# Patient Record
Sex: Female | Born: 1975
Health system: Southern US, Community
[De-identification: ages and names within clinical notes are randomized; demographics above are authoritative.]

## PROBLEM LIST (undated history)

## (undated) DIAGNOSIS — K219 Gastro-esophageal reflux disease without esophagitis: Secondary | ICD-10-CM

## (undated) DIAGNOSIS — Z9289 Personal history of other medical treatment: Secondary | ICD-10-CM

## (undated) DIAGNOSIS — M1990 Unspecified osteoarthritis, unspecified site: Secondary | ICD-10-CM

## (undated) DIAGNOSIS — I10 Essential (primary) hypertension: Secondary | ICD-10-CM

## (undated) DIAGNOSIS — F32A Depression, unspecified: Secondary | ICD-10-CM

## (undated) DIAGNOSIS — G4730 Sleep apnea, unspecified: Secondary | ICD-10-CM

## (undated) DIAGNOSIS — F329 Major depressive disorder, single episode, unspecified: Secondary | ICD-10-CM

## (undated) DIAGNOSIS — L508 Other urticaria: Secondary | ICD-10-CM

## (undated) DIAGNOSIS — N611 Abscess of the breast and nipple: Secondary | ICD-10-CM

## (undated) DIAGNOSIS — D219 Benign neoplasm of connective and other soft tissue, unspecified: Secondary | ICD-10-CM

## (undated) DIAGNOSIS — F419 Anxiety disorder, unspecified: Secondary | ICD-10-CM

## (undated) DIAGNOSIS — G43909 Migraine, unspecified, not intractable, without status migrainosus: Secondary | ICD-10-CM

## (undated) DIAGNOSIS — M069 Rheumatoid arthritis, unspecified: Secondary | ICD-10-CM

## (undated) DIAGNOSIS — K208 Other esophagitis: Secondary | ICD-10-CM

## (undated) DIAGNOSIS — H6690 Otitis media, unspecified, unspecified ear: Secondary | ICD-10-CM

## (undated) DIAGNOSIS — Z8489 Family history of other specified conditions: Secondary | ICD-10-CM

## (undated) HISTORY — PX: UTERINE FIBROID SURGERY: SHX826

## (undated) HISTORY — DX: Other esophagitis: K20.8

## (undated) HISTORY — DX: Other urticaria: L50.8

## (undated) HISTORY — PX: BREAST SURGERY: SHX581

## (undated) HISTORY — DX: Rheumatoid arthritis, unspecified: M06.9

## (undated) HISTORY — DX: Migraine, unspecified, not intractable, without status migrainosus: G43.909

## (undated) HISTORY — DX: Benign neoplasm of connective and other soft tissue, unspecified: D21.9

## (undated) HISTORY — PX: HERNIA REPAIR: SHX51

## (undated) HISTORY — DX: Unspecified osteoarthritis, unspecified site: M19.90

## (undated) HISTORY — DX: Abscess of the breast and nipple: N61.1

## (undated) HISTORY — DX: Otitis media, unspecified, unspecified ear: H66.90

---

## 1998-10-08 ENCOUNTER — Other Ambulatory Visit: Admission: RE | Admit: 1998-10-08 | Discharge: 1998-10-08 | Payer: Self-pay | Admitting: *Deleted

## 1999-11-01 ENCOUNTER — Other Ambulatory Visit: Admission: RE | Admit: 1999-11-01 | Discharge: 1999-11-01 | Payer: Self-pay | Admitting: *Deleted

## 2000-02-03 ENCOUNTER — Emergency Department (HOSPITAL_COMMUNITY): Admission: EM | Admit: 2000-02-03 | Discharge: 2000-02-04 | Payer: Self-pay | Admitting: Emergency Medicine

## 2000-11-15 ENCOUNTER — Other Ambulatory Visit: Admission: RE | Admit: 2000-11-15 | Discharge: 2000-11-15 | Payer: Self-pay | Admitting: *Deleted

## 2000-11-18 ENCOUNTER — Emergency Department (HOSPITAL_COMMUNITY): Admission: EM | Admit: 2000-11-18 | Discharge: 2000-11-18 | Payer: Self-pay | Admitting: Emergency Medicine

## 2000-11-29 ENCOUNTER — Encounter: Admission: RE | Admit: 2000-11-29 | Discharge: 2000-11-29 | Payer: Self-pay

## 2001-08-08 ENCOUNTER — Ambulatory Visit (HOSPITAL_BASED_OUTPATIENT_CLINIC_OR_DEPARTMENT_OTHER): Admission: RE | Admit: 2001-08-08 | Discharge: 2001-08-08 | Payer: Self-pay | Admitting: General Surgery

## 2001-09-18 HISTORY — PX: INCISION AND DRAINAGE BREAST ABSCESS: SUR672

## 2001-09-19 ENCOUNTER — Encounter: Payer: Self-pay | Admitting: General Surgery

## 2001-09-19 ENCOUNTER — Encounter: Admission: RE | Admit: 2001-09-19 | Discharge: 2001-09-19 | Payer: Self-pay | Admitting: General Surgery

## 2002-02-03 ENCOUNTER — Other Ambulatory Visit: Admission: RE | Admit: 2002-02-03 | Discharge: 2002-02-03 | Payer: Self-pay | Admitting: *Deleted

## 2003-02-09 ENCOUNTER — Other Ambulatory Visit: Admission: RE | Admit: 2003-02-09 | Discharge: 2003-02-09 | Payer: Self-pay | Admitting: *Deleted

## 2003-09-04 ENCOUNTER — Emergency Department (HOSPITAL_COMMUNITY): Admission: EM | Admit: 2003-09-04 | Discharge: 2003-09-04 | Payer: Self-pay | Admitting: Emergency Medicine

## 2004-04-29 ENCOUNTER — Ambulatory Visit (HOSPITAL_COMMUNITY): Admission: RE | Admit: 2004-04-29 | Discharge: 2004-04-29 | Payer: Self-pay | Admitting: *Deleted

## 2004-04-29 ENCOUNTER — Encounter (INDEPENDENT_AMBULATORY_CARE_PROVIDER_SITE_OTHER): Payer: Self-pay | Admitting: *Deleted

## 2004-11-01 ENCOUNTER — Ambulatory Visit: Payer: Self-pay | Admitting: Internal Medicine

## 2005-03-01 ENCOUNTER — Ambulatory Visit: Payer: Self-pay | Admitting: Internal Medicine

## 2005-04-14 ENCOUNTER — Ambulatory Visit: Payer: Self-pay | Admitting: Internal Medicine

## 2005-05-18 ENCOUNTER — Ambulatory Visit: Payer: Self-pay | Admitting: Internal Medicine

## 2005-05-24 ENCOUNTER — Encounter: Admission: RE | Admit: 2005-05-24 | Discharge: 2005-05-24 | Payer: Self-pay | Admitting: Orthopedic Surgery

## 2005-07-24 ENCOUNTER — Ambulatory Visit: Payer: Self-pay | Admitting: Internal Medicine

## 2005-09-05 ENCOUNTER — Ambulatory Visit: Payer: Self-pay | Admitting: Internal Medicine

## 2005-09-06 ENCOUNTER — Encounter: Admission: RE | Admit: 2005-09-06 | Discharge: 2005-09-06 | Payer: Self-pay | Admitting: Internal Medicine

## 2005-10-04 ENCOUNTER — Ambulatory Visit: Payer: Self-pay | Admitting: Internal Medicine

## 2005-10-30 ENCOUNTER — Ambulatory Visit: Payer: Self-pay | Admitting: Internal Medicine

## 2006-05-11 ENCOUNTER — Ambulatory Visit (HOSPITAL_COMMUNITY): Admission: RE | Admit: 2006-05-11 | Discharge: 2006-05-11 | Payer: Self-pay | Admitting: Obstetrics and Gynecology

## 2006-06-10 ENCOUNTER — Encounter: Admission: RE | Admit: 2006-06-10 | Discharge: 2006-06-10 | Payer: Self-pay | Admitting: Orthopedic Surgery

## 2006-08-21 ENCOUNTER — Ambulatory Visit: Payer: Self-pay | Admitting: Internal Medicine

## 2007-04-02 ENCOUNTER — Ambulatory Visit: Payer: Self-pay | Admitting: Internal Medicine

## 2007-07-24 ENCOUNTER — Ambulatory Visit: Payer: Self-pay | Admitting: Internal Medicine

## 2007-07-24 DIAGNOSIS — I10 Essential (primary) hypertension: Secondary | ICD-10-CM | POA: Insufficient documentation

## 2007-07-24 DIAGNOSIS — R51 Headache: Secondary | ICD-10-CM | POA: Insufficient documentation

## 2007-07-24 DIAGNOSIS — E669 Obesity, unspecified: Secondary | ICD-10-CM | POA: Insufficient documentation

## 2007-07-24 DIAGNOSIS — D259 Leiomyoma of uterus, unspecified: Secondary | ICD-10-CM | POA: Insufficient documentation

## 2007-07-24 DIAGNOSIS — R519 Headache, unspecified: Secondary | ICD-10-CM | POA: Insufficient documentation

## 2007-07-24 DIAGNOSIS — G4700 Insomnia, unspecified: Secondary | ICD-10-CM | POA: Insufficient documentation

## 2007-07-25 ENCOUNTER — Encounter: Payer: Self-pay | Admitting: Internal Medicine

## 2007-10-15 ENCOUNTER — Ambulatory Visit: Payer: Self-pay | Admitting: Internal Medicine

## 2007-10-15 DIAGNOSIS — N946 Dysmenorrhea, unspecified: Secondary | ICD-10-CM | POA: Insufficient documentation

## 2007-11-05 ENCOUNTER — Ambulatory Visit: Payer: Self-pay | Admitting: Internal Medicine

## 2007-11-10 LAB — CONVERTED CEMR LAB
BUN: 7 mg/dL (ref 6–23)
Calcium: 9.5 mg/dL (ref 8.4–10.5)

## 2007-11-13 ENCOUNTER — Ambulatory Visit: Payer: Self-pay | Admitting: Internal Medicine

## 2007-11-13 DIAGNOSIS — M5380 Other specified dorsopathies, site unspecified: Secondary | ICD-10-CM | POA: Insufficient documentation

## 2008-04-27 ENCOUNTER — Telehealth: Payer: Self-pay | Admitting: *Deleted

## 2008-04-27 ENCOUNTER — Ambulatory Visit: Payer: Self-pay | Admitting: Family Medicine

## 2008-05-11 ENCOUNTER — Ambulatory Visit: Payer: Self-pay | Admitting: Internal Medicine

## 2009-10-28 ENCOUNTER — Telehealth: Payer: Self-pay | Admitting: Internal Medicine

## 2009-10-29 ENCOUNTER — Ambulatory Visit: Payer: Self-pay | Admitting: Internal Medicine

## 2009-10-29 DIAGNOSIS — R042 Hemoptysis: Secondary | ICD-10-CM | POA: Insufficient documentation

## 2009-10-29 LAB — CONVERTED CEMR LAB
ALT: 14 units/L (ref 0–35)
AST: 20 units/L (ref 0–37)
Albumin: 3.3 g/dL — ABNORMAL LOW (ref 3.5–5.2)
Basophils Relative: 0 % (ref 0.0–3.0)
Bilirubin, Direct: 0.2 mg/dL (ref 0.0–0.3)
CO2: 28 meq/L (ref 19–32)
Calcium: 8.5 mg/dL (ref 8.4–10.5)
GFR calc non Af Amer: 91.99 mL/min (ref >60)
Glucose, Bld: 96 mg/dL (ref 70–99)
Lymphocytes Relative: 39 % (ref 12.0–46.0)
MCHC: 32.7 g/dL (ref 30.0–36.0)
MCV: 92 fL (ref 78.0–100.0)
Monocytes Relative: 16 % — ABNORMAL HIGH (ref 3.0–12.0)
Platelets: 322 10*3/uL (ref 150.0–400.0)
RDW: 12.7 % (ref 11.5–14.6)
WBC: 4.9 10*3/uL (ref 4.5–10.5)

## 2009-11-01 ENCOUNTER — Telehealth: Payer: Self-pay | Admitting: *Deleted

## 2009-11-08 ENCOUNTER — Telehealth: Payer: Self-pay | Admitting: Internal Medicine

## 2009-11-10 ENCOUNTER — Ambulatory Visit: Payer: Self-pay | Admitting: Internal Medicine

## 2009-11-10 DIAGNOSIS — J019 Acute sinusitis, unspecified: Secondary | ICD-10-CM | POA: Insufficient documentation

## 2010-02-12 ENCOUNTER — Emergency Department (HOSPITAL_COMMUNITY): Admission: EM | Admit: 2010-02-12 | Discharge: 2010-02-13 | Payer: Self-pay | Admitting: Emergency Medicine

## 2010-07-28 ENCOUNTER — Ambulatory Visit: Payer: Self-pay | Admitting: Internal Medicine

## 2010-07-28 DIAGNOSIS — R209 Unspecified disturbances of skin sensation: Secondary | ICD-10-CM | POA: Insufficient documentation

## 2010-07-28 DIAGNOSIS — R229 Localized swelling, mass and lump, unspecified: Secondary | ICD-10-CM | POA: Insufficient documentation

## 2010-08-09 ENCOUNTER — Encounter: Payer: Self-pay | Admitting: Internal Medicine

## 2010-10-20 NOTE — Assessment & Plan Note (Signed)
Summary: ? cyst on elbow?/dm   Vital Signs:  Patient profile:   35 year old female Menstrual status:  irregular LMP:     07/02/2010 Height:      64 inches Weight:      290 pounds BMI:     49.96 Pulse rate:   66 / minute BP sitting:   140 / 90  (right arm) Cuff size:   large  Vitals Entered By: Romualdo Bolk, CMA (AAMA) (July 28, 2010 8:09 AM) CC: Cyst on left elbow, starting to get painful. It has been growing and getting worse the past year. Pt states that if she rest on her elbow her arm gets numb from elbow down to fingers. LMP (date): 07/02/2010     Menstrual Status irregular Enter LMP: 07/02/2010   History of Present Illness: Gisele Pack comes in today  for  above problem , Onset months ago and slowly growing nodule that is painful in   left elbow region. Hurts to touch and also some radiating symptoms down arm and nubness at time in pinky and ring finger .  does tende to lean on that elbow and sleep in flexed positon.  NO trauma and is right  handed.    No other weakness or tingling  HT controlled  up  today from" stress" HAs   would need refill of imitrex    takes 2 of whatever dose ( given by HA specialist in past )    NO cp sob.    Preventive Screening-Counseling & Management  Alcohol-Tobacco     Alcohol drinks/day: <1     Smoking Status: never  Caffeine-Diet-Exercise     Caffeine use/day: 2     Does Patient Exercise: no  Current Medications (verified): 1)  Maxalt-Mlt 5 Mg  Tbdp (Rizatriptan Benzoate) .... As Needed Migraine 2)  Imitrex 25 Mg  Tabs (Sumatriptan Succinate) .... As Needed Migraines 3)  Doxepin Hcl 10 Mg  Caps (Doxepin Hcl) .... As Needed For Hives 4)  Rozerem 8 Mg  Tabs (Ramelteon) .[name].. 1 Hs  For Sleep 5)  Flexeril 10 Mg  Tabs (Cyclobenzaprine Hcl) .[name].. 1 By Mouth Three Times A Day As Needed 6)  Diovan Hct 160-25 Mg Tabs (Valsartan-Hydrochlorothiazide) .[name].. 1 By Mouth Once Daily  Allergies (verified): No Known Drug  Allergies  Past History:  Past medical, surgical, family and social histories (including risk factors) reviewed, and no changes noted (except as noted below).  Past Medical History: Reviewed history from 04/27/2008 and no changes required. recurrent breast abscess Headache dr Jarvis Morgan fibroids and bleeding hives hx of when gets hot Hypertension  Past History:  Care Management: Gynecology:  hx of HA clinic eval in past   Family History: Reviewed history from 07/24/2007 and no changes required. Family History of Arthritis Family History Hypertension Family History of Stroke M 1st degree relative <50  Social History: Reviewed history from 11/10/2009 and no changes required. Never Smoked Single no tobacco employed  travels some in car  Review of Systems  The patient denies anorexia, fever, weight loss, difficulty walking, abnormal bleeding, enlarged lymph nodes, and angioedema.         headaches stable     no new signs   Physical Exam  General:  Well-developed,well-nourished,in no acute distress; alert,appropriate and cooperative throughout examination Msk:  left elbow with 2 cm mobile tender  cytic feeling just above elbow  nealr lateral olecrenon  no redness   grip strength is normal but painful to grip no  atrophy and finger opposition strength is normal  Pulses:  pulses intact without delay   Extremities:  see above  Neurologic:  alert & oriented X3, strength normal in all extremities, and gait normal.   Skin:  turgor normal and color normal.   Cervical Nodes:  No lymphadenopathy noted Psych:  Oriented X3, good eye contact, not anxious appearing, and not depressed appearing.     Impression & Recommendations:  Problem # 1:  LOCALIZED SUPERFICIAL SWELLING MASS OR LUMP (ICD-782.2)  seems like skin cyst but tender without redness or infection and  not a sebaceous type   with radiating pain numbness to ulnar area  of hand .  consider nerve comporession.    Orders: Orthopedic Surgeon Referral (Ortho Surgeon)  Problem # 2:  PARESTHESIA (ICD-782.0)  seems ? ulnar nerve from elbow .  no weakness  on exam   Orders: Orthopedic Surgeon Referral (Ortho Surgeon)  Problem # 3:  HEADACHE (ICD-784.0) on going  ..control mostly by "2 imitrex but thinks it is 50 mg )  will rx for 100 mg and stick to one triptan at a time.   Her updated medication list for this problem includes:    Maxalt-mlt 5 Mg Tbdp (Rizatriptan benzoate) .[name]... As needed migraine    Imitrex 25 Mg Tabs (Sumatriptan succinate) .[name]... As needed migraines    Sumatriptan Succinate 100 Mg Tabs (Sumatriptan succinate) .[name]... 1 by mouth as needed ha and can repeat in 2-4 hours  Problem # 4:  HYPERTENSION (ICD-401.9) has been controlled  says up today because of stress . to monitor  Her updated medication list for this problem includes:    Diovan Hct 160-25 Mg Tabs (Valsartan-hydrochlorothiazide) .[name]... 1 by mouth once daily  Complete Medication List: 1)  Maxalt-mlt 5 Mg Tbdp (Rizatriptan benzoate) .... As needed migraine 2)  Imitrex 25 Mg Tabs (Sumatriptan succinate) .... As needed migraines 3)  Doxepin Hcl 10 Mg Caps (Doxepin hcl) .... As needed for hives 4)  Rozerem 8 Mg Tabs (Ramelteon) .[name].. 1 hs  for sleep 5)  Flexeril 10 Mg Tabs (Cyclobenzaprine hcl) .[name].. 1 by mouth three times a day as needed 6)  Diovan Hct 160-25 Mg Tabs (Valsartan-hydrochlorothiazide) .[name].. 1 by mouth once daily 7)  Sumatriptan Succinate 100 Mg Tabs (Sumatriptan succinate) .[name].. 1 by mouth as needed ha and can repeat in 2-4 hours  Patient Instructions: 1)  Will contact your about referral to hand specialist.  2)  In the meantime avoid trauma   and extreme flexion. 3)  Continue  monitoring your  Blood pressure  and call if increase  Prescriptions: SUMATRIPTAN SUCCINATE 100 MG TABS (SUMATRIPTAN SUCCINATE) 1 by mouth as needed HA and can repeat in 2-4 hours  #9 x 1   Entered and Authorized by:   Madelin Headings  MD   Signed by:   Madelin Headings MD on 07/28/2010   Method used:   [facility] [address] [id number]       [address]       [place], [place]  [address]       Ph: [phone]       Fax: [phone]   RxID:   [id number]    Orders Added: 1)  Est. Patient Level IV [27253] 2)  Orthopedic Surgeon Referral Gaylord Shih Surgeon]

## 2010-10-20 NOTE — Assessment & Plan Note (Signed)
Summary: PT ADV FLU-LIKE SXS // RS   Vital Signs:  Patient profile:   35 year old female Menstrual status:  regular LMP:     10/21/2009 Height:      64 inches Weight:      273 pounds BMI:     47.03 O2 Sat:      98 % on Room air Temp:     98.7 degrees F oral Pulse rate:   97 / minute BP sitting:   120 / 80  (right arm) Cuff size:   large  Vitals Entered By: Romualdo Bolk, CMA (AAMA) (October 29, 2009 8:10 AM)  O2 Flow:  Room air CC: Sob, body aches, coughing , chest pains due to coughing and fever that started on 2/8. LMP (date): 10/21/2009     Menstrual Status regular Enter LMP: 10/21/2009   History of Present Illness: Jennifer [name] comesin today for   acute onset of above symptom  Was sick  end of dec and then jan  and went to urgent care.  Was told she had the flu with   Fever and laryngitis   .   Better From  Jan4 .   This illness began 2 days ago.      with ur st    then fever  Feb 8 th. days ago and then 102 and 103.  sudden onset.    Some body aches and  chest hurts.  No tobacco  no asthma.  DOE.   Trying   ther flu and alka setlzer cold plus.  and Mucinex.   .   Coughed up some blood red  this am. no nosebleed.    Preventive Screening-Counseling & Management  Alcohol-Tobacco     Alcohol drinks/day: <1     Smoking Status: never  Caffeine-Diet-Exercise     Caffeine use/day: 2     Does Patient Exercise: no  Current Medications (verified): 1)  Maxalt-Mlt 5 Mg  Tbdp (Rizatriptan Benzoate) .... As Needed Migraine 2)  Imitrex 25 Mg  Tabs (Sumatriptan Succinate) .... As Needed Migraines 3)  Doxepin Hcl 10 Mg  Caps (Doxepin Hcl) .... As Needed For Hives 4)  Rozerem 8 Mg  Tabs (Ramelteon) .[name].. 1 Hs  For Sleep 5)  Flexeril 10 Mg  Tabs (Cyclobenzaprine Hcl) .[name].. 1 By Mouth Three Times A Day As Needed 6)  Diovan Hct 160-25 Mg Tabs (Valsartan-Hydrochlorothiazide) .[name].. 1 By Mouth Once Daily  Allergies (verified): No Known Drug Allergies  Past History:  Past  Medical History: Reviewed history from 04/27/2008 and no changes required. recurrent breast abscess Headache dr Jarvis Morgan fibroids and bleeding hives hx of when gets hot Hypertension  Past History:  Care Management: Gynecology: [facility]  Social History: Caffeine use/day:  2 Does Patient Exercise:  no  Review of Systems       The patient complains of anorexia, fever, hoarseness, chest pain, dyspnea on exertion, and hemoptysis.  The patient denies weight gain, vision loss, syncope, peripheral edema, prolonged cough, abdominal pain, melena, hematochezia, severe indigestion/heartburn, hematuria, muscle weakness, transient blindness, unusual weight change, abnormal bleeding, and enlarged lymph nodes.         gets  recurrent breast and axillary  cysts and infections  Physical Exam  General:  Well-developed,well-nourished,in no acute distress; alert,appropriate and cooperative throughout examination  hoarse and congested .  Head:  normocephalic and atraumatic.   Eyes:  vision grossly intact and pupils equal.   Ears:  R ear normal, L ear normal, and no external  deformities.   Nose:  no external deformity and no external erythema.  very congested  Mouth:  pharynx pink and moist.  minimal  erythema  Neck:  No deformities, masses, or tenderness noted. Lungs:  Normal respiratory effort, chest expands symmetrically. Lungs are clear to auscultation, no crackles or wheezes.no dullness.   Heart:  Normal rate and regular rhythm. S1 and S2 normal without gallop, murmur, click, rub or other extra sounds.no lifts.   Abdomen:  Bowel sounds positive,abdomen soft and non-tender without masses, organomegaly or   noted. Pulses:  pulses intact without delay   Neurologic:  non focal  Skin:  turgor normal, color normal, no ecchymoses, no petechiae, no purpura, and no ulcerations.   Cervical Nodes:  shoddy nodes  Psych:  Oriented X3, good eye contact, not anxious appearing, and not depressed appearing.      Impression & Recommendations:  Problem # 1:  HEMOPTYSIS UNSPECIFIED (ICD-786.30) poss upper airway but patient concerned about severity of her illness and "immune  system"   no unusual exposures otherwise .  get cxray today also.  Orders: TLB-CBC Platelet - w/Differential (85025-CBCD) TLB-BMP (Basic Metabolic Panel-BMET) (80048-METABOL) TLB-Hepatic/Liver Function Pnl (80076-HEPATIC) Venipuncture (14782) T-2 View CXR (71020TC)  Problem # 2:  FEVER (ICD-780.60) with RTI coughing illness   we are in the middle of a flu epidemic but she already"had the flu" in december and had the immunization.  Orders: TLB-CBC Platelet - w/Differential (85025-CBCD) TLB-BMP (Basic Metabolic Panel-BMET) (80048-METABOL) TLB-Hepatic/Liver Function Pnl (80076-HEPATIC) Venipuncture (95621) T-2 View CXR (71020TC)  Problem # 3:  HYPERTENSION (ICD-401.9)  Her updated medication list for this problem includes:    Diovan Hct 160-25 Mg Tabs (Valsartan-hydrochlorothiazide) .[name]... 1 by mouth once daily  Complete Medication List: 1)  Maxalt-mlt 5 Mg Tbdp (Rizatriptan benzoate) .... As needed migraine 2)  Imitrex 25 Mg Tabs (Sumatriptan succinate) .... As needed migraines 3)  Doxepin Hcl 10 Mg Caps (Doxepin hcl) .... As needed for hives 4)  Rozerem 8 Mg Tabs (Ramelteon) .[name].. 1 hs  for sleep 5)  Flexeril 10 Mg Tabs (Cyclobenzaprine hcl) .[name].. 1 by mouth three times a day as needed 6)  Diovan Hct 160-25 Mg Tabs (Valsartan-hydrochlorothiazide) .[name].. 1 by mouth once daily 7)  Hydromet 5-1.5 Mg/75ml Syrp (Hydrocodone-homatropine) .[name].. 1-2 tsp by mouth q4-6 hours as needed cough  Patient Instructions: 1)  You will be informed of lab results when available.  2)  if x ray nega tive continue symptom rx  3)  (If fever goes high tonight or  cough up blood then take the antibiotic ).)  can call the oncall service if progressively worse. Prescriptions: HYDROMET 5-1.5 MG/5ML SYRP (HYDROCODONE-HOMATROPINE) 1-2 tsp by mouth  q4-6 hours as needed cough  #6 oz x 0   Entered and Authorized by:   Madelin Headings MD   Signed by:   Madelin Headings MD on 10/29/2009   Method used:   Print then Give to Patient   RxID:   [id number]

## 2010-10-20 NOTE — Progress Notes (Signed)
Summary: ov  Phone Note Call from Patient   Caller: Patient Call For: Jennifer Headings MD Summary of Call: sick with croupy cough, body aches. Out-of town today, asking for appt tomorrow. Initial call taken by: Raechel Ache, RN,  October 28, 2009 11:08 AM  Follow-up for Phone Call        [facility] to call back for appt Follow-up by: Raechel Ache, RN,  October 28, 2009 11:08 AM  Additional Follow-up for Phone Call Additional follow up Details #1::        Scheduled for 2-11. Additional Follow-up by: Rudy Jew, RN,  October 29, 2009 8:35 AM

## 2010-10-20 NOTE — Assessment & Plan Note (Signed)
Summary: fever 100.6, and cough is unbearable/dm   Vital Signs:  Patient profile:   35 year old female Menstrual status:  regular LMP:     10/21/2009 Weight:      275 pounds O2 Sat:      98 % on Room air Temp:     98.4 degrees F oral Pulse rate:   86 / minute BP sitting:   110 / 70  (left arm) Cuff size:   large  Vitals Entered By: Romualdo Bolk, CMA (AAMA) (November 10, 2009 3:31 PM)  O2 Flow:  Room air CC: Fever going up and down, coughing bad. Slight diarrhea, decreased appitite. Left eye swollen pink and red on 2/21 and 22 but better today.  LMP (date): 10/21/2009     Enter LMP: 10/21/2009   History of Present Illness: Jennifer [name] comesin today for   because  getting worse again. cough never that much better  and then got fever again.   finished z pack as directed   clogged left nostril for 5 days and left eye  red and irritated at times recently .fever x 1 day or so . Coughin now until vomits a bit  no sob .  no cp otherwise.  No new rashes  uti nv  some loose stiool no blood.   Has never been well since New year ( see notes)    No new rashes   NOt taking doxepin now.  Preventive Screening-Counseling & Management  Alcohol-Tobacco     Alcohol drinks/day: <1     Smoking Status: never  Caffeine-Diet-Exercise     Caffeine use/day: 2     Does Patient Exercise: no  Current Medications (verified): 1)  Maxalt-Mlt 5 Mg  Tbdp (Rizatriptan Benzoate) .... As Needed Migraine 2)  Imitrex 25 Mg  Tabs (Sumatriptan Succinate) .... As Needed Migraines 3)  Doxepin Hcl 10 Mg  Caps (Doxepin Hcl) .... As Needed For Hives 4)  Rozerem 8 Mg  Tabs (Ramelteon) .[name].. 1 Hs  For Sleep 5)  Flexeril 10 Mg  Tabs (Cyclobenzaprine Hcl) .[name].. 1 By Mouth Three Times A Day As Needed 6)  Diovan Hct 160-25 Mg Tabs (Valsartan-Hydrochlorothiazide) .[name].. 1 By Mouth Once Daily 7)  Hydromet 5-1.5 Mg/98ml Syrp (Hydrocodone-Homatropine) .[name].. 1-2 Tsp By Mouth Q4-6 Hours As Needed Cough  Allergies  (verified): No Known Drug Allergies  Past History:  Past medical, surgical, family and social histories (including risk factors) reviewed, and no changes noted (except as noted below).  Past Medical History: Reviewed history from 04/27/2008 and no changes required. recurrent breast abscess Headache dr Jarvis Morgan fibroids and bleeding hives hx of when gets hot Hypertension  Past History:  Care Management: Gynecology: [facility]  Family History: Reviewed history from 07/24/2007 and no changes required. Family History of Arthritis Family History Hypertension Family History of Stroke M 1st degree relative <50  Social History: Reviewed history from 05/11/2008 and no changes required. Never Smoked Single no tobacco employed   Review of Systems       The patient complains of anorexia, fever, decreased hearing, hoarseness, prolonged cough, and headaches.  The patient denies chest pain, syncope, dyspnea on exertion, abdominal pain, hematochezia, severe indigestion/heartburn, hematuria, muscle weakness, transient blindness, difficulty walking, unusual weight change, abnormal bleeding, enlarged lymph nodes, and angioedema.    Physical Exam  General:  very congested hoarse in nad looks exhauseted  Head:  normocephalic and atraumatic.   Eyes:  no swelling minimal redness no dc  eoms nl  Ears:  R ear normal, L ear normal, and no external deformities.   Nose:  no external deformity and no external erythema.  very congested left more than right  slight tenderness left maxilla  Mouth:  midl erythema no lesion no edema Neck:  No deformities, masses, or tenderness noted. Lungs:  Normal respiratory effort, chest expands symmetrically. Lungs are clear to auscultation, no crackles or wheezes.no dullness.   Heart:  Normal rate and regular rhythm. S1 and S2 normal without gallop, murmur, click, rub or other extra sounds.no lifts.   Abdomen:  Bowel sounds positive,abdomen soft and non-tender without  masses, organomegaly or  noted. Pulses:  nl cap refill  Skin:  turgor normal, color normal, no ecchymoses, and no petechiae.   Cervical Nodes:  shoddy  Psych:  Oriented X3, not anxious appearing, and not depressed appearing.  tired    Impression & Recommendations:  Problem # 1:  SINUSITIS - ACUTE-NOS (ICD-461.9)  left maxillary sinusitis?    very prolonged   illness   last cxray and lab ok except some neurtopenia . Mono less likely as no adenopathy and predominant cough.      If not getting better consider sinus ct  and or further evaluation.   The following medications were removed from the medication list:    Zithromax Z-pak 250 Mg Tabs (Azithromycin) .[name]... Take as directed Her updated medication list for this problem includes:    Hydromet 5-1.5 Mg/67ml Syrp (Hydrocodone-homatropine) .[name]... 1-2 tsp by mouth q4-6 hours as needed cough    Levaquin 750 Mg Tabs (Levofloxacin) .[name]... 1 by mouth once daily for 7 days for sinusitis caution levaquin with doxepin qt interval.  Orders: Prescription Created Electronically [phone])  Problem # 2:  cough uri  prolonged waxing and waning over almost 2 months by her history .  .   chest x ray NAD done in the last few weeks.       Complete Medication List: 1)  Maxalt-mlt 5 Mg Tbdp (Rizatriptan benzoate) .... As needed migraine 2)  Imitrex 25 Mg Tabs (Sumatriptan succinate) .... As needed migraines 3)  Doxepin Hcl 10 Mg Caps (Doxepin hcl) .... As needed for hives 4)  Rozerem 8 Mg Tabs (Ramelteon) .[name].. 1 hs  for sleep 5)  Flexeril 10 Mg Tabs (Cyclobenzaprine hcl) .[name].. 1 by mouth three times a day as needed 6)  Diovan Hct 160-25 Mg Tabs (Valsartan-hydrochlorothiazide) .[name].. 1 by mouth once daily 7)  Hydromet 5-1.5 Mg/35ml Syrp (Hydrocodone-homatropine) .[name].. 1-2 tsp by mouth q4-6 hours as needed cough 8)  Levaquin 750 Mg Tabs (Levofloxacin) .[name].. 1 by mouth once daily for 7 days for sinusitis  Patient Instructions: 1)  afrin nose spray for 3 days left  side of nose and nasal saline . 2)  antibioic for sinusitis. 3)  Call  in 5 days about your progress or as needed.  we will then decide any more follow up if needed.  4)  fever should be gone in 48 hours .  Prescriptions: LEVAQUIN 750 MG TABS (LEVOFLOXACIN) 1 by mouth once daily for 7 days for sinusitis  #7 x 0   Entered and Authorized by:   Madelin Headings MD   Signed by:   Madelin Headings MD on 11/10/2009   Method used:   [facility]        [facility]  [address] [id number]       [address]       [place]  [place], [place]  [address]       Ph: [phone]       Fax: [phone]   RxID:   [id number]

## 2010-10-20 NOTE — Progress Notes (Signed)
Summary: still sick  Phone Note Call from Patient   Caller: Patient Call For: Madelin Headings MD Reason for Call: Insurance Question Summary of Call: Still sick with congestion, dry cough and sore chest- no fever. [facility] work [phone] Initial call taken by: Raechel Ache, RN,  November 08, 2009 8:49 AM  Follow-up for Phone Call        this could be  post viral    airway irritability. If no fever pain or wheezing  wait another week and if not better then rov. Ok to  refill hydrocodone cough med if wishes   Follow-up by: Madelin Headings MD,  November 08, 2009 12:23 PM  Additional Follow-up for Phone Call Additional follow up Details #1::        Rx Called In Additional Follow-up by: Raechel Ache, RN,  November 08, 2009 12:55 PM    Prescriptions: HYDROMET 5-1.5 MG/5ML SYRP (HYDROCODONE-HOMATROPINE) 1-2 tsp by mouth q4-6 hours as needed cough  #6 oz x 0   Entered by:   Raechel Ache, RN   Authorized by:   Madelin Headings MD   Signed by:   Raechel Ache, RN on 11/08/2009   Method used:   Historical   RxID:   [id number]

## 2010-10-20 NOTE — Progress Notes (Signed)
Summary: test results  Phone Note Call from Patient   Caller: Patient Call For: Madelin Headings MD Summary of Call: calling for test results Initial call taken by: Raechel Ache, RN,  November 01, 2009 8:29 AM  Follow-up for Phone Call        [facility] Follow-up by: Romualdo Bolk, CMA Duncan Dull),  November 01, 2009 2:09 PM  Additional Follow-up for Phone Call Additional follow up Details #1::        Pt aware of results. Additional Follow-up by: Romualdo Bolk, CMA (AAMA),  November 02, 2009 8:20 AM

## 2010-10-20 NOTE — Letter (Signed)
Summary: Out of Work  Adult nurse at [employer]  [address]   [place], [place] [address]   Phone: [phone]  Fax: [phone]    November 10, 2009   Employee:  Jennifer [name]    To Whom It May Concern:   For Medical reasons, please excuse the above named employee from work for the following dates:  Start:   Wendnesday  Nov 10 2008  End:   Monday Nov 15 2008  If you need additional information, please feel free to contact our office.         Sincerely,    Madelin Headings MD

## 2010-10-20 NOTE — Consult Note (Signed)
Summary: [facility]  [facility]   Imported By: Maryln Gottron 08/25/2010 12:13:44  _____________________________________________________________________  External Attachment:    Type:   Image     Comment:   External Document

## 2010-11-03 ENCOUNTER — Encounter: Payer: Self-pay | Admitting: Internal Medicine

## 2010-11-03 ENCOUNTER — Ambulatory Visit (INDEPENDENT_AMBULATORY_CARE_PROVIDER_SITE_OTHER): Payer: Commercial Managed Care - PPO | Admitting: Internal Medicine

## 2010-11-03 DIAGNOSIS — J370 Chronic laryngitis: Secondary | ICD-10-CM | POA: Insufficient documentation

## 2010-11-03 DIAGNOSIS — J309 Allergic rhinitis, unspecified: Secondary | ICD-10-CM | POA: Insufficient documentation

## 2010-11-03 DIAGNOSIS — L509 Urticaria, unspecified: Secondary | ICD-10-CM

## 2010-11-08 ENCOUNTER — Other Ambulatory Visit: Payer: Self-pay | Admitting: Otolaryngology

## 2010-11-08 DIAGNOSIS — R490 Dysphonia: Secondary | ICD-10-CM

## 2010-11-15 NOTE — Assessment & Plan Note (Signed)
Summary: laryngitis/cb   Primary Provider/Referring Provider:  Panosh  CC:  Acute visit-Laryngitis off and on x 6 months; chills, nose sores, and and bloody nose at times..  History of Present Illness: November 03, 2010- 34 yoF previously seen here in 2006 for urticaria and allergic rhinoconjunctivitis, then lost to f/u. She was skin tested, broadly positive. Those resultsare reviewed and entered in EMR.  She comes now concerned about recurrent laryngitis with loss of voice intermittently x 6 months. She doesn't recognize pattern or trigger and denies postnasal drip or acid reflux. Denies stridor, throat pain or dysphagia. Occasional raspy cough, not constant. Notices nasal congestion in the morning and aware of snoring. Lives alone, noting insomnia but not aware of sleep apnea. No hx of ENT surgery. Denies voice abuse.  Hives still occur occasionally, although she has been off BCPs which originally seemed to be the problem. Heat triggers.  Preventive Screening-Counseling & Management  Alcohol-Tobacco     Alcohol drinks/day: <1     Smoking Status: never  Current Medications (verified): 1)  Maxalt-Mlt 5 Mg  Tbdp (Rizatriptan Benzoate) .... As Needed Migraine 2)  Imitrex 25 Mg  Tabs (Sumatriptan Succinate) .... As Needed Migraines 3)  Doxepin Hcl 10 Mg  Caps (Doxepin Hcl) .... As Needed For Hives 4)  Rozerem 8 Mg  Tabs (Ramelteon) .[name].. 1 Hs  For Sleep 5)  Flexeril 10 Mg  Tabs (Cyclobenzaprine Hcl) .[name].. 1 By Mouth Three Times A Day As Needed 6)  Diovan Hct 160-25 Mg Tabs (Valsartan-Hydrochlorothiazide) .[name].. 1 By Mouth Once Daily 7)  Sumatriptan Succinate 100 Mg Tabs (Sumatriptan Succinate) .[name].. 1 By Mouth As Needed Ha and Can Repeat in 2-4 Hours  Allergies (verified): No Known Drug Allergies  Past History:  Family History: Last updated: 07/24/2007 Family History of Arthritis Family History Hypertension Family History of Stroke M 1st degree relative <50  Social History: Last  updated: 07/28/2010 Never Smoked Single no tobacco employed  travels some in car  Risk Factors: Alcohol Use: <1 (11/03/2010) Caffeine Use: 2 (07/28/2010) Exercise: no (07/28/2010)  Risk Factors: Smoking Status: never (11/03/2010)  Past Medical History: recurrent breast abscess Headache dr Jarvis Morgan fibroids and bleeding hives hx of when gets hot Hypertension Recurrent laryngitis  Past Surgical History: Abscess left breast 2003  Review of Systems      See HPI       The patient complains of nasal congestion/difficulty breathing through nose and rash.  The patient denies shortness of breath with activity, shortness of breath at rest, productive cough, non-productive cough, coughing up blood, chest pain, irregular heartbeats, acid heartburn, indigestion, loss of appetite, weight change, abdominal pain, difficulty swallowing, sore throat, tooth/dental problems, headaches, sneezing, ear ache, anxiety, depression, hand/feet swelling, joint stiffness or pain, change in color of mucus, and fever.    Vital Signs:  Patient profile:   35 year old female Menstrual status:  irregular Height:      64 inches Weight:      291.38 pounds BMI:     50.20 O2 Sat:      99 % on Room air Temp:     97.5 degrees F oral Pulse rate:   72 / minute BP sitting:   164 / 98  (right arm) Cuff size:   large  Vitals Entered By: Reynaldo Minium CMA (November 03, 2010 2:16 PM)  O2 Flow:  Room air CC: Acute visit-Laryngitis off and on x 6 months; chills, nose sores, and bloody nose at times.   Physical Exam  Additional Exam:  General: A/Ox3; pleasant and cooperative, NAD, obese SKIN: no rash, lesions NODES: no lymphadenopathy HEENT: [place]/AT, EOM- WNL, Conjuctivae- clear, PERRLA, TM-WNL, Nose- crusting, Throat- clear and wnl, hoarse, no stridor or erythema NECK: Supple w/ fair ROM, JVD- none, normal carotid impulses w/o bruits Thyroid- normal to palpation CHEST: Clear to P&A HEART: RRR, no m/g/r  heard ABDOMEN: Soft and nl; nml bowel sounds; no organomegaly or masses noted EAV:WUJW, nl pulses, no edema  NEURO: Grossly intact to observation      Impression & Recommendations:  Problem # 1:  CHRONIC LARYNGITIS (ICD-476.0)  She doesn't feel postnasal drip or reflux, but those are not exxcluded. We discussed indoor heat and nasal congestion with snoring. She lives alone and doesn't know about apnea. We will have her try nasal saline lavage and nasonex. It turns out she has ENT appointment with Dr Jenne Pane tomorrow, so he can look at her cords and perhaps solve  this. Intermittent hoarseness is less likely related to polyps, which she asked albout.   Problem # 2:  URTICARIA, RECURRENT (ICD-708.9) Still has intermittent chronic urticaria. Heat seems to be a trigger, or the most common one. She usually is controlled with doxepin, but finds that oversedating. We suggested nonsedating antihistamines now.  Other Orders: Consultation Level IV (11914)  Patient Instructions: 1)  Please schedule a follow-up appointment as needed. 2)  Keep appointment with Dr Jenne Pane. If he doesn't see anything wrong, then I will certainly see you back and work with you  3)  Try saline nasal rinse once or twice daily for a week or so- like with a Neti pot 4)  Try sample Nasonex nasal steroid spray 5)   1-2 sprays each nostril every night last bedtime 6)  Try otc antihistamine Allegra 180/ fexofenadine for the hives 7)

## 2010-11-15 NOTE — Miscellaneous (Signed)
Summary: Skin Test/[facility]  Skin Test/ HealthCare   Imported By: Sherian Rein 11/09/2010 11:58:19  _____________________________________________________________________  External Attachment:    Type:   Image     Comment:   External Document

## 2010-11-21 ENCOUNTER — Other Ambulatory Visit: Payer: Commercial Managed Care - PPO

## 2010-12-01 ENCOUNTER — Ambulatory Visit
Admission: RE | Admit: 2010-12-01 | Discharge: 2010-12-01 | Disposition: A | Discharge status: Home / Self Care | Payer: Commercial Managed Care - PPO | Source: Ambulatory Visit | Attending: Otolaryngology | Admitting: Otolaryngology

## 2010-12-01 DIAGNOSIS — R490 Dysphonia: Secondary | ICD-10-CM

## 2010-12-01 MED ORDER — IOHEXOL 300 MG/ML  SOLN
75.0000 mL | Freq: Once | INTRAMUSCULAR | Status: AC | PRN
  Administered 2010-12-01: 75 mL via INTRAVENOUS

## 2011-02-03 NOTE — Op Note (Signed)
[place]  Patient:    Jennifer [name], Jennifer [name] Visit Number: [id number] MRN: [id number]          Service Type: DSU Location: [facility] Attending Physician:  Caleen Essex Dictated by:   Chevis Pretty, M.D. Proc. Date: 08/08/01 Admit Date:  08/08/2001                             Operative Report  PREOPERATIVE DIAGNOSIS:  Left breast abscess.  POSTOPERATIVE DIAGNOSIS:  Left breast abscess.  OPERATION PERFORMED:  SURGEON:  Chevis Pretty, M.D.  ANESTHESIA:  General via LMA.  DESCRIPTION OF PROCEDURE:  After informed consent was obtained, the patient was brought to the operating room and placed in supine position on the operating table.  After adequate induction of general anesthesia, the patients left breast was prepped with Betadine and draped in the usual sterile manner.  An 18 gauge needle and syringe were used to localize the abscess and once this was done, an incision was made over top of the abscess with a 15 blade knife.  This incision was carried down through the skin and subcutaneous tissue using the Bovie electrocautery until the abscess cavity was entered.  Cultures were then taken.  A fair amount of purulent material was able to be expressed.  The wound was probed with a hemostat to break up any loculations.  Once this was complete, the wound was packed to clean it up. Once this was done, the packing was removed and any bleeding points in the cavity were coagulated with the Bovie electrocautery.  The wound was then packed with a sterile piece of gauze and sterile dressings were applied.  The patient tolerated the procedure well.  Sponge, needle and instrument counts were correct at the end of the case.  The patient was awakened and taken to the recovery room in stable condition. Dictated by:   Chevis Pretty, M.D. Attending Physician:  Caleen Essex DD:  08/08/01 TD:  08/08/01 Job: [id number]

## 2011-02-03 NOTE — Op Note (Signed)
NAME:  Jennifer, [name]                          ACCOUNT NO.:  [id number]   MEDICAL RECORD NO.:  [id number]                   PATIENT TYPE:  AMB   LOCATION:  DAY                                  FACILITY:  [facility]   PHYSICIAN:  Pershing Cox, M.D.            DATE OF BIRTH:  [birth year]   DATE OF PROCEDURE:  04/29/2004  DATE OF DISCHARGE:                                 OPERATIVE REPORT   PREOPERATIVE DIAGNOSES:  Persistent menorrhagia and endometrial polyp on  hydrosonogram.   POSTOPERATIVE DIAGNOSIS:  Large endometrial cavity and sessile endometrial  polyp.   ANESTHESIA:  General endotracheal.   SURGEON:  Pershing Cox, M.D.   INDICATIONS FOR PROCEDURE:  Jennifer [name] is a 35 year old female, who has  been followed in my office for some time with complaints of menorrhagia.  In  evaluation of this problem, we performed a sonogram in April 2005.  This  showed multiple small uterine myomas and a thickened endometrium.  For this  reason, she was brought back for hydrosonogram and on this procedure, she  was found to have an endometrial polyp.  It was felt that there were  probably two endometrial polyps, both approximately 1.5 cm in size laying on  the anterior and posterior uterine wall.  For this reason, the patient is  brought to the operating room today for planned resection of endometrial  polyps.   OPERATIVE FINDINGS:  Exam under anesthesia shows no masses.  The uterus is  about 8 weeks in size.  Irregularities could not be discerned on this exam.  The endometrial cavity sounded to 9.5 cm.  The cavity was easily distended  but hard to maintain distension.  Both ostia were seen.  There was a single  sessile polyp which was easily removed from the endometrial cavity.  There  were moderate curettings.   DESCRIPTION OF PROCEDURE:  Jennifer [name] was brought to the operating room  with an IV in place.  She had received 1 g of Ancef in the holding area.  Supine on the OR  table, general endotracheal anesthesia was administered by  intubation.  She was then placed into Allen stirrups, and the perineum,  vagina, lower abdomen, and thighs were prepped with a solution of Hibiclens.  Red rubber catheter was used to sterilely empty the bladder.  The patient  was draped for a sterile vaginal procedure with a collecting drape beneath  her hips in order to measure the effluent from her procedure.  Bivalve  speculum was inserted into the vagina, cervix was visualized, and 0.25%  Marcaine was injected into the cervix for a paracervical block.  The cervix  was grasped with a single-tooth tenaculum.  By injection sites for the  paracervical were 3, 4, 7, and 8 using total 18 mL of 0.25% Marcaine.  Kevorkian curette was used to obtain endocervical curettings.  The uterine  sound passed to 9.5 cm.  Serial Pratt dilators were used to dilate the  cervix to size 31 Pratt, and the resectoscope was introduced.   Using through-and-through sorbitol irrigation, the cavity was distended and  visualized.  The sessile polyp was noted and retrieved with the  resectoscope.  The cavity was then carefully photographed so that both ostia  and all the walls could be seen.  There were no other polyps and evidence of  submucosal myoma.  The resectoscope was removed, and a large, sharp curette  was used to curette the walls, collecting the fragment onto a Telfa.  These  were submitted  with the polyp as endometrial curettings and endometrial polyp.  There was  minimal bleeding at the end of the procedure.  It should be noted that in  using the sharp curette, I did not feel any irregularities to suggest a  submucosal fibroid.                                               Pershing Cox, M.D.    MAJ/MEDQ  D:  04/29/2004  T:  04/29/2004  Job:  [id number]

## 2012-01-12 ENCOUNTER — Emergency Department (HOSPITAL_COMMUNITY): Payer: Commercial Managed Care - PPO

## 2012-01-12 ENCOUNTER — Emergency Department (HOSPITAL_COMMUNITY)
Admission: EM | Admit: 2012-01-12 | Discharge: 2012-01-12 | Disposition: A | Discharge status: Home / Self Care | Payer: Commercial Managed Care - PPO | Attending: Emergency Medicine | Admitting: Emergency Medicine

## 2012-01-12 ENCOUNTER — Telehealth: Payer: Self-pay | Admitting: *Deleted

## 2012-01-12 ENCOUNTER — Encounter (HOSPITAL_COMMUNITY): Payer: Self-pay | Admitting: *Deleted

## 2012-01-12 DIAGNOSIS — R0789 Other chest pain: Secondary | ICD-10-CM

## 2012-01-12 DIAGNOSIS — I10 Essential (primary) hypertension: Secondary | ICD-10-CM | POA: Insufficient documentation

## 2012-01-12 DIAGNOSIS — R071 Chest pain on breathing: Secondary | ICD-10-CM | POA: Insufficient documentation

## 2012-01-12 HISTORY — DX: Essential (primary) hypertension: I10

## 2012-01-12 LAB — D-DIMER, QUANTITATIVE: D-Dimer, Quant: 0.46 ug/mL-FEU (ref 0.00–0.48)

## 2012-01-12 LAB — POCT I-STAT, CHEM 8
Calcium, Ion: 1.2 mmol/L (ref 1.12–1.32)
Glucose, Bld: 92 mg/dL (ref 70–99)
Hemoglobin: 14.3 g/dL (ref 12.0–15.0)
Potassium: 3.8 mEq/L (ref 3.5–5.1)
TCO2: 23 mmol/L (ref 0–100)

## 2012-01-12 LAB — POCT I-STAT TROPONIN I

## 2012-01-12 MED ORDER — IBUPROFEN 800 MG PO TABS
800.0000 mg | ORAL_TABLET | Freq: Once | ORAL | Status: AC
  Administered 2012-01-12: 800 mg via ORAL
  Filled 2012-01-12: qty 1

## 2012-01-12 MED ORDER — ONDANSETRON 4 MG PO TBDP
8.0000 mg | ORAL_TABLET | Freq: Once | ORAL | Status: AC
  Administered 2012-01-12: 8 mg via ORAL
  Filled 2012-01-12: qty 2

## 2012-01-12 MED ORDER — HYDROCODONE-ACETAMINOPHEN 5-325 MG PO TABS: 1.0000 | ORAL_TABLET | ORAL | Status: AC | PRN

## 2012-01-12 NOTE — Telephone Encounter (Signed)
Pt called stating that she is having unrelenting chest pain, and is in the car driving.  Will go straight to [facility] ER for evaluation and treatment.  If she has to stop, will call 911.

## 2012-01-12 NOTE — ED Provider Notes (Signed)
Complains of right-sided parasternal pleuritic in quality, nonradiating chest pain onset 3 days ago initially intermittent, constant since 4 AM today worse with raising her arms above her head patient had shortness of breath and nausea yesterday which have resolved no other associated symptoms cardiac risk factors none. On exam alert nontoxic lungs clear to auscultation heart regular rate rhythm with right parasternal area is exquisitely tender pain is reproduced by forcible flexion of right shoulder Assessment doubt acute coronary syndrome i.e. highly atypical symptoms. Low pretest clinical probability for pulmonary of wisdom Physical exam is most consistent with musculoskeletal chest pain  Doug Sou, MD 01/12/12 1030

## 2012-01-12 NOTE — ED Provider Notes (Signed)
History     CSN: [id number]  Arrival date & time 01/12/12  4098   First MD Initiated Contact with Patient 01/12/12 0915      Chief Complaint  Patient presents with  . Chest Pain    (Consider location/radiation/quality/duration/timing/severity/associated sxs/prior treatment) HPI  Past Medical History  Diagnosis Date  . Hypertension     History reviewed. No pertinent past surgical history.  History reviewed. No pertinent family history.  History  Substance Use Topics  . Smoking status: Never Smoker   . Smokeless tobacco: Not on file  . Alcohol Use: Yes     occ    OB History    Grav Para Term Preterm Abortions TAB SAB Ect Mult Living                  Review of Systems  Allergies  Review of patient's allergies indicates no known allergies.  Home Medications  No current outpatient prescriptions on file.  BP 172/127  Pulse 74  Temp(Src) 98.4 F (36.9 C) (Oral)  Resp 16  SpO2 100%  Physical Exam  ED Course  Procedures (including critical care time)  Labs Reviewed - No data to display No results found.   No diagnosis found.    MDM  Duplicate note        Doug Sou, MD 01/12/12 [phone]

## 2012-01-12 NOTE — ED Provider Notes (Signed)
Medical screening examination/treatment/procedure(s) were conducted as a shared visit with non-physician practitioner(s) and myself.  I personally evaluated the patient during the encounter  Doug Sou, MD 01/12/12 [phone]

## 2012-01-12 NOTE — ED Provider Notes (Signed)
History     CSN: [id number]  Arrival date & time 01/12/12  4696   First MD Initiated Contact with Patient 01/12/12 0915      Chief Complaint  Patient presents with  . Chest Pain    (Consider location/radiation/quality/duration/timing/severity/associated sxs/prior treatment) HPI Comments: Jennifer [name] presents with a two-day history of right-sided chest pain which she describes as sharp and nonradiating.  It has been constant, but worse at night and also worse with palpation.  She did notice shortness of breath yesterday when she was walking to her car from her job site, but has had no further shortness of breath.  She denies weakness or numbness in her upper extremities, denies fevers or chills, no cough or congestion.  Patient is a 36 y.o. female presenting with chest pain. The history is provided by the patient.  Chest Pain Pertinent negatives for primary symptoms include no fever, no shortness of breath, no abdominal pain, no nausea and no dizziness.  Pertinent negatives for associated symptoms include no numbness and no weakness.     Past Medical History  Diagnosis Date  . Hypertension     History reviewed. No pertinent past surgical history.  History reviewed. No pertinent family history.  History  Substance Use Topics  . Smoking status: Never Smoker   . Smokeless tobacco: Not on file  . Alcohol Use: Yes     occ    OB History    Grav Para Term Preterm Abortions TAB SAB Ect Mult Living                  Review of Systems  Constitutional: Negative for fever.  HENT: Negative for congestion, sore throat and neck pain.   Eyes: Negative.   Respiratory: Negative for chest tightness and shortness of breath.   Cardiovascular: Positive for chest pain.  Gastrointestinal: Negative for nausea and abdominal pain.  Genitourinary: Negative.   Musculoskeletal: Negative for joint swelling and arthralgias.  Skin: Negative.  Negative for rash and wound.  Neurological:  Negative for dizziness, weakness, light-headedness, numbness and headaches.  Hematological: Negative.   Psychiatric/Behavioral: Negative.     Allergies  Review of patient's allergies indicates no known allergies.  Home Medications   Current Outpatient Rx  Name Route Sig Dispense Refill  . HYDROCODONE-ACETAMINOPHEN 5-325 MG PO TABS Oral Take 1 tablet by mouth every 4 (four) hours as needed for pain. 15 tablet 0    BP 148/99  Pulse 70  Temp(Src) 98.4 F (36.9 C) (Oral)  Resp 18  SpO2 99%  LMP 12/24/2011  Physical Exam  Nursing note and vitals reviewed. Constitutional: She appears well-developed and well-nourished.  HENT:  Head: Normocephalic and atraumatic.  Eyes: Conjunctivae are normal.  Neck: Normal range of motion.  Cardiovascular: Normal rate, regular rhythm, normal heart sounds and intact distal pulses.   Pulmonary/Chest: Effort normal and breath sounds normal. No respiratory distress. She has no wheezes. She has no rales. She exhibits tenderness. Right breast exhibits tenderness. Right breast exhibits no mass and no skin change.         Tender to palpation along the right anterior chest wall and upper breast.  No lesions, nodules, rash.    Abdominal: Soft. Bowel sounds are normal. There is no tenderness.  Musculoskeletal: Normal range of motion.  Neurological: She is alert.  Skin: Skin is warm and dry.  Psychiatric: She has a normal mood and affect.    ED Course  Procedures (including critical care time)   Labs  Reviewed  D-DIMER, QUANTITATIVE  POCT I-STAT, CHEM 8  POCT I-STAT TROPONIN I   Dg Chest 2 View  01/12/2012  *RADIOLOGY REPORT*  Clinical Data: Chest pain.  CHEST - 2 VIEW  Comparison: Multiple priors, most recently 02/12/2010.  Findings: Lung volumes are normal.  No consolidative airspace disease.  No pleural effusions.  No pneumothorax.  No pulmonary nodule or mass noted.  Pulmonary vasculature and the cardiomediastinal silhouette are within normal  limits.  IMPRESSION: 1. No radiographic evidence of acute cardiopulmonary disease.  Original Report Authenticated By: Florencia Reasons, M.D.     1. Chest wall pain       MDM  Palpable right upper chest wall pain.  Labs reviewed prior to discharge, x-ray and EKG also reviewed.  Prescribed hydrocodone, encouraged heat therapy for pain.  Follow up with PCP if not improving over the next several days.   Date: 01/12/2012  Rate: 73  Rhythm: normal sinus rhythm  QRS Axis: normal  Intervals: normal  ST/T Wave abnormalities: normal  Conduction Disutrbances:none  Narrative Interpretation:   Old EKG Reviewed: Right ventricular conduction delay seen on EKG dated 02/12/2010 not seen today.          Burgess Amor, PA 01/12/12 1136  Burgess Amor, PA 01/12/12 1137

## 2012-01-12 NOTE — Discharge Instructions (Signed)
Chest Wall Pain Chest wall pain is pain in or around the bones and muscles of your chest. It may take up to 6 weeks to get better. It may take longer if you must stay physically active in your work and activities.  CAUSES  Chest wall pain may happen on its own. However, it may be caused by:  A viral illness like the flu.   Injury.   Coughing.   Exercise.   Arthritis.   Fibromyalgia.   Shingles.  HOME CARE INSTRUCTIONS   Avoid overtiring physical activity. Try not to strain or perform activities that cause pain. This includes any activities using your chest or your abdominal and side muscles, especially if heavy weights are used.   Put ice on the sore area.   Put ice in a plastic bag.   Place a towel between your skin and the bag.   Leave the ice on for 15 to 20 minutes per hour while awake for the first 2 days.   Only take over-the-counter or prescription medicines for pain, discomfort, or fever as directed by your caregiver.  SEEK IMMEDIATE MEDICAL CARE IF:   Your pain increases, or you are very uncomfortable.   You have a fever.   Your chest pain becomes worse.   You have new, unexplained symptoms.   You have nausea or vomiting.   You feel sweaty or lightheaded.   You have a cough with phlegm (sputum), or you cough up blood.  MAKE SURE YOU:   Understand these instructions.   Will watch your condition.   Will get help right away if you are not doing well or get worse.  Document Released: 09/04/2005 Document Revised: 08/24/2011 Document Reviewed: 05/01/2011 [facility] Patient Information 2012 [place], [place].   Use the medication prescribed for pain, drowsy, do not drive within 4 hours of taking.  You may also benefit from a heating pad applied to the chest 15 minutes several times daily.  Follow up with your primary doctor if not improving over the next several days.

## 2012-01-12 NOTE — ED Notes (Signed)
Pt reports several day hx of right sided intermittent chest pain non radiating, states worse at night, reports being woke up at 4am with sharp right sided pain associated with nausea.

## 2012-01-12 NOTE — ED Notes (Signed)
Pt states she was taken off her BP medication

## 2012-06-17 ENCOUNTER — Other Ambulatory Visit: Payer: Self-pay | Admitting: Neurology

## 2012-06-17 DIAGNOSIS — Q8500 Neurofibromatosis, unspecified: Secondary | ICD-10-CM

## 2012-06-24 ENCOUNTER — Other Ambulatory Visit: Payer: Commercial Managed Care - PPO

## 2012-07-01 ENCOUNTER — Ambulatory Visit
Admission: RE | Admit: 2012-07-01 | Discharge: 2012-07-01 | Disposition: A | Discharge status: Home / Self Care | Payer: Commercial Managed Care - PPO | Source: Ambulatory Visit | Attending: Neurology | Admitting: Neurology

## 2012-07-01 DIAGNOSIS — Q8500 Neurofibromatosis, unspecified: Secondary | ICD-10-CM

## 2012-07-01 MED ORDER — GADOBENATE DIMEGLUMINE 529 MG/ML IV SOLN
20.0000 mL | Freq: Once | INTRAVENOUS | Status: AC | PRN
  Administered 2012-07-01: 20 mL via INTRAVENOUS

## 2012-07-24 ENCOUNTER — Encounter: Payer: Self-pay | Admitting: Internal Medicine

## 2012-07-24 ENCOUNTER — Encounter: Payer: Commercial Managed Care - PPO | Admitting: Internal Medicine

## 2012-07-24 NOTE — Progress Notes (Deleted)
No chief complaint on file.   HPI: Last visit was  NOVember 2011 ROS: See pertinent positives and negatives per HPI.  Past Medical History  Diagnosis Date  . Hypertension     No family history on file.  History   Social History  . Marital Status: Single    Spouse Name: N/A    Number of Children: N/A  . Years of Education: N/A   Social History Main Topics  . Smoking status: Never Smoker   . Smokeless tobacco: Not on file  . Alcohol Use: Yes     Comment: occ  . Drug Use:   . Sexually Active:    Other Topics Concern  . Not on file   Social History Narrative  . No narrative on file    No current outpatient prescriptions on file.  EXAM:  There were no vitals filed for this visit.  There is no height or weight on file to calculate BMI.  GENERAL: vitals reviewed and listed above, alert, oriented, appears well hydrated and in no acute distress  HEENT: atraumatic, conjunttiva clear, no obvious abnormalities on inspection of external nose and ears OP : no lesin edema or exudate   NECK: no obvious masses on inspection palpation   LUNGS: clear to auscultation bilaterally, no wheezes, rales or rhonchi, good air movement  CV: HRRR, no clubbing cyanosis or  peripheral edema nl cap refill   MS: moves all extremities without noticeable focal  abnormality  PSYCH: pleasant and cooperative, no obvious depression or anxiety  ASSESSMENT AND PLAN:  Discussed the following assessment and plan:  No diagnosis found.  -Patient advised to return or notify a doctor immediately if symptoms worsen or persist or new concerns arise.  There are no Patient Instructions on file for this visit.   Lorretta Harp

## 2012-08-21 ENCOUNTER — Ambulatory Visit (INDEPENDENT_AMBULATORY_CARE_PROVIDER_SITE_OTHER): Payer: Commercial Managed Care - PPO | Admitting: Family Medicine

## 2012-08-21 ENCOUNTER — Telehealth: Payer: Self-pay

## 2012-08-21 ENCOUNTER — Encounter: Payer: Self-pay | Admitting: Family Medicine

## 2012-08-21 VITALS — BP 138/90 | HR 96 | Temp 97.9°F | Wt 294.0 lb

## 2012-08-21 DIAGNOSIS — Z23 Encounter for immunization: Secondary | ICD-10-CM

## 2012-08-21 DIAGNOSIS — J069 Acute upper respiratory infection, unspecified: Secondary | ICD-10-CM

## 2012-08-21 MED ORDER — GUAIFENESIN-CODEINE 100-10 MG/5ML PO SYRP: 5.0000 mL | ORAL_SOLUTION | Freq: Three times a day (TID) | ORAL | Status: DC | PRN

## 2012-08-21 MED ORDER — FLUTICASONE PROPIONATE 50 MCG/ACT NA SUSP: 2.0000 | Freq: Every day | NASAL | Status: DC

## 2012-08-21 NOTE — Patient Instructions (Addendum)

## 2012-08-21 NOTE — Telephone Encounter (Signed)
Pt will see Dr. Selena Batten on 08/28/12

## 2012-08-21 NOTE — Telephone Encounter (Signed)
Pt was seen in the office this morning and states she needs a refill on her Diovan.  Pls advise.

## 2012-08-21 NOTE — Telephone Encounter (Signed)
Misty called and spoke with pt and advised that pt needed a new patient appt to be seen to have bp meds filled. Pt is aware.

## 2012-08-21 NOTE — Progress Notes (Signed)
Chief Complaint  Patient presents with  . Cough    chills, sore throat, body aches, hoarseness, fever of 101.1 yesterday     HPI: -started: 2 days ago -symptoms:nasal congestion, sore throat, cough, temp 100.1, body aches, lots of mucus in throat - felt like pill got stuck in throat last night -denies:fever, SOB, NVD, tooth pain, strep, flu or mono exposure -has tried: colleague at work sick -sick contacts: tried tylenol flu and cold -Hx of: denies hx of asthma or allergy -she is worried about having the flu, did not have flu vaccine   ROS: See pertinent positives and negatives per HPI.  Past Medical History  Diagnosis Date  . Hypertension   . Breast abscess of female     Recurrent  . Headache   . Fibroids     w/bleeding    No family history on file.  History   Social History  . Marital Status: Single    Spouse Name: N/A    Number of Children: N/A  . Years of Education: N/A   Social History Main Topics  . Smoking status: Never Smoker   . Smokeless tobacco: None  . Alcohol Use: Yes     Comment: occ  . Drug Use:   . Sexually Active:    Other Topics Concern  . None   Social History Narrative  . None    Current outpatient prescriptions:doxepin (SINEQUAN) 10 MG capsule, Take 10 mg by mouth., Disp: , Rfl: ;  naproxen sodium (ANAPROX) 550 MG tablet, Take 550 mg by mouth 2 (two) times daily with a meal., Disp: , Rfl: ;  triamterene-hydrochlorothiazide (MAXZIDE-25) 37.5-25 MG per tablet, Take 1 tablet by mouth daily., Disp: , Rfl: ;  valsartan (DIOVAN) 160 MG tablet, Take 160 mg by mouth daily., Disp: , Rfl:  fluticasone (FLONASE) 50 MCG/ACT nasal spray, Place 2 sprays into the nose daily., Disp: 16 g, Rfl: 6;  guaiFENesin-codeine (CHERATUSSIN AC) 100-10 MG/5ML syrup, Take 5 mLs by mouth 3 (three) times daily as needed for cough., Disp: 120 mL, Rfl: 0  EXAM:  Filed Vitals:   08/21/12 0802  BP: 138/90  Pulse: 96  Temp: 97.9 F (36.6 C)    There is no height on  file to calculate BMI.  GENERAL: vitals reviewed and listed above, alert, oriented, appears well hydrated and in no acute distress  HEENT: atraumatic, conjunttiva clear, no obvious abnormalities on inspection of external nose and ears, normal appearance of ear canals and TMs, clear nasal congestion, mild post oropharyngeal erythema with PND, no 2+ tonsillar edema, no exudate, no sinus TTP  NECK: no obvious masses on inspection  LUNGS: clear to auscultation bilaterally, no wheezes, rales or rhonchi, good air movement  CV: HRRR, no peripheral edema  MS: moves all extremities without noticeable abnormality  PSYCH: pleasant and cooperative, no obvious depression or anxiety  ASSESSMENT AND PLAN:  Discussed the following assessment and plan:  1. Viral upper respiratory illness  guaiFENesin-codeine (CHERATUSSIN AC) 100-10 MG/5ML syrup, fluticasone (FLONASE) 50 MCG/ACT nasal spray   -afebrile, well appearing on exam, rapid flu neg -flu vaccine given -Patient advised to return or notify a doctor immediately if symptoms worsen or persist or new concerns arise.  Patient Instructions  INSTRUCTIONS FOR UPPER RESPIRATORY INFECTION:  -plenty of rest and fluids  -nasal saline wash 2-3 times daily (use prepackaged nasal saline or bottled/distilled water if making your own)   -clean nose with nasal saline before using the nasal steroid or sinex  -can use  sinex nasal spray for drainage and nasal congestion - but do NOT use longer then 3-4 days  -can use tylenol or ibuprofen as directed for aches and sorethroat  -in the winter time, using a humidifier at night is helpful (please follow cleaning instructions)  -if you are taking a cough medication - use only as directed, may also try a teaspoon of honey to coat the throat and throat lozenges  -for sore throat, salt water gargles can help  -follow up if you have fevers, facial pain, tooth pain, difficulty breathing or are worsening or not  getting better in 5-7 days      KIM, HANNAH R.

## 2012-08-21 NOTE — Telephone Encounter (Signed)
error 

## 2012-08-22 ENCOUNTER — Ambulatory Visit (INDEPENDENT_AMBULATORY_CARE_PROVIDER_SITE_OTHER): Payer: Commercial Managed Care - PPO | Admitting: Internal Medicine

## 2012-08-22 ENCOUNTER — Encounter: Payer: Self-pay | Admitting: Internal Medicine

## 2012-08-22 ENCOUNTER — Telehealth: Payer: Self-pay | Admitting: Internal Medicine

## 2012-08-22 VITALS — BP 160/90 | HR 95 | Temp 97.4°F | Wt 295.0 lb

## 2012-08-22 DIAGNOSIS — H6691 Otitis media, unspecified, right ear: Secondary | ICD-10-CM

## 2012-08-22 DIAGNOSIS — I10 Essential (primary) hypertension: Secondary | ICD-10-CM

## 2012-08-22 DIAGNOSIS — H6690 Otitis media, unspecified, unspecified ear: Secondary | ICD-10-CM

## 2012-08-22 DIAGNOSIS — M069 Rheumatoid arthritis, unspecified: Secondary | ICD-10-CM

## 2012-08-22 MED ORDER — AMOXICILLIN 500 MG PO CAPS: 500.0000 mg | ORAL_CAPSULE | Freq: Three times a day (TID) | ORAL | Status: DC

## 2012-08-22 MED ORDER — LISINOPRIL-HYDROCHLOROTHIAZIDE 20-12.5 MG PO TABS: 1.0000 | ORAL_TABLET | Freq: Every day | ORAL | Status: DC

## 2012-08-22 NOTE — Telephone Encounter (Signed)
[facility] Triage Call Report Triage Record Num: [id number] Operator: Candida Peeling Patient Name: Jennifer [name] Call Date & Time: 08/21/2012 10:33:49PM Patient Phone: [phone] PCP: Kriste Basque Patient Gender: Female PCP Fax : Patient DOB: Nov 07, 1975 [facility] Reason for Call: Caller: [name]/Patient; PCP: Kriste Basque (Family Practice); CB#: [phone]; Call regarding Pt was seen this morning but feels worse now. Chills, ear ache...; Onset 08/21/12 seen in office today, told she had mucous buildup, given cough med and Flonase, tested for flu and given flu vaccine. Onset 08/21/12 at approx 1800 began having chills, right ear began feeling full and now is throbbing. Temp has not measured but oral temp now at 2240 is 98.3, having chills. Has not taken any meds today. LMP just started 08/21/12. Emergent sxs r/o per [facility]:[id number] protocol w/ the exception of "Constant or intermittent dull earache, throbbing in ear(s) or feeling of fullness; may interfere with sleep or ability to carry out normal activities". Care advice given. Appt scheduled on 08/22/12 at 1000 with Dr Fabian Sharp. Protocol(s) Used: Ear: Symptoms Recommended Outcome per Protocol: See Provider within 24 hours Reason for Outcome: Constant or intermittent dull earache, throbbing in ear(s) or feeling of fullness; may interfere with sleep or ability to carry out normal activities Care Advice: A warm washcloth or heating pad set on low to the affected ear may help relieve the discomfort. May apply for 15 to 20 minutes, 3 to 4 times a day. ~ ~ Do not use eardrops unless directed by a healthcare provider, especially if having ear drainage. Resting or sleeping with head elevated, such as semi-reclining in a recliner, may help reduce inner ear pressure and discomfort. ~ Keep ear canal as dry as possible. Take a bath instead of showering. Try to keep water out of ear when washing hair by placing cotton balls in ear  opening. Avoid swimming or water sports until okayed by provider. ~ Analgesic/Antipyretic Advice - NSAIDs: Consider aspirin, ibuprofen, naproxen or ketoprofen for pain or fever as directed on label or by pharmacist/provider. PRECAUTIONS: - If over 61 years of age, should not take longer than 1 week without consulting provider. EXCEPTIONS: - Should not be used if taking blood thinners or have bleeding problems. - Do not use if have history of sensitivity/allergy to any of these medications; or history of cardiovascular, ulcer, kidney, liver disease or diabetes unless approved by provider. - Do not exceed recommended dose or frequency. ~ Speak with provider as soon as possible if having: - discharge from ear that does not look like earwax or that has bad odor - increased redness or swelling of external ear - worsening pain - new or worsening dizziness - or unsteadiness. ~ 08/21/2012 10:57:16PM Page 1 of 1 [id number]

## 2012-08-22 NOTE — Progress Notes (Signed)
Chief Complaint  Patient presents with  . Generalized Body Aches  . Headache  . Sore Throat    Pt was seen my Dr. Selena Batten on 08/21/12.  Sx have worsened over night.  She can no longer hear out of her rt ear.  . Cough  . Nasal Congestion    HPI: Patient comes in today for SDA for  new problem evaluation. Just seem by dr Selena Batten yesterday with flu lik illness viral . At about 6 pm last night had Onset of right ear pain pain and dec hearing ; now worse with coughing or sneezing.    New .  Without hx of same   No fever today achiness . can't hear well and has sore throat.  Pill cuaght in throat  naproxyn still a bit sore   No hemoptysis . No ger sx now   bp elevated over the last visits Dr Arelia Sneddon and others. She has been given dx of RA and to see dr D in a few weeks.  Has been on divan and tmp hctz in the past  Denies memory of cough with acei . Uncertain why on that regimen and went off   Last visit with me was a couple of years ago. Followed by her GYNE ROS: See pertinent positives and negatives per HPI. No recent antibiotics  Past Medical History  Diagnosis Date  . Hypertension   . Breast abscess of female     Recurrent  . Headache   . Fibroids     w/bleeding    No family history on file.  History   Social History  . Marital Status: Single    Spouse Name: N/A    Number of Children: N/A  . Years of Education: N/A   Social History Main Topics  . Smoking status: Never Smoker   . Smokeless tobacco: None  . Alcohol Use: Yes     Comment: occ  . Drug Use:   . Sexually Active:    Other Topics Concern  . None   Social History Narrative  . None    Outpatient Encounter Prescriptions as of 08/22/2012  Medication Sig Dispense Refill  . doxepin (SINEQUAN) 10 MG capsule Take 10 mg by mouth.      . fluticasone (FLONASE) 50 MCG/ACT nasal spray Place 2 sprays into the nose daily.  16 g  6  . guaiFENesin-codeine (CHERATUSSIN AC) 100-10 MG/5ML syrup Take 5 mLs by mouth 3 (three) times  daily as needed for cough.  120 mL  0  . naproxen sodium (ANAPROX) 550 MG tablet Take 550 mg by mouth 2 (two) times daily with a meal.      . valsartan (DIOVAN) 160 MG tablet Take 160 mg by mouth daily.      . [DISCONTINUED] triamterene-hydrochlorothiazide (MAXZIDE-25) 37.5-25 MG per tablet Take 1 tablet by mouth daily.      [name] amoxicillin (AMOXIL) 500 MG capsule Take 1 capsule (500 mg total) by mouth 3 (three) times daily.  21 capsule  0  . lisinopril-hydrochlorothiazide (PRINZIDE) 20-12.5 MG per tablet Take 1 tablet by mouth daily.  90 tablet  1    EXAM:  BP 160/90  Pulse 95  Temp 97.4 F (36.3 C) (Oral)  Wt 295 lb (133.811 kg)  SpO2 95%  There is no height on file to calculate BMI.  GENERAL: vitals reviewed and listed above, alert, oriented, appears well hydrated  Non toxic congested with mild cough feels badly   HEENT: atraumatic, conjunctiva  clear,  no obvious abnormalities on inspection of external nose   Very congested no face pain     Right eac nl tm red bulging abnormal LM  Op tonsil 1 + red no exudate  No lesion no edema  NECK: no obvious masses on inspection palpation no sig adenopathy   LUNGS: clear to auscultation bilaterally, no wheezes, rales or rhonchi, good air movement  CV: HRRR, no clubbing cyanosis or  peripheral edema nl cap refill   MS: moves all extremities   PSYCH: pleasant and cooperative, no obvious depression or anxiety feels uncomfortable   ASSESSMENT AND PLAN:  Discussed the following assessment and plan:  1. Acute right otitis media    optins discussed  expectant managment no antibiotic recently   2. HYPERTENSION    restart ace hctz ehn possiel no hx of cough with ace that pt can remember and no risk preg  3. Rheumatoid arthritis    per dr Durenda Age  on naproxyn    -Patient advised to return or notify health care team  immediately if symptoms worsen or persist or new concerns arise.  Patient Instructions  Take antibiotic for ear  infection.  Can begin blood pressure med .   And keep appt next week.    Avoid decongestant for now cause of BP elevation.   Otitis Media, Adult A middle ear infection is an infection in the space behind the eardrum. The medical name for this is "otitis media." It may happen after a common cold. It is caused by a germ that starts growing in that space. You may feel swollen glands in your neck on the side of the ear infection. HOME CARE INSTRUCTIONS   Take your medicine as directed until it is gone, even if you feel better after the first few days.  Only take over-the-counter or prescription medicines for pain, discomfort, or fever as directed by your caregiver.  Occasional use of a nasal decongestant a couple times per day may help with discomfort and help the eustachian tube to drain better. Follow up with your caregiver in 10 to 14 days or as directed, to be certain that the infection has cleared. Not keeping the appointment could result in a chronic or permanent injury, pain, hearing loss and disability. If there is any problem keeping the appointment, you must call back to this facility for assistance. SEEK IMMEDIATE MEDICAL CARE IF:   You are not getting better in 2 to 3 days.  You have pain that is not controlled with medication.  You feel worse instead of better.  You cannot use the medication as directed.  You develop swelling, redness or pain around the ear or stiffness in your neck. MAKE SURE YOU:   Understand these instructions.  Will watch your condition.  Will get help right away if you are not doing well or get worse. Document Released: 06/09/2004 Document Revised: 11/27/2011 Document Reviewed: 04/10/2008 [facility] Patient Information 2013 [place], [place].      Neta Mends. Panosh M.D.   ROS: See pertinent positives and negatives per HPI.  Past Medical History  Diagnosis Date  . Hypertension   . Breast abscess of female     Recurrent  . Headache   .  Fibroids     w/bleeding    No family history on file.  History   Social History  . Marital Status: Single    Spouse Name: N/A    Number of Children: N/A  . Years of Education: N/A   Social History  Main Topics  . Smoking status: Never Smoker   . Smokeless tobacco: None  . Alcohol Use: Yes     Comment: occ  . Drug Use:   . Sexually Active:    Other Topics Concern  . None   Social History Narrative  . None    Outpatient Encounter Prescriptions as of 08/22/2012  Medication Sig Dispense Refill  . doxepin (SINEQUAN) 10 MG capsule Take 10 mg by mouth.      . fluticasone (FLONASE) 50 MCG/ACT nasal spray Place 2 sprays into the nose daily.  16 g  6  . guaiFENesin-codeine (CHERATUSSIN AC) 100-10 MG/5ML syrup Take 5 mLs by mouth 3 (three) times daily as needed for cough.  120 mL  0  . naproxen sodium (ANAPROX) 550 MG tablet Take 550 mg by mouth 2 (two) times daily with a meal.      . valsartan (DIOVAN) 160 MG tablet Take 160 mg by mouth daily.      . [DISCONTINUED] triamterene-hydrochlorothiazide (MAXZIDE-25) 37.5-25 MG per tablet Take 1 tablet by mouth daily.      [name] amoxicillin (AMOXIL) 500 MG capsule Take 1 capsule (500 mg total) by mouth 3 (three) times daily.  21 capsule  0  . lisinopril-hydrochlorothiazide (PRINZIDE) 20-12.5 MG per tablet Take 1 tablet by mouth daily.  90 tablet  1    EXAM:  BP 160/90  Pulse 95  Temp 97.4 F (36.3 C) (Oral)  Wt 295 lb (133.811 kg)  SpO2 95%  There is no height on file to calculate BMI.  GENERAL: vitals reviewed and listed above, alert, oriented, appears well hydrated and in no acute distress  HEENT: atraumatic, conjunctiva  clear, no obvious abnormalities on inspection of external nose and ears OP : no lesion edema or exudate   NECK: no obvious masses on inspection palpation   LUNGS: clear to auscultation bilaterally, no wheezes, rales or rhonchi, good air movement  CV: HRRR, no clubbing cyanosis or  peripheral edema nl cap refill    MS: moves all extremities without noticeable focal  abnormality  PSYCH: pleasant and cooperative, no obvious depression or anxiety  ASSESSMENT AND PLAN:  Discussed the following assessment and plan:  1. Acute right otitis media    optins discussed  expectant managment no antibiotic recently   2. HYPERTENSION    restart ace hctz ehn possiel no hx of cough with ace that pt can remember and no risk preg  3. Rheumatoid arthritis    per dr Durenda Age  on naproxyn    -Patient advised to return or notify health care team  immediately if symptoms worsen or persist or new concerns arise.  Patient Instructions  Take antibiotic for ear infection.  Can begin blood pressure med .   And keep appt next week.    Avoid decongestant for now cause of BP elevation.   Otitis Media, Adult A middle ear infection is an infection in the space behind the eardrum. The medical name for this is "otitis media." It may happen after a common cold. It is caused by a germ that starts growing in that space. You may feel swollen glands in your neck on the side of the ear infection. HOME CARE INSTRUCTIONS   Take your medicine as directed until it is gone, even if you feel better after the first few days.  Only take over-the-counter or prescription medicines for pain, discomfort, or fever as directed by your caregiver.  Occasional use of a nasal decongestant a couple times  per day may help with discomfort and help the eustachian tube to drain better. Follow up with your caregiver in 10 to 14 days or as directed, to be certain that the infection has cleared. Not keeping the appointment could result in a chronic or permanent injury, pain, hearing loss and disability. If there is any problem keeping the appointment, you must call back to this facility for assistance. SEEK IMMEDIATE MEDICAL CARE IF:   You are not getting better in 2 to 3 days.  You have pain that is not controlled with medication.  You feel  worse instead of better.  You cannot use the medication as directed.  You develop swelling, redness or pain around the ear or stiffness in your neck. MAKE SURE YOU:   Understand these instructions.  Will watch your condition.  Will get help right away if you are not doing well or get worse. Document Released: 06/09/2004 Document Revised: 11/27/2011 Document Reviewed: 04/10/2008 [facility] Patient Information 2013 [place], [place].      Neta Mends. Panosh M.D.

## 2012-08-22 NOTE — Patient Instructions (Addendum)
Take antibiotic for ear infection.  Can begin blood pressure med .   And keep appt next week.    Avoid decongestant for now cause of BP elevation.   Otitis Media, Adult A middle ear infection is an infection in the space behind the eardrum. The medical name for this is "otitis media." It may happen after a common cold. It is caused by a germ that starts growing in that space. You may feel swollen glands in your neck on the side of the ear infection. HOME CARE INSTRUCTIONS   Take your medicine as directed until it is gone, even if you feel better after the first few days.  Only take over-the-counter or prescription medicines for pain, discomfort, or fever as directed by your caregiver.  Occasional use of a nasal decongestant a couple times per day may help with discomfort and help the eustachian tube to drain better. Follow up with your caregiver in 10 to 14 days or as directed, to be certain that the infection has cleared. Not keeping the appointment could result in a chronic or permanent injury, pain, hearing loss and disability. If there is any problem keeping the appointment, you must call back to this facility for assistance. SEEK IMMEDIATE MEDICAL CARE IF:   You are not getting better in 2 to 3 days.  You have pain that is not controlled with medication.  You feel worse instead of better.  You cannot use the medication as directed.  You develop swelling, redness or pain around the ear or stiffness in your neck. MAKE SURE YOU:   Understand these instructions.  Will watch your condition.  Will get help right away if you are not doing well or get worse. Document Released: 06/09/2004 Document Revised: 11/27/2011 Document Reviewed: 04/10/2008 [facility] Patient Information 2013 [place], [place].

## 2012-08-25 NOTE — Progress Notes (Signed)
Note opened and patient canceled appt  Reviewed no visit

## 2012-08-26 ENCOUNTER — Ambulatory Visit: Payer: Commercial Managed Care - PPO | Admitting: Family Medicine

## 2012-08-28 ENCOUNTER — Ambulatory Visit (INDEPENDENT_AMBULATORY_CARE_PROVIDER_SITE_OTHER): Payer: Commercial Managed Care - PPO | Admitting: Internal Medicine

## 2012-08-28 ENCOUNTER — Encounter: Payer: Self-pay | Admitting: Internal Medicine

## 2012-08-28 ENCOUNTER — Ambulatory Visit: Payer: Commercial Managed Care - PPO | Admitting: Family Medicine

## 2012-08-28 VITALS — BP 160/108 | HR 100 | Temp 98.4°F | Wt 289.0 lb

## 2012-08-28 DIAGNOSIS — K208 Other esophagitis without bleeding: Secondary | ICD-10-CM

## 2012-08-28 DIAGNOSIS — I10 Essential (primary) hypertension: Secondary | ICD-10-CM

## 2012-08-28 DIAGNOSIS — H6690 Otitis media, unspecified, unspecified ear: Secondary | ICD-10-CM

## 2012-08-28 DIAGNOSIS — T50905A Adverse effect of unspecified drugs, medicaments and biological substances, initial encounter: Secondary | ICD-10-CM

## 2012-08-28 DIAGNOSIS — Z23 Encounter for immunization: Secondary | ICD-10-CM

## 2012-08-28 DIAGNOSIS — M069 Rheumatoid arthritis, unspecified: Secondary | ICD-10-CM | POA: Insufficient documentation

## 2012-08-28 HISTORY — DX: Adverse effect of unspecified drugs, medicaments and biological substances, initial encounter: T50.905A

## 2012-08-28 HISTORY — DX: Otitis media, unspecified, unspecified ear: H66.90

## 2012-08-28 MED ORDER — AMOXICILLIN 400 MG/5ML PO SUSR: 400.0000 mg | Freq: Three times a day (TID) | ORAL | Status: DC

## 2012-08-28 MED ORDER — SUCRALFATE 1 GM/10ML PO SUSP: 1.0000 g | Freq: Four times a day (QID) | ORAL | Status: DC

## 2012-08-28 MED ORDER — LANSOPRAZOLE 30 MG PO TBDP: 30.0000 mg | ORAL_TABLET | Freq: Every day | ORAL | Status: DC

## 2012-08-28 NOTE — Progress Notes (Signed)
Chief Complaint  Patient presents with  . Follow-up    BP Meds.  Stated that she feels like her throat is closing.  Cannot eat because food is getting caught.  Her Orthopedic doctor would like for her to get a pneumococcal injection.    HPI: Patient comes in today for follow up of  multiple medical problems.  Has a new problem :  Ear infection : started getting better and then began aching some increase hearing.  Not taking for 2 day.     Pill  of amox got stuck esophagus for  2 days  And getting worse. Gargling.  Hard to swallow solids more than liquids No fever.  Only liquids  For the past few days.  ? What to do . Has bene taken crushed up aleve also  Felt like choking last night   HT: hasn't taken bp med yet  Has directed from Dr D to get pneumo vaccine and eye exam before going on plaquinil has concerns about se.   No NVD new rash  ROS: See pertinent positives and negatives per HPI.  Past Medical History  Diagnosis Date  . Hypertension   . Breast abscess of female     Recurrent  . Headache   . Fibroids     w/bleeding    No family history on file.  History   Social History  . Marital Status: Single    Spouse Name: N/A    Number of Children: N/A  . Years of Education: N/A   Social History Main Topics  . Smoking status: Never Smoker   . Smokeless tobacco: None  . Alcohol Use: Yes     Comment: occ  . Drug Use:   . Sexually Active:    Other Topics Concern  . None   Social History Narrative  . None    Outpatient Encounter Prescriptions as of 08/28/2012  Medication Sig Dispense Refill  . doxepin (SINEQUAN) 10 MG capsule Take 10 mg by mouth.      . fluticasone (FLONASE) 50 MCG/ACT nasal spray Place 2 sprays into the nose daily.  16 g  6  . guaiFENesin-codeine (CHERATUSSIN AC) 100-10 MG/5ML syrup Take 5 mLs by mouth 3 (three) times daily as needed for cough.  120 mL  0  . lisinopril-hydrochlorothiazide (PRINZIDE) 20-12.5 MG per tablet Take 1 tablet by mouth  daily.  90 tablet  1  . naproxen sodium (ANAPROX) 550 MG tablet Take 550 mg by mouth 2 (two) times daily with a meal.      . valsartan (DIOVAN) 160 MG tablet Take 160 mg by mouth daily.      . [DISCONTINUED] amoxicillin (AMOXIL) 500 MG capsule Take 1 capsule (500 mg total) by mouth 3 (three) times daily.  21 capsule  0  . amoxicillin (AMOXIL) 400 MG/5ML suspension Take 5 mLs (400 mg total) by mouth 3 (three) times daily.  100 mL  0  . lansoprazole (PREVACID SOLUTAB) 30 MG disintegrating tablet Take 1 tablet (30 mg total) by mouth daily.  30 tablet  0  . sucralfate (CARAFATE) 1 GM/10ML suspension Take 10 mLs (1 g total) by mouth 4 (four) times daily.  420 mL  0    EXAM:  BP 160/108  Pulse 100  Temp 98.4 F (36.9 C) (Oral)  Wt 289 lb (131.09 kg)  SpO2 95%  LMP 08/21/2012  There is no height on file to calculate BMI.  GENERAL: vitals reviewed and listed above, alert, oriented, appears well hydrated  and in no acute distress hoarse  Tired appearing  And hoarse   HEENT: atraumatic, conjunctiva  clear, no obvious abnormalities on inspection of external nose and ears   Right tm still yellow fluid  Slight erythema dec bony lm  OP t:onsil 1 pulse airway ok no edema  NECK: no obvious masses on inspection palpation  Points to suprasternal notch area as area of dysphagia   LUNGS: clear to auscultation bilaterally, no wheezes, rales or rhonchi, good air movement Abdomen:  Sof,t normal bowel sounds without hepatosplenomegaly, no guarding rebound or masses no CVA tenderness CV: HRRR, no clubbing cyanosis or  peripheral edema nl cap refill  ABD soft  MS: moves all extremities without noticeable focal  abnormality  PSYCH: pleasant and cooperative, no obvious depression mildly anxious  See note from dr D   ASSESSMENT AND PLAN:  Discussed the following assessment and plan:  1. Pill esophagitis     amoxicillin  by hx  disc plan nl voice except hoarse not drooling  close fu  if not getting better  with plan stop aleve  liquid ibu onlyf necessary  2. HYPERTENSION     off meds cause of not swallowing well   add new med   can put in apple sause   3. Acute otitis media     improved  change to liquid medication  4. Rheumatoid arthritis     prevnar 13 at this time and should plan pnv 23 in future   note from dr D. pre plaquenil  5. Need for pneumococcal vaccination  Pneumococcal conjugate vaccine 13-valent less than 5yo IM    -Patient advised to return or notify health care team  immediately if symptoms worsen or persist or new concerns arise.  Patient Instructions  Give prevnar 13 today and plan pneumovax  in the future .  Change amoxicillin to liquid for ow the ear is improving but not all better. You have pill induced throat symptoms   Continue liquids and add  meds discussed   Crush up blood pressure medication to take   affter  Beginning  above ,to make sure we get your bp in control .  If the throat  is not getting better in the next week need OV ( I will be out of office )    Otherwise rov in 2-3 weeks .     Neta Mends. Kimetha Trulson M.D.

## 2012-08-28 NOTE — Patient Instructions (Addendum)
Give prevnar 13 today and plan pneumovax  in the future .  Change amoxicillin to liquid for ow the ear is improving but not all better. You have pill induced throat symptoms   Continue liquids and add  meds discussed   Crush up blood pressure medication to take   affter  Beginning  above ,to make sure we get your bp in control .  If the throat  is not getting better in the next week need OV ( I will be out of office )    Otherwise rov in 2-3 weeks .

## 2012-09-13 ENCOUNTER — Ambulatory Visit (INDEPENDENT_AMBULATORY_CARE_PROVIDER_SITE_OTHER): Payer: Commercial Managed Care - PPO | Admitting: Internal Medicine

## 2012-09-13 ENCOUNTER — Encounter: Payer: Self-pay | Admitting: Internal Medicine

## 2012-09-13 VITALS — BP 150/100 | HR 87 | Temp 97.7°F | Wt 292.0 lb

## 2012-09-13 DIAGNOSIS — R1310 Dysphagia, unspecified: Secondary | ICD-10-CM

## 2012-09-13 DIAGNOSIS — T50905A Adverse effect of unspecified drugs, medicaments and biological substances, initial encounter: Secondary | ICD-10-CM

## 2012-09-13 DIAGNOSIS — J370 Chronic laryngitis: Secondary | ICD-10-CM

## 2012-09-13 DIAGNOSIS — K208 Other esophagitis without bleeding: Secondary | ICD-10-CM

## 2012-09-13 DIAGNOSIS — M069 Rheumatoid arthritis, unspecified: Secondary | ICD-10-CM

## 2012-09-13 DIAGNOSIS — I10 Essential (primary) hypertension: Secondary | ICD-10-CM

## 2012-09-13 NOTE — Patient Instructions (Addendum)
Continue  Prevacid and can take twice a day for a few days  Try to take the bp medication as we discussed.  Cant tell if it will work  Hopefully with a bit more time this should get better .   If  The solid swallowing is not improved in another week we will Get GI to see you.  ROV in a month .  taek monistat if needed if gets a yeast infection

## 2012-09-13 NOTE — Progress Notes (Signed)
Chief Complaint  Patient presents with  . Follow-up    Continues to have cough and a sore throat.    HPI: Patient comes in today for follow up of  multiple medical problems.  Since last visit she has visited Dr. Jenne Pane her ENT doctor he was unable to view her vocal cords because of throat sensitivity but switched her antibiotic to Levaquin liquid which she is just beginning to start. He also gave her omeprazole to take twice a day but she states the capsules are too large to swallow at this point and hasn't started it. She has been crushing up medicines and fearful of swallowing pills but taking Carafate and dissolvable Prevacid. Although improved she is still having  solid food dysphagia. Upper chest esophagus area. No vomiting no shortness of breath she still congested and having some coughing but no wheezing. Her ear bothers her some  No fever shortness of breath. ROS: See pertinent positives and negatives per HPI.  Past Medical History  Diagnosis Date  . Hypertension   . Breast abscess of female     Recurrent  . Headache   . Fibroids     w/bleeding    No family history on file.  History   Social History  . Marital Status: Single    Spouse Name: N/A    Number of Children: N/A  . Years of Education: N/A   Social History Main Topics  . Smoking status: Never Smoker   . Smokeless tobacco: None  . Alcohol Use: Yes     Comment: occ  . Drug Use:   . Sexually Active:    Other Topics Concern  . None   Social History Narrative  . None    Outpatient Encounter Prescriptions as of 09/13/2012  Medication Sig Dispense Refill  . doxepin (SINEQUAN) 10 MG capsule Take 10 mg by mouth.      . fluticasone (FLONASE) 50 MCG/ACT nasal spray Place 2 sprays into the nose daily.  16 g  6  . lansoprazole (PREVACID SOLUTAB) 30 MG disintegrating tablet Take 1 tablet (30 mg total) by mouth daily.  30 tablet  0  . lisinopril-hydrochlorothiazide (PRINZIDE) 20-12.5 MG per tablet Take 1 tablet by  mouth daily.  90 tablet  1  . sucralfate (CARAFATE) 1 GM/10ML suspension Take 10 mLs (1 g total) by mouth 4 (four) times daily.  420 mL  0  . levofloxacin (LEVAQUIN) 25 MG/ML solution Take 500 mg by mouth daily.      [name] omeprazole (PRILOSEC) 40 MG capsule Take 40 mg by mouth 2 (two) times daily.      . [DISCONTINUED] amoxicillin (AMOXIL) 400 MG/5ML suspension Take 5 mLs (400 mg total) by mouth 3 (three) times daily.  100 mL  0  . [DISCONTINUED] guaiFENesin-codeine (CHERATUSSIN AC) 100-10 MG/5ML syrup Take 5 mLs by mouth 3 (three) times daily as needed for cough.  120 mL  0  . [DISCONTINUED] naproxen sodium (ANAPROX) 550 MG tablet Take 550 mg by mouth 2 (two) times daily with a meal.      . [DISCONTINUED] valsartan (DIOVAN) 160 MG tablet Take 160 mg by mouth daily.        EXAM:  BP 150/100  Pulse 87  Temp 97.7 F (36.5 C) (Oral)  Wt 292 lb (132.45 kg)  SpO2 97%  LMP 08/21/2012  There is no height on file to calculate BMI.  GENERAL: vitals reviewed and listed above, alert, oriented, appears well hydrated and in no acute distress She  is hoarse and somewhat congested but in no acute distress HEENT: atraumatic, conjunctiva  clear, no obvious abnormalities on inspection of external nose and ears congestion no discharge face nontender OP : no lesion edema or exudate tonsils +1 airway adequate.  NECK: no obvious masses on inspection palpation   LUNGS: clear to auscultation bilaterally, no wheezes, rales or rhonchi, good air movement  CV: HRRR, no clubbing cyanosis or  peripheral edema nl cap refill   MS: moves all extremities without noticeable acute focal  abnormality  PSYCH: pleasant and cooperative, no obvious depression or anxiety  ASSESSMENT AND PLAN:  Discussed the following assessment and plan:  1. Pill esophagitis   2. Rheumatoid arthritis   3. Chronic laryngitis    Possible reflux related  4. HYPERTENSION    Not yet controlled having a part-time taking medication because  of dysphasia  5. Dysphagia     improved but continues   Hasn't begun the omeprazole yet because the pills are too large encouraged her to do that once her dysphagia is better. She is to followup with Dr. Jenne Pane in a few months. Would get GI involved or some imaging if her throat symptoms are not proving in the next week or so however they're much better than when I saw her last. -Patient advised to return or notify health care team  immediately if symptoms worsen or persist or new concerns arise.  Patient Instructions  Continue  Prevacid and can take twice a day for a few days  Try to take the bp medication as we discussed.  Cant tell if it will work  Hopefully with a bit more time this should get better .   If  The solid swallowing is not improved in another week we will Get GI to see you.  ROV in a month .  taek monistat if needed if gets a yeast infection      Burna Mortimer K. Shyna Duignan M.D. Total visit > 50% spent counseling and coordinating care

## 2012-09-18 HISTORY — PX: CYST EXCISION: SHX5701

## 2012-11-21 DIAGNOSIS — L810 Postinflammatory hyperpigmentation: Secondary | ICD-10-CM | POA: Insufficient documentation

## 2012-11-21 DIAGNOSIS — L700 Acne vulgaris: Secondary | ICD-10-CM | POA: Insufficient documentation

## 2013-01-23 ENCOUNTER — Telehealth: Payer: Self-pay | Admitting: Internal Medicine

## 2013-01-23 NOTE — Telephone Encounter (Signed)
Pt was prescribed CIMIZA 400 MG by Dr Drusilla Kanner and pt would like to make sure there's no interaction with her BP med lisinopril-HCTZ. Pt is aware MD out of office this afternoon

## 2013-01-24 NOTE — Telephone Encounter (Signed)
I am not aware of interaction will have t look this up.

## 2013-01-28 ENCOUNTER — Other Ambulatory Visit (HOSPITAL_COMMUNITY): Payer: Self-pay | Admitting: Rheumatology

## 2013-01-28 ENCOUNTER — Ambulatory Visit (HOSPITAL_COMMUNITY)
Admission: RE | Admit: 2013-01-28 | Discharge: 2013-01-28 | Disposition: A | Discharge status: Home / Self Care | Payer: Commercial Managed Care - PPO | Source: Ambulatory Visit | Attending: Rheumatology | Admitting: Rheumatology

## 2013-01-28 DIAGNOSIS — D849 Immunodeficiency, unspecified: Secondary | ICD-10-CM

## 2013-01-28 DIAGNOSIS — D899 Disorder involving the immune mechanism, unspecified: Secondary | ICD-10-CM | POA: Insufficient documentation

## 2013-02-11 ENCOUNTER — Other Ambulatory Visit (HOSPITAL_BASED_OUTPATIENT_CLINIC_OR_DEPARTMENT_OTHER): Payer: Self-pay | Admitting: Orthopaedic Surgery

## 2013-02-11 DIAGNOSIS — M25562 Pain in left knee: Secondary | ICD-10-CM

## 2013-02-15 ENCOUNTER — Ambulatory Visit (HOSPITAL_BASED_OUTPATIENT_CLINIC_OR_DEPARTMENT_OTHER)
Admission: RE | Admit: 2013-02-15 | Discharge: 2013-02-15 | Disposition: A | Discharge status: Home / Self Care | Payer: Commercial Managed Care - PPO | Source: Ambulatory Visit | Attending: Orthopaedic Surgery | Admitting: Orthopaedic Surgery

## 2013-02-15 DIAGNOSIS — M25469 Effusion, unspecified knee: Secondary | ICD-10-CM | POA: Insufficient documentation

## 2013-02-15 DIAGNOSIS — M7120 Synovial cyst of popliteal space [Baker], unspecified knee: Secondary | ICD-10-CM | POA: Insufficient documentation

## 2013-02-15 DIAGNOSIS — M9420 Chondromalacia, unspecified site: Secondary | ICD-10-CM | POA: Insufficient documentation

## 2013-02-15 DIAGNOSIS — M25562 Pain in left knee: Secondary | ICD-10-CM

## 2013-02-15 DIAGNOSIS — M25569 Pain in unspecified knee: Secondary | ICD-10-CM | POA: Insufficient documentation

## 2013-02-18 ENCOUNTER — Telehealth: Payer: Self-pay | Admitting: Family Medicine

## 2013-02-18 NOTE — Telephone Encounter (Signed)
Dr. Fabian Sharp would like to see this patient in the office for a bp check.  Please make appt with the pt.  Thanks!!

## 2013-02-19 NOTE — Telephone Encounter (Signed)
appt made/kh 

## 2013-02-25 ENCOUNTER — Ambulatory Visit (INDEPENDENT_AMBULATORY_CARE_PROVIDER_SITE_OTHER): Payer: Commercial Managed Care - PPO | Admitting: Internal Medicine

## 2013-02-25 ENCOUNTER — Encounter: Payer: Self-pay | Admitting: Internal Medicine

## 2013-02-25 VITALS — BP 170/100 | HR 81 | Temp 98.3°F | Wt 285.2 lb

## 2013-02-25 DIAGNOSIS — R51 Headache: Secondary | ICD-10-CM

## 2013-02-25 DIAGNOSIS — M069 Rheumatoid arthritis, unspecified: Secondary | ICD-10-CM

## 2013-02-25 DIAGNOSIS — L508 Other urticaria: Secondary | ICD-10-CM

## 2013-02-25 DIAGNOSIS — I10 Essential (primary) hypertension: Secondary | ICD-10-CM

## 2013-02-25 MED ORDER — LISINOPRIL-HYDROCHLOROTHIAZIDE 20-12.5 MG PO TABS: 2.0000 | ORAL_TABLET | Freq: Every day | ORAL | Status: DC

## 2013-02-25 NOTE — Progress Notes (Signed)
Chief Complaint  Patient presents with  . Follow-up    BP check.  Needs a medication for her migraines.  Would like Imitrex.    HPI: Comes in as requested to fu on HT. Is currently under active rx for RA .   Bp up  Concern      Taking med denies se of med   Had sot of cortisone lft knee.   Taking ibuprofen   Bid   For pain.     Ha some migraines    Often     Gets hives     Also uncertain if from meds or now but does predates the meds. For bp    Doxepin  Works   Pops up at time  Extremities.  Only with dermatographia.  Sleep  Is difficult   ROS: See pertinent positives and negatives per HPI. No cp sob syncope bleeding recently   Past Medical History  Diagnosis Date  . Hypertension   . Breast abscess of female     Recurrent  . Headache(784.0)   . Fibroids     w/bleeding  . Recurrent periodic urticaria     No family history on file.  History   Social History  . Marital Status: Single    Spouse Name: N/A    Number of Children: N/A  . Years of Education: N/A   Social History Main Topics  . Smoking status: Never Smoker   . Smokeless tobacco: None  . Alcohol Use: Yes     Comment: occ  . Drug Use:   . Sexually Active:    Other Topics Concern  . None   Social History Narrative  . None    Outpatient Encounter Prescriptions as of 02/25/2013  Medication Sig Dispense Refill  . Certolizumab Pegol (CIMZIA PREFILLED [place]) Inject 400 mg into the skin every 30 (thirty) days.      . Cholecalciferol (VITAMIN D3) 2000 UNITS TABS Take by mouth.      . cyanocobalamin 2000 MCG tablet Take 2,000 mcg by mouth daily.      [name] doxepin (SINEQUAN) 10 MG capsule Take 10 mg by mouth.      . hydroxychloroquine (PLAQUENIL) 200 MG tablet Take 200 mg by mouth 2 (two) times daily.      [name] ibuprofen (ADVIL,MOTRIN) 800 MG tablet Take 800 mg by mouth 2 (two) times daily.      [name] lisinopril-hydrochlorothiazide (PRINZIDE) 20-12.5 MG per tablet Take 2 tablets by mouth daily.  180 tablet  1  .  Multiple Vitamin (MULTI-VITAMIN DAILY PO) Take by mouth.      . [DISCONTINUED] lisinopril-hydrochlorothiazide (PRINZIDE) 20-12.5 MG per tablet Take 1 tablet by mouth daily.  90 tablet  1  . fluticasone (FLONASE) 50 MCG/ACT nasal spray Place 2 sprays into the nose daily.  16 g  6  . [DISCONTINUED] lansoprazole (PREVACID SOLUTAB) 30 MG disintegrating tablet Take 1 tablet (30 mg total) by mouth daily.  30 tablet  0  . [DISCONTINUED] levofloxacin (LEVAQUIN) 25 MG/ML solution Take 500 mg by mouth daily.      . [DISCONTINUED] omeprazole (PRILOSEC) 40 MG capsule Take 40 mg by mouth 2 (two) times daily.      . [DISCONTINUED] sucralfate (CARAFATE) 1 GM/10ML suspension Take 10 mLs (1 g total) by mouth 4 (four) times daily.  420 mL  0   No facility-administered encounter medications on file as of 02/25/2013.    EXAM:  BP 170/100  Pulse 81  Temp(Src) 98.3 F (36.8 C) (  Oral)  Wt 285 lb 3.2 oz (129.366 kg)  BMI 48.93 kg/m2  SpO2 98%  LMP 01/24/2013  Body mass index is 48.93 kg/(m^2).  GENERAL: vitals reviewed and listed above, alert, oriented, appears well hydrated and in no acute distress HEENT: atraumatic, conjunctiva  clear, no obvious abnormalities on inspection of external nose and ears OP : no lesion edema or exudate  NECK: no obvious masses on inspection palpation  No adneopathy  LUNGS: clear to auscultation bilaterally, no wheezes, rales or rhonchi, good air movement CV: HRRR, no clubbing cyanosis or  peripheral edema nl cap refill  Repeat 160/100 - 90 large cuff sitting Skin no active hives or rash MS: moves all extremities  noacute redness PSYCH: pleasant and cooperative, no obvious depression or anxiety  ASSESSMENT AND PLAN:  Discussed the following assessment and plan:  HYPERTENSION - not controlled many factors   Rheumatoid arthritis  HEADACHE - migraines  oicass triggers caff   periods   Recurrent periodic urticaria  Complex disease    No hx of osa  Has insomnia  Hx of  focal hives ? Cause  RA autoimmune disease  -Patient advised to return or notify health care team  if symptoms worsen or persist or new concerns arise. FU in 1 month or so  Told to not get pregnant on current meds  For bp  Using condoms   Patient Instructions  Increase  Lisinopril hctz  Take 2 per day  For now  . May need  To add other meds  Such as  Verapamil that can decrease migraines in some people.  Ibuprofen.  Can  Interfere with Bp meds  Sometimes   Need  Chemistry panel to check kidney function  When you have blood test.   callendar headaches  caffiene and hormonal swings are common  Triggers . At this time take the ibuprofen   . Would not use the triptans   Until bp is better.   Get a bp monitor     That you can use and bring in to next visit.    Consider  iud for prg prevention and ? If will help menstrual migraines ?     Neta Mends. Panosh M.D.

## 2013-02-25 NOTE — Patient Instructions (Addendum)
Increase  Lisinopril hctz  Take 2 per day  For now  . May need  To add other meds  Such as  Verapamil that can decrease migraines in some people.  Ibuprofen.  Can  Interfere with Bp meds  Sometimes   Need  Chemistry panel to check kidney function  When you have blood test.   callendar headaches  caffiene and hormonal swings are common  Triggers . At this time take the ibuprofen   . Would not use the triptans   Until bp is better.   Get a bp monitor     That you can use and bring in to next visit.    Consider  iud for prg prevention and ? If will help menstrual migraines ?

## 2013-02-27 ENCOUNTER — Encounter: Payer: Self-pay | Admitting: Internal Medicine

## 2013-02-27 DIAGNOSIS — L508 Other urticaria: Secondary | ICD-10-CM | POA: Insufficient documentation

## 2013-02-28 ENCOUNTER — Other Ambulatory Visit: Payer: Self-pay | Admitting: Internal Medicine

## 2013-02-28 LAB — BASIC METABOLIC PANEL
BUN: 10 mg/dL (ref 6–23)
CO2: 25 mEq/L (ref 19–32)
Calcium: 9.2 mg/dL (ref 8.4–10.5)
Glucose, Bld: 92 mg/dL (ref 70–99)

## 2013-03-05 ENCOUNTER — Encounter: Payer: Self-pay | Admitting: Family Medicine

## 2013-03-27 ENCOUNTER — Ambulatory Visit: Payer: Commercial Managed Care - PPO | Admitting: Internal Medicine

## 2013-04-01 ENCOUNTER — Ambulatory Visit (INDEPENDENT_AMBULATORY_CARE_PROVIDER_SITE_OTHER): Payer: Commercial Managed Care - PPO | Admitting: Internal Medicine

## 2013-04-01 ENCOUNTER — Encounter: Payer: Self-pay | Admitting: Internal Medicine

## 2013-04-01 VITALS — BP 138/86 | HR 79 | Temp 98.2°F | Wt 289.0 lb

## 2013-04-01 DIAGNOSIS — Z3169 Encounter for other general counseling and advice on procreation: Secondary | ICD-10-CM

## 2013-04-01 DIAGNOSIS — M069 Rheumatoid arthritis, unspecified: Secondary | ICD-10-CM

## 2013-04-01 DIAGNOSIS — R51 Headache: Secondary | ICD-10-CM

## 2013-04-01 DIAGNOSIS — I10 Essential (primary) hypertension: Secondary | ICD-10-CM

## 2013-04-01 MED ORDER — LISINOPRIL-HYDROCHLOROTHIAZIDE 20-12.5 MG PO TABS: 2.0000 | ORAL_TABLET | Freq: Every day | ORAL | Status: DC

## 2013-04-01 NOTE — Progress Notes (Signed)
Chief Complaint  Patient presents with  . Follow-up    HPI: Patient comes in today for follow up of  multiple medical problems.  Headaches better  BP 128/90 at work recently  Due for injection.  Want sto get pregnant still thinking of this aks about med etc .  Joints are problematic  .  Last assessment and plan : HYPERTENSION - not controlled many factors  Rheumatoid arthritis  HEADACHE - migraines oicass triggers caff periods  Recurrent periodic urticaria  Complex disease  No hx of osa Has insomnia  Hx of focal hives ? Cause  RA autoimmune disease  -Patient advised to return or notify health care team if symptoms worsen or persist or new concerns arise.  FU in 1 month or so  Told to not get pregnant on current meds For bp Using condoms  Patient Instructions   Increase Lisinopril hctz Take 2 per day For now .  May need To add other meds Such as Verapamil that can decrease migraines in some people.  Ibuprofen. Can Interfere with Bp meds Sometimes  Need Chemistry panel to check kidney function When you have blood test.  callendar headaches caffiene and hormonal swings are common Triggers . At this time take the ibuprofen . Would not use the triptans Until bp is better.  Get a bp monitor That you can use and bring in to next visit.  Consider iud for prg prevention and ? If will help menstrual migraines ?     ROS: See pertinent positives and negatives per HPI. No current cp sob syncope fever   Past Medical History  Diagnosis Date  . Hypertension   . Breast abscess of female     Recurrent  . Headache(784.0)   . Fibroids     w/bleeding  . Recurrent periodic urticaria   . Pill esophagitis 08/28/2012    amoxicillin  by hx  disc plan nl voice except hoarse not drooling  close fu  if not getting better with plan stop aleve  liquid ibu onlyf necessary   . Acute otitis media 08/28/2012    improved  change to liquid medication     No family history on file.  History    Social History  . Marital Status: Single    Spouse Name: N/A    Number of Children: N/A  . Years of Education: N/A   Social History Main Topics  . Smoking status: Never Smoker   . Smokeless tobacco: None  . Alcohol Use: Yes     Comment: occ  . Drug Use:   . Sexually Active:    Other Topics Concern  . None   Social History Narrative  . None    Outpatient Encounter Prescriptions as of 04/01/2013  Medication Sig Dispense Refill  . Certolizumab Pegol (CIMZIA PREFILLED [place]) Inject 400 mg into the skin every 30 (thirty) days.      . Cholecalciferol (VITAMIN D3) 2000 UNITS TABS Take by mouth.      . cyanocobalamin 2000 MCG tablet Take 2,000 mcg by mouth daily.      [name] doxepin (SINEQUAN) 10 MG capsule Take 10 mg by mouth.      . fluticasone (FLONASE) 50 MCG/ACT nasal spray Place 2 sprays into the nose daily.  16 g  6  . hydroxychloroquine (PLAQUENIL) 200 MG tablet Take 200 mg by mouth 2 (two) times daily.      [name] ibuprofen (ADVIL,MOTRIN) 800 MG tablet Take 800 mg by mouth 2 (two) times daily.      [name]  lisinopril-hydrochlorothiazide (PRINZIDE) 20-12.5 MG per tablet Take 2 tablets by mouth daily.  180 tablet  1  . Multiple Vitamin (MULTI-VITAMIN DAILY PO) Take by mouth.      . [DISCONTINUED] lisinopril-hydrochlorothiazide (PRINZIDE) 20-12.5 MG per tablet Take 2 tablets by mouth daily.  180 tablet  1   No facility-administered encounter medications on file as of 04/01/2013.    EXAM:  BP 138/86  Pulse 79  Temp(Src) 98.2 F (36.8 C) (Oral)  Wt 289 lb (131.09 kg)  BMI 49.58 kg/m2  SpO2 98%  LMP 03/18/2013  Body mass index is 49.58 kg/(m^2).  GENERAL: vitals reviewed and listed above, alert, oriented, appears well hydrated and in no acute distress  HEENT: atraumatic, conjunctiva  clear, no obvious abnormalities on inspection of external nose and ears  NECK: no obvious masses on inspection palpation  LUNGS: clear to auscultation bilaterally, no wheezes, rales or rhonchi, good air  movement CV: HRRR, no clubbing cyanosis or  peripheral edema nl cap refill  MS: moves all extremities without noticeable focal  Abnormality mild swelling no redness PSYCH: pleasant and cooperative, no obvious depression or anxiety Lab Results  Component Value Date   WBC 4.9 10/29/2009   HGB 14.3 01/12/2012   HCT 42.0 01/12/2012   PLT 322.0 10/29/2009   GLUCOSE 92 02/28/2013   ALT 14 10/29/2009   AST 20 10/29/2009   NA 135 02/28/2013   K 4.0 02/28/2013   CL 102 02/28/2013   CREATININE 0.88 02/28/2013   BUN 10 02/28/2013   CO2 25 02/28/2013    ; ASSESSMENT AND PLAN:  Discussed the following assessment and plan:  HYPERTENSION - meds not to be taken i preg   Rheumatoid arthritis - problematic under care cimzia   HEADACHE - some improved   Pre-conception counseling Disc about meds in pregnancy  And problems more with the ra meds also  . Needs to discuss with ob and  Rheum. We can always change bp medications pre conceptually if needed.    comzia is category b but  High risk for infections. Folate etc  Reviewed labs  -Patient advised to return or notify health care team  if symptoms worsen or persist or new concerns arise.  Patient Instructions  bp is better today    138/86 range   Not a medication to use in pregnancy.   Aldomet  And labetolol are commonly used medication in pregnancy for blood pressure.  Get back with me   About this   Otherwise . ROV in 3-4 months     Siyana Erney K. Audreana Hancox M.D.

## 2013-04-01 NOTE — Patient Instructions (Signed)
bp is better today    138/86 range   Not a medication to use in pregnancy.   Aldomet  And labetolol are commonly used medication in pregnancy for blood pressure.  Get back with me   About this   Otherwise . ROV in 3-4 months

## 2013-04-05 ENCOUNTER — Encounter: Payer: Self-pay | Admitting: Internal Medicine

## 2013-11-24 ENCOUNTER — Ambulatory Visit (INDEPENDENT_AMBULATORY_CARE_PROVIDER_SITE_OTHER): Payer: Commercial Managed Care - PPO | Admitting: Family Medicine

## 2013-11-24 ENCOUNTER — Encounter: Payer: Self-pay | Admitting: Family Medicine

## 2013-11-24 VITALS — BP 138/80 | Temp 98.0°F | Wt 286.0 lb

## 2013-11-24 DIAGNOSIS — J029 Acute pharyngitis, unspecified: Secondary | ICD-10-CM

## 2013-11-24 NOTE — Progress Notes (Signed)
Pre visit review using our clinic review tool, if applicable. No additional management support is needed unless otherwise documented below in the visit note. 

## 2013-11-24 NOTE — Progress Notes (Signed)
Chief Complaint  Patient presents with  . Sore Throat    fever, body aches, chills,     HPI:   Started 3 days ago: -niece sick too -symptoms: sore throat, low grade temp (now resolved), nasal congestion, cough -denies: NVD, SOB, tooth pain, flu exposure, ebola risks  ROS: See pertinent positives and negatives per HPI.  Past Medical History  Diagnosis Date  . Hypertension   . Breast abscess of female     Recurrent  . Headache(784.0)   . Fibroids     w/bleeding  . Recurrent periodic urticaria   . Pill esophagitis 08/28/2012    amoxicillin  by hx  disc plan nl voice except hoarse not drooling  close fu  if not getting better with plan stop aleve  liquid ibu onlyf necessary   . Acute otitis media 08/28/2012    improved  change to liquid medication     Past Surgical History  Procedure Laterality Date  . Breast abscess  2003    removal    No family history on file.  History   Social History  . Marital Status: Single    Spouse Name: N/A    Number of Children: N/A  . Years of Education: N/A   Social History Main Topics  . Smoking status: Never Smoker   . Smokeless tobacco: None  . Alcohol Use: Yes     Comment: occ  . Drug Use:   . Sexual Activity:    Other Topics Concern  . None   Social History Narrative  . None    Current outpatient prescriptions:celecoxib (CELEBREX) 200 MG capsule, Take 200 mg by mouth 2 (two) times daily., Disp: , Rfl: ;  Certolizumab Pegol (CIMZIA PREFILLED [place]), Inject 400 mg into the skin every 30 (thirty) days., Disp: , Rfl: ;  Cholecalciferol (VITAMIN D3) 2000 UNITS TABS, Take by mouth., Disp: , Rfl: ;  cyanocobalamin 2000 MCG tablet, Take 2,000 mcg by mouth daily., Disp: , Rfl:  doxepin (SINEQUAN) 10 MG capsule, Take 10 mg by mouth., Disp: , Rfl: ;  hydroxychloroquine (PLAQUENIL) 200 MG tablet, Take 200 mg by mouth 2 (two) times daily., Disp: , Rfl: ;  labetalol (NORMODYNE) 100 MG tablet, Take 100 mg by mouth once., Disp: , Rfl: ;   Multiple Vitamin (MULTI-VITAMIN DAILY PO), Take by mouth., Disp: , Rfl: ;  fluticasone (FLONASE) 50 MCG/ACT nasal spray, Place 2 sprays into the nose daily., Disp: 16 g, Rfl: 6 ibuprofen (ADVIL,MOTRIN) 800 MG tablet, Take 800 mg by mouth 2 (two) times daily., Disp: , Rfl:   EXAM:  Filed Vitals:   11/24/13 0917  BP: 138/80  Temp: 98 F (36.7 C)    Body mass index is 49.07 kg/(m^2).  GENERAL: vitals reviewed and listed above, alert, oriented, appears well hydrated and in no acute distress  HEENT: atraumatic, conjunttiva clear, no obvious abnormalities on inspection of external nose and ears, normal appearance of ear canals and TMs, clear nasal congestion, mild post oropharyngeal erythema with PND, 1 + tonsillar edema with exudate, no sinus TTP  NECK: no obvious masses on inspection  LUNGS: clear to auscultation bilaterally, no wheezes, rales or rhonchi, good air movement  CV: HRRR, no peripheral edema  MS: moves all extremities without noticeable abnormality  PSYCH: pleasant and cooperative, no obvious depression or anxiety  ASSESSMENT AND PLAN:  Discussed the following assessment and plan:  Sore throat - Plan: POC Rapid Strep A, Throat culture ([facility])  -symptoms more c/w VURI but given exam  will check for strep and if + tx with amoxicillin (rapid neg) -supportive care and return precautions otherwise -Patient advised to return or notify a doctor immediately if symptoms worsen or persist or new concerns arise.  There are no Patient Instructions on file for this visit.   Colin Benton R.

## 2013-11-24 NOTE — Patient Instructions (Signed)

## 2013-11-27 ENCOUNTER — Telehealth: Payer: Self-pay | Admitting: Internal Medicine

## 2013-11-27 DIAGNOSIS — R059 Cough, unspecified: Secondary | ICD-10-CM

## 2013-11-27 DIAGNOSIS — R05 Cough: Secondary | ICD-10-CM

## 2013-11-27 LAB — CULTURE, GROUP A STREP

## 2013-11-27 MED ORDER — HYDROCOD POLST-CHLORPHEN POLST 10-8 MG/5ML PO LQCR: 5.0000 mL | Freq: Two times a day (BID) | ORAL | Status: DC | PRN

## 2013-11-27 NOTE — Telephone Encounter (Signed)
Ok. Rx printed to send.can make you drowsy so use caution.

## 2013-11-27 NOTE — Telephone Encounter (Signed)
Pt saw dr Maudie Mercury on Monday was dx with uri and told to buy some delysm otc. Pt stated delysm is not working would like cough med with codeine.

## 2013-11-27 NOTE — Telephone Encounter (Signed)
Pt aware rx ready for pick up. 

## 2013-11-28 MED ORDER — AMOXICILLIN 875 MG PO TABS: 875.0000 mg | ORAL_TABLET | Freq: Two times a day (BID) | ORAL | Status: DC

## 2013-11-28 NOTE — Addendum Note (Signed)
Addended by: Colleen Can on: 11/28/2013 11:28 AM   Modules accepted: Orders

## 2013-12-12 ENCOUNTER — Ambulatory Visit (INDEPENDENT_AMBULATORY_CARE_PROVIDER_SITE_OTHER): Payer: Commercial Managed Care - PPO | Admitting: Internal Medicine

## 2013-12-12 ENCOUNTER — Encounter: Payer: Self-pay | Admitting: Internal Medicine

## 2013-12-12 ENCOUNTER — Ambulatory Visit (HOSPITAL_COMMUNITY)
Admission: RE | Admit: 2013-12-12 | Discharge: 2013-12-12 | Disposition: A | Discharge status: Home / Self Care | Payer: Commercial Managed Care - PPO | Source: Ambulatory Visit | Attending: Internal Medicine | Admitting: Internal Medicine

## 2013-12-12 VITALS — BP 164/110 | HR 82 | Temp 98.5°F | Wt 285.0 lb

## 2013-12-12 DIAGNOSIS — B9789 Other viral agents as the cause of diseases classified elsewhere: Secondary | ICD-10-CM

## 2013-12-12 DIAGNOSIS — R05 Cough: Secondary | ICD-10-CM | POA: Insufficient documentation

## 2013-12-12 DIAGNOSIS — R053 Chronic cough: Secondary | ICD-10-CM

## 2013-12-12 DIAGNOSIS — I10 Essential (primary) hypertension: Secondary | ICD-10-CM

## 2013-12-12 DIAGNOSIS — Z79899 Other long term (current) drug therapy: Secondary | ICD-10-CM

## 2013-12-12 DIAGNOSIS — J069 Acute upper respiratory infection, unspecified: Secondary | ICD-10-CM | POA: Insufficient documentation

## 2013-12-12 DIAGNOSIS — Z8709 Personal history of other diseases of the respiratory system: Secondary | ICD-10-CM | POA: Insufficient documentation

## 2013-12-12 DIAGNOSIS — M069 Rheumatoid arthritis, unspecified: Secondary | ICD-10-CM

## 2013-12-12 DIAGNOSIS — R059 Cough, unspecified: Secondary | ICD-10-CM | POA: Insufficient documentation

## 2013-12-12 DIAGNOSIS — M7989 Other specified soft tissue disorders: Secondary | ICD-10-CM

## 2013-12-12 MED ORDER — HYDROCOD POLST-CHLORPHEN POLST 10-8 MG/5ML PO LQCR: 5.0000 mL | Freq: Two times a day (BID) | ORAL | Status: DC | PRN

## 2013-12-12 MED ORDER — AZITHROMYCIN 250 MG PO TABS: 250.0000 mg | ORAL_TABLET | ORAL | Status: DC

## 2013-12-12 NOTE — Patient Instructions (Addendum)
   Check bp readings   Don't stop the blood pressure medication.   Stop the decongestant tpo help with the BP.  If conrtinues elevaetd we need to adjust the medication.  Uncertain why you have left swollen leg  ? Bakers  cyst  R/O  Clotting .   The cough seems like a viral infection that should reolve on its own but because of you risk we need to get a chest x ray to make sure you dont have pneumonia .   Antibiotic wont help if virus but we may add antibiotic incase  This could be early bacterial infection.  Keep appt with Dr D let  You check your left leg .  rov in 1 -2 weeks if not better .

## 2013-12-12 NOTE — Progress Notes (Signed)
Chief Complaint  Patient presents with  . Cough  . Sore Throat  . Hoarse    HPI: Patient comes in today for SDA for a number of problem evaluation.  Complaining of significant cough hoarseness that is worse above her baseline hard to sleep cough medicine not as helpful sore throat. Seen dr Maudie Mercury 3 9 for sore throat viral  amox if needed strep neg but non group a strep eventually took amoxicillin Is taking cough medicine and over-the-counter TheraFlu Didn't take medicine  Yesterday and then theraflu  No help.  Intake her blood pressure medicine today generally her blood pressure has been controlled according to her. She is under treatment for rheumatoid arthritis to get an injection next week last one was delayed because she was sick on an antibiotic she is on [place]. She's had injections in both knees recently. She's had about a week or more of left ankle leg swelling without pain falling or injury he did she's never had before. Was going to come in for that when she got sicker again decreases slightly when she elevates otherwise is continuing. Right leg is fine. ROS: See pertinent positives and negatives per HPI. Negative for active shortness of breath active wheezing unusual bleeding today. Her fever is now gone but was very her last visit history she's continued to work through this illness.  Past Medical History  Diagnosis Date  . Hypertension   . Breast abscess of female     Recurrent  . Headache(784.0)   . Fibroids     w/bleeding  . Recurrent periodic urticaria   . Pill esophagitis 08/28/2012    amoxicillin  by hx  disc plan nl voice except hoarse not drooling  close fu  if not getting better with plan stop aleve  liquid ibu onlyf necessary   . Acute otitis media 08/28/2012    improved  change to liquid medication     No family history on file.  History   Social History  . Marital Status: Single    Spouse Name: N/A    Number of Children: N/A  . Years of Education: N/A    Social History Main Topics  . Smoking status: Never Smoker   . Smokeless tobacco: None  . Alcohol Use: Yes     Comment: occ  . Drug Use:   . Sexual Activity:    Other Topics Concern  . None   Social History Narrative  . None    Outpatient Encounter Prescriptions as of 12/12/2013  Medication Sig  . celecoxib (CELEBREX) 200 MG capsule Take 200 mg by mouth 2 (two) times daily.  . Certolizumab Pegol (CIMZIA PREFILLED [place]) Inject 400 mg into the skin every 30 (thirty) days.  . Cholecalciferol (VITAMIN D3) 2000 UNITS TABS Take by mouth.  . cyanocobalamin 2000 MCG tablet Take 2,000 mcg by mouth daily.  [name] doxepin (SINEQUAN) 10 MG capsule Take 10 mg by mouth.  . fluticasone (FLONASE) 50 MCG/ACT nasal spray Place 2 sprays into the nose daily.  . hydroxychloroquine (PLAQUENIL) 200 MG tablet Take 200 mg by mouth 2 (two) times daily.  [name] ibuprofen (ADVIL,MOTRIN) 800 MG tablet Take 800 mg by mouth 2 (two) times daily.  [name] labetalol (NORMODYNE) 100 MG tablet Take 100 mg by mouth once.  . Multiple Vitamin (MULTI-VITAMIN DAILY PO) Take by mouth.  [name] azithromycin (ZITHROMAX Z-PAK) 250 MG tablet Take 1 tablet (250 mg total) by mouth as directed. Take 2 po first day, then 1 po qd  .  chlorpheniramine-HYDROcodone (TUSSIONEX PENNKINETIC ER) 10-8 MG/5ML LQCR Take 5 mLs by mouth every 12 (twelve) hours as needed.  . [DISCONTINUED] amoxicillin (AMOXIL) 875 MG tablet Take 1 tablet (875 mg total) by mouth 2 (two) times daily.  . [DISCONTINUED] chlorpheniramine-HYDROcodone (TUSSIONEX PENNKINETIC ER) 10-8 MG/5ML LQCR Take 5 mLs by mouth every 12 (twelve) hours as needed.    EXAM:  BP 164/110  Pulse 82  Temp(Src) 98.5 F (36.9 C) (Oral)  Wt 285 lb (129.275 kg)  SpO2 98%  LMP 12/10/2013  Body mass index is 48.9 kg/(m^2). WDWN in NAD  quiet respirations; mildly congested  VERY hoarse. Non toxic . Her to talk at times because of hoarseness and coughing between nostridor  HEENT: Normocephalic ;atraumatic ,  Eyes;  PERRL, EOMs  Full, lids and conjunctiva clear,,Ears: no deformities, canals nl, TM landmarks normal, Nose: no deformity or discharge but congested;face minimally tender Mouth : OP clear without lesion or edema . +1 red tonsils +1 no exudate Neck: Supple without adenopathy or masses or bruits Chest:  Clear to A&P without wheezes rales or rhonchi? CV:  S1-S2 no gallops or murmurs peripheral perfusion is normal Skin :nl perfusion and no acute rashes  Left lower extremity +2 to +3 edema around the ankle and up to the mid tibia right ankle is normal size and slight pretibial edema there are no cords or redness Homans sign tenderness in the pulses intact.  ASSESSMENT AND PLAN:  Discussed the following assessment and plan:  Cough, persistent - virla allergic  consdier atypical germs mycoplasma etc exposure to pnailness for 3 weeks  - Plan: DG Chest 2 View  Rheumatoid arthritis(714.0) - under rx delaye injection from illness - Plan: DG Chest 2 View, US Venous Img Lower Unilateral Left  High risk medication use - Plan: DG Chest 2 View, US Venous Img Lower Unilateral Left  Left leg swelling - ? cause  get doppler and have dr D check thios  fu depending on results and how she is doing - Plan: US Venous Img Lower Unilateral Left  Cough - Plan: chlorpheniramine-HYDROcodone (TUSSIONEX PENNKINETIC ER) 10-8 MG/5ML LQCR  HYPERTENSION - up today stop deong checkreadings after taking BPmeds   -Patient advised to return or notify health care team  if symptoms worsen ,persist or new concerns arise.  Patient Instructions   Check bp readings   Don't stop the blood pressure medication.   Stop the decongestant tpo help with the BP.  If conrtinues elevaetd we need to adjust the medication.  Uncertain why you have left swollen leg  ? Bakers  cyst  R/O  Clotting .   The cough seems like a viral infection that should reolve on its own but because of you risk we need to get a chest x ray to make sure you  dont have pneumonia .   Antibiotic wont help if virus but we may add antibiotic incase  This could be early bacterial infection.  Keep appt with Dr D let  You check your left leg .  rov in 1 -2 weeks if not better .       Standley Brooking. Panosh M.D.  Pre visit review using our clinic review tool, if applicable. No additional management support is needed unless otherwise documented below in the visit note.

## 2013-12-15 ENCOUNTER — Encounter (HOSPITAL_COMMUNITY): Payer: Commercial Managed Care - PPO

## 2013-12-15 ENCOUNTER — Other Ambulatory Visit (HOSPITAL_COMMUNITY): Payer: Self-pay | Admitting: Rheumatology

## 2013-12-15 DIAGNOSIS — M7989 Other specified soft tissue disorders: Secondary | ICD-10-CM

## 2013-12-18 ENCOUNTER — Ambulatory Visit (HOSPITAL_COMMUNITY)
Admission: RE | Admit: 2013-12-18 | Discharge: 2013-12-18 | Disposition: A | Discharge status: Home / Self Care | Payer: Commercial Managed Care - PPO | Source: Ambulatory Visit | Attending: Rheumatology | Admitting: Rheumatology

## 2013-12-18 DIAGNOSIS — M79609 Pain in unspecified limb: Secondary | ICD-10-CM

## 2013-12-18 DIAGNOSIS — M7989 Other specified soft tissue disorders: Secondary | ICD-10-CM | POA: Insufficient documentation

## 2013-12-18 NOTE — Progress Notes (Signed)
*  PRELIMINARY RESULTS* Vascular Ultrasound Left lower extremity venous duplex has been completed.  Preliminary findings:  No obvious evidence of DVT in left leg. Left CFV demostrates continuous flow compared to Right CFV. Left baker's cyst noted that appears to be ruptured into calf.  Called results to [facility] at Dr. Army Melia office. Instructed patient to call the office or stop by office to speak with someone about the results.   Landry Mellow, RDMS, RVT  12/18/2013, 9:20 AM

## 2013-12-25 ENCOUNTER — Ambulatory Visit (INDEPENDENT_AMBULATORY_CARE_PROVIDER_SITE_OTHER): Payer: Commercial Managed Care - PPO | Admitting: Internal Medicine

## 2013-12-25 ENCOUNTER — Encounter: Payer: Self-pay | Admitting: Internal Medicine

## 2013-12-25 VITALS — BP 136/96 | Temp 97.7°F | Wt 287.0 lb

## 2013-12-25 DIAGNOSIS — M069 Rheumatoid arthritis, unspecified: Secondary | ICD-10-CM

## 2013-12-25 DIAGNOSIS — M7989 Other specified soft tissue disorders: Secondary | ICD-10-CM

## 2013-12-25 DIAGNOSIS — I10 Essential (primary) hypertension: Secondary | ICD-10-CM

## 2013-12-25 DIAGNOSIS — M660 Rupture of popliteal cyst: Secondary | ICD-10-CM | POA: Insufficient documentation

## 2013-12-25 DIAGNOSIS — M7120 Synovial cyst of popliteal space [Baker], unspecified knee: Secondary | ICD-10-CM

## 2013-12-25 NOTE — Patient Instructions (Addendum)
Will refer  To ortho  Dr Marlou Sa .about the swelling  Bakers cyst. Continuous.  And not better  This is different  Than the rheumatoid issue . Glad  No blood clot .   Baker Cyst A Baker cyst is a sac-like structure that forms in the back of the knee. It is filled with the same fluid that is located in your knee. This fluid lubricates the bones and cartilage of the knee and allows them to move over each other more easily. CAUSES  When the knee becomes injured or inflamed, increased fluid forms in the knee. When this happens, the joint lining is pushed out behind the knee and forms the Baker cyst. This cyst may also be caused by inflammation from arthritic conditions and infections. SIGNS AND SYMPTOMS  A Baker cyst usually has no symptoms. When the cyst is substantially enlarged:  You may feel pressure behind the knee, stiffness in the knee, or a mass in the area behind the knee.  You may develop pain, redness, and swelling in the calf. This can suggest a blood clot and requires evaluation by your health care provider. DIAGNOSIS  A Baker cyst is most often found during an ultrasound exam. This exam may have been performed for other reasons, and the cyst was found incidentally. Sometimes an MRI is used. This picks up other problems within a joint that an ultrasound exam may not. If the Baker cyst developed immediately after an injury, X-ray exams may be used to diagnose the cyst. TREATMENT  The treatment depends on the cause of the cyst. Anti-inflammatory medicines and rest often will be prescribed. If the cyst is caused by a bacterial infection, antibiotic medicines may be prescribed.  HOME CARE INSTRUCTIONS   If the cyst was caused by an injury, for the first 24 hours, keep the injured leg elevated on 2 pillows while lying down.  For the first 24 hours while you are awake, apply ice to the injured area:  Put ice in a plastic bag.  Place a towel between your skin and the bag.  Leave the ice  on for 20 minutes, 2 3 times a day.  Only take over-the-counter or prescription medicines for pain, discomfort, or fever as directed by your health care provider.  Only take antibiotic medicine as directed. Make sure to finish it even if you start to feel better. MAKE SURE YOU:   Understand these instructions.  Will watch your condition.  Will get help right away if you are not doing well or get worse. Document Released: 09/04/2005 Document Revised: 06/25/2013 Document Reviewed: 04/16/2013 [facility] Patient Information 2014 [place].

## 2013-12-25 NOTE — Progress Notes (Signed)
Pre visit review using our clinic review tool, if applicable. No additional management support is needed unless otherwise documented below in the visit note.   Chief Complaint  Patient presents with  . Follow-up    HPI: Fu  Leg swelling  Eventually had doppler  prob ruptured bakers cyst left no dvt. Still quite swollen  ? What to do  Has knee arthritis ra and? OA  Remote hx of knee injections in the past.  No fever redness . BP ok usually  A bit frustrateda bout the leg  To go on vacation in the near future.  ROS: See pertinent positives and negatives per HPI.  Past Medical History  Diagnosis Date  . Hypertension   . Breast abscess of female     Recurrent  . Headache(784.0)   . Fibroids     w/bleeding  . Recurrent periodic urticaria   . Pill esophagitis 08/28/2012    amoxicillin  by hx  disc plan nl voice except hoarse not drooling  close fu  if not getting better with plan stop aleve  liquid ibu onlyf necessary   . Acute otitis media 08/28/2012    improved  change to liquid medication     No family history on file.  History   Social History  . Marital Status: Single    Spouse Name: N/A    Number of Children: N/A  . Years of Education: N/A   Social History Main Topics  . Smoking status: Never Smoker   . Smokeless tobacco: None  . Alcohol Use: Yes     Comment: occ  . Drug Use:   . Sexual Activity:    Other Topics Concern  . None   Social History Narrative  . None    Outpatient Encounter Prescriptions as of 12/25/2013  Medication Sig  . celecoxib (CELEBREX) 200 MG capsule Take 200 mg by mouth 2 (two) times daily.  . Certolizumab Pegol (CIMZIA PREFILLED [place]) Inject 400 mg into the skin every 30 (thirty) days.  . Cholecalciferol (VITAMIN D3) 2000 UNITS TABS Take by mouth.  . cyanocobalamin 2000 MCG tablet Take 2,000 mcg by mouth daily.  . hydroxychloroquine (PLAQUENIL) 200 MG tablet Take 200 mg by mouth 2 (two) times daily.  . IRON PO Take by mouth.  .  labetalol (NORMODYNE) 100 MG tablet Take 100 mg by mouth once.  . Multiple Vitamin (MULTI-VITAMIN DAILY PO) Take by mouth.  . doxepin (SINEQUAN) 10 MG capsule Take 10 mg by mouth.  . fluticasone (FLONASE) 50 MCG/ACT nasal spray Place 2 sprays into the nose daily.  [name] ibuprofen (ADVIL,MOTRIN) 800 MG tablet Take 800 mg by mouth 2 (two) times daily.  . [DISCONTINUED] azithromycin (ZITHROMAX Z-PAK) 250 MG tablet Take 1 tablet (250 mg total) by mouth as directed. Take 2 po first day, then 1 po qd  . [DISCONTINUED] chlorpheniramine-HYDROcodone (TUSSIONEX PENNKINETIC ER) 10-8 MG/5ML LQCR Take 5 mLs by mouth every 12 (twelve) hours as needed.    EXAM:  BP 136/96  Temp(Src) 97.7 F (36.5 C) (Oral)  Wt 287 lb (130.182 kg)  LMP 12/10/2013  Body mass index is 49.24 kg/(m^2).  GENERAL: vitals reviewed and listed above, alert, oriented, appears well hydrated and in no acute distress HEENT: atraumatic, conjunctiva  clear, no obvious abnormalities on inspection of external nose and ears  MS: left leg 2+ edema non red or warmth  Fullness post fossa.  Noted some pain  Antalgic gait  PSYCH: pleasant and cooperative, no obvious depression or anxiety  ASSESSMENT AND PLAN:  Discussed the following assessment and plan:  Baker's cyst, ruptured - seems full again ti think needs to see ortho  - Plan: Ambulatory referral to Orthopedic Surgery  Left leg swelling - Plan: Ambulatory referral to Orthopedic Surgery  Rheumatoid arthritis(714.0)  Unspecified essential hypertension - has been better   -Patient advised to return or notify health care team  if symptoms worsen ,persist or new concerns arise.  Patient Instructions  Will refer  To ortho  Dr Marlou Sa .about the swelling  Bakers cyst. Continuous.  And not better  This is different  Than the rheumatoid issue . Glad  No blood clot .   Baker Cyst A Baker cyst is a sac-like structure that forms in the back of the knee. It is filled with the same fluid that  is located in your knee. This fluid lubricates the bones and cartilage of the knee and allows them to move over each other more easily. CAUSES  When the knee becomes injured or inflamed, increased fluid forms in the knee. When this happens, the joint lining is pushed out behind the knee and forms the Baker cyst. This cyst may also be caused by inflammation from arthritic conditions and infections. SIGNS AND SYMPTOMS  A Baker cyst usually has no symptoms. When the cyst is substantially enlarged:  You may feel pressure behind the knee, stiffness in the knee, or a mass in the area behind the knee.  You may develop pain, redness, and swelling in the calf. This can suggest a blood clot and requires evaluation by your health care provider. DIAGNOSIS  A Baker cyst is most often found during an ultrasound exam. This exam may have been performed for other reasons, and the cyst was found incidentally. Sometimes an MRI is used. This picks up other problems within a joint that an ultrasound exam may not. If the Baker cyst developed immediately after an injury, X-ray exams may be used to diagnose the cyst. TREATMENT  The treatment depends on the cause of the cyst. Anti-inflammatory medicines and rest often will be prescribed. If the cyst is caused by a bacterial infection, antibiotic medicines may be prescribed.  HOME CARE INSTRUCTIONS   If the cyst was caused by an injury, for the first 24 hours, keep the injured leg elevated on 2 pillows while lying down.  For the first 24 hours while you are awake, apply ice to the injured area:  Put ice in a plastic bag.  Place a towel between your skin and the bag.  Leave the ice on for 20 minutes, 2 3 times a day.  Only take over-the-counter or prescription medicines for pain, discomfort, or fever as directed by your health care provider.  Only take antibiotic medicine as directed. Make sure to finish it even if you start to feel better. MAKE SURE YOU:    Understand these instructions.  Will watch your condition.  Will get help right away if you are not doing well or get worse. Document Released: 09/04/2005 Document Revised: 06/25/2013 Document Reviewed: 04/16/2013 [facility] Patient Information 2014 [place].    Standley Brooking. Panosh M.D.

## 2013-12-26 ENCOUNTER — Telehealth: Payer: Self-pay | Admitting: Internal Medicine

## 2013-12-26 DIAGNOSIS — R609 Edema, unspecified: Secondary | ICD-10-CM

## 2013-12-26 NOTE — Telephone Encounter (Signed)
Pt called to say that Dr Garnette Czech suggested that Dr Regis Bill order toe less compression stocking ///   baker's cyst

## 2013-12-26 NOTE — Telephone Encounter (Signed)
Please get patient rx for 20 30 mm compression thigh highs l Dx bakers cyst edema  We can fax this to medical supply or she can come pick up he order and take it herself

## 2013-12-29 DIAGNOSIS — R609 Edema, unspecified: Secondary | ICD-10-CM | POA: Insufficient documentation

## 2013-12-29 MED ORDER — MEDICAL COMPRESSION THIGH HIGH MISC: Status: DC

## 2013-12-29 NOTE — Telephone Encounter (Signed)
Patient notified to pick up at the front desk. 

## 2014-01-01 ENCOUNTER — Other Ambulatory Visit: Payer: Self-pay | Admitting: Orthopaedic Surgery

## 2014-01-01 DIAGNOSIS — M25572 Pain in left ankle and joints of left foot: Secondary | ICD-10-CM

## 2014-01-01 DIAGNOSIS — R609 Edema, unspecified: Secondary | ICD-10-CM

## 2014-01-07 ENCOUNTER — Ambulatory Visit
Admission: RE | Admit: 2014-01-07 | Discharge: 2014-01-07 | Disposition: A | Discharge status: Home / Self Care | Payer: Commercial Managed Care - PPO | Source: Ambulatory Visit | Attending: Orthopaedic Surgery | Admitting: Orthopaedic Surgery

## 2014-01-07 DIAGNOSIS — R609 Edema, unspecified: Secondary | ICD-10-CM

## 2014-01-07 DIAGNOSIS — M25572 Pain in left ankle and joints of left foot: Secondary | ICD-10-CM

## 2014-01-08 ENCOUNTER — Other Ambulatory Visit: Payer: Commercial Managed Care - PPO

## 2014-01-09 ENCOUNTER — Other Ambulatory Visit: Payer: Commercial Managed Care - PPO

## 2014-03-30 ENCOUNTER — Telehealth: Payer: Self-pay | Admitting: Internal Medicine

## 2014-03-30 MED ORDER — RIZATRIPTAN BENZOATE 10 MG PO TBDP: 10.0000 mg | ORAL_TABLET | ORAL | Status: DC | PRN

## 2014-03-30 NOTE — Telephone Encounter (Signed)
i f BP ok ok to rx maxalt 10 mg 1 po prn migraine HA may repeat in 2 hours  Disp 9 . No refills at this time.

## 2014-03-30 NOTE — Telephone Encounter (Signed)
I do not see either in the patient's history.  Please advise.  Thanks!

## 2014-03-30 NOTE — Telephone Encounter (Addendum)
Pt states she has had migraine for 2 days. Dr Regis Bill used to rx either maxalt or imitrex.  Pt not sure wfich one.  But would like to know if dr would call her in one of these [facility]

## 2014-03-30 NOTE — Telephone Encounter (Signed)
Patient notified to pick up rx at the pharmacy.  Stated her BP has been 130's/80's.

## 2014-05-04 ENCOUNTER — Telehealth: Payer: Self-pay | Admitting: Internal Medicine

## 2014-05-04 NOTE — Telephone Encounter (Signed)
Patient Information:  Caller Name: Brigitt  Phone: [phone]  Patient: Jennifer [name], Jennifer [name]  Gender: Female  DOB: May 26, 1976  Age: 38 Years  PCP: [facility])  Pregnant: No  Office Follow Up:  Does the office need to follow up with this patient?: Yes  Instructions For The Office: Needing Refill of Migraine Medication  RN Note:  She is wondering if she needs to see Dr. Domingo Cocking again. Only Maxalt and Immitrex taking headaches away completely. Needs Refill of Maxalt.  Symptoms  Reason For Call & Symptoms: Frequent Headaches every couple of days and is out of Maxalt that was called in last month.  She has a 1/2 inch knot at the back R side of her head that is jelly like- onset 04/03/14. Dry cough for past 2 days, which she gets after taking Cimenza injeuction for arthritis. Requesting refill on the Maxalt.  Reviewed Health History In EMR: Yes  Reviewed Medications In EMR: Yes  Reviewed Allergies In EMR: Yes  Reviewed Surgeries / Procedures: Yes  Date of Onset of Symptoms: 03/05/2014 OB / GYN:  LMP: 04/19/2014  Guideline(s) Used:  Headache  Skin Lesion - Moles or Growths  Disposition Per Guideline:   See Within 2 Weeks in Office  Reason For Disposition Reached:   Caller is uncertain what it is  Advice Given:  Reassurance - Migraine Headache:  You have told me that this headache is similar to previous migraine headaches that you have had. If the pattern or severity of your headache changes, you will need to see your physician.  Migraine headaches are also called vascular headaches. A migraine can be anywhere from mild to severely painful. Sufferers often describe it as throbbing or pulsing. It is often just on one side. Associated symptoms include nausea and vomiting. Some individuals will have visual or other neurological warning symptoms (aura) that a migraine is coming.  Here is some care advice that should help.  Pain Medicines:  For pain relief, you can take either  acetaminophen, ibuprofen, or naproxen.  They are over-the-counter (OTC) pain drugs. You can buy them at the drugstore.  Call Back If:  Headache lasts longer than 24 hours  You become worse.  Rest:   Lie down in a dark, quiet place and try to relax. Close your eyes and imagine your entire body relaxing.  Patient Refused Recommendation:  Patient Requests Prescription  Requesting refill of Maxalt. She will call back to schedule appnt to be seen for non tender lump on scalp.

## 2014-05-04 NOTE — Telephone Encounter (Signed)
Need her to see her headache specials ist she is having increasing  Frequency of headaches .  Do referral if needbe  Ok to refill maxalt x 1 if her Blood pressure is good.

## 2014-05-05 MED ORDER — RIZATRIPTAN BENZOATE 10 MG PO TBDP: 10.0000 mg | ORAL_TABLET | ORAL | Status: DC | PRN

## 2014-05-05 NOTE — Telephone Encounter (Signed)
Pt notified that rx was sent to the pharmacy.  Reminded not to forget to send in bp readings.

## 2014-05-05 NOTE — Telephone Encounter (Signed)
Yes  i agree with plan.

## 2014-05-05 NOTE — Telephone Encounter (Signed)
Spoke to the pt.  She said other than last week her bp has been good.  Received her Certolizumab Pegol (CIMZIA PREFILLED [place]) injection for her arthritis.  Thinks that is why her bp was high last week.  Will fax a copy of her bp readings for the past month.  Headache is good today.  Advised that she make an appt with specialist for headaches.  Okay to fill Maxalt once?

## 2014-06-15 ENCOUNTER — Other Ambulatory Visit: Payer: Self-pay | Admitting: Family Medicine

## 2014-07-31 ENCOUNTER — Ambulatory Visit (INDEPENDENT_AMBULATORY_CARE_PROVIDER_SITE_OTHER): Payer: Commercial Managed Care - PPO | Admitting: Internal Medicine

## 2014-07-31 ENCOUNTER — Encounter: Payer: Self-pay | Admitting: Internal Medicine

## 2014-07-31 VITALS — BP 142/84 | Temp 97.8°F | Ht 64.0 in | Wt 279.0 lb

## 2014-07-31 DIAGNOSIS — M069 Rheumatoid arthritis, unspecified: Secondary | ICD-10-CM

## 2014-07-31 DIAGNOSIS — N63 Unspecified lump in breast: Secondary | ICD-10-CM

## 2014-07-31 DIAGNOSIS — R519 Headache, unspecified: Secondary | ICD-10-CM

## 2014-07-31 DIAGNOSIS — R51 Headache: Secondary | ICD-10-CM

## 2014-07-31 DIAGNOSIS — Z23 Encounter for immunization: Secondary | ICD-10-CM

## 2014-07-31 DIAGNOSIS — R220 Localized swelling, mass and lump, head: Secondary | ICD-10-CM

## 2014-07-31 DIAGNOSIS — N6320 Unspecified lump in the left breast, unspecified quadrant: Secondary | ICD-10-CM

## 2014-07-31 NOTE — Patient Instructions (Addendum)
Consider seeing  Headache and neurology  If     persistent or progressive headache  Let us know if we need to refer.   The area on head poss a cyst  May not be important but can get opinion for surgery as to need for removal.  The area on left breast seems like a simple skin cyst that may have been inflamed and not breast tissue. If recurring and increasing in size then recheck .  Mammogram at 39 unless there are other findings .   Use your flonase EVERY DAY  . Limit the afrin type nose sprays  To avoid  Dependence ( afrin nose)  Make appt for wellness visit    In 2016  Spring or after

## 2014-07-31 NOTE — Progress Notes (Signed)
Pre visit review using our clinic review tool, if applicable. No additional management support is needed unless otherwise documented below in the visit note.  Chief Complaint  Patient presents with  . Breast Problem    Pt found a lump in her breast two weeks ago.  Can no longer find the lump.    HPI: [name]  comes in today for SDA for  new problem evaluation. A couple of things.  He is under treatment for rheumatoid arthritis on immunosuppressants. She noted a tender lump under her left breast a few weeks ago now. Find it but it mated appointment to ,. No associated fever or discharge. She's never had a mammogram was told wasn't due until she was 89. She has had area of abscess mid left chest. This was skin related  She is also noted nontender soft lump on the right occiput area also noted by her hairdresser to get checked out wonders if it has anything to do with her headaches  Has had headaches off and on for years but it been quite well remote history of seeing a headache specialist. Some increased more recently some like migraines some from neck. No neurologic findings with this. ROS: See pertinent positives and negatives per HPI. No nfever dc neuro sx   Past Medical History  Diagnosis Date  . Hypertension   . Breast abscess of female     Recurrent  . Headache(784.0)   . Fibroids     w/bleeding  . Recurrent periodic urticaria   . Pill esophagitis 08/28/2012    amoxicillin  by hx  disc plan nl voice except hoarse not drooling  close fu  if not getting better with plan stop aleve  liquid ibu onlyf necessary   . Acute otitis media 08/28/2012    improved  change to liquid medication     No family history on file.  History   Social History  . Marital Status: Single    Spouse Name: N/A    Number of Children: N/A  . Years of Education: N/A   Social History Main Topics  . Smoking status: Never Smoker   . Smokeless tobacco: None  . Alcohol Use: Yes   Comment: occ  . Drug Use: None  . Sexual Activity: None   Other Topics Concern  . None   Social History Narrative    Outpatient Encounter Prescriptions as of 07/31/2014  Medication Sig  . celecoxib (CELEBREX) 200 MG capsule Take 200 mg by mouth 2 (two) times daily.  . Certolizumab Pegol (CIMZIA PREFILLED ) Inject 400 mg into the skin every 30 (thirty) days.  . Cholecalciferol (VITAMIN D3) 2000 UNITS TABS Take by mouth.  . cyanocobalamin 2000 MCG tablet Take 2,000 mcg by mouth daily.  [name] doxepin (SINEQUAN) 10 MG capsule Take 10 mg by mouth.  . fluticasone (FLONASE) 50 MCG/ACT nasal spray Place 2 sprays into the nose daily.  . hydroxychloroquine (PLAQUENIL) 200 MG tablet Take 200 mg by mouth 2 (two) times daily.  . IRON PO Take by mouth.  . labetalol (NORMODYNE) 100 MG tablet Take 100 mg by mouth once.  . Multiple Vitamin (MULTI-VITAMIN DAILY PO) Take by mouth.  . rizatriptan (MAXALT-MLT) 10 MG disintegrating tablet Take 1 tablet (10 mg total) by mouth as needed for migraine. May repeat in 2 hours if needed  . [DISCONTINUED] Elastic Bandages & Supports (MEDICAL COMPRESSION THIGH HIGH) MISC Use as needed for edema  . [DISCONTINUED] ibuprofen (ADVIL,MOTRIN) 800 MG tablet Take  800 mg by mouth 2 (two) times daily.    EXAM:  BP 142/84 mmHg  Temp(Src) 97.8 F (36.6 C) (Oral)  Ht 5\' 4"  (1.626 m)  Wt 279 lb (126.554 kg)  BMI 47.87 kg/m2  Body mass index is 47.87 kg/(m^2).  GENERAL: vitals reviewed and listed above, alert, oriented, appears well hydrated and in no acute distress HEENT: atraumatic, normocephalic there is a 0-9.4 cm soft round raised cystic feeling lesion on the right occiput without any skin changes.conjunctiva  clear, no obvious abnormalities on inspection of external nose and ears  NECK: no obvious masses on inspection palpation  Breast exam pendulous breasts no obvious discharge or dimpling old skin scarring from previous abscess and drainage left medial chest.  Lumpiness and breast but no specific nodule at the bra line on the left lateral there is a small thickening at the skin was some pigmentation in the central pore-like an old cyst but no breast mass CV: HRRR, no clubbing cyanosis or  nl cap refill  MS: moves all extremities without noticeable focal  abnormality PSYCH: pleasant and cooperative, no obvious depression or anxiety  ASSESSMENT AND PLAN:  Discussed the following assessment and plan:  Head lump - poss cyst ? if needs removal or observation not the cause of has  get surgery consult observe or remove? - Plan: Ambulatory referral to General Surgery  Recurrent headache - hx of same  Left breast lump? - not felt butthickening around what appears to be a skin cystreveiwed breast imaging guidelines  Rheumatoid arthritis  Need for prophylactic vaccination and inoculation against influenza - Plan: Flu Vaccine QUAD 36+ mos PF IM (Fluarix Quad PF)  -Patient advised to return or notify health care team  if symptoms worsen ,persist or new concerns arise.  Patient Instructions  Consider seeing  Headache and neurology  If     persistent or progressive headache  Let us know if we need to refer.   The area on head poss a cyst  May not be important but can get opinion for surgery as to need for removal.  The area on left breast seems like a simple skin cyst that may have been inflamed and not breast tissue. If recurring and increasing in size then recheck .  Mammogram at 39 unless there are other findings .   Use your flonase EVERY DAY  . Limit the afrin type nose sprays  To avoid  Dependence ( afrin nose)  Make appt for wellness visit    In 2016  Spring or after      Darly Massi K. Tuere Nwosu M.D.

## 2014-09-24 ENCOUNTER — Other Ambulatory Visit: Payer: Self-pay | Admitting: General Surgery

## 2014-12-16 ENCOUNTER — Telehealth: Payer: Self-pay | Admitting: Family Medicine

## 2014-12-16 NOTE — Telephone Encounter (Signed)
Received an office visit note for [facility].  Pt was seen on 12/10/14 and bp was elevated at 160/112.  Pt stated she had not taken her bp medication in two days because she was concerned about side effects of the medication.  She was told to start her medication as soon as possible and monitor her blood pressure closely by Dr. Estanislado Pandy.  I will contact the pt about her concerns of side effects.

## 2014-12-17 NOTE — Telephone Encounter (Signed)
Spoke to the pt.  She stated she had stopped the medication due to having bruising on both sides of her legs where she gets her injections.  She was not sure if the bruising was related to the bp medication.  She has re started her medication and states her last reading at [facility] was not high.

## 2015-02-08 ENCOUNTER — Other Ambulatory Visit (HOSPITAL_COMMUNITY): Payer: Self-pay | Admitting: Rheumatology

## 2015-02-08 ENCOUNTER — Ambulatory Visit (HOSPITAL_COMMUNITY)
Admission: RE | Admit: 2015-02-08 | Discharge: 2015-02-08 | Disposition: A | Discharge status: Home / Self Care | Payer: Commercial Managed Care - PPO | Source: Ambulatory Visit | Attending: Internal Medicine | Admitting: Internal Medicine

## 2015-02-08 DIAGNOSIS — R609 Edema, unspecified: Secondary | ICD-10-CM

## 2015-02-08 DIAGNOSIS — M79604 Pain in right leg: Secondary | ICD-10-CM | POA: Insufficient documentation

## 2015-02-08 NOTE — Progress Notes (Signed)
*  PRELIMINARY RESULTS* Vascular Ultrasound Right lower extremity venous duplex has been completed.  Preliminary findings: no evidence of DVT.  Called report to Dr. Estanislado Pandy.  Landry Mellow, RDMS, RVT  02/08/2015, 2:45 PM

## 2015-05-25 ENCOUNTER — Telehealth: Payer: Self-pay | Admitting: Internal Medicine

## 2015-05-25 ENCOUNTER — Encounter: Payer: Self-pay | Admitting: Internal Medicine

## 2015-05-25 ENCOUNTER — Ambulatory Visit (INDEPENDENT_AMBULATORY_CARE_PROVIDER_SITE_OTHER): Payer: Commercial Managed Care - PPO | Admitting: Internal Medicine

## 2015-05-25 VITALS — BP 140/86 | Temp 98.4°F | Wt 268.1 lb

## 2015-05-25 DIAGNOSIS — R51 Headache: Secondary | ICD-10-CM | POA: Diagnosis not present

## 2015-05-25 DIAGNOSIS — I10 Essential (primary) hypertension: Secondary | ICD-10-CM

## 2015-05-25 DIAGNOSIS — N979 Female infertility, unspecified: Secondary | ICD-10-CM

## 2015-05-25 DIAGNOSIS — M069 Rheumatoid arthritis, unspecified: Secondary | ICD-10-CM | POA: Diagnosis not present

## 2015-05-25 DIAGNOSIS — R519 Headache, unspecified: Secondary | ICD-10-CM

## 2015-05-25 MED ORDER — LABETALOL HCL 100 MG PO TABS: 100.0000 mg | ORAL_TABLET | Freq: Two times a day (BID) | ORAL | Status: DC

## 2015-05-25 MED ORDER — RIZATRIPTAN BENZOATE 10 MG PO TBDP: 10.0000 mg | ORAL_TABLET | ORAL | Status: DC | PRN

## 2015-05-25 NOTE — Telephone Encounter (Signed)
Patient Name: Jennifer [name]  DOB: 04/21/1976    Initial Comment caller is Santiago Glad with [facility] - states they have a pt of Dr Regis Bill - her bp 180/120   Nurse Assessment  Nurse: Julien Girt, RN, Almyra Free Date/Time Eilene Ghazi Time): 05/25/2015 1:02:43 PM  Confirm and document reason for call. If symptomatic, describe symptoms. ---Caller states this patient is in the office for an ultrasound, then appt with Dr. Arvella Nigh. Her b/p is 180/120 upon arrival and Dr. Radene Knee wanted her to call and get her in to see her PCP. The patient is present with caller. She is very stressed but denies headache, visual changes weakness or gait disturbance.  Has the patient traveled out of the country within the last 30 days? ---Not Applicable  Does the patient require triage? ---Yes  Related visit to physician within the last 2 weeks? ---No  Does the PT have any chronic conditions? (i.e. diabetes, asthma, etc.) ---Yes  List chronic conditions. ---Htn  Did the patient indicate they were pregnant? ---No     Guidelines    Guideline Title Affirmed Question Affirmed Notes  High Blood Pressure BP ? 180/110    Final Disposition User   See Physician within 24 Hours Julien Girt, RN, Almyra Free    Referrals  REFERRED TO PCP OFFICE   Disagree/Comply: Leta Baptist

## 2015-05-25 NOTE — Progress Notes (Signed)
Pre visit review using our clinic review tool, if applicable. No additional management support is needed unless otherwise documented below in the visit note.  Chief Complaint  Patient presents with  . Hypertension    HPI: [name]  comes in today for SDA for  problem evaluation. Here with her partner. Acutely  Sent in by team health  becauyse of high bp at the gynecology office visit.  Last visit was 2015  She has dx of RA   oa kness  Seen but dr D at [place] [facility]  She has a hx of ht   But hastn been seen  In quite a while was given labetalol when attempting pregnancy. 100 mg once a day. However really hasn't been taking it took it for a few weeks a few weeks ago may be couple times a week recently.  Was stressed in the office when she found out she had fibroids which would complicate the ability to get pregnant. At that point her blood pressure and went up high .   Took last medication few days. labetolol  A few weeks ago. Arthritis is stable .      celebrex once a day  And plaquenil.    ocass   red wine.     Sleep  Some  Not a loig lately.  Checked readings  Weeks ago  About 146/92    Has 3 large   Fibroids.     Asks for a refill of Maxalt works for her headaches. No refill for a while. ROS: See pertinent positives and negatives per HPI.  Past Medical History  Diagnosis Date  . Hypertension   . Breast abscess of female     Recurrent  . Headache(784.0)   . Fibroids     w/bleeding  . Recurrent periodic urticaria   . Pill esophagitis 08/28/2012    amoxicillin  by hx  disc plan nl voice except hoarse not drooling  close fu  if not getting better with plan stop aleve  liquid ibu onlyf necessary   . Acute otitis media 08/28/2012    improved  change to liquid medication     No family history on file.  Social History   Social History  . Marital Status: Single    Spouse Name: N/A  . Number of Children: N/A  . Years of Education: N/A   Social History Main  Topics  . Smoking status: Never Smoker   . Smokeless tobacco: Not on file  . Alcohol Use: Yes     Comment: occ  . Drug Use: Not on file  . Sexual Activity: Not on file   Other Topics Concern  . Not on file   Social History Narrative    Outpatient Prescriptions Prior to Visit  Medication Sig Dispense Refill  . celecoxib (CELEBREX) 200 MG capsule Take 200 mg by mouth 2 (two) times daily.    . Certolizumab Pegol (CIMZIA PREFILLED [place]) Inject 400 mg into the skin every 30 (thirty) days.    . Cholecalciferol (VITAMIN D3) 2000 UNITS TABS Take by mouth.    . cyanocobalamin 2000 MCG tablet Take 2,000 mcg by mouth daily.    [name] doxepin (SINEQUAN) 10 MG capsule Take 10 mg by mouth.    . fluticasone (FLONASE) 50 MCG/ACT nasal spray Place 2 sprays into the nose daily. 16 g 6  . hydroxychloroquine (PLAQUENIL) 200 MG tablet Take 200 mg by mouth 2 (two) times daily.    . IRON PO Take  by mouth.    . Multiple Vitamin (MULTI-VITAMIN DAILY PO) Take by mouth.    . labetalol (NORMODYNE) 100 MG tablet Take 100 mg by mouth once.    . rizatriptan (MAXALT-MLT) 10 MG disintegrating tablet Take 1 tablet (10 mg total) by mouth as needed for migraine. May repeat in 2 hours if needed 9 tablet 0   No facility-administered medications prior to visit.     EXAM:  BP 140/86 mmHg  Temp(Src) 98.4 F (36.9 C) (Oral)  Wt 268 lb 1.6 oz (121.609 kg)  Body mass index is 46 kg/(m^2).  GENERAL: vitals reviewed and listed above, alert, oriented, appears well hydrated and in no acute distress HEENT: atraumatic, conjunctiva  clear, no obvious abnormalities on inspection of external nose and ears NECK: no obvious masses on inspection palpation  LUNGS: clear to auscultation bilaterally, no wheezes, rales or rhonchi,  CV: HRRR, no clubbing cyanosis or  peripheral edema nl cap refill  MS: moves all extremities   ambulatory. PSYCH: pleasant and cooperative, no obvious depression or anxiety BP Readings from Last 3  Encounters:  05/25/15 140/86  07/31/14 142/84  12/25/13 136/96   Wt Readings from Last 3 Encounters:  05/25/15 268 lb 1.6 oz (121.609 kg)  07/31/14 279 lb (126.554 kg)  12/25/13 287 lb (130.182 kg)   Lab Results  Component Value Date   WBC 4.9 10/29/2009   HGB 14.3 01/12/2012   HCT 42.0 01/12/2012   PLT 322.0 10/29/2009   GLUCOSE 92 02/28/2013   ALT 14 10/29/2009   AST 20 10/29/2009   NA 135 02/28/2013   K 4.0 02/28/2013   CL 102 02/28/2013   CREATININE 0.88 02/28/2013   BUN 10 02/28/2013   CO2 25 02/28/2013     ASSESSMENT AND PLAN:  Discussed the following assessment and plan:  Essential hypertension - not taking med  take daily bid  disc risk benefot of rx  prob  stress response on top  of baseline  advise chem bmp none in ehr for a while  rx given for next l  Recurrent headache - refill med with caution  Female fertility problem - fibroids   see note  under care  Rheumatoid arthritis  disc risk benefot of  Her meds   ls and take meds  Regularly Total visit 85mins > 50% spent counseling and coordinating care as indicated in above note and in instructions to patient .   -Patient advised to return or notify health care team  if symptoms worsen ,persist or new concerns arise.  Patient Instructions  Would  Like you to have  Bmp   Chemistry   Next  Lab tests .   [name]  Take  medication on a regularbasis .   100 mg twice a day appt fu in    6-8 weeks   Can contact us with readings   If really controlled and then plan fu . DASH Eating Plan DASH stands for "Dietary Approaches to Stop Hypertension." The DASH eating plan is a healthy eating plan that has been shown to reduce high blood pressure (hypertension). Additional health benefits may include reducing the risk of type 2 diabetes mellitus, heart disease, and stroke. The DASH eating plan may also help with weight loss. WHAT DO I NEED TO KNOW ABOUT THE DASH EATING PLAN? For the DASH eating plan, you will follow these  general guidelines:  Choose foods with a percent daily value for sodium of less than 5% (as listed on the food label).  Use salt-free seasonings or herbs instead of table salt or sea salt.  Check with your health care provider or pharmacist before using salt substitutes.  Eat lower-sodium products, often labeled as "lower sodium" or "no salt added."  Eat fresh foods.  Eat more vegetables, fruits, and low-fat dairy products.  Choose whole grains. Look for the word "whole" as the first word in the ingredient list.  Choose fish and skinless chicken or [place] more often than red meat. Limit fish, poultry, and meat to 6 oz (170 g) each day.  Limit sweets, desserts, sugars, and sugary drinks.  Choose heart-healthy fats.  Limit cheese to 1 oz (28 g) per day.  Eat more home-cooked food and less restaurant, buffet, and fast food.  Limit fried foods.  Cook foods using methods other than frying.  Limit canned vegetables. If you do use them, rinse them well to decrease the sodium.  When eating at a restaurant, ask that your food be prepared with less salt, or no salt if possible. WHAT FOODS CAN I EAT? Seek help from a dietitian for individual calorie needs. Grains Whole grain or whole wheat bread. Brown rice. Whole grain or whole wheat pasta. Quinoa, bulgur, and whole grain cereals. Low-sodium cereals. Corn or whole wheat flour tortillas. Whole grain cornbread. Whole grain crackers. Low-sodium crackers. Vegetables Fresh or frozen vegetables (raw, steamed, roasted, or grilled). Low-sodium or reduced-sodium tomato and vegetable juices. Low-sodium or reduced-sodium tomato sauce and paste. Low-sodium or reduced-sodium canned vegetables.  Fruits All fresh, canned (in natural juice), or frozen fruits. Meat and Other Protein Products Ground beef (85% or leaner), grass-fed beef, or beef trimmed of fat. Skinless chicken or [place]. Ground chicken or [place]. Pork trimmed of fat. All fish and  seafood. Eggs. Dried beans, peas, or lentils. Unsalted nuts and seeds. Unsalted canned beans. Dairy Low-fat dairy products, such as skim or 1% milk, 2% or reduced-fat cheeses, low-fat ricotta or cottage cheese, or plain low-fat yogurt. Low-sodium or reduced-sodium cheeses. Fats and Oils Tub margarines without trans fats. Light or reduced-fat mayonnaise and salad dressings (reduced sodium). Avocado. Safflower, olive, or canola oils. Natural peanut or almond butter. Other Unsalted popcorn and pretzels. The items listed above may not be a complete list of recommended foods or beverages. Contact your dietitian for more options. WHAT FOODS ARE NOT RECOMMENDED? Grains White bread. White pasta. White rice. Refined cornbread. Bagels and croissants. Crackers that contain trans fat. Vegetables Creamed or fried vegetables. Vegetables in a cheese sauce. Regular canned vegetables. Regular canned tomato sauce and paste. Regular tomato and vegetable juices. Fruits Dried fruits. Canned fruit in light or heavy syrup. Fruit juice. Meat and Other Protein Products Fatty cuts of meat. Ribs, chicken wings, bacon, sausage, bologna, salami, chitterlings, fatback, hot dogs, bratwurst, and packaged luncheon meats. Salted nuts and seeds. Canned beans with salt. Dairy Whole or 2% milk, cream, half-and-half, and cream cheese. Whole-fat or sweetened yogurt. Full-fat cheeses or blue cheese. Nondairy creamers and whipped toppings. Processed cheese, cheese spreads, or cheese curds. Condiments Onion and garlic salt, seasoned salt, table salt, and sea salt. Canned and packaged gravies. Worcestershire sauce. Tartar sauce. Barbecue sauce. Teriyaki sauce. Soy sauce, including reduced sodium. Steak sauce. Fish sauce. Oyster sauce. Cocktail sauce. Horseradish. Ketchup and mustard. Meat flavorings and tenderizers. Bouillon cubes. Hot sauce. Tabasco sauce. Marinades. Taco seasonings. Relishes. Fats and Oils Butter, stick margarine,  lard, shortening, ghee, and bacon fat. Coconut, palm kernel, or palm oils. Regular salad dressings. Other Pickles and olives. Salted popcorn and  pretzels. The items listed above may not be a complete list of foods and beverages to avoid. Contact your dietitian for more information. WHERE CAN I FIND MORE INFORMATION? National Heart, Lung, and Blood Institute: travelstabloid.com Document Released: 08/24/2011 Document Revised: 01/19/2014 Document Reviewed: 07/09/2013 [facility] Patient Information 2015 [place], [place]. This information is not intended to replace advice given to you by your health care provider. Make sure you discuss any questions you have with your health care provider.      Standley Brooking. Panosh M.D.

## 2015-05-25 NOTE — Patient Instructions (Addendum)
Would  Like you to have  Bmp   Chemistry   Next  Lab tests .   [name]  Take  medication on a regularbasis .   100 mg twice a day appt fu in    6-8 weeks   Can contact us with readings   If really controlled and then plan fu . DASH Eating Plan DASH stands for "Dietary Approaches to Stop Hypertension." The DASH eating plan is a healthy eating plan that has been shown to reduce high blood pressure (hypertension). Additional health benefits may include reducing the risk of type 2 diabetes mellitus, heart disease, and stroke. The DASH eating plan may also help with weight loss. WHAT DO I NEED TO KNOW ABOUT THE DASH EATING PLAN? For the DASH eating plan, you will follow these general guidelines:  Choose foods with a percent daily value for sodium of less than 5% (as listed on the food label).  Use salt-free seasonings or herbs instead of table salt or sea salt.  Check with your health care provider or pharmacist before using salt substitutes.  Eat lower-sodium products, often labeled as "lower sodium" or "no salt added."  Eat fresh foods.  Eat more vegetables, fruits, and low-fat dairy products.  Choose whole grains. Look for the word "whole" as the first word in the ingredient list.  Choose fish and skinless chicken or [place] more often than red meat. Limit fish, poultry, and meat to 6 oz (170 g) each day.  Limit sweets, desserts, sugars, and sugary drinks.  Choose heart-healthy fats.  Limit cheese to 1 oz (28 g) per day.  Eat more home-cooked food and less restaurant, buffet, and fast food.  Limit fried foods.  Cook foods using methods other than frying.  Limit canned vegetables. If you do use them, rinse them well to decrease the sodium.  When eating at a restaurant, ask that your food be prepared with less salt, or no salt if possible. WHAT FOODS CAN I EAT? Seek help from a dietitian for individual calorie needs. Grains Whole grain or whole wheat bread. Brown rice. Whole grain or  whole wheat pasta. Quinoa, bulgur, and whole grain cereals. Low-sodium cereals. Corn or whole wheat flour tortillas. Whole grain cornbread. Whole grain crackers. Low-sodium crackers. Vegetables Fresh or frozen vegetables (raw, steamed, roasted, or grilled). Low-sodium or reduced-sodium tomato and vegetable juices. Low-sodium or reduced-sodium tomato sauce and paste. Low-sodium or reduced-sodium canned vegetables.  Fruits All fresh, canned (in natural juice), or frozen fruits. Meat and Other Protein Products Ground beef (85% or leaner), grass-fed beef, or beef trimmed of fat. Skinless chicken or [place]. Ground chicken or [place]. Pork trimmed of fat. All fish and seafood. Eggs. Dried beans, peas, or lentils. Unsalted nuts and seeds. Unsalted canned beans. Dairy Low-fat dairy products, such as skim or 1% milk, 2% or reduced-fat cheeses, low-fat ricotta or cottage cheese, or plain low-fat yogurt. Low-sodium or reduced-sodium cheeses. Fats and Oils Tub margarines without trans fats. Light or reduced-fat mayonnaise and salad dressings (reduced sodium). Avocado. Safflower, olive, or canola oils. Natural peanut or almond butter. Other Unsalted popcorn and pretzels. The items listed above may not be a complete list of recommended foods or beverages. Contact your dietitian for more options. WHAT FOODS ARE NOT RECOMMENDED? Grains White bread. White pasta. White rice. Refined cornbread. Bagels and croissants. Crackers that contain trans fat. Vegetables Creamed or fried vegetables. Vegetables in a cheese sauce. Regular canned vegetables. Regular canned tomato sauce and paste. Regular tomato and vegetable  juices. Fruits Dried fruits. Canned fruit in light or heavy syrup. Fruit juice. Meat and Other Protein Products Fatty cuts of meat. Ribs, chicken wings, bacon, sausage, bologna, salami, chitterlings, fatback, hot dogs, bratwurst, and packaged luncheon meats. Salted nuts and seeds. Canned beans with  salt. Dairy Whole or 2% milk, cream, half-and-half, and cream cheese. Whole-fat or sweetened yogurt. Full-fat cheeses or blue cheese. Nondairy creamers and whipped toppings. Processed cheese, cheese spreads, or cheese curds. Condiments Onion and garlic salt, seasoned salt, table salt, and sea salt. Canned and packaged gravies. Worcestershire sauce. Tartar sauce. Barbecue sauce. Teriyaki sauce. Soy sauce, including reduced sodium. Steak sauce. Fish sauce. Oyster sauce. Cocktail sauce. Horseradish. Ketchup and mustard. Meat flavorings and tenderizers. Bouillon cubes. Hot sauce. Tabasco sauce. Marinades. Taco seasonings. Relishes. Fats and Oils Butter, stick margarine, lard, shortening, ghee, and bacon fat. Coconut, palm kernel, or palm oils. Regular salad dressings. Other Pickles and olives. Salted popcorn and pretzels. The items listed above may not be a complete list of foods and beverages to avoid. Contact your dietitian for more information. WHERE CAN I FIND MORE INFORMATION? National Heart, Lung, and Blood Institute: travelstabloid.com Document Released: 08/24/2011 Document Revised: 01/19/2014 Document Reviewed: 07/09/2013 [facility] Patient Information 2015 [place], [place]. This information is not intended to replace advice given to you by your health care provider. Make sure you discuss any questions you have with your health care provider.

## 2015-05-25 NOTE — Telephone Encounter (Signed)
Pt seen in the office today.  

## 2015-05-28 ENCOUNTER — Other Ambulatory Visit: Payer: Self-pay | Admitting: Obstetrics and Gynecology

## 2015-05-28 DIAGNOSIS — D259 Leiomyoma of uterus, unspecified: Secondary | ICD-10-CM

## 2015-06-15 ENCOUNTER — Ambulatory Visit
Admission: RE | Admit: 2015-06-15 | Discharge: 2015-06-15 | Disposition: A | Discharge status: Home / Self Care | Payer: Commercial Managed Care - PPO | Source: Ambulatory Visit | Attending: Obstetrics and Gynecology | Admitting: Obstetrics and Gynecology

## 2015-06-15 ENCOUNTER — Other Ambulatory Visit: Payer: Self-pay | Admitting: Obstetrics and Gynecology

## 2015-06-15 ENCOUNTER — Other Ambulatory Visit: Payer: Self-pay | Admitting: Radiology

## 2015-06-15 DIAGNOSIS — D259 Leiomyoma of uterus, unspecified: Secondary | ICD-10-CM

## 2015-06-15 NOTE — Consult Note (Signed)
Chief Complaint: Patient was seen in consultation today for  Chief Complaint  Patient presents with  . Advice Only    Consult for [place]     at the request of [place]  Referring Physician(s): McComb,John  History of Present Illness: Jennifer [name] is a 39 y.o. female G1 P0 A1, with a history of symptomatic uterine fibroids. She also has abnormal menstrual bleeding, secondary infertility and anemia. She presents to discuss uterine fibroid embolization. Review of her menstrual cycle was performed. Last muscle cycle 06/13/2015. Menstrual cycle is regular approximately every month. Cycle Last for 5-12 days with passage of heavy flow for 5 days as well as blood clots. No interperiod bleeding. She has to use both pads and tampons for the first week with a frequency of change every 3 hours. Fibroid diagnosis was approximately 10 years ago. In addition to the heavy menstrual flow she also has secondary infertility and associated anemia. Bulk related symptoms including urinary frequency, nocturia, pelvic pressure and pain, and urgency. She currently is not on any birth control pills or hormone replacement therapies. No fibroid surgeries in the past. Only rare yeast infection.  Last Pap smear 10/28/2014 was negative. Office ultrasound 05/25/2015 demonstrates several intramural fibroids measuring up to 3 cm. Uterine size is 16 x 10 x 12 cm by ultrasound.  Past Medical History  Diagnosis Date  . Hypertension   . Breast abscess of female     Recurrent  . Headache(784.0)   . Fibroids     w/bleeding  . Recurrent periodic urticaria   . Pill esophagitis 08/28/2012    amoxicillin  by hx  disc plan nl voice except hoarse not drooling  close fu  if not getting better with plan stop aleve  liquid ibu onlyf necessary   . Acute otitis media 08/28/2012    improved  change to liquid medication     Past Surgical History  Procedure Laterality Date  . Breast abscess  2003    removal     Allergies: Review of patient's allergies indicates no known allergies.  Medications: Prior to Admission medications   Medication Sig Start Date End Date Taking? Authorizing Provider  celecoxib (CELEBREX) 200 MG capsule Take 200 mg by mouth 2 (two) times daily.   Yes Bo Merino, MD  Certolizumab Pegol (CIMZIA PREFILLED ) Inject 400 mg into the skin every 30 (thirty) days.   Yes Bo Merino, MD  cyanocobalamin 2000 MCG tablet Take 2,000 mcg by mouth daily.   Yes Historical Provider, MD  doxepin (SINEQUAN) 10 MG capsule Take 10 mg by mouth.   Yes Historical Provider, MD  fluticasone (FLONASE) 50 MCG/ACT nasal spray Place 2 sprays into the nose daily. 08/21/12  Yes Lucretia Kern, DO  hydroxychloroquine (PLAQUENIL) 200 MG tablet Take 200 mg by mouth 2 (two) times daily.   Yes Bo Merino, MD  IRON PO Take by mouth.   Yes Historical Provider, MD  labetalol (NORMODYNE) 100 MG tablet Take 1 tablet (100 mg total) by mouth 2 (two) times daily. 05/25/15  Yes Burnis Medin, MD  Multiple Vitamin (MULTI-VITAMIN DAILY PO) Take by mouth.   Yes Historical Provider, MD  oxybutynin (DITROPAN-XL) 10 MG 24 hr tablet Take 10 mg by mouth daily. 02/24/15  Yes Historical Provider, MD  rizatriptan (MAXALT-MLT) 10 MG disintegrating tablet Take 1 tablet (10 mg total) by mouth as needed for migraine. May repeat in 2 hours if needed 05/25/15  Yes Burnis Medin,  MD  Cholecalciferol (VITAMIN D3) 2000 UNITS TABS Take by mouth.    Historical Provider, MD     No family history on file.  Social History   Social History  . Marital Status: Single    Spouse Name: N/A  . Number of Children: N/A  . Years of Education: N/A   Social History Main Topics  . Smoking status: Never Smoker   . Smokeless tobacco: Not on file  . Alcohol Use: Yes     Comment: occ  . Drug Use: Not on file  . Sexual Activity: Not on file   Other Topics Concern  . Not on file   Social History Narrative    Review of Systems: A  12 point ROS discussed and pertinent positives are indicated in the HPI above.  All other systems are negative.  Review of Systems  Constitutional: Positive for fatigue. Negative for chills, diaphoresis, activity change, appetite change and unexpected weight change.  Respiratory: Negative for cough, chest tightness and shortness of breath.   Cardiovascular: Negative for chest pain.  Gastrointestinal: Negative for nausea, vomiting, diarrhea, constipation and abdominal distention.  Genitourinary: Positive for urgency and vaginal bleeding. Negative for dysuria.    Vital Signs: Ht 5\' 4"  (1.626 m)  Wt 270 lb (122.471 kg)  BMI 46.32 kg/m2  Physical Exam  Constitutional: She appears well-developed and well-nourished. No distress.  Obese African-American female appearing her stated age.  Cardiovascular: Normal rate, regular rhythm and normal heart sounds.  Exam reveals no friction rub.   No murmur heard. Pulmonary/Chest: Effort normal and breath sounds normal. No respiratory distress. She has no wheezes. She has no rales.  Abdominal: Soft. Bowel sounds are normal. She exhibits no distension. There is no tenderness. There is no rebound and no guarding. No hernia.  Firm nontender fibroid uterus is palpable in the lower abdomen below the umbilicus. Estimated size 12-14 weeks.  Skin: She is not diaphoretic.    Imaging: None available.  Labs:  None available.  Assessment and Plan:  39 year old female with symptomatic uterine fibroids resulting in abnormal menstrual bleeding, pelvic pain and pressure, urinary tract symptoms, as well as secondary infertility and anemia. She has been trying to get pregnant for approximate 2-1/2 years. She still wishes to become pregnant. She is accompanied by her significant other today. We had a lengthy discussion regarding uterine fibroid embolization treatment. The procedure, risks, benefits, alternatives, expected goals and outcomes were reviewed. This  procedure is usually not recommended if the patient still wishes to become pregnant. However, there have been reports of full-term pregnancies following uterine fibroid embolization.  For further workup, I would recommend obtaining a pelvic MRI without and with contrast to determine if she is a candidate for fibroid embolization.  Additionally, she would like to pursue an infertility evaluation. We also discussed obtaining a hysterosalpingogram. After our discussion, she would like an initial infertility evaluation and consider HSG to evaluate for tubal patency. We plan to refer her to Dr. Carren Rang.  I will see her back following the pelvic MRI and the infertility evaluation.  Thank you for this interesting consult.  I greatly enjoyed meeting Jennifer [name] and look forward to participating in their care.  A copy of this report was sent to the requesting provider on this date.  SignedGreggory Keen 06/15/2015, 10:28 AM   I spent a total of  40 Minutes   in face to face in clinical consultation, greater than 50% of which was counseling/coordinating care for this  patient with symptomatic uterine fibroids, heavy menstrual bleeding, and secondary infertility as well as anemia.

## 2015-06-29 ENCOUNTER — Ambulatory Visit
Admission: RE | Admit: 2015-06-29 | Discharge: 2015-06-29 | Disposition: A | Discharge status: Home / Self Care | Payer: Commercial Managed Care - PPO | Source: Ambulatory Visit | Attending: Obstetrics and Gynecology | Admitting: Obstetrics and Gynecology

## 2015-06-29 DIAGNOSIS — D259 Leiomyoma of uterus, unspecified: Secondary | ICD-10-CM

## 2015-06-29 MED ORDER — GADOBENATE DIMEGLUMINE 529 MG/ML IV SOLN
20.0000 mL | Freq: Once | INTRAVENOUS | Status: AC | PRN
  Administered 2015-06-29: 20 mL via INTRAVENOUS

## 2015-08-13 ENCOUNTER — Other Ambulatory Visit: Payer: Self-pay | Admitting: Internal Medicine

## 2015-08-17 NOTE — Telephone Encounter (Signed)
Ok x 1

## 2015-08-17 NOTE — Telephone Encounter (Signed)
Sent to the pharmacy by e-scribe. 

## 2015-10-04 ENCOUNTER — Other Ambulatory Visit: Payer: Self-pay | Admitting: Internal Medicine

## 2015-10-04 NOTE — Telephone Encounter (Signed)
Did not return for bp follow up

## 2015-10-07 ENCOUNTER — Telehealth: Payer: Self-pay | Admitting: Family Medicine

## 2015-10-07 NOTE — Telephone Encounter (Signed)
Pt needs a follow up with [facility] for blood pressure.  Please help the pt to make an appointment.

## 2015-10-07 NOTE — Telephone Encounter (Signed)
Sent to the pharmacy by e-scribe.  Message sent to scheduling. 

## 2015-10-07 NOTE — Telephone Encounter (Signed)
Schedule routine ROV for bp follow up. Refill med x 1

## 2015-10-07 NOTE — Telephone Encounter (Signed)
Pt has been sch

## 2015-10-25 ENCOUNTER — Encounter: Payer: Commercial Managed Care - PPO | Admitting: Internal Medicine

## 2015-10-25 ENCOUNTER — Telehealth: Payer: Self-pay | Admitting: Family Medicine

## 2015-10-25 NOTE — Progress Notes (Signed)
Document opened and reviewed for OV visit . No showed .   

## 2015-10-25 NOTE — Telephone Encounter (Signed)
Pt missed appt with [facility] this morning at 8:30 AM.  Left a message on home/cell informing the pt to call back and re schedule appt.

## 2015-11-01 ENCOUNTER — Other Ambulatory Visit: Payer: Self-pay | Admitting: Internal Medicine

## 2015-11-02 ENCOUNTER — Telehealth: Payer: Self-pay | Admitting: Family Medicine

## 2015-11-02 NOTE — Telephone Encounter (Signed)
She missed her last appt   Last week  Can refill for 1 month worth and needs  Ov

## 2015-11-02 NOTE — Telephone Encounter (Signed)
Pt needs to be seen for follow up within the next 30 days.  Please help her to make this appointment.

## 2015-11-02 NOTE — Telephone Encounter (Signed)
Sent to the pharmacy by e-scribe.  Message sent to scheduling. 

## 2015-11-02 NOTE — Telephone Encounter (Signed)
Pt has been sch

## 2015-11-15 ENCOUNTER — Ambulatory Visit (INDEPENDENT_AMBULATORY_CARE_PROVIDER_SITE_OTHER): Payer: Commercial Managed Care - PPO | Admitting: Internal Medicine

## 2015-11-15 ENCOUNTER — Encounter: Payer: Self-pay | Admitting: Internal Medicine

## 2015-11-15 VITALS — BP 142/92 | Temp 97.1°F | Wt 261.5 lb

## 2015-11-15 DIAGNOSIS — M069 Rheumatoid arthritis, unspecified: Secondary | ICD-10-CM | POA: Diagnosis not present

## 2015-11-15 DIAGNOSIS — R51 Headache: Secondary | ICD-10-CM | POA: Diagnosis not present

## 2015-11-15 DIAGNOSIS — Z79899 Other long term (current) drug therapy: Secondary | ICD-10-CM

## 2015-11-15 DIAGNOSIS — R519 Headache, unspecified: Secondary | ICD-10-CM

## 2015-11-15 DIAGNOSIS — I10 Essential (primary) hypertension: Secondary | ICD-10-CM

## 2015-11-15 MED ORDER — LABETALOL HCL 100 MG PO TABS: 200.0000 mg | ORAL_TABLET | Freq: Two times a day (BID) | ORAL | Status: DC

## 2015-11-15 NOTE — Patient Instructions (Signed)
Increase labetolol  To 200 twice  A day   Increase slowly  BP is almost at goal.   Calendar you headache .  Phone app is ok.    Be aware of triggers .   ROV.  in 3 month OR  Send  In readings   Goal is below  140 /90.Marland KitchenMarland Kitchen

## 2015-11-15 NOTE — Progress Notes (Signed)
Pre visit review using our clinic review tool, if applicable. No additional management support is needed unless otherwise documented below in the visit note.  Chief Complaint  Patient presents with  . Follow-up    Would like to change rizatriptan benzoate (Maxalt-MLT) to name brand product.  Generic does not work as well.    HPI: [name]  comes in today for  Bp management evaluation. See  Sept ov preg attempting  No longfer but still on  100 bid of labetolol   Bp reported  goes up with  Stress and pain .  Not checking at home .  142/90  Last check .  Lots of stress today   .   MHAs  Not responding to maxalt sot took excedrin  .  2-3 coffe in am  Not at night   imitrex and maxalt used to work for has   Had seen dr Luiz Ochoa in the past. Arthritis  Stable  Had to have hand injections  Some help.   Gets l;abs every 3 month s mayb e due soon   Dec celebrex to as needed   And dec plaquenil  ROS: See pertinent positives and negatives per HPI. Had some dizzy spells  recently  Past Medical History  Diagnosis Date  . Hypertension   . Breast abscess of female     Recurrent  . Headache(784.0)   . Fibroids     w/bleeding  . Recurrent periodic urticaria   . Pill esophagitis 08/28/2012    amoxicillin  by hx  disc plan nl voice except hoarse not drooling  close fu  if not getting better with plan stop aleve  liquid ibu onlyf necessary   . Acute otitis media 08/28/2012    improved  change to liquid medication     No family history on file.  Social History   Social History  . Marital Status: Single    Spouse Name: N/A  . Number of Children: N/A  . Years of Education: N/A   Social History Main Topics  . Smoking status: Never Smoker   . Smokeless tobacco: None  . Alcohol Use: Yes     Comment: occ  . Drug Use: None  . Sexual Activity: Not Asked   Other Topics Concern  . None   Social History Narrative    Outpatient Prescriptions Prior to Visit  Medication Sig  Dispense Refill  . celecoxib (CELEBREX) 200 MG capsule Take 200 mg by mouth 2 (two) times daily.    . Certolizumab Pegol (CIMZIA PREFILLED [place]) Inject 400 mg into the skin every 30 (thirty) days.    . Cholecalciferol (VITAMIN D3) 2000 UNITS TABS Take by mouth.    . doxepin (SINEQUAN) 10 MG capsule Take 10 mg by mouth.    . fluticasone (FLONASE) 50 MCG/ACT nasal spray Place 2 sprays into the nose daily. 16 g 6  . hydroxychloroquine (PLAQUENIL) 200 MG tablet Take 200 mg by mouth 2 (two) times daily.    . IRON PO Take by mouth.    . Multiple Vitamin (MULTI-VITAMIN DAILY PO) Take by mouth.    . oxybutynin (DITROPAN-XL) 10 MG 24 hr tablet Take 10 mg by mouth daily.  11  . rizatriptan (MAXALT-MLT) 10 MG disintegrating tablet TAKE 1 TABLET (10 MG TOTAL) BY MOUTH AS NEEDED FOR MIGRAINE. MAY REPEAT IN 2 HOURS IF NEEDED 9 tablet 0  . labetalol (NORMODYNE) 100 MG tablet TAKE 1 TABLET (100 MG TOTAL) BY MOUTH 2 (TWO) TIMES  DAILY. 60 tablet 0  . cyanocobalamin 2000 MCG tablet Take 2,000 mcg by mouth daily.     No facility-administered medications prior to visit.     EXAM:  BP 142/92 mmHg  Temp(Src) 97.1 F (36.2 C) (Oral)  Wt 261 lb 8 oz (118.616 kg)  Body mass index is 44.86 kg/(m^2).  bp rechecked large and thigh cuff   GENERAL: vitals reviewed and listed above, alert, oriented, appears well hydrated and in no acute distress HEENT: atraumatic, conjunctiva  clear, no obvious abnormalities on inspection of external nose and ears NECK: no obvious masses on inspection palpation  LUNGS: clear to auscultation bilaterally, no wheezes, rales or rhonchi, good air movement CV: HRRR, no clubbing cyanosis or  peripheral edema nl cap refill  MS: moves all extremities without noticeable focal  abnormality PSYCH: pleasant and cooperative, no obvious depression or anxiety Lab Results  Component Value Date   WBC 4.9 10/29/2009   HGB 14.3 01/12/2012   HCT 42.0 01/12/2012   PLT 322.0 10/29/2009   GLUCOSE 92  02/28/2013   ALT 14 10/29/2009   AST 20 10/29/2009   NA 135 02/28/2013   K 4.0 02/28/2013   CL 102 02/28/2013   CREATININE 0.88 02/28/2013   BUN 10 02/28/2013   CO2 25 02/28/2013   BP Readings from Last 3 Encounters:  11/15/15 142/92  05/25/15 140/86  07/31/14 142/84   Wt Readings from Last 3 Encounters:  11/15/15 261 lb 8 oz (118.616 kg)  06/29/15 270 lb (122.471 kg)  06/15/15 270 lb (122.471 kg)    ASSESSMENT AND PLAN:  Discussed the following assessment and plan:  Essential hypertension - inc labetolol to 200 bid with caution   send in readings or ov in 3 months  no loinger planning conceptions at this time  Recurrent headache - maxalt not working to took excedrin  2-3 caffiene in am  track and get back consider controller med  if ongoing   Rheumatoid arthritis involving multiple sites, unspecified rheumatoid factor presence ([place]) - poss ccp aby May need to see ha specialist again calendar  And  Plan fu    Reviewed  common triggers Total visit 62mins > 50% spent counseling and coordinating care as indicated in above note and in instructions to patient .  -Patient advised to return or notify health care team  if symptoms worsen ,persist or new concerns arise. Didn't have time to do cbc abd cmpt today  Patient Instructions  Increase labetolol  To 200 twice  A day   Increase slowly  BP is almost at goal.   Calendar you headache .  Phone app is ok.    Be aware of triggers .   ROV.  in 3 month OR  Send  In readings   Goal is below  140 /90...      Standley Brooking. [place] M.D.

## 2016-01-15 ENCOUNTER — Other Ambulatory Visit: Payer: Self-pay | Admitting: Internal Medicine

## 2016-01-17 ENCOUNTER — Telehealth: Payer: Self-pay | Admitting: Family Medicine

## 2016-01-17 NOTE — Telephone Encounter (Signed)
Pt due for follow up the end of May.  Please contact her and help her schedule appt.  Thanks!

## 2016-01-17 NOTE — Telephone Encounter (Signed)
#  9 Sent to the pharmacy.  Pt due for follow up the end of May.  Message sent to scheduling.

## 2016-01-18 NOTE — Telephone Encounter (Signed)
I agree needs OV  Can do end of day  Of needed

## 2016-01-19 NOTE — Telephone Encounter (Signed)
Pt has been sch

## 2016-02-02 ENCOUNTER — Encounter: Payer: Self-pay | Admitting: Internal Medicine

## 2016-02-02 ENCOUNTER — Ambulatory Visit (INDEPENDENT_AMBULATORY_CARE_PROVIDER_SITE_OTHER): Payer: Commercial Managed Care - PPO | Admitting: Internal Medicine

## 2016-02-02 VITALS — BP 142/100 | HR 68 | Temp 98.1°F | Ht 64.0 in | Wt 256.0 lb

## 2016-02-02 DIAGNOSIS — Z79899 Other long term (current) drug therapy: Secondary | ICD-10-CM

## 2016-02-02 DIAGNOSIS — M069 Rheumatoid arthritis, unspecified: Secondary | ICD-10-CM | POA: Diagnosis not present

## 2016-02-02 DIAGNOSIS — J111 Influenza due to unidentified influenza virus with other respiratory manifestations: Secondary | ICD-10-CM | POA: Diagnosis not present

## 2016-02-02 DIAGNOSIS — R070 Pain in throat: Secondary | ICD-10-CM

## 2016-02-02 DIAGNOSIS — R6889 Other general symptoms and signs: Secondary | ICD-10-CM | POA: Diagnosis not present

## 2016-02-02 LAB — POCT INFLUENZA A/B
Influenza A, POC: POSITIVE — AB
Influenza B, POC: POSITIVE — AB

## 2016-02-02 LAB — POCT RAPID STREP A (OFFICE): Rapid Strep A Screen: NEGATIVE

## 2016-02-02 MED ORDER — OSELTAMIVIR PHOSPHATE 75 MG PO CAPS: 75.0000 mg | ORAL_CAPSULE | Freq: Two times a day (BID) | ORAL | Status: DC

## 2016-02-02 NOTE — Progress Notes (Signed)
Chief Complaint  Patient presents with  . Headache  . Sore Throat  . Chills  . Muscle Pain  . Fever    102 last night    HPI: Jennifer [name] 40 y.o.  [name]  comes in today for SDA for  new problem evaluation.  Onset  Yesterday sneezing then cough and  Then end of night aches and chills and sweats.   Temp 102  Took  24 hours  Allergy med  At onset no fever med.  Last inj may 5th  Cough is dry  Throat hurts around  Mid throat   No sob som chest soreness upper mid  No pleurisy pain  ROS: See pertinent positives and negatives per HPI.  Past Medical History  Diagnosis Date  . Hypertension   . Breast abscess of female     Recurrent  . Headache(784.0)   . Fibroids     w/bleeding  . Recurrent periodic urticaria   . Pill esophagitis 08/28/2012    amoxicillin  by hx  disc plan nl voice except hoarse not drooling  close fu  if not getting better with plan stop aleve  liquid ibu onlyf necessary   . Acute otitis media 08/28/2012    improved  change to liquid medication   . Migraines     Dr. Orie Rout    History reviewed. No pertinent family history.  Social History   Social History  . Marital Status: Single    Spouse Name: N/A  . Number of Children: N/A  . Years of Education: N/A   Social History Main Topics  . Smoking status: Never Smoker   . Smokeless tobacco: None  . Alcohol Use: Yes     Comment: occ  . Drug Use: No  . Sexual Activity: Not Asked   Other Topics Concern  . None   Social History Narrative    Outpatient Prescriptions Prior to Visit  Medication Sig Dispense Refill  . celecoxib (CELEBREX) 200 MG capsule Take 200 mg by mouth 2 (two) times daily.    . Certolizumab Pegol (CIMZIA PREFILLED [place]) Inject 400 mg into the skin every 30 (thirty) days.    . Cholecalciferol (VITAMIN D3) 2000 UNITS TABS Take by mouth.    . doxepin (SINEQUAN) 10 MG capsule Take 10 mg by mouth.    . fluticasone (FLONASE) 50 MCG/ACT nasal spray Place 2  sprays into the nose daily. 16 g 6  . IRON PO Take by mouth.    . labetalol (NORMODYNE) 100 MG tablet Take 2 tablets (200 mg total) by mouth 2 (two) times daily. 120 tablet 3  . Multiple Vitamin (MULTI-VITAMIN DAILY PO) Take by mouth.    . hydroxychloroquine (PLAQUENIL) 200 MG tablet Take 200 mg by mouth 2 (two) times daily.    [name] oxybutynin (DITROPAN-XL) 10 MG 24 hr tablet Take 10 mg by mouth daily.  11  . rizatriptan (MAXALT-MLT) 10 MG disintegrating tablet TAKE 1 TABLET (10 MG TOTAL) BY MOUTH AS NEEDED FOR MIGRAINE. MAY REPEAT IN 2 HOURS IF NEEDED 9 tablet 0   No facility-administered medications prior to visit.     EXAM:  BP 142/100 mmHg  Pulse 68  Temp(Src) 98.1 F (36.7 C) (Oral)  Ht 5\' 4"  (1.626 m)  Wt 256 lb (116.121 kg)  BMI 43.92 kg/m2  SpO2 96%  LMP 01/07/2016 (Exact Date)  Body mass index is 43.92 kg/(m^2). WDWN in NAD  quiet respirations; mildly congested  somewhat hoarse. Non  toxic .but ill HEENT: Normocephalic ;atraumatic , Eyes;  PERRL, EOMs  Full, lids and conjunctiva clear,,Ears: no deformities, canals nl, TM landmarks normal, Nose: no deformity or discharge but verycongested;face non tender Mouth : OP clear without lesion or edema . Neck: Supple without adenopathy or masses or bruits Chest:  Clear to A without wheezes rales or rhonchi CV:  S1-S2 no gallops or murmurs peripheral perfusion is normal Skin :nl perfusion and no acute rashes    BP Readings from Last 3 Encounters:  02/02/16 142/100  11/15/15 142/92  05/25/15 140/86   Wt Readings from Last 3 Encounters:  02/02/16 256 lb (116.121 kg)  11/15/15 261 lb 8 oz (118.616 kg)  06/29/15 270 lb (122.471 kg)     Flu pos rs neg  ASSESSMENT AND PLAN:  Discussed the following assessment and plan:  Influenza with respiratory manifestation  Flu-like symptoms - Plan: POCT Influenza A/B  Throat pain - Plan: POC Rapid Strep A  Rheumatoid arthritis involving multiple sites, unspecified rheumatoid factor  presence (HCC)  High risk medication use Flu  illness at risk  Last inj may 5th    Low threshold to check x ray other eval if not improved    Expectant management.  -Patient advised to return or notify health care team  if symptoms worsen ,persist or new concerns arise.  Patient Instructions  This acts like flu  Confirmed .  Will begin tamiflu   To reduce the lenth of the fever. You are at risk for pneumonia  But exam seems normal today .  fif fever not gone in the next 72 hours contact us for recheck or advice      Influenza, Adult Influenza ("the flu") is a viral infection of the respiratory tract. It occurs more often in winter months because people spend more time in close contact with one another. Influenza can make you feel very sick. Influenza easily spreads from person to person (contagious). CAUSES  Influenza is caused by a virus that infects the respiratory tract. You can catch the virus by breathing in droplets from an infected person's cough or sneeze. You can also catch the virus by touching something that was recently contaminated with the virus and then touching your mouth, nose, or eyes. RISKS AND COMPLICATIONS You may be at risk for a more severe case of influenza if you smoke cigarettes, have diabetes, have chronic heart disease (such as heart failure) or lung disease (such as asthma), or if you have a weakened immune system. Elderly people and pregnant women are also at risk for more serious infections. The most common problem of influenza is a lung infection (pneumonia). Sometimes, this problem can require emergency medical care and may be life threatening. SIGNS AND SYMPTOMS  Symptoms typically last 4 to 10 days and may include:  Fever.  Chills.  Headache, body aches, and muscle aches.  Sore throat.  Chest discomfort and cough.  Poor appetite.  Weakness or feeling tired.  Dizziness.  Nausea or vomiting. DIAGNOSIS  Diagnosis of influenza is often made  based on your history and a physical exam. A nose or throat swab test can be done to confirm the diagnosis. TREATMENT  In mild cases, influenza goes away on its own. Treatment is directed at relieving symptoms. For more severe cases, your health care provider may prescribe antiviral medicines to shorten the sickness. Antibiotic medicines are not effective because the infection is caused by a virus, not by bacteria. HOME CARE INSTRUCTIONS  Take medicines only  as directed by your health care provider.  Use a cool mist humidifier to make breathing easier.  Get plenty of rest until your temperature returns to normal. This usually takes 3 to 4 days.  Drink enough fluid to keep your urine clear or pale yellow.  Cover yourmouth and nosewhen coughing or sneezing,and wash your handswellto prevent thevirusfrom spreading.  Stay homefromwork orschool untilthe fever is gonefor at least 98full day. PREVENTION  An annual influenza vaccination (flu shot) is the best way to avoid getting influenza. An annual flu shot is now routinely recommended for all adults in the [place] IF:  You experiencechest pain, yourcough worsens,or you producemore mucus.  Youhave nausea,vomiting, ordiarrhea.  Your fever returns or gets worse. SEEK IMMEDIATE MEDICAL CARE IF:  You havetrouble breathing, you become short of breath,or your skin ornails becomebluish.  You have severe painor stiffnessin the neck.  You develop a sudden headache, or pain in the face or ear.  You have nausea or vomiting that you cannot control. MAKE SURE YOU:   Understand these instructions.  Will watch your condition.  Will get help right away if you are not doing well or get worse.   This information is not intended to replace advice given to you by your health care provider. Make sure you discuss any questions you have with your health care provider.   Document Released: 09/01/2000 Document  Revised: 09/25/2014 Document Reviewed: 12/04/2011 Elsevier Interactive Patient Education 2016 [place]. Amariyah Bazar M.D.

## 2016-02-02 NOTE — Patient Instructions (Addendum)
This acts like flu  Confirmed .  Will begin tamiflu   To reduce the lenth of the fever. You are at risk for pneumonia  But exam seems normal today .  fif fever not gone in the next 72 hours contact us for recheck or advice      Influenza, Adult Influenza ("the flu") is a viral infection of the respiratory tract. It occurs more often in winter months because people spend more time in close contact with one another. Influenza can make you feel very sick. Influenza easily spreads from person to person (contagious). CAUSES  Influenza is caused by a virus that infects the respiratory tract. You can catch the virus by breathing in droplets from an infected person's cough or sneeze. You can also catch the virus by touching something that was recently contaminated with the virus and then touching your mouth, nose, or eyes. RISKS AND COMPLICATIONS You may be at risk for a more severe case of influenza if you smoke cigarettes, have diabetes, have chronic heart disease (such as heart failure) or lung disease (such as asthma), or if you have a weakened immune system. Elderly people and pregnant women are also at risk for more serious infections. The most common problem of influenza is a lung infection (pneumonia). Sometimes, this problem can require emergency medical care and may be life threatening. SIGNS AND SYMPTOMS  Symptoms typically last 4 to 10 days and may include:  Fever.  Chills.  Headache, body aches, and muscle aches.  Sore throat.  Chest discomfort and cough.  Poor appetite.  Weakness or feeling tired.  Dizziness.  Nausea or vomiting. DIAGNOSIS  Diagnosis of influenza is often made based on your history and a physical exam. A nose or throat swab test can be done to confirm the diagnosis. TREATMENT  In mild cases, influenza goes away on its own. Treatment is directed at relieving symptoms. For more severe cases, your health care provider may prescribe antiviral medicines to  shorten the sickness. Antibiotic medicines are not effective because the infection is caused by a virus, not by bacteria. HOME CARE INSTRUCTIONS  Take medicines only as directed by your health care provider.  Use a cool mist humidifier to make breathing easier.  Get plenty of rest until your temperature returns to normal. This usually takes 3 to 4 days.  Drink enough fluid to keep your urine clear or pale yellow.  Cover yourmouth and nosewhen coughing or sneezing,and wash your handswellto prevent thevirusfrom spreading.  Stay homefromwork orschool untilthe fever is gonefor at least 78full day. PREVENTION  An annual influenza vaccination (flu shot) is the best way to avoid getting influenza. An annual flu shot is now routinely recommended for all adults in the [place] IF:  You experiencechest pain, yourcough worsens,or you producemore mucus.  Youhave nausea,vomiting, ordiarrhea.  Your fever returns or gets worse. SEEK IMMEDIATE MEDICAL CARE IF:  You havetrouble breathing, you become short of breath,or your skin ornails becomebluish.  You have severe painor stiffnessin the neck.  You develop a sudden headache, or pain in the face or ear.  You have nausea or vomiting that you cannot control. MAKE SURE YOU:   Understand these instructions.  Will watch your condition.  Will get help right away if you are not doing well or get worse.   This information is not intended to replace advice given to you by your health care provider. Make sure you discuss any questions you have with your health care  provider.   Document Released: 09/01/2000 Document Revised: 09/25/2014 Document Reviewed: 12/04/2011 Elsevier Interactive Patient Education [employer].

## 2016-02-03 ENCOUNTER — Telehealth: Payer: Self-pay | Admitting: Internal Medicine

## 2016-02-03 MED ORDER — HYDROCODONE-HOMATROPINE 5-1.5 MG/5ML PO SYRP: ORAL_SOLUTION | ORAL | Status: DC

## 2016-02-03 NOTE — Telephone Encounter (Signed)
Spoke to the pt.  Informed her that prescription was ready for pick up.

## 2016-02-03 NOTE — Telephone Encounter (Signed)
Ok to do

## 2016-02-03 NOTE — Telephone Encounter (Signed)
Pt was a severe cough along with the flu she was dx with yesterday when she saw Dr Regis Bill. Pt is hoping Dr Regis Bill will prescribe the hycodan cough syrup. Pt aware this will need to be picked up if ok'd.

## 2016-02-08 ENCOUNTER — Other Ambulatory Visit: Payer: Self-pay | Admitting: Internal Medicine

## 2016-02-08 ENCOUNTER — Telehealth: Payer: Self-pay | Admitting: Internal Medicine

## 2016-02-08 ENCOUNTER — Ambulatory Visit (INDEPENDENT_AMBULATORY_CARE_PROVIDER_SITE_OTHER): Payer: Commercial Managed Care - PPO | Admitting: Family Medicine

## 2016-02-08 VITALS — BP 140/104 | HR 87 | Temp 98.1°F | Ht 64.0 in | Wt 257.0 lb

## 2016-02-08 DIAGNOSIS — R059 Cough, unspecified: Secondary | ICD-10-CM

## 2016-02-08 DIAGNOSIS — J111 Influenza due to unidentified influenza virus with other respiratory manifestations: Secondary | ICD-10-CM

## 2016-02-08 DIAGNOSIS — J040 Acute laryngitis: Secondary | ICD-10-CM | POA: Diagnosis not present

## 2016-02-08 DIAGNOSIS — R05 Cough: Secondary | ICD-10-CM | POA: Diagnosis not present

## 2016-02-08 MED ORDER — LEVOFLOXACIN 500 MG PO TABS: 500.0000 mg | ORAL_TABLET | Freq: Every day | ORAL | Status: DC

## 2016-02-08 MED ORDER — ALBUTEROL SULFATE HFA 108 (90 BASE) MCG/ACT IN AERS: 2.0000 | INHALATION_SPRAY | Freq: Four times a day (QID) | RESPIRATORY_TRACT | Status: DC | PRN

## 2016-02-08 NOTE — Patient Instructions (Signed)
Do NOT take the Doxepin while taking the Levaquin Stay well hydrated Follow up for any persistent fever or increased shortness of breath.

## 2016-02-08 NOTE — Progress Notes (Signed)
Subjective:    Patient ID: Jennifer [name], female    DOB: 05-14-1976, 40 y.o.   MRN: [id number]  HPI Patient seen for acute visit. Onset of respiratory symptoms last week. She was seen on Wednesday with cough, body aches, fatigue, fever. Positive influenza screen. Patient was treated with Tamiflu. She felt slightly better over the weekend and states that her fever seemed to subside over the weekend but then yesterday had low-grade fever of 100 again. She has laryngitis symptoms. Mild dyspnea with exertion. Cough productive of green sputum. No hemoptysis.  Denies any nausea or vomiting. Keeping down fluids well. She has rheumatoid arthritis which is treated with CIMZIA injections once monthly. Cough has been fairly severe at times. She feels she may be wheezing at times. Has been using Hycodan cough syrup without much relief. No dizziness or headaches. Still has some mild body aches She has doxepin on her medication list but states that she only takes this for hives has not taken this for several weeks Nonsmoker  Past Medical History  Diagnosis Date  . Hypertension   . Breast abscess of female     Recurrent  . Headache(784.0)   . Fibroids     w/bleeding  . Recurrent periodic urticaria   . Pill esophagitis 08/28/2012    amoxicillin  by hx  disc plan nl voice except hoarse not drooling  close fu  if not getting better with plan stop aleve  liquid ibu onlyf necessary   . Acute otitis media 08/28/2012    improved  change to liquid medication   . Migraines     Dr. Orie Rout   Past Surgical History  Procedure Laterality Date  . Breast abscess  2003    removal    reports that she has never smoked. She does not have any smokeless tobacco history on file. She reports that she drinks alcohol. She reports that she does not use illicit drugs. family history is not on file. No Known Allergies    Review of Systems  Constitutional: Positive for fever and fatigue. Negative for  chills.  HENT: Positive for voice change. Negative for sinus pressure and sore throat.   Respiratory: Positive for cough, shortness of breath and wheezing.   Cardiovascular: Negative for chest pain.  Gastrointestinal: Negative for nausea, vomiting and abdominal pain.  Genitourinary: Negative for dysuria.  Skin: Negative for rash.  Neurological: Negative for dizziness.  Hematological: Negative for adenopathy.       Objective:   Physical Exam  Constitutional: She appears well-developed and well-nourished.  HENT:  Right Ear: External ear normal.  Left Ear: External ear normal.  Mouth/Throat: Oropharynx is clear and moist.  Neck: Neck supple. No thyromegaly present.  Cardiovascular: Normal rate and regular rhythm.   Pulmonary/Chest: Effort normal and breath sounds normal. No respiratory distress. She has no wheezes. She has no rales.  Lymphadenopathy:    She has no cervical adenopathy.  Skin: No rash noted.          Assessment & Plan:  #1 cough in a patient recently diagnosed with influenza last week. She has nonfocal exam but concerning is the fact that she was afebrile for couple days and then had reported fever yesterday and is also immunocompromised. She does not have evidence on exam today to suggest reactive airway issues but she thinks she may be wheezing some intermittently. Pulse oximetry 98%. No respiratory distress. We elected to cover with Levaquin 500 mg once daily for 7 days given  her immunosuppressed status with recent influenza and second wave of fever. We've recommended prompt follow-up for any persistent fever or increased shortness of breath.  CXR if not improving over the next few days and sooner prn.  #2 laryngitis. Probably related to recent viral illness. Voice rest and plenty of warm liquids.  #3 recent influenza. Patient finished out course of Tamiflu.  Eulas Post MD [facility] at [facility]

## 2016-02-08 NOTE — Progress Notes (Signed)
Pre visit review using our clinic review tool, if applicable. No additional management support is needed unless otherwise documented below in the visit note. 

## 2016-02-08 NOTE — Telephone Encounter (Signed)
Patient Name: YATZEL VENIER DOB: 04-Aug-1976 Initial Comment Caller states she is needing a prescription and has a fever of 100 with a bad cough. Nurse Assessment Nurse: Roosvelt Maser, RN, Barnetta Chapel Date/Time (Eastern Time): 02/08/2016 3:59:52 PM Confirm and document reason for call. If symptomatic, describe symptoms. You must click the next button to save text entered. ---caller states dx flu last week, fever went away and has come back, and bad cough, feels like she has a hard time catching her breath when she coughs and wants some medications Has the patient traveled out of the country within the last 30 days? ---Not Applicable Does the patient have any new or worsening symptoms? ---Yes Will a triage be completed? ---Yes Related visit to physician within the last 2 weeks? ---Yes Does the PT have any chronic conditions? (i.e. diabetes, asthma, etc.) ---Unknown Is the patient pregnant or possibly pregnant? (Ask all females between the ages of 62-55) ---No Is this a behavioral health or substance abuse call? ---No Guidelines Guideline Title Affirmed Question Affirmed Notes Influenza Follow-up Call [1] Difficulty breathing AND [2] not severe AND [3] not from stuffy nose (e.g., not relieved by cleaning out the nose) Final Disposition User Go to ED Now Roosvelt Maser, RN, Barnetta Chapel Comments office is open, appt scheduled vs sending patient to the ED Referrals REFERRED TO PCP OFFICE Disagree/Comply: Comply

## 2016-02-08 NOTE — Telephone Encounter (Signed)
Pt seen in office today w/ Dr. Sinclair Ship

## 2016-02-15 ENCOUNTER — Encounter: Payer: Self-pay | Admitting: Internal Medicine

## 2016-02-15 ENCOUNTER — Ambulatory Visit (INDEPENDENT_AMBULATORY_CARE_PROVIDER_SITE_OTHER): Payer: Commercial Managed Care - PPO | Admitting: Internal Medicine

## 2016-02-15 VITALS — BP 144/98 | HR 77 | Temp 98.4°F | Ht 64.0 in | Wt 258.0 lb

## 2016-02-15 DIAGNOSIS — I10 Essential (primary) hypertension: Secondary | ICD-10-CM

## 2016-02-15 DIAGNOSIS — R05 Cough: Secondary | ICD-10-CM

## 2016-02-15 DIAGNOSIS — R51 Headache: Secondary | ICD-10-CM

## 2016-02-15 DIAGNOSIS — M069 Rheumatoid arthritis, unspecified: Secondary | ICD-10-CM

## 2016-02-15 DIAGNOSIS — Z79899 Other long term (current) drug therapy: Secondary | ICD-10-CM

## 2016-02-15 DIAGNOSIS — J069 Acute upper respiratory infection, unspecified: Secondary | ICD-10-CM

## 2016-02-15 DIAGNOSIS — R519 Headache, unspecified: Secondary | ICD-10-CM

## 2016-02-15 DIAGNOSIS — R053 Chronic cough: Secondary | ICD-10-CM

## 2016-02-15 MED ORDER — HYDROCODONE-HOMATROPINE 5-1.5 MG/5ML PO SYRP: ORAL_SOLUTION | ORAL | Status: DC

## 2016-02-15 MED ORDER — LABETALOL HCL 200 MG PO TABS: 200.0000 mg | ORAL_TABLET | Freq: Two times a day (BID) | ORAL | Status: DC

## 2016-02-15 NOTE — Progress Notes (Signed)
Chief Complaint  Patient presents with  . Follow-up    HPI: Jennifer [name] 40 y.o.    Has RA  HT   Recurrent HAs .  Since her last visit he still has hoarseness coughing at night respiratory symptoms. Throat  Clearing and coughing .Coughing  Green  And yelllow   .  Gross   phlegm   No blood more at night  And still congested . Inhaler  Made chest feel funny . Was given Levaquin by our colleague May 23 because of high risk history. She has had no fever recently.  BP not in control says it tends to be in the 140-150/100 range. Hasn't given up on pregnancy. No side effects of medicine. Has 100 mg tabs.bp in 140s      Taking 200 bid abetalol.  Headache   is now seen [place] doctor   2  Per week .  To get up to 4 per day . Now to get another tests. Headaches seem to be doing some better. ROS: See pertinent positives and negatives per HPI. Remote history in the past of laryngitis and hoarseness your nose and throat doctor said nothing wrong with her throat.  Past Medical History  Diagnosis Date  . Hypertension   . Breast abscess of female     Recurrent  . Headache(784.0)   . Fibroids     w/bleeding  . Recurrent periodic urticaria   . Pill esophagitis 08/28/2012    amoxicillin  by hx  disc plan nl voice except hoarse not drooling  close fu  if not getting better with plan stop aleve  liquid ibu onlyf necessary   . Acute otitis media 08/28/2012    improved  change to liquid medication   . Migraines     Dr. Orie [name]    No family history on file.  Social History   Social History  . Marital Status: Single    Spouse Name: N/A  . Number of Children: N/A  . Years of Education: N/A   Social History Main Topics  . Smoking status: Never Smoker   . Smokeless tobacco: None  . Alcohol Use: Yes     Comment: occ  . Drug Use: No  . Sexual Activity: Not Asked   Other Topics Concern  . None   Social History Narrative    Outpatient Prescriptions Prior to Visit    Medication Sig Dispense Refill  . albuterol (PROVENTIL HFA;VENTOLIN HFA) 108 (90 Base) MCG/ACT inhaler Inhale 2 puffs into the lungs every 6 (six) hours as needed for wheezing or shortness of breath. 1 Inhaler 0  . baclofen (LIORESAL) 10 MG tablet Take 1 tablet by mouth 2 (two) times daily as needed.  0  . celecoxib (CELEBREX) 200 MG capsule Take 200 mg by mouth 2 (two) times daily.    . Certolizumab Pegol (CIMZIA PREFILLED [place]) Inject 400 mg into the skin every 30 (thirty) days.    . Cholecalciferol (VITAMIN D3) 2000 UNITS TABS Take by mouth.    . doxepin (SINEQUAN) 10 MG capsule Take 10 mg by mouth.    . DUREZOL 0.05 % EMUL Place 1 drop into the right eye daily.  1  . fluticasone (FLONASE) 50 MCG/ACT nasal spray Place 2 sprays into the nose daily. 16 g 6  . IRON PO Take by mouth.    . Multiple Vitamin (MULTI-VITAMIN DAILY PO) Take by mouth.    . zonisamide (ZONEGRAN) 25 MG capsule Take 4 capsules by mouth  at bedtime.  1  . HYDROcodone-homatropine (HYCODAN) 5-1.5 MG/5ML syrup 1 tsp at night or every 4-6 hours if needed for cough 180 mL 0  . labetalol (NORMODYNE) 100 MG tablet Take 2 tablets (200 mg total) by mouth 2 (two) times daily. 120 tablet 3  . levofloxacin (LEVAQUIN) 500 MG tablet Take 1 tablet (500 mg total) by mouth daily. 7 tablet 0   No facility-administered medications prior to visit.     EXAM:  BP 144/98 mmHg  Pulse 77  Temp(Src) 98.4 F (36.9 C) (Oral)  Ht 5\' 4"  (1.626 m)  Wt 258 lb (117.028 kg)  BMI 44.26 kg/m2  SpO2 98%  LMP 01/07/2016 (Exact Date)  Body mass index is 44.26 kg/(m^2).  GENERAL: vitals reviewed and listed above, alert, oriented, appears well hydrated and in no acute distressShe appears severely congested upper airway and has significant hoarseness without stridor HEENT: atraumatic, conjunctiva  clear, no obvious abnormalities on inspection of external nose and ears tms clear  OP : no lesion edema or exudate  Nares very congested  NECK: no obvious  masses on inspection palpation  LUNGS: clear to auscultation bilaterally, no wheezes, rales or rhonchi, adequate air movement no acute wheezing noted has intermittent dry cough. CV: HRRR, no clubbing cyanosis or  peripheral edema nl cap refill  MS: moves all extremities PSYCH: pleasant and cooperative, no obvious depression or anxiety Lab Results  Component Value Date   WBC 4.9 10/29/2009   HGB 14.3 01/12/2012   HCT 42.0 01/12/2012   PLT 322.0 10/29/2009   GLUCOSE 92 02/28/2013   ALT 14 10/29/2009   AST 20 10/29/2009   NA 135 02/28/2013   K 4.0 02/28/2013   CL 102 02/28/2013   CREATININE 0.88 02/28/2013   BUN 10 02/28/2013   CO2 25 02/28/2013   BP Readings from Last 3 Encounters:  02/15/16 144/98  02/08/16 140/104  02/02/16 142/100   Wt Readings from Last 3 Encounters:  02/15/16 258 lb (117.028 kg)  02/08/16 257 lb (116.574 kg)  02/02/16 256 lb (116.121 kg)  Patient reports the labs done through [facility] by her rheumatologist.  ASSESSMENT AND PLAN:  Discussed the following assessment and plan:  Essential hypertension  Recurrent headache - dr Jennifer [name] beginning on suppressant   continue  Cough, persistent - Plan: DG Chest 2 View  Medication management  Acute upper respiratory infection of multiple sites  Rheumatoid arthritis involving multiple sites, unspecified rheumatoid factor presence ([place])  intensify rx  As planned if cough  persistent or progressive plan chest xray   -Patient advised to return or notify health care team  if symptoms worsen ,persist or new concerns arise.  Patient Instructions  If not improving   Then   get chest x ray  Next week.   Avoid environmental smoke .   increase the labetolol  As planned   Max 400 mg twice a day .  ROV   In  2-3 months.    Jennifer [name]. Jennifer [name] M.D.

## 2016-02-15 NOTE — Patient Instructions (Signed)
If not improving   Then   get chest x ray  Next week.   Avoid environmental smoke .   increase the labetolol  As planned   Max 400 mg twice a day .  ROV   In  2-3 months.

## 2016-02-15 NOTE — Progress Notes (Signed)
Pre visit review using our clinic review tool, if applicable. No additional management support is needed unless otherwise documented below in the visit note. 

## 2016-02-15 NOTE — Assessment & Plan Note (Signed)
Still hasn't given up on fertility treatment. Increase intensify the labetalol up to 800 mg total per day. Consider adding other medication or switching regimen. Reviewed with patient she understands.

## 2016-02-24 ENCOUNTER — Telehealth: Payer: Self-pay | Admitting: Internal Medicine

## 2016-02-24 NOTE — Telephone Encounter (Signed)
April with [facility] states pt was seen today and her bp was elevated. April states it was elevated at pt's last visit as well. Wanted Dr Regis Bill to know

## 2016-03-06 NOTE — Telephone Encounter (Signed)
What was the blood pressure? Please document.  Thanks!!

## 2016-03-07 NOTE — Telephone Encounter (Signed)
She did not say, just that it was elevated.

## 2016-03-29 DIAGNOSIS — F32A Depression, unspecified: Secondary | ICD-10-CM | POA: Insufficient documentation

## 2016-03-29 DIAGNOSIS — F419 Anxiety disorder, unspecified: Secondary | ICD-10-CM | POA: Insufficient documentation

## 2016-03-29 HISTORY — DX: Anxiety disorder, unspecified: F41.9

## 2016-03-29 HISTORY — DX: Depression, unspecified: F32.A

## 2016-05-11 NOTE — Progress Notes (Signed)
Chief Complaint  Patient presents with  . Follow-up    HPI: Jennifer [name] 40 y.o. comes in for follow-up of her blood pressure management. She's struggling recently ever since the death of her brother in Dec 05, 2022 and also dealing with the fertility issues that is going to require surgery to take out "25 fibroids". She hasn't been taking her labetalol at her regular dose has been taking 200 mg twice a day but only occasionally has gone off on the dose her diastolic tends to be 0000000 and tends to go up with stress. She also doesn't take the doses if she goes out and has a beer with her family which isn't very often. She's afraid of mixing medicines with alcohol. Her prescribing provider started her on fluoxetine 10 mg a day she hasn't really taken it regularly mostly because she doesn't want to take too many pills and it does remind her of her difficulties. But she has been crying very frequently and very emotional at times. She's been also placed on D3 50,000 units for low level states that she probably should be on iron pharynx 150 prescribed by others in the past apparently recently had lab work in the last few weeks. She is seeing a fertility specialist and her rheumatologist. ROS: See pertinent positives and negatives per HPI.  Past Medical History:  Diagnosis Date  . Acute otitis media 08/28/2012   improved  change to liquid medication   . Breast abscess of female    Recurrent  . Fibroids    w/bleeding  . Headache(784.0)   . Hypertension   . Migraines    Dr. Orie Rout  . Pill esophagitis 08/28/2012   amoxicillin  by hx  disc plan nl voice except hoarse not drooling  close fu  if not getting better with plan stop aleve  liquid ibu onlyf necessary   . Recurrent periodic urticaria     No family history on file.  Social History   Social History  . Marital status: Single    Spouse name: N/A  . Number of children: N/A  . Years of education: N/A   Social History Main  Topics  . Smoking status: Never Smoker  . Smokeless tobacco: None  . Alcohol use Yes     Comment: occ  . Drug use: No  . Sexual activity: Not Asked   Other Topics Concern  . None   Social History Narrative  . None    Outpatient Medications Prior to Visit  Medication Sig Dispense Refill  . baclofen (LIORESAL) 10 MG tablet Take 1 tablet by mouth 2 (two) times daily as needed.  0  . celecoxib (CELEBREX) 200 MG capsule Take 200 mg by mouth 2 (two) times daily.    . Certolizumab Pegol (CIMZIA PREFILLED [place]) Inject 400 mg into the skin every 30 (thirty) days.    [name] doxepin (SINEQUAN) 10 MG capsule Take 10 mg by mouth.    . fluticasone (FLONASE) 50 MCG/ACT nasal spray Place 2 sprays into the nose daily. 16 g 6  . IRON PO Take by mouth.    . Multiple Vitamin (MULTI-VITAMIN DAILY PO) Take by mouth.    . zonisamide (ZONEGRAN) 25 MG capsule Take 4 capsules by mouth at bedtime.  1  . labetalol (NORMODYNE) 200 MG tablet Take 1 tablet (200 mg total) by mouth 2 (two) times daily. Increase  to 300 mg twice a day and then 400 mg twice a day as directed . 120 tablet 3  .  Cholecalciferol (VITAMIN D3) 2000 UNITS TABS Take by mouth.    [name] albuterol (PROVENTIL HFA;VENTOLIN HFA) 108 (90 Base) MCG/ACT inhaler Inhale 2 puffs into the lungs every 6 (six) hours as needed for wheezing or shortness of breath. 1 Inhaler 0  . DUREZOL 0.05 % EMUL Place 1 drop into the right eye daily.  1  . HYDROcodone-homatropine (HYCODAN) 5-1.5 MG/5ML syrup 1 tsp at night or every 4-6 hours if needed for cough 180 mL 0   No facility-administered medications prior to visit.      EXAM:  BP (!) 156/90 (BP Location: Right Arm, Patient Position: Sitting, Cuff Size: Large)   Temp 98.5 F (36.9 C) (Oral)   Wt 260 lb 1.6 oz (118 kg)   BMI 44.65 kg/m   Body mass index is 44.65 kg/m.  GENERAL: vitals reviewed and listed above, alert, oriented, appears well hydrated and in no acute distressEmotional tearful at times but normal  insight and speech HEENT: atraumatic, conjunctiva  clear, no obvious abnormalities on inspection of external nose and ears OP : no lesion edema or exudate  NECK: no obvious masses on inspection palpation  LUNGS: clear to auscultation bilaterally, no wheezes, rales or rhonchi, good air movement CV: HRRR, no clubbing cyanosis or  peripheral edema nl cap refill  MS: moves all extremities without noticeable focal  abnormality PSYCH: As above sad and emotional. Stressed cooperative   BP Readings from Last 3 Encounters:  05/12/16 (!) 156/90  02/15/16 (!) 144/98  02/08/16 (!) 140/104   Wt Readings from Last 3 Encounters:  05/12/16 260 lb 1.6 oz (118 kg)  02/15/16 258 lb (117 kg)  02/08/16 257 lb (116.6 kg)    ASSESSMENT AND PLAN:  Discussed the following assessment and plan:  Essential hypertension  High risk medication use  Rheumatoid arthritis involving multiple sites, unspecified rheumatoid factor presence ([place])  Bereavement  Female fertility problem  Adjustment disorder with depressed mood  Current non-adherence to medical treatment - see note  Health Maintenance Due  Topic Date Due  . PNEUMOCOCCAL POLYSACCHARIDE VACCINE (1) 06/25/1977  . HIV Screening  06/25/1990  . TETANUS/TDAP  06/25/1994  . PAP SMEAR  06/25/1996  . INFLUENZA VACCINE  04/18/2016   It appears we've been having some either miscommunication or not taking medicine on a regular basis at the consistent dosing. She is on labetalol because she is trying to get pregnant. There are other options that we can add. She will take 600 mg equivalent a day and then contact us so we can increase the dose if not controlled. I want her to come back in 2 months either way to reevaluate what is going on. Strongly encouraged her to take the fluoxetine and give it a trial for reasons discussed. Discussed that she may be making poor decisions for her health without realizing it. She's having multiple losses and stressors at this  time. -Patient advised to return or notify health care team  if symptoms worsen ,persist or new concerns arise. Total visit 40 mins > 50% spent counseling and coordinating care as indicated in above note and in instructions to patient .    Plans on getting flu shot later in the season perhaps October.  Patient Instructions  Your blood pressure is not controlled but when you don't take the same dose every date is hard to tell how to adjust the medication to help you.   Please take the blood pressure medicine labetalol at the same dose every day for at least  3-4 weeks to see if it helps her blood pressure control. Then if it is still elevated we will increase the daily dose.  I would take 300 mg twice a day every day. Please let us know after 3-4 weeks which her blood pressure is doing  goal blood pressure is below 140/90.  Suggest continue your counseling and consider looking into hospice and palliative care of [place] bereavement services regarding what you're going through with the loss of your brother.  I want you to come back where an office visit in 2 months we can working in. We may need to make adjustments to your medicines.  I would like you to try taking the fluoxetine every day for at least 4-6 weeks contact her prescriber about how it is working or not. This kind of medicine works better to take it every day area she don't think it is helping you should talk to the prescriber so you can discontinue it or change to something more effective.    Standley Brooking. Panosh M.D.

## 2016-05-12 ENCOUNTER — Encounter: Payer: Self-pay | Admitting: Internal Medicine

## 2016-05-12 ENCOUNTER — Ambulatory Visit (INDEPENDENT_AMBULATORY_CARE_PROVIDER_SITE_OTHER): Payer: Commercial Managed Care - PPO | Admitting: Internal Medicine

## 2016-05-12 VITALS — BP 156/90 | Temp 98.5°F | Wt 260.1 lb

## 2016-05-12 DIAGNOSIS — Z79899 Other long term (current) drug therapy: Secondary | ICD-10-CM

## 2016-05-12 DIAGNOSIS — M069 Rheumatoid arthritis, unspecified: Secondary | ICD-10-CM | POA: Diagnosis not present

## 2016-05-12 DIAGNOSIS — I10 Essential (primary) hypertension: Secondary | ICD-10-CM | POA: Diagnosis not present

## 2016-05-12 DIAGNOSIS — Z91199 Patient's noncompliance with other medical treatment and regimen due to unspecified reason: Secondary | ICD-10-CM

## 2016-05-12 DIAGNOSIS — Z9119 Patient's noncompliance with other medical treatment and regimen: Secondary | ICD-10-CM

## 2016-05-12 DIAGNOSIS — F4321 Adjustment disorder with depressed mood: Secondary | ICD-10-CM

## 2016-05-12 DIAGNOSIS — N979 Female infertility, unspecified: Secondary | ICD-10-CM

## 2016-05-12 DIAGNOSIS — Z634 Disappearance and death of family member: Secondary | ICD-10-CM

## 2016-05-12 MED ORDER — LABETALOL HCL 200 MG PO TABS: ORAL_TABLET | ORAL | 3 refills | Status: DC

## 2016-05-12 NOTE — Patient Instructions (Addendum)
Your blood pressure is not controlled but when you don't take the same dose every date is hard to tell how to adjust the medication to help you.   Please take the blood pressure medicine labetalol at the same dose every day for at least 3-4 weeks to see if it helps her blood pressure control. Then if it is still elevated we will increase the daily dose.  I would take 300 mg twice a day every day. Please let us know after 3-4 weeks which her blood pressure is doing  goal blood pressure is below 140/90.  Suggest continue your counseling and consider looking into hospice and palliative care of [facility] regarding what you're going through with the loss of your brother.  I want you to come back where an office visit in 2 months we can working in. We may need to make adjustments to your medicines.  I would like you to try taking the fluoxetine every day for at least 4-6 weeks contact her prescriber about how it is working or not. This kind of medicine works better to take it every day area she don't think it is helping you should talk to the prescriber so you can discontinue it or change to something more effective.

## 2016-05-17 ENCOUNTER — Ambulatory Visit: Payer: Commercial Managed Care - PPO | Admitting: Internal Medicine

## 2016-05-19 ENCOUNTER — Encounter: Payer: Self-pay | Admitting: Internal Medicine

## 2016-05-24 MED ORDER — POLYSACCHARIDE IRON COMPLEX 150 MG PO CAPS: 150.0000 mg | ORAL_CAPSULE | Freq: Every day | ORAL | 5 refills | Status: DC

## 2016-05-24 NOTE — Telephone Encounter (Signed)
I was out of office last week   I sent in  Medication  Please let her know

## 2016-05-29 ENCOUNTER — Encounter (HOSPITAL_COMMUNITY): Payer: Self-pay | Admitting: Emergency Medicine

## 2016-05-29 ENCOUNTER — Emergency Department (HOSPITAL_COMMUNITY)
Admission: EM | Admit: 2016-05-29 | Discharge: 2016-05-29 | Disposition: A | Discharge status: Home / Self Care | Payer: Commercial Managed Care - PPO | Attending: Dermatology | Admitting: Dermatology

## 2016-05-29 DIAGNOSIS — Z79899 Other long term (current) drug therapy: Secondary | ICD-10-CM | POA: Diagnosis not present

## 2016-05-29 DIAGNOSIS — G43909 Migraine, unspecified, not intractable, without status migrainosus: Secondary | ICD-10-CM | POA: Diagnosis not present

## 2016-05-29 DIAGNOSIS — I10 Essential (primary) hypertension: Secondary | ICD-10-CM | POA: Insufficient documentation

## 2016-05-29 DIAGNOSIS — Z5321 Procedure and treatment not carried out due to patient leaving prior to being seen by health care provider: Secondary | ICD-10-CM | POA: Insufficient documentation

## 2016-05-29 NOTE — ED Triage Notes (Signed)
Pt c/o migraine x 2 days. Pt has hx of the same and has two prescription medications she takes at home for them. Pt sts they have not relieved the headache at this time. Pt c/o light sensitivity. C/o nausea, no vomiting. A&Ox4 and ambulatory.

## 2016-05-29 NOTE — ED Notes (Signed)
Pt gave stickers to registration and sts she is leaving.

## 2016-05-30 ENCOUNTER — Emergency Department (HOSPITAL_COMMUNITY)
Admission: EM | Admit: 2016-05-30 | Discharge: 2016-05-30 | Disposition: A | Discharge status: Home / Self Care | Payer: Commercial Managed Care - PPO | Attending: Emergency Medicine | Admitting: Emergency Medicine

## 2016-05-30 DIAGNOSIS — Z79899 Other long term (current) drug therapy: Secondary | ICD-10-CM | POA: Diagnosis not present

## 2016-05-30 DIAGNOSIS — R51 Headache: Secondary | ICD-10-CM | POA: Insufficient documentation

## 2016-05-30 DIAGNOSIS — G43909 Migraine, unspecified, not intractable, without status migrainosus: Secondary | ICD-10-CM | POA: Diagnosis present

## 2016-05-30 DIAGNOSIS — I10 Essential (primary) hypertension: Secondary | ICD-10-CM | POA: Diagnosis not present

## 2016-05-30 DIAGNOSIS — R519 Headache, unspecified: Secondary | ICD-10-CM

## 2016-05-30 MED ORDER — DEXAMETHASONE SODIUM PHOSPHATE 10 MG/ML IJ SOLN
10.0000 mg | Freq: Once | INTRAMUSCULAR | Status: AC
  Administered 2016-05-30: 10 mg via INTRAVENOUS
  Filled 2016-05-30: qty 1

## 2016-05-30 MED ORDER — SODIUM CHLORIDE 0.9 % IV BOLUS (SEPSIS)
1000.0000 mL | Freq: Once | INTRAVENOUS | Status: AC
  Administered 2016-05-30: 1000 mL via INTRAVENOUS

## 2016-05-30 MED ORDER — METOCLOPRAMIDE HCL 5 MG/ML IJ SOLN
10.0000 mg | Freq: Once | INTRAMUSCULAR | Status: AC
  Administered 2016-05-30: 10 mg via INTRAVENOUS
  Filled 2016-05-30: qty 2

## 2016-05-30 MED ORDER — DIPHENHYDRAMINE HCL 50 MG/ML IJ SOLN
25.0000 mg | Freq: Once | INTRAMUSCULAR | Status: AC
  Administered 2016-05-30: 25 mg via INTRAVENOUS
  Filled 2016-05-30: qty 1

## 2016-05-30 NOTE — ED Notes (Signed)
EDPA made aware of BP.

## 2016-05-30 NOTE — ED Notes (Signed)
Patient was alert, oriented and stable upon discharge. RN went over AVS and patient had no further questions.  

## 2016-05-30 NOTE — ED Triage Notes (Signed)
Pt states that she has had a migraine x 3.5 days and has tried her at home meds w/o relief. Alert and oriented. Neuro intact.

## 2016-05-30 NOTE — ED Notes (Signed)
ED PA at bedside

## 2016-05-30 NOTE — ED Provider Notes (Signed)
[facility] DEPT Provider Note   CSN: [id number] Arrival date & time: 05/30/16  1812     History   Chief Complaint Chief Complaint  Patient presents with  . Migraine    HPI Jennifer [name] is a 40 y.o. female.  The history is provided by the patient and medical records.    40 year old female with history of uterine fibroids, hypertension, chronic migraines, presenting to the ED for migraine headache. She states this is been ongoing for the past 3 and half days. She states she came here last night but due to to the long wait she left without being seen. States pain currently is localized to the left side of her head which she describes as a throbbing sensation. She reports associated photophobia, phonophobia, dizziness, and nausea. She denies any blurred vision, focal numbness, weakness, difficulty walking, changes in speech, or gait disturbance. She states she has been taking her home migraine medications without any relief. She has not had any fever or chills. No recent head trauma. She is not currently on any type of anticoagulation. She has no history of TIA or stroke.  Past Medical History:  Diagnosis Date  . Acute otitis media 08/28/2012   improved  change to liquid medication   . Breast abscess of female    Recurrent  . Fibroids    w/bleeding  . Headache(784.0)   . Hypertension   . Migraines    Dr. Orie Rout  . Pill esophagitis 08/28/2012   amoxicillin  by hx  disc plan nl voice except hoarse not drooling  close fu  if not getting better with plan stop aleve  liquid ibu onlyf necessary   . Recurrent periodic urticaria     Patient Active Problem List   Diagnosis Date Noted  . Fibroid, uterine   . Essential hypertension 05/25/2015  . Recurrent headache 05/25/2015  . Head lump 07/31/2014  . Edema 12/29/2013  . Baker's cyst, ruptured 12/25/2013  . Cough, persistent 12/12/2013  . Left leg swelling 12/12/2013  . Cough 12/12/2013  . High risk medication use  12/12/2013  . Recurrent periodic urticaria   . Rheumatoid arthritis ([place]) 08/28/2012  . CHRONIC LARYNGITIS 11/03/2010  . ALLERGIC RHINITIS 11/03/2010  . PARESTHESIA 07/28/2010  . DYSMENORRHEA 10/15/2007  . FIBROIDS, UTERUS 07/24/2007  . OBESITY 07/24/2007  . HYPERTENSION 07/24/2007  . SLEEPLESSNESS 07/24/2007  . HEADACHE 07/24/2007    Past Surgical History:  Procedure Laterality Date  . breast abscess  2003   removal    OB History    No data available       Home Medications    Prior to Admission medications   Medication Sig Start Date End Date Taking? Authorizing Provider  baclofen (LIORESAL) 10 MG tablet Take 1 tablet by mouth 2 (two) times daily as needed. 01/28/16  Yes Historical Provider, MD  celecoxib (CELEBREX) 200 MG capsule Take 200 mg by mouth 2 (two) times daily as needed for mild pain or moderate pain.    Yes Bo Merino, MD  Certolizumab Pegol (CIMZIA PREFILLED [place]) Inject 400 mg into the skin every 30 (thirty) days.   Yes Bo Merino, MD  Cholecalciferol 50000 units capsule Take 50,000 Units by mouth once a week.   Yes Historical Provider, MD  FLUoxetine (PROZAC) 10 MG capsule Take 10 mg by mouth daily. 04/03/16  Yes Historical Provider, MD  iron polysaccharides (FERREX 150) 150 MG capsule Take 1 capsule (150 mg total) by mouth daily. 05/24/16  Yes Burnis Medin,  MD  labetalol (NORMODYNE) 100 MG tablet Take 100 mg by mouth every evening.    Yes Historical Provider, MD  labetalol (NORMODYNE) 200 MG tablet Take 300 mg twice a day can increase to 400 mg twice a day for bp control Patient taking differently: Take 200 mg by mouth every morning.  05/12/16  Yes Burnis Medin, MD  zonisamide (ZONEGRAN) 25 MG capsule Take 2 capsules by mouth at bedtime.  01/28/16  Yes Historical Provider, MD  fluticasone (FLONASE) 50 MCG/ACT nasal spray Place 2 sprays into the nose daily. Patient not taking: Reported on 05/30/2016 08/21/12   Lucretia Kern, DO    Family History No  family history on file.  Social History Social History  Substance Use Topics  . Smoking status: Never Smoker  . Smokeless tobacco: Not on file  . Alcohol use Yes     Comment: occ     Allergies   Review of patient's allergies indicates no known allergies.   Review of Systems Review of Systems  Neurological: Positive for headaches.  All other systems reviewed and are negative.    Physical Exam Updated Vital Signs BP (!) 191/105 (BP Location: Right Arm)   Pulse (!) 52   Temp 98.5 F (36.9 C) (Oral)   Resp 18   LMP 05/23/2016   SpO2 100%   Physical Exam  Constitutional: She is oriented to person, place, and time. She appears well-developed and well-nourished. No distress.  Texting on cell phone during exam  HENT:  Head: Normocephalic and atraumatic.  Right Ear: External ear normal.  Left Ear: External ear normal.  Mouth/Throat: Oropharynx is clear and moist.  Pupils symmetric and reactive bilaterally  Eyes: Conjunctivae and EOM are normal. Pupils are equal, round, and reactive to light.  Neck: Normal range of motion and full passive range of motion without pain. Neck supple. No neck rigidity.  No rigidity, no meningismus  Cardiovascular: Normal rate, regular rhythm and normal heart sounds.   No murmur heard. Pulmonary/Chest: Effort normal and breath sounds normal. No respiratory distress. She has no wheezes. She has no rhonchi.  Abdominal: Soft. Bowel sounds are normal. There is no tenderness. There is no guarding.  Musculoskeletal: Normal range of motion. She exhibits no edema.  Neurological: She is alert and oriented to person, place, and time. She has normal strength. She displays no tremor. No cranial nerve deficit or sensory deficit. She displays no seizure activity.  AAOx3, answering questions and following commands appropriately; equal strength UE and LE bilaterally; CN grossly intact; moves all extremities appropriately without ataxia; no focal neuro deficits or  facial asymmetry appreciated  Skin: Skin is warm and dry. No rash noted. She is not diaphoretic.  Psychiatric: She has a normal mood and affect. Her behavior is normal. Thought content normal.  Nursing note and vitals reviewed.    ED Treatments / Results  Labs (all labs ordered are listed, but only abnormal results are displayed) Labs Reviewed - No data to display  EKG  EKG Interpretation None       Radiology No results found.  Procedures Procedures (including critical care time)  Medications Ordered in ED Medications  diphenhydrAMINE (BENADRYL) injection 25 mg (25 mg Intravenous Given 05/30/16 2059)  metoCLOPramide (REGLAN) injection 10 mg (10 mg Intravenous Given 05/30/16 2059)  dexamethasone (DECADRON) injection 10 mg (10 mg Intravenous Given 05/30/16 2059)  sodium chloride 0.9 % bolus 1,000 mL (0 mLs Intravenous Stopped 05/30/16 2230)     Initial Impression / Assessment  and Plan / ED Course  I have reviewed the triage vital signs and the nursing notes.  Pertinent labs & imaging results that were available during my care of the patient were reviewed by me and considered in my medical decision making (see chart for details).  Clinical Course   40 year old female here with migraine headache. She reports this is been ongoing for the past 3 days. Reports history of same. She is afebrile and nontoxic. Her neurologic exam is nonfocal. She has no clinical signs or symptoms suggestive of meningitis. She was treated with migraine cocktail with resolution of headache. She was initially hypertensive, this improved in the ED after treatment of her headache.  Patient remains neurologically intact.  Appears stable for discharge.  She was encouraged to continue her home migraine medications.  Follow-up with PCP.  Discussed plan with patient, she acknowledged understanding and agreed with plan of care.  Return precautions given for new or worsening symptoms.  Final Clinical Impressions(s) /  ED Diagnoses   Final diagnoses:  Headache, unspecified headache type    New Prescriptions Discharge Medication List as of 05/30/2016 11:15 PM       Larene Pickett, PA-C 05/31/16 0000    Lacretia Leigh, MD 06/03/16 0800

## 2016-05-30 NOTE — Discharge Instructions (Signed)
Continue your regular home medications. °Follow-up with your primary care doctor. °Return to the ED for new or worsening symptoms. °

## 2016-06-01 ENCOUNTER — Encounter: Payer: Self-pay | Admitting: Internal Medicine

## 2016-06-02 NOTE — Telephone Encounter (Signed)
Just got your message today.  Please call her  And  Have her increase her labetolol to  300  mg twice a day and contact  us after the  Weekend  about what the readings are .   Did you see the  Headache doc today   ?  What is the plan ?

## 2016-06-02 NOTE — Telephone Encounter (Signed)
Spoke to the pt and informed her to take labetalol 300 mg bid. Will monitor readings over the weekend and will send in on MyChart come Monday.  She did see headache specialist today and received injections.

## 2016-06-05 ENCOUNTER — Encounter: Payer: Self-pay | Admitting: Internal Medicine

## 2016-06-14 NOTE — Progress Notes (Signed)
Chief Complaint  Patient presents with  . Follow-up    HPI: Jennifer [name] 40 y.o.  Fu hypertension and headaches  Was in ed for ha  On sept  11  Felt to have a migraine?  bp readings were  160 180 range  And no  rx given for this  Going down bp .  Now is on  200 tid  .  Hand in 140 at home   tak Trigger points   .   Still having pretty much daily headaches with migraine. Is a bit frustrated about the injections and the treatment she continues to have pain. The baclofen has been changed to Robaxin. Wonders if she needs a head scan. Was a bit concerned that her blood pressure was 200 when she was in the emergency room. She hasn't been back to his rheumatologist recently her joint pains are not problematic because her headaches are more of a problem. ROS: See pertinent positives and negatives per HPI.  Past Medical History:  Diagnosis Date  . Acute otitis media 08/28/2012   improved  change to liquid medication   . Breast abscess of female    Recurrent  . Fibroids    w/bleeding  . Headache(784.0)   . Hypertension   . Migraines    Dr. Orie Rout  . Pill esophagitis 08/28/2012   amoxicillin  by hx  disc plan nl voice except hoarse not drooling  close fu  if not getting better with plan stop aleve  liquid ibu onlyf necessary   . Recurrent periodic urticaria     No family history on file.  Social History   Social History  . Marital status: Single    Spouse name: N/A  . Number of children: N/A  . Years of education: N/A   Social History Main Topics  . Smoking status: Never Smoker  . Smokeless tobacco: None  . Alcohol use Yes     Comment: occ  . Drug use: No  . Sexual activity: Not Asked   Other Topics Concern  . None   Social History Narrative  . None    Outpatient Medications Prior to Visit  Medication Sig Dispense Refill  . baclofen (LIORESAL) 10 MG tablet Take 1 tablet by mouth 2 (two) times daily as needed.  0  . celecoxib (CELEBREX) 200 MG capsule  Take 200 mg by mouth 2 (two) times daily as needed for mild pain or moderate pain.     . Certolizumab Pegol (CIMZIA PREFILLED [place]) Inject 400 mg into the skin every 30 (thirty) days.    . Cholecalciferol 50000 units capsule Take 50,000 Units by mouth once a week.    [name] FLUoxetine (PROZAC) 10 MG capsule Take 10 mg by mouth daily.  5  . iron polysaccharides (FERREX 150) 150 MG capsule Take 1 capsule (150 mg total) by mouth daily. 30 capsule 5  . labetalol (NORMODYNE) 200 MG tablet Take 300 mg twice a day can increase to 400 mg twice a day for bp control (Patient taking differently: Take 200 mg by mouth 3 (three) times daily. ) 120 tablet 3  . fluticasone (FLONASE) 50 MCG/ACT nasal spray Place 2 sprays into the nose daily. 16 g 6  . labetalol (NORMODYNE) 100 MG tablet Take 100 mg by mouth every evening.     . zonisamide (ZONEGRAN) 25 MG capsule Take 2 capsules by mouth at bedtime.   1   No facility-administered medications prior to visit.  EXAM:  BP 136/90   Pulse 71   Temp 97.5 F (36.4 C) (Oral)   Ht 5\' 4"  (1.626 m)   Wt 261 lb (118.4 kg)   LMP 05/23/2016   SpO2 98%   BMI 44.80 kg/m   Body mass index is 44.8 kg/m.  GENERAL: vitals reviewed and listed above, alert, oriented, appears well hydrated and in no acute distressWearing dark glasses for comfort but looks pretty well today. HEENT: atraumatic, conjunctiva  clear, no obvious abnormalities on inspection of external nose and ears NECK: no obvious masses on inspection palpation  LUNGS: clear to auscultation bilaterally, no wheezes, rales or rhonchi, good air movement CV: HRRR, no clubbing cyanosis or  peripheral edema nl cap refill  MS: moves all extremities without noticeable focal  abnormality PSYCH: pleasant and cooperative, no obvious depression or anxiety Lab Results  Component Value Date   WBC 4.9 10/29/2009   HGB 14.3 01/12/2012   HCT 42.0 01/12/2012   PLT 322.0 10/29/2009   GLUCOSE 92 02/28/2013   ALT 14  10/29/2009   AST 20 10/29/2009   NA 135 02/28/2013   K 4.0 02/28/2013   CL 102 02/28/2013   CREATININE 0.88 02/28/2013   BUN 10 02/28/2013   CO2 25 02/28/2013   BP Readings from Last 3 Encounters:  06/15/16 136/90  05/30/16 163/100  05/29/16 (!) 167/104   Wt Readings from Last 3 Encounters:  06/15/16 261 lb (118.4 kg)  05/12/16 260 lb 1.6 oz (118 kg)  02/15/16 258 lb (117 kg)    ASSESSMENT AND PLAN:  Discussed the following assessment and plan:  Essential hypertension - improving with imporved adherance to med   Rheumatoid arthritis involving multiple sites, unspecified rheumatoid factor presence (HCC)  Recurrent headache - see disc  fu with dr  Domingo Cocking  Medication management Fortunately her blood pressure is coming down because she is now much more adherent to taking the medicine out via 3 times a day. We have the option of increasing the dose or adding another medicine. At this time we'll wait another 2-3 weeks and see what her readings are in she can send those in on my chart as before. If needed we can go up to 800 mg a day of labetalol and/or add long-acting anesthetic HEENT if needed. Regard to her headaches which is most problematic try to help her clarify questions she had about success of treatment options. Usually no need for head scan unless there is a neurologic change and may not help treatment plan. Question whether she could have a cervicogenic component to her headache and also ask Dr. Keturah Barre sure about this. We went over her medicines uncertain if any of the medicines could contribute to her headaches. She is taking temazepam at night for sleep from her behavioral health specialist caution about regular use of this medicine. -Patient advised to return or notify health care team  if symptoms worsen ,persist or new concerns arise. Total visit 96mins > 50% spent counseling and coordinating care as indicated in above note and in instructions to patient .     Patient  Instructions  Continue talking same dose  200 mg  3 x per day   Labetolol.   600 mg per day  For another 3 weeks.  And send in readings and then decide on fu visit .  Please ask your specialist the ?s we  Defined to help[ you.   If bp  Is ok can refill med and plan follow up.  Standley Brooking. Panosh M.D.

## 2016-06-15 ENCOUNTER — Ambulatory Visit (INDEPENDENT_AMBULATORY_CARE_PROVIDER_SITE_OTHER): Payer: Commercial Managed Care - PPO | Admitting: Internal Medicine

## 2016-06-15 ENCOUNTER — Encounter: Payer: Self-pay | Admitting: Internal Medicine

## 2016-06-15 VITALS — BP 136/90 | HR 71 | Temp 97.5°F | Ht 64.0 in | Wt 261.0 lb

## 2016-06-15 DIAGNOSIS — Z79899 Other long term (current) drug therapy: Secondary | ICD-10-CM

## 2016-06-15 DIAGNOSIS — M069 Rheumatoid arthritis, unspecified: Secondary | ICD-10-CM | POA: Diagnosis not present

## 2016-06-15 DIAGNOSIS — R519 Headache, unspecified: Secondary | ICD-10-CM

## 2016-06-15 DIAGNOSIS — R51 Headache: Secondary | ICD-10-CM

## 2016-06-15 DIAGNOSIS — I10 Essential (primary) hypertension: Secondary | ICD-10-CM | POA: Diagnosis not present

## 2016-06-15 NOTE — Patient Instructions (Signed)
Continue talking same dose  200 mg  3 x per day   Labetolol.   600 mg per day  For another 3 weeks.  And send in readings and then decide on fu visit .  Please ask your specialist the ?s we  Defined to help[ you.   If bp  Is ok can refill med and plan follow up.

## 2016-06-15 NOTE — Progress Notes (Signed)
Pre visit review using our clinic review tool, if applicable. No additional management support is needed unless otherwise documented below in the visit note. 

## 2016-06-24 ENCOUNTER — Other Ambulatory Visit: Payer: Self-pay | Admitting: Radiology

## 2016-06-24 DIAGNOSIS — Z79899 Other long term (current) drug therapy: Secondary | ICD-10-CM

## 2016-07-14 ENCOUNTER — Ambulatory Visit: Payer: Commercial Managed Care - PPO

## 2016-07-24 ENCOUNTER — Encounter: Payer: Self-pay | Admitting: Internal Medicine

## 2016-07-24 ENCOUNTER — Encounter: Payer: Self-pay | Admitting: Family Medicine

## 2016-07-24 ENCOUNTER — Ambulatory Visit (INDEPENDENT_AMBULATORY_CARE_PROVIDER_SITE_OTHER): Payer: Commercial Managed Care - PPO | Admitting: Internal Medicine

## 2016-07-24 VITALS — BP 132/70 | HR 86 | Temp 98.3°F | Wt 260.3 lb

## 2016-07-24 DIAGNOSIS — J029 Acute pharyngitis, unspecified: Secondary | ICD-10-CM

## 2016-07-24 DIAGNOSIS — R509 Fever, unspecified: Secondary | ICD-10-CM | POA: Diagnosis not present

## 2016-07-24 DIAGNOSIS — J0300 Acute streptococcal tonsillitis, unspecified: Secondary | ICD-10-CM | POA: Diagnosis not present

## 2016-07-24 DIAGNOSIS — Z20828 Contact with and (suspected) exposure to other viral communicable diseases: Secondary | ICD-10-CM

## 2016-07-24 DIAGNOSIS — M069 Rheumatoid arthritis, unspecified: Secondary | ICD-10-CM

## 2016-07-24 LAB — POCT RAPID STREP A (OFFICE): Rapid Strep A Screen: POSITIVE — AB

## 2016-07-24 LAB — POCT INFLUENZA A/B
Influenza A, POC: NEGATIVE
Influenza B, POC: NEGATIVE

## 2016-07-24 MED ORDER — AMOXICILLIN 500 MG PO CAPS: 500.0000 mg | ORAL_CAPSULE | Freq: Three times a day (TID) | ORAL | 0 refills | Status: DC

## 2016-07-24 MED ORDER — OSELTAMIVIR PHOSPHATE 75 MG PO CAPS: 75.0000 mg | ORAL_CAPSULE | Freq: Two times a day (BID) | ORAL | 0 refills | Status: DC

## 2016-07-24 NOTE — Progress Notes (Signed)
Pre visit review using our clinic review tool, if applicable. No additional management support is needed unless otherwise documented below in the visit note.  Chief Complaint  Patient presents with  . Hoarse    Started on Friday.  Cough and vomiting over the weekend.  . Generalized Body Aches  . Sore Throat  . Nasal Congestion  . Fever  . Chills    HPI: Jennifer [name] 40 y.o.  sda flu like illness  Onset cough    Throat is hurting badly .  Fever  102 yesterday and hard to sleep with achiness.  Cough  And now severe myalgias achiness   Exposed to small children and also  A "baby" at [place] had the flu and in the family.  Had hives last pm  Not typical   Gone now.  No rash today  maialse and alos congested .    ROS: See pertinent positives and negatives per HPI.  Past Medical History:  Diagnosis Date  . Acute otitis media 08/28/2012   improved  change to liquid medication   . Breast abscess of female    Recurrent  . Fibroids    w/bleeding  . Headache(784.0)   . Hypertension   . Migraines    Dr. Orie Rout  . Pill esophagitis 08/28/2012   amoxicillin  by hx  disc plan nl voice except hoarse not drooling  close fu  if not getting better with plan stop aleve  liquid ibu onlyf necessary   . Recurrent periodic urticaria     No family history on file.  Social History   Social History  . Marital status: Single    Spouse name: N/A  . Number of children: N/A  . Years of education: N/A   Social History Main Topics  . Smoking status: Never Smoker  . Smokeless tobacco: Not on file  . Alcohol use Yes     Comment: occ  . Drug use: No  . Sexual activity: Not on file   Other Topics Concern  . Not on file   Social History Narrative  . No narrative on file    Outpatient Medications Prior to Visit  Medication Sig Dispense Refill  . baclofen (LIORESAL) 10 MG tablet Take 1 tablet by mouth 2 (two) times daily as needed.  0  . celecoxib (CELEBREX) 200 MG capsule  Take 200 mg by mouth 2 (two) times daily as needed for mild pain or moderate pain.     . Certolizumab Pegol (CIMZIA PREFILLED [place]) Inject 400 mg into the skin every 30 (thirty) days.    . Cholecalciferol 50000 units capsule Take 50,000 Units by mouth once a week.    [name] FLUoxetine (PROZAC) 10 MG capsule Take 10 mg by mouth daily.  5  . iron polysaccharides (FERREX 150) 150 MG capsule Take 1 capsule (150 mg total) by mouth daily. 30 capsule 5  . labetalol (NORMODYNE) 200 MG tablet Take 300 mg twice a day can increase to 400 mg twice a day for bp control (Patient taking differently: Take 200 mg by mouth 3 (three) times daily. ) 120 tablet 3  . methocarbamol (ROBAXIN) 500 MG tablet TAKE 1 TABLET BY MOUTH TWICE A DAY AS NEEDED *LIMIT 2 DAYS PER WEEK  0  . temazepam (RESTORIL) 15 MG capsule Take 15 mg by mouth at bedtime as needed for sleep.    [name] zonisamide (ZONEGRAN) 50 MG capsule Take 100 mg by mouth daily.  0   No facility-administered medications  prior to visit.      EXAM:  BP 132/70 (BP Location: Right Arm, Patient Position: Sitting, Cuff Size: Large)   Pulse 86   Temp 98.3 F (36.8 C) (Oral)   Wt 260 lb 4.8 oz (118.1 kg)   SpO2 97%   BMI 44.68 kg/m   Body mass index is 44.68 kg/m.  GENERAL: vitals reviewed and listed above, alert, oriented, appears well hydrated and in no acute distress  mnon toxic but ill.   Very congested  HEENT: atraumatic, conjunctiva  clear, no obvious abnormalities on inspection of external nose and ears tm ok OP : tonsil 2+ left more than right with exudate  Confluent  NECK: no obvious masses on inspection  Tender ac nodes palpation  LUNGS: clear to auscultation bilaterally, no wheezes, rales or rhonchi,  CV: HRRR, no clubbing cyanosis or  peripheral edema nl cap refill  MS: moves all extremities without noticeable focal  abnormality PSYCH: pleasant and cooperative, no obvious depression or anxiety cognition intact  rs positive  ASSESSMENT AND  PLAN:  Discussed the following assessment and plan:  Streptococcal tonsillitis  Fever, unspecified fever cause - Plan: POC Rapid Strep A, POC Influenza A/B  Sore throat - Plan: POC Rapid Strep A, Throat culture  Exposure to the flu  Rheumatoid arthritis involving multiple sites, unspecified rheumatoid factor presence (HCC) Positive strep is with tonsillitis although the fever could be from strep she has had a flulike syndrome with cough and congestion also. With the questionable exposure to flu and her diagnosis of rheumatoid arthritis immunosuppressed we'll add Tamiflu to amoxicillin. Expectant management note for work contact us his fever is not significantly improved in the next 48 hours or as needed. -Patient advised to return or notify health care team  if symptoms worsen ,persist or new concerns arise.  Patient Instructions  You have strep throat tonsillitis. Begin antibiotic amoxicillin as planned. Because you have been exposed to flu and have significant body aches although could be from the fever from strep throat will also treat for early flu. Your fever either way should be resolved in the next 48 hours become very low-grade. No work until fever gone 2 days. Stay hydrated fluids as tolerated.   Strep Throat Strep throat is a bacterial infection of the throat. Your health care provider may call the infection tonsillitis or pharyngitis, depending on whether there is swelling in the tonsils or at the back of the throat. Strep throat is most common during the cold months of the year in children who are 66-107 years of age, but it can happen during any season in people of any age. This infection is spread from person to person (contagious) through coughing, sneezing, or close contact. CAUSES Strep throat is caused by the bacteria called Streptococcus pyogenes. RISK FACTORS This condition is more likely to develop in:  People who spend time in crowded places where the infection can  spread easily.  People who have close contact with someone who has strep throat. SYMPTOMS Symptoms of this condition include:  Fever or chills.   Redness, swelling, or pain in the tonsils or throat.  Pain or difficulty when swallowing.  White or yellow spots on the tonsils or throat.  Swollen, tender glands in the neck or under the jaw.  Red rash all over the body (rare). DIAGNOSIS This condition is diagnosed by performing a rapid strep test or by taking a swab of your throat (throat culture test). Results from a rapid strep test are  usually ready in a few minutes, but throat culture test results are available after one or two days. TREATMENT This condition is treated with antibiotic medicine. HOME CARE INSTRUCTIONS Medicines  Take over-the-counter and prescription medicines only as told by your health care provider.  Take your antibiotic as told by your health care provider. Do not stop taking the antibiotic even if you start to feel better.  Have family members who also have a sore throat or fever tested for strep throat. They may need antibiotics if they have the strep infection. Eating and Drinking  Do not share food, drinking cups, or personal items that could cause the infection to spread to other people.  If swallowing is difficult, try eating soft foods until your sore throat feels better.  Drink enough fluid to keep your urine clear or pale yellow. General Instructions  Gargle with a salt-water mixture 3-4 times per day or as needed. To make a salt-water mixture, completely dissolve -1 tsp of salt in 1 cup of warm water.  Make sure that all household members wash their hands well.  Get plenty of rest.  Stay home from school or work until you have been taking antibiotics for 24 hours.  Keep all follow-up visits as told by your health care provider. This is important. SEEK MEDICAL CARE IF:  The glands in your neck continue to get bigger.  You develop a  rash, cough, or earache.  You cough up a thick liquid that is green, yellow-brown, or bloody.  You have pain or discomfort that does not get better with medicine.  Your problems seem to be getting worse rather than better.  You have a fever. SEEK IMMEDIATE MEDICAL CARE IF:  You have new symptoms, such as vomiting, severe headache, stiff or painful neck, chest pain, or shortness of breath.  You have severe throat pain, drooling, or changes in your voice.  You have swelling of the neck, or the skin on the neck becomes red and tender.  You have signs of dehydration, such as fatigue, dry mouth, and decreased urination.  You become increasingly sleepy, or you cannot wake up completely.  Your joints become red or painful.   This information is not intended to replace advice given to you by your health care provider. Make sure you discuss any questions you have with your health care provider.   Document Released: 09/01/2000 Document Revised: 05/26/2015 Document Reviewed: 12/28/2014 Elsevier Interactive Patient Education 2016 [place] K. Tyronne Blann M.D.

## 2016-07-24 NOTE — Patient Instructions (Signed)
You have strep throat tonsillitis. Begin antibiotic amoxicillin as planned. Because you have been exposed to flu and have significant body aches although could be from the fever from strep throat will also treat for early flu. Your fever either way should be resolved in the next 48 hours become very low-grade. No work until fever gone 2 days. Stay hydrated fluids as tolerated.   Strep Throat Strep throat is a bacterial infection of the throat. Your health care provider may call the infection tonsillitis or pharyngitis, depending on whether there is swelling in the tonsils or at the back of the throat. Strep throat is most common during the cold months of the year in children who are 28-91 years of age, but it can happen during any season in people of any age. This infection is spread from person to person (contagious) through coughing, sneezing, or close contact. CAUSES Strep throat is caused by the bacteria called Streptococcus pyogenes. RISK FACTORS This condition is more likely to develop in:  People who spend time in crowded places where the infection can spread easily.  People who have close contact with someone who has strep throat. SYMPTOMS Symptoms of this condition include:  Fever or chills.   Redness, swelling, or pain in the tonsils or throat.  Pain or difficulty when swallowing.  White or yellow spots on the tonsils or throat.  Swollen, tender glands in the neck or under the jaw.  Red rash all over the body (rare). DIAGNOSIS This condition is diagnosed by performing a rapid strep test or by taking a swab of your throat (throat culture test). Results from a rapid strep test are usually ready in a few minutes, but throat culture test results are available after one or two days. TREATMENT This condition is treated with antibiotic medicine. HOME CARE INSTRUCTIONS Medicines  Take over-the-counter and prescription medicines only as told by your health care  provider.  Take your antibiotic as told by your health care provider. Do not stop taking the antibiotic even if you start to feel better.  Have family members who also have a sore throat or fever tested for strep throat. They may need antibiotics if they have the strep infection. Eating and Drinking  Do not share food, drinking cups, or personal items that could cause the infection to spread to other people.  If swallowing is difficult, try eating soft foods until your sore throat feels better.  Drink enough fluid to keep your urine clear or pale yellow. General Instructions  Gargle with a salt-water mixture 3-4 times per day or as needed. To make a salt-water mixture, completely dissolve -1 tsp of salt in 1 cup of warm water.  Make sure that all household members wash their hands well.  Get plenty of rest.  Stay home from school or work until you have been taking antibiotics for 24 hours.  Keep all follow-up visits as told by your health care provider. This is important. SEEK MEDICAL CARE IF:  The glands in your neck continue to get bigger.  You develop a rash, cough, or earache.  You cough up a thick liquid that is green, yellow-brown, or bloody.  You have pain or discomfort that does not get better with medicine.  Your problems seem to be getting worse rather than better.  You have a fever. SEEK IMMEDIATE MEDICAL CARE IF:  You have new symptoms, such as vomiting, severe headache, stiff or painful neck, chest pain, or shortness of breath.  You have severe  throat pain, drooling, or changes in your voice.  You have swelling of the neck, or the skin on the neck becomes red and tender.  You have signs of dehydration, such as fatigue, dry mouth, and decreased urination.  You become increasingly sleepy, or you cannot wake up completely.  Your joints become red or painful.   This information is not intended to replace advice given to you by your health care provider.  Make sure you discuss any questions you have with your health care provider.   Document Released: 09/01/2000 Document Revised: 05/26/2015 Document Reviewed: 12/28/2014 Elsevier Interactive Patient Education [employer].

## 2016-07-26 ENCOUNTER — Telehealth: Payer: Self-pay | Admitting: Internal Medicine

## 2016-07-26 NOTE — Telephone Encounter (Signed)
Left a message for a return call.  Pt needs to be scheduled.

## 2016-07-26 NOTE — Telephone Encounter (Signed)
Ov tomorrow  And can cancel if doing better   Make sure taking antibiotic   Tid

## 2016-07-26 NOTE — Progress Notes (Signed)
Pre visit review using our clinic review tool, if applicable. No additional management support is needed unless otherwise documented below in the visit note.  Chief Complaint  Patient presents with  . Follow-up    Pt is not able to swallow her bp medication.    HPI: Jennifer [name] 40 y.o. comes in today as planned see phone note from yesterday having a hard time swallowing pills. Still hurting. She is on Tamiflu and amoxicillin. Her fever is now gone but she still has some body aches. Cough is minimal to none have a lot of head congestion. Never had mono. No vomiting having a hard time getting her blood pressure medicine down her throat because of the pain and throat infection. Didn't take medicine this morning some yesterday. Blood pressure is up she believes because of that. ROS: See pertinent positives and negatives per HPI. No cp sob  Rash bleeding  Past Medical History:  Diagnosis Date  . Acute otitis media 08/28/2012   improved  change to liquid medication   . Breast abscess of female    Recurrent  . Fibroids    w/bleeding  . Headache(784.0)   . Hypertension   . Migraines    Dr. Orie Rout  . Pill esophagitis 08/28/2012   amoxicillin  by hx  disc plan nl voice except hoarse not drooling  close fu  if not getting better with plan stop aleve  liquid ibu onlyf necessary   . Recurrent periodic urticaria     No family history on file.  Social History   Social History  . Marital status: Single    Spouse name: N/A  . Number of children: N/A  . Years of education: N/A   Social History Main Topics  . Smoking status: Never Smoker  . Smokeless tobacco: None  . Alcohol use Yes     Comment: occ  . Drug use: No  . Sexual activity: Not Asked   Other Topics Concern  . None   Social History Narrative  . None    Outpatient Medications Prior to Visit  Medication Sig Dispense Refill  . amoxicillin (AMOXIL) 500 MG capsule Take 1 capsule (500 mg total) by mouth 3  (three) times daily. 30 capsule 0  . baclofen (LIORESAL) 10 MG tablet Take 1 tablet by mouth 2 (two) times daily as needed.  0  . celecoxib (CELEBREX) 200 MG capsule Take 200 mg by mouth 2 (two) times daily as needed for mild pain or moderate pain.     . Certolizumab Pegol (CIMZIA PREFILLED [place]) Inject 400 mg into the skin every 30 (thirty) days.    . Cholecalciferol 50000 units capsule Take 50,000 Units by mouth once a week.    [name] FLUoxetine (PROZAC) 10 MG capsule Take 10 mg by mouth daily.  5  . iron polysaccharides (FERREX 150) 150 MG capsule Take 1 capsule (150 mg total) by mouth daily. 30 capsule 5  . labetalol (NORMODYNE) 200 MG tablet Take 300 mg twice a day can increase to 400 mg twice a day for bp control (Patient taking differently: Take 200 mg by mouth 3 (three) times daily. ) 120 tablet 3  . methocarbamol (ROBAXIN) 500 MG tablet TAKE 1 TABLET BY MOUTH TWICE A DAY AS NEEDED *LIMIT 2 DAYS PER WEEK  0  . oseltamivir (TAMIFLU) 75 MG capsule Take 1 capsule (75 mg total) by mouth 2 (two) times daily. 10 capsule 0  . temazepam (RESTORIL) 15 MG capsule Take 15 mg by  mouth at bedtime as needed for sleep.    [name] zonisamide (ZONEGRAN) 50 MG capsule Take 100 mg by mouth daily.  0   No facility-administered medications prior to visit.      EXAM:  BP (!) 162/96 (BP Location: Right Arm, Patient Position: Sitting, Cuff Size: Large)   Temp 97.2 F (36.2 C) (Temporal)   Wt 257 lb 6.4 oz (116.8 kg)   BMI 44.18 kg/m   Body mass index is 44.18 kg/m.  GENERAL: vitals reviewed and listed above, alert, oriented, appears well hydrated and in no acute distressSounds hoarse and congested no cough. No ntoxic mild illness  Looking  No stridor or rep distress  HEENT: atraumatic, conjunctiva  clear, no obvious abnormalities on inspection of external nose and ears right TM normal left view partly blocked by cerumen but gray OP : Tonsils +2 to +3 right larger than left but airway intact no significant edema  some mild palatal redness on the right but no bulging. The exudate is gone from previous exam. NECK: no obvious masses on inspectionmild tender ac nodes  On  palpation  LUNGS: clear to auscultation bilaterally, no wheezes, rales or rhonchi, good air movement CV: HRRR, no clubbing cyanosis or  peripheral edema nl cap refill  MS: moves all extremities without noticeable focal  abnormality PSYCH: pleasant and cooperative, no obvious depression or anxiety Wt Readings from Last 3 Encounters:  07/27/16 257 lb 6.4 oz (116.8 kg)  07/24/16 260 lb 4.8 oz (118.1 kg)  06/15/16 261 lb (118.4 kg)    ASSESSMENT AND PLAN:  Discussed the following assessment and plan:  Streptococcal tonsillitis - improved but sill causing  sig sx  no fever boost with rocephin con tamiflu gargles and meds  - Plan: cefTRIAXone (ROCEPHIN) injection 1 g  Dysphagia, unspecified type - ty crusing in applesauce  bp meds etc  no alrm features  although right more than left  inflammation no obv abscess   Essential hypertension - inc readings cause of not taking med for a day cause of difficulty swallowing  Congestion reminiscent of adenoiditis    -Patient advised to return or notify health care team  if symptoms worsen ,persist or new concerns arise.  Patient Instructions  Injection of antibiotic today to boost the level and help your throat. Can resume the amoxicillin tonight or tomorrow morning. Ask your pharmacist but I believe you can crush your blood pressure medicine and take in applesauce or other .   Stay on the tamiflu   Contact us if  Not improving   After weekend Or getting worse   Cant try 3 nights of generic afrin nose spray  And saline for congestion    Mariann Laster K. Zachory Mangual M.D.

## 2016-07-26 NOTE — Telephone Encounter (Signed)
Spoke to the pt and scheduled her for 07/27/16 @ 9:15 AM.  Advised she visit the emergency room if she feels like her throat is swelling more and having trouble breathing.  Pt agreed.

## 2016-07-26 NOTE — Telephone Encounter (Signed)
Pt wants to let Dr Regis Bill know her throat is worse than it was Monday.  It feels swollen inside.

## 2016-07-26 NOTE — Telephone Encounter (Signed)
Spoke to the pt.  She stated her throat feels worse.  Feels more swollen.  She is having to breath through her mouth.  Cannot breath through her nose.  She states she did try a salt water gargle but didn't really help.  Advised soft and comforting food, warm liquids, honey, warm broth and maybe an ice pop.  Informed her it may take up to 3 days for antibiotics to be fully effective.  She is at work but going home at 2.  Notified her I will send the message to [facility] and will call back if there are any other instructions.

## 2016-07-27 ENCOUNTER — Ambulatory Visit (INDEPENDENT_AMBULATORY_CARE_PROVIDER_SITE_OTHER): Payer: Commercial Managed Care - PPO | Admitting: Internal Medicine

## 2016-07-27 ENCOUNTER — Encounter: Payer: Self-pay | Admitting: Internal Medicine

## 2016-07-27 VITALS — BP 162/96 | Temp 97.2°F | Wt 257.4 lb

## 2016-07-27 DIAGNOSIS — R1310 Dysphagia, unspecified: Secondary | ICD-10-CM

## 2016-07-27 DIAGNOSIS — I10 Essential (primary) hypertension: Secondary | ICD-10-CM | POA: Diagnosis not present

## 2016-07-27 DIAGNOSIS — J0300 Acute streptococcal tonsillitis, unspecified: Secondary | ICD-10-CM

## 2016-07-27 MED ORDER — CEFTRIAXONE SODIUM 1 G IJ SOLR
1.0000 g | Freq: Once | INTRAMUSCULAR | Status: AC
  Administered 2016-07-27: 1 g via INTRAMUSCULAR

## 2016-07-27 NOTE — Patient Instructions (Addendum)
Injection of antibiotic today to boost the level and help your throat. Can resume the amoxicillin tonight or tomorrow morning. Ask your pharmacist but I believe you can crush your blood pressure medicine and take in applesauce or other .   Stay on the tamiflu   Contact us if  Not improving   After weekend Or getting worse   Cant try 3 nights of generic afrin nose spray  And saline for congestion

## 2016-07-28 IMAGING — MR MR PELVIS WO/W CM
6 of 13 series · 19 of 48 positions shown · IV contrast (multihance)
Comparison: None.

CLINICAL DATA: Uterine fibroids, pre [place]

EXAM:
MRI PELVIS WITHOUT AND WITH CONTRAST
TECHNIQUE: Multiplanar multisequence MR imaging of the pelvis was performed
both before and after administration of intravenous contrast.
Creatinine was obtained on site at [HOSPITAL] at [HOSPITAL].
Results: Creatinine 1.0 mg/dL.
CONTRAST:  20 mL Multihance IV

[Series 3: T2 · coronal · 6.0mm · 0.82mm/px · 4 of 33 slices shown]
[im 1/33]
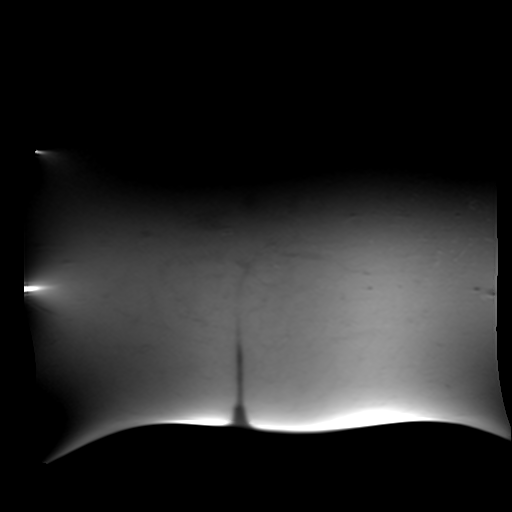
[im 11/33]
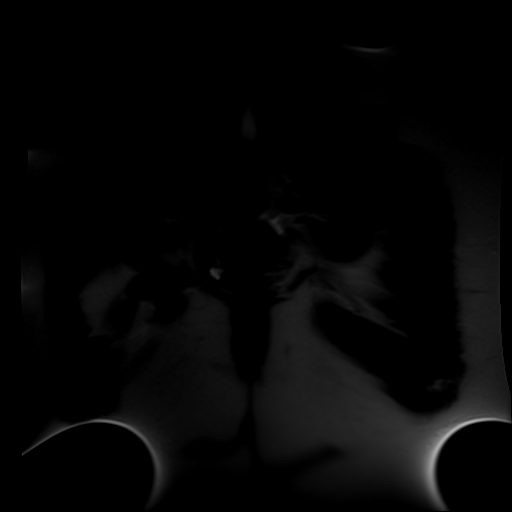
[im 22/33]
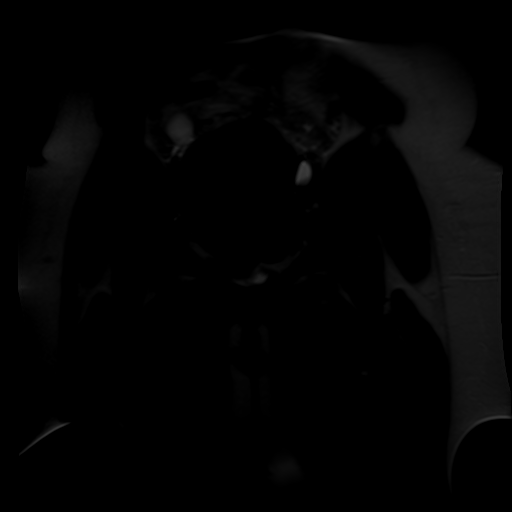
[im 33/33]
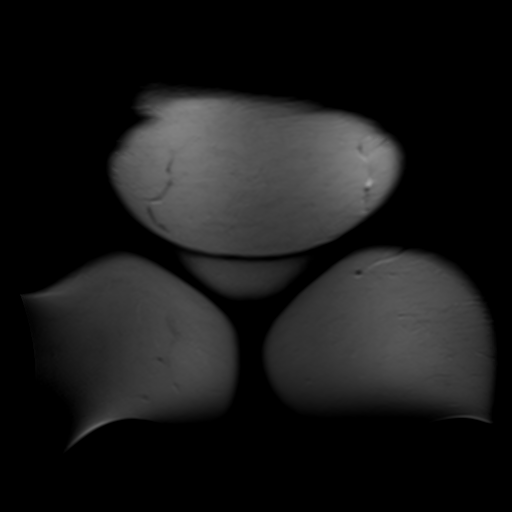

[Series 4: t2_tse_sag · sagittal · 5.0mm · 0.59mm/px · 3 of 27 slices shown]
[im 1/27]
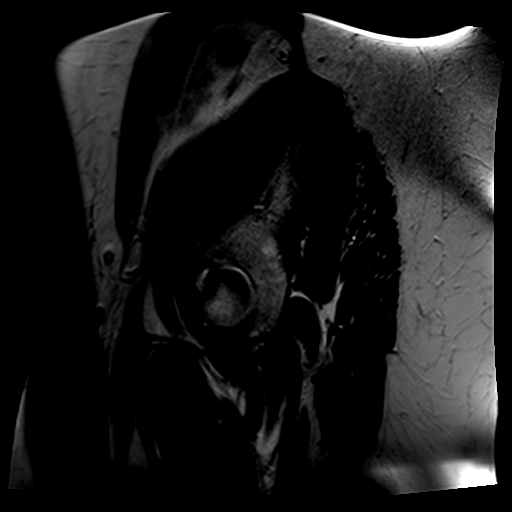
[im 14/27]
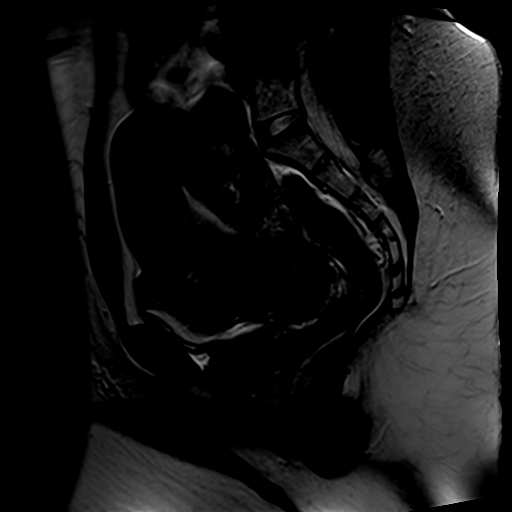
[im 27/27]
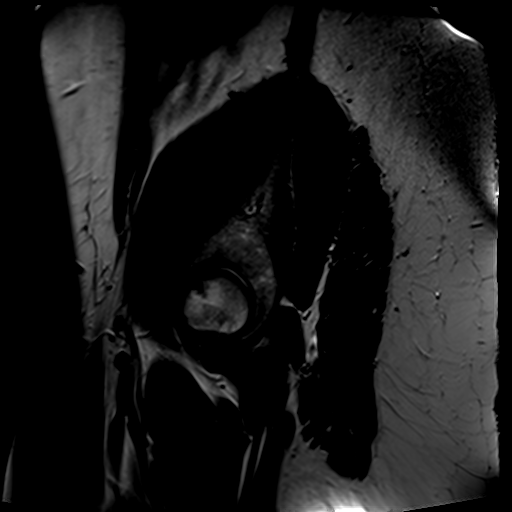

[Series 5: T1 · axial · 6.0mm · 0.55mm/px · z∈[-113,+139]mm · 3 of 36 slices shown]
[im 1/36]
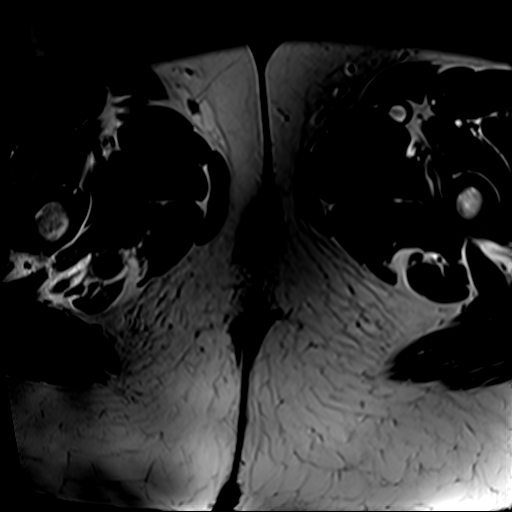
[im 18/36]
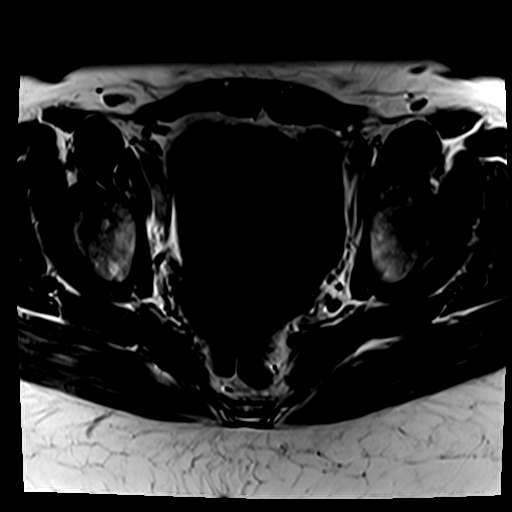
[im 36/36]
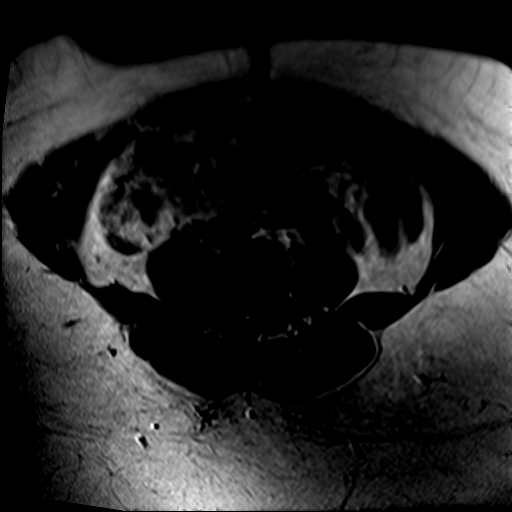

[Series 6: t2_tse axial · axial · 6.0mm · 0.55mm/px · z∈[-113,+139]mm · 3 of 36 slices shown]
[im 1/36]
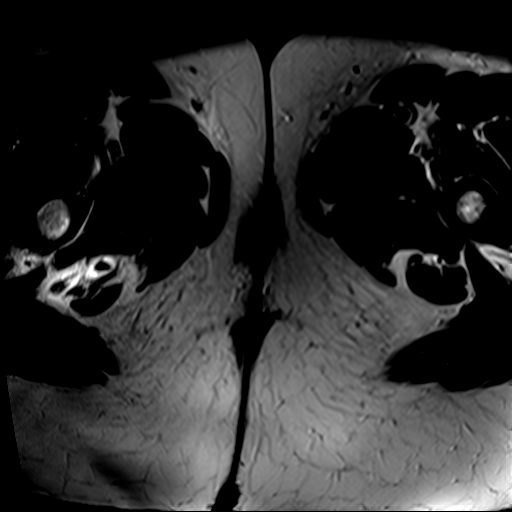
[im 18/36]
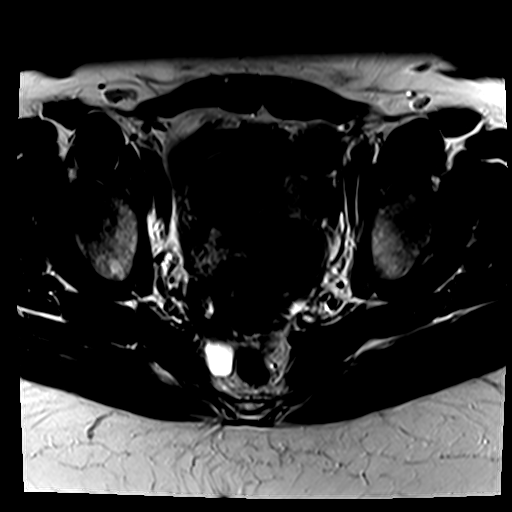
[im 36/36]
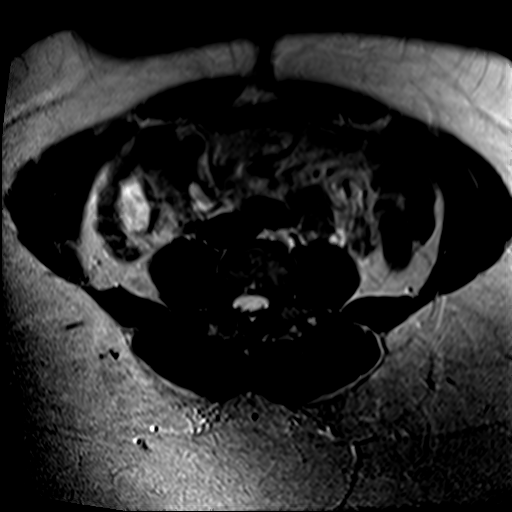

[Series 7: t2_tse axial fs · axial · 6.0mm · 0.55mm/px · z∈[-113,+139]mm · 3 of 36 slices shown]
[im 1/36]
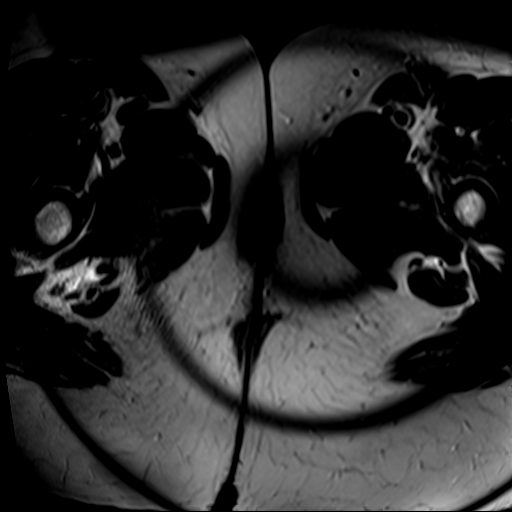
[im 18/36]
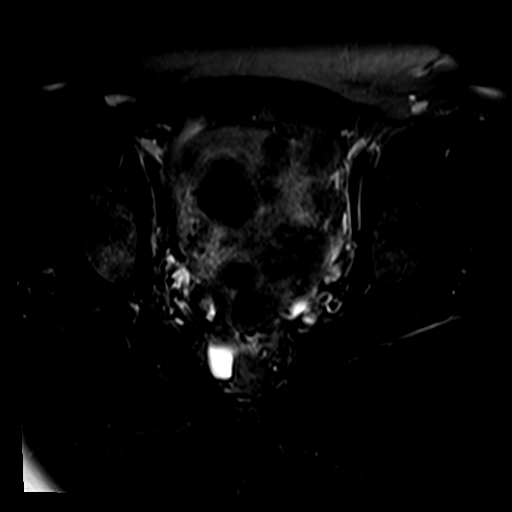
[im 36/36]
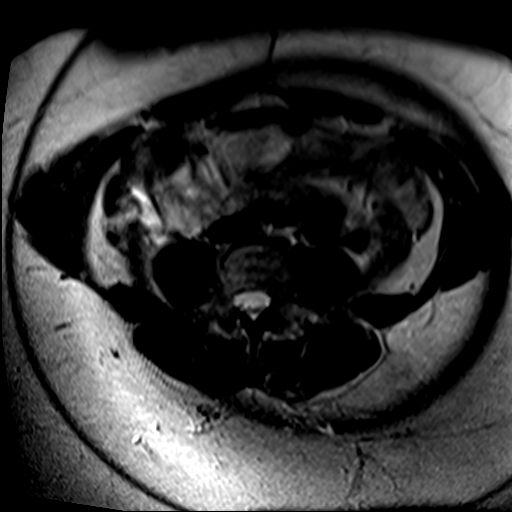

[Series 8: axial spgr · axial · 6.0mm · 0.55mm/px · z∈[-113,+139]mm · 3 of 36 slices shown]
[im 1/36]
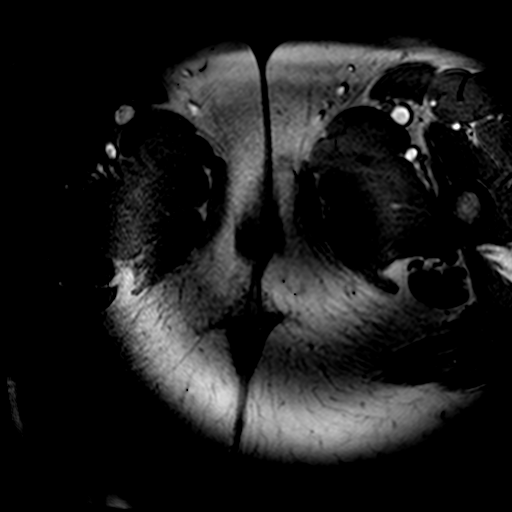
[im 18/36]
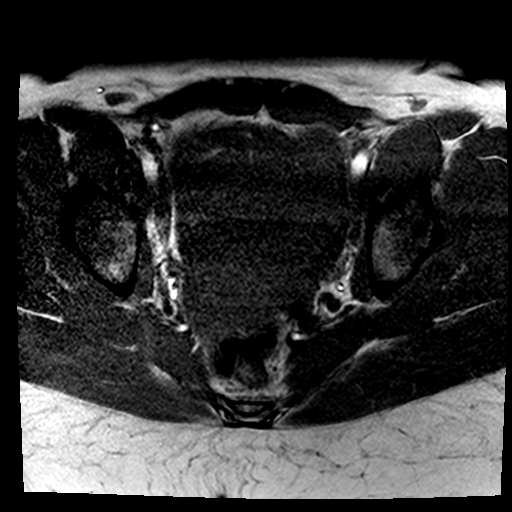
[im 36/36]
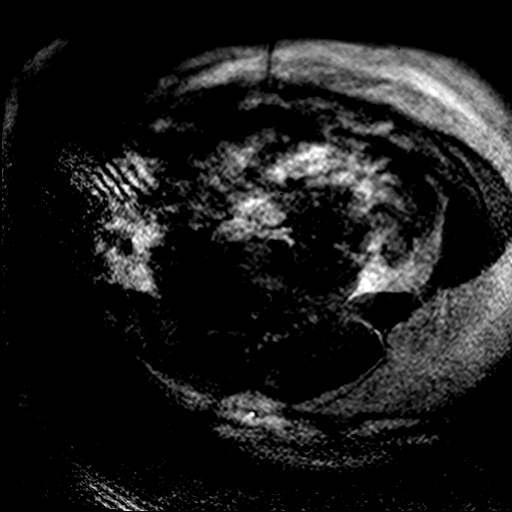

[19 of 48 positions shown; findings below may reference images not displayed]

FINDINGS: Uterus measures 9.9 x 10.9 x 13.7 cm. Endometrial complex measures 9
mm (series 14/image 15).

Numerous uterine fibroids, approximately 25-30 in number, including:

--4.9 x 5.6 cm subserosal anterior fundal fibroid (series 6/image
10)

--5.0 x 3.8 cm subserosal right lateral fundal fibroid (series
6/image 15)

--3.9 x 3.3 cm submucosal anterior right uterine body fibroid
(series 6/image 19)

All fibroids uniformly enhance following contrast administration.

Left ovary measures 3.1 x 2.5 cm (series 6/ image 12), within normal
limits.

Right ovary measures 3.5 x 4.1 cm (series 6/ image 7), and is
notable for a dominant 2.8 cm corpus luteal cyst, physiologic.

Trace pelvic ascites (series 6/ image 18), physiologic.

No suspicious pelvic lymphadenopathy.

No focal osseous lesions.
IMPRESSION: Numerous uterine fibroids, with representative fibroids as above.

## 2016-07-31 ENCOUNTER — Telehealth: Payer: Self-pay | Admitting: Rheumatology

## 2016-07-31 NOTE — Telephone Encounter (Signed)
[name] called and the patient's Jennifer [name] will be delivered to the office on Thursday 08/03/16. Just an [place]

## 2016-08-01 NOTE — Progress Notes (Signed)
Office Visit Note  Patient: Jennifer [name]             Date of Birth: 03/23/1976           MRN: [id number]             PCP: Lottie Dawson, MD Referring: Burnis Medin, MD Visit Date: 08/02/2016 Occupation: _0 @    Subjective:  Follow-up Follow-up on rheumatoid arthritis high-risk prescription and osteoarthritis of the knees and hands.  History of Present Illness: Jennifer [name] is a 40 y.o. female  Who was last seen in our office on 02/24/2016 for rheumatoid arthritis and high risk prescription. Patient has been on Cimzia for significant amount of time and she is responding well to the treatment. Note that at one time her insurance company denied Cimzia but we wrote a letter supporting the patient's need for Cimzia and it was finally approved after on appeal. Her osteoarthritis of the knee joint is being well addressed with Hyalgan injections. Approximately summer of 2017 she had another series of Hyalgan.  Today, the patients states: That she was diagnosed with strep throat last Monday. She is now feeling a little bit better but she still is under the weather. She was treated with amoxicillin for her strep and she also was given Tamiflu. Patient feels that she is close to 90% better at this time  Patient is doing well with her Cimzia overall. She continues to have good response. However she is due for the next Cimzia injection in a few days and it is out of her system and so she does have a little bit of joint pain.  She is scheduled for fibroid surgery approximately 09/06/2016. Patient is seeing a fertility specialist was currently doing her fibroid surgery we discussed the importance of being off of the Cimzia at the appropriate time to allow her immune system the ability to fight off any anginal infection and recover from the surgery. She wants to use Celebrex for the breakthrough pain that she might have and I discussed that she should discuss that with her surgeon     Activities of Daily Living:  Patient reports morning stiffness for 15 minutes.   Patient Denies nocturnal pain.  Difficulty dressing/grooming: Denies Difficulty climbing stairs: Denies Difficulty getting out of chair: Denies Difficulty using hands for taps, buttons, cutlery, and/or writing: Denies   Review of Systems  Constitutional: Negative for fatigue.  HENT: Positive for sore throat (IMPROVING after tx w/ amoxil from pcp last week). Negative for mouth sores and mouth dryness.   Eyes: Negative for dryness.  Respiratory: Negative for shortness of breath.   Gastrointestinal: Negative for constipation and diarrhea.  Musculoskeletal: Negative for myalgias and myalgias.  Skin: Negative for sensitivity to sunlight.  Psychiatric/Behavioral: Negative for decreased concentration and sleep disturbance.    PMFS History:  Patient Active Problem List   Diagnosis Date Noted  . Fibroid, uterine   . Essential hypertension 05/25/2015  . Recurrent headache 05/25/2015  . Head lump 07/31/2014  . Edema 12/29/2013  . Baker's cyst, ruptured 12/25/2013  . Cough, persistent 12/12/2013  . Left leg swelling 12/12/2013  . Cough 12/12/2013  . High risk medication use 12/12/2013  . Recurrent periodic urticaria   . Rheumatoid arthritis ([place]) 08/28/2012  . CHRONIC LARYNGITIS 11/03/2010  . ALLERGIC RHINITIS 11/03/2010  . PARESTHESIA 07/28/2010  . DYSMENORRHEA 10/15/2007  . FIBROIDS, UTERUS 07/24/2007  . OBESITY 07/24/2007  . HYPERTENSION 07/24/2007  . SLEEPLESSNESS 07/24/2007  . HEADACHE 07/24/2007  Past Medical History:  Diagnosis Date  . Acute otitis media 08/28/2012   improved  change to liquid medication   . Breast abscess of female    Recurrent  . Fibroids    w/bleeding  . Headache(784.0)   . Hypertension   . Migraines    Dr. Orie Rout  . Pill esophagitis 08/28/2012   amoxicillin  by hx  disc plan nl voice except hoarse not drooling  close fu  if not getting better with  plan stop aleve  liquid ibu onlyf necessary   . Recurrent periodic urticaria   . Rheumatoid arthritis ([place])     Family History  Problem Relation Age of Onset  . Hypertension Mother   . Rheum arthritis Mother   . Hypertension Father    Past Surgical History:  Procedure Laterality Date  . breast abscess  2003   removal   Social History   Social History Narrative  . No narrative on file     Objective: Vital Signs: BP (!) 148/97 (BP Location: Left Arm, Patient Position: Sitting, Cuff Size: Large)   Pulse 78   Resp 14   Ht _0  (1.626 m)   Wt 262 lb (118.8 kg)   LMP 08/01/2016   BMI 44.97 kg/m    Physical Exam  Constitutional: She is oriented to person, place, and time. She appears well-developed and well-nourished.  HENT:  Head: Normocephalic and atraumatic.  Eyes: EOM are normal. Pupils are equal, round, and reactive to light.  Cardiovascular: Normal rate, regular rhythm and normal heart sounds.  Exam reveals no gallop and no friction rub.   No murmur heard. Pulmonary/Chest: Effort normal and breath sounds normal. She has no wheezes. She has no rales.  Abdominal: Soft. Bowel sounds are normal. She exhibits no distension. There is no tenderness. There is no guarding. No hernia.  Musculoskeletal: Normal range of motion. She exhibits no edema, tenderness or deformity.  Lymphadenopathy:    She has no cervical adenopathy.  Neurological: She is alert and oriented to person, place, and time. Coordination normal.  Skin: Skin is warm and dry. Capillary refill takes less than 2 seconds. No rash noted.  Psychiatric: She has a normal mood and affect. Her behavior is normal.  Nursing note and vitals reviewed.    Musculoskeletal Exam:  Full range of motion of all joints Grip strength is equal and strong bilaterally Fiber myalgia tender points are all absent   CDAI Exam: CDAI Homunculus Exam:   Joint Counts:  CDAI Tender Joint count: 0 CDAI Swollen Joint count: 0  Global  Assessments:  Patient Global Assessment: 2 Provider Global Assessment: 2  CDAI Calculated Score: 4    Investigation: No additional findings.   Imaging: No results found.  Speciality Comments: No specialty comments available.    Procedures:  No procedures performed Allergies: Patient has no known allergies.   Assessment / Plan: Visit Diagnoses: Rheumatoid arthritis with rheumatoid factor of multiple sites without organ or systems involvement ([place]) - +RF ; +CCP ; +[id number] - Plan: Certolizumab Pegol KIT 400 mg  High risk medications (not anticoagulants) long-term use - CIMZIA 200MG/VIAL X 2 SUBQ EVERY MONTH ; TB GOLD NEGATIVE AUG 2017 ;  - Plan: CBC with Differential/Platelet, COMPLETE METABOLIC PANEL WITH GFR  Osteoarthritis of both knees, unspecified osteoarthritis type  Strep pharyngitis  Influenza  Osteoarthritis of both hands, unspecified osteoarthritis type - MILD DISCOMFORT   Patient will get her Cimzia injection as soon as it arrives in our office either  later this week or early next week. She will get her full dose. It should be out of her system in 4 weeks.  She will able to get her fibroid surgery December 20 at schedule. About a week after surgery if everything goes well and she does not have any infection from the surgery and if her surgeon is agreeable we plan to restart her Cimzia at that time.  I'll do CBC with differential CMP with GFR today  We may consider giving her prednisone taper for surgeon approves if she has a flare  Her TB goal is negative as of August 2017  CBC with differential CMP with GFR is also normal August 2017.  Her knees are doing well with her Hyalgan injections. She is interested in getting repeat Hyalgan injections when they're due.  Patient blood pressure is mildly elevated at 148/97. She has a history of elevated blood pressure. We discussed this on previous visits that well. On 02/24/2016 visit she was 168/105. She does need to  address this with her PCP.  Patient is currently seeking fertility treatment and will be getting fibroid surgery 09/06/2016. We discussed how the immunosuppressant can affect her healing process after the surgery at length. She understands to hold off on taking her Cimzia next month and she can start it up week after her surgery if her surgeon clears her.  Return to clinic in 5 months  xamination Patient currently with strep throat and flu and is 90% better after suffering for approximately 7-10 days. I believe her Global assessment of 2 is related to her current illness. Her rheumatoid arthritis is well controlled with Cimzia. She takes 200 mg per vial 2 subcutaneously once a month. She is due for injections today.  Orders: Orders Placed This Encounter  Procedures  . CBC with Differential/Platelet  . COMPLETE METABOLIC PANEL WITH GFR   Meds ordered this encounter  Medications  . Certolizumab Pegol KIT 400 mg    Face-to-face time spent with patient was 30 minutes. 50% of time was spent in counseling and coordination of care.  Follow-Up Instructions: Return in about 5 months (around 12/31/2016) for RA, [place], OA KJ, s/p fibroid surgery.   Eliezer Lofts, PA-C   I examined and evaluated the patient with Eliezer Lofts PA. The plan of care was discussed as noted above.  Bo Merino, MD

## 2016-08-02 ENCOUNTER — Ambulatory Visit (INDEPENDENT_AMBULATORY_CARE_PROVIDER_SITE_OTHER): Payer: Commercial Managed Care - PPO | Admitting: Rheumatology

## 2016-08-02 ENCOUNTER — Encounter: Payer: Self-pay | Admitting: Rheumatology

## 2016-08-02 VITALS — BP 148/97 | HR 78 | Resp 14 | Ht 64.0 in | Wt 262.0 lb

## 2016-08-02 DIAGNOSIS — J111 Influenza due to unidentified influenza virus with other respiratory manifestations: Secondary | ICD-10-CM

## 2016-08-02 DIAGNOSIS — M0579 Rheumatoid arthritis with rheumatoid factor of multiple sites without organ or systems involvement: Secondary | ICD-10-CM | POA: Diagnosis not present

## 2016-08-02 DIAGNOSIS — M19042 Primary osteoarthritis, left hand: Secondary | ICD-10-CM

## 2016-08-02 DIAGNOSIS — M19041 Primary osteoarthritis, right hand: Secondary | ICD-10-CM | POA: Diagnosis not present

## 2016-08-02 DIAGNOSIS — Z79899 Other long term (current) drug therapy: Secondary | ICD-10-CM | POA: Diagnosis not present

## 2016-08-02 DIAGNOSIS — M170 Bilateral primary osteoarthritis of knee: Secondary | ICD-10-CM

## 2016-08-02 DIAGNOSIS — J020 Streptococcal pharyngitis: Secondary | ICD-10-CM

## 2016-08-02 LAB — CBC WITH DIFFERENTIAL/PLATELET
BASOS ABS: 0 {cells}/uL (ref 0–200)
Basophils Relative: 0 %
Eosinophils Absolute: 210 cells/uL (ref 15–500)
Eosinophils Relative: 3 %
HEMATOCRIT: 35.8 % (ref 35.0–45.0)
HEMOGLOBIN: 11.7 g/dL (ref 11.7–15.5)
LYMPHS ABS: 2520 {cells}/uL (ref 850–3900)
Lymphocytes Relative: 36 %
MCH: 30 pg (ref 27.0–33.0)
MCHC: 32.7 g/dL (ref 32.0–36.0)
MCV: 91.8 fL (ref 80.0–100.0)
MONO ABS: 770 {cells}/uL (ref 200–950)
MPV: 9.2 fL (ref 7.5–12.5)
Monocytes Relative: 11 %
NEUTROS PCT: 50 %
Neutro Abs: 3500 cells/uL (ref 1500–7800)
Platelets: 388 10*3/uL (ref 140–400)
RBC: 3.9 MIL/uL (ref 3.80–5.10)
RDW: 13.5 % (ref 11.0–15.0)
WBC: 7 10*3/uL (ref 3.8–10.8)

## 2016-08-02 MED ORDER — CERTOLIZUMAB PEGOL 2 X 200 MG/ML ~~LOC~~ KIT
400.0000 mg | PACK | Freq: Once | SUBCUTANEOUS | Status: AC
  Administered 2016-08-02: 400 mg via SUBCUTANEOUS

## 2016-08-02 NOTE — Progress Notes (Signed)
Medication Samples have been provided to the patient.  Drug name: Cimzia       Strength: 200mg       Qty: 2 syringes/one box  LOT: [id number]  Exp.Date: 09/2017  Dosing instructions: two syringes sub q bilateral upper thighs.   The patient has been instructed regarding the correct time, dose, and frequency of taking this medication, including desired effects and most common side effects.   Amy Littrell 9:30 AM 08/02/2016

## 2016-08-03 LAB — COMPLETE METABOLIC PANEL WITH GFR
ALBUMIN: 3.4 g/dL — AB (ref 3.6–5.1)
ALK PHOS: 51 U/L (ref 33–115)
ALT: 10 U/L (ref 6–29)
AST: 15 U/L (ref 10–30)
BILIRUBIN TOTAL: 0.5 mg/dL (ref 0.2–1.2)
BUN: 8 mg/dL (ref 7–25)
CALCIUM: 8.7 mg/dL (ref 8.6–10.2)
CO2: 27 mmol/L (ref 20–31)
CREATININE: 0.93 mg/dL (ref 0.50–1.10)
Chloride: 104 mmol/L (ref 98–110)
GFR, Est African American: 88 mL/min (ref >=60)
GFR, Est Non African American: 77 mL/min (ref >=60)
Glucose, Bld: 90 mg/dL (ref 65–99)
POTASSIUM: 4.1 mmol/L (ref 3.5–5.3)
Sodium: 137 mmol/L (ref 135–146)
TOTAL PROTEIN: 7 g/dL (ref 6.1–8.1)

## 2016-08-03 NOTE — Progress Notes (Signed)
#  1) CMP with GFR is normal except for albumin low at 3.4 which is close to normal range we can monitor.  #2 CBC with differential is normalNo change in treatment.

## 2016-08-31 NOTE — Patient Instructions (Addendum)
Your procedure is scheduled on:  Wednesday, Dec. 20, 2017  Enter through the [employer] [facility] at:  9:15 AM  Pick up the phone at the desk and dial [phone].  Call this number if you have problems the morning of surgery: [phone].  Remember: Do NOT eat food or drink after:  Midnight Tuesday  Take these medicines the morning of surgery with a SIP OF WATER:  Labetalol, Zonisamide  Stop ALL herbal medications at this time   Do NOT wear jewelry (body piercing), metal hair clips/bobby pins, make-up, or nail polish. Do NOT wear lotions, powders, or perfumes.  You may wear deodorant. Do NOT shave for 48 hours prior to surgery. Do NOT bring valuables to the hospital. Contacts, dentures, or bridgework may not be worn into surgery.  Leave suitcase in car.  After surgery it may be brought to your room.  For patients admitted to the hospital, checkout time is 11:00 AM the day of discharge.

## 2016-09-01 ENCOUNTER — Encounter (HOSPITAL_COMMUNITY): Payer: Self-pay

## 2016-09-01 ENCOUNTER — Encounter (HOSPITAL_COMMUNITY)
Admission: RE | Admit: 2016-09-01 | Discharge: 2016-09-01 | Disposition: A | Discharge status: Home / Self Care | Payer: Commercial Managed Care - PPO | Source: Ambulatory Visit | Attending: Obstetrics and Gynecology | Admitting: Obstetrics and Gynecology

## 2016-09-01 DIAGNOSIS — M069 Rheumatoid arthritis, unspecified: Secondary | ICD-10-CM | POA: Insufficient documentation

## 2016-09-01 DIAGNOSIS — Z01812 Encounter for preprocedural laboratory examination: Secondary | ICD-10-CM | POA: Insufficient documentation

## 2016-09-01 LAB — CBC
HCT: 38 % (ref 36.0–46.0)
Hemoglobin: 12.9 g/dL (ref 12.0–15.0)
MCH: 30.6 pg (ref 26.0–34.0)
MCHC: 33.9 g/dL (ref 30.0–36.0)
MCV: 90.3 fL (ref 78.0–100.0)
Platelets: 267 10*3/uL (ref 150–400)
RBC: 4.21 MIL/uL (ref 3.87–5.11)
RDW: 13.5 % (ref 11.5–15.5)
WBC: 4.9 10*3/uL (ref 4.0–10.5)

## 2016-09-01 LAB — BASIC METABOLIC PANEL
ANION GAP: 7 (ref 5–15)
BUN: 12 mg/dL (ref 6–20)
CALCIUM: 8.7 mg/dL — AB (ref 8.9–10.3)
CO2: 25 mmol/L (ref 22–32)
Chloride: 105 mmol/L (ref 101–111)
Creatinine, Ser: 0.97 mg/dL (ref 0.44–1.00)
GFR calc Af Amer: >60 mL/min (ref >60)
GLUCOSE: 101 mg/dL — AB (ref 65–99)
POTASSIUM: 4 mmol/L (ref 3.5–5.1)
SODIUM: 137 mmol/L (ref 135–145)

## 2016-09-01 LAB — ABO/RH: ABO/RH(D): B POS

## 2016-09-01 NOTE — Pre-Procedure Instructions (Signed)
Jennifer [name] was currently having a migraine headache during her pre op appointment.

## 2016-09-06 ENCOUNTER — Encounter (HOSPITAL_COMMUNITY)
Admission: AD | Disposition: A | Discharge status: Home / Self Care | Payer: Self-pay | Source: Ambulatory Visit | Attending: Obstetrics and Gynecology

## 2016-09-06 ENCOUNTER — Ambulatory Visit (HOSPITAL_COMMUNITY): Payer: Commercial Managed Care - PPO | Admitting: Anesthesiology

## 2016-09-06 ENCOUNTER — Encounter (HOSPITAL_COMMUNITY): Payer: Self-pay

## 2016-09-06 ENCOUNTER — Inpatient Hospital Stay (HOSPITAL_COMMUNITY)
Admission: AD | Admit: 2016-09-06 | Discharge: 2016-09-10 | DRG: 742 | Disposition: A | Discharge status: Home / Self Care | Payer: Commercial Managed Care - PPO | Source: Ambulatory Visit | Attending: Obstetrics and Gynecology | Admitting: Obstetrics and Gynecology

## 2016-09-06 DIAGNOSIS — W010XXA Fall on same level from slipping, tripping and stumbling without subsequent striking against object, initial encounter: Secondary | ICD-10-CM | POA: Diagnosis not present

## 2016-09-06 DIAGNOSIS — D250 Submucous leiomyoma of uterus: Secondary | ICD-10-CM | POA: Diagnosis present

## 2016-09-06 DIAGNOSIS — N920 Excessive and frequent menstruation with regular cycle: Secondary | ICD-10-CM | POA: Diagnosis present

## 2016-09-06 DIAGNOSIS — D62 Acute posthemorrhagic anemia: Secondary | ICD-10-CM | POA: Diagnosis not present

## 2016-09-06 DIAGNOSIS — I10 Essential (primary) hypertension: Secondary | ICD-10-CM | POA: Diagnosis present

## 2016-09-06 DIAGNOSIS — D251 Intramural leiomyoma of uterus: Principal | ICD-10-CM | POA: Diagnosis present

## 2016-09-06 DIAGNOSIS — N939 Abnormal uterine and vaginal bleeding, unspecified: Secondary | ICD-10-CM | POA: Diagnosis present

## 2016-09-06 DIAGNOSIS — D259 Leiomyoma of uterus, unspecified: Secondary | ICD-10-CM | POA: Diagnosis present

## 2016-09-06 DIAGNOSIS — Y92230 Patient room in hospital as the place of occurrence of the external cause: Secondary | ICD-10-CM | POA: Diagnosis not present

## 2016-09-06 DIAGNOSIS — Z6841 Body Mass Index (BMI) 40.0 and over, adult: Secondary | ICD-10-CM

## 2016-09-06 DIAGNOSIS — D252 Subserosal leiomyoma of uterus: Secondary | ICD-10-CM | POA: Diagnosis present

## 2016-09-06 HISTORY — PX: MYOMECTOMY: SHX85

## 2016-09-06 LAB — CBC
HEMATOCRIT: 33.6 % — AB (ref 36.0–46.0)
HEMOGLOBIN: 11.4 g/dL — AB (ref 12.0–15.0)
MCH: 30.3 pg (ref 26.0–34.0)
MCHC: 33.9 g/dL (ref 30.0–36.0)
MCV: 89.4 fL (ref 78.0–100.0)
Platelets: 235 10*3/uL (ref 150–400)
RBC: 3.76 MIL/uL — ABNORMAL LOW (ref 3.87–5.11)
RDW: 13.3 % (ref 11.5–15.5)
WBC: 18 10*3/uL — AB (ref 4.0–10.5)

## 2016-09-06 LAB — BASIC METABOLIC PANEL
Anion gap: 6 (ref 5–15)
BUN: 11 mg/dL (ref 6–20)
CHLORIDE: 101 mmol/L (ref 101–111)
CO2: 24 mmol/L (ref 22–32)
Calcium: 8.3 mg/dL — ABNORMAL LOW (ref 8.9–10.3)
Creatinine, Ser: 0.94 mg/dL (ref 0.44–1.00)
GFR calc non Af Amer: >60 mL/min (ref >60)
GLUCOSE: 163 mg/dL — AB (ref 65–99)
POTASSIUM: 4.2 mmol/L (ref 3.5–5.1)
Sodium: 131 mmol/L — ABNORMAL LOW (ref 135–145)

## 2016-09-06 LAB — PREGNANCY, URINE: Preg Test, Ur: NEGATIVE

## 2016-09-06 SURGERY — MYOMECTOMY, ABDOMINAL APPROACH
Anesthesia: General | Site: Abdomen

## 2016-09-06 MED ORDER — HYDROMORPHONE HCL 1 MG/ML IJ SOLN
0.2500 mg | INTRAMUSCULAR | Status: DC | PRN
  Administered 2016-09-06: 0.25 mg via INTRAVENOUS
  Administered 2016-09-06: 0.5 mg via INTRAVENOUS
  Administered 2016-09-06 (×2): 0.25 mg via INTRAVENOUS

## 2016-09-06 MED ORDER — ONDANSETRON HCL 4 MG/2ML IJ SOLN
4.0000 mg | Freq: Four times a day (QID) | INTRAMUSCULAR | Status: DC | PRN
  Administered 2016-09-06: 4 mg via INTRAVENOUS
  Filled 2016-09-06 (×2): qty 2

## 2016-09-06 MED ORDER — ROCURONIUM BROMIDE 100 MG/10ML IV SOLN
INTRAVENOUS | Status: DC | PRN
  Administered 2016-09-06 (×2): 10 mg via INTRAVENOUS
  Administered 2016-09-06: 50 mg via INTRAVENOUS
  Administered 2016-09-06: 10 mg via INTRAVENOUS

## 2016-09-06 MED ORDER — MIDAZOLAM HCL 2 MG/2ML IJ SOLN
INTRAMUSCULAR | Status: AC
  Filled 2016-09-06: qty 2

## 2016-09-06 MED ORDER — FENTANYL CITRATE (PF) 250 MCG/5ML IJ SOLN
INTRAMUSCULAR | Status: AC
  Filled 2016-09-06: qty 5

## 2016-09-06 MED ORDER — ONDANSETRON HCL 4 MG/2ML IJ SOLN
INTRAMUSCULAR | Status: DC | PRN
  Administered 2016-09-06: 4 mg via INTRAVENOUS

## 2016-09-06 MED ORDER — FENTANYL CITRATE (PF) 250 MCG/5ML IJ SOLN
INTRAMUSCULAR | Status: DC | PRN
Start: 2016-09-06 — End: 2016-09-06
  Administered 2016-09-06 (×2): 100 ug via INTRAVENOUS
  Administered 2016-09-06: 150 ug via INTRAVENOUS
  Administered 2016-09-06 (×2): 100 ug via INTRAVENOUS
  Administered 2016-09-06: 50 ug via INTRAVENOUS

## 2016-09-06 MED ORDER — SCOPOLAMINE 1 MG/3DAYS TD PT72
MEDICATED_PATCH | TRANSDERMAL | Status: AC
  Administered 2016-09-06: 1.5 mg via TRANSDERMAL
  Filled 2016-09-06: qty 1

## 2016-09-06 MED ORDER — HYDROMORPHONE HCL 1 MG/ML IJ SOLN
0.2000 mg | INTRAMUSCULAR | Status: DC | PRN
  Administered 2016-09-07 – 2016-09-08 (×3): 0.6 mg via INTRAVENOUS
  Filled 2016-09-06 (×3): qty 1

## 2016-09-06 MED ORDER — BISACODYL 5 MG PO TBEC
5.0000 mg | DELAYED_RELEASE_TABLET | Freq: Every day | ORAL | Status: DC | PRN
  Administered 2016-09-08: 5 mg via ORAL
  Filled 2016-09-06 (×3): qty 1

## 2016-09-06 MED ORDER — LABETALOL HCL 200 MG PO TABS
200.0000 mg | ORAL_TABLET | Freq: Three times a day (TID) | ORAL | Status: DC
  Administered 2016-09-06: 200 mg via ORAL
  Filled 2016-09-06: qty 1

## 2016-09-06 MED ORDER — KETOROLAC TROMETHAMINE 30 MG/ML IJ SOLN
INTRAMUSCULAR | Status: AC
  Filled 2016-09-06: qty 1

## 2016-09-06 MED ORDER — ONDANSETRON HCL 4 MG PO TABS
4.0000 mg | ORAL_TABLET | Freq: Four times a day (QID) | ORAL | Status: DC | PRN
  Administered 2016-09-08: 4 mg via ORAL
  Filled 2016-09-06 (×2): qty 1

## 2016-09-06 MED ORDER — HYDROMORPHONE HCL 1 MG/ML IJ SOLN
INTRAMUSCULAR | Status: AC
  Filled 2016-09-06: qty 1

## 2016-09-06 MED ORDER — MIDAZOLAM HCL 5 MG/5ML IJ SOLN
INTRAMUSCULAR | Status: DC | PRN
  Administered 2016-09-06: 2 mg via INTRAVENOUS

## 2016-09-06 MED ORDER — HYDROCODONE-ACETAMINOPHEN 5-325 MG PO TABS
1.0000 | ORAL_TABLET | ORAL | Status: DC | PRN
  Administered 2016-09-06 – 2016-09-09 (×10): 2 via ORAL
  Filled 2016-09-06 (×11): qty 2

## 2016-09-06 MED ORDER — VASOPRESSIN 20 UNIT/ML IV SOLN
INTRAVENOUS | Status: DC | PRN
  Administered 2016-09-06: 57 mL via INTRAMUSCULAR

## 2016-09-06 MED ORDER — DOCUSATE SODIUM 100 MG PO CAPS
100.0000 mg | ORAL_CAPSULE | Freq: Two times a day (BID) | ORAL | Status: DC
  Administered 2016-09-06 – 2016-09-10 (×8): 100 mg via ORAL
  Filled 2016-09-06 (×9): qty 1

## 2016-09-06 MED ORDER — PROPOFOL 10 MG/ML IV BOLUS
INTRAVENOUS | Status: DC | PRN
  Administered 2016-09-06: 150 mg via INTRAVENOUS
  Administered 2016-09-06: 50 mg via INTRAVENOUS

## 2016-09-06 MED ORDER — SODIUM CHLORIDE 0.9 % IJ SOLN
INTRAMUSCULAR | Status: AC
  Filled 2016-09-06: qty 50

## 2016-09-06 MED ORDER — LIDOCAINE HCL (CARDIAC) 20 MG/ML IV SOLN
INTRAVENOUS | Status: DC | PRN
  Administered 2016-09-06: 40 mg via INTRAVENOUS

## 2016-09-06 MED ORDER — CEFAZOLIN SODIUM-DEXTROSE 2-4 GM/100ML-% IV SOLN
2.0000 g | INTRAVENOUS | Status: AC
Start: 2016-09-06 — End: 2016-09-06
  Administered 2016-09-06: 2 g via INTRAVENOUS

## 2016-09-06 MED ORDER — HYDROMORPHONE HCL 1 MG/ML IJ SOLN
INTRAMUSCULAR | Status: DC | PRN
  Administered 2016-09-06: 1 mg via INTRAVENOUS

## 2016-09-06 MED ORDER — NEOSTIGMINE METHYLSULFATE 10 MG/10ML IV SOLN
INTRAVENOUS | Status: DC | PRN
  Administered 2016-09-06: 3 mg via INTRAVENOUS

## 2016-09-06 MED ORDER — MEPERIDINE HCL 25 MG/ML IJ SOLN: 6.2500 mg | INTRAMUSCULAR | Status: DC | PRN

## 2016-09-06 MED ORDER — LIDOCAINE HCL (CARDIAC) 20 MG/ML IV SOLN
INTRAVENOUS | Status: AC
  Filled 2016-09-06: qty 5

## 2016-09-06 MED ORDER — CEFAZOLIN SODIUM-DEXTROSE 2-4 GM/100ML-% IV SOLN: 2.0000 g | INTRAVENOUS | Status: DC

## 2016-09-06 MED ORDER — DEXAMETHASONE SODIUM PHOSPHATE 10 MG/ML IJ SOLN
INTRAMUSCULAR | Status: AC
  Filled 2016-09-06: qty 1

## 2016-09-06 MED ORDER — VASOPRESSIN 20 UNIT/ML IV SOLN
INTRAVENOUS | Status: AC
  Filled 2016-09-06: qty 1

## 2016-09-06 MED ORDER — HYDROMORPHONE HCL 1 MG/ML IJ SOLN
INTRAMUSCULAR | Status: AC
  Administered 2016-09-06: 0.25 mg via INTRAVENOUS
  Filled 2016-09-06: qty 1

## 2016-09-06 MED ORDER — LACTATED RINGERS IV SOLN
INTRAVENOUS | Status: DC
  Administered 2016-09-06: 1000 mL/h via INTRAVENOUS
  Administered 2016-09-06 (×4): via INTRAVENOUS

## 2016-09-06 MED ORDER — POTASSIUM CHLORIDE IN NACL 20-0.45 MEQ/L-% IV SOLN
INTRAVENOUS | Status: DC
  Administered 2016-09-06 – 2016-09-07 (×2): via INTRAVENOUS
  Filled 2016-09-06 (×6): qty 1000

## 2016-09-06 MED ORDER — NEOSTIGMINE METHYLSULFATE 10 MG/10ML IV SOLN
INTRAVENOUS | Status: AC
  Filled 2016-09-06: qty 1

## 2016-09-06 MED ORDER — SCOPOLAMINE 1 MG/3DAYS TD PT72
1.0000 | MEDICATED_PATCH | Freq: Once | TRANSDERMAL | Status: AC
  Administered 2016-09-06: 1.5 mg via TRANSDERMAL

## 2016-09-06 MED ORDER — ONDANSETRON HCL 4 MG/2ML IJ SOLN
INTRAMUSCULAR | Status: AC
  Filled 2016-09-06: qty 2

## 2016-09-06 MED ORDER — KETOROLAC TROMETHAMINE 30 MG/ML IJ SOLN
30.0000 mg | Freq: Once | INTRAMUSCULAR | Status: AC
  Administered 2016-09-06: 30 mg via INTRAVENOUS

## 2016-09-06 MED ORDER — PROMETHAZINE HCL 25 MG/ML IJ SOLN: 6.2500 mg | INTRAMUSCULAR | Status: DC | PRN

## 2016-09-06 MED ORDER — ACETAMINOPHEN 10 MG/ML IV SOLN
1000.0000 mg | Freq: Once | INTRAVENOUS | Status: AC
  Administered 2016-09-06: 1000 mg via INTRAVENOUS
  Filled 2016-09-06: qty 100

## 2016-09-06 MED ORDER — DEXAMETHASONE SODIUM PHOSPHATE 10 MG/ML IJ SOLN
INTRAMUSCULAR | Status: DC | PRN
  Administered 2016-09-06: 10 mg via INTRAVENOUS

## 2016-09-06 MED ORDER — PROPOFOL 10 MG/ML IV BOLUS
INTRAVENOUS | Status: AC
  Filled 2016-09-06: qty 20

## 2016-09-06 MED ORDER — ALUM & MAG HYDROXIDE-SIMETH 200-200-20 MG/5ML PO SUSP
30.0000 mL | ORAL | Status: DC | PRN
  Administered 2016-09-06: 30 mL via ORAL
  Filled 2016-09-06: qty 30

## 2016-09-06 MED ORDER — SCOPOLAMINE 1 MG/3DAYS TD PT72
MEDICATED_PATCH | TRANSDERMAL | Status: AC
  Filled 2016-09-06: qty 1

## 2016-09-06 MED ORDER — GLYCOPYRROLATE 0.2 MG/ML IJ SOLN
INTRAMUSCULAR | Status: DC | PRN
  Administered 2016-09-06: 0.6 mg via INTRAVENOUS

## 2016-09-06 MED ORDER — FENTANYL CITRATE (PF) 100 MCG/2ML IJ SOLN
INTRAMUSCULAR | Status: AC
  Filled 2016-09-06: qty 2

## 2016-09-06 MED ORDER — GLYCOPYRROLATE 0.2 MG/ML IJ SOLN
INTRAMUSCULAR | Status: AC
  Filled 2016-09-06: qty 4

## 2016-09-06 SURGICAL SUPPLY — 32 items
CLOTH BEACON ORANGE TIMEOUT ST (SAFETY) ×2 IMPLANT
DRAIN JACKSON PRT FLT 7MM (DRAIN) ×2 IMPLANT
DRAIN PENROSE 1/4X12 LTX (DRAIN) ×2 IMPLANT
DRSG OPSITE POSTOP 4X10 (GAUZE/BANDAGES/DRESSINGS) ×2 IMPLANT
EVACUATOR SILICONE 100CC (DRAIN) ×2 IMPLANT
GAUZE SPONGE 4X4 16PLY XRAY LF (GAUZE/BANDAGES/DRESSINGS) ×4 IMPLANT
GLOVE BIO SURGEON STRL SZ8 (GLOVE) ×2 IMPLANT
GLOVE BIOGEL PI IND STRL 7.0 (GLOVE) ×1 IMPLANT
GLOVE BIOGEL PI INDICATOR 7.0 (GLOVE) ×1
GLOVE INDICATOR 8.5 STRL (GLOVE) ×2 IMPLANT
GOWN STRL REUS W/TWL LRG LVL3 (GOWN DISPOSABLE) ×6 IMPLANT
NS IRRIG 1000ML POUR BTL (IV SOLUTION) ×2 IMPLANT
PACK ABDOMINAL GYN (CUSTOM PROCEDURE TRAY) ×2 IMPLANT
PAD OB MATERNITY 4.3X12.25 (PERSONAL CARE ITEMS) ×2 IMPLANT
PENCIL SMOKE EVAC W/HOLSTER (ELECTROSURGICAL) ×2 IMPLANT
PROTECTOR NERVE ULNAR (MISCELLANEOUS) ×2 IMPLANT
SEPRAFILM MEMBRANE 5X6 (MISCELLANEOUS) ×4 IMPLANT
SPONGE LAP 18X18 X RAY DECT (DISPOSABLE) ×2 IMPLANT
SUT MNCRL AB 4-0 PS2 18 (SUTURE) IMPLANT
SUT PLAIN 2 0 XLH (SUTURE) ×2 IMPLANT
SUT SILK 2 0 FS (SUTURE) ×2 IMPLANT
SUT VIC AB 0 CT1 27 (SUTURE) ×2
SUT VIC AB 0 CT1 27XBRD ANBCTR (SUTURE) ×2 IMPLANT
SUT VIC AB 2-0 CT1 27 (SUTURE) ×14
SUT VIC AB 2-0 CT1 TAPERPNT 27 (SUTURE) ×14 IMPLANT
SUT VIC AB 4-0 SH 27 (SUTURE) ×7
SUT VIC AB 4-0 SH 27XANBCTRL (SUTURE) ×7 IMPLANT
SUT VICRYL 0 TIES 12 18 (SUTURE) ×2 IMPLANT
SYR 50ML SLIP (SYRINGE) IMPLANT
TOWEL OR 17X24 6PK STRL BLUE (TOWEL DISPOSABLE) ×4 IMPLANT
TRAY FOLEY CATH SILVER 14FR (SET/KITS/TRAYS/PACK) ×2 IMPLANT
WATER STERILE IRR 1000ML POUR (IV SOLUTION) IMPLANT

## 2016-09-06 NOTE — Anesthesia Postprocedure Evaluation (Signed)
Anesthesia Post Note  Patient: Jennifer [name]  Procedure(s) Performed: Procedure(s) (LRB): ABOMINAL MYOMECTOMY (N/A)  Patient location during evaluation: PACU Anesthesia Type: General Level of consciousness: sedated Pain management: pain level controlled Vital Signs Assessment: post-procedure vital signs reviewed and stable Respiratory status: spontaneous breathing and respiratory function stable Cardiovascular status: stable Anesthetic complications: no        Last Vitals:  Vitals:   09/06/16 1745 09/06/16 1800  BP: (!) 154/105 (!) 161/97  Pulse:    Resp:    Temp:  36.5 C    Last Pain:  Vitals:   09/06/16 1815  TempSrc:   PainSc: Asleep   Pain Goal: Patients Stated Pain Goal: 3 (09/06/16 1815)               [name] DANIEL

## 2016-09-06 NOTE — H&P (Signed)
Jennifer [name] is a 40 y.o. female , originally referred to me by Dr. Ophelia Charter, for myomectomy.  She was diagnosed with fibroids because of abnormal uterine bleeding and was placed on NSAIDs.  She developed breakthrough bleeding and had to stop the pills.  She has been having monthly periods but with heavy flow and prolonged duration. She was also evaluated by IR after and MRI showed "Uterus measures 9.9 x 10.9 x 13.7 cm. Endometrial complex measures 9  mm (series 14/image 15).  Numerous uterine fibroids, approximately 25-30 in number, including:  --4.9 x 5.6 cm subserosal anterior fundal fibroid (series 6/image 10)  --5.0 x 3.8 cm subserosal right lateral fundal fibroid (series 6/image 15)  --3.9 x 3.3 cm submucosal anterior right uterine body fibroid (series 6/image 19)  All fibroids uniformly enhance following contrast administration.  Left ovary measures 3.1 x 2.5 cm (series 6/ image 12), within normal limits.  Right ovary measures 3.5 x 4.1 cm (series 6/ image 7), and is notable for a dominant 2.8 cm corpus luteal cyst, physiologic.  Trace pelvic ascites (series 6/ image 18), physiologic.  No suspicious pelvic lymphadenopathy.   IMPRESSION: Numerous uterine fibroids, with representative fibroids as above".  Patient would like to preserve her childbearing potential. Because of this IR did not recommend BUAE. She has also been trying to conceive x 2 yrs.    Pertinent Gynecological History: Menses: flow is excessive with use of 3 pads or tampons on heaviest days Bleeding: dysfunctional uterine bleeding Contraception: none DES exposure: denies Blood transfusions: none Sexually transmitted diseases: no past history Previous GYN Procedures: EAb Last mammogram: normal Last pap: normal  OB History: EAb 19 yr ago   Menstrual History: Menarche age: 63    Past Medical History:  Diagnosis Date  . Acute otitis media 08/28/2012   improved  change to liquid medication    . Anemia   . Breast abscess of female    Recurrent  . Fibroids    w/bleeding  . Headache(784.0)    severe migraines  . Hypertension   . Migraines    Dr. Orie Rout  . Pill esophagitis 08/28/2012   amoxicillin  by hx  disc plan nl voice except hoarse not drooling  close fu  if not getting better with plan stop aleve  liquid ibu onlyf necessary   . Recurrent periodic urticaria   . Rheumatoid arthritis [facility])                     Past Surgical History:  Procedure Laterality Date  . breast abscess  2003   removal  . CYST EXCISION Left    arm             Family History  Problem Relation Age of Onset  . Hypertension Mother   . Rheum arthritis Mother   . Hypertension Father    No hereditary disease.  No cancer of breast, ovary, uterus. No cutaneous leiomyomatosis or renal cell carcinoma.  Social History   Social History  . Marital status: Single    Spouse name: N/A  . Number of children: N/A  . Years of education: N/A   Occupational History  . Not on file.   Social History Main Topics  . Smoking status: Never Smoker  . Smokeless tobacco: Never Used  . Alcohol use 1.2 oz/week    2 Standard drinks or equivalent per week  . Drug use: No  . Sexual activity: Yes    Birth control/ protection:  None   Other Topics Concern  . Not on file   Social History Narrative  . No narrative on file    No Known Allergies  No current facility-administered medications on file prior to encounter.    Current Outpatient Prescriptions on File Prior to Encounter  Medication Sig Dispense Refill  . baclofen (LIORESAL) 10 MG tablet Take 1 tablet by mouth 2 (two) times daily as needed.  0  . FLUoxetine (PROZAC) 10 MG capsule Take 10 mg by mouth daily.  5  . iron polysaccharides (FERREX 150) 150 MG capsule Take 1 capsule (150 mg total) by mouth daily. (Patient taking differently: Take 150 mg by mouth every other day. ) 30 capsule 5  . labetalol (NORMODYNE) 200 MG tablet Take  300 mg twice a day can increase to 400 mg twice a day for bp control (Patient taking differently: Take 200 mg by mouth 3 (three) times daily. ) 120 tablet 3  . zonisamide (ZONEGRAN) 50 MG capsule Take 100 mg by mouth daily.  0  . temazepam (RESTORIL) 15 MG capsule Take 15 mg by mouth at bedtime as needed for sleep.       Review of Systems  Constitutional: Negative.   HENT: Negative.   Eyes: Negative.   Respiratory: Negative.   Cardiovascular: Negative.   Gastrointestinal: Negative.   Genitourinary: Negative.   Musculoskeletal: Negative.   Skin: Negative.   Neurological: Negative.   Endo/Heme/Allergies: Negative.   Psychiatric/Behavioral: Negative.     Physical Exam  BP (!) (P) 141/110   Pulse (P) 70   Temp (P) 97.6 F (36.4 C) (Oral)   Resp (P) 20   LMP 08/26/2016 (Exact Date)   SpO2 (P) 100%  Constitutional: She is oriented to person, place, and time. She appears well-developed and well-nourished.  HENT:  Head: Normocephalic and atraumatic.  Nose: Nose normal.  Mouth/Throat: Oropharynx is clear and moist. No oropharyngeal exudate.  Eyes: Conjunctivae normal and EOM are normal. Pupils are equal, round, and reactive to light. No scleral icterus.  Neck: Normal range of motion. Neck supple. No tracheal deviation present. No thyromegaly present.  Cardiovascular: Normal rate.   Respiratory: Effort normal and breath sounds normal.  GI: Soft. Bowel sounds are normal. She exhibits no distension and no mass. There is no tenderness.  Lymphadenopathy:    She has no cervical adenopathy.  Neurological: She is alert and oriented to person, place, and time. She has normal reflexes.  Skin: Skin is warm.  Psychiatric: She has a normal mood and affect. Her behavior is normal. Judgment and thought content normal.    Assessment/Plan:  Intramural and subserosal uterine myomas, causing menorrhagia and pressure sensation. Preoperative for abdominal myomectomy Benefits and risks of  myomectomy were discussed with the patient and her family member again.  Bowel prep instructions were given.  All of patient's questions were answered.  She verbalized understanding.  She knows that she will need a cesarean delivery for future pregnancies, and that it is recommended she does not conceive for 2-3 months for uterus to heal.

## 2016-09-06 NOTE — Anesthesia Preprocedure Evaluation (Signed)
Anesthesia Evaluation  Patient identified by MRN, date of birth, ID band Patient awake    Reviewed: Allergy & Precautions, H&P , NPO status , Patient's Chart, lab work & pertinent test results, reviewed documented beta blocker date and time   Airway Mallampati: III  TM Distance: >3 FB Neck ROM: full    Dental no notable dental hx.    Pulmonary neg pulmonary ROS,    Pulmonary exam normal        Cardiovascular Exercise Tolerance: Good hypertension, Pt. on home beta blockers Normal cardiovascular exam     Neuro/Psych negative neurological ROS  negative psych ROS   GI/Hepatic negative GI ROS, Neg liver ROS,   Endo/Other  Morbid obesity  Renal/GU negative Renal ROS  negative genitourinary   Musculoskeletal   Abdominal (+) + obese,   Peds  Hematology   Anesthesia Other Findings   Reproductive/Obstetrics negative OB ROS                             Anesthesia Physical Anesthesia Plan  ASA: III  Anesthesia Plan: General   Post-op Pain Management:    Induction: Intravenous  Airway Management Planned: Oral ETT  Additional Equipment:   Intra-op Plan:   Post-operative Plan: Extubation in OR  Informed Consent: I have reviewed the patients History and Physical, chart, labs and discussed the procedure including the risks, benefits and alternatives for the proposed anesthesia with the patient or authorized representative who has indicated his/her understanding and acceptance.   Dental Advisory Given  Plan Discussed with: CRNA and Surgeon  Anesthesia Plan Comments:         Anesthesia Quick Evaluation

## 2016-09-06 NOTE — Progress Notes (Signed)
Blood pressure 166/108, pt states she took her am dose of Labetalol, usually takes 200 mg TID. Dr Tobias Alexander notified of BP, Labetalol ordered, 200mg  PO now.

## 2016-09-06 NOTE — Anesthesia Procedure Notes (Signed)
Procedure Name: Intubation Date/Time: 09/06/2016 11:28 AM Performed by: Ignacia Bayley Pre-anesthesia Checklist: Patient identified, Emergency Drugs available, Suction available and Patient being monitored Patient Re-evaluated:Patient Re-evaluated prior to inductionOxygen Delivery Method: Circle system utilized Preoxygenation: Pre-oxygenation with 100% oxygen Intubation Type: IV induction Ventilation: Mask ventilation without difficulty Laryngoscope Size: Miller and 2 Grade View: Grade II Tube type: Oral Tube size: 7.0 mm Number of attempts: 1 Airway Equipment and Method: Stylet Placement Confirmation: ETT inserted through vocal cords under direct vision,  positive ETCO2 and breath sounds checked- equal and bilateral Secured at: 21 cm Tube secured with: Tape Dental Injury: Teeth and Oropharynx as per pre-operative assessment

## 2016-09-06 NOTE — Progress Notes (Signed)
1750 Good relief from Dilaudid though oxygen sat decreases to 88 occasionally, increases immed to 100 with stimulation and deep breaths. Anesthesia here oxygen sat noted, status reviewed, pulse oximetry on floor ordered. Toradol ordered to be given now as well. Pt sleeping often, awakens easily complains of pain though returns to sleep.

## 2016-09-06 NOTE — Op Note (Signed)
OPERATIVE NOTE  Preoperative diagnosis: Multiple uterine fibroids, intramural and subserosal, menorrhagia  Postoperative diagnosis: Multiple uterine fibroids, intramural and subserosal, menorrhagia  Procedure: Exploratory laparotomy, abdominal myomectomy  Anesthesia: Gen. Endotracheal  Surgeon: Governor Specking, MD  Asst.: Gar Ponto, RN-FA  Estimated blood loss: 1000 mL  Specimens: Uterine myomas (35 of them to pathology)  Complications: None  Findings: Uterus was enlarged to 16 gestational weeks size with multiple intramural and subserosal myomas. There were 35 myomas ranging in size from 6 x 5 cm down to 1 x 1 cm. The larger myomas were on the anterior surface of the uterus including a 4 x 4 centimeter left lower intraligamentary myoma. There were 2 x 2 centimeters myomas in the left and cornual region over which the fallopian tubes were stretched proximal segment.  Both fallopian tubes otherwise appeared normal with 5 out of 5 fimbria. It is not performed chromotubation because of inability to insert a transcervical catheter due to the high location and difficult axis of the cervix.  Both ovaries appeared. Due to patient's obesity, relatively limited view of the posterior cul-de-sac was available but no endometriotic implants were noted.  Description of the procedure: Patient was placed in dorsal supine position general endotracheal anesthesia was given. She was given 2 g of cefazolin intravenously for prophylaxis. She was prepped and draped inside manner. A Pfannenstiel skin incision was made and was carried down to fascia the fascia was nicked and incised in transverse elliptical manner. It was separated from the underlying rectus muscle at midline by blunt and sharp dissection. The rectus muscles were separated at midline by blunt and sharp dissection. Peritoneum was elevated nicked and incised initially in vertical manner. The patient's abdominal adiposity and the sheer size  of the myomatous uterus did not allow [place] to deliver the uterus to the abdomen. Therefore decision was made to split the rectus muscles. This was carried out with the Bovie over an [employer] retractor. When we identified the inferior epigastric vessels we clamped divided and tied with 0 Vicryl sutures. The peritoneal incision was extended laterally. The uterus could not be delivered to the abdomen. A quarter inch Penrose drain was passed through the fenestrations made below the lower uterine segment on the broad ligaments and then the drain was tied creating a tourniquet during the myomectomy. Dilute vasopressin solution (20 units in 50 mL) was injected into the myometrium. The myometrium overlying the prominent myomas was cut the underlying myoma was grasped with towel clips and dissected out with blunt and sharp dissection. It was necessary to make 3 vertical incisions (2-12 cm long) over the cluster of myomas to access all of the palpable myomas and remove them, including the left intraligamentary myoma and a 2 cm myoma stretching the proximal segment of both tubes at the fundal region. Posteriorly the clustering and location of the myomas required total of 2 transverse incisions through which all the myomas were accessed and enucleated. Endometrial layer was identified but was not entered during the procedure. The resulting myomectomy defects were closed in 3-5 layers, depending on the depth of the defect. 2-0 Vicryl continuous interlocking suture was used at the deepest layer and 2-0 Vicryl continuous suture was closed on the more superficial myometrial layers. The serosa layer was closed with 4-0 Vicryl continuous suture. Sufficient hemostasis was obtained and additional figure-of-eight sutures were placed on the anterior surface of the uterus wears some oozing was identified. The uterus was replaced in the abdomen. 2 interrupted sutures of 4-0 Vicryl placed  on the parietal peritoneum.  A 4 mm  Jackson-Pratt drain was placed in the pelvic cavity with the drain exiting from the left lower quadrant abdominal wall. It was affixed to the skin with 2-0 silk suture. Hemostasis on the rectus muscle was insured. The fascia was closed with 20 and 0 Vicryl continuous sutures. Additional 0 Vicryl figure-of-eight sutures were placed on the right side of the fascia. The subcutaneous layer was irrigated and hemostasis was insured. 3 sutures of 2 Interrupted were placed on the Scarpa's fascia. The skin was closed with 4-0 Monocryl subcuticular stitches. Patient tolerated the procedure well and was transferred to recovery room in satisfactory condition.  SPECIAL NOTE: Because of the extent of the myometrial incisions, the patient should be delivered via C-section with future pregnancies.

## 2016-09-06 NOTE — Transfer of Care (Signed)
Immediate Anesthesia Transfer of Care Note  Patient: Jennifer [name]  Procedure(s) Performed: Procedure(s): ABOMINAL MYOMECTOMY (N/A)  Patient Location: PACU  Anesthesia Type:General  Level of Consciousness: sedated  Airway & Oxygen Therapy: Patient Spontanous Breathing and Patient connected to nasal cannula oxygen  Post-op Assessment: Report given to RN and Post -op Vital signs reviewed and stable  Post vital signs: stable  Last Vitals:  Vitals:   09/06/16 0928 09/06/16 1530  BP: (!) 141/110 (!) 133/92  Pulse: 70 82  Resp: 20 13  Temp: 36.4 C 36.8 C    Last Pain:  Vitals:   09/06/16 0928  TempSrc: (P) Oral  PainSc: 4       Patients Stated Pain Goal: 3 ([id number])  Complications: No apparent anesthesia complications

## 2016-09-06 NOTE — Progress Notes (Signed)
Pt arrived on the unit with stable vitals, sleepy though easily aroused. Emptied 50 mls out of JP drain on patients lefts side with off reporting nurse, no  Vaginal bleeding noted, honey comb dressing had a half inch of drainage marked on the bottom of the dressing. Went in for the 1hr post check and emptied 17mls out of the JP drain the honey comb was completely saturated and draining out under the dressing, the dressing around the JP drain was noted to have drainage starting to show through it's layers and the peri pad was saturated. VS stable O2 sats at 100 pulse 70s, resp at 18,  BP high at 154/103 though consistent with previous readings for this patient. Called Dr Kerin Perna, got order to put in a type and screen with next lab draw, place a pressure dressing and get an abdominal binder for the pt. Placed Pressure dressing and 15 mins later there is no new drainage showing on the pressure dressing.

## 2016-09-06 NOTE — Anesthesia Postprocedure Evaluation (Signed)
Anesthesia Post Note  Patient: Jennifer [name]  Procedure(s) Performed: Procedure(s) (LRB): ABOMINAL MYOMECTOMY (N/A)  Patient location during evaluation: [facility] Level of consciousness: awake and alert and sedated Pain management: pain level controlled Vital Signs Assessment: post-procedure vital signs reviewed and stable Respiratory status: spontaneous breathing Cardiovascular status: blood pressure returned to baseline Postop Assessment: no headache        Last Vitals:  Vitals:   09/06/16 2024 09/06/16 2031  BP: (!) 154/103   Pulse: 77   Resp: 18   Temp:  36.7 C    Last Pain:  Vitals:   09/06/16 2031  TempSrc: Axillary  PainSc:    Pain Goal: Patients Stated Pain Goal: 3 (09/06/16 1855)               Ailene Ards

## 2016-09-07 ENCOUNTER — Encounter (HOSPITAL_COMMUNITY): Payer: Self-pay | Admitting: Obstetrics and Gynecology

## 2016-09-07 LAB — CBC
HCT: 30.3 % — ABNORMAL LOW (ref 36.0–46.0)
HCT: 33.4 % — ABNORMAL LOW (ref 36.0–46.0)
HEMOGLOBIN: 11.4 g/dL — AB (ref 12.0–15.0)
HEMOGLOBIN: 10.2 g/dL — AB (ref 12.0–15.0)
MCH: 29.9 pg (ref 26.0–34.0)
MCH: 30.1 pg (ref 26.0–34.0)
MCHC: 34.1 g/dL (ref 30.0–36.0)
MCHC: 33.7 g/dL (ref 30.0–36.0)
MCV: 87.7 fL (ref 78.0–100.0)
MCV: 89.4 fL (ref 78.0–100.0)
PLATELETS: 195 10*3/uL (ref 150–400)
PLATELETS: 235 10*3/uL (ref 150–400)
RBC: 3.81 MIL/uL — ABNORMAL LOW (ref 3.87–5.11)
RBC: 3.39 MIL/uL — AB (ref 3.87–5.11)
RDW: 13.2 % (ref 11.5–15.5)
RDW: 14.6 % (ref 11.5–15.5)
WBC: 13.9 10*3/uL — ABNORMAL HIGH (ref 4.0–10.5)
WBC: 17 10*3/uL — ABNORMAL HIGH (ref 4.0–10.5)

## 2016-09-07 LAB — FIBRINOGEN: FIBRINOGEN: 284 mg/dL (ref 210–475)

## 2016-09-07 LAB — PROTIME-INR
INR: 1.2
Prothrombin Time: 15.3 seconds — ABNORMAL HIGH (ref 11.4–15.2)

## 2016-09-07 LAB — APTT: APTT: 25 s (ref 24–36)

## 2016-09-07 LAB — PREPARE RBC (CROSSMATCH)

## 2016-09-07 MED ORDER — SODIUM CHLORIDE 0.9 % IV SOLN: INTRAVENOUS | Status: DC

## 2016-09-07 MED ORDER — TRANEXAMIC ACID 1000 MG/10ML IV SOLN
1000.0000 mg | Freq: Once | INTRAVENOUS | Status: AC
  Administered 2016-09-07: 1000 mg via INTRAVENOUS
  Filled 2016-09-07: qty 10

## 2016-09-07 MED ORDER — INFLUENZA VAC SPLIT QUAD 0.5 ML IM SUSY: 0.5000 mL | PREFILLED_SYRINGE | INTRAMUSCULAR | Status: DC

## 2016-09-07 MED ORDER — SODIUM CHLORIDE 0.9 % IV SOLN
Freq: Once | INTRAVENOUS | Status: AC
  Administered 2016-09-07: 02:00:00 via INTRAVENOUS

## 2016-09-07 NOTE — Progress Notes (Signed)
MD removed pressure dressing and irrigated wound with sterile NS. Wound packed with gauze and betadine applied. Steri strips and abdominal pads applied to wound. Hypofix holding abdominal  Pads in place.

## 2016-09-08 DIAGNOSIS — D251 Intramural leiomyoma of uterus: Secondary | ICD-10-CM | POA: Diagnosis present

## 2016-09-08 DIAGNOSIS — D250 Submucous leiomyoma of uterus: Secondary | ICD-10-CM | POA: Diagnosis present

## 2016-09-08 DIAGNOSIS — N939 Abnormal uterine and vaginal bleeding, unspecified: Secondary | ICD-10-CM | POA: Diagnosis present

## 2016-09-08 DIAGNOSIS — N920 Excessive and frequent menstruation with regular cycle: Secondary | ICD-10-CM | POA: Diagnosis present

## 2016-09-08 DIAGNOSIS — W010XXA Fall on same level from slipping, tripping and stumbling without subsequent striking against object, initial encounter: Secondary | ICD-10-CM | POA: Diagnosis not present

## 2016-09-08 DIAGNOSIS — Z6841 Body Mass Index (BMI) 40.0 and over, adult: Secondary | ICD-10-CM | POA: Diagnosis not present

## 2016-09-08 DIAGNOSIS — D252 Subserosal leiomyoma of uterus: Secondary | ICD-10-CM | POA: Diagnosis present

## 2016-09-08 DIAGNOSIS — D62 Acute posthemorrhagic anemia: Secondary | ICD-10-CM | POA: Diagnosis not present

## 2016-09-08 DIAGNOSIS — Y92230 Patient room in hospital as the place of occurrence of the external cause: Secondary | ICD-10-CM | POA: Diagnosis not present

## 2016-09-08 DIAGNOSIS — I10 Essential (primary) hypertension: Secondary | ICD-10-CM | POA: Diagnosis present

## 2016-09-08 LAB — TYPE AND SCREEN
BLOOD PRODUCT EXPIRATION DATE: 201801092359
Blood Product Expiration Date: 201801022359
Blood Product Expiration Date: 201801022359
ISSUE DATE / TIME: 201712210232
ISSUE DATE / TIME: 201712210504
UNIT TYPE AND RH: 5100
Unit Type and Rh: 5100
Unit Type and Rh: 5100

## 2016-09-08 MED ORDER — HYDROMORPHONE HCL 2 MG PO TABS
2.0000 mg | ORAL_TABLET | ORAL | Status: DC | PRN
  Administered 2016-09-08 – 2016-09-10 (×11): 2 mg via ORAL
  Filled 2016-09-08 (×12): qty 1

## 2016-09-08 MED ORDER — LIDOCAINE HCL 2 % IJ SOLN
INTRAMUSCULAR | Status: AC
  Filled 2016-09-08: qty 40

## 2016-09-08 NOTE — Care Management Note (Signed)
Case Management Note  Patient Details  Name: Jennifer [name] MRN: [id number] Date of Birth: September 10, 1976  Subjective/Objective:        Abdominal Myomectomy            Action/Plan: HHRN for wound assessment and management.  Expected Discharge Date:   09/10/16               Expected Discharge Plan:  [place]  In-House Referral:  NA  Discharge planning Services  CM Consult  Post Acute Care Choice:  Home Health Choice offered to:  Patient  DME Arranged:  N/A DME Agency:   N/A  HH Arranged:  RN [place] Agency:  [place]  Status of Service:  Completed  Additional Comments: Case Management notified by Pt's Nurse of need for wound care.  Pt has an abdominal wound that MD would like to have monitored for one week. CM spoke with the Pt at the bedside in room 304, home address is different from face sheet.  Correct address is [address] [place], [place] [address].  Pt's cell number is [phone].  Alternate number is [phone] - [name].   CM discussed [place] agencies and Pt wanted to know if [place] is available - which they are.  CM left HHC list at bedside that contains [facility] name and number.  Referral made to [facility] with [facility].  Pt has possible dc date on 12/23 or 12/24.  Nurse aware of dc plan.  Pt has CM's name and number and CM available to assist as needed.  12/26- CM spoke with Joelene Millin at [facility] and they do have the Pt to follow.  [name] [place], [place] 09/08/2016, 10:11 AM

## 2016-09-08 NOTE — Progress Notes (Signed)
2 Days Post-Op Procedure(s) (LRB): ABDOMINAL MYOMECTOMY- 35 myomas (N/A)  Subjective: Patient reports incisional pain only. She has been out of bed and ambulated. She is tolerating her diet. Objective: I have reviewed patient's vital signs, intake and output, medications and labs. JP drain has put out only 100 mL of serosanguineous fluid over the last 8 hours.  General: alert and cooperative  Abdomen is soft, adipose, with appropriate incisional tenderness. Pfannenstiel incision is separated in the right half. The left side is intact and dry.. The subcutaneous tissue has intact interrupted sutures on Scarpa's fascia. The visible surfaces of the open wound are clear, without sign of infection.  Extremities: Negative Homans sign  Assessment: s/p status post exploratory laparotomy and complex myomectomy (35 myomas) with failure of the skin closure on postop day 0-1: progressing well and tolerating diet  Plan: Encourage ambulation Discontinue IV fluids  JP drain was removed I discussed with the patient the options of dealing with this very early skin closure disruption with the patient. Keeping the wound open and letting it heal from bottom of with a lengthy home health care for wound care, versus attempting to re-close skin with staples at bedside today. We decided to attempt the reclosure of the skin. This was performed after Betadine prepping of the skin and 1% lidocaine injection followed by staple application to the right half of the skin with good approximation of the skin edges. Patient tolerated the procedure well. If she is doing well by tomorrow without any wound complications, I will discharge her home with a short-term home health nurse visit to check the status of the incision over the holidays. I will see her in my [place] office next week Wednesday.   LOS: 0 days    [facility] 09/08/2016, 8:06 AM

## 2016-09-09 MED ORDER — IBUPROFEN 800 MG PO TABS
800.0000 mg | ORAL_TABLET | Freq: Four times a day (QID) | ORAL | Status: DC | PRN
  Administered 2016-09-09 – 2016-09-10 (×3): 800 mg via ORAL
  Filled 2016-09-09 (×3): qty 1

## 2016-09-09 NOTE — Progress Notes (Signed)
3 Days Post-Op Procedure(s) (LRB): ABOMINAL MYOMECTOMY (N/A)  Subjective: Patient reports incisional pain.    Objective: I have reviewed patient's vital signs, intake and output, medications and labs.  General: alert, no distress and moderately obese   Abdomen: soft adipose Incision: edges not indurated, stapled rt half and s.c closure left half are intact and dry. 1x1 cm midportion separated.  Ext: neg c/c/e    Assessment: s/p Procedure(s): ABOMINAL MYOMECTOMY (N/A): stable, with recently reclosed rt half of Pfannenstiel incision Pt needs to be observed 1 more day for any wound infection or disruption, as well as pain management  Plan: Encourage ambulation  Will discharge home tomorrow OK to supplement with NSAIDs   LOS: 1 day    [facility] 09/09/2016, 3:07 PM

## 2016-09-10 NOTE — Progress Notes (Signed)
Pt  Refused FLU VACCINE prior to d/c home

## 2016-09-10 NOTE — Progress Notes (Signed)
Discharge   To home   Teaching complete

## 2016-09-15 LAB — TYPE AND SCREEN
ABO/RH(D): B POS
Antibody Screen: NEGATIVE

## 2016-10-04 NOTE — Addendum Note (Signed)
Addended byShanon Ace K on: 10/04/2016 11:35 AM   Modules accepted: Orders

## 2016-10-10 NOTE — Discharge Summary (Signed)
OB Discharge Summary     Patient Name: Jennifer [name] DOB: Aug 16, 1976 MRN: [id number]  Date of admission: 09/06/2016  Date of discharge: 09/10/2016  Admitting diagnosis: INTRAMURAL LEIOMYNA OF UTERUS  Secondary diagnosis:  Active Problems:   Leiomyoma of uterus  Additional problems: Anemia due to blood loss     Discharge diagnosis: Intramural leiomyomas of uterus                                                                                               Complications: Dissection of skin closure on postop day 1, reapproximated the bedside  Hospital course:  Patient underwent exploratory laparotomy and a complex myomectomy, without immediate complications. Continued intraperitoneal losing as evidenced by JP drain necessitated blood transfusion of 2 units of packed red cells. Patient remained stable throughout. Her diet was progressed and she ambulated without problems postoperatively. She sustained a fall on postop day 0, after which disruption of the skin closure on the left half of the incision was observed. This was approximated with staples at bedside under local anesthesia. She continued to make progress with ambulation and pain control postoperatively. She was discharged home on postop day 4.  Physical exam  Vitals:   09/09/16 2015 09/10/16 0110 09/10/16 0509 09/10/16 0803  BP: 138/80 133/86 129/84 (!) 153/98  Pulse: 92 (!) 102 95 96  Resp: 20 20 20 18   Temp: 98.3 F (36.8 C) 98.6 F (37 C) 98.5 F (36.9 C) 98.6 F (37 C)  TempSrc: Oral Oral Oral Oral  SpO2: 99% 100% 96% 96%  Weight:      Height:       General: alert, cooperative and no distress Incision: Healing well with no significant drainage DVT Evaluation: No evidence of DVT seen on physical exam. Labs: Lab Results  Component Value Date   WBC 17.0 (H) 09/07/2016   HGB 11.4 (L) 09/07/2016   HCT 33.4 (L) 09/07/2016   MCV 87.7 09/07/2016   PLT 195 09/07/2016   CMP Latest Ref Rng & Units 09/06/2016   Glucose 65 - 99 mg/dL 163(H)  BUN 6 - 20 mg/dL 11  Creatinine 0.44 - 1.00 mg/dL 0.94  Sodium 135 - 145 mmol/L 131(L)  Potassium 3.5 - 5.1 mmol/L 4.2  Chloride 101 - 111 mmol/L 101  CO2 22 - 32 mmol/L 24  Calcium 8.9 - 10.3 mg/dL 8.3(L)  Total Protein 6.1 - 8.1 g/dL -  Total Bilirubin 0.2 - 1.2 mg/dL -  Alkaline Phos 33 - 115 U/L -  AST 10 - 30 U/L -  ALT 6 - 29 U/L -    Discharge instruction: per After Visit Summary and "Baby and Me Booklet".  After visit meds:  Allergies as of 09/10/2016   No Known Allergies     Medication List    STOP taking these medications   amoxicillin 500 MG capsule Commonly known as:  AMOXIL     TAKE these medications   baclofen 10 MG tablet Commonly known as:  LIORESAL Take 1 tablet by mouth 2 (two) times daily as needed.   doxepin 10 MG capsule Commonly known  asMilinda Cave Take 10 mg by mouth daily as needed.   FLUoxetine 10 MG capsule Commonly known as:  PROZAC Take 10 mg by mouth daily.   iron polysaccharides 150 MG capsule Commonly known as:  FERREX 150 Take 1 capsule (150 mg total) by mouth daily. What changed:  when to take this   labetalol 200 MG tablet Commonly known as:  NORMODYNE Take 300 mg twice a day can increase to 400 mg twice a day for bp control What changed:  how much to take  how to take this  when to take this  additional instructions   Melatonin 10 MG Tabs Take by mouth at bedtime as needed.   methocarbamol 500 MG tablet Commonly known as:  ROBAXIN Take 500 mg by mouth 2 (two) times daily as needed for muscle spasms. Limit to 2 days per week.   multivitamin with minerals tablet Take 1 tablet by mouth daily.   temazepam 15 MG capsule Commonly known as:  RESTORIL Take 15 mg by mouth at bedtime as needed for sleep.   zonisamide 50 MG capsule Commonly known as:  ZONEGRAN Take 100 mg by mouth daily.       Diet: routine diet  Activity: Advance as tolerated. Pelvic rest for 6 weeks.    Outpatient follow up:2 weeks Follow up Appt:No future appointments. Follow up Visit:No Follow-up on file.  Disposition: Home with planned home nurse visits to monitor her abdominal incision for the next 2 weeks.   10/10/2016 Governor Specking, MD

## 2016-11-10 NOTE — Progress Notes (Unsigned)
Received CIMZIA from [place]. Put medication in refrigerator   Alec Mcphee, CPhT 10:20 AM

## 2016-11-14 ENCOUNTER — Telehealth: Payer: Self-pay | Admitting: Rheumatology

## 2016-11-14 NOTE — Telephone Encounter (Signed)
Patient called to schedule her nurse appointment.  Cb#[phone].

## 2016-11-14 NOTE — Telephone Encounter (Signed)
Called patient to schedule nurse visit

## 2016-11-16 ENCOUNTER — Telehealth: Payer: Self-pay | Admitting: Pharmacist

## 2016-11-16 NOTE — Telephone Encounter (Signed)
Received an adverse event report from manufacturer of Cimzia.  In 06/06/2016, a report was filed stating patient's migraines are getting worse.  Noted patient was seen on 08/02/16 and did not mention concern of migraines.    I called patient to discuss.  I left a voicemail asking her to call me back.

## 2016-11-17 ENCOUNTER — Other Ambulatory Visit: Payer: Self-pay | Admitting: Pharmacist

## 2016-11-17 ENCOUNTER — Ambulatory Visit: Payer: Commercial Managed Care - PPO

## 2016-11-17 DIAGNOSIS — Z79899 Other long term (current) drug therapy: Secondary | ICD-10-CM

## 2016-11-17 LAB — COMPLETE METABOLIC PANEL WITH GFR
ALT: 10 U/L (ref 6–29)
AST: 14 U/L (ref 10–30)
Albumin: 3.5 g/dL — ABNORMAL LOW (ref 3.6–5.1)
Alkaline Phosphatase: 57 U/L (ref 33–115)
BILIRUBIN TOTAL: 0.4 mg/dL (ref 0.2–1.2)
BUN: 13 mg/dL (ref 7–25)
CO2: 23 mmol/L (ref 20–31)
CREATININE: 0.86 mg/dL (ref 0.50–1.10)
Calcium: 8.6 mg/dL (ref 8.6–10.2)
Chloride: 106 mmol/L (ref 98–110)
GFR, Est African American: >89 mL/min (ref >=60)
GFR, Est Non African American: 84 mL/min (ref >=60)
GLUCOSE: 79 mg/dL (ref 65–99)
Potassium: 4 mmol/L (ref 3.5–5.3)
SODIUM: 137 mmol/L (ref 135–146)
TOTAL PROTEIN: 7.3 g/dL (ref 6.1–8.1)

## 2016-11-17 LAB — CBC WITH DIFFERENTIAL/PLATELET
BASOS PCT: 0 %
Basophils Absolute: 0 cells/uL (ref 0–200)
EOS PCT: 2 %
Eosinophils Absolute: 156 cells/uL (ref 15–500)
HCT: 34.5 % — ABNORMAL LOW (ref 35.0–45.0)
Hemoglobin: 11 g/dL — ABNORMAL LOW (ref 11.7–15.5)
Lymphocytes Relative: 39 %
Lymphs Abs: 3042 cells/uL (ref 850–3900)
MCH: 28.8 pg (ref 27.0–33.0)
MCHC: 31.9 g/dL — ABNORMAL LOW (ref 32.0–36.0)
MCV: 90.3 fL (ref 80.0–100.0)
MONO ABS: 1014 {cells}/uL — AB (ref 200–950)
MPV: 9.8 fL (ref 7.5–12.5)
Monocytes Relative: 13 %
Neutro Abs: 3588 cells/uL (ref 1500–7800)
Neutrophils Relative %: 46 %
PLATELETS: 276 10*3/uL (ref 140–400)
RBC: 3.82 MIL/uL (ref 3.80–5.10)
RDW: 14.3 % (ref 11.0–15.0)
WBC: 7.8 10*3/uL (ref 3.8–10.8)

## 2016-11-17 NOTE — Telephone Encounter (Signed)
I spoke to patient regarding her concern about migraines with Cimzia.  Patient reports she forgot she submitted that form.  She reports she has had migraines since before initiation of Cimzia.  She reports she has had Cimzia since the form was submitted on 06/06/16.  Patient denies any concern about continuing therapy.  Will complete adverse drug event form and submit to [facility].   Noted patient is scheduled for Cimzia injection in the office today.  She reports her Cimzia has been on hold due to surgery in December.  Patient confirms her surgical site has healed and she has been released by her surgeon.     Elisabeth Most, Pharm.D., BCPS, CPP Clinical Pharmacist Pager: [phone] Phone: [phone] 11/17/2016 9:27 AM

## 2016-11-17 NOTE — Progress Notes (Signed)
Patient presented to office for re-initiation of Cimzia.  She has not had standing labs since 08/02/16.  She is due for standing labs today.  Lab orders placed.

## 2016-11-18 ENCOUNTER — Other Ambulatory Visit: Payer: Self-pay | Admitting: Internal Medicine

## 2016-11-20 ENCOUNTER — Ambulatory Visit (INDEPENDENT_AMBULATORY_CARE_PROVIDER_SITE_OTHER): Payer: Commercial Managed Care - PPO | Admitting: *Deleted

## 2016-11-20 VITALS — BP 145/91 | HR 67

## 2016-11-20 DIAGNOSIS — M0579 Rheumatoid arthritis with rheumatoid factor of multiple sites without organ or systems involvement: Secondary | ICD-10-CM | POA: Diagnosis not present

## 2016-11-20 MED ORDER — CERTOLIZUMAB PEGOL 2 X 200 MG/ML ~~LOC~~ KIT
400.0000 mg | PACK | Freq: Once | SUBCUTANEOUS | Status: AC
  Administered 2016-11-20: 400 mg via SUBCUTANEOUS

## 2016-11-20 NOTE — Progress Notes (Signed)
Labs normal.

## 2016-11-20 NOTE — Progress Notes (Signed)
Patient in office today for a cimzia injection. Patient tolerated injection well. Patient was monitored in the office for adverse reactions for 30 minutes. NO adverse reactions noted.   Administrations This Visit    Certolizumab Pegol KIT 400 mg    Admin Date 11/20/2016 Action Given Dose 400 mg Route Subcutaneous Administered By Carole Binning, LPN

## 2016-11-30 ENCOUNTER — Other Ambulatory Visit (HOSPITAL_COMMUNITY): Payer: Self-pay | Admitting: Obstetrics and Gynecology

## 2016-11-30 DIAGNOSIS — R110 Nausea: Secondary | ICD-10-CM

## 2016-11-30 DIAGNOSIS — R1084 Generalized abdominal pain: Secondary | ICD-10-CM

## 2016-12-06 ENCOUNTER — Ambulatory Visit (HOSPITAL_COMMUNITY): Payer: Commercial Managed Care - PPO

## 2016-12-06 ENCOUNTER — Encounter (HOSPITAL_COMMUNITY): Payer: Self-pay

## 2016-12-07 ENCOUNTER — Emergency Department (HOSPITAL_COMMUNITY): Payer: Commercial Managed Care - PPO

## 2016-12-07 ENCOUNTER — Emergency Department (HOSPITAL_COMMUNITY)
Admission: EM | Admit: 2016-12-07 | Discharge: 2016-12-07 | Disposition: A | Discharge status: Home / Self Care | Payer: Commercial Managed Care - PPO | Attending: Emergency Medicine | Admitting: Emergency Medicine

## 2016-12-07 ENCOUNTER — Telehealth: Payer: Self-pay | Admitting: Rheumatology

## 2016-12-07 ENCOUNTER — Encounter (HOSPITAL_COMMUNITY): Payer: Self-pay | Admitting: Emergency Medicine

## 2016-12-07 DIAGNOSIS — N83202 Unspecified ovarian cyst, left side: Secondary | ICD-10-CM | POA: Diagnosis not present

## 2016-12-07 DIAGNOSIS — R109 Unspecified abdominal pain: Secondary | ICD-10-CM | POA: Diagnosis present

## 2016-12-07 DIAGNOSIS — I10 Essential (primary) hypertension: Secondary | ICD-10-CM | POA: Diagnosis not present

## 2016-12-07 DIAGNOSIS — K439 Ventral hernia without obstruction or gangrene: Secondary | ICD-10-CM

## 2016-12-07 DIAGNOSIS — Z79899 Other long term (current) drug therapy: Secondary | ICD-10-CM | POA: Diagnosis not present

## 2016-12-07 LAB — URINALYSIS, ROUTINE W REFLEX MICROSCOPIC
BILIRUBIN URINE: NEGATIVE
GLUCOSE, UA: NEGATIVE mg/dL
HGB URINE DIPSTICK: NEGATIVE
KETONES UR: NEGATIVE mg/dL
Leukocytes, UA: NEGATIVE
NITRITE: NEGATIVE
PH: 8 (ref 5.0–8.0)
Protein, ur: NEGATIVE mg/dL
SPECIFIC GRAVITY, URINE: 1.013 (ref 1.005–1.030)

## 2016-12-07 LAB — COMPREHENSIVE METABOLIC PANEL
ALT: 17 U/L (ref 14–54)
AST: 21 U/L (ref 15–41)
Albumin: 4 g/dL (ref 3.5–5.0)
Alkaline Phosphatase: 61 U/L (ref 38–126)
Anion gap: 7 (ref 5–15)
BUN: 10 mg/dL (ref 6–20)
CALCIUM: 9.1 mg/dL (ref 8.9–10.3)
CHLORIDE: 103 mmol/L (ref 101–111)
CO2: 26 mmol/L (ref 22–32)
CREATININE: 0.9 mg/dL (ref 0.44–1.00)
Glucose, Bld: 108 mg/dL — ABNORMAL HIGH (ref 65–99)
Potassium: 4.1 mmol/L (ref 3.5–5.1)
Sodium: 136 mmol/L (ref 135–145)
TOTAL PROTEIN: 8.6 g/dL — AB (ref 6.5–8.1)
Total Bilirubin: 0.8 mg/dL (ref 0.3–1.2)

## 2016-12-07 LAB — CBC WITH DIFFERENTIAL/PLATELET
Basophils Absolute: 0 10*3/uL (ref 0.0–0.1)
Basophils Relative: 0 %
EOS PCT: 1 %
Eosinophils Absolute: 0.1 10*3/uL (ref 0.0–0.7)
HEMATOCRIT: 34.3 % — AB (ref 36.0–46.0)
Hemoglobin: 11.4 g/dL — ABNORMAL LOW (ref 12.0–15.0)
LYMPHS ABS: 1.6 10*3/uL (ref 0.7–4.0)
LYMPHS PCT: 21 %
MCH: 29.4 pg (ref 26.0–34.0)
MCHC: 33.2 g/dL (ref 30.0–36.0)
MCV: 88.4 fL (ref 78.0–100.0)
Monocytes Absolute: 0.6 10*3/uL (ref 0.1–1.0)
Monocytes Relative: 8 %
NEUTROS ABS: 5.2 10*3/uL (ref 1.7–7.7)
Neutrophils Relative %: 70 %
PLATELETS: 296 10*3/uL (ref 150–400)
RBC: 3.88 MIL/uL (ref 3.87–5.11)
RDW: 14.5 % (ref 11.5–15.5)
WBC: 7.6 10*3/uL (ref 4.0–10.5)

## 2016-12-07 LAB — LIPASE, BLOOD: LIPASE: 22 U/L (ref 11–51)

## 2016-12-07 LAB — I-STAT BETA HCG BLOOD, ED (MC, WL, AP ONLY)

## 2016-12-07 MED ORDER — FENTANYL CITRATE (PF) 100 MCG/2ML IJ SOLN
50.0000 ug | Freq: Once | INTRAMUSCULAR | Status: AC
  Administered 2016-12-07: 50 ug via INTRAVENOUS
  Filled 2016-12-07: qty 2

## 2016-12-07 MED ORDER — SODIUM CHLORIDE 0.9 % IV BOLUS (SEPSIS)
1000.0000 mL | Freq: Once | INTRAVENOUS | Status: AC
  Administered 2016-12-07: 1000 mL via INTRAVENOUS

## 2016-12-07 MED ORDER — ONDANSETRON HCL 4 MG/2ML IJ SOLN
4.0000 mg | Freq: Once | INTRAMUSCULAR | Status: AC
  Administered 2016-12-07: 4 mg via INTRAVENOUS
  Filled 2016-12-07: qty 2

## 2016-12-07 MED ORDER — HYDROCODONE-ACETAMINOPHEN 5-325 MG PO TABS: 1.0000 | ORAL_TABLET | ORAL | 0 refills | Status: DC | PRN

## 2016-12-07 MED ORDER — ONDANSETRON 4 MG PO TBDP: 4.0000 mg | ORAL_TABLET | Freq: Three times a day (TID) | ORAL | 0 refills | Status: DC | PRN

## 2016-12-07 MED ORDER — IOPAMIDOL (ISOVUE-300) INJECTION 61%
100.0000 mL | Freq: Once | INTRAVENOUS | Status: AC | PRN
  Administered 2016-12-07: 100 mL via INTRAVENOUS

## 2016-12-07 MED ORDER — IOPAMIDOL (ISOVUE-300) INJECTION 61%
30.0000 mL | Freq: Once | INTRAVENOUS | Status: AC | PRN
  Administered 2016-12-07: 30 mL via ORAL

## 2016-12-07 MED ORDER — IOPAMIDOL (ISOVUE-300) INJECTION 61%
INTRAVENOUS | Status: AC
  Administered 2016-12-07: 16:00:00
  Filled 2016-12-07: qty 30

## 2016-12-07 MED ORDER — OXYCODONE-ACETAMINOPHEN 5-325 MG PO TABS
1.0000 | ORAL_TABLET | Freq: Once | ORAL | Status: AC
  Administered 2016-12-07: 1 via ORAL
  Filled 2016-12-07: qty 1

## 2016-12-07 MED ORDER — IOPAMIDOL (ISOVUE-300) INJECTION 61%
INTRAVENOUS | Status: AC
  Administered 2016-12-07: 16:00:00
  Filled 2016-12-07: qty 100

## 2016-12-07 NOTE — Discharge Instructions (Signed)
I recommend that you follow-up with your GYN regarding the ovarian cysts or any other ongoing pelvic issues related to surgery. May follow-up with general surgery clinic regarding hernia repair if desired. Take vicodin for severe pain.  Recommend to continue motrin between doses.  Do not drive while taking pain medication. Follow-up with your primary care doctor as needed.   Return here for new concerns.

## 2016-12-07 NOTE — ED Notes (Signed)
Pt. Refused to have labs drawn. Pt stated " I don't want no needle in me". Nurse aware.

## 2016-12-07 NOTE — ED Triage Notes (Signed)
Per [facility] pt called from work due to severe abdominal pain onset 30 mins ago. Pt reports surgery to remove fibroids about 6-8 weeks ago and has been having complications. N/V onset 9 am today. Pt given 4mg  zofran en route.

## 2016-12-07 NOTE — ED Notes (Signed)
Bed: WA03 Expected date:  Expected time:  Means of arrival:  Comments: EMS-abdominal pain 

## 2016-12-07 NOTE — ED Provider Notes (Signed)
[facility] DEPT Provider Note   CSN: [id number] Arrival date & time: 12/07/16  1114     History   Chief Complaint Chief Complaint  Patient presents with  . Abdominal Pain    HPI ADIVA BOETTNER is a 41 y.o. female.  The history is provided by the patient and medical records.  Abdominal Pain   Associated symptoms include nausea and vomiting.   41 year old female with history of fibroids, hypertension, migraine headaches, rheumatoid arthritis, presenting to the ED for abdominal pain. Patient had ex-lap in Dec 2017 for her uterine fibroids. States she has some complications with bleeding afterwards and had a JP drain in place for a few days. States since her initial surgery she has had some intermittent lower abdominal pain. She has seen her surgeon as well as her regular OB/GYN about this and has been told it is scar tissue versus a new hernia. Patient reports today at work she had increasing pain as well as some nausea and vomiting. She's not had any fever or chills. Reports she felt constipated a few days ago but took a stool softener and had a large bowel movement. She's not had any other prior abdominal surgeries.  Past Medical History:  Diagnosis Date  . Acute otitis media 08/28/2012   improved  change to liquid medication   . Anemia   . Breast abscess of female    Recurrent  . Fibroids    w/bleeding  . Headache(784.0)    severe migraines  . Hypertension   . Migraines    Dr. Orie Rout  . Pill esophagitis 08/28/2012   amoxicillin  by hx  disc plan nl voice except hoarse not drooling  close fu  if not getting better with plan stop aleve  liquid ibu onlyf necessary   . Recurrent periodic urticaria   . Rheumatoid arthritis [facility])     Patient Active Problem List   Diagnosis Date Noted  . Leiomyoma of uterus 09/06/2016  . Fibroid, uterine   . Essential hypertension 05/25/2015  . Recurrent headache 05/25/2015  . Head lump 07/31/2014  . Edema 12/29/2013  .  Baker's cyst, ruptured 12/25/2013  . Cough, persistent 12/12/2013  . Left leg swelling 12/12/2013  . Cough 12/12/2013  . High risk medication use 12/12/2013  . Recurrent periodic urticaria   . Rheumatoid arthritis ([place]) 08/28/2012  . CHRONIC LARYNGITIS 11/03/2010  . ALLERGIC RHINITIS 11/03/2010  . PARESTHESIA 07/28/2010  . DYSMENORRHEA 10/15/2007  . FIBROIDS, UTERUS 07/24/2007  . OBESITY 07/24/2007  . HYPERTENSION 07/24/2007  . SLEEPLESSNESS 07/24/2007  . HEADACHE 07/24/2007    Past Surgical History:  Procedure Laterality Date  . breast abscess  2003   removal  . CYST EXCISION Left    arm  . MYOMECTOMY N/A 09/06/2016   Procedure: ABOMINAL MYOMECTOMY;  Surgeon: Governor Specking, MD;  Location: [facility];  Service: Gynecology;  Laterality: N/A;    OB History    No data available       Home Medications    Prior to Admission medications   Medication Sig Start Date End Date Taking? Authorizing Provider  Certolizumab Pegol (CIMZIA PREFILLED) 2 X 200 MG/ML KIT Inject 1 kit into the skin every 28 (twenty-eight) days.   Yes Historical Provider, MD  ibuprofen (ADVIL,MOTRIN) 800 MG tablet Take 400-800 mg by mouth 3 (three) times daily as needed for pain. 12/04/16  Yes Historical Provider, MD  iron polysaccharides (FERREX 150) 150 MG capsule Take 1 capsule (150 mg total) by mouth daily. Patient  taking differently: Take 150 mg by mouth every other day.  05/24/16  Yes Burnis Medin, MD  labetalol (NORMODYNE) 200 MG tablet Take 200 mg by mouth 3 (three) times daily.   Yes Historical Provider, MD  traMADol (ULTRAM) 50 MG tablet Take 50 mg by mouth daily as needed for pain. 12/05/16  Yes Historical Provider, MD  labetalol (NORMODYNE) 200 MG tablet TAKE 1 TABLET TWICE A DAY INCREASE TO 300MG TWICE DAILY THEN 400MG TWICE DAILY AS DIRECTED Patient not taking: Reported on 12/07/2016 11/20/16   Burnis Medin, MD    Family History Family History  Problem Relation Age of Onset  . Hypertension  Mother   . Rheum arthritis Mother   . Hypertension Father     Social History Social History  Substance Use Topics  . Smoking status: Never Smoker  . Smokeless tobacco: Never Used  . Alcohol use 1.2 oz/week    2 Standard drinks or equivalent per week     Allergies   Patient has no known allergies.   Review of Systems Review of Systems  Gastrointestinal: Positive for abdominal pain, nausea and vomiting.  All other systems reviewed and are negative.    Physical Exam Updated Vital Signs BP (!) 158/99 (BP Location: Right Arm)   Pulse 69   Temp 98.4 F (36.9 C) (Oral)   Resp 17   Ht 5' 4"  (1.626 m)   Wt 117.9 kg   LMP 11/25/2016   SpO2 100%   BMI 44.63 kg/m   Physical Exam  Constitutional: She is oriented to person, place, and time. She appears well-developed and well-nourished.  HENT:  Head: Normocephalic and atraumatic.  Mouth/Throat: Oropharynx is clear and moist.  Eyes: Conjunctivae and EOM are normal. Pupils are equal, round, and reactive to light.  Neck: Normal range of motion.  Cardiovascular: Normal rate, regular rhythm and normal heart sounds.   Pulmonary/Chest: Effort normal and breath sounds normal.  Abdominal: Soft. Bowel sounds are normal. There is generalized tenderness.  Suprapubic incision is well-healed without signs of infection, there is a ventral wall hernia that is easily reducible, there are no signs of incarceration currently Abdomen is diffusely tender without peritonitis  Musculoskeletal: Normal range of motion.  Neurological: She is alert and oriented to person, place, and time.  Skin: Skin is warm and dry.  Psychiatric: She has a normal mood and affect.  Nursing note and vitals reviewed.    ED Treatments / Results  Labs (all labs ordered are listed, but only abnormal results are displayed) Labs Reviewed  CBC WITH DIFFERENTIAL/PLATELET - Abnormal; Notable for the following:       Result Value   Hemoglobin 11.4 (*)    HCT 34.3 (*)     All other components within normal limits  COMPREHENSIVE METABOLIC PANEL - Abnormal; Notable for the following:    Glucose, Bld 108 (*)    Total Protein 8.6 (*)    All other components within normal limits  LIPASE, BLOOD  URINALYSIS, ROUTINE W REFLEX MICROSCOPIC  I-STAT BETA HCG BLOOD, ED ([facility], [facility], [facility] ONLY)    EKG  EKG Interpretation None       Radiology Ct Abdomen Pelvis W Contrast  Result Date: 12/07/2016 CLINICAL DATA:  Abdominal pain EXAM: CT ABDOMEN AND PELVIS WITH CONTRAST TECHNIQUE: Multidetector CT imaging of the abdomen and pelvis was performed using the standard protocol following bolus administration of intravenous contrast. CONTRAST:  161m ISOVUE-300 IOPAMIDOL (ISOVUE-300) INJECTION 61% COMPARISON:  MRI pelvis 06/29/2015 FINDINGS: Lower  chest: Lung bases are clear. No effusions. Heart is normal size. Small hiatal hernia Hepatobiliary: No focal hepatic abnormality. Gallbladder unremarkable. Pancreas: No focal abnormality or ductal dilatation. Spleen: No focal abnormality.  Normal size. Adrenals/Urinary Tract: No adrenal abnormality. No focal renal abnormality. No stones or hydronephrosis. Urinary bladder is unremarkable. Stomach/Bowel: Normal appendix. A few scattered left colonic diverticula. No active diverticulitis. Stomach and small bowel decompressed. Several small bowel loops are noted in a lower pelvic midline ventral hernia along with fluid/ascites. This is new since prior MRI. Vascular/Lymphatic: No evidence of aneurysm or adenopathy. Reproductive: There appears to been prior myomectomies of multiple uterine fibroids seen on prior pelvic MRI. There are irregular low-density areas within the uterus, which could reflect postoperative fluid collection/hematoma or residual fibroids. There is a 4.2 cm left ovarian cyst. Right adnexa unremarkable. Other: Small amount of free fluid in the cul-de-sac of the pelvis and in the lower pelvic abdominal wall ventral hernia sac.  Musculoskeletal: No acute bony abnormality. IMPRESSION: There are irregular low-density areas within the uterus, possibly related to recent myomectomy. This could be further evaluated with pelvic ultrasound or MRI may. 4.2 cm left ovarian cyst. Midline lower pelvic wall ventral hernia containing several small bowel loops and ascites. No evidence of bowel obstruction. This appears new since prior MRI. Small to moderate ascites in the cul-de-sac of the pelvis as well. Electronically Signed   By: Rolm Baptise M.D.   On: 12/07/2016 14:10    Procedures Procedures (including critical care time)  Medications Ordered in ED Medications  iopamidol (ISOVUE-300) 61 % injection (not administered)  iopamidol (ISOVUE-300) 61 % injection (not administered)  oxyCODONE-acetaminophen (PERCOCET/ROXICET) 5-325 MG per tablet 1 tablet (not administered)  sodium chloride 0.9 % bolus 1,000 mL (0 mLs Intravenous Stopped 12/07/16 1342)  fentaNYL (SUBLIMAZE) injection 50 mcg (50 mcg Intravenous Given 12/07/16 1227)  ondansetron (ZOFRAN) injection 4 mg (4 mg Intravenous Given 12/07/16 1227)  iopamidol (ISOVUE-300) 61 % injection 30 mL (30 mLs Oral Contrast Given 12/07/16 1210)  iopamidol (ISOVUE-300) 61 % injection 100 mL (100 mLs Intravenous Contrast Given 12/07/16 1347)     Initial Impression / Assessment and Plan / ED Course  I have reviewed the triage vital signs and the nursing notes.  Pertinent labs & imaging results that were available during my care of the patient were reviewed by me and considered in my medical decision making (see chart for details).  41 year old female here with abdominal pain. Recent ex-lap in December 2017 due to fibroids.  Reports some intermittent pain since.  She is afebrile and nontoxic. She has generalized tenderness without peritonitis. She does have a ventral hernia that is easily reducible, no signs of incarceration currently. Patient screening labwork is overall reassuring. CT scan  obtained confirming hernia with loops of bowel, however no signs of incarceration or obstruction. She also has a left ovarian cyst. There are some uterine changes, possibly related to her recent myomectomy.  After IV fluids, pain medication, and Zofran patient is feeling better. She is not had any active emesis here in ED. Her hernia remains easily reducible. I discussed her CT results with her and her husband at the bedside at length. They acknowledged understanding. I recommended she follow-up closely with her OB/GYN regarding the ovarian cysts and any further surgical complications should they arise. I've referred her to general surgery for consultation regarding hernia repair scar.  Short supply pain medication given.  Discussed plan with patient, she acknowledged understanding and agreed with plan of  care.  Return precautions given for new or worsening symptoms.  Final Clinical Impressions(s) / ED Diagnoses   Final diagnoses:  Ventral hernia without obstruction or gangrene  Cyst of left ovary    New Prescriptions New Prescriptions   HYDROCODONE-ACETAMINOPHEN (NORCO/VICODIN) 5-325 MG TABLET    Take 1 tablet by mouth every 4 (four) hours as needed.   ONDANSETRON (ZOFRAN ODT) 4 MG DISINTEGRATING TABLET    Take 1 tablet (4 mg total) by mouth every 8 (eight) hours as needed for nausea.     Larene Pickett, PA-C 12/07/16 [place], DO 12/07/16 [phone]

## 2016-12-07 NOTE — Telephone Encounter (Signed)
Rx Cimzia will be delivered 27th of March.

## 2016-12-07 NOTE — ED Notes (Signed)
Patient transported to CT 

## 2016-12-08 ENCOUNTER — Encounter: Payer: Self-pay | Admitting: Internal Medicine

## 2016-12-08 ENCOUNTER — Ambulatory Visit (INDEPENDENT_AMBULATORY_CARE_PROVIDER_SITE_OTHER): Payer: Commercial Managed Care - PPO | Admitting: Internal Medicine

## 2016-12-08 VITALS — BP 130/90 | HR 59 | Temp 97.5°F | Ht 64.0 in | Wt 265.3 lb

## 2016-12-08 DIAGNOSIS — R109 Unspecified abdominal pain: Secondary | ICD-10-CM

## 2016-12-08 DIAGNOSIS — K432 Incisional hernia without obstruction or gangrene: Secondary | ICD-10-CM

## 2016-12-08 NOTE — Patient Instructions (Addendum)
I agree this is  an incisional hernia  And dont know why it is called ventral .    And may be causing the abdominal pain .    The upper pain . Could be related but   hard to tell cause of the hernia  going in an out . Gall stone   Can be missed on a ct  But reassuring at this. Time if recurring may consider [place] .A surgical consult.   Agree  .   If pain again to ED again .    Ovarian cyst should be followed up the  Gyne  Dr Radene Knee  but doesn't seem to be the cause of pain .   Take ranitidine 150 twice a day in the interim incase there is a gastritis involved also.   Lab Results  Component Value Date   WBC 7.6 12/07/2016   HGB 11.4 (L) 12/07/2016   HCT 34.3 (L) 12/07/2016   PLT 296 12/07/2016   GLUCOSE 108 (H) 12/07/2016   ALT 17 12/07/2016   AST 21 12/07/2016   NA 136 12/07/2016   K 4.1 12/07/2016   CL 103 12/07/2016   CREATININE 0.90 12/07/2016   BUN 10 12/07/2016   CO2 26 12/07/2016   INR 1.20 09/07/2016

## 2016-12-08 NOTE — Progress Notes (Signed)
 Chief Complaint  Patient presents with  . Incisional Hernia    diagnosis last week/ 1-10 pain is an 8     HPI: Jennifer [name] 41 y.o.  sda seen in ed for abd pain 2 days ago  And   Has "ventral hernia  Dx "     history of fibroids, hypertension, migraine headaches, rheumatoid arthritis,  Patient had ex-lap in Dec 2017 for her uterine fibroids. Had ct scan  And iv fluida and told to gfyu with gyne    Jennifer [name]  Has confusion about communication of what is causing her problems .  she describes being at work and having a subacute onset of mid epigastric pain going all the way down to her incision site. She was told she had an incisional hernia by Dr. McComb  then confirmed by her surgeon GYN. Originally diagnosed as scar tissue. She states she had fibroid surgery with removal of 35 small fibroids on 09/06/2016 required a transfusion after she stood up fainted backwards and opened the incisional site with the surgeon had to be stapled was wound. Since that time she had noted a bulge or tenderness on the left lower part of the incision and her surgeon thought it was scar tissue. However Dr. McComb felt that it was an incisional hernia. 2 days ago when she went to the emergency rim she had vomiting and severe abdominal pain was given a CT scan IV fluids and something for nausea and pain. At the time of the CT her pain was down had been medicated. She states that no one examined her abdomen. So cannot tell me if her pain was localized to the left lower quadrant where her hernia is. At the time of the CT. There was also a new ovarian cyst that was noted on the left. She has an appointment with surgery in about 2-3 weeks. She feels that it is still sore at times doesn't know what to do at this time and needs more information. ROS: See pertinent positives and negatives per HPI.  Past Medical History:  Diagnosis Date  . Acute otitis media 08/28/2012   improved  change to liquid medication   . Anemia     . Breast abscess of female    Recurrent  . Fibroids    w/bleeding  . Headache(784.0)    severe migraines  . Hypertension   . Migraines    Dr. Marshall Freeman  . Pill esophagitis 08/28/2012   amoxicillin  by hx  disc plan nl voice except hoarse not drooling  close fu  if not getting better with plan stop aleve  liquid ibu onlyf necessary   . Recurrent periodic urticaria   . Rheumatoid arthritis ([facility])     Family History  Problem Relation Age of Onset  . Hypertension Mother   . Rheum arthritis Mother   . Hypertension Father     Social History   Social History  . Marital status: Significant Other    Spouse name: N/A  . Number of children: N/A  . Years of education: N/A   Social History Main Topics  . Smoking status: Never Smoker  . Smokeless tobacco: Never Used  . Alcohol use 1.2 oz/week    2 Standard drinks or equivalent per week  . Drug use: No  . Sexual activity: Yes    Birth control/ protection: None   Other Topics Concern  . None   Social History Narrative  . None    Outpatient Medications   Prior to Visit  Medication Sig Dispense Refill  . Certolizumab Pegol (CIMZIA PREFILLED) 2 X 200 MG/ML KIT Inject 1 kit into the skin every 28 (twenty-eight) days.    . HYDROcodone-acetaminophen (NORCO/VICODIN) 5-325 MG tablet Take 1 tablet by mouth every 4 (four) hours as needed. 12 tablet 0  . ibuprofen (ADVIL,MOTRIN) 800 MG tablet Take 400-800 mg by mouth 3 (three) times daily as needed for pain.    . iron polysaccharides (FERREX 150) 150 MG capsule Take 1 capsule (150 mg total) by mouth daily. (Patient taking differently: Take 150 mg by mouth every other day. ) 30 capsule 5  . labetalol (NORMODYNE) 200 MG tablet TAKE 1 TABLET TWICE A DAY INCREASE TO 300MG TWICE DAILY THEN 400MG TWICE DAILY AS DIRECTED 120 tablet 3  . labetalol (NORMODYNE) 200 MG tablet Take 200 mg by mouth 3 (three) times daily.    . ondansetron (ZOFRAN ODT) 4 MG disintegrating tablet Take 1 tablet (4 mg  total) by mouth every 8 (eight) hours as needed for nausea. 10 tablet 0  . traMADol (ULTRAM) 50 MG tablet Take 50 mg by mouth daily as needed for pain.     No facility-administered medications prior to visit.      EXAM:  BP 130/90 (BP Location: Right Arm, Patient Position: Sitting, Cuff Size: Normal)   Pulse (!) 59   Temp 97.5 F (36.4 C) (Oral)   Ht 5' 4" (1.626 m)   Wt 265 lb 4.8 oz (120.3 kg)   LMP 11/25/2016   BMI 45.54 kg/m   Body mass index is 45.54 kg/m.  GENERAL: vitals reviewed and listed above, alert, oriented, appears well hydrated and in no acute distress HEENT: atraumatic, conjunctiva  clear, no obvious abnormalities on inspection of external nose and ears NECK: no obvious masses on inspection palpation  LUNGS: clear to auscultation bilaterally, no wheezes, rales or rhonchi, good air movement CV: HRRR, no clubbing cyanosis or  peripheral edema nl cap refill  Abdomen shows well-healed suprapubic incision with obvious hernia about 3-4 cm on her left side mildly tender but not tense and is reducible. I don't feel a ventral hernia but she does have a large abdomen mild epigastric tenderness no masses or rebound. She's able to walk without difficulty. MS: moves all extremities  PSYCH: pleasant and cooperative, no obvious depression or anxiety Reviewed ED notes labs and CT scan report. IMPRESSION: There are irregular low-density areas within the uterus, possibly related to recent myomectomy. This could be further evaluated with pelvic ultrasound or MRI may.  4.2 cm left ovarian cyst.  Midline lower pelvic wall ventral hernia containing several small bowel loops and ascites. No evidence of bowel obstruction. This appears new since prior MRI.  Small to moderate ascites in the cul-de-sac of the pelvis as well.   Electronically Signed   By: Kevin  Dover M.D.   On: 12/07/2016 14:10  ASSESSMENT AND PLAN:  Discussed the following assessment and  plan:  Incisional hernia without obstruction or gangrene - clincally but called bental on ct scan  tnder  ? if intermittent incarderation/ causing sx? or other   Abdominal pain, unspecified abdominal location - episodic  with vomiting and severity   Abdominal ultrasound was had previously ordered to consider checking for gallstones. Although less likely with epigastric pain and vomiting is reasonable to proceed because of the confusion about the cause of her recent pain. By exam she definitely has an incisional hernia scan states that the ventral. Definite follow 3 with   a surgical opinion and discussed options. If she gets worse she should go to the emergency room again discussed indications for emergency evaluation of her hernia. I agree communication and diagnosis is confusing at this time. -Patient advised to return or notify health care team  if symptoms worsen ,persist or new concerns arise.  Patient Instructions   I agree this is  an incisional hernia  And dont know why it is called ventral .    And may be causing the abdominal pain .    The upper pain . Could be related but   hard to tell cause of the hernia  going in an out . Gall stone   Can be missed on a ct  But reassuring at this. Time if recurring may consider us .A surgical consult.   Agree  .   If pain again to ED again .    Ovarian cyst should be followed up the  Gyne  Dr Mccomb  but doesn't seem to be the cause of pain .   Take ranitidine 150 twice a day in the interim incase there is a gastritis involved also.   Lab Results  Component Value Date   WBC 7.6 12/07/2016   HGB 11.4 (L) 12/07/2016   HCT 34.3 (L) 12/07/2016   PLT 296 12/07/2016   GLUCOSE 108 (H) 12/07/2016   ALT 17 12/07/2016   AST 21 12/07/2016   NA 136 12/07/2016   K 4.1 12/07/2016   CL 103 12/07/2016   CREATININE 0.90 12/07/2016   BUN 10 12/07/2016   CO2 26 12/07/2016   INR 1.20 09/07/2016        K.  M.D. 

## 2016-12-11 ENCOUNTER — Encounter: Payer: Self-pay | Admitting: Internal Medicine

## 2016-12-11 NOTE — Telephone Encounter (Signed)
I spoke to patient.  She forgot she already had a Cimzia in the refrigerator at the office.  Most recent Cimzia was 11/20/16.  She is due again 12/18/16, but the office is closed that day.  She scheduled her appointment for 12/20/16 at 10:00 AM for nurse visit for Cimzia.

## 2016-12-12 ENCOUNTER — Other Ambulatory Visit: Payer: Self-pay | Admitting: Emergency Medicine

## 2016-12-12 DIAGNOSIS — K439 Ventral hernia without obstruction or gangrene: Secondary | ICD-10-CM

## 2016-12-12 NOTE — Progress Notes (Signed)
Received a delivery of Cimzia for patient from [facility]. Medication was put in fridge.   Jennifer [name], [place], CPhT 10:25 AM

## 2016-12-13 ENCOUNTER — Ambulatory Visit (HOSPITAL_COMMUNITY): Payer: Commercial Managed Care - PPO

## 2016-12-13 ENCOUNTER — Ambulatory Visit: Payer: Self-pay | Admitting: General Surgery

## 2016-12-15 ENCOUNTER — Encounter (HOSPITAL_COMMUNITY): Payer: Self-pay

## 2016-12-15 ENCOUNTER — Encounter (HOSPITAL_COMMUNITY)
Admission: RE | Admit: 2016-12-15 | Discharge: 2016-12-15 | Disposition: A | Discharge status: Home / Self Care | Payer: Commercial Managed Care - PPO | Source: Ambulatory Visit | Attending: General Surgery | Admitting: General Surgery

## 2016-12-15 DIAGNOSIS — K432 Incisional hernia without obstruction or gangrene: Secondary | ICD-10-CM | POA: Diagnosis not present

## 2016-12-15 DIAGNOSIS — Z01812 Encounter for preprocedural laboratory examination: Secondary | ICD-10-CM | POA: Insufficient documentation

## 2016-12-15 HISTORY — DX: Gastro-esophageal reflux disease without esophagitis: K21.9

## 2016-12-15 HISTORY — DX: Family history of other specified conditions: Z84.89

## 2016-12-15 LAB — CBC
HCT: 34.9 % — ABNORMAL LOW (ref 36.0–46.0)
Hemoglobin: 11.3 g/dL — ABNORMAL LOW (ref 12.0–15.0)
MCH: 28.8 pg (ref 26.0–34.0)
MCHC: 32.4 g/dL (ref 30.0–36.0)
MCV: 89 fL (ref 78.0–100.0)
Platelets: 244 10*3/uL (ref 150–400)
RBC: 3.92 MIL/uL (ref 3.87–5.11)
RDW: 14.2 % (ref 11.5–15.5)
WBC: 4.6 10*3/uL (ref 4.0–10.5)

## 2016-12-15 LAB — BASIC METABOLIC PANEL
Anion gap: 8 (ref 5–15)
BUN: 9 mg/dL (ref 6–20)
CHLORIDE: 104 mmol/L (ref 101–111)
CO2: 24 mmol/L (ref 22–32)
CREATININE: 0.91 mg/dL (ref 0.44–1.00)
Calcium: 8.8 mg/dL — ABNORMAL LOW (ref 8.9–10.3)
GFR calc Af Amer: >60 mL/min (ref >60)
GFR calc non Af Amer: >60 mL/min (ref >60)
GLUCOSE: 111 mg/dL — AB (ref 65–99)
Potassium: 3.6 mmol/L (ref 3.5–5.1)
Sodium: 136 mmol/L (ref 135–145)

## 2016-12-15 LAB — HCG, SERUM, QUALITATIVE: PREG SERUM: NEGATIVE

## 2016-12-15 MED ORDER — DEXTROSE 5 % IV SOLN
3.0000 g | INTRAVENOUS | Status: AC
  Administered 2016-12-18: 3 g via INTRAVENOUS
  Filled 2016-12-15: qty 3000

## 2016-12-15 NOTE — Pre-Procedure Instructions (Signed)
Jennifer [name]  12/15/2016      [id number] - [place], [place] [address] Phone: [phone] Fax: [phone]    Your procedure is scheduled on *12/18/16  Report to [facility] at 1220 A.M.  Call this number if you have problems the morning of surgery:  [phone]   Remember:  Do not eat food or drink liquids after midnight.  Take these medicines the morning of surgery with A SIP OF WATER    labatalol, ranitadine, pain med if needed  STOP all herbel meds, nsaids (aleve,naproxen,advil,ibuprofen)   Do not wear jewelry, make-up or nail polish.  Do not wear lotions, powders, or perfumes, or deoderant.  Do not shave 48 hours prior to surgery.  Men may shave face and neck.  Do not bring valuables to the hospital.  [facility] is not responsible for any belongings or valuables.  Contacts, dentures or bridgework may not be worn into surgery.  Leave your suitcase in the car.  After surgery it may be brought to your room.  For patients admitted to the hospital, discharge time will be determined by your treatment team.  Patients discharged the day of surgery will not be allowed to drive home.   Special instructions:   Special Instructions: [facility] - Preparing for Surgery  Before surgery, you can play an important role.  Because skin is not sterile, your skin needs to be as free of germs as possible.  You can reduce the number of germs on you skin by washing with CHG (chlorahexidine gluconate) soap before surgery.  CHG is an antiseptic cleaner which kills germs and bonds with the skin to continue killing germs even after washing.  Please DO NOT use if you have an allergy to CHG or antibacterial soaps.  If your skin becomes reddened/irritated stop using the CHG and inform your nurse when you arrive at Short Stay.  Do not shave (including legs and underarms) for at least 48 hours prior to the first CHG  shower.  You may shave your face.  Please follow these instructions carefully:   1.  Shower with CHG Soap the night before surgery and the morning of Surgery.  2.  If you choose to wash your hair, wash your hair first as usual with your normal shampoo.  3.  After you shampoo, rinse your hair and body thoroughly to remove the Shampoo.  4.  Use CHG as you would any other liquid soap.  You can apply chg directly  to the skin and wash gently with scrungie or a clean washcloth.  5.  Apply the CHG Soap to your body ONLY FROM THE NECK DOWN.  Do not use on open wounds or open sores.  Avoid contact with your eyes ears, mouth and genitals (private parts).  Wash genitals (private parts)       with your normal soap.  6.  Wash thoroughly, paying special attention to the area where your surgery will be performed.  7.  Thoroughly rinse your body with warm water from the neck down.  8.  DO NOT shower/wash with your normal soap after using and rinsing off the CHG Soap.  9.  Pat yourself dry with a clean towel.            10.  Wear clean pajamas.            11.  Place clean sheets on your bed the night  of your first shower and do not sleep with pets.  Day of Surgery  Do not apply any lotions/deodorants the morning of surgery.  Please wear clean clothes to the hospital/surgery center.  Please read over the following fact sheets that you were given.

## 2016-12-18 ENCOUNTER — Ambulatory Visit (HOSPITAL_COMMUNITY): Payer: Commercial Managed Care - PPO | Admitting: Anesthesiology

## 2016-12-18 ENCOUNTER — Encounter (HOSPITAL_COMMUNITY): Payer: Self-pay | Admitting: Urology

## 2016-12-18 ENCOUNTER — Observation Stay (HOSPITAL_COMMUNITY)
Admission: RE | Admit: 2016-12-18 | Discharge: 2016-12-21 | Disposition: A | Discharge status: Home / Self Care | Payer: Commercial Managed Care - PPO | Source: Ambulatory Visit | Attending: General Surgery | Admitting: General Surgery

## 2016-12-18 ENCOUNTER — Encounter (HOSPITAL_COMMUNITY)
Admission: RE | Disposition: A | Discharge status: Home / Self Care | Payer: Self-pay | Source: Ambulatory Visit | Attending: General Surgery

## 2016-12-18 DIAGNOSIS — Z79899 Other long term (current) drug therapy: Secondary | ICD-10-CM | POA: Insufficient documentation

## 2016-12-18 DIAGNOSIS — K432 Incisional hernia without obstruction or gangrene: Principal | ICD-10-CM | POA: Diagnosis present

## 2016-12-18 DIAGNOSIS — Z6841 Body Mass Index (BMI) 40.0 and over, adult: Secondary | ICD-10-CM | POA: Diagnosis not present

## 2016-12-18 DIAGNOSIS — D649 Anemia, unspecified: Secondary | ICD-10-CM | POA: Insufficient documentation

## 2016-12-18 DIAGNOSIS — R338 Other retention of urine: Secondary | ICD-10-CM | POA: Diagnosis not present

## 2016-12-18 DIAGNOSIS — M1990 Unspecified osteoarthritis, unspecified site: Secondary | ICD-10-CM | POA: Insufficient documentation

## 2016-12-18 DIAGNOSIS — N9989 Other postprocedural complications and disorders of genitourinary system: Secondary | ICD-10-CM | POA: Diagnosis not present

## 2016-12-18 DIAGNOSIS — I10 Essential (primary) hypertension: Secondary | ICD-10-CM | POA: Diagnosis not present

## 2016-12-18 HISTORY — PX: INCISIONAL HERNIA REPAIR: SHX193

## 2016-12-18 HISTORY — PX: INSERTION OF MESH: SHX5868

## 2016-12-18 LAB — CBC
HCT: 34.9 % — ABNORMAL LOW (ref 36.0–46.0)
HEMOGLOBIN: 11.7 g/dL — AB (ref 12.0–15.0)
MCH: 29.5 pg (ref 26.0–34.0)
MCHC: 33.5 g/dL (ref 30.0–36.0)
MCV: 88.1 fL (ref 78.0–100.0)
PLATELETS: 242 10*3/uL (ref 150–400)
RBC: 3.96 MIL/uL (ref 3.87–5.11)
RDW: 13.9 % (ref 11.5–15.5)
WBC: 4.9 10*3/uL (ref 4.0–10.5)

## 2016-12-18 LAB — CREATININE, SERUM
Creatinine, Ser: 0.87 mg/dL (ref 0.44–1.00)
GFR calc non Af Amer: >60 mL/min (ref >60)

## 2016-12-18 SURGERY — REPAIR, HERNIA, INCISIONAL
Anesthesia: General | Site: Abdomen

## 2016-12-18 MED ORDER — FENTANYL CITRATE (PF) 100 MCG/2ML IJ SOLN: INTRAMUSCULAR | Status: DC | PRN

## 2016-12-18 MED ORDER — LIDOCAINE HCL (CARDIAC) 20 MG/ML IV SOLN
INTRAVENOUS | Status: DC | PRN
  Administered 2016-12-18: 80 mg via INTRAVENOUS

## 2016-12-18 MED ORDER — DOXEPIN HCL 10 MG PO CAPS: 10.0000 mg | ORAL_CAPSULE | Freq: Two times a day (BID) | ORAL | Status: DC | PRN

## 2016-12-18 MED ORDER — ESMOLOL HCL 100 MG/10ML IV SOLN
INTRAVENOUS | Status: DC | PRN
  Administered 2016-12-18: 20 mg via INTRAVENOUS

## 2016-12-18 MED ORDER — FENTANYL CITRATE (PF) 100 MCG/2ML IJ SOLN
INTRAMUSCULAR | Status: DC | PRN
  Administered 2016-12-18: 100 ug via INTRAVENOUS
  Administered 2016-12-18 (×3): 50 ug via INTRAVENOUS

## 2016-12-18 MED ORDER — ONDANSETRON 4 MG PO TBDP
4.0000 mg | ORAL_TABLET | Freq: Three times a day (TID) | ORAL | Status: DC | PRN
  Administered 2016-12-19: 4 mg via ORAL
  Filled 2016-12-18: qty 1

## 2016-12-18 MED ORDER — KCL IN DEXTROSE-NACL 20-5-0.45 MEQ/L-%-% IV SOLN
INTRAVENOUS | Status: DC
  Administered 2016-12-18: 1 mL via INTRAVENOUS
  Filled 2016-12-18: qty 1000

## 2016-12-18 MED ORDER — ONDANSETRON HCL 4 MG/2ML IJ SOLN
INTRAMUSCULAR | Status: DC | PRN
  Administered 2016-12-18: 4 mg via INTRAVENOUS

## 2016-12-18 MED ORDER — ESMOLOL HCL 100 MG/10ML IV SOLN
INTRAVENOUS | Status: AC
  Filled 2016-12-18: qty 10

## 2016-12-18 MED ORDER — ENOXAPARIN SODIUM 40 MG/0.4ML ~~LOC~~ SOLN
40.0000 mg | SUBCUTANEOUS | Status: DC
  Administered 2016-12-19 – 2016-12-20 (×2): 40 mg via SUBCUTANEOUS
  Filled 2016-12-18 (×2): qty 0.4

## 2016-12-18 MED ORDER — ACETAMINOPHEN 325 MG PO TABS: 650.0000 mg | ORAL_TABLET | Freq: Four times a day (QID) | ORAL | Status: DC | PRN

## 2016-12-18 MED ORDER — PROMETHAZINE HCL 25 MG/ML IJ SOLN
6.2500 mg | INTRAMUSCULAR | Status: DC | PRN
  Administered 2016-12-18: 6.25 mg via INTRAVENOUS

## 2016-12-18 MED ORDER — MIDAZOLAM HCL 2 MG/2ML IJ SOLN
INTRAMUSCULAR | Status: AC
  Filled 2016-12-18: qty 2

## 2016-12-18 MED ORDER — 0.9 % SODIUM CHLORIDE (POUR BTL) OPTIME
TOPICAL | Status: DC | PRN
  Administered 2016-12-18: 1000 mL

## 2016-12-18 MED ORDER — BUPIVACAINE HCL (PF) 0.25 % IJ SOLN
INTRAMUSCULAR | Status: DC | PRN
  Administered 2016-12-18: 30 mL

## 2016-12-18 MED ORDER — OXYCODONE HCL 5 MG PO TABS: 5.0000 mg | ORAL_TABLET | Freq: Once | ORAL | Status: DC | PRN

## 2016-12-18 MED ORDER — LACTATED RINGERS IV SOLN
INTRAVENOUS | Status: DC
  Administered 2016-12-18 (×2): via INTRAVENOUS

## 2016-12-18 MED ORDER — ALBUTEROL SULFATE HFA 108 (90 BASE) MCG/ACT IN AERS
INHALATION_SPRAY | RESPIRATORY_TRACT | Status: DC | PRN
  Administered 2016-12-18: 2 via RESPIRATORY_TRACT

## 2016-12-18 MED ORDER — BUPIVACAINE HCL (PF) 0.25 % IJ SOLN
INTRAMUSCULAR | Status: AC
  Filled 2016-12-18: qty 30

## 2016-12-18 MED ORDER — PROMETHAZINE HCL 25 MG/ML IJ SOLN
INTRAMUSCULAR | Status: AC
  Administered 2016-12-18: 6.25 mg via INTRAVENOUS
  Filled 2016-12-18: qty 1

## 2016-12-18 MED ORDER — LACTATED RINGERS IV SOLN: INTRAVENOUS | Status: DC | PRN

## 2016-12-18 MED ORDER — ACETAMINOPHEN 650 MG RE SUPP: 650.0000 mg | Freq: Four times a day (QID) | RECTAL | Status: DC | PRN

## 2016-12-18 MED ORDER — PANTOPRAZOLE SODIUM 40 MG IV SOLR
40.0000 mg | Freq: Every day | INTRAVENOUS | Status: DC
  Administered 2016-12-18: 40 mg via INTRAVENOUS
  Filled 2016-12-18: qty 40

## 2016-12-18 MED ORDER — OXYCODONE HCL 5 MG PO TABS
5.0000 mg | ORAL_TABLET | ORAL | Status: DC | PRN
  Administered 2016-12-18 – 2016-12-20 (×6): 10 mg via ORAL
  Filled 2016-12-18 (×5): qty 2

## 2016-12-18 MED ORDER — CHLORHEXIDINE GLUCONATE CLOTH 2 % EX PADS: 6.0000 | MEDICATED_PAD | Freq: Once | CUTANEOUS | Status: DC

## 2016-12-18 MED ORDER — FENTANYL CITRATE (PF) 250 MCG/5ML IJ SOLN
INTRAMUSCULAR | Status: AC
  Filled 2016-12-18: qty 5

## 2016-12-18 MED ORDER — TRAMADOL HCL 50 MG PO TABS: 50.0000 mg | ORAL_TABLET | Freq: Two times a day (BID) | ORAL | Status: DC | PRN

## 2016-12-18 MED ORDER — ROCURONIUM BROMIDE 100 MG/10ML IV SOLN
INTRAVENOUS | Status: DC | PRN
  Administered 2016-12-18: 50 mg via INTRAVENOUS

## 2016-12-18 MED ORDER — MIDAZOLAM HCL 5 MG/5ML IJ SOLN
INTRAMUSCULAR | Status: DC | PRN
  Administered 2016-12-18: 2 mg via INTRAVENOUS

## 2016-12-18 MED ORDER — OXYCODONE HCL 5 MG/5ML PO SOLN: 5.0000 mg | Freq: Once | ORAL | Status: DC | PRN

## 2016-12-18 MED ORDER — HYDROMORPHONE HCL 1 MG/ML IJ SOLN
1.0000 mg | INTRAMUSCULAR | Status: DC | PRN
  Administered 2016-12-18 – 2016-12-21 (×11): 1 mg via INTRAVENOUS
  Filled 2016-12-18 (×11): qty 1

## 2016-12-18 MED ORDER — PROPOFOL 10 MG/ML IV BOLUS
INTRAVENOUS | Status: DC | PRN
  Administered 2016-12-18: 160 mg via INTRAVENOUS

## 2016-12-18 MED ORDER — OXYCODONE HCL 5 MG PO TABS
ORAL_TABLET | ORAL | Status: AC
  Administered 2016-12-18: 10 mg via ORAL
  Filled 2016-12-18: qty 2

## 2016-12-18 MED ORDER — METHOCARBAMOL 500 MG PO TABS: 500.0000 mg | ORAL_TABLET | Freq: Two times a day (BID) | ORAL | Status: DC | PRN

## 2016-12-18 MED ORDER — MEPERIDINE HCL 25 MG/ML IJ SOLN: 6.2500 mg | INTRAMUSCULAR | Status: DC | PRN

## 2016-12-18 MED ORDER — HYDROMORPHONE HCL 1 MG/ML IJ SOLN
INTRAMUSCULAR | Status: AC
  Administered 2016-12-18: 0.5 mg via INTRAVENOUS
  Filled 2016-12-18: qty 0.5

## 2016-12-18 MED ORDER — HYDRALAZINE HCL 20 MG/ML IJ SOLN: 10.0000 mg | INTRAMUSCULAR | Status: DC | PRN

## 2016-12-18 MED ORDER — HYDROMORPHONE HCL 1 MG/ML IJ SOLN
0.2500 mg | INTRAMUSCULAR | Status: DC | PRN
  Administered 2016-12-18 (×2): 0.5 mg via INTRAVENOUS

## 2016-12-18 MED ORDER — LABETALOL HCL 100 MG PO TABS
200.0000 mg | ORAL_TABLET | Freq: Three times a day (TID) | ORAL | Status: DC
  Administered 2016-12-19 – 2016-12-21 (×7): 200 mg via ORAL
  Filled 2016-12-18 (×7): qty 2

## 2016-12-18 MED ORDER — SUGAMMADEX SODIUM 200 MG/2ML IV SOLN
INTRAVENOUS | Status: DC | PRN
  Administered 2016-12-18: 200 mg via INTRAVENOUS

## 2016-12-18 MED ORDER — ZOLPIDEM TARTRATE 5 MG PO TABS: 5.0000 mg | ORAL_TABLET | Freq: Every evening | ORAL | Status: DC | PRN

## 2016-12-18 SURGICAL SUPPLY — 41 items
BLADE CLIPPER SURG (BLADE) IMPLANT
CANISTER SUCT 3000ML PPV (MISCELLANEOUS) ×2 IMPLANT
CHLORAPREP W/TINT 26ML (MISCELLANEOUS) ×2 IMPLANT
COVER SURGICAL LIGHT HANDLE (MISCELLANEOUS) ×2 IMPLANT
DERMABOND ADVANCED (GAUZE/BANDAGES/DRESSINGS) ×1
DERMABOND ADVANCED .7 DNX12 (GAUZE/BANDAGES/DRESSINGS) ×1 IMPLANT
DRAPE LAPAROSCOPIC ABDOMINAL (DRAPES) ×2 IMPLANT
DRAPE UTILITY XL STRL (DRAPES) IMPLANT
ELECT CAUTERY BLADE 6.4 (BLADE) ×2 IMPLANT
ELECT REM PT RETURN 9FT ADLT (ELECTROSURGICAL) ×2
ELECTRODE REM PT RTRN 9FT ADLT (ELECTROSURGICAL) ×1 IMPLANT
GAUZE SPONGE 4X4 12PLY STRL (GAUZE/BANDAGES/DRESSINGS) IMPLANT
GLOVE BIO SURGEON STRL SZ7.5 (GLOVE) ×2 IMPLANT
GLOVE BIO SURGEON STRL SZ8 (GLOVE) ×2 IMPLANT
GLOVE BIOGEL PI IND STRL 8 (GLOVE) ×4 IMPLANT
GLOVE BIOGEL PI INDICATOR 8 (GLOVE) ×4
GLOVE ECLIPSE 8.0 STRL XLNG CF (GLOVE) ×2 IMPLANT
GLOVE INDICATOR 7.5 STRL GRN (GLOVE) ×2 IMPLANT
GOWN STRL REUS W/ TWL LRG LVL3 (GOWN DISPOSABLE) ×2 IMPLANT
GOWN STRL REUS W/ TWL XL LVL3 (GOWN DISPOSABLE) ×2 IMPLANT
GOWN STRL REUS W/TWL LRG LVL3 (GOWN DISPOSABLE) ×2
GOWN STRL REUS W/TWL XL LVL3 (GOWN DISPOSABLE) ×2
KIT BASIN OR (CUSTOM PROCEDURE TRAY) ×2 IMPLANT
KIT ROOM TURNOVER OR (KITS) ×2 IMPLANT
MESH VENTRALEX ST 2.5 CRC MED (Mesh General) ×2 IMPLANT
NEEDLE 22X1 1/2 (OR ONLY) (NEEDLE) ×2 IMPLANT
NS IRRIG 1000ML POUR BTL (IV SOLUTION) ×2 IMPLANT
PACK GENERAL/GYN (CUSTOM PROCEDURE TRAY) ×2 IMPLANT
PAD ARMBOARD 7.5X6 YLW CONV (MISCELLANEOUS) ×2 IMPLANT
STAPLER VISISTAT 35W (STAPLE) IMPLANT
SUT MNCRL AB 4-0 PS2 18 (SUTURE) ×2 IMPLANT
SUT NOVA 1 T20/GS 25DT (SUTURE) ×4 IMPLANT
SUT PROLENE 0 CT 2 (SUTURE) IMPLANT
SUT VIC AB 3-0 SH 27 (SUTURE) ×1
SUT VIC AB 3-0 SH 27X BRD (SUTURE) ×1 IMPLANT
SYR CONTROL 10ML LL (SYRINGE) ×2 IMPLANT
TOWEL OR 17X24 6PK STRL BLUE (TOWEL DISPOSABLE) ×2 IMPLANT
TOWEL OR 17X26 10 PK STRL BLUE (TOWEL DISPOSABLE) IMPLANT
TRAY FOLEY CATH 14FRSI W/METER (CATHETERS) IMPLANT
TRAY FOLEY CATH SILVER 14FR (SET/KITS/TRAYS/PACK) ×2 IMPLANT
WATER STERILE IRR 1000ML POUR (IV SOLUTION) IMPLANT

## 2016-12-18 NOTE — H&P (Signed)
Jennifer [name] 12/13/2016 10:28 AM Location: [facility] Patient #: [id number] DOB: Aug 13, 1976 Single / Language: Jennifer [name] Molt / Race: Black or African American Female   History of Present Illness Jennifer Neri E. Grandville Silos MD; 12/13/2016 10:42 AM) The patient is a 41 year old female who presents with an incisional hernia. Dr. Arvella Nigh asked me to see Jennifer [name] in consultation regarding an incisional hernia. She underwent myomectomy in December of last year. She developed a painful knot along her lower midline several weeks ago. She has undergone further evaluation with CT scan of the abdomen and pelvis. This demonstrated an incisional hernia involving some small bowel in that location. She complains of intermittent nausea. She has discomfort in that area. Additionally, she has a history of hypertension and she takes labetalol. She was hypertensive in our office today 180/90, however, she has documentation from Dr. Velora Mediate office from last week where her blood pressure was 130/90. There is likely some component of pain affecting her blood pressure at this time.   Allergies Jennifer [name], [place]; 12/13/2016 10:29 AM) No Known Drug Allergies 09/02/2014  Medication History Jennifer [name], [place]; 12/13/2016 10:31 AM) Labetalol HCl (200MG Tablet, Oral) Active. Hydroxychloroquine Sulfate (200MG Tablet, Oral) Active. Cimzia (2 X 200MG Kit, Subcutaneous) Active. Doxepin HCl (10MG Capsule, Oral) Active. Medications Reconciled    Review of Systems Jennifer [name] RMA; 12/13/2016 10:29 AM) General Present- Fatigue and Weight Gain. Not Present- Appetite Loss, Chills, Fever, Night Sweats and Weight Loss. Skin Not Present- Change in Wart/Mole, Dryness, Hives, Jaundice, New Lesions, Non-Healing Wounds, Rash and Ulcer. HEENT Not Present- Earache, Hearing Loss, Hoarseness, Nose Bleed, Oral Ulcers, Ringing in the Ears, Seasonal Allergies, Sinus Pain, Sore Throat, Visual Disturbances, Wears  glasses/contact lenses and Yellow Eyes. Respiratory Present- Snoring. Not Present- Bloody sputum, Chronic Cough, Difficulty Breathing and Wheezing. Breast Present- Breast Pain. Not Present- Breast Mass, Nipple Discharge and Skin Changes. Cardiovascular Present- Swelling of Extremities. Not Present- Chest Pain, Difficulty Breathing Lying Down, Leg Cramps, Palpitations, Rapid Heart Rate and Shortness of Breath. Gastrointestinal Present- Constipation. Not Present- Abdominal Pain, Bloating, Bloody Stool, Change in Bowel Habits, Chronic diarrhea, Difficulty Swallowing, Excessive gas, Gets full quickly at meals, Hemorrhoids, Indigestion, Nausea, Rectal Pain and Vomiting. Female Genitourinary Not Present- Frequency, Nocturia, Painful Urination, Pelvic Pain and Urgency. Musculoskeletal Present- Swelling of Extremities. Not Present- Back Pain, Joint Pain, Joint Stiffness, Muscle Pain and Muscle Weakness. Psychiatric Present- Anxiety. Not Present- Bipolar, Change in Sleep Pattern, Depression, Fearful and Frequent crying. Endocrine Present- Hot flashes. Not Present- Cold Intolerance, Excessive Hunger, Hair Changes, Heat Intolerance and New Diabetes.  Vitals Jennifer [name] RMA; 12/13/2016 10:31 AM) 12/13/2016 10:31 AM Weight: 261.2 lb Height: 64in Body Surface Area: 2.19 m Body Mass Index: 44.83 kg/m  Temp.: 97.18F  Pulse: 65 (Regular)  BP: 180/110 (Sitting, Left Arm, Standard)       Physical Exam Jennifer Neri E. Grandville Silos MD; 12/13/2016 10:43 AM) General Mental Status-Alert. General Appearance-Consistent with stated age. Hydration-Well hydrated. Voice-Normal.  Head and Neck Head-normocephalic, atraumatic with no lesions or palpable masses. Trachea-midline. Thyroid Gland Characteristics - normal size and consistency.  Eye Eyeball - Bilateral-Extraocular movements intact. Sclera/Conjunctiva - Bilateral-No scleral icterus.  Chest and Lung Exam Chest and lung exam  reveals -quiet, even and easy respiratory effort with no use of accessory muscles and on auscultation, normal breath sounds, no adventitious sounds and normal vocal resonance. Inspection Chest Wall - Normal. Back - normal.  Cardiovascular Cardiovascular examination reveals -normal heart sounds, regular rate and rhythm with no murmurs and  normal pedal pulses bilaterally.  Abdomen Inspection Hernias - Incisional - Reducible. Note: Reducible incisional hernia along her lower midline just above her Pfannenstiel incision, the hernia reduces and did not recur immediately, mild discomfort in the area, the fascial defect only feels to be a couple centimeters. Incisional scars - Bikini line scar. Palpation/Percussion Palpation and Percussion of the abdomen reveal - Soft, Non Tender, No Rebound tenderness, No Rigidity (guarding) and No hepatosplenomegaly. Auscultation Auscultation of the abdomen reveals - Bowel sounds normal.  Neurologic Neurologic evaluation reveals -alert and oriented x 3 with no impairment of recent or remote memory. Mental Status-Normal.  Musculoskeletal Global Assessment -Note: no gross deformities.  Normal Exam - Left-Upper Extremity Strength Normal and Lower Extremity Strength Normal. Normal Exam - Right-Upper Extremity Strength Normal and Lower Extremity Strength Normal.  Lymphatic Head & Neck  General Head & Neck Lymphatics: Bilateral - Description - Normal. Axillary  General Axillary Region: Bilateral - Description - Normal. Tenderness - Non Tender. Femoral & Inguinal  Generalized Femoral & Inguinal Lymphatics: Bilateral - Description - No Generalized lymphadenopathy.    Assessment & Plan Jennifer Neri E. Grandville Silos MD; 12/13/2016 10:44 AM) Fatima Blank HERNIA, WITHOUT OBSTRUCTION OR GANGRENE (K43.2) Impression: I have offered repair of incisional hernia with mesh. Procedure, risks, and benefits were discussed in detail with her. I also discussed the  expected postoperative course. We will plan overnight stay. She is agreeable.  Georganna Skeans, MD, MPH, FACS Trauma: [phone] General Surgery: [phone]

## 2016-12-18 NOTE — Transfer of Care (Signed)
Immediate Anesthesia Transfer of Care Note  Patient: Jennifer [name]  Procedure(s) Performed: Procedure(s): REPAIR INCISIONAL HERNIA WITH MESH (N/A) INSERTION OF MESH (N/A)  Patient Location: PACU  Anesthesia Type:General  Level of Consciousness: awake, alert , oriented and patient cooperative  Airway & Oxygen Therapy: Patient Spontanous Breathing and Patient connected to nasal cannula oxygen  Post-op Assessment: Report given to RN and Post -op Vital signs reviewed and stable  Post vital signs: Reviewed and stable  Last Vitals:  Vitals:   12/18/16 1305 12/18/16 1543  BP: (!) 177/98 (!) 145/91  Pulse:  76  Resp:  (!) 21  Temp:  36.6 C    Last Pain:  Vitals:   12/18/16 1243  TempSrc:   PainSc: 5          Complications: No apparent anesthesia complications

## 2016-12-18 NOTE — Op Note (Signed)
12/18/2016  3:32 PM  PATIENT:  Jennifer [name]  41 y.o. female  PRE-OPERATIVE DIAGNOSIS:  Incisional hernia  POST-OPERATIVE DIAGNOSIS:  Incisional hernia  PROCEDURE:  Procedure(s): REPAIR INCISIONAL HERNIA WITH MESH INSERTION OF MESH 6.3CM VENTRALEX  SURGEON:  Surgeon(s): Georganna Skeans, MD  ASSISTANTS: none   ANESTHESIA:   local and general  EBL:  Total I/O In: -  Out: 250 [Urine:200; Blood:50]  BLOOD ADMINISTERED:none  DRAINS: none   SPECIMEN:  No Specimen  DISPOSITION OF SPECIMEN:  N/A  COUNTS:  YES  DICTATION: .Dragon Dictation Findings: Incisional hernia along the fascial midline, small bowel easily reduced, defect only about 3cm.  Procedure in detail: Vesper presents for repair of incisional hernia with mesh. She was identified in the preop holding area. Informed consent was obtained. She received intravenous antibiotics. She was brought to the operating room and general endotracheal anesthesia was administered by the anesthesia staff. Foley catheter was placed by nursing. Her abdomen was prepped and draped in sterile fashion. We did a time out procedure. Lower midline incision was made. Subcutaneous tissues were dissected down into the subcutaneous tissues revealing the hernia sac. This was carefully entered sharply. The hernia sac was opened. Small bowel was present in the hernia. This was easily reduced back into the abdomen. There were no significant adhesions. The fascial defect was only about 3 cm. The hernia sac was then excised using cautery. I then chose a 6.3 cm Ventralex mesh. This inserted easily into the abdomen and laid flat against the anterior abdominal wall. It was secured to the anterior fascia circumferentially with multiple interrupted #1 Novafil sutures. The 2 tails were trimmed off the mesh. Final sutures succeeded in closing the fascia on top of the mesh. Subcutaneous tissues were irrigated. Hemostasis was ensured. Subcutaneous tissues were closed with  interrupted 3-0 Vicryl. Skin was closed with running 4-0 Monocryl subcuticular followed by Dermabond. All counts were correct. She tolerated procedure well without apparent complications and was taken recovery in stable condition. PATIENT DISPOSITION:  PACU - hemodynamically stable.   Delay start of Pharmacological VTE agent (>24hrs) due to surgical blood loss or risk of bleeding:  no  Georganna Skeans, MD, MPH, FACS Pager: [phone]  4/2/20183:32 PM

## 2016-12-18 NOTE — Interval H&P Note (Signed)
History and Physical Interval Note:  12/18/2016 1:45 PM  Jennifer [name]  has presented today for surgery, with the diagnosis of Incisional hernia  The various methods of treatment have been discussed with the patient and family. After consideration of risks, benefits and other options for treatment, the patient has consented to  Procedure(s): [place] (N/A) INSERTION OF MESH (N/A) as a surgical intervention .  The patient's history has been reviewed, patient examined, no change in status, stable for surgery.  I have reviewed the patient's chart and labs.  Questions were answered to the patient's satisfaction.     [name] E

## 2016-12-18 NOTE — Progress Notes (Signed)
Arrived to 6n18 from PACU, denies nausea/pain at this time, family at bedside, oriented to room and surroundings. Ambulated from stretcher to bed- gait steady.

## 2016-12-18 NOTE — Anesthesia Preprocedure Evaluation (Addendum)
Anesthesia Evaluation  Patient identified by MRN, date of birth, ID band Patient awake    Reviewed: Allergy & Precautions, H&P , NPO status , Patient's Chart, lab work & pertinent test results, reviewed documented beta blocker date and time   History of Anesthesia Complications (+) PROLONGED EMERGENCE  Airway Mallampati: III  TM Distance: >3 FB Neck ROM: full    Dental no notable dental hx. (+) Teeth Intact, Dental Advidsory Given   Pulmonary neg pulmonary ROS,    Pulmonary exam normal        Cardiovascular Exercise Tolerance: Good hypertension, Pt. on home beta blockers Normal cardiovascular exam     Neuro/Psych  Headaches, negative neurological ROS  negative psych ROS   GI/Hepatic negative GI ROS, Neg liver ROS, GERD  ,  Endo/Other  Morbid obesity  Renal/GU negative Renal ROS  negative genitourinary   Musculoskeletal  (+) Arthritis ,   Abdominal (+) + obese,   Peds  Hematology  (+) anemia ,   Anesthesia Other Findings   Reproductive/Obstetrics negative OB ROS                            Anesthesia Physical  Anesthesia Plan  ASA: III  Anesthesia Plan: General   Post-op Pain Management:    Induction: Intravenous  Airway Management Planned: Oral ETT  Additional Equipment:   Intra-op Plan:   Post-operative Plan: Extubation in OR  Informed Consent: I have reviewed the patients History and Physical, chart, labs and discussed the procedure including the risks, benefits and alternatives for the proposed anesthesia with the patient or authorized representative who has indicated his/her understanding and acceptance.   Dental Advisory Given  Plan Discussed with: CRNA and Surgeon  Anesthesia Plan Comments:         Anesthesia Quick Evaluation

## 2016-12-18 NOTE — Anesthesia Procedure Notes (Signed)
Procedure Name: Intubation Date/Time: 12/18/2016 2:22 PM Performed by: Shirlyn Goltz Pre-anesthesia Checklist: Patient identified, Emergency Drugs available, Suction available and Patient being monitored Patient Re-evaluated:Patient Re-evaluated prior to inductionOxygen Delivery Method: Circle system utilized Preoxygenation: Pre-oxygenation with 100% oxygen Intubation Type: IV induction Ventilation: Mask ventilation without difficulty Laryngoscope Size: Mac and 3 Grade View: Grade II Tube type: Oral Tube size: 7.0 mm Number of attempts: 1 Airway Equipment and Method: Stylet Placement Confirmation: ETT inserted through vocal cords under direct vision,  positive ETCO2 and breath sounds checked- equal and bilateral Secured at: 22 cm Tube secured with: Tape Dental Injury: Teeth and Oropharynx as per pre-operative assessment

## 2016-12-19 ENCOUNTER — Other Ambulatory Visit: Payer: Self-pay | Admitting: Rheumatology

## 2016-12-19 ENCOUNTER — Encounter (HOSPITAL_COMMUNITY): Payer: Self-pay | Admitting: General Surgery

## 2016-12-19 DIAGNOSIS — K432 Incisional hernia without obstruction or gangrene: Secondary | ICD-10-CM | POA: Diagnosis not present

## 2016-12-19 LAB — BASIC METABOLIC PANEL
Anion gap: 9 (ref 5–15)
BUN: <5 mg/dL — ABNORMAL LOW (ref 6–20)
CHLORIDE: 102 mmol/L (ref 101–111)
CO2: 25 mmol/L (ref 22–32)
Calcium: 8.4 mg/dL — ABNORMAL LOW (ref 8.9–10.3)
Creatinine, Ser: 0.84 mg/dL (ref 0.44–1.00)
GFR calc Af Amer: >60 mL/min (ref >60)
GFR calc non Af Amer: >60 mL/min (ref >60)
GLUCOSE: 94 mg/dL (ref 65–99)
POTASSIUM: 3.6 mmol/L (ref 3.5–5.1)
Sodium: 136 mmol/L (ref 135–145)

## 2016-12-19 LAB — CBC
HCT: 34.3 % — ABNORMAL LOW (ref 36.0–46.0)
Hemoglobin: 11.4 g/dL — ABNORMAL LOW (ref 12.0–15.0)
MCH: 29.5 pg (ref 26.0–34.0)
MCHC: 33.2 g/dL (ref 30.0–36.0)
MCV: 88.6 fL (ref 78.0–100.0)
PLATELETS: 227 10*3/uL (ref 150–400)
RBC: 3.87 MIL/uL (ref 3.87–5.11)
RDW: 14.3 % (ref 11.5–15.5)
WBC: 5.6 10*3/uL (ref 4.0–10.5)

## 2016-12-19 MED ORDER — TRAMADOL HCL 50 MG PO TABS
50.0000 mg | ORAL_TABLET | Freq: Four times a day (QID) | ORAL | Status: DC
  Administered 2016-12-19 – 2016-12-20 (×4): 50 mg via ORAL
  Filled 2016-12-19 (×4): qty 1

## 2016-12-19 MED ORDER — PANTOPRAZOLE SODIUM 40 MG PO TBEC
40.0000 mg | DELAYED_RELEASE_TABLET | Freq: Every day | ORAL | Status: DC
  Administered 2016-12-19 – 2016-12-20 (×2): 40 mg via ORAL
  Filled 2016-12-19 (×2): qty 1

## 2016-12-19 MED ORDER — ALUM & MAG HYDROXIDE-SIMETH 200-200-20 MG/5ML PO SUSP: 30.0000 mL | ORAL | Status: DC | PRN

## 2016-12-19 MED ORDER — DIPHENHYDRAMINE HCL 50 MG/ML IJ SOLN: 25.0000 mg | Freq: Four times a day (QID) | INTRAMUSCULAR | Status: DC | PRN

## 2016-12-19 NOTE — Progress Notes (Signed)
1 Day Post-Op  Subjective: CC a lot of pain overnight, tolerated clears  Objective: Vital signs in last 24 hours: Temp:  [97.6 F (36.4 C)-98.3 F (36.8 C)] 98.3 F (36.8 C) (04/03 0529) Pulse Rate:  [59-76] 65 (04/03 0529) Resp:  [12-29] 29 (04/03 0529) BP: (106-179)/(76-112) 135/76 (04/03 0529) SpO2:  [97 %-100 %] 98 % (04/03 0529) Weight:  [118.8 kg (262 lb)] 118.8 kg (262 lb) (04/02 1225) Last BM Date: 12/17/16  Intake/Output from previous day: 04/02 0701 - 04/03 0700 In: 2200 [P.O.:600; I.V.:1600] Out: 252 [Urine:202; Blood:50] Intake/Output this shift: No intake/output data recorded.  General appearance: alert and cooperative Resp: clear to auscultation bilaterally GI: incision intact with min drainage  Lab Results:   Recent Labs  12/18/16 1843 12/19/16 0449  WBC 4.9 5.6  HGB 11.7* 11.4*  HCT 34.9* 34.3*  PLT 242 227   BMET  Recent Labs  12/18/16 1843 12/19/16 0449  NA  --  136  K  --  3.6  CL  --  102  CO2  --  25  GLUCOSE  --  94  BUN  --  <5*  CREATININE 0.87 0.84  CALCIUM  --  8.4*   PT/INR No results for input(s): LABPROT, INR in the last 72 hours. ABG No results for input(s): PHART, HCO3 in the last 72 hours.  Invalid input(s): PCO2, PO2  Studies/Results: No results found.  Anti-infectives: Anti-infectives    Start     Dose/Rate Route Frequency Ordered Stop   12/18/16 1400  ceFAZolin (ANCEF) 3 g in dextrose 5 % 50 mL IVPB     3 g 130 mL/hr over 30 Minutes Intravenous To ShortStay Surgical 12/15/16 1155 12/18/16 1415      Assessment/Plan: s/p Procedure(s): REPAIR INCISIONAL HERNIA WITH MESH (N/A) INSERTION OF MESH (N/A) POD#1 Advance diet SL IV Add Ultram scheduled Check this PM for possible D/C RN getting larger binder for her  LOS: 0 days    Jennifer [name] E 12/19/2016

## 2016-12-19 NOTE — Progress Notes (Signed)
Patient ID: Jennifer [name], female   DOB: [birth year], 41 y.o.   MRN: [id number] Still having pain. Some itching. Add benadryl. Will keep overnight. Plan D/C in AM. Georganna Skeans, MD, MPH, FACS Trauma: [phone] General Surgery: [phone]

## 2016-12-20 ENCOUNTER — Encounter (HOSPITAL_COMMUNITY): Payer: Self-pay | Admitting: General Practice

## 2016-12-20 ENCOUNTER — Ambulatory Visit: Payer: Commercial Managed Care - PPO

## 2016-12-20 DIAGNOSIS — K432 Incisional hernia without obstruction or gangrene: Secondary | ICD-10-CM | POA: Diagnosis not present

## 2016-12-20 LAB — CBC
HCT: 34.3 % — ABNORMAL LOW (ref 36.0–46.0)
Hemoglobin: 11.1 g/dL — ABNORMAL LOW (ref 12.0–15.0)
MCH: 28.8 pg (ref 26.0–34.0)
MCHC: 32.4 g/dL (ref 30.0–36.0)
MCV: 88.9 fL (ref 78.0–100.0)
PLATELETS: 239 10*3/uL (ref 150–400)
RBC: 3.86 MIL/uL — AB (ref 3.87–5.11)
RDW: 14.1 % (ref 11.5–15.5)
WBC: 8 10*3/uL (ref 4.0–10.5)

## 2016-12-20 LAB — BASIC METABOLIC PANEL
Anion gap: 6 (ref 5–15)
BUN: 9 mg/dL (ref 6–20)
CALCIUM: 8 mg/dL — AB (ref 8.9–10.3)
CO2: 26 mmol/L (ref 22–32)
CREATININE: 0.95 mg/dL (ref 0.44–1.00)
Chloride: 100 mmol/L — ABNORMAL LOW (ref 101–111)
GFR calc Af Amer: >60 mL/min (ref >60)
GFR calc non Af Amer: >60 mL/min (ref >60)
GLUCOSE: 113 mg/dL — AB (ref 65–99)
Potassium: 3.5 mmol/L (ref 3.5–5.1)
SODIUM: 132 mmol/L — AB (ref 135–145)

## 2016-12-20 MED ORDER — TRAMADOL HCL 50 MG PO TABS
100.0000 mg | ORAL_TABLET | Freq: Four times a day (QID) | ORAL | Status: DC
  Administered 2016-12-20 – 2016-12-21 (×5): 100 mg via ORAL
  Filled 2016-12-20 (×5): qty 2

## 2016-12-20 MED ORDER — TRAMADOL HCL 50 MG PO TABS: 100.0000 mg | ORAL_TABLET | Freq: Two times a day (BID) | ORAL | Status: DC | PRN

## 2016-12-20 MED ORDER — HYDROCODONE-ACETAMINOPHEN 10-325 MG PO TABS
1.0000 | ORAL_TABLET | ORAL | Status: DC | PRN
  Administered 2016-12-20 – 2016-12-21 (×7): 1 via ORAL
  Filled 2016-12-20 (×7): qty 1

## 2016-12-20 MED ORDER — IBUPROFEN 400 MG PO TABS
400.0000 mg | ORAL_TABLET | Freq: Three times a day (TID) | ORAL | Status: DC | PRN
  Administered 2016-12-20 – 2016-12-21 (×2): 400 mg via ORAL
  Filled 2016-12-20 (×2): qty 1

## 2016-12-20 NOTE — Progress Notes (Signed)
Patient has had a moderate amount of drainage from abdomen during night. Gauze has been changed multiple times. Will continue to monitor.  Jimmie Molly, RN

## 2016-12-20 NOTE — Progress Notes (Signed)
Patient voided a small amount and she is refusing to have an in and out. She said she wants to think about it and we will continue to watch.

## 2016-12-20 NOTE — Anesthesia Postprocedure Evaluation (Addendum)
Anesthesia Post Note  Patient: Jennifer SOLLERS  Procedure(s) Performed: Procedure(s) (LRB): REPAIR INCISIONAL HERNIA WITH MESH (N/A) INSERTION OF MESH (N/A)  Patient location during evaluation: PACU Anesthesia Type: General Level of consciousness: awake and sedated Pain management: pain level controlled Vital Signs Assessment: post-procedure vital signs reviewed and stable Respiratory status: spontaneous breathing, nonlabored ventilation, respiratory function stable and patient connected to nasal cannula oxygen Cardiovascular status: blood pressure returned to baseline and stable Postop Assessment: no signs of nausea or vomiting Anesthetic complications: no       Last Vitals:  Vitals:   12/19/16 2202 12/20/16 0405  BP: 130/88 121/70  Pulse: 76 75  Resp: 10 19  Temp: 37.2 C 36.8 C    Last Pain:  Vitals:   12/20/16 0800  TempSrc:   PainSc: 4                  Larri Yehle,JAMES TERRILL

## 2016-12-20 NOTE — Progress Notes (Signed)
Pt c/o not being able to urinate since 0400 this am. MD/ Grandville Silos updated no new orders received at that time. Pt is still unable to urinate, bladder scan done <400cc in bladder pt is not in distress

## 2016-12-20 NOTE — Progress Notes (Signed)
Patient has had multiple complaints of pain throughout night. States "its just like before surgery, its not improving". Pain was managed with both oxy IR and dilaudid. Patients states that "the oxy is not helping with the pain". Will continue to monitor.  Jimmie Molly, RN

## 2016-12-20 NOTE — Progress Notes (Signed)
2 Days Post-Op  Subjective: Pain not controlled well, eating OK  Objective: Vital signs in last 24 hours: Temp:  [98.3 F (36.8 C)-99.2 F (37.3 C)] 98.3 F (36.8 C) (04/04 0405) Pulse Rate:  [75-79] 75 (04/04 0405) Resp:  [10-19] 19 (04/04 0405) BP: (121-143)/(70-93) 121/70 (04/04 0405) SpO2:  [99 %-100 %] 99 % (04/04 0405) Last BM Date: 12/17/16  Intake/Output from previous day: 04/03 0701 - 04/04 0700 In: 255 [P.O.:255] Out: -  Intake/Output this shift: No intake/output data recorded.  General appearance: alert and cooperative GI: mild serous drainage from wound, no erythema, no cellulitis  Lab Results:   Recent Labs  12/18/16 1843 12/19/16 0449  WBC 4.9 5.6  HGB 11.7* 11.4*  HCT 34.9* 34.3*  PLT 242 227   BMET  Recent Labs  12/18/16 1843 12/19/16 0449  NA  --  136  K  --  3.6  CL  --  102  CO2  --  25  GLUCOSE  --  94  BUN  --  <5*  CREATININE 0.87 0.84  CALCIUM  --  8.4*   PT/INR No results for input(s): LABPROT, INR in the last 72 hours. ABG No results for input(s): PHART, HCO3 in the last 72 hours.  Invalid input(s): PCO2, PO2  Studies/Results: No results found.  Anti-infectives: Anti-infectives    Start     Dose/Rate Route Frequency Ordered Stop   12/18/16 1400  ceFAZolin (ANCEF) 3 g in dextrose 5 % 50 mL IVPB     3 g 130 mL/hr over 30 Minutes Intravenous To ShortStay Surgical 12/15/16 1155 12/18/16 1415      Assessment/Plan: s/p Procedure(s): REPAIR INCISIONAL HERNIA WITH MESH (N/A) INSERTION OF MESH (N/A) POD#2 D/C oxy Start Norco 10 Increase Ultram Add ibuprofen Continue to mobilize D/C once pain controlled. Likely tomorrow VTE - Lovenox  LOS: 0 days    Shanea Karney E 12/20/2016

## 2016-12-20 NOTE — Progress Notes (Signed)
Bladder scan was 139ml, told patient to drink plenty of fluids and we can try later to scan her if she feels backed up but I didn't think a in and out cath was necessary and patient agreed.

## 2016-12-21 DIAGNOSIS — K432 Incisional hernia without obstruction or gangrene: Secondary | ICD-10-CM | POA: Diagnosis not present

## 2016-12-21 MED ORDER — TRAMADOL HCL 50 MG PO TABS: 100.0000 mg | ORAL_TABLET | Freq: Four times a day (QID) | ORAL | 0 refills | Status: DC

## 2016-12-21 MED ORDER — HYDROCODONE-ACETAMINOPHEN 10-325 MG PO TABS: 1.0000 | ORAL_TABLET | ORAL | 0 refills | Status: DC | PRN

## 2016-12-21 NOTE — Progress Notes (Addendum)
D/C papers gone over with pt. Wound dressing change taught earlier this morning. Pt. States her boyfriend will do them at home. Prescriptions given to pt. NO questions/complaints. IV taken out. Supplies for dressing change given to pt. Pt. D/c'd successfully via w/c.

## 2016-12-21 NOTE — Care Management Note (Signed)
Case Management Note  Patient Details  Name: Jennifer [name] MRN: [id number] Date of Birth: 1976/07/01  Subjective/Objective:    s/p incisional hernia repair with mesh, for dc today, no needs.                 Action/Plan:   Expected Discharge Date:  12/21/16               Expected Discharge Plan:  Home/Self Care  In-House Referral:     Discharge planning Services  CM Consult  Post Acute Care Choice:    Choice offered to:     DME Arranged:    DME Agency:     HH Arranged:    HH Agency:     Status of Service:  Completed, signed off  If discussed at [employer] of Stay Meetings, dates discussed:    Additional Comments:  Zenon Mayo, RN 12/21/2016, 12:28 PM

## 2016-12-21 NOTE — Discharge Summary (Signed)
Physician Discharge Summary  Patient ID: Jennifer [name] MRN: [id number] DOB/AGE: [birth year] 41 y.o.  Admit date: 12/18/2016 Discharge date: 12/21/2016  Admission Diagnoses:incisional hernia  Discharge Diagnoses: S/P repair incisional hernia with mesh Active Problems:   Incisional hernia   Discharged Condition: good  Hospital Course: Post-op she had difficulty with pain control. Her medications were adjusted with good results. She had urinary retention which resolved. Ready for D/C.  Consults: None  Significant Diagnostic Studies: none  Treatments: surgery: above  Discharge Exam: Blood pressure 109/65, pulse 64, temperature 97.7 F (36.5 C), temperature source Oral, resp. rate 19, weight 118.8 kg (262 lb), last menstrual period 11/25/2016, SpO2 94 %. General appearance: cooperative Resp: clear to auscultation bilaterally Cardio: regular rate and rhythm GI: soft, incision intact, minimal ss drainage, no cellulitis  Disposition: 01-Home or Self Care  Discharge Instructions    Diet - low sodium heart healthy    Complete by:  As directed    Discharge instructions    Complete by:  As directed    [facility] _______[facility]  UMBILICAL OR INGUINAL HERNIA REPAIR: POST OP INSTRUCTIONS  Always review your discharge instruction sheet given to you by the facility where your surgery was performed. IF YOU HAVE DISABILITY OR FAMILY LEAVE FORMS, YOU MUST BRING THEM TO THE OFFICE FOR PROCESSING.   DO NOT GIVE THEM TO YOUR DOCTOR.  1. A  prescription for pain medication may be given to you upon discharge.  Take your pain medication as prescribed, if needed.  If narcotic pain medicine is not needed, then you may take acetaminophen (Tylenol) or ibuprofen (Advil) as needed. 2. Take your usually prescribed medications unless otherwise directed. If you need a refill on your pain medication, please contact your pharmacy.  They will contact our office to request authorization.  Prescriptions will not be filled after 5 pm or on week-ends. 3. You should follow a light diet the first 24 hours after arrival home, such as soup and crackers, etc.  Be sure to include lots of fluids daily.  Resume your normal diet the day after surgery. 4.Most patients will experience some swelling and bruising around the umbilicus or in the groin and scrotum.  Ice packs and reclining will help.  Swelling and bruising can take several days to resolve.  6. It is common to experience some constipation if taking pain medication after surgery.  Increasing fluid intake and taking a stool softener (such as Colace) will usually help or prevent this problem from occurring.  A mild laxative (Milk of Magnesia or Miralax) should be taken according to package directions if there are no bowel movements after 48 hours. 7. Unless discharge instructions indicate otherwise, you may remove your bandages 24-48 hours after surgery, and you may shower at that time.  You may have steri-strips (small skin tapes) in place directly over the incision.  These strips should be left on the skin for 7-10 days.  If your surgeon used skin glue on the incision, you may shower in 24 hours.  The glue will flake off over the next 2-3 weeks.  Any sutures or staples will be removed at the office during your follow-up visit. 8. ACTIVITIES:  You may resume regular (light) daily activities beginning the next day-such as daily self-care, walking, climbing stairs-gradually increasing activities as tolerated.  You may have sexual intercourse when it is comfortable.  Refrain from any heavy lifting or straining until approved by your doctor.  a.You may drive when you are  no longer taking prescription pain medication, you can comfortably wear a seatbelt, and you can safely maneuver your car and apply brakes. b.RETURN TO WORK:   _____________________________________________  9.You should see your doctor in the office for a follow-up appointment  approximately 2-3 weeks after your surgery.  Make sure that you call for this appointment within a day or two after you arrive home to insure a convenient appointment time. 10.OTHER INSTRUCTIONS: _________________________    _____________________________________  WHEN TO CALL YOUR DOCTOR: Fever over 101.0 Inability to urinate Nausea and/or vomiting Extreme swelling or bruising Continued bleeding from incision. Increased pain, redness, or drainage from the incision  The clinic staff is available to answer your questions during regular business hours.  Please don't hesitate to call and ask to speak to one of the nurses for clinical concerns.  If you have a medical emergency, go to the nearest emergency room or call 911.  A surgeon from [facility] is always on call at the hospital   [address] [place], [place], [place]  [address] ?  [address], [place], [place]   [address] [phone] ? [phone] ? FAX [phone] Web site: [email or web address]   Discharge wound care:    Complete by:  As directed    Keep wound covered until there is no drainage. Then you can leave it open. You may shower.   Increase activity slowly    Complete by:  As directed      Allergies as of 12/21/2016      Reactions   No Known Allergies       Medication List    STOP taking these medications   HYDROcodone-acetaminophen 5-325 MG tablet Commonly known as:  NORCO/VICODIN Replaced by:  HYDROcodone-acetaminophen 10-325 MG tablet     TAKE these medications   CIMZIA PREFILLED 2 X 200 MG/ML Kit Generic drug:  Certolizumab Pegol Inject 1 kit into the skin every 28 (twenty-eight) days.   doxepin 10 MG capsule Commonly known as:  SINEQUAN Take 10 mg by mouth 2 (two) times daily as needed (hives).   HYDROcodone-acetaminophen 10-325 MG tablet Commonly known as:  NORCO Take 1 tablet by mouth every 4 (four) hours as needed for moderate pain or severe pain. Replaces:   HYDROcodone-acetaminophen 5-325 MG tablet   iron polysaccharides 150 MG capsule Commonly known as:  FERREX 150 Take 1 capsule (150 mg total) by mouth daily.   labetalol 200 MG tablet Commonly known as:  NORMODYNE TAKE 1 TABLET TWICE A DAY INCREASE TO 300MG TWICE DAILY THEN 400MG TWICE DAILY AS DIRECTED What changed:  See the new instructions.   methocarbamol 500 MG tablet Commonly known as:  ROBAXIN Take 500 mg by mouth 2 (two) times daily between meals as needed for muscle spasms.   ondansetron 4 MG disintegrating tablet Commonly known as:  ZOFRAN ODT Take 1 tablet (4 mg total) by mouth every 8 (eight) hours as needed for nausea.   STOOL SOFTENER PO Take 1 capsule by mouth daily.   traMADol 50 MG tablet Commonly known as:  ULTRAM Take 2 tablets (100 mg total) by mouth every 6 (six) hours. What changed:  how much to take  when to take this  reasons to take this   ZANTAC 75 PO Take 75 mg by mouth daily.        SignedZenovia Jarred 12/21/2016, 7:25 AM

## 2016-12-28 ENCOUNTER — Other Ambulatory Visit: Payer: Self-pay

## 2016-12-28 ENCOUNTER — Telehealth: Payer: Self-pay | Admitting: Rheumatology

## 2016-12-28 DIAGNOSIS — K439 Ventral hernia without obstruction or gangrene: Secondary | ICD-10-CM

## 2016-12-28 NOTE — Telephone Encounter (Signed)
Patient had surgery on 12/18/16 for a incisional hernia repair with mesh. Patient would like to come in to get her Cimzia. When is it okay to schedule patient to come in for the injection?

## 2016-12-28 NOTE — Telephone Encounter (Signed)
Patient would like to know when she can reschedule her nurse visit to get the Cimzia injection. Patient had surgery last Monday.

## 2016-12-28 NOTE — Telephone Encounter (Signed)
If her surgeon gives her clearance then okay to start Cimzia

## 2016-12-29 NOTE — Telephone Encounter (Signed)
Patient advised she needs clearance from her surgeon to restart Cimzia. Patient states she will Dentist.

## 2017-01-02 ENCOUNTER — Telehealth: Payer: Self-pay | Admitting: *Deleted

## 2017-01-02 NOTE — Telephone Encounter (Signed)
Patient advised we received clearance letter to restart Cimzia 2 weeks from 01/01/17. patient states she will contact the office to schedule visit.

## 2017-01-18 DIAGNOSIS — G43709 Chronic migraine without aura, not intractable, without status migrainosus: Secondary | ICD-10-CM | POA: Insufficient documentation

## 2017-01-26 ENCOUNTER — Ambulatory Visit (INDEPENDENT_AMBULATORY_CARE_PROVIDER_SITE_OTHER): Payer: Commercial Managed Care - PPO

## 2017-01-26 ENCOUNTER — Ambulatory Visit (INDEPENDENT_AMBULATORY_CARE_PROVIDER_SITE_OTHER): Payer: Commercial Managed Care - PPO | Admitting: *Deleted

## 2017-01-26 DIAGNOSIS — M0579 Rheumatoid arthritis with rheumatoid factor of multiple sites without organ or systems involvement: Secondary | ICD-10-CM | POA: Diagnosis not present

## 2017-01-26 MED ORDER — CERTOLIZUMAB PEGOL 2 X 200 MG/ML ~~LOC~~ KIT
400.0000 mg | PACK | Freq: Once | SUBCUTANEOUS | Status: AC
  Administered 2017-01-26: 400 mg via SUBCUTANEOUS

## 2017-01-26 NOTE — Progress Notes (Signed)
Patient in office for a Cmzia injection. Patient tolerated injection well. Patient is due for a follow up visit and has scheduled her visit for 03/07/17.

## 2017-02-16 NOTE — Addendum Note (Signed)
Addendum  created 02/16/17 1317 by Kaydence Baba, MD   Sign clinical note    

## 2017-03-07 ENCOUNTER — Ambulatory Visit (INDEPENDENT_AMBULATORY_CARE_PROVIDER_SITE_OTHER): Payer: Commercial Managed Care - PPO | Admitting: Rheumatology

## 2017-03-07 ENCOUNTER — Encounter: Payer: Self-pay | Admitting: Rheumatology

## 2017-03-07 VITALS — BP 134/78 | HR 90 | Resp 14 | Ht 64.0 in | Wt 270.0 lb

## 2017-03-07 DIAGNOSIS — M25562 Pain in left knee: Secondary | ICD-10-CM | POA: Diagnosis not present

## 2017-03-07 DIAGNOSIS — M0579 Rheumatoid arthritis with rheumatoid factor of multiple sites without organ or systems involvement: Secondary | ICD-10-CM

## 2017-03-07 DIAGNOSIS — M170 Bilateral primary osteoarthritis of knee: Secondary | ICD-10-CM | POA: Diagnosis not present

## 2017-03-07 DIAGNOSIS — G8929 Other chronic pain: Secondary | ICD-10-CM | POA: Diagnosis not present

## 2017-03-07 DIAGNOSIS — M25561 Pain in right knee: Secondary | ICD-10-CM | POA: Diagnosis not present

## 2017-03-07 DIAGNOSIS — Z79899 Other long term (current) drug therapy: Secondary | ICD-10-CM | POA: Diagnosis not present

## 2017-03-07 LAB — COMPLETE METABOLIC PANEL WITH GFR
ALBUMIN: 3.7 g/dL (ref 3.6–5.1)
ALT: 9 U/L (ref 6–29)
AST: 12 U/L (ref 10–30)
Alkaline Phosphatase: 60 U/L (ref 33–115)
BUN: 11 mg/dL (ref 7–25)
CHLORIDE: 106 mmol/L (ref 98–110)
CO2: 24 mmol/L (ref 20–31)
Calcium: 8.7 mg/dL (ref 8.6–10.2)
Creat: 0.91 mg/dL (ref 0.50–1.10)
GFR, Est African American: >89 mL/min (ref >=60)
GFR, Est Non African American: 79 mL/min (ref >=60)
GLUCOSE: 88 mg/dL (ref 65–99)
POTASSIUM: 4.1 mmol/L (ref 3.5–5.3)
SODIUM: 139 mmol/L (ref 135–146)
Total Bilirubin: 0.4 mg/dL (ref 0.2–1.2)
Total Protein: 7.3 g/dL (ref 6.1–8.1)

## 2017-03-07 LAB — CBC WITH DIFFERENTIAL/PLATELET
Basophils Absolute: 0 {cells}/uL (ref 0–200)
Basophils Relative: 0 %
Eosinophils Absolute: 174 {cells}/uL (ref 15–500)
Eosinophils Relative: 3 %
HCT: 36.2 % (ref 35.0–45.0)
Hemoglobin: 11.5 g/dL — ABNORMAL LOW (ref 11.7–15.5)
Lymphocytes Relative: 48 %
Lymphs Abs: 2784 {cells}/uL (ref 850–3900)
MCH: 29.3 pg (ref 27.0–33.0)
MCHC: 31.8 g/dL — ABNORMAL LOW (ref 32.0–36.0)
MCV: 92.1 fL (ref 80.0–100.0)
MPV: 9.3 fL (ref 7.5–12.5)
Monocytes Absolute: 928 {cells}/uL (ref 200–950)
Monocytes Relative: 16 %
Neutro Abs: 1914 {cells}/uL (ref 1500–7800)
Neutrophils Relative %: 33 %
Platelets: 300 K/uL (ref 140–400)
RBC: 3.93 MIL/uL (ref 3.80–5.10)
RDW: 14.3 % (ref 11.0–15.0)
WBC: 5.8 K/uL (ref 3.8–10.8)

## 2017-03-07 MED ORDER — CERTOLIZUMAB PEGOL 2 X 200 MG/ML ~~LOC~~ KIT
400.0000 mg | PACK | Freq: Once | SUBCUTANEOUS | Status: AC
  Administered 2017-03-07: 400 mg via SUBCUTANEOUS

## 2017-03-07 MED ORDER — CERTOLIZUMAB PEGOL 2 X 200 MG/ML ~~LOC~~ KIT: 1.0000 | PACK | SUBCUTANEOUS | 2 refills | Status: DC

## 2017-03-07 NOTE — Progress Notes (Signed)
Office Visit Note  Patient: Jennifer [name]             Date of Birth: Aug 29, 1976           MRN: [id number]             PCP: Burnis Medin, MD Referring: Burnis Medin, MD Visit Date: 03/07/2017 Occupation: @GUAROCC @    Subjective:  Medication Management   History of Present Illness: Jennifer [name] is a 41 y.o. female  Last seen 10/03/2015 for rheumatoid arthritis with positive rheumatoid factor, positive CCP, positive HLA B-27.  Patient reports that she is doing really well with her Cimzia every 4 weeks. She does not have any joint pain, swelling, stiffness.  Her only complaint is that her knees are bothering her. She has a history of OA of bilateral knees. Lately, her left knee has started having some snapping and popping sounds. She did really well with her Hyalgan injection and completed it 05/03/2016. Patient is requesting to restart the series in [place] knees are bothering her.  She is due for repeat labs today including TB gold. Her last TB gold was negative 04/26/2016.   Activities of Daily Living:  Patient reports morning stiffness for 15 minutes.   Patient Denies nocturnal pain.  Difficulty dressing/grooming: Denies Difficulty climbing stairs: Reports Difficulty getting out of chair: Reports Difficulty using hands for taps, buttons, cutlery, and/or writing: Denies   Review of Systems  Constitutional: Negative for fatigue.  HENT: Negative for mouth sores and mouth dryness.   Eyes: Negative for dryness.  Respiratory: Negative for shortness of breath.   Gastrointestinal: Negative for constipation and diarrhea.  Musculoskeletal: Negative for myalgias and myalgias.  Skin: Negative for sensitivity to sunlight.  Psychiatric/Behavioral: Negative for decreased concentration and sleep disturbance.    PMFS History:  Patient Active Problem List   Diagnosis Date Noted  . Incisional hernia 12/18/2016  . Leiomyoma of uterus 09/06/2016  . Fibroid, uterine   .  Essential hypertension 05/25/2015  . Recurrent headache 05/25/2015  . Head lump 07/31/2014  . Edema 12/29/2013  . Baker's cyst, ruptured 12/25/2013  . Cough, persistent 12/12/2013  . Left leg swelling 12/12/2013  . Cough 12/12/2013  . High risk medication use 12/12/2013  . Recurrent periodic urticaria   . Rheumatoid arthritis ([place]) 08/28/2012  . CHRONIC LARYNGITIS 11/03/2010  . ALLERGIC RHINITIS 11/03/2010  . PARESTHESIA 07/28/2010  . DYSMENORRHEA 10/15/2007  . FIBROIDS, UTERUS 07/24/2007  . OBESITY 07/24/2007  . HYPERTENSION 07/24/2007  . SLEEPLESSNESS 07/24/2007  . HEADACHE 07/24/2007    Past Medical History:  Diagnosis Date  . Acute otitis media 08/28/2012   improved  change to liquid medication   . Anemia   . Breast abscess of female    Recurrent  . Family history of adverse reaction to anesthesia    father hard time waking once (12/20/2016)  . Fibroids    w/bleeding  . GERD (gastroesophageal reflux disease)   . Hypertension   . Migraines    Dr. Orie Rout; "I have a few/month" (12/20/2016)  . Pill esophagitis 08/28/2012   amoxicillin  by hx  disc plan nl voice except hoarse not drooling  close fu  if not getting better with plan stop aleve  liquid ibu onlyf necessary   . Recurrent periodic urticaria   . Rheumatoid arthritis ([place])    "55% of my body" (12/20/2016)    Family History  Problem Relation Age of Onset  . Hypertension Mother   .  Rheum arthritis Mother   . Hypertension Father    Past Surgical History:  Procedure Laterality Date  . BREAST SURGERY    . CYST EXCISION Left 2014   "elbow"  . HERNIA REPAIR    . INCISION AND DRAINAGE BREAST ABSCESS Left 2003  . INCISIONAL HERNIA REPAIR N/A 12/18/2016   Procedure: REPAIR INCISIONAL HERNIA WITH MESH;  Surgeon: Georganna Skeans, MD;  Location: [facility];  Service: General;  Laterality: N/A;  . INSERTION OF MESH N/A 12/18/2016   Procedure: INSERTION OF MESH;  Surgeon: Georganna Skeans, MD;  Location: [facility];  Service:  General;  Laterality: N/A;  . MYOMECTOMY N/A 09/06/2016   Procedure: Lendell Caprice;  Surgeon: Governor Specking, MD;  Location: [facility];  Service: Gynecology;  Laterality: N/A;  . UTERINE FIBROID SURGERY     Social History   Social History Narrative  . No narrative on file     Objective: Vital Signs: BP 134/78   Pulse 90   Resp 14   Ht 5' 4"  (1.626 m)   Wt 270 lb (122.5 kg)   LMP 02/12/2017 (Exact Date)   BMI 46.35 kg/m    Physical Exam  Constitutional: She is oriented to person, place, and time. She appears well-developed and well-nourished.  HENT:  Head: Normocephalic and atraumatic.  Eyes: EOM are normal. Pupils are equal, round, and reactive to light.  Cardiovascular: Normal rate, regular rhythm and normal heart sounds.  Exam reveals no gallop and no friction rub.   No murmur heard. Pulmonary/Chest: Effort normal and breath sounds normal. She has no wheezes. She has no rales.  Abdominal: Soft. Bowel sounds are normal. She exhibits no distension. There is no tenderness. There is no guarding. No hernia.  Musculoskeletal: Normal range of motion. She exhibits no edema, tenderness or deformity.  Lymphadenopathy:    She has no cervical adenopathy.  Neurological: She is alert and oriented to person, place, and time. Coordination normal.  Skin: Skin is warm and dry. Capillary refill takes less than 2 seconds. No rash noted.  Psychiatric: She has a normal mood and affect. Her behavior is normal.  Nursing note and vitals reviewed.    Musculoskeletal Exam:  Full range of motion of all joints Grip strength is equal and strong bilaterally Fibromyalgia tender points are all absent Main problem is stiffness in the knees.  CDAI Exam: CDAI Homunculus Exam:   Joint Counts:  CDAI Tender Joint count: 0 CDAI Swollen Joint count: 0  Global Assessments:  Patient Global Assessment: 2 Provider Global Assessment: 2  CDAI Calculated Score: 4    Investigation: No additional  findings.  Admission on 12/18/2016, Discharged on 12/21/2016  Component Date Value Ref Range Status  . WBC 12/19/2016 5.6  4.0 - 10.5 K/uL Final  . RBC 12/19/2016 3.87  3.87 - 5.11 MIL/uL Final  . Hemoglobin 12/19/2016 11.4* 12.0 - 15.0 g/dL Final  . HCT 12/19/2016 34.3* 36.0 - 46.0 % Final  . MCV 12/19/2016 88.6  78.0 - 100.0 fL Final  . MCH 12/19/2016 29.5  26.0 - 34.0 pg Final  . MCHC 12/19/2016 33.2  30.0 - 36.0 g/dL Final  . RDW 12/19/2016 14.3  11.5 - 15.5 % Final  . Platelets 12/19/2016 227  150 - 400 K/uL Final  . Sodium 12/19/2016 136  135 - 145 mmol/L Final  . Potassium 12/19/2016 3.6  3.5 - 5.1 mmol/L Final  . Chloride 12/19/2016 102  101 - 111 mmol/L Final  . CO2 12/19/2016 25  22 -  32 mmol/L Final  . Glucose, Bld 12/19/2016 94  65 - 99 mg/dL Final  . BUN 12/19/2016 <5* 6 - 20 mg/dL Final  . Creatinine, Ser 12/19/2016 0.84  0.44 - 1.00 mg/dL Final  . Calcium 12/19/2016 8.4* 8.9 - 10.3 mg/dL Final  . GFR calc non Af Amer 12/19/2016 >60  >60 mL/min Final  . GFR calc Af Amer 12/19/2016 >60  >60 mL/min Final   Comment: (NOTE) The eGFR has been calculated using the CKD EPI equation. This calculation has not been validated in all clinical situations. eGFR's persistently <60 mL/min signify possible Chronic Kidney Disease.   . Anion gap 12/19/2016 9  5 - 15 Final  . WBC 12/18/2016 4.9  4.0 - 10.5 K/uL Final  . RBC 12/18/2016 3.96  3.87 - 5.11 MIL/uL Final  . Hemoglobin 12/18/2016 11.7* 12.0 - 15.0 g/dL Final  . HCT 12/18/2016 34.9* 36.0 - 46.0 % Final  . MCV 12/18/2016 88.1  78.0 - 100.0 fL Final  . MCH 12/18/2016 29.5  26.0 - 34.0 pg Final  . MCHC 12/18/2016 33.5  30.0 - 36.0 g/dL Final  . RDW 12/18/2016 13.9  11.5 - 15.5 % Final  . Platelets 12/18/2016 242  150 - 400 K/uL Final  . Creatinine, Ser 12/18/2016 0.87  0.44 - 1.00 mg/dL Final  . GFR calc non Af Amer 12/18/2016 >60  >60 mL/min Final  . GFR calc Af Amer 12/18/2016 >60  >60 mL/min Final   Comment: (NOTE) The  eGFR has been calculated using the CKD EPI equation. This calculation has not been validated in all clinical situations. eGFR's persistently <60 mL/min signify possible Chronic Kidney Disease.   . WBC 12/20/2016 8.0  4.0 - 10.5 K/uL Final  . RBC 12/20/2016 3.86* 3.87 - 5.11 MIL/uL Final  . Hemoglobin 12/20/2016 11.1* 12.0 - 15.0 g/dL Final  . HCT 12/20/2016 34.3* 36.0 - 46.0 % Final  . MCV 12/20/2016 88.9  78.0 - 100.0 fL Final  . MCH 12/20/2016 28.8  26.0 - 34.0 pg Final  . MCHC 12/20/2016 32.4  30.0 - 36.0 g/dL Final  . RDW 12/20/2016 14.1  11.5 - 15.5 % Final  . Platelets 12/20/2016 239  150 - 400 K/uL Final  . Sodium 12/20/2016 132* 135 - 145 mmol/L Final  . Potassium 12/20/2016 3.5  3.5 - 5.1 mmol/L Final  . Chloride 12/20/2016 100* 101 - 111 mmol/L Final  . CO2 12/20/2016 26  22 - 32 mmol/L Final  . Glucose, Bld 12/20/2016 113* 65 - 99 mg/dL Final  . BUN 12/20/2016 9  6 - 20 mg/dL Final  . Creatinine, Ser 12/20/2016 0.95  0.44 - 1.00 mg/dL Final  . Calcium 12/20/2016 8.0* 8.9 - 10.3 mg/dL Final  . GFR calc non Af Amer 12/20/2016 >60  >60 mL/min Final  . GFR calc Af Amer 12/20/2016 >60  >60 mL/min Final   Comment: (NOTE) The eGFR has been calculated using the CKD EPI equation. This calculation has not been validated in all clinical situations. eGFR's persistently <60 mL/min signify possible Chronic Kidney Disease.   Georgiann Hahn gap 12/20/2016 6  5 - 15 Final  Hospital Outpatient Visit on 12/15/2016  Component Date Value Ref Range Status  . Sodium 12/15/2016 136  135 - 145 mmol/L Final  . Potassium 12/15/2016 3.6  3.5 - 5.1 mmol/L Final  . Chloride 12/15/2016 104  101 - 111 mmol/L Final  . CO2 12/15/2016 24  22 - 32 mmol/L Final  . Glucose, Bld 12/15/2016  111* 65 - 99 mg/dL Final  . BUN 12/15/2016 9  6 - 20 mg/dL Final  . Creatinine, Ser 12/15/2016 0.91  0.44 - 1.00 mg/dL Final  . Calcium 12/15/2016 8.8* 8.9 - 10.3 mg/dL Final  . GFR calc non Af Amer 12/15/2016 >60  >60  mL/min Final  . GFR calc Af Amer 12/15/2016 >60  >60 mL/min Final   Comment: (NOTE) The eGFR has been calculated using the CKD EPI equation. This calculation has not been validated in all clinical situations. eGFR's persistently <60 mL/min signify possible Chronic Kidney Disease.   . Anion gap 12/15/2016 8  5 - 15 Final  . WBC 12/15/2016 4.6  4.0 - 10.5 K/uL Final  . RBC 12/15/2016 3.92  3.87 - 5.11 MIL/uL Final  . Hemoglobin 12/15/2016 11.3* 12.0 - 15.0 g/dL Final  . HCT 12/15/2016 34.9* 36.0 - 46.0 % Final  . MCV 12/15/2016 89.0  78.0 - 100.0 fL Final  . MCH 12/15/2016 28.8  26.0 - 34.0 pg Final  . MCHC 12/15/2016 32.4  30.0 - 36.0 g/dL Final  . RDW 12/15/2016 14.2  11.5 - 15.5 % Final  . Platelets 12/15/2016 244  150 - 400 K/uL Final  . Preg, Serum 12/15/2016 NEGATIVE  NEGATIVE Final   Comment:        THE SENSITIVITY OF THIS METHODOLOGY IS >10 mIU/mL.   Admission on 12/07/2016, Discharged on 12/07/2016  Component Date Value Ref Range Status  . WBC 12/07/2016 7.6  4.0 - 10.5 K/uL Final  . RBC 12/07/2016 3.88  3.87 - 5.11 MIL/uL Final  . Hemoglobin 12/07/2016 11.4* 12.0 - 15.0 g/dL Final  . HCT 12/07/2016 34.3* 36.0 - 46.0 % Final  . MCV 12/07/2016 88.4  78.0 - 100.0 fL Final  . MCH 12/07/2016 29.4  26.0 - 34.0 pg Final  . MCHC 12/07/2016 33.2  30.0 - 36.0 g/dL Final  . RDW 12/07/2016 14.5  11.5 - 15.5 % Final  . Platelets 12/07/2016 296  150 - 400 K/uL Final  . Neutrophils Relative % 12/07/2016 70  % Final  . Neutro Abs 12/07/2016 5.2  1.7 - 7.7 K/uL Final  . Lymphocytes Relative 12/07/2016 21  % Final  . Lymphs Abs 12/07/2016 1.6  0.7 - 4.0 K/uL Final  . Monocytes Relative 12/07/2016 8  % Final  . Monocytes Absolute 12/07/2016 0.6  0.1 - 1.0 K/uL Final  . Eosinophils Relative 12/07/2016 1  % Final  . Eosinophils Absolute 12/07/2016 0.1  0.0 - 0.7 K/uL Final  . Basophils Relative 12/07/2016 0  % Final  . Basophils Absolute 12/07/2016 0.0  0.0 - 0.1 K/uL Final  . Sodium  12/07/2016 136  135 - 145 mmol/L Final  . Potassium 12/07/2016 4.1  3.5 - 5.1 mmol/L Final  . Chloride 12/07/2016 103  101 - 111 mmol/L Final  . CO2 12/07/2016 26  22 - 32 mmol/L Final  . Glucose, Bld 12/07/2016 108* 65 - 99 mg/dL Final  . BUN 12/07/2016 10  6 - 20 mg/dL Final  . Creatinine, Ser 12/07/2016 0.90  0.44 - 1.00 mg/dL Final  . Calcium 12/07/2016 9.1  8.9 - 10.3 mg/dL Final  . Total Protein 12/07/2016 8.6* 6.5 - 8.1 g/dL Final  . Albumin 12/07/2016 4.0  3.5 - 5.0 g/dL Final  . AST 12/07/2016 21  15 - 41 U/L Final  . ALT 12/07/2016 17  14 - 54 U/L Final  . Alkaline Phosphatase 12/07/2016 61  38 - 126 U/L Final  .  Total Bilirubin 12/07/2016 0.8  0.3 - 1.2 mg/dL Final  . GFR calc non Af Amer 12/07/2016 >60  >60 mL/min Final  . GFR calc Af Amer 12/07/2016 >60  >60 mL/min Final   Comment: (NOTE) The eGFR has been calculated using the CKD EPI equation. This calculation has not been validated in all clinical situations. eGFR's persistently <60 mL/min signify possible Chronic Kidney Disease.   . Anion gap 12/07/2016 7  5 - 15 Final  . Lipase 12/07/2016 22  11 - 51 U/L Final  . I-stat hCG, quantitative 12/07/2016 <5.0  <5 mIU/mL Final  . Comment 3 12/07/2016          Final   Comment:   GEST. AGE      CONC.  (mIU/mL)   <=1 WEEK        5 - 50     2 WEEKS       50 - 500     3 WEEKS       100 - 10,000     4 WEEKS     1,000 - 30,000        FEMALE AND NON-PREGNANT FEMALE:     LESS THAN 5 mIU/mL   . Color, Urine 12/07/2016 YELLOW  YELLOW Final  . APPearance 12/07/2016 CLEAR  CLEAR Final  . Specific Gravity, Urine 12/07/2016 1.013  1.005 - 1.030 Final  . pH 12/07/2016 8.0  5.0 - 8.0 Final  . Glucose, UA 12/07/2016 NEGATIVE  NEGATIVE mg/dL Final  . Hgb urine dipstick 12/07/2016 NEGATIVE  NEGATIVE Final  . Bilirubin Urine 12/07/2016 NEGATIVE  NEGATIVE Final  . Ketones, ur 12/07/2016 NEGATIVE  NEGATIVE mg/dL Final  . Protein, ur 12/07/2016 NEGATIVE  NEGATIVE mg/dL Final  . Nitrite  12/07/2016 NEGATIVE  NEGATIVE Final  . Leukocytes, UA 12/07/2016 NEGATIVE  NEGATIVE Final  Orders Only on 11/17/2016  Component Date Value Ref Range Status  . WBC 11/17/2016 7.8  3.8 - 10.8 K/uL Final  . RBC 11/17/2016 3.82  3.80 - 5.10 MIL/uL Final  . Hemoglobin 11/17/2016 11.0* 11.7 - 15.5 g/dL Final  . HCT 11/17/2016 34.5* 35.0 - 45.0 % Final  . MCV 11/17/2016 90.3  80.0 - 100.0 fL Final  . MCH 11/17/2016 28.8  27.0 - 33.0 pg Final  . MCHC 11/17/2016 31.9* 32.0 - 36.0 g/dL Final  . RDW 11/17/2016 14.3  11.0 - 15.0 % Final  . Platelets 11/17/2016 276  140 - 400 K/uL Final  . MPV 11/17/2016 9.8  7.5 - 12.5 fL Final  . Neutro Abs 11/17/2016 3588  1,500 - 7,800 cells/uL Final  . Lymphs Abs 11/17/2016 3042  850 - 3,900 cells/uL Final  . Monocytes Absolute 11/17/2016 1014* 200 - 950 cells/uL Final  . Eosinophils Absolute 11/17/2016 156  15 - 500 cells/uL Final  . Basophils Absolute 11/17/2016 0  0 - 200 cells/uL Final  . Neutrophils Relative % 11/17/2016 46  % Final  . Lymphocytes Relative 11/17/2016 39  % Final  . Monocytes Relative 11/17/2016 13  % Final  . Eosinophils Relative 11/17/2016 2  % Final  . Basophils Relative 11/17/2016 0  % Final  . Smear Review 11/17/2016 Criteria for review not met   Final  . Sodium 11/17/2016 137  135 - 146 mmol/L Final  . Potassium 11/17/2016 4.0  3.5 - 5.3 mmol/L Final  . Chloride 11/17/2016 106  98 - 110 mmol/L Final  . CO2 11/17/2016 23  20 - 31 mmol/L Final  . Glucose, Bld  11/17/2016 79  65 - 99 mg/dL Final  . BUN 11/17/2016 13  7 - 25 mg/dL Final  . Creat 11/17/2016 0.86  0.50 - 1.10 mg/dL Final  . Total Bilirubin 11/17/2016 0.4  0.2 - 1.2 mg/dL Final  . Alkaline Phosphatase 11/17/2016 57  33 - 115 U/L Final  . AST 11/17/2016 14  10 - 30 U/L Final  . ALT 11/17/2016 10  6 - 29 U/L Final  . Total Protein 11/17/2016 7.3  6.1 - 8.1 g/dL Final  . Albumin 11/17/2016 3.5* 3.6 - 5.1 g/dL Final  . Calcium 11/17/2016 8.6  8.6 - 10.2 mg/dL Final  .  GFR, Est African American 11/17/2016 >89  >=60 mL/min Final  . GFR, Est Non African American 11/17/2016 84  >=60 mL/min Final  Admission on 09/06/2016, Discharged on 09/10/2016  Component Date Value Ref Range Status  . Preg Test, Ur 09/06/2016 NEGATIVE  NEGATIVE Final   Comment:        THE SENSITIVITY OF THIS METHODOLOGY IS >20 mIU/mL.   . WBC 09/06/2016 18.0* 4.0 - 10.5 K/uL Final  . RBC 09/06/2016 3.76* 3.87 - 5.11 MIL/uL Final  . Hemoglobin 09/06/2016 11.4* 12.0 - 15.0 g/dL Final  . HCT 09/06/2016 33.6* 36.0 - 46.0 % Final  . MCV 09/06/2016 89.4  78.0 - 100.0 fL Final  . MCH 09/06/2016 30.3  26.0 - 34.0 pg Final  . MCHC 09/06/2016 33.9  30.0 - 36.0 g/dL Final  . RDW 09/06/2016 13.3  11.5 - 15.5 % Final  . Platelets 09/06/2016 235  150 - 400 K/uL Final  . WBC 09/07/2016 13.9* 4.0 - 10.5 K/uL Final  . RBC 09/07/2016 3.39* 3.87 - 5.11 MIL/uL Final  . Hemoglobin 09/07/2016 10.2* 12.0 - 15.0 g/dL Final  . HCT 09/07/2016 30.3* 36.0 - 46.0 % Final  . MCV 09/07/2016 89.4  78.0 - 100.0 fL Final  . MCH 09/07/2016 30.1  26.0 - 34.0 pg Final  . MCHC 09/07/2016 33.7  30.0 - 36.0 g/dL Final  . RDW 09/07/2016 13.2  11.5 - 15.5 % Final  . Platelets 09/07/2016 235  150 - 400 K/uL Final  . Sodium 09/06/2016 131* 135 - 145 mmol/L Final  . Potassium 09/06/2016 4.2  3.5 - 5.1 mmol/L Final  . Chloride 09/06/2016 101  101 - 111 mmol/L Final  . CO2 09/06/2016 24  22 - 32 mmol/L Final  . Glucose, Bld 09/06/2016 163* 65 - 99 mg/dL Final  . BUN 09/06/2016 11  6 - 20 mg/dL Final  . Creatinine, Ser 09/06/2016 0.94  0.44 - 1.00 mg/dL Final  . Calcium 09/06/2016 8.3* 8.9 - 10.3 mg/dL Final  . GFR calc non Af Amer 09/06/2016 >60  >60 mL/min Final  . GFR calc Af Amer 09/06/2016 >60  >60 mL/min Final   Comment: (NOTE) The eGFR has been calculated using the CKD EPI equation. This calculation has not been validated in all clinical situations. eGFR's persistently <60 mL/min signify possible Chronic  Kidney Disease.   . Anion gap 09/06/2016 6  5 - 15 Final  . Blood Product Unit Number 09/07/2016 [id number]   Final  . Unit Type and Rh 09/07/2016 5100   Final  . Blood Product Expiration Date 09/07/2016 [id number]   Final  . ISSUE DATE / TIME 09/07/2016 [id number]   Final  . Blood Product Unit Number 09/07/2016 [id number]   Final  . PRODUCT CODE 09/07/2016 [id number]   Final  . Unit Type and Rh 09/07/2016 5100  Final  . Blood Product Expiration Date 09/07/2016 [id number]   Final  . ISSUE DATE / TIME 09/07/2016 [id number]   Final  . Blood Product Unit Number 09/07/2016 [id number]   Final  . PRODUCT CODE 09/07/2016 [id number]   Final  . Unit Type and Rh 09/07/2016 5100   Final  . Blood Product Expiration Date 09/07/2016 [id number]   Final  . aPTT 09/07/2016 25  24 - 36 seconds Final  . Fibrinogen 09/07/2016 284  210 - 475 mg/dL Final  . Prothrombin Time 09/07/2016 15.3* 11.4 - 15.2 seconds Final  . INR 09/07/2016 1.20   Final  . Order Confirmation 09/07/2016 ORDER PROCESSED BY BLOOD BANK   Final  . WBC 09/07/2016 17.0* 4.0 - 10.5 K/uL Final  . RBC 09/07/2016 3.81* 3.87 - 5.11 MIL/uL Final  . Hemoglobin 09/07/2016 11.4* 12.0 - 15.0 g/dL Final  . HCT 09/07/2016 33.4* 36.0 - 46.0 % Final  . MCV 09/07/2016 87.7  78.0 - 100.0 fL Final  . MCH 09/07/2016 29.9  26.0 - 34.0 pg Final  . MCHC 09/07/2016 34.1  30.0 - 36.0 g/dL Final  . RDW 09/07/2016 14.6  11.5 - 15.5 % Final  . Platelets 09/07/2016 195  150 - 400 K/uL Final     Imaging: No results found.  Speciality Comments: No specialty comments available.    Procedures:  No procedures performed Allergies: No known allergies   Assessment / Plan:     Visit Diagnoses: High risk medication use - Plan: CBC with Differential/Platelet, COMPLETE METABOLIC PANEL WITH GFR, Quantiferon tb gold assay (blood), Certolizumab Pegol KIT 400 mg    #1: Rheumatoid arthritis No joint pain, stiffness, swelling Adequate results  with Cimzia. Last injection was 01/26/2017. Patient was unable to come in early part of June so we will do the Cimzia injection at today's visit  #2: High risk prescription Cimzia every 4 weeks Adequate response No joint pain, swelling, stiffness. Patient is doing really well with the Cimzia. She is content with the medication. The only pain that she has this to her knees which is osteoarthritic in nature and we will apply for Visco supplementation. Hyalgan is preferred but she is agreeable to try Euflex or Orthovisc if that's what her insurance company demands.  Patient is due for Cimzia injection today. We will give her shots in the office today  #3: Status post fibroid removal in fall of 2017.  #4: Status post hernia repair April/May 2018  #5: Due to the fibroids and hernia complications, patient is elected not to pursue pregnancy at this time.  #6: Osteoarthritis of bilateral knees, bilateral knee pain, last series of Hyalgan completed 05/03/2016. Patient is requesting repeat Visco supplementation as soon as possible. Patient is hearing snapping cracking sounds from her left knee. I sent a message to share in to apply for Visco supplementation as follows: Apply Hyalgan 5 bilateral knees; last series and did 05/03/2016; Euflexxa Orthovisc are acceptable alternatives  #7: CBC with differential, CMP with GFR, TB gold done today in the office. CBC with differential and CMP with GFR due in about 2 months  Orders: Orders Placed This Encounter  Procedures  . Quantiferon tb gold assay (blood)   Meds ordered this encounter  Medications  . Certolizumab Pegol KIT 400 mg    Face-to-face time spent with patient was 30 minutes. 50% of time was spent in counseling and coordination of care.  Follow-Up Instructions: No Follow-up on file.   Daunte Oestreich Carlyon Shadow, PA-C  I examined and  evaluated the patient with Eliezer Lofts PA.Patient had no synovitis on my exam.  The plan of care was  discussed as noted above.  Bo Merino, MD Note - This record has been created using Editor, commissioning.  Chart creation errors have been sought, but may not always  have been located. Such creation errors do not reflect on  the standard of medical care.

## 2017-03-08 ENCOUNTER — Other Ambulatory Visit: Payer: Self-pay | Admitting: Pharmacist

## 2017-03-08 MED ORDER — CERTOLIZUMAB PEGOL 2 X 200 MG ~~LOC~~ KIT: 400.0000 mg | PACK | SUBCUTANEOUS | 2 refills | Status: DC

## 2017-03-08 NOTE — Progress Notes (Signed)
Received fax from [facility] stating they received the prescription for patient's CImzia.  Patient's insurance requires prescription go through [facility].  I resent patient's prescription to [facility].  I called [facility] and spoke to [place] and cancelled Cimzia prescription.    Elisabeth Most, Pharm.D., BCPS, CPP Clinical Pharmacist Pager: [phone] Phone: [phone] 03/08/2017 9:33 AM

## 2017-03-08 NOTE — Progress Notes (Signed)
Mild anemia, rest normal

## 2017-03-09 LAB — QUANTIFERON TB GOLD ASSAY (BLOOD)
Interferon Gamma Release Assay: NEGATIVE
Mitogen-Nil: 7.68 IU/mL
QUANTIFERON TB AG MINUS NIL: 0.04 [IU]/mL
Quantiferon Nil Value: 0.11 IU/mL

## 2017-03-15 ENCOUNTER — Other Ambulatory Visit: Payer: Self-pay | Admitting: Internal Medicine

## 2017-03-23 ENCOUNTER — Telehealth: Payer: Self-pay | Admitting: *Deleted

## 2017-03-23 NOTE — Telephone Encounter (Signed)
Please schedule Hyalgan inj. x 5, buy and bill, Bilateral with Dr. Estanislado Pandy. Thank you.

## 2017-03-23 NOTE — Telephone Encounter (Signed)
Spoke with [place] and patient's Cimzia will be delivered 03/28/17.

## 2017-03-28 ENCOUNTER — Telehealth: Payer: Self-pay

## 2017-03-28 NOTE — Telephone Encounter (Signed)
Cimzia has been delivered from [place] via mail. Medication has been placed in the refrigerator.   Kristen Bushway, [place], CPhT 10:06 AM

## 2017-05-09 ENCOUNTER — Telehealth: Payer: Self-pay | Admitting: Rheumatology

## 2017-05-11 ENCOUNTER — Ambulatory Visit (INDEPENDENT_AMBULATORY_CARE_PROVIDER_SITE_OTHER): Payer: Commercial Managed Care - PPO | Admitting: *Deleted

## 2017-05-11 DIAGNOSIS — M0579 Rheumatoid arthritis with rheumatoid factor of multiple sites without organ or systems involvement: Secondary | ICD-10-CM | POA: Diagnosis not present

## 2017-05-11 MED ORDER — CERTOLIZUMAB PEGOL 2 X 200 MG/ML ~~LOC~~ KIT
400.0000 mg | PACK | Freq: Once | SUBCUTANEOUS | Status: AC
  Administered 2017-05-11: 400 mg via SUBCUTANEOUS

## 2017-05-11 NOTE — Progress Notes (Signed)
Patient in office for Cimzia Injection. Patient provided medication. Patient tolerated injection well. Patient was given injection in her lower right abdomen and left lower abdomen.   Administrations This Visit    Certolizumab Pegol KIT 400 mg    Admin Date 05/11/2017 Action Given Dose 400 mg Route Subcutaneous Administered By ,  L, LPN         

## 2017-05-16 NOTE — Telephone Encounter (Signed)
Opened in error

## 2017-05-22 DIAGNOSIS — M170 Bilateral primary osteoarthritis of knee: Secondary | ICD-10-CM | POA: Insufficient documentation

## 2017-05-22 NOTE — Progress Notes (Signed)
   Procedure Note  Patient: Jennifer [name]             Date of Birth: May 24, 1976           MRN: [id number]             Visit Date: 05/25/2017  Procedures: Visit Diagnoses: Primary osteoarthritis of both knees Hyalgan #1 bilateral knees  Large Joint Inj Date/Time: 05/25/2017 8:36 AM Performed by: Bo Merino Authorized by: Bo Merino   Consent Given by:  Patient Site marked: the procedure site was marked   Timeout: prior to procedure the correct patient, procedure, and site was verified   Indications:  Pain and joint swelling Location:  Knee Site:  R knee Prep: patient was prepped and draped in usual sterile fashion   Needle Size:  27 G Needle Length:  1.5 inches Approach:  Medial Ultrasound Guidance: No   Fluoroscopic Guidance: No   Arthrogram: No   Medications:  1.5 mL lidocaine 1 %; 20 mg Sodium Hyaluronate 20 MG/2ML Aspiration Attempted: Yes   Aspirate amount (mL):  0 Patient tolerance:  Patient tolerated the procedure well with no immediate complications Large Joint Inj Date/Time: 05/25/2017 8:37 AM Performed by: Bo Merino Authorized by: Bo Merino   Consent Given by:  Patient Site marked: the procedure site was marked   Timeout: prior to procedure the correct patient, procedure, and site was verified   Indications:  Pain and joint swelling Location:  Knee Site:  L knee Prep: patient was prepped and draped in usual sterile fashion   Needle Size:  27 G Needle Length:  1.5 inches Approach:  Medial Ultrasound Guidance: No   Fluoroscopic Guidance: No   Arthrogram: No   Medications:  1.5 mL lidocaine 1 %; 20 mg Sodium Hyaluronate 20 MG/2ML Aspiration Attempted: Yes   Aspirate amount (mL):  0 Patient tolerance:  Patient tolerated the procedure well with no immediate complications   Bo Merino, MD

## 2017-05-25 ENCOUNTER — Ambulatory Visit (INDEPENDENT_AMBULATORY_CARE_PROVIDER_SITE_OTHER): Payer: Commercial Managed Care - PPO | Admitting: Rheumatology

## 2017-05-25 DIAGNOSIS — M170 Bilateral primary osteoarthritis of knee: Secondary | ICD-10-CM | POA: Diagnosis not present

## 2017-05-25 MED ORDER — LIDOCAINE HCL 1 % IJ SOLN
1.5000 mL | INTRAMUSCULAR | Status: AC | PRN
  Administered 2017-05-25: 1.5 mL

## 2017-05-25 MED ORDER — SODIUM HYALURONATE (VISCOSUP) 20 MG/2ML IX SOSY
20.0000 mg | PREFILLED_SYRINGE | INTRA_ARTICULAR | Status: AC | PRN
  Administered 2017-05-25: 20 mg via INTRA_ARTICULAR

## 2017-05-31 ENCOUNTER — Ambulatory Visit (INDEPENDENT_AMBULATORY_CARE_PROVIDER_SITE_OTHER): Payer: Commercial Managed Care - PPO | Admitting: Rheumatology

## 2017-05-31 DIAGNOSIS — M1712 Unilateral primary osteoarthritis, left knee: Secondary | ICD-10-CM

## 2017-05-31 DIAGNOSIS — M170 Bilateral primary osteoarthritis of knee: Secondary | ICD-10-CM

## 2017-05-31 DIAGNOSIS — M1711 Unilateral primary osteoarthritis, right knee: Secondary | ICD-10-CM | POA: Diagnosis not present

## 2017-05-31 MED ORDER — LIDOCAINE HCL 1 % IJ SOLN
1.5000 mL | INTRAMUSCULAR | Status: AC | PRN
  Administered 2017-05-31: 1.5 mL

## 2017-05-31 MED ORDER — SODIUM HYALURONATE (VISCOSUP) 20 MG/2ML IX SOSY
20.0000 mg | PREFILLED_SYRINGE | INTRA_ARTICULAR | Status: AC | PRN
  Administered 2017-05-31: 20 mg via INTRA_ARTICULAR

## 2017-05-31 NOTE — Progress Notes (Signed)
   Procedure Note  Patient: Jennifer [name]             Date of Birth: February 17, 1976           MRN: [id number]             Visit Date: 05/31/2017  Procedures: Visit Diagnoses: Primary osteoarthritis of both knees hyalgan #2 Bilateral KJ B/B Large Joint Inj Date/Time: 05/31/2017 12:30 PM Performed by: Bo Merino Authorized by: Bo Merino   Consent Given by:  Patient Site marked: the procedure site was marked   Timeout: prior to procedure the correct patient, procedure, and site was verified   Indications:  Pain and joint swelling Location:  Knee Site:  R knee Prep: patient was prepped and draped in usual sterile fashion   Needle Size:  27 G Needle Length:  1.5 inches Approach:  Medial Ultrasound Guidance: No   Fluoroscopic Guidance: No   Arthrogram: No   Medications:  1.5 mL lidocaine 1 %; 20 mg Sodium Hyaluronate 20 MG/2ML Aspiration Attempted: Yes   Aspirate amount (mL):  0 Patient tolerance:  Patient tolerated the procedure well with no immediate complications Large Joint Inj Date/Time: 05/31/2017 12:31 PM Performed by: Bo Merino Authorized by: Bo Merino   Consent Given by:  Patient Site marked: the procedure site was marked   Timeout: prior to procedure the correct patient, procedure, and site was verified   Indications:  Pain and joint swelling Location:  Knee Site:  L knee Prep: patient was prepped and draped in usual sterile fashion   Needle Size:  27 G Needle Length:  1.5 inches Approach:  Medial Ultrasound Guidance: No   Fluoroscopic Guidance: No   Arthrogram: No   Medications:  1.5 mL lidocaine 1 %; 20 mg Sodium Hyaluronate 20 MG/2ML Aspiration Attempted: Yes   Aspirate amount (mL):  0 Patient tolerance:  Patient tolerated the procedure well with no immediate complications   Bo Merino, MD

## 2017-06-01 ENCOUNTER — Ambulatory Visit: Payer: Commercial Managed Care - PPO | Admitting: Rheumatology

## 2017-06-05 NOTE — Progress Notes (Deleted)
   Procedure Note  Patient: Jennifer [name]             Date of Birth: Jul 10, 1976           MRN: [id number]             Visit Date: 06/15/2017  Procedures: Visit Diagnoses: No diagnosis found. Hyalgan #4 bilateral  No procedures performed

## 2017-06-08 ENCOUNTER — Encounter: Payer: Self-pay | Admitting: Internal Medicine

## 2017-06-08 ENCOUNTER — Ambulatory Visit (INDEPENDENT_AMBULATORY_CARE_PROVIDER_SITE_OTHER): Payer: Commercial Managed Care - PPO | Admitting: Rheumatology

## 2017-06-08 DIAGNOSIS — M170 Bilateral primary osteoarthritis of knee: Secondary | ICD-10-CM

## 2017-06-08 DIAGNOSIS — M1711 Unilateral primary osteoarthritis, right knee: Secondary | ICD-10-CM

## 2017-06-08 DIAGNOSIS — M1712 Unilateral primary osteoarthritis, left knee: Secondary | ICD-10-CM | POA: Diagnosis not present

## 2017-06-08 MED ORDER — LIDOCAINE HCL 1 % IJ SOLN
1.5000 mL | INTRAMUSCULAR | Status: AC | PRN
  Administered 2017-06-08: 1.5 mL

## 2017-06-08 MED ORDER — SODIUM HYALURONATE (VISCOSUP) 20 MG/2ML IX SOSY
20.0000 mg | PREFILLED_SYRINGE | INTRA_ARTICULAR | Status: AC | PRN
  Administered 2017-06-08: 20 mg via INTRA_ARTICULAR

## 2017-06-08 NOTE — Progress Notes (Signed)
   Procedure Note  Patient: Jennifer [name]             Date of Birth: [birth year]           MRN: [id number]             Visit Date: 06/08/2017  Procedures: Visit Diagnoses: Primary osteoarthritis of both knees - Plan: Large Joint Injection/Arthrocentesis, Large Joint Injection/Arthrocentesis Hyalgan # 3 Bil knees  Large Joint Inj Date/Time: 06/08/2017 8:08 AM Performed by: Bo Merino Authorized by: Bo Merino   Consent Given by:  Patient Site marked: the procedure site was marked   Timeout: prior to procedure the correct patient, procedure, and site was verified   Indications:  Pain and joint swelling Location:  Knee Site:  R knee Prep: patient was prepped and draped in usual sterile fashion   Needle Size:  27 G Needle Length:  1.5 inches Approach:  Medial Ultrasound Guidance: No   Fluoroscopic Guidance: No   Arthrogram: No   Medications:  1.5 mL lidocaine 1 %; 20 mg Sodium Hyaluronate 20 MG/2ML Aspiration Attempted: Yes   Patient tolerance:  Patient tolerated the procedure well with no immediate complications Large Joint Inj Date/Time: 06/08/2017 8:11 AM Performed by: Bo Merino Authorized by: Bo Merino   Consent Given by:  Patient Site marked: the procedure site was marked   Timeout: prior to procedure the correct patient, procedure, and site was verified   Indications:  Pain and joint swelling Location:  Knee Site:  L knee Prep: patient was prepped and draped in usual sterile fashion   Needle Size:  27 G Needle Length:  1.5 inches Approach:  Medial Ultrasound Guidance: No   Fluoroscopic Guidance: No   Arthrogram: No   Medications:  1.5 mL lidocaine 1 %; 20 mg Sodium Hyaluronate 20 MG/2ML Aspiration Attempted: Yes   Patient tolerance:  Patient tolerated the procedure well with no immediate complications    Bo Merino, MD

## 2017-06-14 NOTE — Progress Notes (Signed)
   Procedure Note  Patient: Jennifer [name]             Date of Birth: [birth year]           MRN: [id number]             Visit Date: 06/22/2017  Procedures: Visit Diagnoses: Primary osteoarthritis of both knees - Plan: Large Joint Injection/Arthrocentesis, Large Joint Injection/Arthrocentesis Hyalgan #5 bilateral knees  Large Joint Inj Date/Time: 06/22/2017 8:03 AM Performed by: Bo Merino Authorized by: Bo Merino   Consent Given by:  Patient Site marked: the procedure site was marked   Timeout: prior to procedure the correct patient, procedure, and site was verified   Indications:  Pain Location:  Knee Site:  R knee Prep: patient was prepped and draped in usual sterile fashion   Needle Size:  27 G Needle Length:  1.5 inches Ultrasound Guidance: No   Fluoroscopic Guidance: No   Arthrogram: No   Medications:  1.5 mL lidocaine 1 %; 20 mg Sodium Hyaluronate 20 MG/2ML Aspiration Attempted: Yes   Patient tolerance:  Patient tolerated the procedure well with no immediate complications Large Joint Inj Date/Time: 06/22/2017 8:04 AM Performed by: Bo Merino Authorized by: Bo Merino   Consent Given by:  Patient Site marked: the procedure site was marked   Timeout: prior to procedure the correct patient, procedure, and site was verified   Indications:  Pain Location:  Knee Site:  L knee Prep: patient was prepped and draped in usual sterile fashion   Needle Size:  27 G Needle Length:  1.5 inches Ultrasound Guidance: No   Fluoroscopic Guidance: No   Arthrogram: No   Medications:  1.5 mL lidocaine 1 %; 20 mg Sodium Hyaluronate 20 MG/2ML Aspiration Attempted: Yes   Patient tolerance:  Patient tolerated the procedure well with no immediate complications   Bo Merino, MD

## 2017-06-15 ENCOUNTER — Ambulatory Visit (INDEPENDENT_AMBULATORY_CARE_PROVIDER_SITE_OTHER): Payer: Commercial Managed Care - PPO | Admitting: Rheumatology

## 2017-06-15 DIAGNOSIS — M1712 Unilateral primary osteoarthritis, left knee: Secondary | ICD-10-CM

## 2017-06-15 DIAGNOSIS — M1711 Unilateral primary osteoarthritis, right knee: Secondary | ICD-10-CM | POA: Diagnosis not present

## 2017-06-15 DIAGNOSIS — M170 Bilateral primary osteoarthritis of knee: Secondary | ICD-10-CM

## 2017-06-15 MED ORDER — LIDOCAINE HCL 1 % IJ SOLN
1.5000 mL | INTRAMUSCULAR | Status: AC | PRN
  Administered 2017-06-15: 1.5 mL

## 2017-06-15 MED ORDER — SODIUM HYALURONATE (VISCOSUP) 20 MG/2ML IX SOSY
20.0000 mg | PREFILLED_SYRINGE | INTRA_ARTICULAR | Status: AC | PRN
  Administered 2017-06-15: 20 mg via INTRA_ARTICULAR

## 2017-06-15 NOTE — Progress Notes (Signed)
   Procedure Note  Patient: Jennifer [name]             Date of Birth: 07/09/1976           MRN: [id number]             Visit Date: 06/15/2017  Procedures: Visit Diagnoses: Bilateral primary osteoarthritis of knee - Plan: Large Joint Injection/Arthrocentesis, Large Joint Injection/Arthrocentesis   Hyalgan, # 4 , BIL, buy and bill  Large Joint Inj Date/Time: 06/15/2017 8:48 AM Performed by: Bo Merino Authorized by: Bo Merino   Consent Given by:  Patient Site marked: the procedure site was marked   Timeout: prior to procedure the correct patient, procedure, and site was verified   Indications:  Pain and joint swelling Location:  Knee Site:  R knee Prep: patient was prepped and draped in usual sterile fashion   Needle Size:  27 G Needle Length:  1.5 inches Approach:  Medial Ultrasound Guidance: No   Fluoroscopic Guidance: No   Arthrogram: No   Medications:  1.5 mL lidocaine 1 %; 20 mg Sodium Hyaluronate 20 MG/2ML Aspiration Attempted: Yes   Patient tolerance:  Patient tolerated the procedure well with no immediate complications Large Joint Inj Date/Time: 06/15/2017 8:48 AM Performed by: Bo Merino Authorized by: Bo Merino   Consent Given by:  Patient Site marked: the procedure site was marked   Timeout: prior to procedure the correct patient, procedure, and site was verified   Indications:  Pain and joint swelling Location:  Knee Site:  L knee Prep: patient was prepped and draped in usual sterile fashion   Needle Size:  27 G Needle Length:  1.5 inches Approach:  Medial Ultrasound Guidance: No   Fluoroscopic Guidance: No   Arthrogram: No   Medications:  1.5 mL lidocaine 1 %; 20 mg Sodium Hyaluronate 20 MG/2ML Aspiration Attempted: Yes   Patient tolerance:  Patient tolerated the procedure well with no immediate complications    Bo Merino, MD

## 2017-06-22 ENCOUNTER — Telehealth: Payer: Self-pay | Admitting: Rheumatology

## 2017-06-22 ENCOUNTER — Ambulatory Visit (INDEPENDENT_AMBULATORY_CARE_PROVIDER_SITE_OTHER): Payer: Commercial Managed Care - PPO | Admitting: Rheumatology

## 2017-06-22 DIAGNOSIS — M1712 Unilateral primary osteoarthritis, left knee: Secondary | ICD-10-CM

## 2017-06-22 DIAGNOSIS — M1711 Unilateral primary osteoarthritis, right knee: Secondary | ICD-10-CM | POA: Diagnosis not present

## 2017-06-22 DIAGNOSIS — M170 Bilateral primary osteoarthritis of knee: Secondary | ICD-10-CM

## 2017-06-22 MED ORDER — SODIUM HYALURONATE (VISCOSUP) 20 MG/2ML IX SOSY
20.0000 mg | PREFILLED_SYRINGE | INTRA_ARTICULAR | Status: AC | PRN
  Administered 2017-06-22: 20 mg via INTRA_ARTICULAR

## 2017-06-22 MED ORDER — LIDOCAINE HCL 1 % IJ SOLN
1.5000 mL | INTRAMUSCULAR | Status: AC | PRN
  Administered 2017-06-22: 1.5 mL

## 2017-06-22 MED ORDER — CERTOLIZUMAB PEGOL 2 X 200 MG ~~LOC~~ KIT: 400.0000 mg | PACK | SUBCUTANEOUS | 2 refills | Status: DC

## 2017-06-22 NOTE — Telephone Encounter (Signed)
It is not here

## 2017-06-22 NOTE — Telephone Encounter (Signed)
Patient would like to know if Jennifer [name] is here? If so, I will schedule nurse appointment.

## 2017-06-22 NOTE — Telephone Encounter (Signed)
Patient requesting rx for Cimzia be sent into [facility]. Please call to advise.

## 2017-06-22 NOTE — Telephone Encounter (Signed)
Last Visit: 03/07/17 Next Visit: 08/15/17 Labs 03/07/17  Mild anemia, rest normal          Patient to come and update labs today.   Okay to refill per Dr. Estanislado Pandy

## 2017-06-26 ENCOUNTER — Other Ambulatory Visit: Payer: Self-pay

## 2017-06-26 DIAGNOSIS — Z79899 Other long term (current) drug therapy: Secondary | ICD-10-CM

## 2017-06-26 LAB — CBC WITH DIFFERENTIAL/PLATELET
BASOS ABS: 31 {cells}/uL (ref 0–200)
Basophils Relative: 0.7 %
EOS ABS: 128 {cells}/uL (ref 15–500)
EOS PCT: 2.9 %
HCT: 38.3 % (ref 35.0–45.0)
Hemoglobin: 12.9 g/dL (ref 11.7–15.5)
Lymphs Abs: 2152 cells/uL (ref 850–3900)
MCH: 30.5 pg (ref 27.0–33.0)
MCHC: 33.7 g/dL (ref 32.0–36.0)
MCV: 90.5 fL (ref 80.0–100.0)
MONOS PCT: 16.3 %
MPV: 10.1 fL (ref 7.5–12.5)
NEUTROS ABS: 1373 {cells}/uL — AB (ref 1500–7800)
NEUTROS PCT: 31.2 %
PLATELETS: 299 10*3/uL (ref 140–400)
RBC: 4.23 10*6/uL (ref 3.80–5.10)
RDW: 11.9 % (ref 11.0–15.0)
TOTAL LYMPHOCYTE: 48.9 %
WBC mixed population: 717 cells/uL (ref 200–950)
WBC: 4.4 10*3/uL (ref 3.8–10.8)

## 2017-06-26 LAB — COMPLETE METABOLIC PANEL WITH GFR
AG RATIO: 1.1 (calc) (ref 1.0–2.5)
ALBUMIN MSPROF: 4 g/dL (ref 3.6–5.1)
ALKALINE PHOSPHATASE (APISO): 63 U/L (ref 33–115)
ALT: 9 U/L (ref 6–29)
AST: 14 U/L (ref 10–30)
BUN: 12 mg/dL (ref 7–25)
CHLORIDE: 104 mmol/L (ref 98–110)
CO2: 28 mmol/L (ref 20–32)
CREATININE: 0.96 mg/dL (ref 0.50–1.10)
Calcium: 9.3 mg/dL (ref 8.6–10.2)
GFR, Est African American: 85 mL/min/{1.73_m2} (ref >=60)
GFR, Est Non African American: 73 mL/min/{1.73_m2} (ref >=60)
GLUCOSE: 92 mg/dL (ref 65–99)
Globulin: 3.7 g/dL (calc) (ref 1.9–3.7)
POTASSIUM: 4.6 mmol/L (ref 3.5–5.3)
SODIUM: 139 mmol/L (ref 135–146)
Total Bilirubin: 0.5 mg/dL (ref 0.2–1.2)
Total Protein: 7.7 g/dL (ref 6.1–8.1)

## 2017-06-26 NOTE — Progress Notes (Signed)
wnl

## 2017-06-27 ENCOUNTER — Telehealth: Payer: Self-pay | Admitting: Rheumatology

## 2017-06-27 ENCOUNTER — Ambulatory Visit: Payer: Commercial Managed Care - PPO

## 2017-06-27 ENCOUNTER — Other Ambulatory Visit: Payer: Self-pay | Admitting: Internal Medicine

## 2017-06-27 ENCOUNTER — Encounter: Payer: Self-pay | Admitting: *Deleted

## 2017-06-27 NOTE — Telephone Encounter (Signed)
Appeal letter written

## 2017-06-27 NOTE — Telephone Encounter (Signed)
Patient spoke with [facility]. They have rx for Cimzia, but need prior authorization.

## 2017-06-27 NOTE — Telephone Encounter (Signed)
A prior authorization for Cimzia has been submitted to patients insurance via cover my meds.   Received confirmation from Cover My Meds regarding a prior authorization DENIAL for [place].   Reference [id number] Phone number:[phone] Fax number: [phone]  Patient has been on medication 02/17/2013. Appeal? Appeals can be mailed to   Attn: [place] [address] Fax: [phone]  Will send document to scan center.  Jennamarie Goings, [place], CPhT 2:14 PM

## 2017-06-27 NOTE — Telephone Encounter (Signed)
Patient advised the message has been sent to [facility] and she would work on the [place].

## 2017-06-28 ENCOUNTER — Telehealth: Payer: Self-pay

## 2017-06-28 NOTE — Telephone Encounter (Signed)
Appeal letter for Jennifer [name] has been submitted to the Appeals Department (Fax: [phone], Phone: [phone]) along with last office visit notes. Will update once we receive a response.   Will send document to scan center.  Tyniah Kastens, [place], CPhT 9:31 AM

## 2017-06-28 NOTE — Telephone Encounter (Signed)
Jennifer [name] with [facility] called stating that Jennifer [name] is not covered by patient insurance and stated that there are alternative medications, but a prior authorization will be required.  Cb# is [phone]. Reference# is [id number].

## 2017-06-28 NOTE — Telephone Encounter (Signed)
ok 

## 2017-06-28 NOTE — Telephone Encounter (Signed)
Called [facility] to inform them of the appeal letter that was faxed. Spoke to [employer] who states that the appeal fax has been received and it is being process. The appeal could take up until 07/13/17, but should be sooner. We will be notified by fax. Will update once we receive a response.  Called patient to update her. She was due for her next injection yesterday. She is all out of meds. She states that she normally come to the clinic to be injected each month. Can we give her a sample to be injected in the clinic? Thanks!  Febe Champa, [place], CPhT 2:43 PM

## 2017-06-29 NOTE — Telephone Encounter (Signed)
Patient advised that we could provide sample. Appointment schedule for 07/02/17 at 10 am

## 2017-07-02 ENCOUNTER — Ambulatory Visit (INDEPENDENT_AMBULATORY_CARE_PROVIDER_SITE_OTHER): Payer: Commercial Managed Care - PPO | Admitting: *Deleted

## 2017-07-02 DIAGNOSIS — M0579 Rheumatoid arthritis with rheumatoid factor of multiple sites without organ or systems involvement: Secondary | ICD-10-CM

## 2017-07-02 MED ORDER — CERTOLIZUMAB PEGOL 2 X 200 MG/ML ~~LOC~~ KIT
400.0000 mg | PACK | Freq: Once | SUBCUTANEOUS | Status: AC
  Administered 2017-07-02: 400 mg via SUBCUTANEOUS

## 2017-07-02 NOTE — Progress Notes (Signed)
Patient in office for a Cimzia injection. Patient was given injection in left and right lower abdomen. Patient tolerated injections well. Patient was provided with a sample.   Administrations This Visit    Certolizumab Pegol KIT 400 mg    Admin Date 07/02/2017 Action Given Dose 400 mg Route Subcutaneous Administered By Carole Binning, LPN

## 2017-07-06 NOTE — Telephone Encounter (Signed)
Called to check the states of patients appeal. Spoke with representative, Samuel Germany., who states that the appeal is still pending. It can take up to 30 days but we could here a response sooner.   Will check back and update once we receive a response.   Called patient to update. Did not get an answer. Left message.   Deuce Paternoster, [place], CPhT 11:45 AM

## 2017-07-09 NOTE — Telephone Encounter (Signed)
Received a fax from [employer] regarding an appeal authorization [place] for [facility] because the medication is non-formulary and is only covered when documentation has been provided that the patient tried two of the following: Actemra, Enbrel and Humira and has failed Morrie Sheldon. Patient has been on Cimzia since 02/2013. Called the appeals department to verify.   Spoke with Maudie Mercury who states that the patient does have the option for a level 2 appeal. She will fax the authorization document to be filled out. There is about a 15 day turnaround time.    Reference [id number] Phone number: [phone]  Will send document to scan center  Demetrios Loll, CPhT 4:18 PM

## 2017-07-10 ENCOUNTER — Encounter: Payer: Self-pay | Admitting: *Deleted

## 2017-07-10 NOTE — Telephone Encounter (Signed)
The second level appeal was faxed to [place] Department as an urgent request. Turn around time is about 72 hours per representative, Sherrita. Will update once we receive a response.   Phone: [phone] Fax: [phone] Reference number: [id number]  Will send document to scan center.  Called patient to update. Left voice message.  Jennifer [name], [place], CPhT 10:06 AM

## 2017-07-12 NOTE — Telephone Encounter (Signed)
Received a fax from [place] stating that the 2nd level appeal had been denied for the same reasons. Spoke with a representative who states the the patient is eligible for an external appeal through [place]. Attached to the denial letter was the appeal form. The denial letter and any relevant information will be attached and faxed for review as an urgent request to prevent any lag in therapy for the patient. Patient will be required to sign the document for authorization. We will not be able to send request without her signature. If the urgent request is accepted, we should have a response within 72 business hours.   Phone: [phone] Fax: [phone]   Will send documents to scan center.  Called patient to update. Have been unsuccessful reaching the patient on her temporary home phone. Left message on home phone and work phone.

## 2017-07-13 NOTE — Telephone Encounter (Signed)
Patient returned call. She states that she is working out of town today. She is requesting that the document be emailed to her to collect her signature at [email or web address]. She will sign the document and send it back to use for processing.   Document was emailed to patient.   Document was received with patients signature. It has been faxed to:  [place] appeal program Phone: [phone] Fax: [phone]  Will update once we receive a response.   Teegan Guinther, [place], CPhT 12:37 PM

## 2017-07-24 ENCOUNTER — Telehealth: Payer: Self-pay

## 2017-07-24 NOTE — Telephone Encounter (Signed)
Okay to schedule

## 2017-07-24 NOTE — Telephone Encounter (Signed)
Dr. Deneise Lever will be doing peer to peer with Dr. Estanislado Pandy by August 20, 2017 5pm [place] time.  Cb# [phone].  Thank you.

## 2017-07-30 NOTE — Telephone Encounter (Signed)
Peer to peer review should bee 11/13 or 11/14. Remember patient is planning pregnancy.

## 2017-08-05 NOTE — Progress Notes (Deleted)
Office Visit Note  Patient: Jennifer [name]             Date of Birth: [birth year]           MRN: [id number]             PCP: Burnis Medin, MD Referring: Burnis Medin, MD Visit Date: 08/15/2017 Occupation: @GUAROCC @    Subjective:  No chief complaint on file.   History of Present Illness: Jennifer [name] is a 41 y.o. female ***   Activities of Daily Living:  Patient reports morning stiffness for *** {minute/hour:19697}.   Patient {ACTIONS;DENIES/REPORTS:21021675::"Denies"} nocturnal pain.  Difficulty dressing/grooming: {ACTIONS;DENIES/REPORTS:21021675::"Denies"} Difficulty climbing stairs: {ACTIONS;DENIES/REPORTS:21021675::"Denies"} Difficulty getting out of chair: {ACTIONS;DENIES/REPORTS:21021675::"Denies"} Difficulty using hands for taps, buttons, cutlery, and/or writing: {ACTIONS;DENIES/REPORTS:21021675::"Denies"}   No Rheumatology ROS completed.   PMFS History:  Patient Active Problem List   Diagnosis Date Noted  . Primary osteoarthritis of both knees 05/22/2017  . Incisional hernia 12/18/2016  . Leiomyoma of uterus 09/06/2016  . Fibroid, uterine   . Essential hypertension 05/25/2015  . Recurrent headache 05/25/2015  . Head lump 07/31/2014  . Edema 12/29/2013  . Baker's cyst, ruptured 12/25/2013  . Cough, persistent 12/12/2013  . Left leg swelling 12/12/2013  . Cough 12/12/2013  . High risk medication use 12/12/2013  . Recurrent periodic urticaria   . Rheumatoid arthritis ([place]) 08/28/2012  . CHRONIC LARYNGITIS 11/03/2010  . ALLERGIC RHINITIS 11/03/2010  . PARESTHESIA 07/28/2010  . DYSMENORRHEA 10/15/2007  . FIBROIDS, UTERUS 07/24/2007  . OBESITY 07/24/2007  . HYPERTENSION 07/24/2007  . SLEEPLESSNESS 07/24/2007  . HEADACHE 07/24/2007    Past Medical History:  Diagnosis Date  . Acute otitis media 08/28/2012   improved  change to liquid medication   . Anemia   . Breast abscess of female    Recurrent  . Family history of adverse reaction to  anesthesia    father hard time waking once (12/20/2016)  . Fibroids    w/bleeding  . GERD (gastroesophageal reflux disease)   . Hypertension   . Migraines    Dr. Orie Rout; "I have a few/month" (12/20/2016)  . Pill esophagitis 08/28/2012   amoxicillin  by hx  disc plan nl voice except hoarse not drooling  close fu  if not getting better with plan stop aleve  liquid ibu onlyf necessary   . Recurrent periodic urticaria   . Rheumatoid arthritis ([place])    "55% of my body" (12/20/2016)    Family History  Problem Relation Age of Onset  . Hypertension Mother   . Rheum arthritis Mother   . Hypertension Father    Past Surgical History:  Procedure Laterality Date  . ABOMINAL MYOMECTOMY N/A 09/06/2016   Performed by Governor Specking, MD at [facility] ORS  . BREAST SURGERY    . CYST EXCISION Left 2014   "elbow"  . HERNIA REPAIR    . INCISION AND DRAINAGE BREAST ABSCESS Left 2003  . INSERTION OF MESH N/A 12/18/2016   Performed by Georganna Skeans, MD at [facility]  . REPAIR INCISIONAL HERNIA WITH MESH N/A 12/18/2016   Performed by Georganna Skeans, MD at [facility] History   Social History Narrative  . Not on file     Objective: Vital Signs: There were no vitals taken for this visit.   Physical Exam   Musculoskeletal Exam: ***  CDAI Exam: No CDAI exam completed.    Investigation: No additional findings.Tb Gold: 03/07/2017  Negative  CBC Latest Ref Rng & Units 06/26/2017 03/07/2017 12/20/2016  WBC 3.8 - 10.8 Thousand/uL 4.4 5.8 8.0  Hemoglobin 11.7 - 15.5 g/dL 12.9 11.5(L) 11.1(L)  Hematocrit 35.0 - 45.0 % 38.3 36.2 34.3(L)  Platelets 140 - 400 Thousand/uL 299 300 239   CMP Latest Ref Rng & Units 06/26/2017 03/07/2017 12/20/2016  Glucose 65 - 99 mg/dL 92 88 113(H)  BUN 7 - 25 mg/dL 12 11 9   Creatinine 0.50 - 1.10 mg/dL 0.96 0.91 0.95  Sodium 135 - 146 mmol/L 139 139 132(L)  Potassium 3.5 - 5.3 mmol/L 4.6 4.1 3.5  Chloride 98 - 110 mmol/L 104 106 100(L)  CO2  20 - 32 mmol/L 28 24 26   Calcium 8.6 - 10.2 mg/dL 9.3 8.7 8.0(L)  Total Protein 6.1 - 8.1 g/dL 7.7 7.3 -  Total Bilirubin 0.2 - 1.2 mg/dL 0.5 0.4 -  Alkaline Phos 33 - 115 U/L - 60 -  AST 10 - 30 U/L 14 12 -  ALT 6 - 29 U/L 9 9 -    Imaging: No results found.  Speciality Comments: No specialty comments available.    Procedures:  No procedures performed Allergies: No known allergies   Assessment / Plan:     Visit Diagnoses: No diagnosis found.    Orders: No orders of the defined types were placed in this encounter.  No orders of the defined types were placed in this encounter.   Face-to-face time spent with patient was *** minutes. 50% of time was spent in counseling and coordination of care.  Follow-Up Instructions: No Follow-up on file.   Earnestine Mealing, CMA  Note - This record has been created using Editor, commissioning.  Chart creation errors have been sought, but may not always  have been located. Such creation errors do not reflect on  the standard of medical care.

## 2017-08-15 ENCOUNTER — Ambulatory Visit: Payer: Commercial Managed Care - PPO | Admitting: Rheumatology

## 2017-08-16 ENCOUNTER — Telehealth: Payer: Self-pay

## 2017-08-16 NOTE — Telephone Encounter (Signed)
Called [facility] appeal program to check the status of patient appeal for Cimzia. Spoke with Cloyde Reams who states that the patients level 1 and level 2 appeal has been denied. It is now under a level 3 appeal. The level 3 appeal was received October 26th. It can take up to 45 days before we hear a response from the time it was received. We will receive a response via fax.   Called patient to update. Her last injection was given in the clinic on 10/15 with a sample. She is past due for a dose. I called Cimzia manufacture assistance to see if they could provide anything about helping her get coverage or assistance. Will update her once we hear a response. She states that the last time she had to do a  PA for Cimzia it went through a level 3 appeal. Can we provide another sample for patient to inject in the clinic until we hear from her insurance? Thanks!  Jawanza Zambito, [place], CPhT 11:20 AM

## 2017-08-16 NOTE — Telephone Encounter (Signed)
Patient advised that we will be able to provide her with a sample of Cimzia. Patient is currently sick and will call the office to schedule appointment when feeling better.

## 2017-08-16 NOTE — Telephone Encounter (Signed)
ok 

## 2017-08-30 MED ORDER — CERTOLIZUMAB PEGOL 2 X 200 MG ~~LOC~~ KIT: 400.0000 mg | PACK | SUBCUTANEOUS | 2 refills | Status: DC

## 2017-08-30 NOTE — Telephone Encounter (Signed)
Patient advised that Magnus Sinning has been approved through the insurance. Patient advised prescription sent to [facility]. Will schedule patient when she is well and prescription has been delivered to the office.

## 2017-08-30 NOTE — Addendum Note (Signed)
Addended by: Carole Binning on: 08/30/2017 03:59 PM   Modules accepted: Orders

## 2017-08-30 NOTE — Telephone Encounter (Signed)
Called [facility] to check the status of patients appeal. Spoke with Melissa who states that the appeal has been approved at a Level 3 appeal. It has been approved from 07/28/2017 through 08/28/2018. A copy of the letter was faxed to clinic. Will send documents to scan center once received.   Authorization number: [id number] Phone Number: [phone]  Called patient to update. Patient will need new Rx sent to [facility]. She is due for an injection now. Please send in new Rx and schedule nursing visit for next week if possible. Thanks!  Viviana Trimble, [place], CPhT 3:30 PM

## 2017-09-04 ENCOUNTER — Telehealth: Payer: Self-pay

## 2017-09-04 NOTE — Telephone Encounter (Signed)
Patient advised we have not  received anything form [facility]. Patient states she will contact them and have them send the form.

## 2017-09-04 NOTE — Telephone Encounter (Signed)
Coordinated delivery of Cimzia for patient from [facility]. Spoke with Lennette Bihari who states that the medication will be delivered on 09/06/2017. Please schedule nursing visit once medication has been received.   Waverly Tarquinio, [place], CPhT 3:48 PM

## 2017-09-04 NOTE — Telephone Encounter (Signed)
Will call patient to schedule nurse visit once the medication is received in office.

## 2017-09-04 NOTE — Telephone Encounter (Signed)
Patient called stating that [facility] faxed a form for Dr. Estanislado Pandy to sign and send back to [facility].  Form needs to be signed so patient can receive her Rx for Cimzia.  Cb# is [phone].  Please advise.  Thank you.

## 2017-09-26 ENCOUNTER — Ambulatory Visit: Payer: Commercial Managed Care - PPO | Admitting: *Deleted

## 2017-09-26 DIAGNOSIS — M0579 Rheumatoid arthritis with rheumatoid factor of multiple sites without organ or systems involvement: Secondary | ICD-10-CM

## 2017-09-26 MED ORDER — CERTOLIZUMAB PEGOL 2 X 200 MG/ML ~~LOC~~ KIT
400.0000 mg | PACK | Freq: Once | SUBCUTANEOUS | Status: AC
  Administered 2017-09-26: 400 mg via SUBCUTANEOUS

## 2017-09-26 NOTE — Progress Notes (Signed)
Patient in office for a Cimzia injection. Patient had to hold Cimzia due to being sick. Patient was provided with a sample as her cimzia prescription that was shipped to the office was defective. Will contact the company for replacement. Patient was given injections in right and left lower abdomen and she tolerated injections well.   Administrations This Visit    Certolizumab Pegol KIT 400 mg    Admin Date 09/26/2017 Action Given Dose 400 mg Route Subcutaneous Administered By Carole Binning, LPN

## 2017-09-28 DIAGNOSIS — G4733 Obstructive sleep apnea (adult) (pediatric): Secondary | ICD-10-CM | POA: Insufficient documentation

## 2017-10-30 ENCOUNTER — Telehealth: Payer: Self-pay | Admitting: Rheumatology

## 2017-10-30 NOTE — Telephone Encounter (Signed)
Patient has ordered Cimzia and it should be delivered to our office on November 01, 2017. Patient advised once medication is received we will schedule appointment.   Patient would also like her Hylagan order

## 2017-10-30 NOTE — Telephone Encounter (Signed)
Patient called to request a nurse visit for her Cimzia injection.  Patient also requested that we order her Hyalgan injections.

## 2017-11-02 ENCOUNTER — Telehealth: Payer: Self-pay

## 2017-11-02 NOTE — Telephone Encounter (Signed)
Received Cimizia from pharmacy for pt. Meds have been placed in the refrigerator. Please schedule nursing visit. Thanks!  Joani Cosma, [place], CPhT 8:03 AM

## 2017-11-02 NOTE — Telephone Encounter (Signed)
Patient has been scheduled for November 07, 2017 at 2 pm.

## 2017-11-07 ENCOUNTER — Ambulatory Visit: Payer: Commercial Managed Care - PPO | Admitting: *Deleted

## 2017-11-07 DIAGNOSIS — M0579 Rheumatoid arthritis with rheumatoid factor of multiple sites without organ or systems involvement: Secondary | ICD-10-CM | POA: Diagnosis not present

## 2017-11-07 MED ORDER — CERTOLIZUMAB PEGOL 2 X 200 MG/ML ~~LOC~~ KIT
400.0000 mg | PACK | Freq: Once | SUBCUTANEOUS | Status: AC
  Administered 2017-11-07: 400 mg via SUBCUTANEOUS

## 2017-11-07 NOTE — Progress Notes (Signed)
Patient in office for Cimzia injection. Patient was given injection in  Right and left lower abdomen. Patient provided medication. Patient tolerated injection well.   Administrations This Visit    Certolizumab Pegol KIT 400 mg    Admin Date 11/07/2017 Action Given Dose 400 mg Route Subcutaneous Administered By Carole Binning, LPN

## 2017-11-27 ENCOUNTER — Ambulatory Visit (INDEPENDENT_AMBULATORY_CARE_PROVIDER_SITE_OTHER): Payer: Self-pay

## 2017-11-27 ENCOUNTER — Ambulatory Visit (INDEPENDENT_AMBULATORY_CARE_PROVIDER_SITE_OTHER): Payer: Commercial Managed Care - PPO | Admitting: Orthopaedic Surgery

## 2017-11-27 ENCOUNTER — Encounter (INDEPENDENT_AMBULATORY_CARE_PROVIDER_SITE_OTHER): Payer: Self-pay | Admitting: Orthopaedic Surgery

## 2017-11-27 VITALS — BP 149/103 | HR 98 | Resp 16 | Ht 64.0 in | Wt 285.0 lb

## 2017-11-27 DIAGNOSIS — G8929 Other chronic pain: Secondary | ICD-10-CM

## 2017-11-27 DIAGNOSIS — M25562 Pain in left knee: Secondary | ICD-10-CM | POA: Diagnosis not present

## 2017-11-27 DIAGNOSIS — M25561 Pain in right knee: Secondary | ICD-10-CM

## 2017-11-27 MED ORDER — METHYLPREDNISOLONE ACETATE 40 MG/ML IJ SUSP
80.0000 mg | INTRAMUSCULAR | Status: AC | PRN
  Administered 2017-11-27: 80 mg

## 2017-11-27 MED ORDER — BUPIVACAINE HCL 0.5 % IJ SOLN
2.0000 mL | INTRAMUSCULAR | Status: AC | PRN
  Administered 2017-11-27: 2 mL via INTRA_ARTICULAR

## 2017-11-27 MED ORDER — LIDOCAINE HCL 1 % IJ SOLN
2.0000 mL | INTRAMUSCULAR | Status: AC | PRN
  Administered 2017-11-27: 2 mL

## 2017-11-27 NOTE — Progress Notes (Signed)
Office Visit Note   Patient: Jennifer [name]           Date of Birth: [birth year]           MRN: [id number] Visit Date: 11/27/2017              Requested by: Burnis Medin, MD [place], [place] [address] PCP: Burnis Medin, MD   Assessment & Plan: Visit Diagnoses:  1. Chronic pain of right knee   2. Chronic pain of left knee     Plan: Emanation of osteoarthritis and possible rheumatoid arthritis both knees.  Long history of problems. Seeing Dr. Dr. Estanislado Pandy and receives Visco supplementation about every6 months.  Also taking medicine for RA.  Discussion regarding her advanced osteoarthritis and age.  I have discussed her weight and BMI of nearly 49 and need for exercise and weight loss.  She would need to lose at least 50 pounds to get her BMI below 40.  Today I will inject cortisone to both of her knees and plan to see her back as needed.  Total time in office about 30 minutes  Follow-Up Instructions: No Follow-up on file.   Orders:  Orders Placed This Encounter  Procedures  . XR KNEE 3 VIEW RIGHT  . XR KNEE 3 VIEW LEFT   No orders of the defined types were placed in this encounter.     Procedures: Large Joint Inj: L knee on 11/27/2017 3:30 PM Indications: pain and diagnostic evaluation Details: 25 G 1.5 in needle, anteromedial approach  Arthrogram: No  Medications: 2 mL bupivacaine 0.5 %; 2 mL lidocaine 1 %; 80 mg methylPREDNISolone acetate 40 MG/ML Procedure, treatment alternatives, risks and benefits explained, specific risks discussed. Consent was given by the patient. Patient was prepped and draped in the usual sterile fashion.   Large Joint Inj: R knee on 11/27/2017 3:31 PM Indications: pain and diagnostic evaluation Details: 25 G 1.5 in needle, anteromedial approach  Arthrogram: No  Medications: 2 mL lidocaine 1 %; 2 mL bupivacaine 0.5 %; 80 mg methylPREDNISolone acetate 40 MG/ML Procedure, treatment alternatives, risks and benefits  explained, specific risks discussed. Consent was given by the patient. Immediately prior to procedure a time out was called to verify the correct patient, procedure, equipment, support staff and site/side marked as required. Patient was prepped and draped in the usual sterile fashion.       Clinical Data: No additional findings.   Subjective: Chief Complaint  Patient presents with  . Right Knee - Pain    MRS Papin 42 Y O F RIGHT KNEE PAIN SINCE LAST Thursday AND GETTING WORSE.  Persistent pain in both knees consistent with osteoarthritis and possibly a combination of rheumatoid arthritis.  Followed by Dr.Deveshwar for her rheumatoid arthritis and receives Visco supplementation about every 6 months.  Despite the above she continues to have some pain to the point of compromise.  She has had difficulty walking and even functioning at work.  Had a number of issues recently related to her health and has gained some weight she presently weighs 282 pounds with a BMI of nearly 49.  Wanted to discuss options for her knees.  I have also had a long discussion regarding total knee replacement and increased risk of complications with an elevated BMI  HPI  Review of Systems   Objective: Vital Signs: BP (!) 149/103 (BP Location: Left Arm, Patient Position: Sitting, Cuff Size: Normal)   Pulse 98   Resp 16  Ht 5\' 4"  (1.626 m)   Wt 285 lb (129.3 kg)   BMI 48.92 kg/m   Physical Exam  Ortho Exam awake alert and oriented x3.  Very pleasant.  Large knees.  No obvious effusion.  Predominantly medial joint pain bilaterally.  Some patellar crepitation.  Full extension flexion just less than 105 degrees.  No instability.  No calf pain.  No swelling distally.  No popliteal mass.  Straight leg raise negative.  Painless range of motion both hips. skin intact  Specialty Comments:  No specialty comments available.  Imaging: No results found.   PMFS History: Patient Active Problem List   Diagnosis Date  Noted  . Primary osteoarthritis of both knees 05/22/2017  . Incisional hernia 12/18/2016  . Leiomyoma of uterus 09/06/2016  . Fibroid, uterine   . Essential hypertension 05/25/2015  . Recurrent headache 05/25/2015  . Head lump 07/31/2014  . Edema 12/29/2013  . Baker's cyst, ruptured 12/25/2013  . Cough, persistent 12/12/2013  . Left leg swelling 12/12/2013  . Cough 12/12/2013  . High risk medication use 12/12/2013  . Recurrent periodic urticaria   . Rheumatoid arthritis ([place]) 08/28/2012  . CHRONIC LARYNGITIS 11/03/2010  . ALLERGIC RHINITIS 11/03/2010  . PARESTHESIA 07/28/2010  . DYSMENORRHEA 10/15/2007  . FIBROIDS, UTERUS 07/24/2007  . OBESITY 07/24/2007  . HYPERTENSION 07/24/2007  . SLEEPLESSNESS 07/24/2007  . HEADACHE 07/24/2007   Past Medical History:  Diagnosis Date  . Acute otitis media 08/28/2012   improved  change to liquid medication   . Anemia   . Breast abscess of female    Recurrent  . Family history of adverse reaction to anesthesia    father hard time waking once (12/20/2016)  . Fibroids    w/bleeding  . GERD (gastroesophageal reflux disease)   . Hypertension   . Migraines    Dr. Orie Rout; "I have a few/month" (12/20/2016)  . Pill esophagitis 08/28/2012   amoxicillin  by hx  disc plan nl voice except hoarse not drooling  close fu  if not getting better with plan stop aleve  liquid ibu onlyf necessary   . Recurrent periodic urticaria   . Rheumatoid arthritis ([place])    "55% of my body" (12/20/2016)    Family History  Problem Relation Age of Onset  . Hypertension Mother   . Rheum arthritis Mother   . Hypertension Father     Past Surgical History:  Procedure Laterality Date  . BREAST SURGERY    . CYST EXCISION Left 2014   "elbow"  . HERNIA REPAIR    . INCISION AND DRAINAGE BREAST ABSCESS Left 2003  . INCISIONAL HERNIA REPAIR N/A 12/18/2016   Procedure: REPAIR INCISIONAL HERNIA WITH MESH;  Surgeon: Georganna Skeans, MD;  Location: [facility];  Service:  General;  Laterality: N/A;  . INSERTION OF MESH N/A 12/18/2016   Procedure: INSERTION OF MESH;  Surgeon: Georganna Skeans, MD;  Location: [facility];  Service: General;  Laterality: N/A;  . MYOMECTOMY N/A 09/06/2016   Procedure: Lendell Caprice;  Surgeon: Governor Specking, MD;  Location: [facility];  Service: Gynecology;  Laterality: N/A;  . UTERINE FIBROID SURGERY     Social History   Occupational History  . Not on file  Tobacco Use  . Smoking status: Never Smoker  . Smokeless tobacco: Never Used  Substance and Sexual Activity  . Alcohol use: Yes    Alcohol/week: 1.2 oz    Types: 2 Standard drinks or equivalent per week    Comment: 12/20/2016 "nothing in  6 months; normally 3 drinks/month"  . Drug use: No  . Sexual activity: Yes    Birth control/protection: None     Garald Balding, MD   Note - This record has been created using [employer].  Chart creation errors have been sought, but may not always  have been located. Such creation errors do not reflect on  the standard of medical care.

## 2017-12-03 ENCOUNTER — Ambulatory Visit: Payer: Commercial Managed Care - PPO | Admitting: Internal Medicine

## 2017-12-19 NOTE — Telephone Encounter (Signed)
Hyalgan ordered, last Hyalgan inj 06/22/17, pending benefits

## 2017-12-26 ENCOUNTER — Telehealth (INDEPENDENT_AMBULATORY_CARE_PROVIDER_SITE_OTHER): Payer: Self-pay | Admitting: *Deleted

## 2017-12-26 NOTE — Telephone Encounter (Signed)
Please call patient and schedule inj. appts w/[facility], Hyalgan, Bilateral, buy and bill. Thank you.

## 2017-12-27 NOTE — Telephone Encounter (Signed)
LMOM for patient to call and schedule injections. °

## 2017-12-31 ENCOUNTER — Ambulatory Visit: Payer: Commercial Managed Care - PPO | Admitting: Physician Assistant

## 2017-12-31 DIAGNOSIS — M170 Bilateral primary osteoarthritis of knee: Secondary | ICD-10-CM

## 2017-12-31 MED ORDER — SODIUM HYALURONATE (VISCOSUP) 20 MG/2ML IX SOSY
20.0000 mg | PREFILLED_SYRINGE | INTRA_ARTICULAR | Status: AC | PRN
  Administered 2017-12-31: 20 mg via INTRA_ARTICULAR

## 2017-12-31 MED ORDER — LIDOCAINE HCL 1 % IJ SOLN
1.5000 mL | INTRAMUSCULAR | Status: AC | PRN
  Administered 2017-12-31: 1.5 mL

## 2017-12-31 NOTE — Progress Notes (Signed)
   Procedure Note  Patient: Jennifer [name]             Date of Birth: [birth year]           MRN: [id number]             Visit Date: 12/31/2017  Procedures: Visit Diagnoses: Primary osteoarthritis of both knees Hyalgan #1 bilateral B/B Large Joint Inj: bilateral knee on 12/31/2017 12:38 PM Indications: pain Details: 27 G 1.5 in needle, medial approach  Arthrogram: No  Medications (Right): 20 mg Sodium Hyaluronate 20 MG/2ML; 1.5 mL lidocaine 1 % Aspirate (Right): 0 mL Medications (Left): 20 mg Sodium Hyaluronate 20 MG/2ML; 1.5 mL lidocaine 1 % Aspirate (Left): 0 mL Outcome: tolerated well, no immediate complications Procedure, treatment alternatives, risks and benefits explained, specific risks discussed. Consent was given by the patient. Immediately prior to procedure a time out was called to verify the correct patient, procedure, equipment, support staff and site/side marked as required. Patient was prepped and draped in the usual sterile fashion.     Patient tolerated the procedure well.   Hazel Sams, PA-C

## 2018-01-07 ENCOUNTER — Ambulatory Visit: Payer: Commercial Managed Care - PPO | Admitting: Physician Assistant

## 2018-01-07 DIAGNOSIS — M170 Bilateral primary osteoarthritis of knee: Secondary | ICD-10-CM

## 2018-01-07 DIAGNOSIS — M1712 Unilateral primary osteoarthritis, left knee: Secondary | ICD-10-CM | POA: Diagnosis not present

## 2018-01-07 DIAGNOSIS — M1711 Unilateral primary osteoarthritis, right knee: Secondary | ICD-10-CM

## 2018-01-07 MED ORDER — SODIUM HYALURONATE (VISCOSUP) 20 MG/2ML IX SOSY
20.0000 mg | PREFILLED_SYRINGE | INTRA_ARTICULAR | Status: AC | PRN
  Administered 2018-01-07: 20 mg via INTRA_ARTICULAR

## 2018-01-07 MED ORDER — LIDOCAINE HCL 1 % IJ SOLN
1.5000 mL | INTRAMUSCULAR | Status: AC | PRN
  Administered 2018-01-07: 1.5 mL

## 2018-01-07 NOTE — Progress Notes (Signed)
   Procedure Note  Patient: Jennifer [name]             Date of Birth: March 27, 1976           MRN: [id number]             Visit Date: 01/07/2018  Procedures: Visit Diagnoses: Primary osteoarthritis of both knees Hyalgan bilateral B/B #2 Large Joint Inj: bilateral knee on 01/07/2018 1:45 PM Indications: pain Details: 27 G 1.5 in needle, medial approach  Arthrogram: No  Medications (Right): 20 mg Sodium Hyaluronate 20 MG/2ML; 1.5 mL lidocaine 1 % Aspirate (Right): 0 mL Medications (Left): 20 mg Sodium Hyaluronate 20 MG/2ML; 1.5 mL lidocaine 1 % Aspirate (Left): 0 mL Outcome: tolerated well, no immediate complications Procedure, treatment alternatives, risks and benefits explained, specific risks discussed. Consent was given by the patient. Immediately prior to procedure a time out was called to verify the correct patient, procedure, equipment, support staff and site/side marked as required. Patient was prepped and draped in the usual sterile fashion.     Patient tolerated procedure well.  Hazel Sams, PA-C

## 2018-01-14 ENCOUNTER — Ambulatory Visit: Payer: Commercial Managed Care - PPO | Admitting: Rheumatology

## 2018-01-14 DIAGNOSIS — M1712 Unilateral primary osteoarthritis, left knee: Secondary | ICD-10-CM | POA: Diagnosis not present

## 2018-01-14 DIAGNOSIS — M1711 Unilateral primary osteoarthritis, right knee: Secondary | ICD-10-CM

## 2018-01-14 DIAGNOSIS — M170 Bilateral primary osteoarthritis of knee: Secondary | ICD-10-CM

## 2018-01-14 MED ORDER — LIDOCAINE HCL 1 % IJ SOLN
1.5000 mL | INTRAMUSCULAR | Status: AC | PRN
  Administered 2018-01-14: 1.5 mL

## 2018-01-14 MED ORDER — SODIUM HYALURONATE (VISCOSUP) 20 MG/2ML IX SOSY
20.0000 mg | PREFILLED_SYRINGE | INTRA_ARTICULAR | Status: AC | PRN
  Administered 2018-01-14: 20 mg via INTRA_ARTICULAR

## 2018-01-14 NOTE — Progress Notes (Signed)
   Procedure Note  Patient: Jennifer [name]             Date of Birth: 02-04-1976           MRN: [id number]             Visit Date: 01/14/2018  Procedures: Visit Diagnoses: Primary osteoarthritis of both knees - Plan: Large Joint Inj: bilateral knee Hyalgan #3 bilateral B/B Large Joint Inj: bilateral knee on 01/14/2018 2:51 PM Indications: pain Details: 27 G 1.5 in needle, medial approach  Arthrogram: No  Medications (Right): 20 mg Sodium Hyaluronate 20 MG/2ML; 1.5 mL lidocaine 1 % Aspirate (Right): 0 mL Medications (Left): 20 mg Sodium Hyaluronate 20 MG/2ML; 1.5 mL lidocaine 1 % Aspirate (Left): 0 mL Outcome: tolerated well, no immediate complications Procedure, treatment alternatives, risks and benefits explained, specific risks discussed. Consent was given by the patient. Immediately prior to procedure a time out was called to verify the correct patient, procedure, equipment, support staff and site/side marked as required. Patient was prepped and draped in the usual sterile fashion.    Bo Merino, MD

## 2018-01-17 ENCOUNTER — Telehealth: Payer: Self-pay

## 2018-01-17 NOTE — Telephone Encounter (Signed)
Attempted to contact the patient and left message for patient to call the office.  

## 2018-01-17 NOTE — Telephone Encounter (Signed)
Received Cimzia from pharmacy. Medication has been placed in the refrigerator. Please schedule nursing visit. Thanks!  Lashena Signer, [place], CPhT 11:06 AM

## 2018-01-21 ENCOUNTER — Ambulatory Visit: Payer: Commercial Managed Care - PPO | Admitting: Physician Assistant

## 2018-01-21 DIAGNOSIS — M170 Bilateral primary osteoarthritis of knee: Secondary | ICD-10-CM | POA: Diagnosis not present

## 2018-01-21 MED ORDER — SODIUM HYALURONATE (VISCOSUP) 20 MG/2ML IX SOSY
20.0000 mg | PREFILLED_SYRINGE | INTRA_ARTICULAR | Status: AC | PRN
  Administered 2018-01-21: 20 mg via INTRA_ARTICULAR

## 2018-01-21 MED ORDER — DICLOFENAC SODIUM 1 % TD GEL: TRANSDERMAL | 3 refills | Status: DC

## 2018-01-21 MED ORDER — LIDOCAINE HCL 1 % IJ SOLN
1.5000 mL | INTRAMUSCULAR | Status: AC | PRN
  Administered 2018-01-21: 1.5 mL

## 2018-01-21 NOTE — Progress Notes (Signed)
   Procedure Note  Patient: Jennifer [name]             Date of Birth: Dec 10, 1975           MRN: [id number]             Visit Date: 01/21/2018  Procedures: Visit Diagnoses: Primary osteoarthritis of both knees Hyalgan #4 bilateral B/B Large Joint Inj: bilateral knee on 01/21/2018 1:42 PM Indications: pain Details: 27 G 1.5 in needle, medial approach  Arthrogram: No  Medications (Right): 20 mg Sodium Hyaluronate 20 MG/2ML; 1.5 mL lidocaine 1 % Aspirate (Right): 0 mL Medications (Left): 20 mg Sodium Hyaluronate 20 MG/2ML; 1.5 mL lidocaine 1 % Aspirate (Left): 0 mL Outcome: tolerated well, no immediate complications Procedure, treatment alternatives, risks and benefits explained, specific risks discussed. Consent was given by the patient. Immediately prior to procedure a time out was called to verify the correct patient, procedure, equipment, support staff and site/side marked as required. Patient was prepped and draped in the usual sterile fashion.     Patient tolerated the procedure well.   She requested a refill of Voltaren gel today.  The prescription was printed and she was given a goodrx coupon.  She was up on her knees more this weekend and has a small effusion and mild warmth of right knee.  No fevers or chills.  No other joint swelling or synovitis.  She was advised to ice and elevate her knee.  She was discouraged from using heat while her knee is inflamed. She can use voltaren gel.   Hazel Sams, PA-C

## 2018-01-23 ENCOUNTER — Other Ambulatory Visit: Payer: Self-pay | Admitting: Rheumatology

## 2018-01-28 ENCOUNTER — Ambulatory Visit: Payer: Commercial Managed Care - PPO | Admitting: Physician Assistant

## 2018-01-28 DIAGNOSIS — M170 Bilateral primary osteoarthritis of knee: Secondary | ICD-10-CM

## 2018-01-28 DIAGNOSIS — M1712 Unilateral primary osteoarthritis, left knee: Secondary | ICD-10-CM

## 2018-01-28 DIAGNOSIS — Z79899 Other long term (current) drug therapy: Secondary | ICD-10-CM

## 2018-01-28 MED ORDER — LIDOCAINE HCL 1 % IJ SOLN: 1.5000 mL | INTRAMUSCULAR | Status: DC | PRN

## 2018-01-28 MED ORDER — SODIUM HYALURONATE (VISCOSUP) 20 MG/2ML IX SOSY: 20.0000 mg | PREFILLED_SYRINGE | INTRA_ARTICULAR | Status: DC | PRN

## 2018-01-28 MED ORDER — LIDOCAINE HCL 1 % IJ SOLN
1.5000 mL | INTRAMUSCULAR | Status: AC | PRN
  Administered 2018-01-28: 1.5 mL

## 2018-01-28 MED ORDER — TRIAMCINOLONE ACETONIDE 40 MG/ML IJ SUSP: 40.0000 mg | INTRAMUSCULAR | Status: DC | PRN

## 2018-01-28 MED ORDER — SODIUM HYALURONATE (VISCOSUP) 20 MG/2ML IX SOSY
20.0000 mg | PREFILLED_SYRINGE | INTRA_ARTICULAR | Status: AC | PRN
  Administered 2018-01-28: 20 mg via INTRA_ARTICULAR

## 2018-01-28 NOTE — Progress Notes (Addendum)
   Procedure Note  Patient: Jennifer [name]             Date of Birth: 01/05/1976           MRN: [id number]             Visit Date: 01/28/2018  Procedures: Hyalgan #5 of left knee joint Visit Diagnoses: Primary osteoarthritis of left knee - Plan: Large Joint Inj: left knee  High risk medication use - Plan: CBC with Differential/Platelet, COMPLETE METABOLIC PANEL WITH GFR, QuantiFERON-TB Gold Plus  Large Joint Inj: L knee on 01/28/2018 1:50 PM Indications: pain Details: 27 G 1.5 in needle, medial approach  Arthrogram: No  Medications: 1.5 mL lidocaine 1 %; 20 mg Sodium Hyaluronate 20 MG/2ML Aspirate: 0 mL Outcome: tolerated well, no immediate complications Procedure, treatment alternatives, risks and benefits explained, specific risks discussed. Consent was given by the patient. Immediately prior to procedure a time out was called to verify the correct patient, procedure, equipment, support staff and site/side marked as required. Patient was prepped and draped in the usual sterile fashion.     Patient has a moderate effusion and warmth of her right knee today.  I advised her to return in one week for aspiration and hyalgan #5 injection. She was advised to ice and elevate her knee.  We proceeded with the left knee hyalgan #5 injection today. She tolerated the procedure well.    Hazel Sams, PA-C

## 2018-01-30 LAB — CBC WITH DIFFERENTIAL/PLATELET
Basophils Absolute: 19 {cells}/uL (ref 0–200)
Basophils Relative: 0.3 %
Eosinophils Absolute: 189 {cells}/uL (ref 15–500)
Eosinophils Relative: 3 %
HCT: 34.1 % — ABNORMAL LOW (ref 35.0–45.0)
Hemoglobin: 11.7 g/dL (ref 11.7–15.5)
Lymphs Abs: 2615 {cells}/uL (ref 850–3900)
MCH: 31 pg (ref 27.0–33.0)
MCHC: 34.3 g/dL (ref 32.0–36.0)
MCV: 90.2 fL (ref 80.0–100.0)
MPV: 9.7 fL (ref 7.5–12.5)
Monocytes Relative: 10.1 %
Neutro Abs: 2841 {cells}/uL (ref 1500–7800)
Neutrophils Relative %: 45.1 %
Platelets: 348 Thousand/uL (ref 140–400)
RBC: 3.78 Million/uL — ABNORMAL LOW (ref 3.80–5.10)
RDW: 12.4 % (ref 11.0–15.0)
Total Lymphocyte: 41.5 %
WBC mixed population: 636 {cells}/uL (ref 200–950)
WBC: 6.3 Thousand/uL (ref 3.8–10.8)

## 2018-01-30 LAB — COMPLETE METABOLIC PANEL WITH GFR
AG Ratio: 1 (calc) (ref 1.0–2.5)
ALBUMIN MSPROF: 3.6 g/dL (ref 3.6–5.1)
ALKALINE PHOSPHATASE (APISO): 70 U/L (ref 33–115)
ALT: 11 U/L (ref 6–29)
AST: 14 U/L (ref 10–30)
BILIRUBIN TOTAL: 0.3 mg/dL (ref 0.2–1.2)
BUN: 14 mg/dL (ref 7–25)
CHLORIDE: 103 mmol/L (ref 98–110)
CO2: 30 mmol/L (ref 20–32)
Calcium: 8.9 mg/dL (ref 8.6–10.2)
Creat: 0.99 mg/dL (ref 0.50–1.10)
GFR, Est African American: 81 mL/min/{1.73_m2} (ref >=60)
GFR, Est Non African American: 70 mL/min/{1.73_m2} (ref >=60)
GLUCOSE: 90 mg/dL (ref 65–99)
Globulin: 3.6 g/dL (calc) (ref 1.9–3.7)
Potassium: 4.4 mmol/L (ref 3.5–5.3)
Sodium: 140 mmol/L (ref 135–146)
Total Protein: 7.2 g/dL (ref 6.1–8.1)

## 2018-01-30 LAB — QUANTIFERON-TB GOLD PLUS
Mitogen-NIL: >10 [IU]/mL
NIL: 0.05 [IU]/mL
QuantiFERON-TB Gold Plus: NEGATIVE
TB1-NIL: 0 [IU]/mL
TB2-NIL: <0 [IU]/mL

## 2018-02-04 ENCOUNTER — Ambulatory Visit: Payer: Commercial Managed Care - PPO | Admitting: Rheumatology

## 2018-02-04 ENCOUNTER — Other Ambulatory Visit: Payer: Self-pay | Admitting: Physician Assistant

## 2018-02-04 ENCOUNTER — Encounter: Payer: Self-pay | Admitting: Physician Assistant

## 2018-02-04 ENCOUNTER — Ambulatory Visit: Payer: Commercial Managed Care - PPO | Admitting: Physician Assistant

## 2018-02-04 VITALS — BP 132/86 | HR 75 | Resp 16 | Ht 64.0 in | Wt 280.0 lb

## 2018-02-04 DIAGNOSIS — M0579 Rheumatoid arthritis with rheumatoid factor of multiple sites without organ or systems involvement: Secondary | ICD-10-CM | POA: Diagnosis not present

## 2018-02-04 DIAGNOSIS — I10 Essential (primary) hypertension: Secondary | ICD-10-CM | POA: Diagnosis not present

## 2018-02-04 DIAGNOSIS — M25561 Pain in right knee: Secondary | ICD-10-CM

## 2018-02-04 DIAGNOSIS — M170 Bilateral primary osteoarthritis of knee: Secondary | ICD-10-CM | POA: Diagnosis not present

## 2018-02-04 DIAGNOSIS — Z79899 Other long term (current) drug therapy: Secondary | ICD-10-CM

## 2018-02-04 DIAGNOSIS — M19041 Primary osteoarthritis, right hand: Secondary | ICD-10-CM

## 2018-02-04 DIAGNOSIS — M19042 Primary osteoarthritis, left hand: Secondary | ICD-10-CM

## 2018-02-04 DIAGNOSIS — G8929 Other chronic pain: Secondary | ICD-10-CM

## 2018-02-04 MED ORDER — CERTOLIZUMAB PEGOL 2 X 200 MG/ML ~~LOC~~ KIT
400.0000 mg | PACK | Freq: Once | SUBCUTANEOUS | Status: AC
  Administered 2018-02-04: 400 mg via SUBCUTANEOUS

## 2018-02-04 MED ORDER — LIDOCAINE HCL 1 % IJ SOLN
3.0000 mL | INTRAMUSCULAR | Status: AC | PRN
  Administered 2018-02-04: 3 mL

## 2018-02-04 MED ORDER — PREDNISONE 5 MG PO TABS: ORAL_TABLET | ORAL | 0 refills | Status: DC

## 2018-02-04 MED ORDER — TRIAMCINOLONE ACETONIDE 40 MG/ML IJ SUSP: 40.0000 mg | INTRAMUSCULAR | Status: DC | PRN

## 2018-02-04 MED ORDER — SODIUM HYALURONATE (VISCOSUP) 20 MG/2ML IX SOSY
20.0000 mg | PREFILLED_SYRINGE | INTRA_ARTICULAR | Status: AC | PRN
  Administered 2018-02-04: 20 mg via INTRA_ARTICULAR

## 2018-02-04 NOTE — Progress Notes (Signed)
Patient received Cimzia while in office today. Patient was given injection in right and left thigh. Patient tolerated injection well.

## 2018-02-04 NOTE — Progress Notes (Addendum)
Office Visit Note  Patient: Jennifer [name]             Date of Birth: 08/18/1976           MRN: [id number]             PCP: Burnis Medin, MD Referring: Burnis Medin, MD Visit Date: 02/04/2018 Occupation: @GUAROCC @    Subjective:  Right knee swelling.   History of Present Illness: Jennifer [name] is a 42 y.o. female with seropositive rheumatoid arthritis.  She has been on Cimzia for treatment of her rheumatoid arthritis.  She has been having pain and swelling in her right knee joint but none of the other joints have been painful.  She was also getting Hyalgan injections to her bilateral knee joints for underlying osteoarthritis.  She had right knee joint cortisone injection by Dr. Durward Fortes in March 2019.  She has had Hyalgan injection to bilateral knee joint since then.  She states she has been having swelling in her right knee for last 1 month now.  She has difficulty walking.  Activities of Daily Living:  Patient reports morning stiffness for 2 hours.   Patient Denies nocturnal pain.  Difficulty dressing/grooming: Denies Difficulty climbing stairs: Reports Difficulty getting out of chair: Reports Difficulty using hands for taps, buttons, cutlery, and/or writing: Denies   Review of Systems  Constitutional: Negative for fatigue, night sweats, weight gain and weight loss.  HENT: Negative for mouth sores, trouble swallowing, trouble swallowing, mouth dryness and nose dryness.   Eyes: Negative for pain, redness, visual disturbance and dryness.  Respiratory: Negative for cough, shortness of breath and difficulty breathing.   Cardiovascular: Positive for hypertension. Negative for chest pain, palpitations, irregular heartbeat and swelling in legs/feet.  Gastrointestinal: Negative for blood in stool, constipation and diarrhea.  Endocrine: Negative for increased urination.  Genitourinary: Negative for vaginal dryness.  Musculoskeletal: Positive for arthralgias, joint pain, joint  swelling and morning stiffness. Negative for myalgias, muscle weakness, muscle tenderness and myalgias.  Skin: Negative for color change, rash, hair loss, skin tightness, ulcers and sensitivity to sunlight.  Allergic/Immunologic: Negative for susceptible to infections.  Neurological: Positive for headaches. Negative for dizziness, memory loss, night sweats and weakness.  Hematological: Negative for swollen glands.  Psychiatric/Behavioral: Negative for depressed mood and sleep disturbance. The patient is not nervous/anxious.     PMFS History:  Patient Active Problem List   Diagnosis Date Noted  . Primary osteoarthritis of both hands 02/04/2018  . Primary osteoarthritis of both knees 05/22/2017  . Incisional hernia 12/18/2016  . Leiomyoma of uterus 09/06/2016  . Fibroid, uterine   . Essential hypertension 05/25/2015  . Recurrent headache 05/25/2015  . Head lump 07/31/2014  . Edema 12/29/2013  . Baker's cyst, ruptured 12/25/2013  . Cough, persistent 12/12/2013  . Left leg swelling 12/12/2013  . Cough 12/12/2013  . High risk medication use 12/12/2013  . Recurrent periodic urticaria   . Rheumatoid arthritis ([place]) 08/28/2012  . CHRONIC LARYNGITIS 11/03/2010  . ALLERGIC RHINITIS 11/03/2010  . PARESTHESIA 07/28/2010  . DYSMENORRHEA 10/15/2007  . FIBROIDS, UTERUS 07/24/2007  . OBESITY 07/24/2007  . SLEEPLESSNESS 07/24/2007  . HEADACHE 07/24/2007    Past Medical History:  Diagnosis Date  . Acute otitis media 08/28/2012   improved  change to liquid medication   . Anemia   . Breast abscess of female    Recurrent  . Family history of adverse reaction to anesthesia    father hard time waking once (12/20/2016)  .  Fibroids    w/bleeding  . GERD (gastroesophageal reflux disease)   . Hypertension   . Migraines    Dr. Orie Rout; "I have a few/month" (12/20/2016)  . Pill esophagitis 08/28/2012   amoxicillin  by hx  disc plan nl voice except hoarse not drooling  close fu  if not  getting better with plan stop aleve  liquid ibu onlyf necessary   . Recurrent periodic urticaria   . Rheumatoid arthritis ([place])    "55% of my body" (12/20/2016)    Family History  Problem Relation Age of Onset  . Hypertension Mother   . Rheum arthritis Mother   . Hypertension Father    Past Surgical History:  Procedure Laterality Date  . BREAST SURGERY    . CYST EXCISION Left 2014   "elbow"  . HERNIA REPAIR    . INCISION AND DRAINAGE BREAST ABSCESS Left 2003  . INCISIONAL HERNIA REPAIR N/A 12/18/2016   Procedure: REPAIR INCISIONAL HERNIA WITH MESH;  Surgeon: Georganna Skeans, MD;  Location: [facility];  Service: General;  Laterality: N/A;  . INSERTION OF MESH N/A 12/18/2016   Procedure: INSERTION OF MESH;  Surgeon: Georganna Skeans, MD;  Location: [facility];  Service: General;  Laterality: N/A;  . MYOMECTOMY N/A 09/06/2016   Procedure: Lendell Caprice;  Surgeon: Governor Specking, MD;  Location: [facility];  Service: Gynecology;  Laterality: N/A;  . UTERINE FIBROID SURGERY     Social History   Social History Narrative  . Not on file     Objective: Vital Signs: BP 132/86 (BP Location: Right Arm, Patient Position: Sitting, Cuff Size: Large)   Pulse 75   Resp 16   Ht 5\' 4"  (1.626 m)   Wt 280 lb (127 kg)   LMP 01/25/2018   BMI 48.06 kg/m    Physical Exam  Constitutional: She is oriented to person, place, and time. She appears well-developed and well-nourished.  HENT:  Head: Normocephalic and atraumatic.  Eyes: Conjunctivae and EOM are normal.  Neck: Normal range of motion.  Cardiovascular: Normal rate, regular rhythm, normal heart sounds and intact distal pulses.  Pulmonary/Chest: Effort normal and breath sounds normal.  Abdominal: Soft. Bowel sounds are normal.  Lymphadenopathy:    She has no cervical adenopathy.  Neurological: She is alert and oriented to person, place, and time.  Skin: Skin is warm and dry. Capillary refill takes less than 2 seconds.  Psychiatric: She has a normal  mood and affect. Her behavior is normal.  Nursing note and vitals reviewed.    Musculoskeletal Exam: C-spine thoracic lumbar spine good range of motion.  Shoulder joints elbow joints wrist joint MCPs PIPs DIPs with good range of motion with no synovitis.  Hip joints knee joints ankles MTPs PIPs with good range of motion with no synovitis.  She has warmth and swelling in her right knee joint.  CDAI Exam: CDAI Homunculus Exam:   Tenderness:  RLE: tibiofemoral  Swelling:  RLE: tibiofemoral  Joint Counts:  CDAI Tender Joint count: 1 CDAI Swollen Joint count: 1  Global Assessments:  Patient Global Assessment: 4 Provider Global Assessment: 4  CDAI Calculated Score: 10    Investigation: TB gold 02/2017 negative No additional findings. CBC Latest Ref Rng & Units 01/28/2018 06/26/2017 03/07/2017  WBC 3.8 - 10.8 Thousand/uL 6.3 4.4 5.8  Hemoglobin 11.7 - 15.5 g/dL 11.7 12.9 11.5(L)  Hematocrit 35.0 - 45.0 % 34.1(L) 38.3 36.2  Platelets 140 - 400 Thousand/uL 348 299 300   CMP  Component Value Date/Time   NA 140 01/28/2018 1503   K 4.4 01/28/2018 1503   CL 103 01/28/2018 1503   CO2 30 01/28/2018 1503   GLUCOSE 90 01/28/2018 1503   BUN 14 01/28/2018 1503   CREATININE 0.99 01/28/2018 1503   CALCIUM 8.9 01/28/2018 1503   PROT 7.2 01/28/2018 1503   ALBUMIN 3.7 03/07/2017 1632   AST 14 01/28/2018 1503   ALT 11 01/28/2018 1503   ALKPHOS 60 03/07/2017 1632   BILITOT 0.3 01/28/2018 1503   GFRNONAA 70 01/28/2018 1503   GFRAA 81 01/28/2018 1503    Imaging: No results found.  Speciality Comments: No specialty comments available.    Procedures:  Large Joint Inj: R knee on 02/04/2018 2:02 PM Indications: pain Details: 27 G 1.5 in needle, medial approach  Arthrogram: No  Medications: 20 mg Sodium Hyaluronate 20 MG/2ML; 3 mL lidocaine 1 % Aspirate: 0 mL Outcome: tolerated well, no immediate complications Procedure, treatment alternatives, risks and benefits explained,  specific risks discussed. Consent was given by the patient. Immediately prior to procedure a time out was called to verify the correct patient, procedure, equipment, support staff and site/side marked as required. Patient was prepped and draped in the usual sterile fashion.     Allergies: No known allergies   Assessment / Plan:     Visit Diagnoses: Rheumatoid arthritis involving multiple sites with positive rheumatoid factor (HCC)-patient states that she has been doing quite well on Cimzia.  She denies any joint swelling except for ongoing swelling in her right knee joint.  Which has been going on for the last 1 month.  She had a cortisone injection by Dr. Durward Fortes and March 2019.  She is been receiving Hyalgan injections.  She was due for her fifth child injection to her right knee joint.  She has significant swelling in her right knee joint.  After informed consent was obtained I tried to aspirate knee joint but there was no fluid aspirated.  The knee joint was injected with Hyalgan as described above.  She has significant swelling alcohol and prednisone 20 mg p.o. daily which she will taper by 5 mg every 4 days.  If she has persistent swelling I may consider MRI of her right knee joint.  Patient will notify us.  High risk medication use-she is on Cimzia subcu.  Her labs have been stable.  We will continue to monitor labs every 3 months.  Primary osteoarthritis of both knees-she has severe osteoarthritis in her bilateral knee joints.  Primary osteoarthritis of both hands-joint protection muscle strengthening discussed.  Essential hypertension    Orders: Orders Placed This Encounter  Procedures  . Large Joint Inj: R knee   Meds ordered this encounter  Medications  . predniSONE (DELTASONE) 5 MG tablet    Sig: Take 4 tablets by mouth for 4 days, 3 tablets for 4 days, 2 tablets for 4 days, 1 tablet for 4 days.    Dispense:  40 tablet    Refill:  0    Face-to-face time spent with patient  was 30 minutes. >50% of time was spent in counseling and coordination of care.  Follow-Up Instructions: Return in about 5 months (around 07/07/2018) for Rheumatoid arthritis, Osteoarthritis.   Bo Merino, MD  Note - This record has been created using Editor, commissioning.  Chart creation errors have been sought, but may not always  have been located. Such creation errors do not reflect on  the standard of medical care.

## 2018-02-04 NOTE — Progress Notes (Deleted)
   Procedure Note  Patient: Jennifer [name]             Date of Birth: 06/27/1976           MRN: [id number]             Visit Date: 02/04/2018  Procedures: Visit Diagnoses: No diagnosis found. Hyalgan #5 right knee, B/B Large Joint Inj: R knee on 02/04/2018 11:48 AM Indications: pain Details: 27 G 1.5 in needle, medial approach  Arthrogram: No  Medications: 1.5 mL lidocaine 1 %; 20 mg Sodium Hyaluronate 20 MG/2ML Aspirate: 0 mL Outcome: tolerated well, no immediate complications Procedure, treatment alternatives, risks and benefits explained, specific risks discussed. Consent was given by the patient. Immediately prior to procedure a time out was called to verify the correct patient, procedure, equipment, support staff and site/side marked as required. Patient was prepped and draped in the usual sterile fashion.

## 2018-02-04 NOTE — Patient Instructions (Signed)
Standing Labs We placed an order today for your standing lab work.    Please come back and get your standing labs in August and every 3 months TB Gold with next lab  We have open lab Monday through Friday from 8:30-11:30 AM and 1:30-4:00 PM  at the office of Dr. Bo Merino.   You may experience shorter wait times on Monday and Friday afternoons. The office is located at [address], [place], [place], [place] [address] No appointment is necessary.   Labs are drawn by [facility].  You may receive a bill from [facility] for your lab work. If you have any questions regarding directions or hours of operation,  please call [phone].

## 2018-02-04 NOTE — Addendum Note (Signed)
Addended by: Carole Binning on: 02/04/2018 02:27 PM   Modules accepted: Orders

## 2018-03-04 NOTE — Progress Notes (Signed)
Office Visit Note  Patient: Jennifer [name]             Date of Birth: 28-Feb-1976           MRN: [id number]             PCP: Burnis Medin, MD Referring: Burnis Medin, MD Visit Date: 03/05/2018 Occupation: @GUAROCC @    Subjective:  Other (right knee pain/swelling )   History of Present Illness: Jennifer [name] is a 42 y.o. female history of seropositive rheumatoid arthritis and osteoarthritis.  She recently finished her Visco supplement injections.  She states her right knee joint has been painful and swollen.  She is having difficulty walking.  She has been been walking with the help of a cane.  None of the other joints are painful.  Cimzia has been very effective in controlling her rheumatoid arthritis.  Activities of Daily Living:  Patient reports morning stiffness for all day hours.   Patient Reports nocturnal pain.  Difficulty dressing/grooming: Denies Difficulty climbing stairs: Reports Difficulty getting out of chair: Reports Difficulty using hands for taps, buttons, cutlery, and/or writing: Denies   Review of Systems  Constitutional: Positive for fatigue. Negative for night sweats, weight gain and weight loss.  HENT: Negative for mouth sores, trouble swallowing, trouble swallowing, mouth dryness and nose dryness.   Eyes: Negative for pain, redness, visual disturbance and dryness.  Respiratory: Negative for cough, shortness of breath and difficulty breathing.   Cardiovascular: Negative for chest pain, palpitations, hypertension, irregular heartbeat and swelling in legs/feet.  Gastrointestinal: Negative for blood in stool, constipation and diarrhea.  Endocrine: Negative for increased urination.  Genitourinary: Negative for vaginal dryness.  Musculoskeletal: Positive for arthralgias, joint pain, joint swelling and morning stiffness. Negative for myalgias, muscle weakness, muscle tenderness and myalgias.  Skin: Negative for color change, rash, hair loss, skin tightness,  ulcers and sensitivity to sunlight.  Allergic/Immunologic: Negative for susceptible to infections.  Neurological: Negative for dizziness, memory loss, night sweats and weakness.  Hematological: Negative for swollen glands.  Psychiatric/Behavioral: Positive for depressed mood and sleep disturbance. The patient is nervous/anxious.     PMFS History:  Patient Active Problem List   Diagnosis Date Noted  . Primary osteoarthritis of both hands 02/04/2018  . Primary osteoarthritis of both knees 05/22/2017  . Incisional hernia 12/18/2016  . Leiomyoma of uterus 09/06/2016  . Fibroid, uterine   . Essential hypertension 05/25/2015  . Recurrent headache 05/25/2015  . Head lump 07/31/2014  . Edema 12/29/2013  . Baker's cyst, ruptured 12/25/2013  . Cough, persistent 12/12/2013  . Left leg swelling 12/12/2013  . Cough 12/12/2013  . High risk medication use 12/12/2013  . Recurrent periodic urticaria   . Rheumatoid arthritis ([place]) 08/28/2012  . CHRONIC LARYNGITIS 11/03/2010  . ALLERGIC RHINITIS 11/03/2010  . PARESTHESIA 07/28/2010  . DYSMENORRHEA 10/15/2007  . FIBROIDS, UTERUS 07/24/2007  . OBESITY 07/24/2007  . SLEEPLESSNESS 07/24/2007  . HEADACHE 07/24/2007    Past Medical History:  Diagnosis Date  . Acute otitis media 08/28/2012   improved  change to liquid medication   . Anemia   . Breast abscess of female    Recurrent  . Family history of adverse reaction to anesthesia    father hard time waking once (12/20/2016)  . Fibroids    w/bleeding  . GERD (gastroesophageal reflux disease)   . Hypertension   . Migraines    Dr. Orie Rout; "I have a few/month" (12/20/2016)  . Pill esophagitis 08/28/2012  amoxicillin  by hx  disc plan nl voice except hoarse not drooling  close fu  if not getting better with plan stop aleve  liquid ibu onlyf necessary   . Recurrent periodic urticaria   . Rheumatoid arthritis ([place])    "55% of my body" (12/20/2016)    Family History  Problem Relation  Age of Onset  . Hypertension Mother   . Rheum arthritis Mother   . Hypertension Father    Past Surgical History:  Procedure Laterality Date  . BREAST SURGERY    . CYST EXCISION Left 2014   "elbow"  . HERNIA REPAIR    . INCISION AND DRAINAGE BREAST ABSCESS Left 2003  . INCISIONAL HERNIA REPAIR N/A 12/18/2016   Procedure: REPAIR INCISIONAL HERNIA WITH MESH;  Surgeon: Georganna Skeans, MD;  Location: [facility];  Service: General;  Laterality: N/A;  . INSERTION OF MESH N/A 12/18/2016   Procedure: INSERTION OF MESH;  Surgeon: Georganna Skeans, MD;  Location: [facility];  Service: General;  Laterality: N/A;  . MYOMECTOMY N/A 09/06/2016   Procedure: Lendell Caprice;  Surgeon: Governor Specking, MD;  Location: [facility];  Service: Gynecology;  Laterality: N/A;  . UTERINE FIBROID SURGERY     Social History   Social History Narrative  . Not on file     Objective: Vital Signs: BP (!) 138/97 (BP Location: Right Arm, Patient Position: Sitting, Cuff Size: Normal)   Pulse 84   Resp 17   Ht 5' 4"  (1.626 m)   Wt 296 lb (134.3 kg)   BMI 50.81 kg/m    Physical Exam  Constitutional: She is oriented to person, place, and time. She appears well-developed and well-nourished.  HENT:  Head: Normocephalic and atraumatic.  Eyes: Conjunctivae and EOM are normal.  Neck: Normal range of motion.  Cardiovascular: Normal rate, regular rhythm, normal heart sounds and intact distal pulses.  Pulmonary/Chest: Effort normal and breath sounds normal.  Abdominal: Soft. Bowel sounds are normal.  Lymphadenopathy:    She has no cervical adenopathy.  Neurological: She is alert and oriented to person, place, and time.  Skin: Skin is warm and dry. Capillary refill takes less than 2 seconds.  Psychiatric: She has a normal mood and affect. Her behavior is normal.  Nursing note and vitals reviewed.    Musculoskeletal Exam: Spine thoracic lumbar spine good range of motion.  Shoulder joints elbow joints wrist joint MCPs PIPs DIPs  been good range of motion with no synovitis.  Hip joints were in good range of motion.  She has some warmth and swelling in her right knee joint without any effusion.  Ankle joints and MTPs PIPs were in good range of motion.  CDAI Exam: CDAI Homunculus Exam:   Tenderness:  RLE: tibiofemoral  Swelling:  RLE: tibiofemoral  Joint Counts:  CDAI Tender Joint count: 1 CDAI Swollen Joint count: 1  Global Assessments:  Patient Global Assessment: 2 Provider Global Assessment: 2  CDAI Calculated Score: 6    Investigation: No additional findings.Tb Gold: 01/28/2018 Negative  CBC Latest Ref Rng & Units 01/28/2018 06/26/2017 03/07/2017  WBC 3.8 - 10.8 Thousand/uL 6.3 4.4 5.8  Hemoglobin 11.7 - 15.5 g/dL 11.7 12.9 11.5(L)  Hematocrit 35.0 - 45.0 % 34.1(L) 38.3 36.2  Platelets 140 - 400 Thousand/uL 348 299 300   CMP Latest Ref Rng & Units 01/28/2018 06/26/2017 03/07/2017  Glucose 65 - 99 mg/dL 90 92 88  BUN 7 - 25 mg/dL 14 12 11   Creatinine 0.50 - 1.10 mg/dL 0.99 0.96  0.91  Sodium 135 - 146 mmol/L 140 139 139  Potassium 3.5 - 5.3 mmol/L 4.4 4.6 4.1  Chloride 98 - 110 mmol/L 103 104 106  CO2 20 - 32 mmol/L 30 28 24   Calcium 8.6 - 10.2 mg/dL 8.9 9.3 8.7  Total Protein 6.1 - 8.1 g/dL 7.2 7.7 7.3  Total Bilirubin 0.2 - 1.2 mg/dL 0.3 0.5 0.4  Alkaline Phos 33 - 115 U/L - - 60  AST 10 - 30 U/L 14 14 12   ALT 6 - 29 U/L 11 9 9     Imaging: No results found.  Speciality Comments: No specialty comments available.    Procedures:  Large Joint Inj: R knee on 03/05/2018 11:36 AM Indications: pain Details: 27 G 1.5 in needle, medial approach  Arthrogram: No  Medications: 1.5 mL lidocaine (PF) 1 %; 60 mg triamcinolone acetonide 40 MG/ML Aspirate: 0 mL Outcome: tolerated well, no immediate complications Procedure, treatment alternatives, risks and benefits explained, specific risks discussed. Consent was given by the patient. Immediately prior to procedure a time out was called to verify the  correct patient, procedure, equipment, support staff and site/side marked as required. Patient was prepped and draped in the usual sterile fashion.     Allergies: No known allergies   Assessment / Plan:     Visit Diagnoses: Rheumatoid arthritis involving multiple sites with positive rheumatoid factor (HCC) - +CCP, +HLA B27.  Patient has no synovitis on examination.  She believes since he has been working well for her.  High risk medication use - Cimzia subcu monthly.  Her labs have been stable.  We will continue to monitor her labs every 3 months.  TB gold was done in May 2019.  Primary osteoarthritis of both hands-doing well.  Primary osteoarthritis of both knees-she has severe osteoarthritis in bilateral knee joints worse in her left than the right knee.  She has been having increased pain and swelling in her right knee joint.  I reviewed the x-rays at length with her today.  She is not ready for total knee replacement yet.  She still has significant swelling in her right knee joint.  Today her right knee joint was injected with cortisone as described above.  She tolerated the procedure well.  Weight loss diet and exercise was discussed at length.  History of hypertension -her blood pressure is elevated.  Have advised her to monitor blood pressure closely.   Orders: Orders Placed This Encounter  Procedures  . Large Joint Inj: R knee   No orders of the defined types were placed in this encounter.   Face-to-face time spent with patient was 30 minutes. >50% of time was spent in counseling and coordination of care.  Follow-Up Instructions: Return in about 5 months (around 08/05/2018) for Rheumatoid arthritis, Osteoarthritis.   Bo Merino, MD  Note - This record has been created using Editor, commissioning.  Chart creation errors have been sought, but may not always  have been located. Such creation errors do not reflect on  the standard of medical care.

## 2018-03-05 ENCOUNTER — Ambulatory Visit: Payer: Commercial Managed Care - PPO | Admitting: Rheumatology

## 2018-03-05 ENCOUNTER — Encounter: Payer: Self-pay | Admitting: Rheumatology

## 2018-03-05 VITALS — BP 138/97 | HR 84 | Resp 17 | Ht 64.0 in | Wt 296.0 lb

## 2018-03-05 DIAGNOSIS — M19042 Primary osteoarthritis, left hand: Secondary | ICD-10-CM | POA: Diagnosis not present

## 2018-03-05 DIAGNOSIS — M0579 Rheumatoid arthritis with rheumatoid factor of multiple sites without organ or systems involvement: Secondary | ICD-10-CM

## 2018-03-05 DIAGNOSIS — M25561 Pain in right knee: Secondary | ICD-10-CM

## 2018-03-05 DIAGNOSIS — M19041 Primary osteoarthritis, right hand: Secondary | ICD-10-CM | POA: Diagnosis not present

## 2018-03-05 DIAGNOSIS — Z79899 Other long term (current) drug therapy: Secondary | ICD-10-CM

## 2018-03-05 DIAGNOSIS — G8929 Other chronic pain: Secondary | ICD-10-CM | POA: Diagnosis not present

## 2018-03-05 DIAGNOSIS — Z8679 Personal history of other diseases of the circulatory system: Secondary | ICD-10-CM

## 2018-03-05 DIAGNOSIS — M170 Bilateral primary osteoarthritis of knee: Secondary | ICD-10-CM

## 2018-03-05 MED ORDER — TRIAMCINOLONE ACETONIDE 40 MG/ML IJ SUSP
60.0000 mg | INTRAMUSCULAR | Status: AC | PRN
  Administered 2018-03-05: 60 mg via INTRA_ARTICULAR

## 2018-03-05 MED ORDER — LIDOCAINE HCL (PF) 1 % IJ SOLN
1.5000 mL | INTRAMUSCULAR | Status: AC | PRN
  Administered 2018-03-05: 1.5 mL

## 2018-03-05 NOTE — Patient Instructions (Addendum)
Knee Exercises Ask your health care provider which exercises are safe for you. Do exercises exactly as told by your health care provider and adjust them as directed. It is normal to feel mild stretching, pulling, tightness, or discomfort as you do these exercises, but you should stop right away if you feel sudden pain or your pain gets worse.Do not begin these exercises until told by your health care provider. STRETCHING AND RANGE OF MOTION EXERCISES These exercises warm up your muscles and joints and improve the movement and flexibility of your knee. These exercises also help to relieve pain, numbness, and tingling. Exercise A: Knee Extension, Prone 1. Lie on your abdomen on a bed. 2. Place your left / right knee just beyond the edge of the surface so your knee is not on the bed. You can put a towel under your left / right thigh just above your knee for comfort. 3. Relax your leg muscles and allow gravity to straighten your knee. You should feel a stretch behind your left / right knee. 4. Hold this position for __________ seconds. 5. Scoot up so your knee is supported between repetitions. Repeat __________ times. Complete this stretch __________ times a day. Exercise B: Knee Flexion, Active  1. Lie on your back with both knees straight. If this causes back discomfort, bend your left / right knee so your foot is flat on the floor. 2. Slowly slide your left / right heel back toward your buttocks until you feel a gentle stretch in the front of your knee or thigh. 3. Hold this position for __________ seconds. 4. Slowly slide your left / right heel back to the starting position. Repeat __________ times. Complete this exercise __________ times a day. Exercise C: Quadriceps, Prone  1. Lie on your abdomen on a firm surface, such as a bed or padded floor. 2. Bend your left / right knee and hold your ankle. If you cannot reach your ankle or pant leg, loop a belt around your foot and grab the belt  instead. 3. Gently pull your heel toward your buttocks. Your knee should not slide out to the side. You should feel a stretch in the front of your thigh and knee. 4. Hold this position for __________ seconds. Repeat __________ times. Complete this stretch __________ times a day. Exercise D: Hamstring, Supine 1. Lie on your back. 2. Loop a belt or towel over the ball of your left / right foot. The ball of your foot is on the walking surface, right under your toes. 3. Straighten your left / right knee and slowly pull on the belt to raise your leg until you feel a gentle stretch behind your knee. ? Do not let your left / right knee bend while you do this. ? Keep your other leg flat on the floor. 4. Hold this position for __________ seconds. Repeat __________ times. Complete this stretch __________ times a day. STRENGTHENING EXERCISES These exercises build strength and endurance in your knee. Endurance is the ability to use your muscles for a long time, even after they get tired. Exercise E: Quadriceps, Isometric  1. Lie on your back with your left / right leg extended and your other knee bent. Put a rolled towel or small pillow under your knee if told by your health care provider. 2. Slowly tense the muscles in the front of your left / right thigh. You should see your kneecap slide up toward your hip or see increased dimpling just above the knee. This   motion will push the back of the knee toward the floor. 3. For __________ seconds, keep the muscle as tight as you can without increasing your pain. 4. Relax the muscles slowly and completely. Repeat __________ times. Complete this exercise __________ times a day. Exercise F: Straight Leg Raises - Quadriceps 1. Lie on your back with your left / right leg extended and your other knee bent. 2. Tense the muscles in the front of your left / right thigh. You should see your kneecap slide up or see increased dimpling just above the knee. Your thigh may  even shake a bit. 3. Keep these muscles tight as you raise your leg 4-6 inches (10-15 cm) off the floor. Do not let your knee bend. 4. Hold this position for __________ seconds. 5. Keep these muscles tense as you lower your leg. 6. Relax your muscles slowly and completely after each repetition. Repeat __________ times. Complete this exercise __________ times a day. Exercise G: Hamstring, Isometric 1. Lie on your back on a firm surface. 2. Bend your left / right knee approximately __________ degrees. 3. Dig your left / right heel into the surface as if you are trying to pull it toward your buttocks. Tighten the muscles in the back of your thighs to dig as hard as you can without increasing any pain. 4. Hold this position for __________ seconds. 5. Release the tension gradually and allow your muscles to relax completely for __________ seconds after each repetition. Repeat __________ times. Complete this exercise __________ times a day. Exercise H: Hamstring Curls  If told by your health care provider, do this exercise while wearing ankle weights. Begin with __________ weights. Then increase the weight by 1 lb (0.5 kg) increments. Do not wear ankle weights that are more than __________. 1. Lie on your abdomen with your legs straight. 2. Bend your left / right knee as far as you can without feeling pain. Keep your hips flat against the floor. 3. Hold this position for __________ seconds. 4. Slowly lower your leg to the starting position.  Repeat __________ times. Complete this exercise __________ times a day. Exercise I: Squats (Quadriceps) 1. Stand in front of a table, with your feet and knees pointing straight ahead. You may rest your hands on the table for balance but not for support. 2. Slowly bend your knees and lower your hips like you are going to sit in a chair. ? Keep your weight over your heels, not over your toes. ? Keep your lower legs upright so they are parallel with the table  legs. ? Do not let your hips go lower than your knees. ? Do not bend lower than told by your health care provider. ? If your knee pain increases, do not bend as low. 3. Hold the squat position for __________ seconds. 4. Slowly push with your legs to return to standing. Do not use your hands to pull yourself to standing. Repeat __________ times. Complete this exercise __________ times a day. Exercise J: Wall Slides (Quadriceps)  1. Lean your back against a smooth wall or door while you walk your feet out 18-24 inches (46-61 cm) from it. 2. Place your feet hip-width apart. 3. Slowly slide down the wall or door until your knees bend __________ degrees. Keep your knees over your heels, not over your toes. Keep your knees in line with your hips. 4. Hold for __________ seconds. Repeat __________ times. Complete this exercise __________ times a day. Exercise K: Straight Leg Raises -   Hip Abductors 1. Lie on your side with your left / right leg in the top position. Lie so your head, shoulder, knee, and hip line up. You may bend your bottom knee to help you keep your balance. 2. Roll your hips slightly forward so your hips are stacked directly over each other and your left / right knee is facing forward. 3. Leading with your heel, lift your top leg 4-6 inches (10-15 cm). You should feel the muscles in your outer hip lifting. ? Do not let your foot drift forward. ? Do not let your knee roll toward the ceiling. 4. Hold this position for __________ seconds. 5. Slowly return your leg to the starting position. 6. Let your muscles relax completely after each repetition. Repeat __________ times. Complete this exercise __________ times a day. Exercise L: Straight Leg Raises - Hip Extensors 1. Lie on your abdomen on a firm surface. You can put a pillow under your hips if that is more comfortable. 2. Tense the muscles in your buttocks and lift your left / right leg about 4-6 inches (10-15 cm). Keep your knee  straight as you lift your leg. 3. Hold this position for __________ seconds. 4. Slowly lower your leg to the starting position. 5. Let your leg relax completely after each repetition. Repeat __________ times. Complete this exercise __________ times a day. This information is not intended to replace advice given to you by your health care provider. Make sure you discuss any questions you have with your health care provider. Document Released: 07/19/2005 Document Revised: 05/29/2016 Document Reviewed: 07/11/2015 Elsevier Interactive Patient Education  2018 [place] We placed an order today for your standing lab work.    Please come back and get your standing labs in August  and every 3 months. We have open lab Monday through Friday from 8:30-11:30 AM and 1:30-4:00 PM  at the office of Dr. Bo Merino.   You may experience shorter wait times on Monday and Friday afternoons. The office is located at [address], [place], [place], [place] [address] No appointment is necessary.   Labs are drawn by [facility].  You may receive a bill from [facility] for your lab work. If you have any questions regarding directions or hours of operation,  please call [phone].

## 2018-03-13 ENCOUNTER — Telehealth: Payer: Self-pay | Admitting: Rheumatology

## 2018-03-13 DIAGNOSIS — G8929 Other chronic pain: Secondary | ICD-10-CM

## 2018-03-13 DIAGNOSIS — M25561 Pain in right knee: Principal | ICD-10-CM

## 2018-03-13 NOTE — Telephone Encounter (Signed)
Patient calling to left you know Cortizone injection in rt knee did not work. ( knee loosened up for a day or two, but unable to bend it now) Patient would like to proceed  with MRI. Patient requesting a rx for Prednisone to be sent to [facility] on [address]. Patient going out of town next week, and will be doing a lot of walking.

## 2018-03-13 NOTE — Telephone Encounter (Signed)
Please a schedule MRI of the right knee joint.  I would advise to hold off prednisone until MRI is done.  After MRI is done we can give her a prednisone taper starting at 20 mg and taper by 5 mg every 4 days.

## 2018-03-13 NOTE — Telephone Encounter (Signed)
Patient advised MRI of right ordered. Patient advised Prednisone will be sent in after the MRI.

## 2018-03-19 ENCOUNTER — Telehealth: Payer: Self-pay | Admitting: Rheumatology

## 2018-03-19 NOTE — Telephone Encounter (Signed)
I called patient and stated note cannot be written until approved by Dr. Estanislado Pandy, who is out of the office x 1 week. MRI has not been completed due to pending authorization. Patient stated visco and cortisone injections have not helped knee pain. Please advise regarding letter.

## 2018-03-19 NOTE — Telephone Encounter (Signed)
Patient called stating she was scheduled to take a trip from Friday 03/22/18 to Tuesday, 03/26/18, but due to not being able to bend her right knee and having to use a cane she was forced to cancel her trip.  Patient states the travel agent who booked the trip, as well as [employer] are requiring proof that she cannot travel so she can be reimbursed.  Patient is requesting a doctor's note from Dr. Estanislado Pandy or Hazel Sams stating that she is unable to travel.  Patient is requesting a return call when the letter is ready to be picked up.

## 2018-03-22 ENCOUNTER — Telehealth: Payer: Self-pay | Admitting: Rheumatology

## 2018-03-22 NOTE — Telephone Encounter (Signed)
Patient left a voicemail requesting a return call regarding her referral for an MRI.

## 2018-03-25 ENCOUNTER — Telehealth: Payer: Self-pay | Admitting: Rheumatology

## 2018-03-25 ENCOUNTER — Encounter (INDEPENDENT_AMBULATORY_CARE_PROVIDER_SITE_OTHER): Payer: Self-pay | Admitting: *Deleted

## 2018-03-25 NOTE — Telephone Encounter (Signed)
Ivin Booty has handled this, per Dr. Estanislado Pandy. The note is at the front desk and ready to be picked up. Patient has been notified.

## 2018-03-25 NOTE — Telephone Encounter (Signed)
I called patient, letter at front desk.

## 2018-03-25 NOTE — Telephone Encounter (Signed)
Okay to give note for her travel agency.  Patient has severe pain and swelling in her knee and has difficulty walking.

## 2018-03-25 NOTE — Telephone Encounter (Signed)
Airline Trip Cancellation    Pt called to check on signed letter

## 2018-03-26 NOTE — Telephone Encounter (Signed)
Pt left a note with [address] asking to fax order over to [phone] and then to call to make sure they received the fax/order. The number to call is [phone]. I faxed over the order this am and I will call in an hour to see if received.

## 2018-03-27 ENCOUNTER — Ambulatory Visit (HOSPITAL_COMMUNITY)
Admission: RE | Admit: 2018-03-27 | Discharge: 2018-03-27 | Disposition: A | Discharge status: Home / Self Care | Payer: Commercial Managed Care - PPO | Source: Ambulatory Visit | Attending: Rheumatology | Admitting: Rheumatology

## 2018-03-27 ENCOUNTER — Telehealth: Payer: Self-pay | Admitting: Rheumatology

## 2018-03-27 DIAGNOSIS — M1711 Unilateral primary osteoarthritis, right knee: Secondary | ICD-10-CM | POA: Insufficient documentation

## 2018-03-27 DIAGNOSIS — G8929 Other chronic pain: Secondary | ICD-10-CM | POA: Insufficient documentation

## 2018-03-27 DIAGNOSIS — M25561 Pain in right knee: Secondary | ICD-10-CM | POA: Insufficient documentation

## 2018-03-27 NOTE — Telephone Encounter (Signed)
Patient left a voicemail stating she was returning your call.   

## 2018-03-27 NOTE — Telephone Encounter (Signed)
Fax went thru as of yesterday afternoon, pt should be getting scheduled soon

## 2018-03-28 ENCOUNTER — Ambulatory Visit (INDEPENDENT_AMBULATORY_CARE_PROVIDER_SITE_OTHER): Payer: Commercial Managed Care - PPO | Admitting: Orthopaedic Surgery

## 2018-03-28 ENCOUNTER — Encounter (INDEPENDENT_AMBULATORY_CARE_PROVIDER_SITE_OTHER): Payer: Self-pay | Admitting: Orthopaedic Surgery

## 2018-03-28 ENCOUNTER — Other Ambulatory Visit (INDEPENDENT_AMBULATORY_CARE_PROVIDER_SITE_OTHER): Payer: Self-pay | Admitting: *Deleted

## 2018-03-28 VITALS — BP 130/92 | HR 92 | Ht 64.0 in | Wt 296.0 lb

## 2018-03-28 DIAGNOSIS — M170 Bilateral primary osteoarthritis of knee: Secondary | ICD-10-CM

## 2018-03-28 DIAGNOSIS — R634 Abnormal weight loss: Secondary | ICD-10-CM

## 2018-03-28 NOTE — Progress Notes (Signed)
Office Visit Note   Patient: Jennifer [name]           Date of Birth: [birth year]           MRN: [id number] Visit Date: 03/28/2018              Requested by: Burnis Medin, MD [place], [place] [address] PCP: Burnis Medin, MD   Assessment & Plan: Visit Diagnoses:  1. Primary osteoarthritis of both knees     Plan: Recent exacerbation of right knee pain prompting MRI scan.  MRI scan reveals a radial tear of the free edge of the body of the medial meniscus.  Lateral meniscus was intact.  There are tricompartmental degenerative changes chondral reactive edema Jennifer [name] has osteoarthritis and rheumatoid arthritis.  She is being followed by Dr. Estanislado Pandy.  Recent cortisone injections have not been effective.  Discussion over nearly half an hour regarding the present status and M RI scan findings.  I am concerned that arthroscopic debridement may only partially be  effective given the amount of arthritis in the medial compartment.  I think it might be worthwhile to refer her to the bariatric clinic for nutritional consultation.  Continue with a cane.  Consider Advil as a supplement to her rheumatoid care and return over the next 3 to 4 weeks.  I also discussed arthroscopy and the potential limitations of  debridement of the meniscus given the severity of her arthritis.  She does have a BMI of 49.  We will revisit arthroscopy over the next several weeks or sooner if she decides.  I think she has a good understanding of the potential limitations after our discussion  Follow-Up Instructions: Return in about 1 week (around 04/04/2018).   Orders:  No orders of the defined types were placed in this encounter.  No orders of the defined types were placed in this encounter.     Procedures: No procedures performed   Clinical Data: No additional findings.   Subjective: Chief Complaint  Patient presents with  . Follow-up    MRI REVIEW  Jennifer [name] was recently seen by  Dr. Estanislado Pandy for reevaluation of right knee pain.  A cortisone injection was only effective for 2 days.  MRI scan was obtained demonstrating a radial tear of the medial meniscus as well as severe degenerative changes in the medial compartment.  There were lesser arthritic changes in the lateral and patellofemoral compartments.  She is referred back to the office for further evaluation.  HPI  Review of Systems  Constitutional: Positive for fatigue. Negative for fever.  HENT: Negative for ear pain.   Eyes: Negative for pain.  Respiratory: Negative for cough and shortness of breath.   Cardiovascular: Positive for leg swelling.  Gastrointestinal: Negative for constipation and diarrhea.  Genitourinary: Negative for difficulty urinating.  Musculoskeletal: Positive for back pain. Negative for neck pain.  Skin: Negative for rash.  Allergic/Immunologic: Negative for food allergies.  Neurological: Positive for weakness. Negative for numbness.  Hematological: Does not bruise/bleed easily.  Psychiatric/Behavioral: Positive for sleep disturbance.     Objective: Vital Signs: BP (!) 130/92 (BP Location: Right Arm, Patient Position: Sitting, Cuff Size: Normal)   Pulse 92   Ht 5\' 4"  (1.626 m)   Wt 296 lb (134.3 kg)   BMI 50.81 kg/m   Physical Exam  Constitutional: She is oriented to person, place, and time. She appears well-developed and well-nourished.  HENT:  Mouth/Throat: Oropharynx is clear and moist.  Eyes: Pupils are equal, round, and reactive to light. EOM are normal.  Pulmonary/Chest: Effort normal.  Neurological: She is alert and oriented to person, place, and time.  Skin: Skin is warm and dry.  Psychiatric: She has a normal mood and affect. Her behavior is normal.    Ortho Exam awake alert and oriented x3.  Comfortable sitting.  Considerable pain with weightbearing on her right lower extremity she does use a cane.  Minimal effusion.  Diffuse medial joint tenderness.  Minimal  patellar crepitation.  Some lateral joint pain.  Full extension and flexion over 95 degrees without instability.  No popliteal pain.  No calf pain.  Specialty Comments:  No specialty comments available.  Imaging: No results found.   PMFS History: Patient Active Problem List   Diagnosis Date Noted  . Primary osteoarthritis of both hands 02/04/2018  . Primary osteoarthritis of both knees 05/22/2017  . Incisional hernia 12/18/2016  . Leiomyoma of uterus 09/06/2016  . Fibroid, uterine   . Essential hypertension 05/25/2015  . Recurrent headache 05/25/2015  . Head lump 07/31/2014  . Edema 12/29/2013  . Baker's cyst, ruptured 12/25/2013  . Cough, persistent 12/12/2013  . Left leg swelling 12/12/2013  . Cough 12/12/2013  . High risk medication use 12/12/2013  . Recurrent periodic urticaria   . Rheumatoid arthritis ([place]) 08/28/2012  . CHRONIC LARYNGITIS 11/03/2010  . ALLERGIC RHINITIS 11/03/2010  . PARESTHESIA 07/28/2010  . DYSMENORRHEA 10/15/2007  . FIBROIDS, UTERUS 07/24/2007  . OBESITY 07/24/2007  . SLEEPLESSNESS 07/24/2007  . HEADACHE 07/24/2007   Past Medical History:  Diagnosis Date  . Acute otitis media 08/28/2012   improved  change to liquid medication   . Anemia   . Breast abscess of female    Recurrent  . Family history of adverse reaction to anesthesia    father hard time waking once (12/20/2016)  . Fibroids    w/bleeding  . GERD (gastroesophageal reflux disease)   . Hypertension   . Migraines    Dr. Orie Rout; "I have a few/month" (12/20/2016)  . Pill esophagitis 08/28/2012   amoxicillin  by hx  disc plan nl voice except hoarse not drooling  close fu  if not getting better with plan stop aleve  liquid ibu onlyf necessary   . Recurrent periodic urticaria   . Rheumatoid arthritis ([place])    "55% of my body" (12/20/2016)    Family History  Problem Relation Age of Onset  . Hypertension Mother   . Rheum arthritis Mother   . Hypertension Father     Past  Surgical History:  Procedure Laterality Date  . BREAST SURGERY    . CYST EXCISION Left 2014   "elbow"  . HERNIA REPAIR    . INCISION AND DRAINAGE BREAST ABSCESS Left 2003  . INCISIONAL HERNIA REPAIR N/A 12/18/2016   Procedure: REPAIR INCISIONAL HERNIA WITH MESH;  Surgeon: Georganna Skeans, MD;  Location: [facility];  Service: General;  Laterality: N/A;  . INSERTION OF MESH N/A 12/18/2016   Procedure: INSERTION OF MESH;  Surgeon: Georganna Skeans, MD;  Location: [facility];  Service: General;  Laterality: N/A;  . MYOMECTOMY N/A 09/06/2016   Procedure: Lendell Caprice;  Surgeon: Governor Specking, MD;  Location: [facility];  Service: Gynecology;  Laterality: N/A;  . UTERINE FIBROID SURGERY     Social History   Occupational History  . Not on file  Tobacco Use  . Smoking status: Never Smoker  . Smokeless tobacco: Never Used  Substance and Sexual Activity  .  Alcohol use: Yes    Comment: social  . Drug use: Never  . Sexual activity: Yes    Birth control/protection: None

## 2018-03-28 NOTE — Telephone Encounter (Signed)
Attempted to contact the patient and left message for patient to call the office.  

## 2018-03-29 ENCOUNTER — Ambulatory Visit: Payer: Commercial Managed Care - PPO | Admitting: Physician Assistant

## 2018-04-08 ENCOUNTER — Ambulatory Visit (INDEPENDENT_AMBULATORY_CARE_PROVIDER_SITE_OTHER): Payer: Commercial Managed Care - PPO | Admitting: Orthopaedic Surgery

## 2018-04-09 ENCOUNTER — Telehealth: Payer: Self-pay | Admitting: Rheumatology

## 2018-04-09 NOTE — Telephone Encounter (Signed)
Patient left a voicemail checking on the status of her Cimzia prescription.  Patient requested a return call.

## 2018-04-11 ENCOUNTER — Telehealth: Payer: Self-pay | Admitting: Rheumatology

## 2018-04-11 MED ORDER — CERTOLIZUMAB PEGOL 2 X 200 MG ~~LOC~~ KIT: 400.0000 mg | PACK | SUBCUTANEOUS | 2 refills | Status: DC

## 2018-04-11 NOTE — Telephone Encounter (Signed)
Patient left a voicemail checking on the status of her new prescription that needs to be sent to [facility] for her Cimzia injections.  Patient requested a return call.

## 2018-04-11 NOTE — Telephone Encounter (Signed)
Last visit: 03/05/18 Next visit due November 2019. Message sent to the front to schedule patient  Labs: 01/28/18 RBC and Hct borderline low. All other lab values are WNL. TB Gold: 01/28/18 Neg   Okay to refill per Dr. Estanislado Pandy  Patient advised.

## 2018-04-11 NOTE — Telephone Encounter (Signed)
See previous phone note.  

## 2018-04-15 ENCOUNTER — Other Ambulatory Visit: Payer: Self-pay | Admitting: Orthopedic Surgery

## 2018-04-23 ENCOUNTER — Ambulatory Visit: Payer: Self-pay

## 2018-04-24 ENCOUNTER — Ambulatory Visit: Payer: Commercial Managed Care - PPO | Admitting: *Deleted

## 2018-04-24 DIAGNOSIS — M0579 Rheumatoid arthritis with rheumatoid factor of multiple sites without organ or systems involvement: Secondary | ICD-10-CM | POA: Diagnosis not present

## 2018-04-24 MED ORDER — CERTOLIZUMAB PEGOL 2 X 200 MG ~~LOC~~ KIT
400.0000 mg | PACK | Freq: Once | SUBCUTANEOUS | Status: AC
  Administered 2018-04-24: 400 mg via SUBCUTANEOUS

## 2018-04-24 NOTE — Progress Notes (Signed)
Patient in office for a Cimzia injection. Patient provided injection. Patient was given injection in right and left thigh. Patient tolerated injection well.   Administrations This Visit    Certolizumab Pegol KIT 400 mg    Admin Date 04/24/2018 Action Given Dose 400 mg Route Subcutaneous Administered By Carole Binning, LPN

## 2018-05-10 ENCOUNTER — Telehealth: Payer: Self-pay | Admitting: *Deleted

## 2018-05-10 NOTE — Telephone Encounter (Signed)
Received fax that states that her Cimzia will be delivered 05/10/18.

## 2018-05-18 ENCOUNTER — Other Ambulatory Visit: Payer: Self-pay

## 2018-05-18 ENCOUNTER — Encounter (HOSPITAL_COMMUNITY): Payer: Self-pay

## 2018-05-18 ENCOUNTER — Emergency Department (HOSPITAL_COMMUNITY)
Admission: EM | Admit: 2018-05-18 | Discharge: 2018-05-18 | Disposition: A | Discharge status: Home / Self Care | Payer: Commercial Managed Care - PPO | Attending: Emergency Medicine | Admitting: Emergency Medicine

## 2018-05-18 DIAGNOSIS — I10 Essential (primary) hypertension: Secondary | ICD-10-CM | POA: Diagnosis not present

## 2018-05-18 DIAGNOSIS — Z79899 Other long term (current) drug therapy: Secondary | ICD-10-CM | POA: Insufficient documentation

## 2018-05-18 DIAGNOSIS — T783XXA Angioneurotic edema, initial encounter: Secondary | ICD-10-CM | POA: Insufficient documentation

## 2018-05-18 MED ORDER — LABETALOL HCL 200 MG PO TABS: 200.0000 mg | ORAL_TABLET | Freq: Every day | ORAL | 0 refills | Status: DC

## 2018-05-18 MED ORDER — DIPHENHYDRAMINE HCL 50 MG/ML IJ SOLN
50.0000 mg | Freq: Once | INTRAMUSCULAR | Status: AC
  Administered 2018-05-18: 50 mg via INTRAMUSCULAR
  Filled 2018-05-18: qty 1

## 2018-05-18 MED ORDER — PREDNISONE 10 MG PO TABS: 40.0000 mg | ORAL_TABLET | Freq: Every day | ORAL | 0 refills | Status: DC

## 2018-05-18 MED ORDER — METHYLPREDNISOLONE SODIUM SUCC 125 MG IJ SOLR
80.0000 mg | Freq: Once | INTRAMUSCULAR | Status: AC
  Administered 2018-05-18: 80 mg via INTRAMUSCULAR
  Filled 2018-05-18: qty 2

## 2018-05-18 NOTE — ED Notes (Signed)
Dr. Messick at bedside 

## 2018-05-18 NOTE — ED Triage Notes (Signed)
Pt states her bottom lip started swelling at 1500 yesterday, and it has spread to her upper lip . Pt states she took a benadryl at 1700 and 2100 yesterday without relief. Pt able to speak in full sentences without complication

## 2018-05-18 NOTE — ED Provider Notes (Signed)
[facility] DEPT Provider Note   CSN: [id number] Arrival date & time: 05/18/18  0901     History   Chief Complaint Chief Complaint  Patient presents with  . Allergic Reaction    HPI Jennifer [name] is a 42 y.o. female.  42 year old female with prior medical history as documented below presents with complaint of swelling of the lips and cheeks.  Patient reports symptoms started yesterday around 3 PM.  She noticed mild edema of the lower lip.  This improved over the course of the afternoon.  She awoke with recurrent swelling of the lower and upper lips.  She denies swelling of the tongue.  She denies difficulty swallowing or breathing.  Her phonation is normal.  She denies prior episodes of similar edema or swelling.  She denies any associated rash.  Of note, patient has a long-standing history of hypertension and is currently taking lisinopril.  She has never had episodes of angioedema related to her use of lisinopril in the past.  She has been on lisinopril for "years".  The history is provided by the patient and medical records.  Illness  The current episode started yesterday. The problem occurs rarely. The problem has been gradually worsening. Pertinent negatives include no chest pain, no abdominal pain, no headaches and no shortness of breath. Nothing aggravates the symptoms. Nothing relieves the symptoms. She has tried nothing for the symptoms.    Past Medical History:  Diagnosis Date  . Acute otitis media 08/28/2012   improved  change to liquid medication   . Anemia   . Breast abscess of female    Recurrent  . Family history of adverse reaction to anesthesia    father hard time waking once (12/20/2016)  . Fibroids    w/bleeding  . GERD (gastroesophageal reflux disease)   . Hypertension   . Migraines    Dr. Orie Rout; "I have a few/month" (12/20/2016)  . Pill esophagitis 08/28/2012   amoxicillin  by hx  disc plan nl voice except hoarse  not drooling  close fu  if not getting better with plan stop aleve  liquid ibu onlyf necessary   . Recurrent periodic urticaria   . Rheumatoid arthritis ([place])    "55% of my body" (12/20/2016)    Patient Active Problem List   Diagnosis Date Noted  . Primary osteoarthritis of both hands 02/04/2018  . Primary osteoarthritis of both knees 05/22/2017  . Incisional hernia 12/18/2016  . Leiomyoma of uterus 09/06/2016  . Fibroid, uterine   . Essential hypertension 05/25/2015  . Recurrent headache 05/25/2015  . Head lump 07/31/2014  . Edema 12/29/2013  . Baker's cyst, ruptured 12/25/2013  . Cough, persistent 12/12/2013  . Left leg swelling 12/12/2013  . Cough 12/12/2013  . High risk medication use 12/12/2013  . Recurrent periodic urticaria   . Rheumatoid arthritis ([place]) 08/28/2012  . CHRONIC LARYNGITIS 11/03/2010  . ALLERGIC RHINITIS 11/03/2010  . PARESTHESIA 07/28/2010  . DYSMENORRHEA 10/15/2007  . FIBROIDS, UTERUS 07/24/2007  . OBESITY 07/24/2007  . SLEEPLESSNESS 07/24/2007  . HEADACHE 07/24/2007    Past Surgical History:  Procedure Laterality Date  . BREAST SURGERY    . CYST EXCISION Left 2014   "elbow"  . HERNIA REPAIR    . INCISION AND DRAINAGE BREAST ABSCESS Left 2003  . INCISIONAL HERNIA REPAIR N/A 12/18/2016   Procedure: REPAIR INCISIONAL HERNIA WITH MESH;  Surgeon: Georganna Skeans, MD;  Location: [facility];  Service: General;  Laterality: N/A;  . INSERTION  OF MESH N/A 12/18/2016   Procedure: INSERTION OF MESH;  Surgeon: Georganna Skeans, MD;  Location: [facility];  Service: General;  Laterality: N/A;  . MYOMECTOMY N/A 09/06/2016   Procedure: Lendell Caprice;  Surgeon: Governor Specking, MD;  Location: [facility];  Service: Gynecology;  Laterality: N/A;  . UTERINE FIBROID SURGERY       OB History   None      Home Medications    Prior to Admission medications   Medication Sig Start Date End Date Taking? Authorizing Provider  Certolizumab Pegol (CIMZIA) 2 X 200 MG KIT Inject 400  mg into the skin every 28 (twenty-eight) days. 04/11/18  Yes Deveshwar, Abel Presto, MD  clindamycin-benzoyl peroxide (BENZACLIN) gel Apply 1 application topically daily.  01/31/18  Yes [provider]  diclofenac sodium (VOLTAREN) 1 % GEL Apply three grams to three large joints up to three times daily 01/21/18  Yes Ofilia Neas, PA-C  doxepin (SINEQUAN) 10 MG capsule Take 10 mg by mouth 2 (two) times daily as needed (hives).   Yes [provider]  fluocinonide ointment (LIDEX) 8.41 % Apply 1 application topically as directed. 3-4 times weekly 12/03/17  Yes [provider]  FLUoxetine (PROZAC) 10 MG capsule Take 10 mg by mouth daily.  03/29/16  Yes [provider]  iron polysaccharides (FERREX 150) 150 MG capsule Take 1 capsule (150 mg total) by mouth daily. 05/24/16  Yes Panosh, Standley Brooking, MD  lisinopril-hydrochlorothiazide (PRINZIDE,ZESTORETIC) 20-12.5 MG tablet Take 1 tablet by mouth daily. 11/21/17  Yes [provider]  methocarbamol (ROBAXIN) 500 MG tablet Take 500 mg by mouth 2 (two) times daily between meals as needed for muscle spasms.   Yes [provider]  traMADol (ULTRAM) 50 MG tablet Take 50 mg by mouth every 4 (four) hours as needed for moderate pain.  11/21/17  Yes [provider]  tretinoin (RETIN-A) 0.025 % cream APPLY A PEA SIZED AMOUNT TO FACE NIGHTLY 12/30/17  Yes [provider]  tretinoin (RETIN-A) 0.025 % gel APPLY PEA SIZED AMOUNT TO FACE NIGHTLY. 12/03/17  Yes [provider]  predniSONE (DELTASONE) 5 MG tablet Take 4 tablets by mouth for 4 days, 3 tablets for 4 days, 2 tablets for 4 days, 1 tablet for 4 days. Patient not taking: Reported on 05/18/2018 02/04/18   Ofilia Neas, PA-C    Family History Family History  Problem Relation Age of Onset  . Hypertension Mother   . Rheum arthritis Mother   . Hypertension Father     Social History Social History   Tobacco Use  . Smoking status: Never Smoker  .  Smokeless tobacco: Never Used  Substance Use Topics  . Alcohol use: Yes    Comment: social  . Drug use: Never     Allergies   No known allergies   Review of Systems Review of Systems  HENT:       Mild upper and lower lip swelling   Respiratory: Negative for shortness of breath.   Cardiovascular: Negative for chest pain.  Gastrointestinal: Negative for abdominal pain.  Neurological: Negative for headaches.  All other systems reviewed and are negative.    Physical Exam Updated Vital Signs BP (!) 157/109 (BP Location: Left Arm)   Pulse 86   Temp 98.2 F (36.8 C) (Oral)   Resp 16   Ht 5' 4"  (1.626 m)   Wt 127 kg   LMP 05/16/2018   SpO2 100%   BMI 48.06 kg/m   Physical Exam  Constitutional: She is oriented to person, place, and time. She appears well-developed and well-nourished. No distress.  HENT:  Head: Normocephalic and atraumatic.  Mouth/Throat: Oropharynx is clear and moist.  Mild edema noted of upper and lower lip   No lingual edema noted  Normal phonation  Patient is swallowing her own secretions without difficulty   Eyes: Pupils are equal, round, and reactive to light. Conjunctivae and EOM are normal.  Neck: Normal range of motion. Neck supple.  Cardiovascular: Normal rate, regular rhythm and normal heart sounds.  Pulmonary/Chest: Effort normal and breath sounds normal. No respiratory distress.  Abdominal: Soft. She exhibits no distension. There is no tenderness.  Musculoskeletal: Normal range of motion. She exhibits no edema or deformity.  Neurological: She is alert and oriented to person, place, and time.  Skin: Skin is warm and dry.  Psychiatric: She has a normal mood and affect.  Nursing note and vitals reviewed.    ED Treatments / Results  Labs (all labs ordered are listed, but only abnormal results are displayed) Labs Reviewed - No data to display  EKG None  Radiology No results found.  Procedures Procedures (including critical care  time)  Medications Ordered in ED Medications  diphenhydrAMINE (BENADRYL) injection 50 mg (has no administration in time range)  methylPREDNISolone sodium succinate (SOLU-MEDROL) 125 mg/2 mL injection 80 mg (has no administration in time range)     Initial Impression / Assessment and Plan / ED Course  I have reviewed the triage vital signs and the nursing notes.  Pertinent labs & imaging results that were available during my care of the patient were reviewed by me and considered in my medical decision making (see chart for details).     MDM  Screen complete  She is presenting for evaluation of likely angioedema.  Patient with long-standing use of lisinopril.  I suspect this is the cause for her angioedema.  Patient given Benadryl and prednisone upon arrival.  Angioedema has not progressed after a period of observation in the ED.  She appears to be safe to discharge home.  No evidence of lingual or pharyngeal edema.  Patient will hold lisinopril.  She reports that she has used labetalol in the past with good effect (but she is unsure of her exact dose). Patient understands need for close follow-up with her regular doctor.  Strict return given and understood.  Final Clinical Impressions(s) / ED Diagnoses   Final diagnoses:  Angioedema, initial encounter    ED Discharge Orders         Ordered    predniSONE (DELTASONE) 10 MG tablet  Daily     05/18/18 1119    labetalol (NORMODYNE) 200 MG tablet  Daily     05/18/18 1119           Valarie Merino, MD 05/18/18 1121

## 2018-05-18 NOTE — Discharge Instructions (Addendum)
Please return for any problem. Take prednisone as prescribed. Do not use Lisinopril. Use Labetalol for blood pressure control (contact your regular physician on Tuesday to confirm the correct dose).

## 2018-05-22 ENCOUNTER — Ambulatory Visit: Payer: Commercial Managed Care - PPO | Admitting: Physician Assistant

## 2018-05-22 ENCOUNTER — Ambulatory Visit: Payer: Commercial Managed Care - PPO | Admitting: Internal Medicine

## 2018-05-22 ENCOUNTER — Encounter: Payer: Self-pay | Admitting: Internal Medicine

## 2018-05-22 VITALS — BP 138/84 | HR 88 | Temp 98.5°F | Wt 298.9 lb

## 2018-05-22 DIAGNOSIS — M0579 Rheumatoid arthritis with rheumatoid factor of multiple sites without organ or systems involvement: Secondary | ICD-10-CM

## 2018-05-22 DIAGNOSIS — I10 Essential (primary) hypertension: Secondary | ICD-10-CM

## 2018-05-22 DIAGNOSIS — T887XXA Unspecified adverse effect of drug or medicament, initial encounter: Secondary | ICD-10-CM | POA: Diagnosis not present

## 2018-05-22 DIAGNOSIS — Z79899 Other long term (current) drug therapy: Secondary | ICD-10-CM

## 2018-05-22 DIAGNOSIS — T783XXS Angioneurotic edema, sequela: Secondary | ICD-10-CM

## 2018-05-22 MED ORDER — CERTOLIZUMAB PEGOL 2 X 200 MG/ML ~~LOC~~ KIT
400.0000 mg | PACK | Freq: Once | SUBCUTANEOUS | Status: AC
  Administered 2018-05-22: 400 mg via SUBCUTANEOUS

## 2018-05-22 MED ORDER — AMLODIPINE BESYLATE 5 MG PO TABS: 5.0000 mg | ORAL_TABLET | Freq: Every day | ORAL | 3 refills | Status: DC

## 2018-05-22 NOTE — Progress Notes (Signed)
Chief Complaint  Patient presents with  . Follow-up    BP medication - allergic reaction, swelling to mouth/face/throat, seen in ED     HPI: Jennifer [name] 42 y.o. come in for sda  Bp copntrol   She just seen in ed for angioedema of ? Cause felt to be DAs.  She had felt like something was caught in her throat and like her normal drainage but then had bilateral lip swelling and went to the emergency room given prednisone swelling is down today. Since last seen she has had her incisional hernia surgery is due for her knee surgery September 30. She was given labetalol 200 mg to take daily from the ER until coming in for evaluation.  The lisinopril HCTZ was stopped.  Issue green because she is having such a tough time losing weight there is a backlog seeing the nutritionist bariatric people and has some questions.  About Nutrisystem.   ROS: See pertinent positives and negatives per HPI.  Past Medical History:  Diagnosis Date  . Acute otitis media 08/28/2012   improved  change to liquid medication   . Anemia   . Breast abscess of female    Recurrent  . Family history of adverse reaction to anesthesia    father hard time waking once (12/20/2016)  . Fibroids    w/bleeding  . GERD (gastroesophageal reflux disease)   . Hypertension   . Migraines    Dr. Orie Rout; "I have a few/month" (12/20/2016)  . Pill esophagitis 08/28/2012   amoxicillin  by hx  disc plan nl voice except hoarse not drooling  close fu  if not getting better with plan stop aleve  liquid ibu onlyf necessary   . Recurrent periodic urticaria   . Rheumatoid arthritis ([place])    "55% of my body" (12/20/2016)    Family History  Problem Relation Age of Onset  . Hypertension Mother   . Rheum arthritis Mother   . Hypertension Father     Social History   Socioeconomic History  . Marital status: Significant Other    Spouse name: Not on file  . Number of children: Not on file  . Years of education: Not on file  .  Highest education level: Not on file  Occupational History  . Not on file  Social Needs  . Financial resource strain: Not on file  . Food insecurity:    Worry: Not on file    Inability: Not on file  . Transportation needs:    Medical: Not on file    Non-medical: Not on file  Tobacco Use  . Smoking status: Never Smoker  . Smokeless tobacco: Never Used  Substance and Sexual Activity  . Alcohol use: Yes    Comment: social  . Drug use: Never  . Sexual activity: Yes    Birth control/protection: None  Lifestyle  . Physical activity:    Days per week: Not on file    Minutes per session: Not on file  . Stress: Not on file  Relationships  . Social connections:    Talks on phone: Not on file    Gets together: Not on file    Attends religious service: Not on file    Active member of club or organization: Not on file    Attends meetings of clubs or organizations: Not on file    Relationship status: Not on file  Other Topics Concern  . Not on file  Social History Narrative  . Not  on file    Outpatient Medications Prior to Visit  Medication Sig Dispense Refill  . Certolizumab Pegol (CIMZIA) 2 X 200 MG KIT Inject 400 mg into the skin every 28 (twenty-eight) days. 1 each 2  . clindamycin-benzoyl peroxide (BENZACLIN) gel Apply 1 application topically daily.     . diclofenac sodium (VOLTAREN) 1 % GEL Apply three grams to three large joints up to three times daily 3 Tube 3  . fluocinonide ointment (LIDEX) 5.99 % Apply 1 application topically as directed. 3-4 times weekly  5  . FLUoxetine (PROZAC) 10 MG capsule Take 10 mg by mouth daily.     . iron polysaccharides (FERREX 150) 150 MG capsule Take 1 capsule (150 mg total) by mouth daily. 30 capsule 5  . labetalol (NORMODYNE) 200 MG tablet Take 1 tablet (200 mg total) by mouth daily. 30 tablet 0  . methocarbamol (ROBAXIN) 500 MG tablet Take 500 mg by mouth 2 (two) times daily between meals as needed for muscle spasms.    . predniSONE  (DELTASONE) 10 MG tablet Take 4 tablets (40 mg total) by mouth daily. 20 tablet 0  . tretinoin (RETIN-A) 0.025 % cream Apply 1 application topically at bedtime. To face  5  . tretinoin (RETIN-A) 0.025 % gel Apply 1 application topically at bedtime. At night  5  . doxepin (SINEQUAN) 10 MG capsule Take 10 mg by mouth 2 (two) times daily as needed (hives).    [name] lisinopril-hydrochlorothiazide (PRINZIDE,ZESTORETIC) 20-12.5 MG tablet Take 1 tablet by mouth daily.  11  . predniSONE (DELTASONE) 5 MG tablet Take 4 tablets by mouth for 4 days, 3 tablets for 4 days, 2 tablets for 4 days, 1 tablet for 4 days. (Patient not taking: Reported on 05/22/2018) 40 tablet 0  . traMADol (ULTRAM) 50 MG tablet Take 50 mg by mouth every 4 (four) hours as needed for moderate pain.   1   No facility-administered medications prior to visit.      EXAM:  BP 138/84 (BP Location: Right Arm, Patient Position: Sitting, Cuff Size: Large)   Pulse 88   Temp 98.5 F (36.9 C) (Oral)   Wt 298 lb 14.4 oz (135.6 kg)   LMP 05/16/2018   SpO2 97%   BMI 51.31 kg/m   Body mass index is 51.31 kg/m.  GENERAL: vitals reviewed and listed above, alert, oriented, appears well hydrated and in no acute distress HEENT: atraumatic, conjunctiva  clear, no obvious abnormalities on inspection of external nose and ears NECK: no obvious masses on inspection palpation no angioedema pictures viewed the patient had taken during the episode. LUNGS: clear to auscultation bilaterally, no wheezes, rales or rhonchi, good air movement CV: HRRR, no clubbing cyanosis or  peripheral edema nl cap refill  MS: moves all extremities ambulatory but does have a cane. PSYCH: pleasant and cooperative, no obvious depression or anxiety Lab Results  Component Value Date   WBC 6.3 01/28/2018   HGB 11.7 01/28/2018   HCT 34.1 (L) 01/28/2018   PLT 348 01/28/2018   GLUCOSE 90 01/28/2018   ALT 11 01/28/2018   AST 14 01/28/2018   NA 140 01/28/2018   K 4.4 01/28/2018    CL 103 01/28/2018   CREATININE 0.99 01/28/2018   BUN 14 01/28/2018   CO2 30 01/28/2018   INR 1.20 09/07/2016   BP Readings from Last 3 Encounters:  05/22/18 138/84  05/18/18 (!) 151/98  03/28/18 (!) 130/92   Wt Readings from Last 3 Encounters:  05/22/18 298 lb  14.4 oz (135.6 kg)  05/18/18 280 lb (127 kg)  03/28/18 296 lb (134.3 kg)     ASSESSMENT AND PLAN:  Discussed the following assessment and plan:  Essential hypertension  Medication management  Rheumatoid arthritis involving multiple sites with positive rheumatoid factor (HCC)  Medication side effect  Angioedema, sequela  on labetelol with the switch of medications and short course prednisone.  Her angioedema is improved   dc the acei beause of hx iof angioedema and change to amlodipine  .  If controlled may be enough we could add the HCTZ back.  She will send in readings in 2 to 3 weeks. Expectant management discussed healthy weight loss and difficulties in her situation. Read try tracking for help not eating late at night etc. she sounds like she has fairly good food choices it may be portions plus. Hidden sources. Discussed possibility of medicines bariatric surgery etc. with the risk and benefit.  But advised some nutritional help first and she agrees that is the way she wants to go. Total visit 55mns > 50% spent counseling and coordinating care as indicated in above note and in instructions to patient .  -Patient advised to return or notify health care team  if  new concerns arise.  Patient Instructions   Take  New bp medicine .   Once a day  And after a 5-7 days then stop the  Labetolol.  No  Ace inhibitors at all .   Let me know bp readings  In 2-3 weeks or so  Then plan follow up.  consider weight wawtchers as well as nutrisystem. .Standley Brooking Panosh M.D.

## 2018-05-22 NOTE — Patient Instructions (Addendum)
  Take  New bp medicine .   Once a day  And after a 5-7 days then stop the  Labetolol.  No  Ace inhibitors at all .   Let me know bp readings  In 2-3 weeks or so  Then plan follow up.  consider weight wawtchers as well as nutrisystem. [name]

## 2018-05-22 NOTE — Progress Notes (Signed)
Patient in office for a Cimzia injection. Patient provided injection. Patient was given injection in right and left lower abdomen. Patient tolerated injection well.  Administrations This Visit    Certolizumab Pegol KIT 400 mg    Admin Date 05/22/2018 Action Given Dose 400 mg Route Subcutaneous Administered By Earnestine Mealing, CMA

## 2018-06-04 ENCOUNTER — Encounter (INDEPENDENT_AMBULATORY_CARE_PROVIDER_SITE_OTHER): Payer: Commercial Managed Care - PPO

## 2018-06-06 ENCOUNTER — Encounter (INDEPENDENT_AMBULATORY_CARE_PROVIDER_SITE_OTHER): Payer: Commercial Managed Care - PPO

## 2018-06-11 ENCOUNTER — Encounter (INDEPENDENT_AMBULATORY_CARE_PROVIDER_SITE_OTHER): Payer: Self-pay | Admitting: Family Medicine

## 2018-06-11 ENCOUNTER — Ambulatory Visit (INDEPENDENT_AMBULATORY_CARE_PROVIDER_SITE_OTHER): Payer: Commercial Managed Care - PPO | Admitting: Family Medicine

## 2018-06-11 VITALS — BP 122/83 | HR 78 | Temp 97.6°F | Ht 63.0 in | Wt 288.0 lb

## 2018-06-11 DIAGNOSIS — I10 Essential (primary) hypertension: Secondary | ICD-10-CM

## 2018-06-11 DIAGNOSIS — G4700 Insomnia, unspecified: Secondary | ICD-10-CM

## 2018-06-11 DIAGNOSIS — Z6841 Body Mass Index (BMI) 40.0 and over, adult: Secondary | ICD-10-CM

## 2018-06-11 DIAGNOSIS — Z0289 Encounter for other administrative examinations: Secondary | ICD-10-CM

## 2018-06-11 DIAGNOSIS — R0602 Shortness of breath: Secondary | ICD-10-CM | POA: Diagnosis not present

## 2018-06-11 DIAGNOSIS — Z1331 Encounter for screening for depression: Secondary | ICD-10-CM | POA: Diagnosis not present

## 2018-06-11 DIAGNOSIS — R5383 Other fatigue: Secondary | ICD-10-CM | POA: Diagnosis not present

## 2018-06-11 DIAGNOSIS — Z9189 Other specified personal risk factors, not elsewhere classified: Secondary | ICD-10-CM

## 2018-06-11 NOTE — Patient Instructions (Signed)
Jennifer [name]  06/11/2018   Your procedure is scheduled on: 06-17-18  Report to [facility] Main  Entrance  Report to admitting at     1000 AM    Call this number if you have problems the morning of surgery [phone]   Remember: Do not eat food or drink liquids :After Midnight. BRUSH YOUR TEETH MORNING OF SURGERY AND RINSE YOUR MOUTH OUT, NO CHEWING GUM CANDY OR MINTS.     Take these medicines the morning of surgery with A SIP OF WATER: none                                You may not have any metal on your body including hair pins and              piercings  Do not wear jewelry,  lotions, powders or perfumes, deodorant                      Men may shave face and neck.   Do not bring valuables to the hospital. [place].  Contacts, dentures or bridgework may not be worn into surgery.     Patients discharged the day of surgery will not be allowed to drive home.  Name and phone number of your driver:  Special Instructions: N/A              Please read over the following fact sheets you were given: _____________________________________________________________________           [facility] - Preparing for Surgery Before surgery, you can play an important role.  Because skin is not sterile, your skin needs to be as free of germs as possible.  You can reduce the number of germs on your skin by washing with CHG (chlorahexidine gluconate) soap before surgery.  CHG is an antiseptic cleaner which kills germs and bonds with the skin to continue killing germs even after washing. Please DO NOT use if you have an allergy to CHG or antibacterial soaps.  If your skin becomes reddened/irritated stop using the CHG and inform your nurse when you arrive at Short Stay. Do not shave (including legs and underarms) for at least 48 hours prior to the first CHG shower.  You may shave your face/neck. Please follow these instructions  carefully:  1.  Shower with CHG Soap the night before surgery and the  morning of Surgery.  2.  If you choose to wash your hair, wash your hair first as usual with your  normal  shampoo.  3.  After you shampoo, rinse your hair and body thoroughly to remove the  shampoo.                           4.  Use CHG as you would any other liquid soap.  You can apply chg directly  to the skin and wash                       Gently with a scrungie or clean washcloth.  5.  Apply the CHG Soap to your body ONLY FROM THE NECK DOWN.   Do not use on face/  open                           Wound or open sores. Avoid contact with eyes, ears mouth and genitals (private parts).                       Wash face,  Genitals (private parts) with your normal soap.             6.  Wash thoroughly, paying special attention to the area where your surgery  will be performed.  7.  Thoroughly rinse your body with warm water from the neck down.  8.  DO NOT shower/wash with your normal soap after using and rinsing off  the CHG Soap.                9.  Pat yourself dry with a clean towel.            10.  Wear clean pajamas.            11.  Place clean sheets on your bed the night of your first shower and do not  sleep with pets. Day of Surgery : Do not apply any lotions/deodorants the morning of surgery.  Please wear clean clothes to the hospital/surgery center.  FAILURE TO FOLLOW THESE INSTRUCTIONS MAY RESULT IN THE CANCELLATION OF YOUR SURGERY PATIENT SIGNATURE_________________________________  NURSE SIGNATURE__________________________________  ________________________________________________________________________   Adam Phenix  An incentive spirometer is a tool that can help keep your lungs clear and active. This tool measures how well you are filling your lungs with each breath. Taking long deep breaths may help reverse or decrease the chance of developing breathing (pulmonary) problems (especially infection)  following:  A long period of time when you are unable to move or be active. BEFORE THE PROCEDURE   If the spirometer includes an indicator to show your best effort, your nurse or respiratory therapist will set it to a desired goal.  If possible, sit up straight or lean slightly forward. Try not to slouch.  Hold the incentive spirometer in an upright position. INSTRUCTIONS FOR USE  1. Sit on the edge of your bed if possible, or sit up as far as you can in bed or on a chair. 2. Hold the incentive spirometer in an upright position. 3. Breathe out normally. 4. Place the mouthpiece in your mouth and seal your lips tightly around it. 5. Breathe in slowly and as deeply as possible, raising the piston or the ball toward the top of the column. 6. Hold your breath for 3-5 seconds or for as long as possible. Allow the piston or ball to fall to the bottom of the column. 7. Remove the mouthpiece from your mouth and breathe out normally. 8. Rest for a few seconds and repeat Steps 1 through 7 at least 10 times every 1-2 hours when you are awake. Take your time and take a few normal breaths between deep breaths. 9. The spirometer may include an indicator to show your best effort. Use the indicator as a goal to work toward during each repetition. 10. After each set of 10 deep breaths, practice coughing to be sure your lungs are clear. If you have an incision (the cut made at the time of surgery), support your incision when coughing by placing a pillow or rolled up towels firmly against it. Once you are able to get out of bed, walk around indoors and  cough well. You may stop using the incentive spirometer when instructed by your caregiver.  RISKS AND COMPLICATIONS  Take your time so you do not get dizzy or light-headed.  If you are in pain, you may need to take or ask for pain medication before doing incentive spirometry. It is harder to take a deep breath if you are having pain. AFTER USE  Rest and  breathe slowly and easily.  It can be helpful to keep track of a log of your progress. Your caregiver can provide you with a simple table to help with this. If you are using the spirometer at home, follow these instructions: [place] IF:   You are having difficultly using the spirometer.  You have trouble using the spirometer as often as instructed.  Your pain medication is not giving enough relief while using the spirometer.  You develop fever of 100.5 F (38.1 C) or higher. SEEK IMMEDIATE MEDICAL CARE IF:   You cough up bloody sputum that had not been present before.  You develop fever of 102 F (38.9 C) or greater.  You develop worsening pain at or near the incision site. MAKE SURE YOU:   Understand these instructions.  Will watch your condition.  Will get help right away if you are not doing well or get worse. Document Released: 01/15/2007 Document Revised: 11/27/2011 Document Reviewed: 03/18/2007 [facility] Patient Information 2014 [place], [place].   ________________________________________________________________________

## 2018-06-12 ENCOUNTER — Other Ambulatory Visit: Payer: Self-pay

## 2018-06-12 ENCOUNTER — Encounter (HOSPITAL_COMMUNITY): Payer: Self-pay

## 2018-06-12 ENCOUNTER — Encounter (HOSPITAL_COMMUNITY)
Admission: RE | Admit: 2018-06-12 | Discharge: 2018-06-12 | Disposition: A | Discharge status: Home / Self Care | Payer: Commercial Managed Care - PPO | Source: Ambulatory Visit | Attending: Orthopedic Surgery | Admitting: Orthopedic Surgery

## 2018-06-12 DIAGNOSIS — Z01818 Encounter for other preprocedural examination: Secondary | ICD-10-CM | POA: Insufficient documentation

## 2018-06-12 DIAGNOSIS — M2391 Unspecified internal derangement of right knee: Secondary | ICD-10-CM | POA: Diagnosis not present

## 2018-06-12 HISTORY — DX: Sleep apnea, unspecified: G47.30

## 2018-06-12 HISTORY — DX: Personal history of other medical treatment: Z92.89

## 2018-06-12 HISTORY — DX: Depression, unspecified: F32.A

## 2018-06-12 HISTORY — DX: Major depressive disorder, single episode, unspecified: F32.9

## 2018-06-12 HISTORY — DX: Anxiety disorder, unspecified: F41.9

## 2018-06-12 LAB — LIPID PANEL
CHOLESTEROL TOTAL: 189 mg/dL (ref 100–199)
Chol/HDL Ratio: 2.7 ratio (ref 0.0–4.4)
HDL: 71 mg/dL (ref >39)
LDL Calculated: 108 mg/dL — ABNORMAL HIGH (ref 0–99)
TRIGLYCERIDES: 52 mg/dL (ref 0–149)
VLDL Cholesterol Cal: 10 mg/dL (ref 5–40)

## 2018-06-12 LAB — COMPREHENSIVE METABOLIC PANEL
ALT: 9 IU/L (ref 0–32)
AST: 11 IU/L (ref 0–40)
Albumin/Globulin Ratio: 0.9 — ABNORMAL LOW (ref 1.2–2.2)
Albumin: 3.8 g/dL (ref 3.5–5.5)
Alkaline Phosphatase: 69 IU/L (ref 39–117)
BUN/Creatinine Ratio: 11 (ref 9–23)
BUN: 9 mg/dL (ref 6–24)
Bilirubin Total: 0.3 mg/dL (ref 0.0–1.2)
CALCIUM: 8.8 mg/dL (ref 8.7–10.2)
CO2: 24 mmol/L (ref 20–29)
CREATININE: 0.85 mg/dL (ref 0.57–1.00)
Chloride: 99 mmol/L (ref 96–106)
GFR calc Af Amer: 98 mL/min/{1.73_m2} (ref >59)
GFR, EST NON AFRICAN AMERICAN: 85 mL/min/{1.73_m2} (ref >59)
GLOBULIN, TOTAL: 4.2 g/dL (ref 1.5–4.5)
Glucose: 92 mg/dL (ref 65–99)
Potassium: 3.9 mmol/L (ref 3.5–5.2)
SODIUM: 138 mmol/L (ref 134–144)
Total Protein: 8 g/dL (ref 6.0–8.5)

## 2018-06-12 LAB — CBC WITH DIFFERENTIAL/PLATELET
Basophils Absolute: 0 10*3/uL (ref 0.0–0.2)
Basos: 0 %
EOS (ABSOLUTE): 0.2 10*3/uL (ref 0.0–0.4)
Eos: 4 %
HEMOGLOBIN: 11.9 g/dL (ref 11.1–15.9)
Hematocrit: 36.6 % (ref 34.0–46.6)
IMMATURE GRANULOCYTES: 0 %
Immature Grans (Abs): 0 10*3/uL (ref 0.0–0.1)
Lymphocytes Absolute: 2.3 10*3/uL (ref 0.7–3.1)
Lymphs: 45 %
MCH: 30.2 pg (ref 26.6–33.0)
MCHC: 32.5 g/dL (ref 31.5–35.7)
MCV: 93 fL (ref 79–97)
MONOCYTES: 12 %
MONOS ABS: 0.6 10*3/uL (ref 0.1–0.9)
NEUTROS PCT: 39 %
Neutrophils Absolute: 2 10*3/uL (ref 1.4–7.0)
Platelets: 310 10*3/uL (ref 150–450)
RBC: 3.94 x10E6/uL (ref 3.77–5.28)
RDW: 14.4 % (ref 12.3–15.4)
WBC: 5.1 10*3/uL (ref 3.4–10.8)

## 2018-06-12 LAB — HEMOGLOBIN A1C
ESTIMATED AVERAGE GLUCOSE: 114 mg/dL
Hgb A1c MFr Bld: 5.6 % (ref 4.8–5.6)

## 2018-06-12 LAB — T4, FREE: FREE T4: 1.25 ng/dL (ref 0.82–1.77)

## 2018-06-12 LAB — TSH: TSH: 1.95 u[IU]/mL (ref 0.450–4.500)

## 2018-06-12 LAB — VITAMIN D 25 HYDROXY (VIT D DEFICIENCY, FRACTURES): Vit D, 25-Hydroxy: 18.9 ng/mL — ABNORMAL LOW (ref 30.0–100.0)

## 2018-06-12 LAB — FOLATE: Folate: 11.1 ng/mL (ref >3.0)

## 2018-06-12 LAB — T3: T3, Total: 103 ng/dL (ref 71–180)

## 2018-06-12 LAB — INSULIN, RANDOM: INSULIN: 10.1 u[IU]/mL (ref 2.6–24.9)

## 2018-06-12 LAB — VITAMIN B12: Vitamin B-12: 953 pg/mL (ref 232–1245)

## 2018-06-12 LAB — PREGNANCY, URINE: PREG TEST UR: NEGATIVE

## 2018-06-12 NOTE — Progress Notes (Signed)
Cbc/diff and cmp done 06-11-18 in epic  And ekg 06-11-18 in epic

## 2018-06-12 NOTE — Progress Notes (Signed)
Office: [phone]  /  Fax: [phone]   Dear Dr. Durward Fortes,   Thank you for referring [name] to our clinic. The following note includes my evaluation and treatment recommendations.  HPI:   Chief Complaint: Ripley has been referred by Vonna Kotyk. Durward Fortes, MD for consultation regarding her obesity and obesity related comorbidities.    CIONNA COLLANTES (MR# [id number]) is a 42 y.o. female who presents on 06/11/2018 for obesity evaluation and treatment. Current BMI is Body mass index is 51.02 kg/m.[name] Brentlee has been struggling with her weight for many years and has been unsuccessful in either losing weight, maintaining weight loss, or reaching her healthy weight goal.     Olivia Mackie attended our information session and states she is currently in the action stage of change and ready to dedicate time achieving and maintaining a healthier weight. Marykathleen is interested in becoming our patient and working on intensive lifestyle modifications including (but not limited to) diet, exercise and weight loss.    Nylah states she thinks her family will eat healthier with  her her desired weight loss is 88-103 lbs she has been heavy most of  her life she started gaining weight at 31 her heaviest weight ever was 288 lbs she is a picky eater and doesn't like to eat healthier foods  she has significant food cravings issues  she skips meals frequently she is frequently drinking liquids with calories she frequently makes poor food choices she frequently eats larger portions than normal  she struggles with emotional eating    Fatigue Kyarra feels her energy is lower than it should be. This has worsened with weight gain and has not worsened recently. Secilia admits to daytime somnolence and  admits to waking up still tired. Patient has a history of obstructive sleep apnea with the use of CPAP. Patent has a history of symptoms of daytime fatigue and morning headache. Patient generally gets 5  hours of sleep per night, and states they generally have nightime awakenings. Snoring is present. Apneic episodes are present. Epworth Sleepiness Score is 4.  Dyspnea on exertion Olivia Mackie notes increasing shortness of breath with exercising and seems to be worsening over time with weight gain. She notes getting out of breath sooner with activity than she used to. This has not gotten worse recently. EKG-left ventricular hypertrophy. Arnett denies orthopnea.  Hypertension VERBLE STYRON is a 42 y.o. female with hypertension. Keyerra's blood pressure is controlled today. She denies chest pain, chest pressure, or headache. She is working weight loss to help control her blood pressure with the goal of decreasing her risk of heart attack and stroke.   At risk for cardiovascular disease Kataleia is at a higher than average risk for cardiovascular disease due to obesity and hypertension. She currently denies any chest pain.  Insomnia Takara complains of insomnia. She states she only sleeps 4-6 hours per night on average. She takes Melatonin nightly, and she just started CPAP.    Depression Screen Sharnae's Food and Mood (modified PHQ-9) score was  Depression screen PHQ 2/9 06/11/2018  Decreased Interest 1  Down, Depressed, Hopeless 0  PHQ - 2 Score 1  Altered sleeping 1  Tired, decreased energy 1  Change in appetite 1  Feeling bad or failure about yourself  1  Trouble concentrating 0  Moving slowly or fidgety/restless 1  Suicidal thoughts 0  PHQ-9 Score 6  Difficult doing work/chores Not difficult at all    ALLERGIES: Allergies  Allergen Reactions  . Lisinopril-Hydrochlorothiazide Swelling and Other (See Comments)    Angioedema  Has tolerated maxide in past so most likely  The ACE inhibitor as the cause     MEDICATIONS: Current Outpatient Medications on File Prior to Visit  Medication Sig Dispense Refill  . amLODipine (NORVASC) 5 MG tablet Take 1 tablet (5 mg total) by mouth daily. (Patient  taking differently: Take 5 mg by mouth at bedtime. ) 30 tablet 3  . Certolizumab Pegol (CIMZIA) 2 X 200 MG KIT Inject 400 mg into the skin every 28 (twenty-eight) days. 1 each 2  . clindamycin-benzoyl peroxide (BENZACLIN) gel Apply 1 application topically every other day.     . diclofenac sodium (VOLTAREN) 1 % GEL Apply three grams to three large joints up to three times daily (Patient taking differently: Apply 3 g topically 2 (two) times daily. ) 3 Tube 3  . fluocinonide ointment (LIDEX) 7.82 % Apply 1 application topically 2 (two) times daily.   5  . iron polysaccharides (FERREX 150) 150 MG capsule Take 1 capsule (150 mg total) by mouth daily. 30 capsule 5  . labetalol (NORMODYNE) 200 MG tablet Take 1 tablet (200 mg total) by mouth daily. (Patient taking differently: Take 200 mg by mouth at bedtime. ) 30 tablet 0  . Melatonin 10 MG CAPS Take 20 mg by mouth at bedtime as needed (for sleep).    . methocarbamol (ROBAXIN) 500 MG tablet Take 500 mg by mouth 2 (two) times daily between meals as needed for muscle spasms.    [name] tretinoin (RETIN-A) 0.025 % gel Apply 1 application topically at bedtime. At night  5   No current facility-administered medications on file prior to visit.     PAST MEDICAL HISTORY: Past Medical History:  Diagnosis Date  . Acute otitis media 08/28/2012   improved  change to liquid medication   . Anemia   . Breast abscess of female    Recurrent  . Family history of adverse reaction to anesthesia    father hard time waking once (12/20/2016)  . Fibroids    w/bleeding  . GERD (gastroesophageal reflux disease)   . Hypertension   . Migraines    Dr. Orie Rout; "I have a few/month" (12/20/2016)  . Osteoarthritis   . Pill esophagitis 08/28/2012   amoxicillin  by hx  disc plan nl voice except hoarse not drooling  close fu  if not getting better with plan stop aleve  liquid ibu onlyf necessary   . Recurrent periodic urticaria   . Rheumatoid arthritis ([place])    "55% of my  body" (12/20/2016)    PAST SURGICAL HISTORY: Past Surgical History:  Procedure Laterality Date  . BREAST SURGERY    . CYST EXCISION Left 2014   "elbow"  . HERNIA REPAIR    . INCISION AND DRAINAGE BREAST ABSCESS Left 2003  . INCISIONAL HERNIA REPAIR N/A 12/18/2016   Procedure: REPAIR INCISIONAL HERNIA WITH MESH;  Surgeon: Georganna Skeans, MD;  Location: [facility];  Service: General;  Laterality: N/A;  . INSERTION OF MESH N/A 12/18/2016   Procedure: INSERTION OF MESH;  Surgeon: Georganna Skeans, MD;  Location: [facility];  Service: General;  Laterality: N/A;  . MYOMECTOMY N/A 09/06/2016   Procedure: Lendell Caprice;  Surgeon: Governor Specking, MD;  Location: [facility];  Service: Gynecology;  Laterality: N/A;  . UTERINE FIBROID SURGERY      SOCIAL HISTORY: Social History   Tobacco Use  . Smoking status: Never Smoker  .  Smokeless tobacco: Never Used  Substance Use Topics  . Alcohol use: Yes    Comment: social  . Drug use: Never    FAMILY HISTORY: Family History  Problem Relation Age of Onset  . Hypertension Mother   . Rheum arthritis Mother   . Hypertension Father   . Sleep apnea Father     ROS: Review of Systems  Constitutional: Positive for malaise/fatigue. Negative for weight loss.       + Trouble sleeping  Eyes:       + Wear glasses or contacts (at times)  Cardiovascular: Negative for chest pain and orthopnea.       Negative chest pressure  Musculoskeletal: Positive for joint pain.       + Swollen joints  Neurological: Positive for headaches (migraines).  Psychiatric/Behavioral: Positive for depression. Negative for suicidal ideas. The patient has insomnia.        + Stress    PHYSICAL EXAM: Blood pressure 122/83, pulse 78, temperature 97.6 F (36.4 C), temperature source Oral, height '5\' 3"'$  (1.6 m), weight 288 lb (130.6 kg), last menstrual period 06/02/2018, SpO2 100 %. Body mass index is 51.02 kg/m. Physical Exam  Constitutional: She is oriented to person, place, and  time. She appears well-developed and well-nourished.  HENT:  Head: Normocephalic and atraumatic.  Nose: Nose normal.  Eyes: EOM are normal. No scleral icterus.  Neck: Normal range of motion. Neck supple. No thyromegaly present.  Cardiovascular: Normal rate and regular rhythm.  Pulmonary/Chest: Effort normal. No respiratory distress.  Abdominal: Soft. There is no tenderness.  + Obesity  Musculoskeletal:  Range of Motion normal in all 4 extremities Trace edema noted in bilateral lower extremities  Neurological: She is alert and oriented to person, place, and time. Coordination normal.  Skin: Skin is warm and dry.  Psychiatric: She has a normal mood and affect. Her behavior is normal.  Vitals reviewed.   RECENT LABS AND TESTS: BMET    Component Value Date/Time   NA 138 06/11/2018 1212   K 3.9 06/11/2018 1212   CL 99 06/11/2018 1212   CO2 24 06/11/2018 1212   GLUCOSE 92 06/11/2018 1212   GLUCOSE 90 01/28/2018 1503   BUN 9 06/11/2018 1212   CREATININE 0.85 06/11/2018 1212   CREATININE 0.99 01/28/2018 1503   CALCIUM 8.8 06/11/2018 1212   GFRNONAA 85 06/11/2018 1212   GFRNONAA 70 01/28/2018 1503   GFRAA 98 06/11/2018 1212   GFRAA 81 01/28/2018 1503   Lab Results  Component Value Date   HGBA1C 5.6 06/11/2018   Lab Results  Component Value Date   INSULIN 10.1 06/11/2018   CBC    Component Value Date/Time   WBC 5.1 06/11/2018 1212   WBC 6.3 01/28/2018 1503   RBC 3.94 06/11/2018 1212   RBC 3.78 (L) 01/28/2018 1503   HGB 11.9 06/11/2018 1212   HCT 36.6 06/11/2018 1212   PLT 310 06/11/2018 1212   MCV 93 06/11/2018 1212   MCH 30.2 06/11/2018 1212   MCH 31.0 01/28/2018 1503   MCHC 32.5 06/11/2018 1212   MCHC 34.3 01/28/2018 1503   RDW 14.4 06/11/2018 1212   LYMPHSABS 2.3 06/11/2018 1212   MONOABS 928 03/07/2017 1632   EOSABS 0.2 06/11/2018 1212   BASOSABS 0.0 06/11/2018 1212   Iron/TIBC/Ferritin/ %Sat No results found for: IRON, TIBC, FERRITIN, IRONPCTSAT Lipid  Panel     Component Value Date/Time   CHOL 189 06/11/2018 1212   TRIG 52 06/11/2018 1212   HDL 71 06/11/2018  1212   CHOLHDL 2.7 06/11/2018 1212   LDLCALC 108 (H) 06/11/2018 1212   Hepatic Function Panel     Component Value Date/Time   PROT 8.0 06/11/2018 1212   ALBUMIN 3.8 06/11/2018 1212   AST 11 06/11/2018 1212   ALT 9 06/11/2018 1212   ALKPHOS 69 06/11/2018 1212   BILITOT 0.3 06/11/2018 1212   BILIDIR 0.2 10/29/2009 0835      Component Value Date/Time   TSH 1.950 06/11/2018 1212    ECG  shows NSR with a rate of 83 BPM INDIRECT CALORIMETER done today shows a VO2 of 249 and a REE of 1732.  Her calculated basal metabolic rate is 6578 thus her basal metabolic rate is worse than expected.    ASSESSMENT AND PLAN: Other fatigue - Plan: EKG 12-Lead, Hemoglobin A1c, Insulin, random, VITAMIN D 25 Hydroxy (Vit-D Deficiency, Fractures), Vitamin B12, Folate, T3, T4, free, TSH  Shortness of breath on exertion  Essential hypertension - Plan: CBC with Differential/Platelet, Comprehensive metabolic panel, Lipid panel  Insomnia, unspecified type  Depression screening  At risk for heart disease  Class 3 severe obesity with serious comorbidity and body mass index (BMI) of 50.0 to 59.9 in adult, unspecified obesity type (HCC)  PLAN:  Fatigue Shavawn was informed that her fatigue may be related to obesity, depression or many other causes. Labs will be ordered, and in the meanwhile Seidy has agreed to work on diet, exercise and weight loss to help with fatigue. Proper sleep hygiene was discussed including the need for 7-8 hours of quality sleep each night. A sleep study was not ordered based on symptoms and Epworth score.  Dyspnea on exertion Caitlan's shortness of breath appears to be obesity related and exercise induced. She has agreed to work on weight loss and gradually increase exercise to treat her exercise induced shortness of breath. If Lior follows our instructions and loses  weight without improvement of her shortness of breath, we will plan to refer to pulmonology. We will monitor this condition regularly. Vielka agrees to this plan.  Hypertension We discussed sodium restriction, working on healthy weight loss, and a regular exercise program as the means to achieve improved blood pressure control. Deaven agreed with this plan and agreed to follow up as directed. We will continue to monitor her blood pressure as well as her progress with the above lifestyle modifications. She will continue her medications as prescribed and will watch for signs of hypotension as she continues her lifestyle modifications. We will check labs today and follow up on blood pressure at next appointment. Tima agrees to follow up with our clinic in 2 weeks.  Cardiovascular risk counselling Adithi was given extended (15 minutes) coronary artery disease prevention counseling today. She is 42 y.o. female and has risk factors for heart disease including obesity and hypertension. We discussed intensive lifestyle modifications today with an emphasis on specific weight loss instructions and strategies. Pt was also informed of the importance of increasing exercise and decreasing saturated fats to help prevent heart disease.  Insomnia The problem of recurrent insomnia was discussed. Avoidance of caffeine sources was strongly encouraged, and we discussed sleep hygiene and Melatonin timing. Antoinett agrees to follow up with our clinic in 2 weeks.   Depression Screen Fallan had a mildly positive depression screening. Depression is commonly associated with obesity and often results in emotional eating behaviors. We will monitor this closely and work on CBT to help improve the non-hunger eating patterns. Referral to Psychology may be required  if no improvement is seen as she continues in our clinic.  Obesity Skarlett is currently in the action stage of change and her goal is to continue with weight loss efforts. I  recommend Iola begin the structured treatment plan as follows:  She has agreed to follow the Category 2 plan + 100 calories as needed Jancy has been instructed to eventually work up to a goal of 150 minutes of combined cardio and strengthening exercise per week for weight loss and overall health benefits. We discussed the following Behavioral Modification Strategies today: increasing lean protein intake, increasing vegetables, work on meal planning and easy cooking plans, and planning for success   She was informed of the importance of frequent follow up visits to maximize her success with intensive lifestyle modifications for her multiple health conditions. She was informed we would discuss her lab results at her next visit unless there is a critical issue that needs to be addressed sooner. Dustine agreed to keep her next visit at the agreed upon time to discuss these results.    OBESITY BEHAVIORAL INTERVENTION VISIT  Today's visit was # 1   Starting weight: 288 lbs Starting date: 06/11/18 Today's weight : 288 lbs  Today's date: 06/11/2018 Total lbs lost to date: 0    ASK: We discussed the diagnosis of obesity with [name] today and Cia agreed to give [place] permission to discuss obesity behavioral modification therapy today.  ASSESS: Ashaunte has the diagnosis of obesity and her BMI today is 51.03 Loria is in the action stage of change   ADVISE: Jalisia was educated on the multiple health risks of obesity as well as the benefit of weight loss to improve her health. She was advised of the need for long term treatment and the importance of lifestyle modifications to improve her current health and to decrease her risk of future health problems.  AGREE: Multiple dietary modification options and treatment options were discussed and  Lateshia agreed to follow the recommendations documented in the above note.  ARRANGE: Camaya was educated on the importance of frequent visits to treat obesity  as outlined per CMS and USPSTF guidelines and agreed to schedule her next follow up appointment today.  I, Trixie Dredge, am acting as transcriptionist for Ilene Qua, MD  I have reviewed the above documentation for accuracy and completeness, and I agree with the above. - Ilene Qua, MD

## 2018-06-13 DIAGNOSIS — L658 Other specified nonscarring hair loss: Secondary | ICD-10-CM | POA: Insufficient documentation

## 2018-06-17 ENCOUNTER — Encounter (HOSPITAL_COMMUNITY): Payer: Self-pay | Admitting: *Deleted

## 2018-06-17 ENCOUNTER — Ambulatory Visit (HOSPITAL_COMMUNITY): Payer: Commercial Managed Care - PPO | Admitting: Anesthesiology

## 2018-06-17 ENCOUNTER — Encounter (HOSPITAL_COMMUNITY)
Admission: RE | Disposition: A | Discharge status: Home / Self Care | Payer: Self-pay | Source: Ambulatory Visit | Attending: Orthopedic Surgery

## 2018-06-17 ENCOUNTER — Ambulatory Visit (HOSPITAL_COMMUNITY)
Admission: RE | Admit: 2018-06-17 | Discharge: 2018-06-17 | Disposition: A | Discharge status: Home / Self Care | Payer: Commercial Managed Care - PPO | Source: Ambulatory Visit | Attending: Orthopedic Surgery | Admitting: Orthopedic Surgery

## 2018-06-17 DIAGNOSIS — M19041 Primary osteoarthritis, right hand: Secondary | ICD-10-CM | POA: Insufficient documentation

## 2018-06-17 DIAGNOSIS — M23221 Derangement of posterior horn of medial meniscus due to old tear or injury, right knee: Secondary | ICD-10-CM | POA: Insufficient documentation

## 2018-06-17 DIAGNOSIS — Z888 Allergy status to other drugs, medicaments and biological substances status: Secondary | ICD-10-CM | POA: Diagnosis not present

## 2018-06-17 DIAGNOSIS — M170 Bilateral primary osteoarthritis of knee: Secondary | ICD-10-CM | POA: Insufficient documentation

## 2018-06-17 DIAGNOSIS — I10 Essential (primary) hypertension: Secondary | ICD-10-CM | POA: Insufficient documentation

## 2018-06-17 DIAGNOSIS — F419 Anxiety disorder, unspecified: Secondary | ICD-10-CM | POA: Insufficient documentation

## 2018-06-17 DIAGNOSIS — M069 Rheumatoid arthritis, unspecified: Secondary | ICD-10-CM | POA: Diagnosis not present

## 2018-06-17 DIAGNOSIS — M65861 Other synovitis and tenosynovitis, right lower leg: Secondary | ICD-10-CM | POA: Diagnosis not present

## 2018-06-17 DIAGNOSIS — Z9989 Dependence on other enabling machines and devices: Secondary | ICD-10-CM | POA: Diagnosis not present

## 2018-06-17 DIAGNOSIS — F329 Major depressive disorder, single episode, unspecified: Secondary | ICD-10-CM | POA: Diagnosis not present

## 2018-06-17 DIAGNOSIS — M19042 Primary osteoarthritis, left hand: Secondary | ICD-10-CM | POA: Insufficient documentation

## 2018-06-17 DIAGNOSIS — G4730 Sleep apnea, unspecified: Secondary | ICD-10-CM | POA: Insufficient documentation

## 2018-06-17 DIAGNOSIS — Z6841 Body Mass Index (BMI) 40.0 and over, adult: Secondary | ICD-10-CM | POA: Diagnosis not present

## 2018-06-17 HISTORY — PX: KNEE ARTHROSCOPY WITH MENISCAL REPAIR: SHX5653

## 2018-06-17 SURGERY — Surgical Case
Anesthesia: *Unknown

## 2018-06-17 SURGERY — ARTHROSCOPY, KNEE, WITH MENISCUS REPAIR
Anesthesia: General | Site: Knee | Laterality: Right

## 2018-06-17 MED ORDER — FENTANYL CITRATE (PF) 100 MCG/2ML IJ SOLN
25.0000 ug | INTRAMUSCULAR | Status: DC | PRN
  Administered 2018-06-17 (×3): 25 ug via INTRAVENOUS
  Administered 2018-06-17: 50 ug via INTRAVENOUS
  Administered 2018-06-17: 25 ug via INTRAVENOUS

## 2018-06-17 MED ORDER — DEXAMETHASONE SODIUM PHOSPHATE 10 MG/ML IJ SOLN
INTRAMUSCULAR | Status: DC | PRN
  Administered 2018-06-17: 10 mg via INTRAVENOUS

## 2018-06-17 MED ORDER — ONDANSETRON HCL 4 MG/2ML IJ SOLN
INTRAMUSCULAR | Status: DC | PRN
  Administered 2018-06-17: 4 mg via INTRAVENOUS

## 2018-06-17 MED ORDER — LACTATED RINGERS IR SOLN
Status: DC | PRN
  Administered 2018-06-17: 3000 mL

## 2018-06-17 MED ORDER — FENTANYL CITRATE (PF) 100 MCG/2ML IJ SOLN
INTRAMUSCULAR | Status: AC
  Filled 2018-06-17: qty 2

## 2018-06-17 MED ORDER — BUPIVACAINE-EPINEPHRINE 0.5% -1:200000 IJ SOLN
INTRAMUSCULAR | Status: DC | PRN
  Administered 2018-06-17: 20 mL

## 2018-06-17 MED ORDER — ONDANSETRON HCL 4 MG/2ML IJ SOLN: 4.0000 mg | Freq: Once | INTRAMUSCULAR | Status: DC | PRN

## 2018-06-17 MED ORDER — PROPOFOL 10 MG/ML IV BOLUS
INTRAVENOUS | Status: AC
  Filled 2018-06-17: qty 20

## 2018-06-17 MED ORDER — FENTANYL CITRATE (PF) 100 MCG/2ML IJ SOLN
INTRAMUSCULAR | Status: DC | PRN
  Administered 2018-06-17: 100 ug via INTRAVENOUS

## 2018-06-17 MED ORDER — LACTATED RINGERS IV SOLN
INTRAVENOUS | Status: DC
  Administered 2018-06-17: 11:00:00 via INTRAVENOUS

## 2018-06-17 MED ORDER — ONDANSETRON HCL 4 MG/2ML IJ SOLN
INTRAMUSCULAR | Status: AC
  Filled 2018-06-17: qty 2

## 2018-06-17 MED ORDER — PROPOFOL 10 MG/ML IV BOLUS
INTRAVENOUS | Status: DC | PRN
  Administered 2018-06-17: 70 mg via INTRAVENOUS
  Administered 2018-06-17: 200 mg via INTRAVENOUS

## 2018-06-17 MED ORDER — STERILE WATER FOR IRRIGATION IR SOLN
Status: DC | PRN
  Administered 2018-06-17: 1000 mL

## 2018-06-17 MED ORDER — LIDOCAINE 2% (20 MG/ML) 5 ML SYRINGE
INTRAMUSCULAR | Status: DC | PRN
  Administered 2018-06-17 (×2): 25 mg via INTRAVENOUS
  Administered 2018-06-17: 100 mg via INTRAVENOUS
  Administered 2018-06-17 (×2): 25 mg via INTRAVENOUS

## 2018-06-17 MED ORDER — FENTANYL CITRATE (PF) 100 MCG/2ML IJ SOLN
INTRAMUSCULAR | Status: AC
  Administered 2018-06-17: 25 ug via INTRAVENOUS
  Filled 2018-06-17: qty 2

## 2018-06-17 MED ORDER — OXYCODONE HCL 5 MG PO TABS: 5.0000 mg | ORAL_TABLET | ORAL | 0 refills | Status: DC | PRN

## 2018-06-17 MED ORDER — CHLORHEXIDINE GLUCONATE 4 % EX LIQD
60.0000 mL | Freq: Once | CUTANEOUS | Status: AC
  Administered 2018-06-17: 4 via TOPICAL

## 2018-06-17 MED ORDER — DEXAMETHASONE SODIUM PHOSPHATE 10 MG/ML IJ SOLN
INTRAMUSCULAR | Status: AC
  Filled 2018-06-17: qty 1

## 2018-06-17 MED ORDER — BUPIVACAINE-EPINEPHRINE (PF) 0.5% -1:200000 IJ SOLN
INTRAMUSCULAR | Status: AC
  Filled 2018-06-17: qty 30

## 2018-06-17 SURGICAL SUPPLY — 29 items
BANDAGE ACE 6X5 VEL STRL LF (GAUZE/BANDAGES/DRESSINGS) ×2 IMPLANT
BLADE CUTTER GATOR 3.5 (BLADE) ×2 IMPLANT
BLADE GREAT WHITE 4.2 (BLADE) IMPLANT
BLADE SURG SZ11 CARB STEEL (BLADE) IMPLANT
BUR OVAL 6.0 (BURR) IMPLANT
COVER SURGICAL LIGHT HANDLE (MISCELLANEOUS) ×2 IMPLANT
CUFF TOURN SGL QUICK 34 (TOURNIQUET CUFF) ×1
CUFF TRNQT CYL 34X4X40X1 (TOURNIQUET CUFF) ×1 IMPLANT
DRAPE U-SHAPE 47X51 STRL (DRAPES) ×2 IMPLANT
DRSG PAD ABDOMINAL 8X10 ST (GAUZE/BANDAGES/DRESSINGS) ×2 IMPLANT
GAUZE SPONGE 4X4 12PLY STRL (GAUZE/BANDAGES/DRESSINGS) ×2 IMPLANT
GAUZE XEROFORM 1X8 LF (GAUZE/BANDAGES/DRESSINGS) ×2 IMPLANT
GLOVE ORTHO TXT STRL SZ7.5 (GLOVE) ×2 IMPLANT
IV LACTATED RINGER IRRG 3000ML (IV SOLUTION) ×4
IV LR IRRIG 3000ML ARTHROMATIC (IV SOLUTION) ×4 IMPLANT
KIT BASIN OR (CUSTOM PROCEDURE TRAY) IMPLANT
MANIFOLD NEPTUNE II (INSTRUMENTS) ×4 IMPLANT
NDL SAFETY ECLIPSE 18X1.5 (NEEDLE) ×1 IMPLANT
NEEDLE HYPO 18GX1.5 SHARP (NEEDLE) ×1
PACK ARTHROSCOPY WL (CUSTOM PROCEDURE TRAY) ×2 IMPLANT
POSITIONER SURGICAL ARM (MISCELLANEOUS) ×2 IMPLANT
PROBE BIPOLAR ATHRO 135MM 90D (MISCELLANEOUS) ×2 IMPLANT
SUT ETHILON 4 0 PS 2 18 (SUTURE) ×2 IMPLANT
SYR 30ML LL (SYRINGE) ×2 IMPLANT
TUBING ARTHRO INFLOW-ONLY STRL (TUBING) ×2 IMPLANT
TUBING CONNECTING 10 (TUBING) ×2 IMPLANT
TUBING CONNECTOR 18X5MM (MISCELLANEOUS) ×2 IMPLANT
WAND HAND CNTRL MULTIVAC 90 (MISCELLANEOUS) ×2 IMPLANT
WRAP KNEE MAXI GEL POST OP (GAUZE/BANDAGES/DRESSINGS) ×2 IMPLANT

## 2018-06-17 NOTE — Anesthesia Procedure Notes (Signed)
Procedure Name: LMA Insertion Date/Time: 06/17/2018 12:08 PM Performed by: Anne Fu, CRNA Pre-anesthesia Checklist: Patient identified, Emergency Drugs available, Suction available, Patient being monitored and Timeout performed Patient Re-evaluated:Patient Re-evaluated prior to induction Oxygen Delivery Method: Circle system utilized Preoxygenation: Pre-oxygenation with 100% oxygen Induction Type: IV induction Ventilation: Mask ventilation without difficulty LMA: LMA inserted LMA Size: 5.0 Number of attempts: 1 Placement Confirmation: positive ETCO2 and breath sounds checked- equal and bilateral Tube secured with: Tape Difficulty Due To: Difficult Airway- due to limited oral opening and Difficult Airway- due to large tongue Comments: LMA # 5 placed noted difficult due to limited mouth opening placed X 2 attempts with tongue depressor.  Teeth as per pre op noted clear bilat lung sounds post placement.

## 2018-06-17 NOTE — Transfer of Care (Signed)
Immediate Anesthesia Transfer of Care Note  Patient: Jennifer [name]  Procedure(s) Performed: Procedure(s): RIGHT KNEE ARTHROSCOPY WITH MENISCAL REPAIR (Right)  Patient Location: PACU  Anesthesia Type:General  Level of Consciousness:  sedated, patient cooperative and responds to stimulation  Airway & Oxygen Therapy:Patient Spontanous Breathing and Patient connected to face mask oxgen  Post-op Assessment:  Report given to PACU RN and Post -op Vital signs reviewed and stable  Post vital signs:  Reviewed and stable  Last Vitals:  Vitals:   06/17/18 1024  BP: (!) 154/103  Pulse: 78  Resp: 18  Temp: 36.8 C  SpO2: 199%    Complications: No apparent anesthesia complications

## 2018-06-17 NOTE — Anesthesia Postprocedure Evaluation (Signed)
Anesthesia Post Note  Patient: Jennifer [name]  Procedure(s) Performed: RIGHT KNEE ARTHROSCOPY WITH MENISCAL REPAIR (Right Knee)     Patient location during evaluation: PACU Anesthesia Type: General Level of consciousness: awake and alert Pain management: pain level controlled Vital Signs Assessment: post-procedure vital signs reviewed and stable Respiratory status: spontaneous breathing, nonlabored ventilation, respiratory function stable and patient connected to nasal cannula oxygen Cardiovascular status: blood pressure returned to baseline and stable Postop Assessment: no apparent nausea or vomiting Anesthetic complications: no    Last Vitals:  Vitals:   06/17/18 1500 06/17/18 1529  BP: (!) 171/103 (!) 171/110  Pulse: 78 80  Resp: 18 18  Temp: 36.8 C 36.9 C  SpO2: 97% 100%    Last Pain:  Vitals:   06/17/18 1529  TempSrc:   PainSc: 5                  Gabrielle Wakeland P Tavion Senkbeil

## 2018-06-17 NOTE — Anesthesia Preprocedure Evaluation (Addendum)
Anesthesia Evaluation  Patient identified by MRN, date of birth, ID band Patient awake    Reviewed: Allergy & Precautions, NPO status , Patient's Chart, lab work & pertinent test results, reviewed documented beta blocker date and time   Airway Mallampati: II  TM Distance: >3 FB Neck ROM: Full    Dental no notable dental hx.    Pulmonary sleep apnea and Continuous Positive Airway Pressure Ventilation ,    Pulmonary exam normal breath sounds clear to auscultation       Cardiovascular hypertension, Pt. on medications and Pt. on home beta blockers Normal cardiovascular exam Rhythm:Regular Rate:Normal  ECG: SR, LAE, possible LVH. Rate 83   Neuro/Psych  Headaches, PSYCHIATRIC DISORDERS Anxiety Depression    GI/Hepatic negative GI ROS, Neg liver ROS,   Endo/Other  Morbid obesitySuper obese  Renal/GU negative Renal ROS     Musculoskeletal  (+) Arthritis , Rheumatoid disorders,    Abdominal (+) + obese,   Peds  Hematology negative hematology ROS (+)   Anesthesia Other Findings Right Torn Medial Meniscus  Reproductive/Obstetrics hcg negative                            Anesthesia Physical Anesthesia Plan  ASA: IV  Anesthesia Plan: General   Post-op Pain Management:    Induction: Intravenous  PONV Risk Score and Plan: 3 and Midazolam, Dexamethasone, Ondansetron and Treatment may vary due to age or medical condition  Airway Management Planned: LMA  Additional Equipment:   Intra-op Plan:   Post-operative Plan: Extubation in OR  Informed Consent: I have reviewed the patients History and Physical, chart, labs and discussed the procedure including the risks, benefits and alternatives for the proposed anesthesia with the patient or authorized representative who has indicated his/her understanding and acceptance.   Dental advisory given  Plan Discussed with: CRNA  Anesthesia Plan Comments:         Anesthesia Quick Evaluation

## 2018-06-17 NOTE — H&P (Signed)
Jennifer [name] MRN:  [id number] DOB/SEX:  12/13/1975/female  CHIEF COMPLAINT:  Painful right Knee  HISTORY: Patient is a 42 y.o. female presented with a history of pain in the right knee. Onset of symptoms was gradual starting a few months ago with gradually worsening course since that time. Patient has been treated conservatively with over-the-counter NSAIDs and activity modification. Patient currently rates pain in the knee at 10 out of 10 with activity. There is pain at night.  PAST MEDICAL HISTORY: Patient Active Problem List   Diagnosis Date Noted  . Primary osteoarthritis of both hands 02/04/2018  . Primary osteoarthritis of both knees 05/22/2017  . Incisional hernia 12/18/2016  . Leiomyoma of uterus 09/06/2016  . Fibroid, uterine   . Essential hypertension 05/25/2015  . Recurrent headache 05/25/2015  . Head lump 07/31/2014  . Edema 12/29/2013  . Baker's cyst, ruptured 12/25/2013  . Cough, persistent 12/12/2013  . Left leg swelling 12/12/2013  . Cough 12/12/2013  . High risk medication use 12/12/2013  . Recurrent periodic urticaria   . Rheumatoid arthritis ([place]) 08/28/2012  . CHRONIC LARYNGITIS 11/03/2010  . ALLERGIC RHINITIS 11/03/2010  . PARESTHESIA 07/28/2010  . DYSMENORRHEA 10/15/2007  . FIBROIDS, UTERUS 07/24/2007  . OBESITY 07/24/2007  . SLEEPLESSNESS 07/24/2007  . HEADACHE 07/24/2007   Past Medical History:  Diagnosis Date  . Acute otitis media 08/28/2012   improved  change to liquid medication   . Anxiety   . Breast abscess of female    Recurrent  . Depression   . Family history of adverse reaction to anesthesia    father hard time waking once (12/20/2016)  . Fibroids    w/bleeding  . GERD (gastroesophageal reflux disease)   . History of blood transfusion    after surgery  . Hypertension   . Migraines    Dr. Orie Rout; "I have a few/month" (12/20/2016)  . Osteoarthritis   . Pill esophagitis 08/28/2012   amoxicillin  by hx  disc plan nl voice  except hoarse not drooling  close fu  if not getting better with plan stop aleve  liquid ibu onlyf necessary   . Recurrent periodic urticaria   . Rheumatoid arthritis ([place])    "55% of my body" (12/20/2016)  . Sleep apnea    wears cpap   Past Surgical History:  Procedure Laterality Date  . BREAST SURGERY     abcess under left breast   . CYST EXCISION Left 2014   "elbow"  . HERNIA REPAIR    . INCISION AND DRAINAGE BREAST ABSCESS Left 2003  . INCISIONAL HERNIA REPAIR N/A 12/18/2016   Procedure: REPAIR INCISIONAL HERNIA WITH MESH;  Surgeon: Georganna Skeans, MD;  Location: [facility];  Service: General;  Laterality: N/A;  . INSERTION OF MESH N/A 12/18/2016   Procedure: INSERTION OF MESH;  Surgeon: Georganna Skeans, MD;  Location: [facility];  Service: General;  Laterality: N/A;  . MYOMECTOMY N/A 09/06/2016   Procedure: Lendell Caprice;  Surgeon: Governor Specking, MD;  Location: [facility];  Service: Gynecology;  Laterality: N/A;  . UTERINE FIBROID SURGERY     34 fibroids     MEDICATIONS:   No medications prior to admission.    ALLERGIES:   Allergies  Allergen Reactions  . Lisinopril-Hydrochlorothiazide Swelling and Other (See Comments)    Angioedema  Has tolerated maxide in past so most likely  The ACE inhibitor as the cause     REVIEW OF SYSTEMS:  A comprehensive review of systems was negative except for:  Musculoskeletal: positive for bone pain and stiff joints   FAMILY HISTORY:   Family History  Problem Relation Age of Onset  . Hypertension Mother   . Rheum arthritis Mother   . Hypertension Father   . Sleep apnea Father     SOCIAL HISTORY:   Social History   Tobacco Use  . Smoking status: Never Smoker  . Smokeless tobacco: Never Used  Substance Use Topics  . Alcohol use: Yes    Comment: social     EXAMINATION:  Vital signs in last 24 hours:    LMP 06/02/2018   General Appearance:    Alert, cooperative, no distress, appears stated age  Head:    Normocephalic, without  obvious abnormality, atraumatic  Eyes:    PERRL, conjunctiva/corneas clear, EOM's intact, fundi    benign, both eyes  Ears:    Normal TM's and external ear canals, both ears  Nose:   Nares normal, septum midline, mucosa normal, no drainage    or sinus tenderness  Throat:   Lips, mucosa, and tongue normal; teeth and gums normal  Neck:   Supple, symmetrical, trachea midline, no adenopathy;    thyroid:  no enlargement/tenderness/nodules; no carotid   bruit or JVD  Back:     Symmetric, no curvature, ROM normal, no CVA tenderness  Lungs:     Clear to auscultation bilaterally, respirations unlabored  Chest Wall:    No tenderness or deformity   Heart:    Regular rate and rhythm, S1 and S2 normal, no murmur, rub   or gallop  Breast Exam:    No tenderness, masses, or nipple abnormality  Abdomen:     Soft, non-tender, bowel sounds active all four quadrants,    no masses, no organomegaly  Genitalia:    Normal female without lesion, discharge or tenderness  Rectal:    Normal tone, no masses or tenderness;   guaiac negative stool  Extremities:   Extremities normal, atraumatic, no cyanosis or edema  Pulses:   2+ and symmetric all extremities  Skin:   Skin color, texture, turgor normal, no rashes or lesions  Lymph nodes:   Cervical, supraclavicular, and axillary nodes normal  Neurologic:   CNII-XII intact, normal strength, sensation and reflexes    throughout    Musculoskeletal:  ROM 0-120, Ligaments meniscus tear,  Imaging Review Plain radiographs demonstrate mild degenerative joint disease of the right knee. The overall alignment is neutral. MRI scan meniscus tear  Assessment/Plan: Meniscus tear right knee  The patient history, physical examination and imaging studies are consistent with meniscus tear of the right knee. The patient has failed conservative treatment.  The clearance notes were reviewed.  After discussion with the patient it was felt that knee arthroscopy was indicated. The  procedure,  risks, and benefits of knee arthroscopy were presented and reviewed. The risks including but not limited to infection, blood clots, vascular injury, stiffness, patella tracking problems complications among others were discussed. The patient acknowledged the explanation, agreed to proceed with the plan.    Donia Ast 06/17/2018, 7:16 AM

## 2018-06-18 ENCOUNTER — Encounter (HOSPITAL_COMMUNITY): Payer: Self-pay | Admitting: Orthopedic Surgery

## 2018-06-18 NOTE — Op Note (Signed)
[facility] KNEE REPLACEMENT OPERATIVE NOTE:  06/17/2018  4:00 PM  PATIENT:  Jennifer [name]  42 y.o. female  PRE-OPERATIVE DIAGNOSIS:  Right Torn Medial Meniscus  POST-OPERATIVE DIAGNOSIS:  Right Torn Medial Meniscus  PROCEDURE:  Procedure(s): RIGHT KNEE ARTHROSCOPY WITH MENISCAL REPAIR  SURGEON:  Surgeon(s): Vickey Huger, MD  ANESTHESIA:   general  SPECIMEN: None  COUNTS:  Correct  TOURNIQUET:  * No tourniquets in log *  DICTATION:  Indication for procedure:    The patient is a 42 y.o. female who has failed conservative treatment for Right Torn Medial Meniscus.  Informed consent was obtained prior to anesthesia. The risks versus benefits of the operation were explain and in a way the patient can, and did, understand.   Description of procedure:     The patient was taken to the operating room and placed under anesthesia.  The patient was positioned in the usual fashion taking care that all body parts were adequately padded and/or protected.  I foley catheter was not placed.  A tourniquet was applied and the leg prepped and draped in the usual sterile fashion.  The extremity was exsanguinated with the esmarch and tourniquet inflated to 350 mmHg.  Pre-operative range of motion was normal.   I created inferomedial and inferolateral portals with a #11 blade, blunt trochar and cannula.  I tour through the knee was carried out.  The PF compartment had moderate arthritis; the medial compartment had moderated to severe with one 1.2cm area of grade 4 OA.  There was a posterior meniscal root tear which I debrided about 25% of the meniscus with the basket and shaver.  The lateral meniscus looked good and there was only minimal arthritis.  The most impressive pathologic finding was incredible synovitis most likely related to her RA.  I debrided much of this, which caused bleeded which needed cauterization.  I then lavaged the knee and closed with 4-0 nylon sutures.  I dressed with xeroform, 4 x 4's,  abd's and an ACE wrap. The patient was awakened, extubated, and taken to recovery in a stable condition.   COMPLICATIONS:  None.  PLAN OF CARE: Discharge to home after PACU  PATIENT DISPOSITION:  PACU - hemodynamically stable.   Delay start of Pharmacological VTE agent (>24hrs) due to surgical blood loss or risk of bleeding:  not applicable  Please fax a copy of this op note to my office at [phone] (please only include page 1 and 2 of the Case Information op note)

## 2018-06-25 ENCOUNTER — Ambulatory Visit (INDEPENDENT_AMBULATORY_CARE_PROVIDER_SITE_OTHER): Payer: Commercial Managed Care - PPO | Admitting: Family Medicine

## 2018-06-25 VITALS — BP 137/84 | HR 80 | Temp 98.0°F | Ht 63.0 in | Wt 291.0 lb

## 2018-06-25 DIAGNOSIS — Z6841 Body Mass Index (BMI) 40.0 and over, adult: Secondary | ICD-10-CM

## 2018-06-25 DIAGNOSIS — E7849 Other hyperlipidemia: Secondary | ICD-10-CM | POA: Diagnosis not present

## 2018-06-25 DIAGNOSIS — E559 Vitamin D deficiency, unspecified: Secondary | ICD-10-CM

## 2018-06-25 DIAGNOSIS — E8881 Metabolic syndrome: Secondary | ICD-10-CM | POA: Diagnosis not present

## 2018-06-25 DIAGNOSIS — I10 Essential (primary) hypertension: Secondary | ICD-10-CM

## 2018-06-25 DIAGNOSIS — Z9189 Other specified personal risk factors, not elsewhere classified: Secondary | ICD-10-CM

## 2018-06-25 MED ORDER — VITAMIN D (ERGOCALCIFEROL) 1.25 MG (50000 UNIT) PO CAPS: 50000.0000 [IU] | ORAL_CAPSULE | ORAL | 0 refills | Status: DC

## 2018-06-26 NOTE — Progress Notes (Signed)
Office: [phone]  /  Fax: [phone]   HPI:   Chief Complaint: OBESITY Maicee is here to discuss her progress with her obesity treatment plan. She is on the Category 2 plan and is following her eating plan approximately 50 % of the time. She states she is exercising 0 minutes 0 times per week. Suda had surgery recently and struggled with her appetite, sleeping habit changes, as well as standing to cook.  Her weight is 291 lb (132 kg) today and had a weight gain of 3 pounds in 2 weeks since her last visit. She has lost 0 lbs since starting treatment with [place].  Vitamin D deficiency Kenyanna has a diagnosis of vitamin D deficiency. She is not currently taking OTC vit D and she admits fatigue.  Insulin Resistance Ileana has a diagnosis of insulin resistance based on her elevated fasting insulin level >5. Although Campbell's blood glucose readings are still under good control, insulin resistance puts her at greater risk of metabolic syndrome and diabetes. She is having occasional carb cravings.  At risk for diabetes Amena is at higher than average risk for developing diabetes due to her insulin resistance and obesity.   Hypertension JIMMYE WISNIESKI is a 42 y.o. female with hypertension. Nury denies chest pain, chest pressure, or headaches. She is working on weight loss to help control her blood pressure with the goal of decreasing her risk of heart attack and stroke. Tonyetta's blood pressure is currently controlled today.  Hyperlipidemia Alexandera has hyperlipidemia and has been trying to improve her cholesterol levels with intensive lifestyle modification including a low saturated fat diet, exercise and weight loss. Her LDL is elevated and she is not on a statin.  ALLERGIES: Allergies  Allergen Reactions  . Lisinopril-Hydrochlorothiazide Swelling and Other (See Comments)    Angioedema  Has tolerated maxide in past so most likely  The ACE inhibitor as the cause     MEDICATIONS: Current  Outpatient Medications on File Prior to Visit  Medication Sig Dispense Refill  . amLODipine (NORVASC) 5 MG tablet Take 1 tablet (5 mg total) by mouth daily. (Patient taking differently: Take 5 mg by mouth at bedtime. ) 30 tablet 3  . Certolizumab Pegol (CIMZIA) 2 X 200 MG KIT Inject 400 mg into the skin every 28 (twenty-eight) days. 1 each 2  . clindamycin-benzoyl peroxide (BENZACLIN) gel Apply 1 application topically every other day.     . diclofenac sodium (VOLTAREN) 1 % GEL Apply three grams to three large joints up to three times daily (Patient taking differently: Apply 3 g topically 2 (two) times daily. ) 3 Tube 3  . fluocinonide ointment (LIDEX) 5.46 % Apply 1 application topically 2 (two) times daily.   5  . iron polysaccharides (FERREX 150) 150 MG capsule Take 1 capsule (150 mg total) by mouth daily. 30 capsule 5  . labetalol (NORMODYNE) 200 MG tablet Take 1 tablet (200 mg total) by mouth daily. (Patient taking differently: Take 200 mg by mouth at bedtime. ) 30 tablet 0  . Melatonin 10 MG CAPS Take 20 mg by mouth at bedtime as needed (for sleep).    . methocarbamol (ROBAXIN) 500 MG tablet Take 500 mg by mouth 2 (two) times daily between meals as needed for muscle spasms.    [name] oxyCODONE (OXY IR/ROXICODONE) 5 MG immediate release tablet Take 1 tablet (5 mg total) by mouth every 4 (four) hours as needed for severe pain. 20 tablet 0  . tretinoin (RETIN-A) 0.025 % gel  Apply 1 application topically at bedtime. At night  5   No current facility-administered medications on file prior to visit.     PAST MEDICAL HISTORY: Past Medical History:  Diagnosis Date  . Acute otitis media 08/28/2012   improved  change to liquid medication   . Anxiety   . Breast abscess of female    Recurrent  . Depression   . Family history of adverse reaction to anesthesia    father hard time waking once (12/20/2016)  . Fibroids    w/bleeding  . GERD (gastroesophageal reflux disease)   . History of blood  transfusion    after surgery  . Hypertension   . Migraines    Dr. Orie Rout; "I have a few/month" (12/20/2016)  . Osteoarthritis   . Pill esophagitis 08/28/2012   amoxicillin  by hx  disc plan nl voice except hoarse not drooling  close fu  if not getting better with plan stop aleve  liquid ibu onlyf necessary   . Recurrent periodic urticaria   . Rheumatoid arthritis ([place])    "55% of my body" (12/20/2016)  . Sleep apnea    wears cpap    PAST SURGICAL HISTORY: Past Surgical History:  Procedure Laterality Date  . BREAST SURGERY     abcess under left breast   . CYST EXCISION Left 2014   "elbow"  . HERNIA REPAIR    . INCISION AND DRAINAGE BREAST ABSCESS Left 2003  . INCISIONAL HERNIA REPAIR N/A 12/18/2016   Procedure: REPAIR INCISIONAL HERNIA WITH MESH;  Surgeon: Georganna Skeans, MD;  Location: [facility];  Service: General;  Laterality: N/A;  . INSERTION OF MESH N/A 12/18/2016   Procedure: INSERTION OF MESH;  Surgeon: Georganna Skeans, MD;  Location: [facility];  Service: General;  Laterality: N/A;  . KNEE ARTHROSCOPY WITH MENISCAL REPAIR Right 06/17/2018   Procedure: RIGHT KNEE ARTHROSCOPY WITH MENISCAL REPAIR;  Surgeon: Vickey Huger, MD;  Location: [facility];  Service: Orthopedics;  Laterality: Right;  . MYOMECTOMY N/A 09/06/2016   Procedure: ABOMINAL MYOMECTOMY;  Surgeon: Governor Specking, MD;  Location: [facility];  Service: Gynecology;  Laterality: N/A;  . UTERINE FIBROID SURGERY     34 fibroids    SOCIAL HISTORY: Social History   Tobacco Use  . Smoking status: Never Smoker  . Smokeless tobacco: Never Used  Substance Use Topics  . Alcohol use: Yes    Comment: social  . Drug use: Never    FAMILY HISTORY: Family History  Problem Relation Age of Onset  . Hypertension Mother   . Rheum arthritis Mother   . Hypertension Father   . Sleep apnea Father     ROS: Review of Systems  Constitutional: Positive for malaise/fatigue. Negative for weight loss.  Cardiovascular: Negative for chest  pain.       Negative for chest pressure.  Neurological: Negative for headaches.  Endo/Heme/Allergies:       Positive for carb cravings.    PHYSICAL EXAM: Blood pressure 137/84, pulse 80, temperature 98 F (36.7 C), temperature source Oral, height _0  (1.6 m), weight 291 lb (132 kg), last menstrual period 06/02/2018, SpO2 98 %. Body mass index is 51.55 kg/m. Physical Exam  Constitutional: She is oriented to person, place, and time. She appears well-developed and well-nourished.  Cardiovascular: Normal rate.  Pulmonary/Chest: Effort normal.  Musculoskeletal: Normal range of motion.  Neurological: She is oriented to person, place, and time.  Skin: Skin is warm and dry.  Psychiatric: She has a normal mood and  affect. Her behavior is normal.  Vitals reviewed.   RECENT LABS AND TESTS: BMET    Component Value Date/Time   NA 138 06/11/2018 1212   K 3.9 06/11/2018 1212   CL 99 06/11/2018 1212   CO2 24 06/11/2018 1212   GLUCOSE 92 06/11/2018 1212   GLUCOSE 90 01/28/2018 1503   BUN 9 06/11/2018 1212   CREATININE 0.85 06/11/2018 1212   CREATININE 0.99 01/28/2018 1503   CALCIUM 8.8 06/11/2018 1212   GFRNONAA 85 06/11/2018 1212   GFRNONAA 70 01/28/2018 1503   GFRAA 98 06/11/2018 1212   GFRAA 81 01/28/2018 1503   Lab Results  Component Value Date   HGBA1C 5.6 06/11/2018   Lab Results  Component Value Date   INSULIN 10.1 06/11/2018   CBC    Component Value Date/Time   WBC 5.1 06/11/2018 1212   WBC 6.3 01/28/2018 1503   RBC 3.94 06/11/2018 1212   RBC 3.78 (L) 01/28/2018 1503   HGB 11.9 06/11/2018 1212   HCT 36.6 06/11/2018 1212   PLT 310 06/11/2018 1212   MCV 93 06/11/2018 1212   MCH 30.2 06/11/2018 1212   MCH 31.0 01/28/2018 1503   MCHC 32.5 06/11/2018 1212   MCHC 34.3 01/28/2018 1503   RDW 14.4 06/11/2018 1212   LYMPHSABS 2.3 06/11/2018 1212   MONOABS 928 03/07/2017 1632   EOSABS 0.2 06/11/2018 1212   BASOSABS 0.0 06/11/2018 1212   Iron/TIBC/Ferritin/  %Sat No results found for: IRON, TIBC, FERRITIN, IRONPCTSAT Lipid Panel     Component Value Date/Time   CHOL 189 06/11/2018 1212   TRIG 52 06/11/2018 1212   HDL 71 06/11/2018 1212   CHOLHDL 2.7 06/11/2018 1212   LDLCALC 108 (H) 06/11/2018 1212   Hepatic Function Panel     Component Value Date/Time   PROT 8.0 06/11/2018 1212   ALBUMIN 3.8 06/11/2018 1212   AST 11 06/11/2018 1212   ALT 9 06/11/2018 1212   ALKPHOS 69 06/11/2018 1212   BILITOT 0.3 06/11/2018 1212   BILIDIR 0.2 10/29/2009 0835      Component Value Date/Time   TSH 1.950 06/11/2018 1212  Results for GABRIEL, PAULDING (MRN [id number]) as of 06/26/2018 15:32  Ref. Range 06/11/2018 12:12  Vitamin D, 25-Hydroxy Latest Ref Range: 30.0 - 100.0 ng/mL 18.9 (L)    ASSESSMENT AND PLAN: Vitamin D deficiency - Plan: Vitamin D, Ergocalciferol, (DRISDOL) 50000 units CAPS capsule  Insulin resistance  Essential hypertension  Other hyperlipidemia  At risk for diabetes mellitus  Class 3 severe obesity with serious comorbidity and body mass index (BMI) of 50.0 to 59.9 in adult, unspecified obesity type ([place])  PLAN:  Vitamin D Deficiency Kattleya was informed that low vitamin D levels contributes to fatigue and are associated with obesity, breast, and colon cancer. She agrees to continue to take prescription Vit D _0 ,000 IU every week #4 with no refills and will follow up for routine testing of vitamin D, at least 2-3 times per year. She was informed of the risk of over-replacement of vitamin D and agrees to not increase her dose unless she discusses this with [place] first. She will follow up in 2 weeks.  Insulin Resistance Kemiya will continue to work on weight loss, exercise, and decreasing simple carbohydrates in her diet to help decrease the risk of diabetes.She was informed that eating too many simple carbohydrates or too many calories at one sitting increases the likelihood of GI side effects. We will repeat a Hgb A1c and Insulin in  3 months. Chaniece agreed to follow up with [place] as directed to monitor her progress.  Diabetes risk counseling Myosha was given extended (30 minutes) diabetes prevention counseling today. She is 42 y.o. female and has risk factors for diabetes including insulin resistance and obesity. We discussed intensive lifestyle modifications today with an emphasis on weight loss as well as increasing exercise and decreasing simple carbohydrates in her diet.  Hypertension We discussed sodium restriction, working on healthy weight loss, and a regular exercise program as the means to achieve improved blood pressure control. Waver agreed with this plan and agreed to follow up as directed. We will continue to monitor her blood pressure as well as herprogress with the above lifestyle modifications. She will continue her amlodipine as prescribed and will watch for signs of hypotension as she continues her lifestyle modifications.  Hyperlipidemia Linzee was informed of the American Heart Association Guidelines emphasizing intensive lifestyle modifications as the first line treatment for hyperlipidemia. We discussed many lifestyle modifications today in depth, and Olene will continue to work on decreasing saturated fats such as fatty red meat, butter and many fried foods. She will also increase vegetables and lean protein in her diet and continue to work on exercise and weight loss efforts. We will follow up FLP in 3 months. Mayeli agrees to return for an office visit in 2 weeks.  Obesity Shanena is currently in the action stage of change. As such, her goal is to continue with weight loss efforts She has agreed to follow the Category 2 plan + 100 calories. [place] has been instructed to work up to a goal of 150 minutes of combined cardio and strengthening exercise per week for weight loss and overall health benefits. We discussed the following Behavioral Modification Strategies today: increasing lean protein intake, increasing  vegetables, work on meal planning and easy cooking plans, and planning for success.  Ilynn has agreed to follow up with our clinic in 2 weeks. She was informed of the importance of frequent follow up visits to maximize her success with intensive lifestyle modifications for her multiple health conditions.   OBESITY BEHAVIORAL INTERVENTION VISIT  Today's visit was # 2   Starting weight: 288 lbs Starting date: 06/11/18 Today's weight : Weight: 291 lb (132 kg)  Today's date: 06/25/2018 Total lbs lost to date: 0  ASK: We discussed the diagnosis of obesity with [name] today and Aleane agreed to give [place] permission to discuss obesity behavioral modification therapy today.  ASSESS: Marney has the diagnosis of obesity and her BMI today is 51.56 Shere is in the action stage of change.   ADVISE: Beretta was educated on the multiple health risks of obesity as well as the benefit of weight loss to improve her health. She was advised of the need for long term treatment and the importance of lifestyle modifications to improve her current health and to decrease her risk of future health problems.  AGREE: Multiple dietary modification options and treatment options were discussed and Rozell agreed to follow the recommendations documented in the above note.  ARRANGE: Julyssa was educated on the importance of frequent visits to treat obesity as outlined per CMS and USPSTF guidelines and agreed to schedule her next follow up appointment today.  I, Marcille Blanco, am acting as Location manager for Eber Jones, MD  I have reviewed the above documentation for accuracy and completeness, and I agree with the above. - Ilene Qua, MD

## 2018-06-27 ENCOUNTER — Telehealth: Payer: Self-pay | Admitting: Rheumatology

## 2018-06-27 ENCOUNTER — Telehealth: Payer: Self-pay | Admitting: Pharmacy Technician

## 2018-06-27 NOTE — Telephone Encounter (Signed)
Received a fax confirming a Cimzia shipment from [facility] to deliver to the office on 07/02/18. For administration date of 07/03/18.  Will send document to scan center.  4:24 PM Beatriz Chancellor, CPhT

## 2018-06-27 NOTE — Telephone Encounter (Signed)
Able to view labs in Epic. Will schedule Appointment once medication arrives in office.

## 2018-06-27 NOTE — Telephone Encounter (Signed)
Patient called stating she had her labwork on 06/11/18 with Dr. Adair Patter and is going to order her Cimzia.  Patient states she will wait to hear from you to schedule an appointment.

## 2018-07-02 NOTE — Telephone Encounter (Signed)
Patient has been scheduled for Cimzia injection 07/03/18 at 10 am.

## 2018-07-02 NOTE — Telephone Encounter (Signed)
Cimzia has been received and placed in fridge.  9:04 AM Beatriz Chancellor, CPhT

## 2018-07-03 ENCOUNTER — Ambulatory Visit (INDEPENDENT_AMBULATORY_CARE_PROVIDER_SITE_OTHER): Payer: Commercial Managed Care - PPO | Admitting: *Deleted

## 2018-07-03 DIAGNOSIS — M0579 Rheumatoid arthritis with rheumatoid factor of multiple sites without organ or systems involvement: Secondary | ICD-10-CM

## 2018-07-03 MED ORDER — CERTOLIZUMAB PEGOL 2 X 200 MG ~~LOC~~ KIT
400.0000 mg | PACK | Freq: Once | SUBCUTANEOUS | Status: AC
  Administered 2018-07-03: 400 mg via SUBCUTANEOUS

## 2018-07-03 NOTE — Progress Notes (Signed)
Patient in office for a Cimzia injection. Patient denies fever or infection. Patient was given injections in her right and left thigh. Patient tolerated injections well and will return in 1 month for next injection.   Administrations This Visit    Certolizumab Pegol KIT 400 mg    Admin Date 07/03/2018 Action Given Dose 400 mg Route Subcutaneous Administered By Carole Binning, LPN

## 2018-07-08 ENCOUNTER — Other Ambulatory Visit: Payer: Self-pay | Admitting: Rheumatology

## 2018-07-08 NOTE — Telephone Encounter (Signed)
Please schedule patient for follow up visit. Patient due November 2019. Thanks !

## 2018-07-08 NOTE — Telephone Encounter (Signed)
Last Visit: 03/05/18 Next visit due November 2019. Message sent to front to schedule.  Labs: 06/11/18 cbc/cmp WNL Tb Gold: 01/28/18 Neg   Okay to refill per Dr. Estanislado Pandy

## 2018-07-11 ENCOUNTER — Ambulatory Visit (INDEPENDENT_AMBULATORY_CARE_PROVIDER_SITE_OTHER): Payer: Commercial Managed Care - PPO | Admitting: Family Medicine

## 2018-07-11 VITALS — BP 140/86 | HR 74 | Temp 97.7°F | Ht 63.0 in | Wt 289.0 lb

## 2018-07-11 DIAGNOSIS — Z6841 Body Mass Index (BMI) 40.0 and over, adult: Secondary | ICD-10-CM

## 2018-07-11 DIAGNOSIS — I10 Essential (primary) hypertension: Secondary | ICD-10-CM

## 2018-07-11 DIAGNOSIS — E559 Vitamin D deficiency, unspecified: Secondary | ICD-10-CM

## 2018-07-15 NOTE — Progress Notes (Signed)
Office: [phone]  /  Fax: [phone]   HPI:   Chief Complaint: OBESITY Jennifer [name] is here to discuss Jennifer [name] progress with Jennifer [name] obesity treatment plan. Jennifer [name] is on the  follow the Category 2 plan +100 calories and is following Jennifer [name] eating plan approximately 100 % of the time. Jennifer [name] states Jennifer [name] is exercising 0 minutes 0 times per week. Jennifer [name] felt some of the days Jennifer [name] followed the plan 100% Jennifer [name] was not hungry. Jennifer [name] is getting better in PT.   Jennifer [name] weight is 289 lb (131.1 kg) today and has had a weight loss of 2 pounds over a period of 2 weeks since Jennifer [name] last visit. Jennifer [name] has lost 1 lb since starting treatment with Jennifer [name].  Hypertension Jennifer [name] is a 42 y.o. female with hypertension.  Jennifer [name] denies chest pain/[name] or shortness of breath on exertion. Jennifer [name] is working weight loss to help control Jennifer [name] blood [name] with the goal of decreasing Jennifer [name] risk of heart attack and stroke. Jennifer [name] is slightly elevated.   Vitamin D deficiency Jennifer [name] has a diagnosis of vitamin D deficiency. Jennifer [name] is currently taking vit D and denies nausea, vomiting or muscle weakness. Jennifer [name] reports fatigue.   Ref. Range 06/11/2018 12:12  Vitamin D, 25-Hydroxy Latest Ref Range: 30.0 - 100.0 ng/mL 18.9 (L)    ALLERGIES: Allergies  Allergen Reactions  . Lisinopril-Hydrochlorothiazide Swelling and Other (See Comments)    Angioedema  Has tolerated maxide in past so most likely  The ACE inhibitor as the cause     MEDICATIONS: Current Outpatient Medications on File Prior to Visit  Medication Sig Dispense Refill  . amLODipine (NORVASC) 5 MG tablet Take 1 tablet (5 mg total) by mouth daily. (Patient taking differently: Take 5 mg by mouth at bedtime. ) 30 tablet 3  . CIMZIA 2 X 200 MG KIT INJECT 400 MG UNDER THE SKIN EVERY 28 (TWENTY-EIGHT) DAYS 1 each 2  . clindamycin-benzoyl peroxide (BENZACLIN) gel Apply 1 application topically every other day.     . diclofenac sodium (VOLTAREN) 1 % GEL Apply three grams to three  large joints up to three times daily (Patient taking differently: Apply 3 g topically 2 (two) times daily. ) 3 Tube 3  . fluocinonide ointment (LIDEX) 4.23 % Apply 1 application topically 2 (two) times daily.   5  . iron polysaccharides (FERREX 150) 150 MG capsule Take 1 capsule (150 mg total) by mouth daily. 30 capsule 5  . labetalol (NORMODYNE) 200 MG tablet Take 1 tablet (200 mg total) by mouth daily. (Patient taking differently: Take 200 mg by mouth at bedtime. ) 30 tablet 0  . Melatonin 10 MG CAPS Take 20 mg by mouth at bedtime as needed (for sleep).    . methocarbamol (ROBAXIN) 500 MG tablet Take 500 mg by mouth 2 (two) times daily between meals as needed for muscle spasms.    [name] oxyCODONE (OXY IR/ROXICODONE) 5 MG immediate release tablet Take 1 tablet (5 mg total) by mouth every 4 (four) hours as needed for severe pain. 20 tablet 0  . tretinoin (RETIN-A) 0.025 % gel Apply 1 application topically at bedtime. At night  5  . Vitamin D, Ergocalciferol, (DRISDOL) 50000 units CAPS capsule Take 1 capsule (50,000 Units total) by mouth every 7 (seven) days. 4 capsule 0   No current facility-administered medications on file prior to visit.     PAST MEDICAL HISTORY: Past Medical History:  Diagnosis Date  . Acute otitis media 08/28/2012   improved  change to liquid medication   . Anxiety   . Breast abscess of female    Recurrent  . Depression   . Family history of adverse reaction to anesthesia    father hard time waking once (12/20/2016)  . Fibroids    w/bleeding  . GERD (gastroesophageal reflux disease)   . History of blood transfusion    after surgery  . Hypertension   . Migraines    Dr. Orie Rout; "I have a few/month" (12/20/2016)  . Osteoarthritis   . Pill esophagitis 08/28/2012   amoxicillin  by hx  disc plan nl voice except hoarse not drooling  close fu  if not getting better with plan stop aleve  liquid ibu onlyf necessary   . Recurrent periodic urticaria   . Rheumatoid  arthritis ([place])    "55% of my body" (12/20/2016)  . Sleep apnea    wears cpap    PAST SURGICAL HISTORY: Past Surgical History:  Procedure Laterality Date  . BREAST SURGERY     abcess under left breast   . CYST EXCISION Left 2014   "elbow"  . HERNIA REPAIR    . INCISION AND DRAINAGE BREAST ABSCESS Left 2003  . INCISIONAL HERNIA REPAIR N/A 12/18/2016   Procedure: REPAIR INCISIONAL HERNIA WITH MESH;  Surgeon: Georganna Skeans, MD;  Location: [facility];  Service: General;  Laterality: N/A;  . INSERTION OF MESH N/A 12/18/2016   Procedure: INSERTION OF MESH;  Surgeon: Georganna Skeans, MD;  Location: [facility];  Service: General;  Laterality: N/A;  . KNEE ARTHROSCOPY WITH MENISCAL REPAIR Right 06/17/2018   Procedure: RIGHT KNEE ARTHROSCOPY WITH MENISCAL REPAIR;  Surgeon: Vickey Huger, MD;  Location: [facility];  Service: Orthopedics;  Laterality: Right;  . MYOMECTOMY N/A 09/06/2016   Procedure: ABOMINAL MYOMECTOMY;  Surgeon: Governor Specking, MD;  Location: [facility];  Service: Gynecology;  Laterality: N/A;  . UTERINE FIBROID SURGERY     34 fibroids    SOCIAL HISTORY: Social History   Tobacco Use  . Smoking status: Never Smoker  . Smokeless tobacco: Never Used  Substance Use Topics  . Alcohol use: Yes    Comment: social  . Drug use: Never    FAMILY HISTORY: Family History  Problem Relation Age of Onset  . Hypertension Mother   . Rheum arthritis Mother   . Hypertension Father   . Sleep apnea Father     ROS: Review of Systems  Constitutional: Positive for malaise/fatigue and weight loss.  Respiratory: Negative for shortness of breath.   Cardiovascular: Negative for chest pain.       Negative for chest [name]   Gastrointestinal: Negative for nausea and vomiting.  Musculoskeletal:       Negative for muscle weakness    PHYSICAL EXAM: Blood [name] 140/86, pulse 74, temperature 97.7 F (36.5 C), temperature source Oral, height 5' 3" (1.6 m), weight 289 lb (131.1 kg), SpO2 96 %. Body mass  index is 51.19 kg/m. Physical Exam  Constitutional: Jennifer [name] is oriented to person, place, and time. Jennifer [name] appears well-developed and well-nourished.  HENT:  Head: Normocephalic.  Neck: Normal range of motion.  Cardiovascular: Normal rate.  Pulmonary/Chest: Effort normal.  Musculoskeletal: Normal range of motion.  Neurological: Jennifer [name] is alert and oriented to person, place, and time.  Skin: Skin is warm and dry.  Psychiatric: Jennifer [name] has a normal mood and affect. Jennifer [name] behavior is normal.  Vitals reviewed.   RECENT LABS AND TESTS: BMET    Component Value Date/Time   NA 138  06/11/2018 1212   K 3.9 06/11/2018 1212   CL 99 06/11/2018 1212   CO2 24 06/11/2018 1212   GLUCOSE 92 06/11/2018 1212   GLUCOSE 90 01/28/2018 1503   BUN 9 06/11/2018 1212   CREATININE 0.85 06/11/2018 1212   CREATININE 0.99 01/28/2018 1503   CALCIUM 8.8 06/11/2018 1212   GFRNONAA 85 06/11/2018 1212   GFRNONAA 70 01/28/2018 1503   GFRAA 98 06/11/2018 1212   GFRAA 81 01/28/2018 1503   Lab Results  Component Value Date   HGBA1C 5.6 06/11/2018   Lab Results  Component Value Date   INSULIN 10.1 06/11/2018   CBC    Component Value Date/Time   WBC 5.1 06/11/2018 1212   WBC 6.3 01/28/2018 1503   RBC 3.94 06/11/2018 1212   RBC 3.78 (L) 01/28/2018 1503   HGB 11.9 06/11/2018 1212   HCT 36.6 06/11/2018 1212   PLT 310 06/11/2018 1212   MCV 93 06/11/2018 1212   MCH 30.2 06/11/2018 1212   MCH 31.0 01/28/2018 1503   MCHC 32.5 06/11/2018 1212   MCHC 34.3 01/28/2018 1503   RDW 14.4 06/11/2018 1212   LYMPHSABS 2.3 06/11/2018 1212   MONOABS 928 03/07/2017 1632   EOSABS 0.2 06/11/2018 1212   BASOSABS 0.0 06/11/2018 1212   Iron/TIBC/Ferritin/ %Sat No results found for: IRON, TIBC, FERRITIN, IRONPCTSAT Lipid Panel     Component Value Date/Time   CHOL 189 06/11/2018 1212   TRIG 52 06/11/2018 1212   HDL 71 06/11/2018 1212   CHOLHDL 2.7 06/11/2018 1212   LDLCALC 108 (H) 06/11/2018 1212   Hepatic Function Panel       Component Value Date/Time   PROT 8.0 06/11/2018 1212   ALBUMIN 3.8 06/11/2018 1212   AST 11 06/11/2018 1212   ALT 9 06/11/2018 1212   ALKPHOS 69 06/11/2018 1212   BILITOT 0.3 06/11/2018 1212   BILIDIR 0.2 10/29/2009 0835      Component Value Date/Time   TSH 1.950 06/11/2018 1212    Ref. Range 06/11/2018 12:12  Vitamin D, 25-Hydroxy Latest Ref Range: 30.0 - 100.0 ng/mL 18.9 (L)    ASSESSMENT AND PLAN: Essential hypertension  Vitamin D deficiency  Class 3 severe obesity with serious comorbidity and body mass index (BMI) of 50.0 to 59.9 in adult, unspecified obesity type (HCC)  PLAN: Hypertension We discussed sodium restriction, working on healthy weight loss, and a regular exercise program as the means to achieve improved blood [name] control. Jennifer [name] agreed with this plan and agreed to follow up as directed. We will continue to monitor Jennifer [name] blood [name] as well as Jennifer [name] progress with the above lifestyle modifications. Jennifer [name] will continue Jennifer [name] medications as prescribed and will watch for signs of hypotension as Jennifer [name] continues Jennifer [name] lifestyle modifications. Agrees to follow up with our clinic as directed.   Vitamin D Deficiency Jennifer [name] was informed that low vitamin D levels contributes to fatigue and are associated with obesity, breast, and colon cancer. Jennifer [name] agrees to continue to take prescription Vit D _0 ,000 IU every week and will follow up for routine testing of vitamin D, at least 2-3 times per year. Jennifer [name] was informed of the risk of over-replacement of vitamin D and agrees to not increase Jennifer [name] dose unless Jennifer [name] discusses this with Jennifer [name] first. Agrees to follow up with our clinic as directed.   I spent > than 50% of the 15 minute visit on counseling as documented in the note.  Obesity Jennifer [name] is currently in the action stage of change. As such,  Jennifer [name] goal is to continue with weight loss efforts Jennifer [name] has agreed to follow the Category 2 plan +100 calories Jennifer [name] has been instructed to work up to a  goal of 150 minutes of combined cardio and strengthening exercise per week for weight loss and overall health benefits. We discussed the following Behavioral Modification Strategies today: increasing lean protein intake, increasing vegetables, planning for success, and work on meal planning and easy cooking plans.  Jennifer [name] has agreed to follow up with our clinic in 2 weeks. Jennifer [name] was informed of the importance of frequent follow up visits to maximize Jennifer [name] success with intensive lifestyle modifications for Jennifer [name] multiple health conditions.   OBESITY BEHAVIORAL INTERVENTION VISIT  Today's visit was # 3   Starting weight: 288 lb Starting date: 06/11/18 Today's weight : 289 lb Today's date: 07/11/18 Total lbs lost to date: 1 lb At least 15 minutes were spent on discussing the following behavioral intervention visit.   ASK: We discussed the diagnosis of obesity with Jennifer [name] today and Jennifer [name] agreed to give Jennifer [name] permission to discuss obesity behavioral modification therapy today.  ASSESS: Jennifer [name] has the diagnosis of obesity and Jennifer [name] BMI today is 51.21 Jennifer [name] is in the action stage of change   ADVISE: Jennifer [name] was educated on the multiple health risks of obesity as well as the benefit of weight loss to improve Jennifer [name] health. Jennifer [name] was advised of the need for long term treatment and the importance of lifestyle modifications to improve Jennifer [name] current health and to decrease Jennifer [name] risk of future health problems.  AGREE: Multiple dietary modification options and treatment options were discussed and  Jennifer [name] agreed to follow the recommendations documented in the above note.  ARRANGE: Jennifer [name] was educated on the importance of frequent visits to treat obesity as outlined per CMS and USPSTF guidelines and agreed to schedule Jennifer [name] next follow up appointment today.  I, Renee Ramus, am acting as transcriptionist for Ilene Qua, MD   I have reviewed the above documentation for accuracy and completeness, and I agree  with the above. - Ilene Qua, MD

## 2018-07-23 DIAGNOSIS — Z9889 Other specified postprocedural states: Secondary | ICD-10-CM | POA: Insufficient documentation

## 2018-07-24 ENCOUNTER — Ambulatory Visit (INDEPENDENT_AMBULATORY_CARE_PROVIDER_SITE_OTHER): Payer: Commercial Managed Care - PPO | Admitting: Family Medicine

## 2018-07-24 VITALS — BP 142/85 | HR 82 | Temp 97.5°F | Ht 63.0 in | Wt 286.0 lb

## 2018-07-24 DIAGNOSIS — E559 Vitamin D deficiency, unspecified: Secondary | ICD-10-CM

## 2018-07-24 DIAGNOSIS — Z9189 Other specified personal risk factors, not elsewhere classified: Secondary | ICD-10-CM

## 2018-07-24 DIAGNOSIS — I10 Essential (primary) hypertension: Secondary | ICD-10-CM | POA: Diagnosis not present

## 2018-07-24 DIAGNOSIS — Z6841 Body Mass Index (BMI) 40.0 and over, adult: Secondary | ICD-10-CM

## 2018-07-25 NOTE — Progress Notes (Signed)
Office: [phone]  /  Fax: [phone]   HPI:   Chief Complaint: OBESITY Jennifer [name] is here to discuss her progress with her obesity treatment plan. She is on the Category 2 plan + 100 calories and is following her eating plan approximately 90 % of the time. She states she is exercising 0 minutes 0 times per week. Jennifer [name] has made a chart of what she needs to eat daily. She has most of the food in the house but she is trying [employer]. She has the rest of the week prepped. She has no current plans for Thanksgiving and she is doing all the food at all meals.  Her weight is 286 lb (129.7 kg) today and has had a weight loss of 3 pounds over a period of 2 weeks since her last visit. She has lost 2 lbs since starting treatment with [place].  Hypertension Jennifer [name] is a 42 y.o. female with hypertension. Jennifer [name]'s blood pressure is slightly elevated but she is in pain. She states she has taken her medications. She denies chest pain. She is working weight loss to help control her blood pressure with the goal of decreasing her risk of heart attack and stroke. Jennifer [name]'s blood pressure is not currently controlled.  Vitamin D Deficiency Jennifer [name] has a diagnosis of vitamin D deficiency. She is currently taking prescription Vit D. She notes fatigue and denies nausea, vomiting or muscle weakness.  At risk for osteopenia and osteoporosis Jennifer [name] is at higher risk of osteopenia and osteoporosis due to vitamin D deficiency.   ALLERGIES: Allergies  Allergen Reactions  . Lisinopril-Hydrochlorothiazide Swelling and Other (See Comments)    Angioedema  Has tolerated maxide in past so most likely  The ACE inhibitor as the cause     MEDICATIONS: Current Outpatient Medications on File Prior to Visit  Medication Sig Dispense Refill  . amLODipine (NORVASC) 5 MG tablet Take 1 tablet (5 mg total) by mouth daily. (Patient taking differently: Take 5 mg by mouth at bedtime. ) 30 tablet 3  . CIMZIA 2 X 200 MG KIT INJECT 400 MG  UNDER THE SKIN EVERY 28 (TWENTY-EIGHT) DAYS 1 each 2  . clindamycin-benzoyl peroxide (BENZACLIN) gel Apply 1 application topically every other day.     . diclofenac sodium (VOLTAREN) 1 % GEL Apply three grams to three large joints up to three times daily (Patient taking differently: Apply 3 g topically 2 (two) times daily. ) 3 Tube 3  . fluocinonide ointment (LIDEX) 2.45 % Apply 1 application topically 2 (two) times daily.   5  . iron polysaccharides (FERREX 150) 150 MG capsule Take 1 capsule (150 mg total) by mouth daily. 30 capsule 5  . labetalol (NORMODYNE) 200 MG tablet Take 1 tablet (200 mg total) by mouth daily. (Patient taking differently: Take 200 mg by mouth at bedtime. ) 30 tablet 0  . Melatonin 10 MG CAPS Take 20 mg by mouth at bedtime as needed (for sleep).    . methocarbamol (ROBAXIN) 500 MG tablet Take 500 mg by mouth 2 (two) times daily between meals as needed for muscle spasms.    [name] oxyCODONE (OXY IR/ROXICODONE) 5 MG immediate release tablet Take 1 tablet (5 mg total) by mouth every 4 (four) hours as needed for severe pain. 20 tablet 0  . tretinoin (RETIN-A) 0.025 % gel Apply 1 application topically at bedtime. At night  5  . Vitamin D, Ergocalciferol, (DRISDOL) 50000 units CAPS capsule Take 1 capsule (50,000 Units total) by mouth every 7 (  seven) days. 4 capsule 0   No current facility-administered medications on file prior to visit.     PAST MEDICAL HISTORY: Past Medical History:  Diagnosis Date  . Acute otitis media 08/28/2012   improved  change to liquid medication   . Anxiety   . Breast abscess of female    Recurrent  . Depression   . Family history of adverse reaction to anesthesia    father hard time waking once (12/20/2016)  . Fibroids    w/bleeding  . GERD (gastroesophageal reflux disease)   . History of blood transfusion    after surgery  . Hypertension   . Migraines    Dr. Orie Rout; "I have a few/month" (12/20/2016)  . Osteoarthritis   . Pill  esophagitis 08/28/2012   amoxicillin  by hx  disc plan nl voice except hoarse not drooling  close fu  if not getting better with plan stop aleve  liquid ibu onlyf necessary   . Recurrent periodic urticaria   . Rheumatoid arthritis ([place])    "55% of my body" (12/20/2016)  . Sleep apnea    wears cpap    PAST SURGICAL HISTORY: Past Surgical History:  Procedure Laterality Date  . BREAST SURGERY     abcess under left breast   . CYST EXCISION Left 2014   "elbow"  . HERNIA REPAIR    . INCISION AND DRAINAGE BREAST ABSCESS Left 2003  . INCISIONAL HERNIA REPAIR N/A 12/18/2016   Procedure: REPAIR INCISIONAL HERNIA WITH MESH;  Surgeon: Georganna Skeans, MD;  Location: [facility];  Service: General;  Laterality: N/A;  . INSERTION OF MESH N/A 12/18/2016   Procedure: INSERTION OF MESH;  Surgeon: Georganna Skeans, MD;  Location: [facility];  Service: General;  Laterality: N/A;  . KNEE ARTHROSCOPY WITH MENISCAL REPAIR Right 06/17/2018   Procedure: RIGHT KNEE ARTHROSCOPY WITH MENISCAL REPAIR;  Surgeon: Vickey Huger, MD;  Location: [facility];  Service: Orthopedics;  Laterality: Right;  . MYOMECTOMY N/A 09/06/2016   Procedure: ABOMINAL MYOMECTOMY;  Surgeon: Governor Specking, MD;  Location: [facility];  Service: Gynecology;  Laterality: N/A;  . UTERINE FIBROID SURGERY     34 fibroids    SOCIAL HISTORY: Social History   Tobacco Use  . Smoking status: Never Smoker  . Smokeless tobacco: Never Used  Substance Use Topics  . Alcohol use: Yes    Comment: social  . Drug use: Never    FAMILY HISTORY: Family History  Problem Relation Age of Onset  . Hypertension Mother   . Rheum arthritis Mother   . Hypertension Father   . Sleep apnea Father     ROS: Review of Systems  Constitutional: Positive for malaise/fatigue and weight loss.  Cardiovascular: Negative for chest pain.  Gastrointestinal: Negative for nausea and vomiting.  Musculoskeletal:       Negative muscle weakness    PHYSICAL EXAM: Blood pressure (!) 142/85,  pulse 82, temperature (!) 97.5 F (36.4 C), temperature source Oral, height _0  (1.6 m), weight 286 lb (129.7 kg), SpO2 94 %. Body mass index is 50.66 kg/m. Physical Exam  Constitutional: She is oriented to person, place, and time. She appears well-developed and well-nourished.  Cardiovascular: Normal rate.  Pulmonary/Chest: Effort normal.  Musculoskeletal: Normal range of motion.  Neurological: She is oriented to person, place, and time.  Skin: Skin is warm and dry.  Psychiatric: She has a normal mood and affect. Her behavior is normal.  Vitals reviewed.   RECENT LABS AND TESTS: BMET  Component Value Date/Time   NA 138 06/11/2018 1212   K 3.9 06/11/2018 1212   CL 99 06/11/2018 1212   CO2 24 06/11/2018 1212   GLUCOSE 92 06/11/2018 1212   GLUCOSE 90 01/28/2018 1503   BUN 9 06/11/2018 1212   CREATININE 0.85 06/11/2018 1212   CREATININE 0.99 01/28/2018 1503   CALCIUM 8.8 06/11/2018 1212   GFRNONAA 85 06/11/2018 1212   GFRNONAA 70 01/28/2018 1503   GFRAA 98 06/11/2018 1212   GFRAA 81 01/28/2018 1503   Lab Results  Component Value Date   HGBA1C 5.6 06/11/2018   Lab Results  Component Value Date   INSULIN 10.1 06/11/2018   CBC    Component Value Date/Time   WBC 5.1 06/11/2018 1212   WBC 6.3 01/28/2018 1503   RBC 3.94 06/11/2018 1212   RBC 3.78 (L) 01/28/2018 1503   HGB 11.9 06/11/2018 1212   HCT 36.6 06/11/2018 1212   PLT 310 06/11/2018 1212   MCV 93 06/11/2018 1212   MCH 30.2 06/11/2018 1212   MCH 31.0 01/28/2018 1503   MCHC 32.5 06/11/2018 1212   MCHC 34.3 01/28/2018 1503   RDW 14.4 06/11/2018 1212   LYMPHSABS 2.3 06/11/2018 1212   MONOABS 928 03/07/2017 1632   EOSABS 0.2 06/11/2018 1212   BASOSABS 0.0 06/11/2018 1212   Iron/TIBC/Ferritin/ %Sat No results found for: IRON, TIBC, FERRITIN, IRONPCTSAT Lipid Panel     Component Value Date/Time   CHOL 189 06/11/2018 1212   TRIG 52 06/11/2018 1212   HDL 71 06/11/2018 1212   CHOLHDL 2.7 06/11/2018 1212     LDLCALC 108 (H) 06/11/2018 1212   Hepatic Function Panel     Component Value Date/Time   PROT 8.0 06/11/2018 1212   ALBUMIN 3.8 06/11/2018 1212   AST 11 06/11/2018 1212   ALT 9 06/11/2018 1212   ALKPHOS 69 06/11/2018 1212   BILITOT 0.3 06/11/2018 1212   BILIDIR 0.2 10/29/2009 0835      Component Value Date/Time   TSH 1.950 06/11/2018 1212  Results for KARRYN, KOSINSKI (MRN [id number]) as of 07/25/2018 12:59  Ref. Range 06/11/2018 12:12  Vitamin D, 25-Hydroxy Latest Ref Range: 30.0 - 100.0 ng/mL 18.9 (L)    ASSESSMENT AND PLAN: Vitamin D deficiency - Plan: Vitamin D, Ergocalciferol, (DRISDOL) 1.25 MG (50000 UT) CAPS capsule  Essential hypertension  At risk for osteoporosis  Class 3 severe obesity with serious comorbidity and body mass index (BMI) of 50.0 to 59.9 in adult, unspecified obesity type ([place])  PLAN:  Hypertension We discussed sodium restriction, working on healthy weight loss, and a regular exercise program as the means to achieve improved blood pressure control. Jennifer [name] agreed with this plan and agreed to follow up as directed. We will continue to monitor her blood pressure as well as her progress with the above lifestyle modifications. Jennifer [name] agrees to continue her medications and will watch for signs of hypotension as she continues her lifestyle modifications. We will follow up on blood pressure at next appointment. Jennifer [name] agrees to follow up with our clinic in 2 weeks.  Vitamin D Deficiency Jennifer [name] was informed that low vitamin D levels contributes to fatigue and are associated with obesity, breast, and colon cancer. Jennifer [name] agrees to continue taking prescription Vit D _0 ,000 IU every week #4 and we will refill for 1 month. She will follow up for routine testing of vitamin D, at least 2-3 times per year. She was informed of the risk of over-replacement of vitamin D and agrees to  not increase her dose unless she discusses this with [place] first. Jennifer [name] agrees to follow up with our  clinic in 2 weeks.  At risk for osteopenia and osteoporosis Jennifer [name] was given extended (15 minutes) osteoporosis prevention counseling today. Jennifer [name] is at risk for osteopenia and osteoporsis due to her vitamin D deficiency. She was encouraged to take her vitamin D and follow her higher calcium diet and increase strengthening exercise to help strengthen her bones and decrease her risk of osteopenia and osteoporosis.  Obesity Jennifer [name] is currently in the action stage of change. As such, her goal is to continue with weight loss efforts She has agreed to follow the Category 2 plan + 100 calories Jennifer [name] has been instructed to work up to a goal of 150 minutes of combined cardio and strengthening exercise per week for weight loss and overall health benefits. We discussed the following Behavioral Modification Strategies today: increasing lean protein intake, increasing vegetables, work on meal planning and easy cooking plans, and planning for success   Jennifer [name] has agreed to follow up with our clinic in 2 weeks. She was informed of the importance of frequent follow up visits to maximize her success with intensive lifestyle modifications for her multiple health conditions.   OBESITY BEHAVIORAL INTERVENTION VISIT  Today's visit was # 4   Starting weight: 288 lbs Starting date: 06/11/18 Today's weight : 286 lbs  Today's date: 07/24/2018 Total lbs lost to date: 2    ASK: We discussed the diagnosis of obesity with Jennifer [name] today and Jennifer [name] agreed to give [place] permission to discuss obesity behavioral modification therapy today.  ASSESS: Jennifer [name] has the diagnosis of obesity and her BMI today is 50.68 Jennifer [name] is in the action stage of change   ADVISE: Jennifer [name] was educated on the multiple health risks of obesity as well as the benefit of weight loss to improve her health. She was advised of the need for long term treatment and the importance of lifestyle modifications to improve her current health and to  decrease her risk of future health problems.  AGREE: Multiple dietary modification options and treatment options were discussed and  Jennifer [name] agreed to follow the recommendations documented in the above note.  ARRANGE: Jennifer [name] was educated on the importance of frequent visits to treat obesity as outlined per CMS and USPSTF guidelines and agreed to schedule her next follow up appointment today.  I, Trixie Dredge, am acting as transcriptionist for Ilene Qua, MD  I have reviewed the above documentation for accuracy and completeness, and I agree with the above. - Ilene Qua, MD

## 2018-07-30 ENCOUNTER — Telehealth: Payer: Self-pay | Admitting: *Deleted

## 2018-07-30 MED ORDER — VITAMIN D (ERGOCALCIFEROL) 1.25 MG (50000 UNIT) PO CAPS: 50000.0000 [IU] | ORAL_CAPSULE | ORAL | 0 refills | Status: DC

## 2018-07-30 NOTE — Telephone Encounter (Signed)
Received fax from [facility]. Patient's Cimzia will be delivered 07/31/18

## 2018-07-31 NOTE — Telephone Encounter (Signed)
Received Cimzia 07/31/18, placed in fridge.  Attempted to contact patient and left message for patient to call the office. To schedule appointment.

## 2018-08-06 ENCOUNTER — Ambulatory Visit (INDEPENDENT_AMBULATORY_CARE_PROVIDER_SITE_OTHER): Payer: Commercial Managed Care - PPO | Admitting: *Deleted

## 2018-08-06 DIAGNOSIS — M0579 Rheumatoid arthritis with rheumatoid factor of multiple sites without organ or systems involvement: Secondary | ICD-10-CM | POA: Diagnosis not present

## 2018-08-06 MED ORDER — CERTOLIZUMAB PEGOL 2 X 200 MG ~~LOC~~ KIT
400.0000 mg | PACK | Freq: Once | SUBCUTANEOUS | Status: AC
  Administered 2018-08-06: 400 mg via SUBCUTANEOUS

## 2018-08-06 NOTE — Progress Notes (Signed)
Patient in office for Cimzia. Patient denies fever, infection or use of antibiotics. Patient given injections in right and left lower abdomen. Patient tolerated injections well. Patient will return in one month for next injections.   Administrations This Visit    Certolizumab Pegol KIT 400 mg    Admin Date 08/06/2018 Action Given Dose 400 mg Route Subcutaneous Administered By Carole Binning, LPN

## 2018-08-12 ENCOUNTER — Ambulatory Visit (INDEPENDENT_AMBULATORY_CARE_PROVIDER_SITE_OTHER): Payer: Commercial Managed Care - PPO | Admitting: Family Medicine

## 2018-08-12 VITALS — BP 143/91 | HR 87 | Temp 97.4°F | Ht 63.0 in | Wt 284.0 lb

## 2018-08-12 DIAGNOSIS — I10 Essential (primary) hypertension: Secondary | ICD-10-CM | POA: Diagnosis not present

## 2018-08-12 DIAGNOSIS — Z6841 Body Mass Index (BMI) 40.0 and over, adult: Secondary | ICD-10-CM

## 2018-08-12 DIAGNOSIS — E559 Vitamin D deficiency, unspecified: Secondary | ICD-10-CM | POA: Diagnosis not present

## 2018-08-14 NOTE — Progress Notes (Signed)
Office: [phone]  /  Fax: [phone]   HPI:   Chief Complaint: OBESITY Jennifer [name] is here to discuss her progress with her obesity treatment plan. She is following the Category 2 plan and is following her eating plan approximately 90 % of the time. She states she is walking 8000 steps 7 days per week. Jennifer [name] reports that she feels challenged by the Cat 2 plan due to trying to think about what she is cooking and the calories. She feels that she may need to be stricter on meal plans. Needing to lose weight because she may need a knee replacement.  Her weight is 284 lb (128.8 kg) today and has had a weight loss of 2 pounds over a period of 2 weeks since her last visit. She has lost 4 lbs since starting treatment with [place].  Hypertension Jennifer [name] is a 42 y.o. female with hypertension.  [name] denies chest pain, chest pressure or shortness of breath on exertion. She is working weight loss to help control her blood pressure with the goal of decreasing her risk of heart attack and stroke. Tracys blood pressure is slightly elevated today but she states that she is in pain today.   Vitamin D deficiency [name] has a diagnosis of vitamin D deficiency. She is currently taking prescription Vit D and denies nausea, vomiting or muscle weakness. She still complains of fatigue but says that it has improved.    ALLERGIES: Allergies  Allergen Reactions  . Lisinopril-Hydrochlorothiazide Swelling and Other (See Comments)    Angioedema  Has tolerated maxide in past so most likely  The ACE inhibitor as the cause     MEDICATIONS: Current Outpatient Medications on File Prior to Visit  Medication Sig Dispense Refill  . amLODipine (NORVASC) 5 MG tablet Take 1 tablet (5 mg total) by mouth daily. (Patient taking differently: Take 5 mg by mouth at bedtime. ) 30 tablet 3  . CIMZIA 2 X 200 MG KIT INJECT 400 MG UNDER THE SKIN EVERY 28 (TWENTY-EIGHT) DAYS 1 each 2  . clindamycin-benzoyl peroxide  (BENZACLIN) gel Apply 1 application topically every other day.     . diclofenac sodium (VOLTAREN) 1 % GEL Apply three grams to three large joints up to three times daily (Patient taking differently: Apply 3 g topically 2 (two) times daily. ) 3 Tube 3  . fluocinonide ointment (LIDEX) 1.94 % Apply 1 application topically 2 (two) times daily.   5  . iron polysaccharides (FERREX 150) 150 MG capsule Take 1 capsule (150 mg total) by mouth daily. 30 capsule 5  . labetalol (NORMODYNE) 200 MG tablet Take 1 tablet (200 mg total) by mouth daily. (Patient taking differently: Take 200 mg by mouth at bedtime. ) 30 tablet 0  . Melatonin 10 MG CAPS Take 20 mg by mouth at bedtime as needed (for sleep).    . methocarbamol (ROBAXIN) 500 MG tablet Take 500 mg by mouth 2 (two) times daily between meals as needed for muscle spasms.    [name] oxyCODONE (OXY IR/ROXICODONE) 5 MG immediate release tablet Take 1 tablet (5 mg total) by mouth every 4 (four) hours as needed for severe pain. 20 tablet 0  . tretinoin (RETIN-A) 0.025 % gel Apply 1 application topically at bedtime. At night  5  . Vitamin D, Ergocalciferol, (DRISDOL) 1.25 MG (50000 UT) CAPS capsule Take 1 capsule (50,000 Units total) by mouth every 7 (seven) days. 4 capsule 0   No current facility-administered medications on file prior  to visit.     PAST MEDICAL HISTORY: Past Medical History:  Diagnosis Date  . Acute otitis media 08/28/2012   improved  change to liquid medication   . Anxiety   . Breast abscess of female    Recurrent  . Depression   . Family history of adverse reaction to anesthesia    father hard time waking once (12/20/2016)  . Fibroids    w/bleeding  . GERD (gastroesophageal reflux disease)   . History of blood transfusion    after surgery  . Hypertension   . Migraines    Dr. Orie Rout; "I have a few/month" (12/20/2016)  . Osteoarthritis   . Pill esophagitis 08/28/2012   amoxicillin  by hx  disc plan nl voice except hoarse not  drooling  close fu  if not getting better with plan stop aleve  liquid ibu onlyf necessary   . Recurrent periodic urticaria   . Rheumatoid arthritis ([place])    "55% of my body" (12/20/2016)  . Sleep apnea    wears cpap    PAST SURGICAL HISTORY: Past Surgical History:  Procedure Laterality Date  . BREAST SURGERY     abcess under left breast   . CYST EXCISION Left 2014   "elbow"  . HERNIA REPAIR    . INCISION AND DRAINAGE BREAST ABSCESS Left 2003  . INCISIONAL HERNIA REPAIR N/A 12/18/2016   Procedure: REPAIR INCISIONAL HERNIA WITH MESH;  Surgeon: Georganna Skeans, MD;  Location: [facility];  Service: General;  Laterality: N/A;  . INSERTION OF MESH N/A 12/18/2016   Procedure: INSERTION OF MESH;  Surgeon: Georganna Skeans, MD;  Location: [facility];  Service: General;  Laterality: N/A;  . KNEE ARTHROSCOPY WITH MENISCAL REPAIR Right 06/17/2018   Procedure: RIGHT KNEE ARTHROSCOPY WITH MENISCAL REPAIR;  Surgeon: Vickey Huger, MD;  Location: [facility];  Service: Orthopedics;  Laterality: Right;  . MYOMECTOMY N/A 09/06/2016   Procedure: ABOMINAL MYOMECTOMY;  Surgeon: Governor Specking, MD;  Location: [facility];  Service: Gynecology;  Laterality: N/A;  . UTERINE FIBROID SURGERY     35 fibroids    SOCIAL HISTORY: Social History   Tobacco Use  . Smoking status: Never Smoker  . Smokeless tobacco: Never Used  Substance Use Topics  . Alcohol use: Yes    Comment: social  . Drug use: Never    FAMILY HISTORY: Family History  Problem Relation Age of Onset  . Hypertension Mother   . Rheum arthritis Mother   . Hypertension Father   . Sleep apnea Father     ROS: Review of Systems  Constitutional: Positive for malaise/fatigue and weight loss.  Respiratory: Negative for shortness of breath.   Cardiovascular: Negative for chest pain.       Negative for chest pressure  Gastrointestinal: Negative for nausea and vomiting.  Musculoskeletal:       Negative for muscle weakness    PHYSICAL EXAM: Blood pressure (!)  143/91, pulse 87, temperature (!) 97.4 F (36.3 C), temperature source Oral, height 5' 3" (1.6 m), weight 284 lb (128.8 kg), SpO2 96 %. Body mass index is 50.31 kg/m. Physical Exam  Constitutional: She is oriented to person, place, and time. She appears well-developed and well-nourished.  Cardiovascular: Normal rate.  Pulmonary/Chest: Effort normal.  Musculoskeletal: Normal range of motion.  Neurological: She is alert and oriented to person, place, and time.  Skin: Skin is warm and dry.  Psychiatric: She has a normal mood and affect. Her behavior is normal.  Vitals reviewed.  RECENT LABS AND TESTS: BMET    Component Value Date/Time   NA 138 06/11/2018 1212   K 3.9 06/11/2018 1212   CL 99 06/11/2018 1212   CO2 24 06/11/2018 1212   GLUCOSE 92 06/11/2018 1212   GLUCOSE 90 01/28/2018 1503   BUN 9 06/11/2018 1212   CREATININE 0.85 06/11/2018 1212   CREATININE 0.99 01/28/2018 1503   CALCIUM 8.8 06/11/2018 1212   GFRNONAA 85 06/11/2018 1212   GFRNONAA 70 01/28/2018 1503   GFRAA 98 06/11/2018 1212   GFRAA 81 01/28/2018 1503   Lab Results  Component Value Date   HGBA1C 5.6 06/11/2018   Lab Results  Component Value Date   INSULIN 10.1 06/11/2018   CBC    Component Value Date/Time   WBC 5.1 06/11/2018 1212   WBC 6.3 01/28/2018 1503   RBC 3.94 06/11/2018 1212   RBC 3.78 (L) 01/28/2018 1503   HGB 11.9 06/11/2018 1212   HCT 36.6 06/11/2018 1212   PLT 310 06/11/2018 1212   MCV 93 06/11/2018 1212   MCH 30.2 06/11/2018 1212   MCH 31.0 01/28/2018 1503   MCHC 32.5 06/11/2018 1212   MCHC 34.3 01/28/2018 1503   RDW 14.4 06/11/2018 1212   LYMPHSABS 2.3 06/11/2018 1212   MONOABS 928 03/07/2017 1632   EOSABS 0.2 06/11/2018 1212   BASOSABS 0.0 06/11/2018 1212   Iron/TIBC/Ferritin/ %Sat No results found for: IRON, TIBC, FERRITIN, IRONPCTSAT Lipid Panel     Component Value Date/Time   CHOL 189 06/11/2018 1212   TRIG 52 06/11/2018 1212   HDL 71 06/11/2018 1212   CHOLHDL  2.7 06/11/2018 1212   LDLCALC 108 (H) 06/11/2018 1212   Hepatic Function Panel     Component Value Date/Time   PROT 8.0 06/11/2018 1212   ALBUMIN 3.8 06/11/2018 1212   AST 11 06/11/2018 1212   ALT 9 06/11/2018 1212   ALKPHOS 69 06/11/2018 1212   BILITOT 0.3 06/11/2018 1212   BILIDIR 0.2 10/29/2009 0835      Component Value Date/Time   TSH 1.950 06/11/2018 1212   Results for KINESHA, AUTEN (MRN [id number]) as of 08/14/2018 09:36  Ref. Range 06/26/2017 16:15  Alkaline phosphatase (APISO) Latest Ref Range: 33 - 115 U/L 63   ASSESSMENT AND PLAN: Essential hypertension  Vitamin D deficiency  Class 3 severe obesity with serious comorbidity and body mass index (BMI) of 50.0 to 59.9 in adult, unspecified obesity type (HCC)  PLAN: Hypertension We discussed sodium restriction, working on healthy weight loss, and a regular exercise program as the means to achieve improved blood pressure control. Amoreena agreed with this plan and agreed to follow up as directed. We will continue to monitor her blood pressure as well as her progress with the above lifestyle modifications. She will continue her medications as prescribed and will watch for signs of hypotension as she continues her lifestyle modifications. Reyana agrees to follow up with our office in 2 weeks.   Vitamin D Deficiency Lendora was informed that low vitamin D levels contributes to fatigue and are associated with obesity, breast, and colon cancer. She agrees to continue to take prescription Vit D _0 ,000 IU every week and will follow up for routine testing of vitamin D, at least 2-3 times per year. She was informed of the risk of over-replacement of vitamin D and agrees to not increase her dose unless she discusses this with [place] first. Matisse agrees to follow up with our office in 2 weeks.   Obesity Tahni is  currently in the action stage of change. As such, her goal is to continue with weight loss efforts She has agreed to follow the  Category 2 plan + 100 Emmaclaire has been instructed to work up to a goal of 150 minutes of combined cardio and strengthening exercise per week for weight loss and overall health benefits. We discussed the following Behavioral Modification Strategies today: increasing lean protein intake, planning for success,  increasing vegetables, work on meal planning and easy cooking plans and holiday eating strategies   Kandis has agreed to follow up with our clinic in 2 weeks. She was informed of the importance of frequent follow up visits to maximize her success with intensive lifestyle modifications for her multiple health conditions.   OBESITY BEHAVIORAL INTERVENTION VISIT  Today's visit was # 5   Starting weight: 288 lbs Starting date: 06/11/2018 Today's weight : Weight: 284 lb (128.8 kg)  Today's date: 08/12/2018 Total lbs lost to date: 4 lbs At least 15 minutes were spent on discussing the following behavioral intervention visit.   ASK: We discussed the diagnosis of obesity with [name] today and Lalonnie agreed to give [place] permission to discuss obesity behavioral modification therapy today.  ASSESS: Meher has the diagnosis of obesity and her BMI today is 50.32 Horace is in the action stage of change   ADVISE: Chrishonda was educated on the multiple health risks of obesity as well as the benefit of weight loss to improve her health. She was advised of the need for long term treatment and the importance of lifestyle modifications to improve her current health and to decrease her risk of future health problems.  AGREE: Multiple dietary modification options and treatment options were discussed and  Adiya agreed to follow the recommendations documented in the above note.  ARRANGE: Adiah was educated on the importance of frequent visits to treat obesity as outlined per CMS and USPSTF guidelines and agreed to schedule her next follow up appointment today.  Felipa Emory, CMA, am acting as  transcriptionist for Ilene Qua, MD.   I have reviewed the above documentation for accuracy and completeness, and I agree with the above. - Ilene Qua, MD

## 2018-08-19 NOTE — Progress Notes (Deleted)
Office Visit Note  Patient: Jennifer [name]             Date of Birth: Apr 28, 1976           MRN: [id number]             PCP: Burnis Medin, MD Referring: Burnis Medin, MD Visit Date: 08/29/2018 Occupation: @GUAROCC @  Subjective:  No chief complaint on file.  Cimzia 400 mg in office every 28 days.  Last TB gold negative on 01/28/2018.  Most recent CBC/CMP within normal limits on 06/11/2018.  Next CBC/CMP due at the end of the month and then every 3 months.  Standing orders are in place.  Recommend annual flu and Pneumovax 23 vaccine as indicated.  Recommend yearly skin exam.  No x-rays from our office in epic.  History of Present Illness: Jennifer [name] is a 42 y.o. female  history of seropositive rheumatoid arthritis and osteoarthritis.   Activities of Daily Living:  Patient reports morning stiffness for *** {minute/hour:19697}.   Patient {ACTIONS;DENIES/REPORTS:21021675::"Denies"} nocturnal pain.  Difficulty dressing/grooming: {ACTIONS;DENIES/REPORTS:21021675::"Denies"} Difficulty climbing stairs: {ACTIONS;DENIES/REPORTS:21021675::"Denies"} Difficulty getting out of chair: {ACTIONS;DENIES/REPORTS:21021675::"Denies"} Difficulty using hands for taps, buttons, cutlery, and/or writing: {ACTIONS;DENIES/REPORTS:21021675::"Denies"}  No Rheumatology ROS completed.   PMFS History:  Patient Active Problem List   Diagnosis Date Noted  . Primary osteoarthritis of both hands 02/04/2018  . Primary osteoarthritis of both knees 05/22/2017  . Incisional hernia 12/18/2016  . Leiomyoma of uterus 09/06/2016  . Fibroid, uterine   . Essential hypertension 05/25/2015  . Recurrent headache 05/25/2015  . Head lump 07/31/2014  . Edema 12/29/2013  . Baker's cyst, ruptured 12/25/2013  . Cough, persistent 12/12/2013  . Left leg swelling 12/12/2013  . Cough 12/12/2013  . High risk medication use 12/12/2013  . Recurrent periodic urticaria   . Rheumatoid arthritis ([place]) 08/28/2012  . CHRONIC  LARYNGITIS 11/03/2010  . ALLERGIC RHINITIS 11/03/2010  . PARESTHESIA 07/28/2010  . DYSMENORRHEA 10/15/2007  . FIBROIDS, UTERUS 07/24/2007  . OBESITY 07/24/2007  . SLEEPLESSNESS 07/24/2007  . HEADACHE 07/24/2007    Past Medical History:  Diagnosis Date  . Acute otitis media 08/28/2012   improved  change to liquid medication   . Anxiety   . Breast abscess of female    Recurrent  . Depression   . Family history of adverse reaction to anesthesia    father hard time waking once (12/20/2016)  . Fibroids    w/bleeding  . GERD (gastroesophageal reflux disease)   . History of blood transfusion    after surgery  . Hypertension   . Migraines    Dr. Orie Rout; "I have a few/month" (12/20/2016)  . Osteoarthritis   . Pill esophagitis 08/28/2012   amoxicillin  by hx  disc plan nl voice except hoarse not drooling  close fu  if not getting better with plan stop aleve  liquid ibu onlyf necessary   . Recurrent periodic urticaria   . Rheumatoid arthritis ([place])    "55% of my body" (12/20/2016)  . Sleep apnea    wears cpap    Family History  Problem Relation Age of Onset  . Hypertension Mother   . Rheum arthritis Mother   . Hypertension Father   . Sleep apnea Father    Past Surgical History:  Procedure Laterality Date  . BREAST SURGERY     abcess under left breast   . CYST EXCISION Left 2014   "elbow"  . HERNIA REPAIR    . INCISION AND DRAINAGE  BREAST ABSCESS Left 2003  . INCISIONAL HERNIA REPAIR N/A 12/18/2016   Procedure: REPAIR INCISIONAL HERNIA WITH MESH;  Surgeon: Georganna Skeans, MD;  Location: [facility];  Service: General;  Laterality: N/A;  . INSERTION OF MESH N/A 12/18/2016   Procedure: INSERTION OF MESH;  Surgeon: Georganna Skeans, MD;  Location: [facility];  Service: General;  Laterality: N/A;  . KNEE ARTHROSCOPY WITH MENISCAL REPAIR Right 06/17/2018   Procedure: RIGHT KNEE ARTHROSCOPY WITH MENISCAL REPAIR;  Surgeon: Vickey Huger, MD;  Location: [facility];  Service: Orthopedics;   Laterality: Right;  . MYOMECTOMY N/A 09/06/2016   Procedure: ABOMINAL MYOMECTOMY;  Surgeon: Governor Specking, MD;  Location: [facility];  Service: Gynecology;  Laterality: N/A;  . UTERINE FIBROID SURGERY     35 fibroids   Social History   Social History Narrative  . Not on file    Objective: Vital Signs: There were no vitals taken for this visit.   Physical Exam   Musculoskeletal Exam: ***  CDAI Exam: CDAI Score: Not documented Patient Global Assessment: Not documented; Provider Global Assessment: Not documented Swollen: Not documented; Tender: Not documented Joint Exam   Not documented   There is currently no information documented on the homunculus. Go to the Rheumatology activity and complete the homunculus joint exam.  Investigation: No additional findings.  Imaging: No results found.  Recent Labs: Lab Results  Component Value Date   WBC 5.1 06/11/2018   HGB 11.9 06/11/2018   PLT 310 06/11/2018   NA 138 06/11/2018   K 3.9 06/11/2018   CL 99 06/11/2018   CO2 24 06/11/2018   GLUCOSE 92 06/11/2018   BUN 9 06/11/2018   CREATININE 0.85 06/11/2018   BILITOT 0.3 06/11/2018   ALKPHOS 69 06/11/2018   AST 11 06/11/2018   ALT 9 06/11/2018   PROT 8.0 06/11/2018   ALBUMIN 3.8 06/11/2018   CALCIUM 8.8 06/11/2018   GFRAA 98 06/11/2018   QFTBGOLDPLUS NEGATIVE 01/28/2018    Speciality Comments: No specialty comments available.  Procedures:  No procedures performed Allergies: Lisinopril-hydrochlorothiazide   Assessment / Plan:     Visit Diagnoses: Rheumatoid arthritis involving multiple sites with positive rheumatoid factor (HCC)  High risk medication use - Cimzia sq once monthly   Primary osteoarthritis of both hands  Primary osteoarthritis of both knees  Essential hypertension   Orders: No orders of the defined types were placed in this encounter.  No orders of the defined types were placed in this encounter.   Face-to-face time spent with patient was  *** minutes. Greater than 50% of time was spent in counseling and coordination of care.  Follow-Up Instructions: No follow-ups on file.   Ofilia Neas, PA-C  Note - This record has been created using Dragon software.  Chart creation errors have been sought, but may not always  have been located. Such creation errors do not reflect on  the standard of medical care.

## 2018-08-29 ENCOUNTER — Ambulatory Visit: Payer: Commercial Managed Care - PPO | Admitting: Physician Assistant

## 2018-08-29 ENCOUNTER — Encounter (INDEPENDENT_AMBULATORY_CARE_PROVIDER_SITE_OTHER): Payer: Self-pay | Admitting: Family Medicine

## 2018-08-29 ENCOUNTER — Ambulatory Visit (INDEPENDENT_AMBULATORY_CARE_PROVIDER_SITE_OTHER): Payer: Commercial Managed Care - PPO | Admitting: Family Medicine

## 2018-08-29 VITALS — BP 163/99 | HR 83 | Temp 97.7°F | Ht 63.0 in | Wt 283.0 lb

## 2018-08-29 DIAGNOSIS — Z6841 Body Mass Index (BMI) 40.0 and over, adult: Secondary | ICD-10-CM

## 2018-08-29 DIAGNOSIS — E559 Vitamin D deficiency, unspecified: Secondary | ICD-10-CM | POA: Diagnosis not present

## 2018-08-29 DIAGNOSIS — Z9189 Other specified personal risk factors, not elsewhere classified: Secondary | ICD-10-CM | POA: Diagnosis not present

## 2018-08-29 DIAGNOSIS — I10 Essential (primary) hypertension: Secondary | ICD-10-CM | POA: Diagnosis not present

## 2018-08-29 MED ORDER — VITAMIN D (ERGOCALCIFEROL) 1.25 MG (50000 UNIT) PO CAPS: 50000.0000 [IU] | ORAL_CAPSULE | ORAL | 0 refills | Status: DC

## 2018-08-30 NOTE — Progress Notes (Signed)
 Office Visit Note  Patient: Jennifer [name]             Date of Birth: 10/30/1975           MRN: [id number]             PCP: Panosh, Wanda K, MD Referring: Panosh, Wanda K, MD Visit Date: 09/02/2018 Occupation: @GUAROCC@  Subjective:  Right knee pain   History of Present Illness: Jennifer [name] is a 42 y.o. female with history of seropositive rheumatoid arthritis and osteoarthritis.  She is on Cimzia sq injections once monthly. She was off of Cimzia before surgery on 06/17/18.  Dr. Lucey performed a right knee arthroscopy for partial right medial meniscectomy.  She continues to have pain, stiffness, and swelling in the right knee joint.  She states the pain has slowly been improving.  She continues to go to PT twice weekly.  She had a right knee cortisone injection on 08/22/18 performed by Dr. Lucey. She plans on getting a [employer] in January.  She reports she will be unable to get her Cimiza injection this month due to not having time due to other appointments and being short staffed at work.  She will schedule her next injection in January and have labs at that time.   She denies any recent rheumatoid arthritis flares.  She denies any other joint pain or joint swelling.  She feels as though Cimzia continues to be effective.   Activities of Daily Living:  Patient reports morning stiffness for 0  minutes.   Patient Denies nocturnal pain.  Difficulty dressing/grooming: Denies Difficulty climbing stairs: Reports Difficulty getting out of chair: Reports Difficulty using hands for taps, buttons, cutlery, and/or writing: Denies  Review of Systems  Constitutional: Positive for fatigue.  HENT: Negative for mouth sores, mouth dryness and nose dryness.   Eyes: Negative for pain, visual disturbance and dryness.  Respiratory: Negative for cough, hemoptysis, shortness of breath and difficulty breathing.   Cardiovascular: Negative for chest pain, palpitations, hypertension and swelling in  legs/feet.  Gastrointestinal: Negative for blood in stool, constipation and diarrhea.  Endocrine: Negative for increased urination.  Genitourinary: Negative for painful urination.  Musculoskeletal: Positive for arthralgias, joint pain and joint swelling. Negative for myalgias, muscle weakness, morning stiffness, muscle tenderness and myalgias.  Skin: Negative for color change, pallor, rash, hair loss, nodules/bumps, skin tightness, ulcers and sensitivity to sunlight.  Allergic/Immunologic: Negative for susceptible to infections.  Neurological: Positive for headaches (Hx of migraines). Negative for dizziness, numbness and weakness.  Hematological: Negative for swollen glands.  Psychiatric/Behavioral: Negative for depressed mood and sleep disturbance. The patient is not nervous/anxious.     PMFS History:  Patient Active Problem List   Diagnosis Date Noted  . Primary osteoarthritis of both hands 02/04/2018  . Primary osteoarthritis of both knees 05/22/2017  . Incisional hernia 12/18/2016  . Leiomyoma of uterus 09/06/2016  . Fibroid, uterine   . Essential hypertension 05/25/2015  . Recurrent headache 05/25/2015  . Head lump 07/31/2014  . Edema 12/29/2013  . Baker's cyst, ruptured 12/25/2013  . Cough, persistent 12/12/2013  . Left leg swelling 12/12/2013  . Cough 12/12/2013  . High risk medication use 12/12/2013  . Recurrent periodic urticaria   . Rheumatoid arthritis (HCC) 08/28/2012  . CHRONIC LARYNGITIS 11/03/2010  . ALLERGIC RHINITIS 11/03/2010  . PARESTHESIA 07/28/2010  . DYSMENORRHEA 10/15/2007  . FIBROIDS, UTERUS 07/24/2007  . OBESITY 07/24/2007  . SLEEPLESSNESS 07/24/2007  . HEADACHE 07/24/2007      Past Medical History:  Diagnosis Date  . Acute otitis media 08/28/2012   improved  change to liquid medication   . Anxiety   . Breast abscess of female    Recurrent  . Depression   . Family history of adverse reaction to anesthesia    father hard time waking once  (12/20/2016)  . Fibroids    w/bleeding  . GERD (gastroesophageal reflux disease)   . History of blood transfusion    after surgery  . Hypertension   . Migraines    Dr. Marshall Freeman; "I have a few/month" (12/20/2016)  . Osteoarthritis   . Pill esophagitis 08/28/2012   amoxicillin  by hx  disc plan nl voice except hoarse not drooling  close fu  if not getting better with plan stop aleve  liquid ibu onlyf necessary   . Recurrent periodic urticaria   . Rheumatoid arthritis (HCC)    "55% of my body" (12/20/2016)  . Sleep apnea    wears cpap    Family History  Problem Relation Age of Onset  . Hypertension Mother   . Rheum arthritis Mother   . Hypertension Father   . Sleep apnea Father    Past Surgical History:  Procedure Laterality Date  . BREAST SURGERY     abcess under left breast   . CYST EXCISION Left 2014   "elbow"  . HERNIA REPAIR    . INCISION AND DRAINAGE BREAST ABSCESS Left 2003  . INCISIONAL HERNIA REPAIR N/A 12/18/2016   Procedure: REPAIR INCISIONAL HERNIA WITH MESH;  Surgeon: Burke Thompson, MD;  Location: [facility];  Service: General;  Laterality: N/A;  . INSERTION OF MESH N/A 12/18/2016   Procedure: INSERTION OF MESH;  Surgeon: Burke Thompson, MD;  Location: [facility];  Service: General;  Laterality: N/A;  . KNEE ARTHROSCOPY WITH MENISCAL REPAIR Right 06/17/2018   Procedure: RIGHT KNEE ARTHROSCOPY WITH MENISCAL REPAIR;  Surgeon: Lucey, Steve, MD;  Location: [facility];  Service: Orthopedics;  Laterality: Right;  . MYOMECTOMY N/A 09/06/2016   Procedure: ABOMINAL MYOMECTOMY;  Surgeon: Tamer Yalcinkaya, MD;  Location: [facility];  Service: Gynecology;  Laterality: N/A;  . UTERINE FIBROID SURGERY     35 fibroids   Social History   Social History Narrative  . Not on file    Objective: Vital Signs: BP 134/84 (BP Location: Right Arm, Patient Position: Sitting, Cuff Size: Large)   Pulse 73   Resp 16   Ht 5' 4" (1.626 m)   Wt 291 lb (132 kg)   BMI 49.95 kg/m    Physical  Exam Vitals signs and nursing note reviewed.  Constitutional:      Appearance: She is well-developed.  HENT:     Head: Normocephalic and atraumatic.  Eyes:     Conjunctiva/sclera: Conjunctivae normal.  Neck:     Musculoskeletal: Normal range of motion.  Cardiovascular:     Rate and Rhythm: Normal rate and regular rhythm.     Heart sounds: Normal heart sounds.  Pulmonary:     Effort: Pulmonary effort is normal.     Breath sounds: Normal breath sounds.  Abdominal:     General: Bowel sounds are normal.     Palpations: Abdomen is soft.  Lymphadenopathy:     Cervical: No cervical adenopathy.  Skin:    General: Skin is warm and dry.     Capillary Refill: Capillary refill takes less than 2 seconds.  Neurological:     Mental Status: She is alert and oriented to person,   place, and time.  Psychiatric:        Behavior: Behavior normal.      Musculoskeletal Exam: C-spine, thoracic spine, and lumbar spine good ROM.  Shoulder joints, elbow joints, wrist joints, MCPS, PIPs, and DIPs good ROM with no synovitis.  Complete fist formation.  Hip joints good ROM with no discomfort.  Limited extension of right knee. Right knee flexion is 100. Right knee warmth and effusion.  Left knee full ROM with no warmth or effusion. No tenderness or swelling of ankle joints.   CDAI Exam: CDAI Score: 0.4  Patient Global Assessment: 2 (mm); Provider Global Assessment: 2 (mm) Swollen: 0 ; Tender: 0  Joint Exam   Not documented   There is currently no information documented on the homunculus. Go to the Rheumatology activity and complete the homunculus joint exam.  Investigation: No additional findings.  Imaging: No results found.  Recent Labs: Lab Results  Component Value Date   WBC 5.1 06/11/2018   HGB 11.9 06/11/2018   PLT 310 06/11/2018   NA 138 06/11/2018   K 3.9 06/11/2018   CL 99 06/11/2018   CO2 24 06/11/2018   GLUCOSE 92 06/11/2018   BUN 9 06/11/2018   CREATININE 0.85 06/11/2018    BILITOT 0.3 06/11/2018   ALKPHOS 69 06/11/2018   AST 11 06/11/2018   ALT 9 06/11/2018   PROT 8.0 06/11/2018   ALBUMIN 3.8 06/11/2018   CALCIUM 8.8 06/11/2018   GFRAA 98 06/11/2018   QFTBGOLDPLUS NEGATIVE 01/28/2018    Speciality Comments: No specialty comments available.  Procedures:  No procedures performed Allergies: Lisinopril-hydrochlorothiazide   Assessment / Plan:     Visit Diagnoses: Rheumatoid arthritis involving multiple sites with positive rheumatoid factor (HCC) -  +CCP, +HLA B27: She has no active synovitis.  She has not had any recent rheumatoid arthritis flares.  She is on Cimzia sq injections once monthly.  Her most recent injection was on 08/06/18. She is planning on scheduling her next injection in January 2020 due to being unable to make a sooner appointment around the holidays.  She will continue on this current treatment regimen. She was advised to notify us if she develops increased joint pain or joint swelling.  She will follow up in 5 months.   High risk medication use - Cimzia subcutaneous monthly injection. TB gold negative 01/28/18.  CBC and CMP drawn on 06/11/18.  She is due for lab work in December and every 3 months.  She will have lab work when she returns for her next Cimzia injection in January 2020.  She declined having lab work today.    Primary osteoarthritis of both hands: She has no synovitis or tenderness on exam.  Joint protection and muscle strengthening were discussed.   Primary osteoarthritis of both knees: Right knee warmth and effusion s/p arthroscopy.  Left knee no warmth or effusion.    S/P right knee arthroscopy: Dr. Ronnie Derby 06/17/18 partial medial meniscectomy.  She continues to have limited extension and flexion to 100 degrees.  Warmth and effusion of the right knee joint on exam.  She continues to go to PT twice a week.  She had a cortisone injection on 08/22/18.    Essential hypertension   Orders: No orders of the defined types were placed  in this encounter.  No orders of the defined types were placed in this encounter.     Follow-Up Instructions: Return in about 5 months (around 02/01/2019) for Rheumatoid arthritis, Osteoarthritis.   Ofilia Neas,  PA-C   I examined and evaluated the patient with Hazel Sams PA.  Patient had no synovitis on my examination.  She has some clinical findings consistent with osteoarthritis.  Will continue current regimen.  She had recent right knee joint arthroscopic surgery and is still recovering from that.  The plan of care was discussed as noted above.  Bo Merino, MD  Note - This record has been created using Editor, commissioning.  Chart creation errors have been sought, but may not always  have been located. Such creation errors do not reflect on  the standard of medical care.

## 2018-09-02 ENCOUNTER — Encounter: Payer: Self-pay | Admitting: Physician Assistant

## 2018-09-02 ENCOUNTER — Ambulatory Visit: Payer: Commercial Managed Care - PPO | Admitting: Rheumatology

## 2018-09-02 VITALS — BP 134/84 | HR 73 | Resp 16 | Ht 64.0 in | Wt 291.0 lb

## 2018-09-02 DIAGNOSIS — M0579 Rheumatoid arthritis with rheumatoid factor of multiple sites without organ or systems involvement: Secondary | ICD-10-CM

## 2018-09-02 DIAGNOSIS — M19041 Primary osteoarthritis, right hand: Secondary | ICD-10-CM

## 2018-09-02 DIAGNOSIS — M170 Bilateral primary osteoarthritis of knee: Secondary | ICD-10-CM | POA: Diagnosis not present

## 2018-09-02 DIAGNOSIS — Z9889 Other specified postprocedural states: Secondary | ICD-10-CM

## 2018-09-02 DIAGNOSIS — Z79899 Other long term (current) drug therapy: Secondary | ICD-10-CM | POA: Diagnosis not present

## 2018-09-02 DIAGNOSIS — M19042 Primary osteoarthritis, left hand: Secondary | ICD-10-CM

## 2018-09-02 DIAGNOSIS — I10 Essential (primary) hypertension: Secondary | ICD-10-CM

## 2018-09-02 NOTE — Progress Notes (Signed)
Office: [phone]  /  Fax: [phone]   HPI:   Chief Complaint: OBESITY Jennifer [name] is here to discuss her progress with her obesity treatment plan. She is on the Category 2 plan + 100 calories and is following her eating plan approximately 80-100 % of the time. She states she is riding the bike for 10-15 minutes 2-3 times per week. Jennifer [name] did indulge over Thanksgiving and got back on track afterwards. She has a party on Christmas Eve and no plans for Christmas Day.  Her weight is 283 lb (128.4 kg) today and has had a weight loss of 1 pound over a period of 2 to 3 weeks since her last visit. She has lost 0 lbs since starting treatment with [place].  Vitamin D Deficiency Jennifer [name] has a diagnosis of vitamin D deficiency. She is currently taking prescription Vit D. She notes fatigue and denies nausea, vomiting or muscle weakness.  At risk for osteopenia and osteoporosis Emberli is at higher risk of osteopenia and osteoporosis due to vitamin D deficiency.   Hypertension Jennifer [name] is a 42 y.o. female with hypertension. Jennifer [name]'s blood pressure is slightly elevated, she is in pain today secondary to menstrual cramps and knee pain. She denies chest pain, chest pressure, or headaches. She is working weight loss to help control her blood pressure with the goal of decreasing her risk of heart attack and stroke. Jennifer [name]'s blood pressure is not currently controlled.  ALLERGIES: Allergies  Allergen Reactions  . Lisinopril-Hydrochlorothiazide Swelling and Other (See Comments)    Angioedema  Has tolerated maxide in past so most likely  The ACE inhibitor as the cause     MEDICATIONS: Current Outpatient Medications on File Prior to Visit  Medication Sig Dispense Refill  . amLODipine (NORVASC) 5 MG tablet Take 1 tablet (5 mg total) by mouth daily. (Patient taking differently: Take 5 mg by mouth at bedtime. ) 30 tablet 3  . CIMZIA 2 X 200 MG KIT INJECT 400 MG UNDER THE SKIN EVERY 28 (TWENTY-EIGHT) DAYS 1 each 2    . clindamycin-benzoyl peroxide (BENZACLIN) gel Apply 1 application topically every other day.     . diclofenac sodium (VOLTAREN) 1 % GEL Apply three grams to three large joints up to three times daily (Patient taking differently: Apply 3 g topically 2 (two) times daily. ) 3 Tube 3  . fluocinonide ointment (LIDEX) 9.44 % Apply 1 application topically 2 (two) times daily.   5  . labetalol (NORMODYNE) 200 MG tablet Take 1 tablet (200 mg total) by mouth daily. (Patient taking differently: Take 200 mg by mouth at bedtime. ) 30 tablet 0  . Melatonin 10 MG CAPS Take 20 mg by mouth at bedtime as needed (for sleep).    . methocarbamol (ROBAXIN) 500 MG tablet Take 500 mg by mouth 2 (two) times daily between meals as needed for muscle spasms.    Jennifer [name] oxyCODONE (OXY IR/ROXICODONE) 5 MG immediate release tablet Take 1 tablet (5 mg total) by mouth every 4 (four) hours as needed for severe pain. 20 tablet 0  . tretinoin (RETIN-A) 0.025 % gel Apply 1 application topically at bedtime. At night  5   No current facility-administered medications on file prior to visit.     PAST MEDICAL HISTORY: Past Medical History:  Diagnosis Date  . Acute otitis media 08/28/2012   improved  change to liquid medication   . Anxiety   . Breast abscess of female    Recurrent  . Depression   .  Family history of adverse reaction to anesthesia    father hard time waking once (12/20/2016)  . Fibroids    w/bleeding  . GERD (gastroesophageal reflux disease)   . History of blood transfusion    after surgery  . Hypertension   . Migraines    Dr. Orie Rout; "I have a few/month" (12/20/2016)  . Osteoarthritis   . Pill esophagitis 08/28/2012   amoxicillin  by hx  disc plan nl voice except hoarse not drooling  close fu  if not getting better with plan stop aleve  liquid ibu onlyf necessary   . Recurrent periodic urticaria   . Rheumatoid arthritis ([place])    "55% of my body" (12/20/2016)  . Sleep apnea    wears cpap    PAST  SURGICAL HISTORY: Past Surgical History:  Procedure Laterality Date  . BREAST SURGERY     abcess under left breast   . CYST EXCISION Left 2014   "elbow"  . HERNIA REPAIR    . INCISION AND DRAINAGE BREAST ABSCESS Left 2003  . INCISIONAL HERNIA REPAIR N/A 12/18/2016   Procedure: REPAIR INCISIONAL HERNIA WITH MESH;  Surgeon: Georganna Skeans, MD;  Location: [facility];  Service: General;  Laterality: N/A;  . INSERTION OF MESH N/A 12/18/2016   Procedure: INSERTION OF MESH;  Surgeon: Georganna Skeans, MD;  Location: [facility];  Service: General;  Laterality: N/A;  . KNEE ARTHROSCOPY WITH MENISCAL REPAIR Right 06/17/2018   Procedure: RIGHT KNEE ARTHROSCOPY WITH MENISCAL REPAIR;  Surgeon: Vickey Huger, MD;  Location: [facility];  Service: Orthopedics;  Laterality: Right;  . MYOMECTOMY N/A 09/06/2016   Procedure: ABOMINAL MYOMECTOMY;  Surgeon: Governor Specking, MD;  Location: [facility];  Service: Gynecology;  Laterality: N/A;  . UTERINE FIBROID SURGERY     35 fibroids    SOCIAL HISTORY: Social History   Tobacco Use  . Smoking status: Never Smoker  . Smokeless tobacco: Never Used  Substance Use Topics  . Alcohol use: Yes    Comment: social  . Drug use: Never    FAMILY HISTORY: Family History  Problem Relation Age of Onset  . Hypertension Mother   . Rheum arthritis Mother   . Hypertension Father   . Sleep apnea Father     ROS: Review of Systems  Constitutional: Positive for malaise/fatigue and weight loss.  Cardiovascular: Negative for chest pain.       Negative chest pressure  Gastrointestinal: Negative for nausea and vomiting.  Musculoskeletal:       Negative muscle weakness + Knee pain  Neurological: Negative for headaches.    PHYSICAL EXAM: Blood pressure (!) 163/99, pulse 83, temperature 97.7 F (36.5 C), temperature source Oral, height 5' 3"  (1.6 m), weight 283 lb (128.4 kg), SpO2 98 %. Body mass index is 50.13 kg/m. Physical Exam Vitals signs reviewed.  Constitutional:       Appearance: Normal appearance. She is obese.  Cardiovascular:     Rate and Rhythm: Normal rate.  Pulmonary:     Effort: Pulmonary effort is normal.  Musculoskeletal: Normal range of motion.  Skin:    General: Skin is warm and dry.  Neurological:     Mental Status: She is alert and oriented to person, place, and time.  Psychiatric:        Mood and Affect: Mood normal.        Behavior: Behavior normal.     RECENT LABS AND TESTS: BMET    Component Value Date/Time   NA 138 06/11/2018 1212  K 3.9 06/11/2018 1212   CL 99 06/11/2018 1212   CO2 24 06/11/2018 1212   GLUCOSE 92 06/11/2018 1212   GLUCOSE 90 01/28/2018 1503   BUN 9 06/11/2018 1212   CREATININE 0.85 06/11/2018 1212   CREATININE 0.99 01/28/2018 1503   CALCIUM 8.8 06/11/2018 1212   GFRNONAA 85 06/11/2018 1212   GFRNONAA 70 01/28/2018 1503   GFRAA 98 06/11/2018 1212   GFRAA 81 01/28/2018 1503   Lab Results  Component Value Date   HGBA1C 5.6 06/11/2018   Lab Results  Component Value Date   INSULIN 10.1 06/11/2018   CBC    Component Value Date/Time   WBC 5.1 06/11/2018 1212   WBC 6.3 01/28/2018 1503   RBC 3.94 06/11/2018 1212   RBC 3.78 (L) 01/28/2018 1503   HGB 11.9 06/11/2018 1212   HCT 36.6 06/11/2018 1212   PLT 310 06/11/2018 1212   MCV 93 06/11/2018 1212   MCH 30.2 06/11/2018 1212   MCH 31.0 01/28/2018 1503   MCHC 32.5 06/11/2018 1212   MCHC 34.3 01/28/2018 1503   RDW 14.4 06/11/2018 1212   LYMPHSABS 2.3 06/11/2018 1212   MONOABS 928 03/07/2017 1632   EOSABS 0.2 06/11/2018 1212   BASOSABS 0.0 06/11/2018 1212   Iron/TIBC/Ferritin/ %Sat No results found for: IRON, TIBC, FERRITIN, IRONPCTSAT Lipid Panel     Component Value Date/Time   CHOL 189 06/11/2018 1212   TRIG 52 06/11/2018 1212   HDL 71 06/11/2018 1212   CHOLHDL 2.7 06/11/2018 1212   LDLCALC 108 (H) 06/11/2018 1212   Hepatic Function Panel     Component Value Date/Time   PROT 8.0 06/11/2018 1212   ALBUMIN 3.8 06/11/2018 1212    AST 11 06/11/2018 1212   ALT 9 06/11/2018 1212   ALKPHOS 69 06/11/2018 1212   BILITOT 0.3 06/11/2018 1212   BILIDIR 0.2 10/29/2009 0835      Component Value Date/Time   TSH 1.950 06/11/2018 1212   Results for JOURNEY, CASTONGUAY (MRN [id number]) as of 09/02/2018 09:37  Ref. Range 06/11/2018 12:12  Vitamin D, 25-Hydroxy Latest Ref Range: 30.0 - 100.0 ng/mL 18.9 (L)   ASSESSMENT AND PLAN: Vitamin D deficiency - Plan: Vitamin D, Ergocalciferol, (DRISDOL) 1.25 MG (50000 UT) CAPS capsule  Essential hypertension  At risk for osteoporosis  Class 3 severe obesity with serious comorbidity and body mass index (BMI) of 50.0 to 59.9 in adult, unspecified obesity type ([place])  PLAN:  Vitamin D Deficiency Dashayla was informed that low vitamin D levels contributes to fatigue and are associated with obesity, breast, and colon cancer. Johnda agrees to continue taking prescription Vit D @50 ,000 IU every week #4 and we will refill for 1 month. She will follow up for routine testing of vitamin D, at least 2-3 times per year. She was informed of the risk of over-replacement of vitamin D and agrees to not increase her dose unless she discusses this with [place] first. Tametha agrees to follow up with our clinic in 3 weeks.  At risk for osteopenia and osteoporosis Crimson was given extended (15 minutes) osteoporosis prevention counseling today. Reneshia is at risk for osteopenia and osteoporsis due to her vitamin D deficiency. She was encouraged to take her vitamin D and follow her higher calcium diet and increase strengthening exercise to help strengthen her bones and decrease her risk of osteopenia and osteoporosis.  Hypertension We discussed sodium restriction, working on healthy weight loss, and a regular exercise program as the means to achieve improved blood  pressure control. Amaree agreed with this plan and agreed to follow up as directed. We will continue to monitor her blood pressure as well as her progress with the  above lifestyle modifications. Kamori agrees to continue her medications and will watch for signs of hypotension as she continues her lifestyle modifications. We will follow up on blood pressure at next appointment. Niema agrees to follow up with our clinic in 3 weeks.  Obesity Jameelah is currently in the action stage of change. As such, her goal is to continue with weight loss efforts She has agreed to follow the Category 2 plan + 100 calories Zamoria has been instructed to work up to a goal of 150 minutes of combined cardio and strengthening exercise per week for weight loss and overall health benefits. We discussed the following Behavioral Modification Strategies today: increasing lean protein intake, work on meal planning and easy cooking plans, holiday eating strategies, better snacking choices, and planning for success    Edona has agreed to follow up with our clinic in 3 weeks. She was informed of the importance of frequent follow up visits to maximize her success with intensive lifestyle modifications for her multiple health conditions.   OBESITY BEHAVIORAL INTERVENTION VISIT  Today's visit was # 6   Starting weight: 288 lbs Starting date: 06/11/18 Today's weight : 283 lbs  Today's date: 08/29/2018 Total lbs lost to date: 0    ASK: We discussed the diagnosis of obesity with [name] today and Bernetha agreed to give [place] permission to discuss obesity behavioral modification therapy today.  ASSESS: Lylia has the diagnosis of obesity and her BMI today is 49.93 Diahann is in the action stage of change   ADVISE: Telisa was educated on the multiple health risks of obesity as well as the benefit of weight loss to improve her health. She was advised of the need for long term treatment and the importance of lifestyle modifications to improve her current health and to decrease her risk of future health problems.  AGREE: Multiple dietary modification options and treatment options were  discussed and  Jaydalee agreed to follow the recommendations documented in the above note.  ARRANGE: Autry was educated on the importance of frequent visits to treat obesity as outlined per CMS and USPSTF guidelines and agreed to schedule her next follow up appointment today.  I, Trixie Dredge, am acting as transcriptionist for Ilene Qua, MD  I have reviewed the above documentation for accuracy and completeness, and I agree with the above. - Ilene Qua, MD

## 2018-09-02 NOTE — Patient Instructions (Signed)
Standing Labs We placed an order today for your standing lab work.    Please come back and get your standing labs in December and every 3 months   We have open lab Monday through Friday from 8:30-11:30 AM and 1:30-4:00 PM  at the office of Dr. Shaili Deveshwar.   You may experience shorter wait times on Monday and Friday afternoons. The office is located at [address], [address], [place], [place] [address] No appointment is necessary.   Labs are drawn by [facility].  You may receive a bill from [facility] for your lab work. If you have any questions regarding directions or hours of operation,  please call [phone].   Just as a reminder please drink plenty of water prior to coming for your lab work. Thanks!   

## 2018-09-14 ENCOUNTER — Other Ambulatory Visit: Payer: Self-pay | Admitting: Internal Medicine

## 2018-09-19 ENCOUNTER — Encounter (INDEPENDENT_AMBULATORY_CARE_PROVIDER_SITE_OTHER): Payer: Self-pay | Admitting: Family Medicine

## 2018-09-19 ENCOUNTER — Encounter: Payer: Self-pay | Admitting: Rheumatology

## 2018-09-20 ENCOUNTER — Encounter (INDEPENDENT_AMBULATORY_CARE_PROVIDER_SITE_OTHER): Payer: Self-pay | Admitting: Family Medicine

## 2018-09-23 NOTE — Telephone Encounter (Signed)
Please address.  Thank you

## 2018-09-25 ENCOUNTER — Ambulatory Visit (INDEPENDENT_AMBULATORY_CARE_PROVIDER_SITE_OTHER): Payer: Commercial Managed Care - PPO | Admitting: Family Medicine

## 2018-10-28 NOTE — Progress Notes (Signed)
Office Visit Note  Patient: Jennifer [name]             Date of Birth: Apr 27, 1976           MRN: [id number]             PCP: Burnis Medin, MD Referring: Burnis Medin, MD Visit Date: 10/29/2018 Occupation: '@GUAROCC'$ @  Subjective:  Arthritis (Left ankle pain, Cimzia not covered by insurance, no meds for Arthritis since December, 2019)   History of Present Illness: Jennifer [name] is a 43 y.o. female with history of seropositive rheumatoid arthritis and osteoarthritis overlap.  She has not had Cimzia since December 2019 due to insurance issues.  She states last Sunday she developed pain and swelling in her left ankle and she is having difficulty walking. None Of the other joints are painful.  She denies any history of any injury.  She has difficulty putting weight on her left foot.  Right knee is a still swollen since she had arthroscopic surgery.  Activities of Daily Living:  Patient reports morning stiffness for 2 hours.   Patient Reports nocturnal pain.  Difficulty dressing/grooming: Denies Difficulty climbing stairs: Reports Difficulty getting out of chair: Reports Difficulty using hands for taps, buttons, cutlery, and/or writing: Denies  Review of Systems  Constitutional: Negative for fatigue, night sweats, weight gain and weight loss.  HENT: Negative for mouth sores, trouble swallowing, trouble swallowing, mouth dryness and nose dryness.   Eyes: Negative for pain, redness, visual disturbance and dryness.  Respiratory: Negative for cough, shortness of breath and difficulty breathing.   Cardiovascular: Positive for swelling in legs/feet. Negative for chest pain, palpitations, hypertension and irregular heartbeat.  Gastrointestinal: Negative for blood in stool, constipation and diarrhea.  Endocrine: Negative for excessive thirst and increased urination.  Genitourinary: Negative for difficulty urinating and vaginal dryness.  Musculoskeletal: Positive for arthralgias, joint pain,  joint swelling and morning stiffness. Negative for myalgias, muscle weakness, muscle tenderness and myalgias.  Skin: Negative for color change, rash, hair loss, skin tightness, ulcers and sensitivity to sunlight.  Allergic/Immunologic: Negative for susceptible to infections.  Neurological: Negative for dizziness, memory loss, night sweats and weakness.  Hematological: Negative for bruising/bleeding tendency and swollen glands.  Psychiatric/Behavioral: Positive for sleep disturbance. Negative for depressed mood. The patient is not nervous/anxious.     PMFS History:  Patient Active Problem List   Diagnosis Date Noted  . Primary osteoarthritis of both hands 02/04/2018  . Primary osteoarthritis of both knees 05/22/2017  . Incisional hernia 12/18/2016  . Leiomyoma of uterus 09/06/2016  . Fibroid, uterine   . Essential hypertension 05/25/2015  . Recurrent headache 05/25/2015  . Head lump 07/31/2014  . Edema 12/29/2013  . Baker's cyst, ruptured 12/25/2013  . Cough, persistent 12/12/2013  . Left leg swelling 12/12/2013  . Cough 12/12/2013  . High risk medication use 12/12/2013  . Recurrent periodic urticaria   . Rheumatoid arthritis ([place]) 08/28/2012  . CHRONIC LARYNGITIS 11/03/2010  . ALLERGIC RHINITIS 11/03/2010  . PARESTHESIA 07/28/2010  . DYSMENORRHEA 10/15/2007  . FIBROIDS, UTERUS 07/24/2007  . OBESITY 07/24/2007  . SLEEPLESSNESS 07/24/2007  . HEADACHE 07/24/2007    Past Medical History:  Diagnosis Date  . Acute otitis media 08/28/2012   improved  change to liquid medication   . Anxiety   . Breast abscess of female    Recurrent  . Depression   . Family history of adverse reaction to anesthesia    father hard time waking once (12/20/2016)  .  Fibroids    w/bleeding  . GERD (gastroesophageal reflux disease)   . History of blood transfusion    after surgery  . Hypertension   . Migraines    Dr. Orie Rout; "I have a few/month" (12/20/2016)  . Osteoarthritis   . Pill  esophagitis 08/28/2012   amoxicillin  by hx  disc plan nl voice except hoarse not drooling  close fu  if not getting better with plan stop aleve  liquid ibu onlyf necessary   . Recurrent periodic urticaria   . Rheumatoid arthritis ([place])    "55% of my body" (12/20/2016)  . Sleep apnea    wears cpap    Family History  Problem Relation Age of Onset  . Hypertension Mother   . Rheum arthritis Mother   . Hypertension Father   . Sleep apnea Father    Past Surgical History:  Procedure Laterality Date  . BREAST SURGERY     abcess under left breast   . CYST EXCISION Left 2014   "elbow"  . HERNIA REPAIR    . INCISION AND DRAINAGE BREAST ABSCESS Left 2003  . INCISIONAL HERNIA REPAIR N/A 12/18/2016   Procedure: REPAIR INCISIONAL HERNIA WITH MESH;  Surgeon: Georganna Skeans, MD;  Location: [facility];  Service: General;  Laterality: N/A;  . INSERTION OF MESH N/A 12/18/2016   Procedure: INSERTION OF MESH;  Surgeon: Georganna Skeans, MD;  Location: [facility];  Service: General;  Laterality: N/A;  . KNEE ARTHROSCOPY WITH MENISCAL REPAIR Right 06/17/2018   Procedure: RIGHT KNEE ARTHROSCOPY WITH MENISCAL REPAIR;  Surgeon: Vickey Huger, MD;  Location: [facility];  Service: Orthopedics;  Laterality: Right;  . MYOMECTOMY N/A 09/06/2016   Procedure: ABOMINAL MYOMECTOMY;  Surgeon: Governor Specking, MD;  Location: [facility];  Service: Gynecology;  Laterality: N/A;  . UTERINE FIBROID SURGERY     35 fibroids   Social History   Social History Narrative  . Not on file   Immunization History  Administered Date(s) Administered  . Influenza Split 08/21/2012  . Influenza,inj,Quad PF,6+ Mos 07/31/2014  . Pneumococcal Conjugate-13 08/28/2012     Objective: Vital Signs: BP (!) 145/98 (BP Location: Left Arm, Patient Position: Sitting, Cuff Size: Large)   Pulse 77   Resp 18   Ht '5\' 4"'$  (1.626 m)   Wt 291 lb 12.8 oz (132.4 kg)   LMP 10/15/2018   BMI 50.09 kg/m    Physical Exam Vitals signs and nursing note reviewed.    Constitutional:      Appearance: She is well-developed.  HENT:     Head: Normocephalic and atraumatic.  Eyes:     Conjunctiva/sclera: Conjunctivae normal.  Neck:     Musculoskeletal: Normal range of motion.  Cardiovascular:     Rate and Rhythm: Normal rate and regular rhythm.     Heart sounds: Normal heart sounds.  Pulmonary:     Effort: Pulmonary effort is normal.     Breath sounds: Normal breath sounds.  Abdominal:     General: Bowel sounds are normal.     Palpations: Abdomen is soft.  Lymphadenopathy:     Cervical: No cervical adenopathy.  Skin:    General: Skin is warm and dry.     Capillary Refill: Capillary refill takes less than 2 seconds.  Neurological:     Mental Status: She is alert and oriented to person, place, and time.  Psychiatric:        Behavior: Behavior normal.      Musculoskeletal Exam: C-spine thoracic and lumbar  spine good range of motion.  Shoulder joints, elbow joints, wrist joints, MCPs, PIPs and DIPs with good range of motion with no synovitis.  She had good range of motion of her hip joints.  She has warmth and swelling in her right knee joint.  She has warmth and swelling over her left ankle which was tender to touch.  None of the other joints are swollen.  CDAI Exam: CDAI Score: 2.2  Patient Global Assessment: 6 (mm); Provider Global Assessment: 6 (mm) Swollen: 2 ; Tender: 1  Joint Exam      Right  Left  Knee  Swollen      Ankle     Swollen Tender     Investigation: No additional findings.  Imaging: No results found.  Recent Labs: Lab Results  Component Value Date   WBC 5.1 06/11/2018   HGB 11.9 06/11/2018   PLT 310 06/11/2018   NA 138 06/11/2018   K 3.9 06/11/2018   CL 99 06/11/2018   CO2 24 06/11/2018   GLUCOSE 92 06/11/2018   BUN 9 06/11/2018   CREATININE 0.85 06/11/2018   BILITOT 0.3 06/11/2018   ALKPHOS 69 06/11/2018   AST 11 06/11/2018   ALT 9 06/11/2018   PROT 8.0 06/11/2018   ALBUMIN 3.8 06/11/2018   CALCIUM  8.8 06/11/2018   GFRAA 98 06/11/2018   QFTBGOLDPLUS NEGATIVE 01/28/2018    Speciality Comments: Prior therapy: Plaquenil (inadequate response)  Procedures:  No procedures performed Allergies: Lisinopril-hydrochlorothiazide   Assessment / Plan:     Visit Diagnoses: Rheumatoid arthritis involving multiple sites with positive rheumatoid factor (HCC) - +CCP, +HLA B27 -patient is having a flare with pain and swelling in her left ankle joint.  She has been off Cimzia since December.  She also has some swelling in her right knee joint since her arthroscopic surgery.  Her disease was very well controlled on Cimzia.  We will apply for Cimzia again.  In the meantime I will give her a prednisone taper starting at 20 mg and taper by 5 mg every 4 days.  Plan: predniSONE (DELTASONE) 5 MG tablet  High risk medication use - Cimzia subcutaneous monthly injection.  Last injection was in December 2019.  TB gold negative 01/28/18.  - Plan: CBC with Differential/Platelet, COMPLETE METABOLIC PANEL WITH GFR today and then every 3 months assuming that she will resume Cimzia.  Primary osteoarthritis of both hands-she currently does not have much discomfort.  Primary osteoarthritis of both knees-she continues to have pain and discomfort in her right knee joint.  She has some warmth and swelling on examination today.  S/P right knee arthroscopy  Essential hypertension -her blood pressure is a still elevated.  Have advised her to monitor blood pressure closely and follow-up with her PCP.  Orders: Orders Placed This Encounter  Procedures  . CBC with Differential/Platelet  . COMPLETE METABOLIC PANEL WITH GFR   Meds ordered this encounter  Medications  . predniSONE (DELTASONE) 5 MG tablet    Sig: Take 4 tablets (20 mg total) by mouth daily for 4 days, THEN 3 tablets (15 mg total) daily for 4 days, THEN 2 tablets (10 mg total) daily for 4 days, THEN 1 tablet (5 mg total) daily for 4 days, THEN 0.5 tablets (2.5 mg  total) daily for 4 days.    Dispense:  42 tablet    Refill:  0      Follow-Up Instructions: Return in about 3 months (around 01/27/2019) for Rheumatoid arthritis.   Abel Presto  Estanislado Pandy, MD  Note - This record has been created using Editor, commissioning.  Chart creation errors have been sought, but may not always  have been located. Such creation errors do not reflect on  the standard of medical care.

## 2018-10-29 ENCOUNTER — Ambulatory Visit (INDEPENDENT_AMBULATORY_CARE_PROVIDER_SITE_OTHER): Payer: 59 | Admitting: Rheumatology

## 2018-10-29 ENCOUNTER — Encounter: Payer: Self-pay | Admitting: Physician Assistant

## 2018-10-29 VITALS — BP 145/98 | HR 77 | Resp 18 | Ht 64.0 in | Wt 291.8 lb

## 2018-10-29 DIAGNOSIS — M19042 Primary osteoarthritis, left hand: Secondary | ICD-10-CM

## 2018-10-29 DIAGNOSIS — M170 Bilateral primary osteoarthritis of knee: Secondary | ICD-10-CM | POA: Diagnosis not present

## 2018-10-29 DIAGNOSIS — M0579 Rheumatoid arthritis with rheumatoid factor of multiple sites without organ or systems involvement: Secondary | ICD-10-CM

## 2018-10-29 DIAGNOSIS — Z79899 Other long term (current) drug therapy: Secondary | ICD-10-CM

## 2018-10-29 DIAGNOSIS — Z9889 Other specified postprocedural states: Secondary | ICD-10-CM

## 2018-10-29 DIAGNOSIS — I10 Essential (primary) hypertension: Secondary | ICD-10-CM

## 2018-10-29 DIAGNOSIS — M19041 Primary osteoarthritis, right hand: Secondary | ICD-10-CM | POA: Diagnosis not present

## 2018-10-29 MED ORDER — PREDNISONE 5 MG PO TABS: ORAL_TABLET | ORAL | 0 refills | Status: AC

## 2018-10-29 NOTE — Patient Instructions (Signed)
Standing Labs We placed an order today for your standing lab work.    Please come back and get your standing labs in May  We have open lab Monday through Friday from 8:30-11:30 AM and 1:30-4:00 PM  at the office of Dr. Bo Merino.   You may experience shorter wait times on Monday and Friday afternoons. The office is located at [address] [place], [place], [place] [address] No appointment is necessary.   Labs are drawn by [facility].  You may receive a bill from [facility] for your lab work.  If you wish to have your labs drawn at another location, please call the office 24 hours in advance to send orders.  If you have any questions regarding directions or hours of operation,  please call [phone].   Just as a reminder please drink plenty of water prior to coming for your lab work. Thanks!

## 2018-10-30 LAB — COMPLETE METABOLIC PANEL WITH GFR
AG RATIO: 1 (calc) (ref 1.0–2.5)
ALBUMIN MSPROF: 3.6 g/dL (ref 3.6–5.1)
ALKALINE PHOSPHATASE (APISO): 64 U/L (ref 31–125)
ALT: 9 U/L (ref 6–29)
AST: 14 U/L (ref 10–30)
BILIRUBIN TOTAL: 0.5 mg/dL (ref 0.2–1.2)
BUN: 10 mg/dL (ref 7–25)
CHLORIDE: 103 mmol/L (ref 98–110)
CO2: 24 mmol/L (ref 20–32)
Calcium: 8.9 mg/dL (ref 8.6–10.2)
Creat: 0.9 mg/dL (ref 0.50–1.10)
GFR, Est African American: 91 mL/min/{1.73_m2} (ref >=60)
GFR, Est Non African American: 78 mL/min/{1.73_m2} (ref >=60)
GLOBULIN: 3.7 g/dL (ref 1.9–3.7)
GLUCOSE: 88 mg/dL (ref 65–99)
POTASSIUM: 4.3 mmol/L (ref 3.5–5.3)
SODIUM: 137 mmol/L (ref 135–146)
TOTAL PROTEIN: 7.3 g/dL (ref 6.1–8.1)

## 2018-10-30 LAB — CBC WITH DIFFERENTIAL/PLATELET
Absolute Monocytes: 668 cells/uL (ref 200–950)
BASOS PCT: 0.4 %
Basophils Absolute: 20 cells/uL (ref 0–200)
EOS ABS: 107 {cells}/uL (ref 15–500)
Eosinophils Relative: 2.1 %
HCT: 34.7 % — ABNORMAL LOW (ref 35.0–45.0)
Hemoglobin: 11.5 g/dL — ABNORMAL LOW (ref 11.7–15.5)
Lymphs Abs: 1811 cells/uL (ref 850–3900)
MCH: 30.3 pg (ref 27.0–33.0)
MCHC: 33.1 g/dL (ref 32.0–36.0)
MCV: 91.3 fL (ref 80.0–100.0)
MONOS PCT: 13.1 %
MPV: 10.7 fL (ref 7.5–12.5)
NEUTROS PCT: 48.9 %
Neutro Abs: 2494 cells/uL (ref 1500–7800)
Platelets: 310 10*3/uL (ref 140–400)
RBC: 3.8 10*6/uL (ref 3.80–5.10)
RDW: 12.1 % (ref 11.0–15.0)
TOTAL LYMPHOCYTE: 35.5 %
WBC: 5.1 10*3/uL (ref 3.8–10.8)

## 2018-10-30 NOTE — Progress Notes (Signed)
Labs are stable.

## 2018-11-19 ENCOUNTER — Other Ambulatory Visit: Payer: Self-pay | Admitting: Internal Medicine

## 2018-11-25 ENCOUNTER — Encounter: Payer: Self-pay | Admitting: Rheumatology

## 2018-11-26 MED ORDER — PREDNISONE 5 MG PO TABS: ORAL_TABLET | ORAL | 0 refills | Status: DC

## 2018-11-26 NOTE — Telephone Encounter (Signed)
Her labs are up-to-date.  Okay to schedule for subcu in office Cimzia injection.  She will need labs every 3 months.  TB Gold will be due with the next labs.

## 2018-11-26 NOTE — Telephone Encounter (Signed)
Spoke with patient and advised per Dr. Estanislado Pandy she may start the Cimzia in office as she is up to date on her labs and we will update her TB gold at her next labs. Patient advised that per Dr. Estanislado Pandy we will send a prescription for a prednisone taper to the pharmacy. Patient verbalized understanding.

## 2018-11-26 NOTE — Telephone Encounter (Signed)
Patient states she is having inflammation and pain in her ankle and is requesting a prednisone taper. Patient has recently been approved for Cimzia in office. Her last Cimzia injection was 08/06/18. Please advise.

## 2019-01-13 ENCOUNTER — Encounter: Payer: Self-pay | Admitting: Rheumatology

## 2019-01-13 NOTE — Progress Notes (Signed)
Virtual Visit via Video Note  I connected with Jennifer [name] on 01/14/19 at 11:30 AM EDT by a video enabled telemedicine application and verified that I am speaking with the correct person using two identifiers.   I discussed the limitations of evaluation and management by telemedicine and the availability of in person appointments. The patient expressed understanding and agreed to proceed.  CC: pain in multiple joints  History of Present Illness: Patient is a 43 year old female with a history of seropositive rheumatoid arthritis and osteoarthritis.  Her last dose of Cimzia was in December 2019. She had difficulty with insurance coverage and has not heard anything recently. She is currently having a RA flare. She was started on a prednisone taper on 11/26/18, which helped temporarily.  She is taking ibuprofen for pain relief.  She is having pain in both hands, both wrist joints, both ankle joints, and both feet.  She is having right knee joint swelling.  She has difficulty walking long distances. She is having discomfort in both shoulder joints.  She uses voltaren gel topically PRN for pain relief. She is have severe pain and stiffness.      Review of Systems  Constitutional: Negative for fever and malaise/fatigue.  Eyes: Negative for photophobia, pain, discharge and redness.  Respiratory: Negative for cough, shortness of breath and wheezing.   Cardiovascular: Negative for chest pain and palpitations.  Gastrointestinal: Negative for blood in stool, constipation and diarrhea.  Genitourinary: Negative for dysuria.  Musculoskeletal: Positive for joint pain. Negative for back pain, myalgias and neck pain.       +Morning stiffness  +Joint swelling  Skin: Negative for rash.  Neurological: Negative for dizziness and headaches.  Psychiatric/Behavioral: Positive for depression. The patient is not nervous/anxious and does not have insomnia.    Observations/Objective:  Physical Exam   Constitutional: She is oriented to person, place, and time and well-developed, well-nourished, and in no distress.  HENT:  Head: Normocephalic and atraumatic.  Eyes: Conjunctivae are normal.  Pulmonary/Chest: Effort normal.  Neurological: She is alert and oriented to person, place, and time.  Psychiatric: Mood, memory, affect and judgment normal.   Tenderness of left MCP joints.   Tenderness of both wrist joints.   Painful ROM of both wrist joints. Incomplete fist formation. Left 3rd PIP and right 2nd and 3rd PIP joint tenderness .   Patient reports morning stiffness for 4-5 hours.   Patient reports nocturnal pain.  Difficulty dressing/grooming: Denies Difficulty climbing stairs: Reports Difficulty getting out of chair: Reports Difficulty using hands for taps, buttons, cutlery, and/or writing: Reports   Assessment and Plan: Rheumatoid arthritis involving multiple sites with positive rheumatoid factor (HCC) - +CCP, +HLA B27 -She is currently having a rheumatoid arthritis flare. She is having severe pain in both hands, both wrist joints, right knee joint, left ankle joint, and both feet.  She is having swelling in both hands, right knee joint, and the left ankle joint.  She has been using Voltaren gelt topically and taking Ibuprofen for pain relief.  Her last Cimzia sq injection was on 08/06/18.  She was started a prednisone taper in March, which provided temporary relief.  We will send in a prednisone taper starting at 20 mg and she will reduce by 5 mg every 4 days.  We will schedule a nurse visit ASAP to administer next Cimzia injection.  She will follow up in 2 months.   High risk medication use - Cimzia subcutaneous monthly injection.  Her last  injection was on 08/06/18. She has been approved to restart on Cimzia. We will schedule a nurse visit ASAP to restart her.  CBC and CMP were drawn on 10/29/18.  She will be due to update labs in May and every 3 months.  TB gold was negative on  01/28/18.  Future orders for CBC, CMP, and TB gold will be placed today.  She was advised to hold Cimzia injection anytime she has an infection and she can resume once the infection has cleared.  Social distancing practices and standard precautions recommended by CDC were discussed.   Primary osteoarthritis of both hands-She is having pain in both hands.  She has joint swelling and stiffness.  She has incomplete first formation bilaterally and limited ROM of both wrist joints.   Primary osteoarthritis of both knees-She is having pain in both knee joints, worse in the right knee joint.  She has right knee joint swelling.  She has been having difficulty walking long distances.   S/P right knee arthroscopy: She continues to have chronic right knee joint pain and swelling.    Follow Up Instructions: She will follow up in 2 months.  We will schedule a nurse visit for administration of Cimzia.  Standing orders are in place. A prednisone taper will be sent to the pharmacy.      I discussed the assessment and treatment plan with the patient. The patient was provided an opportunity to ask questions and all were answered. The patient agreed with the plan and demonstrated an understanding of the instructions.   The patient was advised to call back or seek an in-person evaluation if the symptoms worsen or if the condition fails to improve as anticipated.  I provided 25 minutes of non-face-to-face time during this encounter. Bo Merino, MD   Scribed by-  Hazel Sams, PA-C

## 2019-01-14 ENCOUNTER — Encounter: Payer: Self-pay | Admitting: Rheumatology

## 2019-01-14 ENCOUNTER — Other Ambulatory Visit: Payer: Self-pay

## 2019-01-14 ENCOUNTER — Telehealth (INDEPENDENT_AMBULATORY_CARE_PROVIDER_SITE_OTHER): Payer: 59 | Admitting: Rheumatology

## 2019-01-14 DIAGNOSIS — M19041 Primary osteoarthritis, right hand: Secondary | ICD-10-CM

## 2019-01-14 DIAGNOSIS — Z9889 Other specified postprocedural states: Secondary | ICD-10-CM

## 2019-01-14 DIAGNOSIS — Z79899 Other long term (current) drug therapy: Secondary | ICD-10-CM

## 2019-01-14 DIAGNOSIS — M19042 Primary osteoarthritis, left hand: Secondary | ICD-10-CM

## 2019-01-14 DIAGNOSIS — M0579 Rheumatoid arthritis with rheumatoid factor of multiple sites without organ or systems involvement: Secondary | ICD-10-CM

## 2019-01-14 DIAGNOSIS — M170 Bilateral primary osteoarthritis of knee: Secondary | ICD-10-CM

## 2019-01-14 DIAGNOSIS — I10 Essential (primary) hypertension: Secondary | ICD-10-CM

## 2019-01-14 MED ORDER — PREDNISONE 5 MG PO TABS: ORAL_TABLET | ORAL | 0 refills | Status: DC

## 2019-01-15 ENCOUNTER — Telehealth: Payer: Self-pay | Admitting: Rheumatology

## 2019-01-15 NOTE — Telephone Encounter (Signed)
I LMOM for patient to call ,and schedule her next follow up appt in 2 months with Dr. Estanislado Pandy.

## 2019-01-15 NOTE — Telephone Encounter (Signed)
-----   Message from [place] sent at 01/14/2019  3:28 PM EDT ----- Patient had virtual visit today with Dr. Estanislado Pandy. Please call to schedule 2 month follow up. Thanks!

## 2019-01-17 ENCOUNTER — Ambulatory Visit (INDEPENDENT_AMBULATORY_CARE_PROVIDER_SITE_OTHER): Payer: 59 | Admitting: *Deleted

## 2019-01-17 ENCOUNTER — Other Ambulatory Visit: Payer: Self-pay

## 2019-01-17 VITALS — BP 160/94 | HR 79

## 2019-01-17 DIAGNOSIS — M0579 Rheumatoid arthritis with rheumatoid factor of multiple sites without organ or systems involvement: Secondary | ICD-10-CM

## 2019-01-17 MED ORDER — CERTOLIZUMAB PEGOL 2 X 200 MG ~~LOC~~ KIT
400.0000 mg | PACK | Freq: Once | SUBCUTANEOUS | Status: AC
  Administered 2019-01-17: 400 mg via SUBCUTANEOUS

## 2019-01-17 NOTE — Progress Notes (Signed)
Patient in the office for Cimzia injection. Patient denies fever, infection or use of antibiotics. Patient was given injections in right and left lower abdomen. Patient tolerated injection well. Patient is due for next injection in 1 month.   Administrations This Visit    Certolizumab Pegol KIT 400 mg    Admin Date 01/17/2019 Action Given Dose 400 mg Route Subcutaneous Administered By Carole Binning, LPN

## 2019-01-21 DIAGNOSIS — G8929 Other chronic pain: Secondary | ICD-10-CM | POA: Insufficient documentation

## 2019-02-14 ENCOUNTER — Ambulatory Visit: Payer: Commercial Managed Care - PPO

## 2019-02-23 ENCOUNTER — Other Ambulatory Visit: Payer: Self-pay | Admitting: Internal Medicine

## 2019-02-26 ENCOUNTER — Telehealth: Payer: Self-pay | Admitting: *Deleted

## 2019-02-26 NOTE — Telephone Encounter (Signed)
Attempted to contact the patient and left message for patient to call the office.  

## 2019-02-28 ENCOUNTER — Other Ambulatory Visit: Payer: Self-pay | Admitting: Physician Assistant

## 2019-02-28 NOTE — Telephone Encounter (Signed)
Ok to refill 

## 2019-02-28 NOTE — Telephone Encounter (Signed)
Prescribed 01/21/2018. Please advise.

## 2019-03-14 ENCOUNTER — Other Ambulatory Visit: Payer: Self-pay

## 2019-03-14 ENCOUNTER — Ambulatory Visit (INDEPENDENT_AMBULATORY_CARE_PROVIDER_SITE_OTHER): Payer: 59 | Admitting: *Deleted

## 2019-03-14 VITALS — BP 149/92 | HR 82

## 2019-03-14 DIAGNOSIS — M0579 Rheumatoid arthritis with rheumatoid factor of multiple sites without organ or systems involvement: Secondary | ICD-10-CM | POA: Diagnosis not present

## 2019-03-14 DIAGNOSIS — Z79899 Other long term (current) drug therapy: Secondary | ICD-10-CM

## 2019-03-14 MED ORDER — CERTOLIZUMAB PEGOL 2 X 200 MG ~~LOC~~ KIT
400.0000 mg | PACK | Freq: Once | SUBCUTANEOUS | Status: AC
  Administered 2019-03-14: 400 mg via SUBCUTANEOUS

## 2019-03-14 NOTE — Progress Notes (Signed)
Patient in office for Cimzia injection. Patient denies fever, infection and use of antibiotic. Patient was given injections in right and left lower abdomen. Patient tolerated injections well. Patient will return in one month for next injection. Patient had her labs preformed today.   Administrations This Visit    Certolizumab Pegol KIT 400 mg    Admin Date 03/14/2019 Action Given Dose 400 mg Route Subcutaneous Administered By Carole Binning, LPN

## 2019-03-16 LAB — CBC WITH DIFFERENTIAL/PLATELET
Absolute Monocytes: 562 cells/uL (ref 200–950)
Basophils Absolute: 32 cells/uL (ref 0–200)
Basophils Relative: 0.6 %
Eosinophils Absolute: 119 cells/uL (ref 15–500)
Eosinophils Relative: 2.2 %
HCT: 39 % (ref 35.0–45.0)
Hemoglobin: 12.9 g/dL (ref 11.7–15.5)
Lymphs Abs: 1744 cells/uL (ref 850–3900)
MCH: 29.1 pg (ref 27.0–33.0)
MCHC: 33.1 g/dL (ref 32.0–36.0)
MCV: 88 fL (ref 80.0–100.0)
MPV: 11.1 fL (ref 7.5–12.5)
Monocytes Relative: 10.4 %
Neutro Abs: 2943 cells/uL (ref 1500–7800)
Neutrophils Relative %: 54.5 %
Platelets: 203 10*3/uL (ref 140–400)
RBC: 4.43 10*6/uL (ref 3.80–5.10)
RDW: 13.8 % (ref 11.0–15.0)
Total Lymphocyte: 32.3 %
WBC: 5.4 10*3/uL (ref 3.8–10.8)

## 2019-03-16 LAB — COMPLETE METABOLIC PANEL WITH GFR
AG Ratio: 1 (calc) (ref 1.0–2.5)
ALT: 11 U/L (ref 6–29)
AST: 17 U/L (ref 10–30)
Albumin: 3.8 g/dL (ref 3.6–5.1)
Alkaline phosphatase (APISO): 63 U/L (ref 31–125)
BUN: 12 mg/dL (ref 7–25)
CO2: 26 mmol/L (ref 20–32)
Calcium: 9.5 mg/dL (ref 8.6–10.2)
Chloride: 101 mmol/L (ref 98–110)
Creat: 0.84 mg/dL (ref 0.50–1.10)
GFR, Est African American: 99 mL/min/{1.73_m2} (ref >=60)
GFR, Est Non African American: 85 mL/min/{1.73_m2} (ref >=60)
Globulin: 4 g/dL (calc) — ABNORMAL HIGH (ref 1.9–3.7)
Glucose, Bld: 85 mg/dL (ref 65–99)
Potassium: 4.5 mmol/L (ref 3.5–5.3)
Sodium: 138 mmol/L (ref 135–146)
Total Bilirubin: 0.4 mg/dL (ref 0.2–1.2)
Total Protein: 7.8 g/dL (ref 6.1–8.1)

## 2019-03-16 LAB — QUANTIFERON-TB GOLD PLUS
Mitogen-NIL: >10 IU/mL
NIL: 0.02 IU/mL
QuantiFERON-TB Gold Plus: NEGATIVE
TB1-NIL: 0 IU/mL
TB2-NIL: 0 IU/mL

## 2019-03-17 NOTE — Progress Notes (Signed)
TB gold negative.  CBC WNL.  Globulin borderline elevated. Rest of CMP WNL

## 2019-04-11 ENCOUNTER — Other Ambulatory Visit: Payer: Self-pay

## 2019-04-11 ENCOUNTER — Ambulatory Visit (INDEPENDENT_AMBULATORY_CARE_PROVIDER_SITE_OTHER): Payer: 59 | Admitting: *Deleted

## 2019-04-11 VITALS — BP 166/99 | HR 60

## 2019-04-11 DIAGNOSIS — M0579 Rheumatoid arthritis with rheumatoid factor of multiple sites without organ or systems involvement: Secondary | ICD-10-CM

## 2019-04-11 MED ORDER — CERTOLIZUMAB PEGOL 2 X 200 MG ~~LOC~~ KIT
400.0000 mg | PACK | Freq: Once | SUBCUTANEOUS | Status: AC
  Administered 2019-04-11: 400 mg via SUBCUTANEOUS

## 2019-04-11 NOTE — Progress Notes (Signed)
Patient in office for Cimzia injection. Patient denies fever, infection and use of antibiotics. Patient was given injections in right and left lower abdomen. Patient tolerated injections well. Patient will return in one month for next injection.   Administrations This Visit    Certolizumab Pegol KIT 400 mg    Admin Date 04/11/2019 Action Given Dose 400 mg Route Subcutaneous Administered By Carole Binning, LPN

## 2019-05-09 ENCOUNTER — Ambulatory Visit: Payer: 59

## 2019-05-12 ENCOUNTER — Ambulatory Visit (INDEPENDENT_AMBULATORY_CARE_PROVIDER_SITE_OTHER): Payer: 59 | Admitting: *Deleted

## 2019-05-12 ENCOUNTER — Other Ambulatory Visit: Payer: Self-pay

## 2019-05-12 VITALS — BP 166/100 | HR 76

## 2019-05-12 DIAGNOSIS — M0579 Rheumatoid arthritis with rheumatoid factor of multiple sites without organ or systems involvement: Secondary | ICD-10-CM

## 2019-05-12 MED ORDER — CERTOLIZUMAB PEGOL 2 X 200 MG ~~LOC~~ KIT
400.0000 mg | PACK | Freq: Once | SUBCUTANEOUS | Status: AC
  Administered 2019-05-12: 400 mg via SUBCUTANEOUS

## 2019-05-12 NOTE — Progress Notes (Signed)
Patient in office for Cimzia injection. Patient denies fever, infection and use of antibiotics. Patient given injection in right and left thigh. Patient tolerated injection well. Patient will return in one month for next injection and labs.  Administrations This Visit    Certolizumab Pegol KIT 400 mg    Admin Date 05/12/2019 Action Given Dose 400 mg Route Subcutaneous Administered By Carole Binning, LPN

## 2019-06-09 ENCOUNTER — Other Ambulatory Visit: Payer: Self-pay

## 2019-06-09 ENCOUNTER — Ambulatory Visit (INDEPENDENT_AMBULATORY_CARE_PROVIDER_SITE_OTHER): Payer: 59 | Admitting: *Deleted

## 2019-06-09 VITALS — BP 162/96 | HR 79

## 2019-06-09 DIAGNOSIS — M0579 Rheumatoid arthritis with rheumatoid factor of multiple sites without organ or systems involvement: Secondary | ICD-10-CM | POA: Diagnosis not present

## 2019-06-09 MED ORDER — CERTOLIZUMAB PEGOL 2 X 200 MG ~~LOC~~ KIT
400.0000 mg | PACK | Freq: Once | SUBCUTANEOUS | Status: AC
  Administered 2019-06-09: 400 mg via SUBCUTANEOUS

## 2019-06-09 NOTE — Progress Notes (Signed)
Patient in office for a Cimzia injection. Patient denies fever, infection and use of antibioticus. Patient given injection in right and left lower abdomen. Patient tolerated injection well. Patient will return in 1 month for next injection.   Administrations This Visit    Certolizumab Pegol KIT 400 mg    Admin Date 06/09/2019 Action Given Dose 400 mg Route Subcutaneous Administered By Carole Binning, LPN

## 2019-06-10 ENCOUNTER — Encounter: Payer: Self-pay | Admitting: Rheumatology

## 2019-06-10 NOTE — Telephone Encounter (Signed)
Patient scheduled for 06/12/19 for an appointment to discuss treatment options.

## 2019-06-11 NOTE — Progress Notes (Signed)
Office Visit Note  Patient: Jennifer [name]             Date of Birth: 10/31/1975           MRN: [id number]             PCP: Burnis Medin, MD Referring: Burnis Medin, MD Visit Date: 06/12/2019 Occupation: _0 @  Subjective:  Right wrist pain   History of Present Illness: Jennifer [name] is a 43 y.o. female with history of seropositive rheumatoid arthritis and osteoarthritis.  She is on Cimzia 400 mg sq every month.  She has not missed any doses of Cimzia recently.  She has been having frequent flares in the right wrist joint.   She has been having swelling and increased joint stiffness.  She has tried taking tylenol for pain relief.  She has also been using hot and cold compresses daily.  She has chronic right knee joint pain.  She had meniscal tear repair by Dr. Ronnie Derby in the past, but she still unable to straighten the right knee.   Activities of Daily Living:  Patient reports joint stiffness all day  Patient Reports nocturnal pain.  Difficulty dressing/grooming: Denies Difficulty climbing stairs: Reports Difficulty getting out of chair: Denies Difficulty using hands for taps, buttons, cutlery, and/or writing: Reports  Review of Systems  Constitutional: Positive for fatigue.  HENT: Negative for mouth sores, mouth dryness and nose dryness.   Eyes: Negative for pain, visual disturbance and dryness.  Respiratory: Negative for cough, hemoptysis, shortness of breath and difficulty breathing.   Cardiovascular: Negative for chest pain, palpitations, hypertension and swelling in legs/feet.  Gastrointestinal: Negative for blood in stool, constipation and diarrhea.  Endocrine: Negative for increased urination.  Genitourinary: Negative for painful urination.  Musculoskeletal: Positive for arthralgias, joint pain, joint swelling and morning stiffness. Negative for myalgias, muscle weakness, muscle tenderness and myalgias.  Skin: Negative for color change, pallor, rash, hair loss,  nodules/bumps, skin tightness, ulcers and sensitivity to sunlight.  Allergic/Immunologic: Negative for susceptible to infections.  Neurological: Negative for dizziness, numbness, headaches and weakness.  Hematological: Negative for swollen glands.  Psychiatric/Behavioral: Positive for depressed mood. Negative for sleep disturbance. The patient is nervous/anxious.     PMFS History:  Patient Active Problem List   Diagnosis Date Noted   Primary osteoarthritis of both hands 02/04/2018   Primary osteoarthritis of both knees 05/22/2017   Incisional hernia 12/18/2016   Leiomyoma of uterus 09/06/2016   Fibroid, uterine    Essential hypertension 05/25/2015   Recurrent headache 05/25/2015   Head lump 07/31/2014   Edema 12/29/2013   Baker's cyst, ruptured 12/25/2013   Cough, persistent 12/12/2013   Left leg swelling 12/12/2013   Cough 12/12/2013   High risk medication use 12/12/2013   Recurrent periodic urticaria    Rheumatoid arthritis ([place]) 08/28/2012   CHRONIC LARYNGITIS 11/03/2010   ALLERGIC RHINITIS 11/03/2010   PARESTHESIA 07/28/2010   DYSMENORRHEA 10/15/2007   FIBROIDS, UTERUS 07/24/2007   OBESITY 07/24/2007   SLEEPLESSNESS 07/24/2007   HEADACHE 07/24/2007    Past Medical History:  Diagnosis Date   Acute otitis media 08/28/2012   improved  change to liquid medication    Anxiety    Breast abscess of female    Recurrent   Depression    Family history of adverse reaction to anesthesia    father hard time waking once (12/20/2016)   Fibroids    w/bleeding   GERD (gastroesophageal reflux disease)    History of blood  transfusion    after surgery   Hypertension    Migraines    Dr. Orie Rout; "I have a few/month" (12/20/2016)   Osteoarthritis    Pill esophagitis 08/28/2012   amoxicillin  by hx  disc plan nl voice except hoarse not drooling  close fu  if not getting better with plan stop aleve  liquid ibu onlyf necessary    Recurrent  periodic urticaria    Rheumatoid arthritis ([place])    "55% of my body" (12/20/2016)   Sleep apnea    wears cpap    Family History  Problem Relation Age of Onset   Hypertension Mother    Rheum arthritis Mother    Hypertension Father    Sleep apnea Father    Past Surgical History:  Procedure Laterality Date   BREAST SURGERY     abcess under left breast    CYST EXCISION Left 2014   "elbow"   HERNIA REPAIR     INCISION AND DRAINAGE BREAST ABSCESS Left 2003   INCISIONAL HERNIA REPAIR N/A 12/18/2016   Procedure: REPAIR INCISIONAL HERNIA WITH MESH;  Surgeon: Georganna Skeans, MD;  Location: [facility];  Service: General;  Laterality: N/A;   INSERTION OF MESH N/A 12/18/2016   Procedure: INSERTION OF MESH;  Surgeon: Georganna Skeans, MD;  Location: [facility];  Service: General;  Laterality: N/A;   KNEE ARTHROSCOPY WITH MENISCAL REPAIR Right 06/17/2018   Procedure: RIGHT KNEE ARTHROSCOPY WITH MENISCAL REPAIR;  Surgeon: Vickey Huger, MD;  Location: [facility];  Service: Orthopedics;  Laterality: Right;   MYOMECTOMY N/A 09/06/2016   Procedure: Lendell Caprice;  Surgeon: Governor Specking, MD;  Location: [facility];  Service: Gynecology;  Laterality: N/A;   UTERINE FIBROID SURGERY     35 fibroids   Social History   Social History Narrative   Not on file   Immunization History  Administered Date(s) Administered   Influenza Split 08/21/2012   Influenza,inj,Quad PF,6+ Mos 07/31/2014   Pneumococcal Conjugate-13 08/28/2012     Objective: Vital Signs: BP 135/90 (BP Location: Left Arm, Patient Position: Sitting, Cuff Size: Large)    Pulse 78    Resp 15    Ht _0  (1.626 m)    Wt 285 lb (129.3 kg)    BMI 48.92 kg/m    Physical Exam Vitals signs and nursing note reviewed.  Constitutional:      Appearance: She is well-developed.  HENT:     Head: Normocephalic and atraumatic.  Eyes:     Conjunctiva/sclera: Conjunctivae normal.  Neck:     Musculoskeletal: Normal range of motion.    Cardiovascular:     Rate and Rhythm: Normal rate and regular rhythm.     Heart sounds: Normal heart sounds.  Pulmonary:     Effort: Pulmonary effort is normal.     Breath sounds: Normal breath sounds.  Abdominal:     General: Bowel sounds are normal.     Palpations: Abdomen is soft.  Lymphadenopathy:     Cervical: No cervical adenopathy.  Skin:    General: Skin is warm and dry.     Capillary Refill: Capillary refill takes less than 2 seconds.  Neurological:     Mental Status: She is alert and oriented to person, place, and time.  Psychiatric:        Behavior: Behavior normal.      Musculoskeletal Exam: C-spine, thoracic spine, and lumbar spine good ROM.  No midline spinal tenderness.  No SI joint tenderness.  Shoulder joints, elbow joints,  MCPs, PIPs, and DIPs good ROM with no synovitis.  Right wrist limited ROM.  Tenderness and synovitis of right wrist joint. Hip joints good ROM.  Right knee has limited extension and warmth.  Left knee good ROM with no discomfort.  Ankle joints, MTPs, PIPs, and DIPs good ROM with no synovitis.   CDAI Exam: CDAI Score: 3.6  Patient Global: 8 mm; Provider Global: 8 mm Swollen: 1 ; Tender: 1  Joint Exam      Right  Left  Wrist  Swollen Tender        Investigation: No additional findings.  Imaging: No results found.  Recent Labs: Lab Results  Component Value Date   WBC 5.4 03/14/2019   HGB 12.9 03/14/2019   PLT 203 03/14/2019   NA 138 03/14/2019   K 4.5 03/14/2019   CL 101 03/14/2019   CO2 26 03/14/2019   GLUCOSE 85 03/14/2019   BUN 12 03/14/2019   CREATININE 0.84 03/14/2019   BILITOT 0.4 03/14/2019   ALKPHOS 69 06/11/2018   AST 17 03/14/2019   ALT 11 03/14/2019   PROT 7.8 03/14/2019   ALBUMIN 3.8 06/11/2018   CALCIUM 9.5 03/14/2019   GFRAA 99 03/14/2019   QFTBGOLDPLUS NEGATIVE 03/14/2019    Speciality Comments: Prior therapy: Plaquenil (inadequate response)  Procedures:  Medium Joint Inj: R intercarpal on 06/12/2019  11:37 AM Indications: pain and joint swelling Details: 27 G 1.5 in needle, ultrasound-guided dorsal approach Medications: 1 mL lidocaine 1 %; 30 mg triamcinolone acetonide 40 MG/ML Aspirate: 0 mL Outcome: tolerated well, no immediate complications Procedure, treatment alternatives, risks and benefits explained, specific risks discussed. Consent was given by the patient. Immediately prior to procedure a time out was called to verify the correct patient, procedure, equipment, support staff and site/side marked as required. Patient was prepped and draped in the usual sterile fashion.     Allergies: Lisinopril-hydrochlorothiazide   Assessment / Plan:     Visit Diagnoses: Rheumatoid arthritis involving multiple sites with positive rheumatoid factor (HCC) - +CCP, +HLA B27: She presents today with right wrist joint tenderness and synovitis.  She has limited range of motion with discomfort.  She is been having recurrent flares in the right wrist joint.  She has warmth and limited extension of the right knee joint on exam.  She had a torn meniscus in the past which was repaired by Dr. Lorre Nick.  She continues to have chronic pain in the right knee joint.  She has no other joint pain or joint swelling at this time.  She is on Cimzia 400 mg subcutaneous injections once monthly.  She has not missed any doses recently.  We discussed adding on methotrexate to her current treatment regimen.  Indications, contraindications, potential side effects of methotrexate were discussed.  She is planning on following up with her gynecologist to discuss the use of the NuvaRing for contraception.  She will start on methotrexate 6 tablets by mouth once weekly for 2 weeks and if labs are stable at that time she will increase to 8 tablets by mouth once weekly.  She will take folic acid 2 mg by mouth daily.  She will return for lab work tomorrow, 2 weeks x2, 2 months, and then every 3 months.  A prednisone taper starting at 20 mg tapering  by 5 mg every 4 days was sent to the pharmacy.  She will continue receiving Cimzia injections on a monthly basis.  She will follow-up in the office in 1 month.  Drug Counseling  TB Gold:  Negative 03/14/2019 Hepatitis panel: Future order placed today.  She plans on having lab work drawn tomorrow.  Chest-xray: No acute abnormalities on 12/12/2013  Contraception: She will be following up with her gynecologist to discuss a prescription for NuvaRing.  She reports in the past she has seen fertility specialist and according to the patient she only has a 1% chance of getting pregnant.  She is not trying to get pregnant at this time.  She uses condoms when she is sexually active.  We will not send in a prescription for methotrexate until she has started on the NuvaRing.  Alcohol use: Discussed the importance of avoiding alcohol use.  Patient was counseled on the purpose, proper use, and adverse effects of methotrexate including nausea, infection, and signs and symptoms of pneumonitis.  Reviewed instructions with patient to take methotrexate weekly along with folic acid daily.  Discussed the importance of frequent monitoring of kidney and liver function and blood counts, and provided patient with standing lab instructions.  Counseled patient to avoid NSAIDs and alcohol while on methotrexate.  Provided patient with educational materials on methotrexate and answered all questions.  Advised patient to get annual influenza vaccine and to get a pneumococcal vaccine if patient has not already had one.  Patient voiced understanding.  Patient consented to methotrexate use.  Will upload into chart.    High risk medication use - Cimzia 400 mg subcutaneous monthly injection. last injection: 06/09/2019.  She will be starting on methotrexate 6 tablets by mouth once weekly for 2 weeks and if labs are stable she will increase to 8 tablets by mouth once weekly.  She will take folic acid 2 mg by mouth daily.  We will wait to send  in a prescription until her lab work is updated until she is started on the NuvaRing.  She will notify us once she starts on contraception.  She will return for lab work 2 weeks after starting methotrexate, 2 months, then every 3 months.  Pain in right wrist: She presents today with right wrist pain and synovitis.  She has limited range of motion with discomfort.  She is been having recurrent flares in the right wrist joint.  A right wrist cortisone injection was performed today.  She tolerated the procedure well.  The procedure note was completed above.  She was given a prescription for prednisone starting at 20 mg tapering by 5 mg every 4 days.  We will be adding on methotrexate to her current treatment regimen of Cimzia subcu injections every month.  Primary osteoarthritis of both hands: She has no tenderness or synovitis on exam.  Joint protection and muscle strengthening were discussed.   Primary osteoarthritis of both knees: She has chronic right knee joint pain.  She has warmth and limited extension on exam.  She has no left knee joint pain at this time.  No warmth or effusion of the left knee joint on exam.   S/P right knee arthroscopy: She has limited extension and warmth on exam.    Essential hypertension  Orders: Orders Placed This Encounter  Procedures   Medium Joint Inj   Hepatitis panel, acute   Meds ordered this encounter  Medications   predniSONE (DELTASONE) 5 MG tablet    Sig: Take 4 tablets by mouth daily x4 days, 3 tablets by mouth daily x4 days, 2 tablets by mouth daily x4 days, 1 tablet by mouth daily x4 days    Dispense:  40 tablet  Refill:  0    Face-to-face time spent with patient was 30 minutes. Greater than 50% of time was spent in counseling and coordination of care.  Follow-Up Instructions: Return in about 4 weeks (around 07/10/2019) for Rheumatoid arthritis, Osteoarthritis.   Ofilia Neas, PA-C   I examined and evaluated the patient with Hazel Sams  PA.  Patient is having a flare of rheumatoid arthritis.  She has severe pain and swelling in her right wrist joint which is injected after obtaining an informed consent.  The procedure is described above.  She tolerated the procedure well.  Postprocedure instructions were given.  She will contact her GYN to start on contraceptive.  After that she will contact us to get the methotrexate prescription.  Informed consent was obtained today.  A prednisone taper was also given.  Side effects of medications were discussed at length.  The plan of care was discussed as noted above.  Bo Merino, MD  Note - This record has been created using Editor, commissioning.  Chart creation errors have been sought, but may not always  have been located. Such creation errors do not reflect on  the standard of medical care.

## 2019-06-12 ENCOUNTER — Other Ambulatory Visit: Payer: Self-pay

## 2019-06-12 ENCOUNTER — Encounter: Payer: Self-pay | Admitting: Rheumatology

## 2019-06-12 ENCOUNTER — Telehealth: Payer: Self-pay

## 2019-06-12 ENCOUNTER — Ambulatory Visit (INDEPENDENT_AMBULATORY_CARE_PROVIDER_SITE_OTHER): Payer: 59 | Admitting: Rheumatology

## 2019-06-12 VITALS — BP 135/90 | HR 78 | Resp 15 | Ht 64.0 in | Wt 285.0 lb

## 2019-06-12 DIAGNOSIS — M25531 Pain in right wrist: Secondary | ICD-10-CM | POA: Diagnosis not present

## 2019-06-12 DIAGNOSIS — M170 Bilateral primary osteoarthritis of knee: Secondary | ICD-10-CM

## 2019-06-12 DIAGNOSIS — M19041 Primary osteoarthritis, right hand: Secondary | ICD-10-CM | POA: Diagnosis not present

## 2019-06-12 DIAGNOSIS — Z9889 Other specified postprocedural states: Secondary | ICD-10-CM

## 2019-06-12 DIAGNOSIS — Z79899 Other long term (current) drug therapy: Secondary | ICD-10-CM | POA: Diagnosis not present

## 2019-06-12 DIAGNOSIS — I10 Essential (primary) hypertension: Secondary | ICD-10-CM

## 2019-06-12 DIAGNOSIS — M0579 Rheumatoid arthritis with rheumatoid factor of multiple sites without organ or systems involvement: Secondary | ICD-10-CM

## 2019-06-12 DIAGNOSIS — M19042 Primary osteoarthritis, left hand: Secondary | ICD-10-CM

## 2019-06-12 MED ORDER — LIDOCAINE HCL 1 % IJ SOLN
1.0000 mL | INTRAMUSCULAR | Status: AC | PRN
  Administered 2019-06-12: 1 mL

## 2019-06-12 MED ORDER — PREDNISONE 5 MG PO TABS: ORAL_TABLET | ORAL | 0 refills | Status: DC

## 2019-06-12 MED ORDER — TRIAMCINOLONE ACETONIDE 40 MG/ML IJ SUSP
30.0000 mg | INTRAMUSCULAR | Status: AC | PRN
  Administered 2019-06-12: 30 mg via INTRA_ARTICULAR

## 2019-06-12 NOTE — Telephone Encounter (Signed)
Patient will contact her gynecologist and will be getting the Nuvaring, patient will let us know. Patient was consented on MTX and the plan is to add MTX and folic acid to current regimen.

## 2019-06-12 NOTE — Patient Instructions (Addendum)
Standing Labs We placed an order today for your standing lab work.    Please come back and get your standing labs in 2 weeks x2, 2 months, then every 3 months   We have open lab daily Monday through Thursday from 8:30-12:30 PM and 1:30-4:30 PM and Friday from 8:30-12:30 PM and 1:30 -4:00 PM at the office of Dr. Bo Merino.   You may experience shorter wait times on Monday and Friday afternoons. The office is located at [address] [place], [place], [place] [address] No appointment is necessary.   Labs are drawn by [facility].  You may receive a bill from [facility] for your lab work.  If you wish to have your labs drawn at another location, please call the office 24 hours in advance to send orders.  If you have any questions regarding directions or hours of operation,  please call [phone].   Just as a reminder please drink plenty of water prior to coming for your lab work. Thanks!   Methotrexate tablets What is this medicine? METHOTREXATE (METH oh TREX ate) is a chemotherapy drug used to treat cancer including breast cancer, leukemia, and lymphoma. This medicine can also be used to treat psoriasis and certain kinds of arthritis. This medicine may be used for other purposes; ask your health care provider or pharmacist if you have questions. COMMON BRAND NAME(S): Rheumatrex, Trexall What should I tell my health care provider before I take this medicine? They need to know if you have any of these conditions:  fluid in the stomach area or lungs  if you often drink alcohol  infection or immune system problems  kidney disease or on hemodialysis  liver disease  low blood counts, like low white cell, platelet, or red cell counts  lung disease  radiation therapy  stomach ulcers  ulcerative colitis  an unusual or allergic reaction to methotrexate, other medicines, foods, dyes, or preservatives  pregnant or trying to get pregnant  breast-feeding How should I use  this medicine? Take this medicine by mouth with a glass of water. Follow the directions on the prescription label. Take your medicine at regular intervals. Do not take it more often than directed. Do not stop taking except on your doctor's advice. Make sure you know why you are taking this medicine and how often you should take it. If this medicine is used for a condition that is not cancer, like arthritis or psoriasis, it should be taken weekly, NOT daily. Taking this medicine more often than directed can cause serious side effects, even death. Talk to your healthcare provider about safe handling and disposal of this medicine. You may need to take special precautions. Talk to your pediatrician regarding the use of this medicine in children. While this drug may be prescribed for selected conditions, precautions do apply. Overdosage: If you think you have taken too much of this medicine contact a poison control center or emergency room at once. NOTE: This medicine is only for you. Do not share this medicine with others. What if I miss a dose? If you miss a dose, talk with your doctor or health care professional. Do not take double or extra doses. What may interact with this medicine? This medicine may interact with the following medication:  acitretin  aspirin and aspirin-like medicines including salicylates  azathioprine  certain antibiotics like penicillins, tetracycline, and chloramphenicol  cyclosporine  gold  hydroxychloroquine  live virus vaccines  NSAIDs, medicines for pain and inflammation, like ibuprofen or naproxen  other cytotoxic agents  penicillamine  phenylbutazone  phenytoin  probenecid  retinoids such as isotretinoin and tretinoin  steroid medicines like prednisone or cortisone  sulfonamides like sulfasalazine and trimethoprim/sulfamethoxazole  theophylline This list may not describe all possible interactions. Give your health care provider a list of all  the medicines, herbs, non-prescription drugs, or dietary supplements you use. Also tell them if you smoke, drink alcohol, or use illegal drugs. Some items may interact with your medicine. What should I watch for while using this medicine? Avoid alcoholic drinks. This medicine can make you more sensitive to the sun. Keep out of the sun. If you cannot avoid being in the sun, wear protective clothing and use sunscreen. Do not use sun lamps or tanning beds/booths. You may need blood work done while you are taking this medicine. Call your doctor or health care professional for advice if you get a fever, chills or sore throat, or other symptoms of a cold or flu. Do not treat yourself. This drug decreases your body's ability to fight infections. Try to avoid being around people who are sick. This medicine may increase your risk to bruise or bleed. Call your doctor or health care professional if you notice any unusual bleeding. Check with your doctor or health care professional if you get an attack of severe diarrhea, nausea and vomiting, or if you sweat a lot. The loss of too much body fluid can make it dangerous for you to take this medicine. Talk to your doctor about your risk of cancer. You may be more at risk for certain types of cancers if you take this medicine. Both men and women must use effective birth control with this medicine. Do not become pregnant while taking this medicine or until at least 1 normal menstrual cycle has occurred after stopping it. Women should inform their doctor if they wish to become pregnant or think they might be pregnant. Men should not father a child while taking this medicine and for 3 months after stopping it. There is a potential for serious side effects to an unborn child. Talk to your health care professional or pharmacist for more information. Do not breast-feed an infant while taking this medicine. What side effects may I notice from receiving this medicine? Side  effects that you should report to your doctor or health care professional as soon as possible:  allergic reactions like skin rash, itching or hives, swelling of the face, lips, or tongue  breathing problems or shortness of breath  diarrhea  dry, nonproductive cough  low blood counts - this medicine may decrease the number of white blood cells, red blood cells and platelets. You may be at increased risk for infections and bleeding.  mouth sores  redness, blistering, peeling or loosening of the skin, including inside the mouth  signs of infection - fever or chills, cough, sore throat, pain or trouble passing urine  signs and symptoms of bleeding such as bloody or black, tarry stools; red or dark-brown urine; spitting up blood or brown material that looks like coffee grounds; red spots on the skin; unusual bruising or bleeding from the eye, gums, or nose  signs and symptoms of kidney injury like trouble passing urine or change in the amount of urine  signs and symptoms of liver injury like dark yellow or brown urine; general ill feeling or flu-like symptoms; light-colored stools; loss of appetite; nausea; right upper belly pain; unusually weak or tired; yellowing of the eyes or skin Side effects that  usually do not require medical attention (report to your doctor or health care professional if they continue or are bothersome):  dizziness  hair loss  tiredness  upset stomach  vomiting This list may not describe all possible side effects. Call your doctor for medical advice about side effects. You may report side effects to FDA at 1-800-FDA-1088. Where should I keep my medicine? Keep out of the reach of children. Store at room temperature between 20 and 25 degrees C (68 and 77 degrees F). Protect from light. Throw away any unused medicine after the expiration date. NOTE: This sheet is a summary. It may not cover all possible information. If you have questions about this medicine,  talk to your doctor, pharmacist, or health care provider.  2020 Elsevier/Gold Standard (2017-04-26 13:38:43)  Methotrexate tablets What is this medicine? METHOTREXATE (METH oh TREX ate) is a chemotherapy drug used to treat cancer including breast cancer, leukemia, and lymphoma. This medicine can also be used to treat psoriasis and certain kinds of arthritis. This medicine may be used for other purposes; ask your health care provider or pharmacist if you have questions. COMMON BRAND NAME(S): Rheumatrex, Trexall What should I tell my health care provider before I take this medicine? They need to know if you have any of these conditions:  fluid in the stomach area or lungs  if you often drink alcohol  infection or immune system problems  kidney disease or on hemodialysis  liver disease  low blood counts, like low white cell, platelet, or red cell counts  lung disease  radiation therapy  stomach ulcers  ulcerative colitis  an unusual or allergic reaction to methotrexate, other medicines, foods, dyes, or preservatives  pregnant or trying to get pregnant  breast-feeding How should I use this medicine? Take this medicine by mouth with a glass of water. Follow the directions on the prescription label. Take your medicine at regular intervals. Do not take it more often than directed. Do not stop taking except on your doctor's advice. Make sure you know why you are taking this medicine and how often you should take it. If this medicine is used for a condition that is not cancer, like arthritis or psoriasis, it should be taken weekly, NOT daily. Taking this medicine more often than directed can cause serious side effects, even death. Talk to your healthcare provider about safe handling and disposal of this medicine. You may need to take special precautions. Talk to your pediatrician regarding the use of this medicine in children. While this drug may be prescribed for selected conditions,  precautions do apply. Overdosage: If you think you have taken too much of this medicine contact a poison control center or emergency room at once. NOTE: This medicine is only for you. Do not share this medicine with others. What if I miss a dose? If you miss a dose, talk with your doctor or health care professional. Do not take double or extra doses. What may interact with this medicine? This medicine may interact with the following medication:  acitretin  aspirin and aspirin-like medicines including salicylates  azathioprine  certain antibiotics like penicillins, tetracycline, and chloramphenicol  cyclosporine  gold  hydroxychloroquine  live virus vaccines  NSAIDs, medicines for pain and inflammation, like ibuprofen or naproxen  other cytotoxic agents  penicillamine  phenylbutazone  phenytoin  probenecid  retinoids such as isotretinoin and tretinoin  steroid medicines like prednisone or cortisone  sulfonamides like sulfasalazine and trimethoprim/sulfamethoxazole  theophylline This list may not describe  all possible interactions. Give your health care provider a list of all the medicines, herbs, non-prescription drugs, or dietary supplements you use. Also tell them if you smoke, drink alcohol, or use illegal drugs. Some items may interact with your medicine. What should I watch for while using this medicine? Avoid alcoholic drinks. This medicine can make you more sensitive to the sun. Keep out of the sun. If you cannot avoid being in the sun, wear protective clothing and use sunscreen. Do not use sun lamps or tanning beds/booths. You may need blood work done while you are taking this medicine. Call your doctor or health care professional for advice if you get a fever, chills or sore throat, or other symptoms of a cold or flu. Do not treat yourself. This drug decreases your body's ability to fight infections. Try to avoid being around people who are sick. This medicine  may increase your risk to bruise or bleed. Call your doctor or health care professional if you notice any unusual bleeding. Check with your doctor or health care professional if you get an attack of severe diarrhea, nausea and vomiting, or if you sweat a lot. The loss of too much body fluid can make it dangerous for you to take this medicine. Talk to your doctor about your risk of cancer. You may be more at risk for certain types of cancers if you take this medicine. Both men and women must use effective birth control with this medicine. Do not become pregnant while taking this medicine or until at least 1 normal menstrual cycle has occurred after stopping it. Women should inform their doctor if they wish to become pregnant or think they might be pregnant. Men should not father a child while taking this medicine and for 3 months after stopping it. There is a potential for serious side effects to an unborn child. Talk to your health care professional or pharmacist for more information. Do not breast-feed an infant while taking this medicine. What side effects may I notice from receiving this medicine? Side effects that you should report to your doctor or health care professional as soon as possible:  allergic reactions like skin rash, itching or hives, swelling of the face, lips, or tongue  breathing problems or shortness of breath  diarrhea  dry, nonproductive cough  low blood counts - this medicine may decrease the number of white blood cells, red blood cells and platelets. You may be at increased risk for infections and bleeding.  mouth sores  redness, blistering, peeling or loosening of the skin, including inside the mouth  signs of infection - fever or chills, cough, sore throat, pain or trouble passing urine  signs and symptoms of bleeding such as bloody or black, tarry stools; red or dark-brown urine; spitting up blood or brown material that looks like coffee grounds; red spots on the  skin; unusual bruising or bleeding from the eye, gums, or nose  signs and symptoms of kidney injury like trouble passing urine or change in the amount of urine  signs and symptoms of liver injury like dark yellow or brown urine; general ill feeling or flu-like symptoms; light-colored stools; loss of appetite; nausea; right upper belly pain; unusually weak or tired; yellowing of the eyes or skin Side effects that usually do not require medical attention (report to your doctor or health care professional if they continue or are bothersome):  dizziness  hair loss  tiredness  upset stomach  vomiting This list may not describe all  possible side effects. Call your doctor for medical advice about side effects. You may report side effects to FDA at 1-800-FDA-1088. Where should I keep my medicine? Keep out of the reach of children. Store at room temperature between 20 and 25 degrees C (68 and 77 degrees F). Protect from light. Throw away any unused medicine after the expiration date. NOTE: This sheet is a summary. It may not cover all possible information. If you have questions about this medicine, talk to your doctor, pharmacist, or health care provider.  2020 Elsevier/Gold Standard (2017-04-26 13:38:43)

## 2019-06-13 ENCOUNTER — Other Ambulatory Visit: Payer: Self-pay

## 2019-06-13 DIAGNOSIS — Z79899 Other long term (current) drug therapy: Secondary | ICD-10-CM

## 2019-06-16 LAB — COMPLETE METABOLIC PANEL WITH GFR
AG Ratio: 0.8 (calc) — ABNORMAL LOW (ref 1.0–2.5)
ALT: 12 U/L (ref 6–29)
AST: 15 U/L (ref 10–30)
Albumin: 3.7 g/dL (ref 3.6–5.1)
Alkaline phosphatase (APISO): 62 U/L (ref 31–125)
BUN: 13 mg/dL (ref 7–25)
CO2: 22 mmol/L (ref 20–32)
Calcium: 8.9 mg/dL (ref 8.6–10.2)
Chloride: 103 mmol/L (ref 98–110)
Creat: 0.84 mg/dL (ref 0.50–1.10)
GFR, Est African American: 99 mL/min/{1.73_m2} (ref >=60)
GFR, Est Non African American: 85 mL/min/{1.73_m2} (ref >=60)
Globulin: 4.5 g/dL (calc) — ABNORMAL HIGH (ref 1.9–3.7)
Glucose, Bld: 119 mg/dL — ABNORMAL HIGH (ref 65–99)
Potassium: 4.4 mmol/L (ref 3.5–5.3)
Sodium: 136 mmol/L (ref 135–146)
Total Bilirubin: 0.3 mg/dL (ref 0.2–1.2)
Total Protein: 8.2 g/dL — ABNORMAL HIGH (ref 6.1–8.1)

## 2019-06-16 LAB — HEPATITIS PANEL, ACUTE
Hep A IgM: NONREACTIVE
Hep B C IgM: NONREACTIVE
Hepatitis B Surface Ag: NONREACTIVE
Hepatitis C Ab: NONREACTIVE
SIGNAL TO CUT-OFF: 0.11 (ref <1.00)

## 2019-06-16 LAB — CBC WITH DIFFERENTIAL/PLATELET
Absolute Monocytes: 270 cells/uL (ref 200–950)
Basophils Absolute: 22 cells/uL (ref 0–200)
Basophils Relative: 0.4 %
Eosinophils Absolute: 32 cells/uL (ref 15–500)
Eosinophils Relative: 0.6 %
HCT: 33.6 % — ABNORMAL LOW (ref 35.0–45.0)
Hemoglobin: 11 g/dL — ABNORMAL LOW (ref 11.7–15.5)
Lymphs Abs: 1339 cells/uL (ref 850–3900)
MCH: 28.6 pg (ref 27.0–33.0)
MCHC: 32.7 g/dL (ref 32.0–36.0)
MCV: 87.3 fL (ref 80.0–100.0)
MPV: 10.1 fL (ref 7.5–12.5)
Monocytes Relative: 5 %
Neutro Abs: 3737 cells/uL (ref 1500–7800)
Neutrophils Relative %: 69.2 %
Platelets: 361 10*3/uL (ref 140–400)
RBC: 3.85 10*6/uL (ref 3.80–5.10)
RDW: 11.9 % (ref 11.0–15.0)
Total Lymphocyte: 24.8 %
WBC: 5.4 10*3/uL (ref 3.8–10.8)

## 2019-06-16 NOTE — Progress Notes (Signed)
Hepatitis panel negative.

## 2019-06-16 NOTE — Progress Notes (Signed)
Mild anemia noted . Labs are stable.

## 2019-06-20 NOTE — Telephone Encounter (Signed)
Patient states she is scheduled for an appointment to have an IUD on 06/23/19. Patient will let us know after she has had the procedure so we may send in her prescriptions.

## 2019-06-22 ENCOUNTER — Other Ambulatory Visit: Payer: Self-pay | Admitting: Physician Assistant

## 2019-06-23 ENCOUNTER — Telehealth: Payer: Self-pay | Admitting: Rheumatology

## 2019-06-23 MED ORDER — FOLIC ACID 1 MG PO TABS: 2.0000 mg | ORAL_TABLET | Freq: Every day | ORAL | 2 refills | Status: DC

## 2019-06-23 MED ORDER — METHOTREXATE 2.5 MG PO TABS: ORAL_TABLET | ORAL | 0 refills | Status: DC

## 2019-06-23 NOTE — Telephone Encounter (Signed)
NuvaRing was placed today per patient (see telephone encounter 06/23/2019). Methotrexate and folic acid have been sent to the pharmacy. Patient is aware.

## 2019-06-23 NOTE — Telephone Encounter (Signed)
Patient calling to let you know birth control has been put in place. She is now ready for MTX. Patient uses [facility] on [address].

## 2019-06-23 NOTE — Telephone Encounter (Signed)
Patient advised prescription sent to the pharmacy.  

## 2019-06-23 NOTE — Telephone Encounter (Signed)
Last Visit: 06/12/19 Next Visit: 07/15/19  Okay to refill per Dr. Estanislado Pandy

## 2019-07-01 NOTE — Progress Notes (Deleted)
Office Visit Note  Patient: Jennifer [name]             Date of Birth: Jan 29, 1976           MRN: [id number]             PCP: Burnis Medin, MD Referring: Burnis Medin, MD Visit Date: 07/15/2019 Occupation: @GUAROCC @  Subjective:  No chief complaint on file.  Cimzia 400 mg in office every 28 days, methotrexate 6 tablets every 7 days, and folic acid 1 mg 2 tablets daily.  Last TB gold negative on 03/14/2019 and will monitor yearly.  Most recent CBC/CMP within normal limits except for low hemoglobin on 06/13/2019.  History of Present Illness: Jennifer [name] is a 43 y.o. female ***   Activities of Daily Living:  Patient reports morning stiffness for *** {minute/hour:19697}.   Patient {ACTIONS;DENIES/REPORTS:21021675::"Denies"} nocturnal pain.  Difficulty dressing/grooming: {ACTIONS;DENIES/REPORTS:21021675::"Denies"} Difficulty climbing stairs: {ACTIONS;DENIES/REPORTS:21021675::"Denies"} Difficulty getting out of chair: {ACTIONS;DENIES/REPORTS:21021675::"Denies"} Difficulty using hands for taps, buttons, cutlery, and/or writing: {ACTIONS;DENIES/REPORTS:21021675::"Denies"}  No Rheumatology ROS completed.   PMFS History:  Patient Active Problem List   Diagnosis Date Noted  . Primary osteoarthritis of both hands 02/04/2018  . Primary osteoarthritis of both knees 05/22/2017  . Incisional hernia 12/18/2016  . Leiomyoma of uterus 09/06/2016  . Fibroid, uterine   . Essential hypertension 05/25/2015  . Recurrent headache 05/25/2015  . Head lump 07/31/2014  . Edema 12/29/2013  . Baker's cyst, ruptured 12/25/2013  . Cough, persistent 12/12/2013  . Left leg swelling 12/12/2013  . Cough 12/12/2013  . High risk medication use 12/12/2013  . Recurrent periodic urticaria   . Rheumatoid arthritis ([place]) 08/28/2012  . CHRONIC LARYNGITIS 11/03/2010  . ALLERGIC RHINITIS 11/03/2010  . PARESTHESIA 07/28/2010  . DYSMENORRHEA 10/15/2007  . FIBROIDS, UTERUS 07/24/2007  . OBESITY 07/24/2007   . SLEEPLESSNESS 07/24/2007  . HEADACHE 07/24/2007    Past Medical History:  Diagnosis Date  . Acute otitis media 08/28/2012   improved  change to liquid medication   . Anxiety   . Breast abscess of female    Recurrent  . Depression   . Family history of adverse reaction to anesthesia    father hard time waking once (12/20/2016)  . Fibroids    w/bleeding  . GERD (gastroesophageal reflux disease)   . History of blood transfusion    after surgery  . Hypertension   . Migraines    Dr. Orie Rout; "I have a few/month" (12/20/2016)  . Osteoarthritis   . Pill esophagitis 08/28/2012   amoxicillin  by hx  disc plan nl voice except hoarse not drooling  close fu  if not getting better with plan stop aleve  liquid ibu onlyf necessary   . Recurrent periodic urticaria   . Rheumatoid arthritis ([place])    "55% of my body" (12/20/2016)  . Sleep apnea    wears cpap    Family History  Problem Relation Age of Onset  . Hypertension Mother   . Rheum arthritis Mother   . Hypertension Father   . Sleep apnea Father    Past Surgical History:  Procedure Laterality Date  . BREAST SURGERY     abcess under left breast   . CYST EXCISION Left 2014   "elbow"  . HERNIA REPAIR    . INCISION AND DRAINAGE BREAST ABSCESS Left 2003  . INCISIONAL HERNIA REPAIR N/A 12/18/2016   Procedure: REPAIR INCISIONAL HERNIA WITH MESH;  Surgeon: Georganna Skeans, MD;  Location: [facility];  Service: General;  Laterality: N/A;  . INSERTION OF MESH N/A 12/18/2016   Procedure: INSERTION OF MESH;  Surgeon: Georganna Skeans, MD;  Location: [facility];  Service: General;  Laterality: N/A;  . KNEE ARTHROSCOPY WITH MENISCAL REPAIR Right 06/17/2018   Procedure: RIGHT KNEE ARTHROSCOPY WITH MENISCAL REPAIR;  Surgeon: Vickey Huger, MD;  Location: [facility];  Service: Orthopedics;  Laterality: Right;  . MYOMECTOMY N/A 09/06/2016   Procedure: ABOMINAL MYOMECTOMY;  Surgeon: Governor Specking, MD;  Location: [facility];  Service: Gynecology;  Laterality: N/A;   . UTERINE FIBROID SURGERY     34 fibroids   Social History   Social History Narrative  . Not on file   Immunization History  Administered Date(s) Administered  . Influenza Split 08/21/2012  . Influenza,inj,Quad PF,6+ Mos 07/31/2014  . Pneumococcal Conjugate-13 08/28/2012     Objective: Vital Signs: There were no vitals taken for this visit.   Physical Exam   Musculoskeletal Exam: ***  CDAI Exam: CDAI Score: - Patient Global: -; Provider Global: - Swollen: -; Tender: - Joint Exam   No joint exam has been documented for this visit   There is currently no information documented on the homunculus. Go to the Rheumatology activity and complete the homunculus joint exam.  Investigation: No additional findings.  Imaging: No results found.  Recent Labs: Lab Results  Component Value Date   WBC 5.4 06/13/2019   HGB 11.0 (L) 06/13/2019   PLT 361 06/13/2019   NA 136 06/13/2019   K 4.4 06/13/2019   CL 103 06/13/2019   CO2 22 06/13/2019   GLUCOSE 119 (H) 06/13/2019   BUN 13 06/13/2019   CREATININE 0.84 06/13/2019   BILITOT 0.3 06/13/2019   ALKPHOS 69 06/11/2018   AST 15 06/13/2019   ALT 12 06/13/2019   PROT 8.2 (H) 06/13/2019   ALBUMIN 3.8 06/11/2018   CALCIUM 8.9 06/13/2019   GFRAA 99 06/13/2019   QFTBGOLDPLUS NEGATIVE 03/14/2019    Speciality Comments: Prior therapy: Plaquenil (inadequate response)  Procedures:  No procedures performed Allergies: Lisinopril-hydrochlorothiazide   Assessment / Plan:     Visit Diagnoses: No diagnosis found.  Orders: No orders of the defined types were placed in this encounter.  No orders of the defined types were placed in this encounter.   Face-to-face time spent with patient was *** minutes. Greater than 50% of time was spent in counseling and coordination of care.  Follow-Up Instructions: No follow-ups on file.   Earnestine Mealing, CMA  Note - This record has been created using Editor, commissioning.  Chart creation  errors have been sought, but may not always  have been located. Such creation errors do not reflect on  the standard of medical care.

## 2019-07-07 ENCOUNTER — Other Ambulatory Visit: Payer: Self-pay

## 2019-07-07 ENCOUNTER — Ambulatory Visit (INDEPENDENT_AMBULATORY_CARE_PROVIDER_SITE_OTHER): Payer: 59 | Admitting: *Deleted

## 2019-07-07 VITALS — BP 159/96 | HR 83

## 2019-07-07 DIAGNOSIS — M0579 Rheumatoid arthritis with rheumatoid factor of multiple sites without organ or systems involvement: Secondary | ICD-10-CM | POA: Diagnosis not present

## 2019-07-07 MED ORDER — CERTOLIZUMAB PEGOL 2 X 200 MG ~~LOC~~ KIT
400.0000 mg | PACK | Freq: Once | SUBCUTANEOUS | Status: AC
  Administered 2019-07-07: 400 mg via SUBCUTANEOUS

## 2019-07-07 NOTE — Progress Notes (Signed)
Patient in office for Cimzia injection. Patient denies fever, infection and use of antibiotics. Patient given injections in right and left lower abdomen. Patient tolerated injections well. Patient will return in 1 month for next injection.  Administrations This Visit    Certolizumab Pegol KIT 400 mg    Admin Date 07/07/2019 Action Given Dose 400 mg Route Subcutaneous Administered By Carole Binning, LPN

## 2019-07-15 ENCOUNTER — Ambulatory Visit: Payer: 59 | Admitting: Physician Assistant

## 2019-07-19 ENCOUNTER — Other Ambulatory Visit: Payer: Self-pay | Admitting: Rheumatology

## 2019-07-21 NOTE — Telephone Encounter (Signed)
Patient states she has not started the MTX due to being on antibiotics. Patient will start this week. Patient does not need a refill at this time.

## 2019-07-25 NOTE — Progress Notes (Signed)
Office Visit Note  Patient: Jennifer [name]             Date of Birth: March 24, 1976           MRN: [id number]             PCP: Burnis Medin, MD Referring: Burnis Medin, MD Visit Date: 08/08/2019 Occupation: @GUAROCC @  Subjective:  Right knee joint pain    History of Present Illness: Jennifer [name] is a 43 y.o. female with history of seropositive rheumatoid arthritis and osteoarthritis.  Patient is on Cimzia 40 mg in office injections every 28 days, methotrexate 6 tablets by mouth once weekly, folic acid 2 mg by mouth daily.  She presents today for her in office Cimzia injection.  She has had 3 doses of methotrexate and has been tolerating it well.  She has not been taking folic acid due to not receiving the prescription.  She has not missed any doses of Cimzia recently.  She continues to have persistent pain in the right knee joint.  She has warmth and swelling in the right knee.  She reports that she needs to lose weight prior to having the right knee replaced but is hoping to have it replaced in 2021.  She denies any other joint pain or joint swelling at this time.  She states that she had a right wrist cortisone junction on 06/12/2019 which has resolved her symptoms.  She does have hypopigmentation in that area due to the injection.    Activities of Daily Living:  Patient reports morning stiffness for 3 hours.   Patient Denies nocturnal pain.  Difficulty dressing/grooming: Denies Difficulty climbing stairs: Reports Difficulty getting out of chair: Denies Difficulty using hands for taps, buttons, cutlery, and/or writing: Denies  Review of Systems  Constitutional: Positive for fatigue.  HENT: Negative for mouth sores, mouth dryness and nose dryness.   Eyes: Negative for pain, visual disturbance and dryness.  Respiratory: Negative for cough, hemoptysis, shortness of breath and difficulty breathing.   Cardiovascular: Negative for chest pain, palpitations, hypertension and swelling  in legs/feet.  Gastrointestinal: Negative for blood in stool, constipation and diarrhea.  Endocrine: Negative for increased urination.  Genitourinary: Negative for difficulty urinating and painful urination.  Musculoskeletal: Positive for arthralgias, joint pain, joint swelling, muscle weakness and morning stiffness. Negative for myalgias, muscle tenderness and myalgias.  Skin: Positive for rash. Negative for color change, pallor, hair loss, nodules/bumps, skin tightness, ulcers and sensitivity to sunlight.  Allergic/Immunologic: Negative for susceptible to infections.  Neurological: Positive for headaches. Negative for dizziness, numbness and weakness.  Hematological: Negative for bruising/bleeding tendency and swollen glands.  Psychiatric/Behavioral: Positive for sleep disturbance. Negative for depressed mood. The patient is not nervous/anxious.     PMFS History:  Patient Active Problem List   Diagnosis Date Noted   Primary osteoarthritis of both hands 02/04/2018   Primary osteoarthritis of both knees 05/22/2017   Incisional hernia 12/18/2016   Leiomyoma of uterus 09/06/2016   Fibroid, uterine    Essential hypertension 05/25/2015   Recurrent headache 05/25/2015   Head lump 07/31/2014   Edema 12/29/2013   Baker's cyst, ruptured 12/25/2013   Cough, persistent 12/12/2013   Left leg swelling 12/12/2013   Cough 12/12/2013   High risk medication use 12/12/2013   Recurrent periodic urticaria    Rheumatoid arthritis ([place]) 08/28/2012   CHRONIC LARYNGITIS 11/03/2010   ALLERGIC RHINITIS 11/03/2010   PARESTHESIA 07/28/2010   DYSMENORRHEA 10/15/2007   FIBROIDS, UTERUS 07/24/2007  OBESITY 07/24/2007   SLEEPLESSNESS 07/24/2007   HEADACHE 07/24/2007    Past Medical History:  Diagnosis Date   Acute otitis media 08/28/2012   improved  change to liquid medication    Anxiety    Breast abscess of female    Recurrent   Depression    Family history of adverse  reaction to anesthesia    father hard time waking once (12/20/2016)   Fibroids    w/bleeding   GERD (gastroesophageal reflux disease)    History of blood transfusion    after surgery   Hypertension    Migraines    Dr. Orie Rout; "I have a few/month" (12/20/2016)   Osteoarthritis    Pill esophagitis 08/28/2012   amoxicillin  by hx  disc plan nl voice except hoarse not drooling  close fu  if not getting better with plan stop aleve  liquid ibu onlyf necessary    Recurrent periodic urticaria    Rheumatoid arthritis ([place])    "55% of my body" (12/20/2016)   Sleep apnea    wears cpap    Family History  Problem Relation Age of Onset   Hypertension Mother    Rheum arthritis Mother    Hypertension Father    Sleep apnea Father    Past Surgical History:  Procedure Laterality Date   BREAST SURGERY     abcess under left breast    CYST EXCISION Left 2014   "elbow"   HERNIA REPAIR     INCISION AND DRAINAGE BREAST ABSCESS Left 2003   INCISIONAL HERNIA REPAIR N/A 12/18/2016   Procedure: REPAIR INCISIONAL HERNIA WITH MESH;  Surgeon: Georganna Skeans, MD;  Location: [place];  Service: General;  Laterality: N/A;   INSERTION OF MESH N/A 12/18/2016   Procedure: INSERTION OF MESH;  Surgeon: Georganna Skeans, MD;  Location: [place];  Service: General;  Laterality: N/A;   KNEE ARTHROSCOPY WITH MENISCAL REPAIR Right 06/17/2018   Procedure: RIGHT KNEE ARTHROSCOPY WITH MENISCAL REPAIR;  Surgeon: Vickey Huger, MD;  Location: [facility];  Service: Orthopedics;  Laterality: Right;   MYOMECTOMY N/A 09/06/2016   Procedure: Lendell Caprice;  Surgeon: Governor Specking, MD;  Location: [facility];  Service: Gynecology;  Laterality: N/A;   UTERINE FIBROID SURGERY     35 fibroids   Social History   Social History Narrative   Not on file   Immunization History  Administered Date(s) Administered   Influenza Split 08/21/2012   Influenza,inj,Quad PF,6+ Mos 07/31/2014   Pneumococcal Conjugate-13  08/28/2012     Objective: Vital Signs: BP (!) 144/95 (BP Location: Right Arm, Patient Position: Sitting, Cuff Size: Large)    Resp 16    Ht 5' 4"  (1.626 m)    Wt 290 lb 9.6 oz (131.8 kg)    BMI 49.88 kg/m    Physical Exam Vitals signs and nursing note reviewed.  Constitutional:      Appearance: She is well-developed.  HENT:     Head: Normocephalic and atraumatic.  Eyes:     Conjunctiva/sclera: Conjunctivae normal.  Neck:     Musculoskeletal: Normal range of motion.  Cardiovascular:     Rate and Rhythm: Normal rate and regular rhythm.     Heart sounds: Normal heart sounds.  Pulmonary:     Effort: Pulmonary effort is normal.     Breath sounds: Normal breath sounds.  Abdominal:     General: Bowel sounds are normal.     Palpations: Abdomen is soft.  Lymphadenopathy:     Cervical: No  cervical adenopathy.  Skin:    General: Skin is warm and dry.     Capillary Refill: Capillary refill takes less than 2 seconds.     Comments: Area of hypopigmentation on the dorsal aspect of the right wrist  Neurological:     Mental Status: She is alert and oriented to person, place, and time.  Psychiatric:        Behavior: Behavior normal.      Musculoskeletal Exam: C-spine, thoracic spine, lumbar spine good range of motion.  No midline spinal tenderness.  Shoulder joints, elbow joints, wrist joints, MCPs, PIPs and DIPs good range of motion with no synovitis.  She has complete fist formation bilaterally.  Hip joints have good range of motion with no discomfort.  Right knee has painful limited flexion and extension.  She has warmth and inflammation of the right knee joint on exam.  Left knee has good range of motion no discomfort.  Ankle joints have good range of motion with no tenderness or inflammation.  CDAI Exam: CDAI Score: 3  Patient Global: 5 mm; Provider Global: 5 mm Swollen: 1 ; Tender: 1  Joint Exam      Right  Left  Knee  Swollen Tender        Investigation: No additional  findings.  Imaging: No results found.  Recent Labs: Lab Results  Component Value Date   WBC 5.4 06/13/2019   HGB 11.0 (L) 06/13/2019   PLT 361 06/13/2019   NA 136 06/13/2019   K 4.4 06/13/2019   CL 103 06/13/2019   CO2 22 06/13/2019   GLUCOSE 119 (H) 06/13/2019   BUN 13 06/13/2019   CREATININE 0.84 06/13/2019   BILITOT 0.3 06/13/2019   ALKPHOS 69 06/11/2018   AST 15 06/13/2019   ALT 12 06/13/2019   PROT 8.2 (H) 06/13/2019   ALBUMIN 3.8 06/11/2018   CALCIUM 8.9 06/13/2019   GFRAA 99 06/13/2019   QFTBGOLDPLUS NEGATIVE 03/14/2019    Speciality Comments: Prior therapy: Plaquenil (inadequate response)  Procedures:  No procedures performed Allergies: Lisinopril-hydrochlorothiazide   Assessment / Plan:     Visit Diagnoses: Rheumatoid arthritis involving multiple sites with positive rheumatoid factor (HCC) - +CCP, +HLA B27: She presents today with ongoing pain and inflammation of the right knee joint.  She has painful and limited flexion and extension of the right knee.  She had a right wrist joint cortisone junction on 06/12/2019 which resolved her discomfort.  She has no tenderness or synovitis of the right wrist joint on exam today.  She continues to receive Cimzia 40 mg subcutaneous injections in office every 28 days.  She was started on methotrexate 6 tablets by mouth once weekly which she has had 3 doses of.  She has not been taking folic acid.  She was advised to take folic acid 2 mg by mouth daily.  A prescription for folic acid was sent to the pharmacy today.  She will continue on this current treatment regimen.  She was advised to notify us if she develops increased joint pain or joint swelling.  She will follow-up in the office in 2 months.  High risk medication use - Cimzia 400 mg in office every 28 days, methotrexate 6 tablets every 7 days, and folic acid 1 mg 2 tablets daily.  Last TB gold negative on 03/14/2019 and will monitor yearly.  Most recent CBC/CMP within normal  limits except for low hemoglobin on 06/13/2019.  We will check CBC and CMP today.- Plan: CBC, CMP  Primary osteoarthritis of both hands: She has PIP and DIP synovial thickening consistent osteoarthritis of both hands.  She has no tenderness or synovitis on exam today.  She has complete fist formation bilaterally.  She had a right wrist cortisone injection on 06/12/2019 which provided significant relief.  She has no tenderness or inflammation of the wrist joints on exam today.  Primary osteoarthritis of both knees: She has chronic right knee joint pain.  She has warmth and inflammation of the right knee on exam today.  She has painful limited flexion extension of the right knee.  She needs to get her BMI less than 40 prior to scheduling a right knee replacement.  Left knee has good range of motion with no warmth or effusion.  S/P right knee arthroscopy: She has chronic right knee joint pain.  She has warmth and inflammation on exam today.  Essential hypertension  Orders: Orders Placed This Encounter  Procedures   CBC   CMP   Meds ordered this encounter  Medications   folic acid (FOLVITE) 1 MG tablet    Sig: Take 2 tablets (2 mg total) by mouth daily.    Dispense:  180 tablet    Refill:  2    Face-to-face time spent with patient was 30 minutes. Greater than 50% of time was spent in counseling and coordination of care.  Follow-Up Instructions: Return in about 2 months (around 10/08/2019) for Rheumatoid arthritis, Osteoarthritis.   Ofilia Neas, PA-C  Note - This record has been created using Dragon software.  Chart creation errors have been sought, but may not always  have been located. Such creation errors do not reflect on  the standard of medical care.

## 2019-08-02 ENCOUNTER — Other Ambulatory Visit: Payer: Self-pay | Admitting: Rheumatology

## 2019-08-04 NOTE — Telephone Encounter (Addendum)
Last Visit: 06/12/19 Next Visit: 08/08/19  Labs: 06/13/19 WNL  Attempted to contact the patient to verify dose on MTX. Left message for patient to call the office.  Patient states she is still on 6 tabs. Patient due to increase after labs on 08/08/19  Okay to refill per Dr. Estanislado Pandy

## 2019-08-08 ENCOUNTER — Other Ambulatory Visit: Payer: Self-pay

## 2019-08-08 ENCOUNTER — Ambulatory Visit (INDEPENDENT_AMBULATORY_CARE_PROVIDER_SITE_OTHER): Payer: 59 | Admitting: Physician Assistant

## 2019-08-08 ENCOUNTER — Encounter: Payer: Self-pay | Admitting: Physician Assistant

## 2019-08-08 VITALS — BP 144/95 | Resp 16 | Ht 64.0 in | Wt 290.6 lb

## 2019-08-08 DIAGNOSIS — M170 Bilateral primary osteoarthritis of knee: Secondary | ICD-10-CM | POA: Diagnosis not present

## 2019-08-08 DIAGNOSIS — Z9889 Other specified postprocedural states: Secondary | ICD-10-CM

## 2019-08-08 DIAGNOSIS — M0579 Rheumatoid arthritis with rheumatoid factor of multiple sites without organ or systems involvement: Secondary | ICD-10-CM

## 2019-08-08 DIAGNOSIS — M19041 Primary osteoarthritis, right hand: Secondary | ICD-10-CM | POA: Diagnosis not present

## 2019-08-08 DIAGNOSIS — I10 Essential (primary) hypertension: Secondary | ICD-10-CM

## 2019-08-08 DIAGNOSIS — Z79899 Other long term (current) drug therapy: Secondary | ICD-10-CM

## 2019-08-08 DIAGNOSIS — M19042 Primary osteoarthritis, left hand: Secondary | ICD-10-CM

## 2019-08-08 MED ORDER — CERTOLIZUMAB PEGOL 2 X 200 MG ~~LOC~~ KIT
400.0000 mg | PACK | Freq: Once | SUBCUTANEOUS | Status: AC
  Administered 2019-08-08: 400 mg via SUBCUTANEOUS

## 2019-08-08 MED ORDER — FOLIC ACID 1 MG PO TABS: 2.0000 mg | ORAL_TABLET | Freq: Every day | ORAL | 2 refills | Status: DC

## 2019-08-08 NOTE — Progress Notes (Signed)
Administrations This Visit    Certolizumab Pegol KIT 400 mg    Admin Date 08/08/2019 Action Given Dose 400 mg Route Subcutaneous Administered By ,  C, CMA        Patient in office for an office visit and Cimzia injection. Patient denies fever, infection and use of antibiotics. Patient given injections in right and left lower abdomen. Patient tolerated injections well. Patient will return in 1 month for next injection (Andrea will schedule with patient).  

## 2019-08-09 LAB — COMPLETE METABOLIC PANEL WITH GFR
AG Ratio: 0.7 (calc) — ABNORMAL LOW (ref 1.0–2.5)
ALT: 9 U/L (ref 6–29)
AST: 13 U/L (ref 10–30)
Albumin: 3.7 g/dL (ref 3.6–5.1)
Alkaline phosphatase (APISO): 75 U/L (ref 31–125)
BUN: 12 mg/dL (ref 7–25)
CO2: 25 mmol/L (ref 20–32)
Calcium: 8.8 mg/dL (ref 8.6–10.2)
Chloride: 102 mmol/L (ref 98–110)
Creat: 0.85 mg/dL (ref 0.50–1.10)
GFR, Est African American: 97 mL/min/{1.73_m2} (ref >=60)
GFR, Est Non African American: 83 mL/min/{1.73_m2} (ref >=60)
Globulin: 5 g/dL (calc) — ABNORMAL HIGH (ref 1.9–3.7)
Glucose, Bld: 87 mg/dL (ref 65–99)
Potassium: 4.3 mmol/L (ref 3.5–5.3)
Sodium: 138 mmol/L (ref 135–146)
Total Bilirubin: 0.3 mg/dL (ref 0.2–1.2)
Total Protein: 8.7 g/dL — ABNORMAL HIGH (ref 6.1–8.1)

## 2019-08-09 LAB — CBC WITH DIFFERENTIAL/PLATELET
Absolute Monocytes: 627 cells/uL (ref 200–950)
Basophils Absolute: 10 cells/uL (ref 0–200)
Basophils Relative: 0.2 %
Eosinophils Absolute: 98 cells/uL (ref 15–500)
Eosinophils Relative: 2 %
HCT: 36.1 % (ref 35.0–45.0)
Hemoglobin: 11.7 g/dL (ref 11.7–15.5)
Lymphs Abs: 2161 cells/uL (ref 850–3900)
MCH: 28.9 pg (ref 27.0–33.0)
MCHC: 32.4 g/dL (ref 32.0–36.0)
MCV: 89.1 fL (ref 80.0–100.0)
MPV: 9.7 fL (ref 7.5–12.5)
Monocytes Relative: 12.8 %
Neutro Abs: 2004 cells/uL (ref 1500–7800)
Neutrophils Relative %: 40.9 %
Platelets: 353 10*3/uL (ref 140–400)
RBC: 4.05 10*6/uL (ref 3.80–5.10)
RDW: 13.3 % (ref 11.0–15.0)
Total Lymphocyte: 44.1 %
WBC: 4.9 10*3/uL (ref 3.8–10.8)

## 2019-08-11 ENCOUNTER — Telehealth: Payer: Self-pay | Admitting: *Deleted

## 2019-08-11 NOTE — Telephone Encounter (Signed)
Should patient take 6 or 8 MTX weekly? Patient stated she is supposed to increase to 8 if labs are stable, which they are. Office note states 6 weekly; however, RX says increase to 8. Please advise.

## 2019-08-11 NOTE — Progress Notes (Signed)
CBC WNL. CMP stable.

## 2019-08-12 NOTE — Telephone Encounter (Signed)
Please advise patient to increase methotrexate to 8 tablets by mouth once weekly and folic acid 2 mg po daily.

## 2019-08-12 NOTE — Telephone Encounter (Signed)
I called patient, patient understands

## 2019-08-19 ENCOUNTER — Other Ambulatory Visit: Payer: Self-pay

## 2019-08-19 ENCOUNTER — Ambulatory Visit (INDEPENDENT_AMBULATORY_CARE_PROVIDER_SITE_OTHER): Payer: 59

## 2019-08-19 ENCOUNTER — Encounter: Payer: Self-pay | Admitting: Internal Medicine

## 2019-08-19 DIAGNOSIS — Z23 Encounter for immunization: Secondary | ICD-10-CM

## 2019-09-01 ENCOUNTER — Encounter: Payer: Self-pay | Admitting: Rheumatology

## 2019-09-01 NOTE — Telephone Encounter (Signed)
I would not advise stopping methotrexate and switching to prednisone.  Prednisone will cause weight gain which she is already struggling with.  The pain from knee joint comes from severe osteoarthritis which will only get relief from having a total knee replacement.  If she is interested in getting a cortisone injection that will help her temporarily.

## 2019-09-02 MED ORDER — PREDNISONE 5 MG PO TABS: ORAL_TABLET | ORAL | 0 refills | Status: DC

## 2019-09-02 NOTE — Telephone Encounter (Signed)
I spoke with patient and explained to her that her rheumatoid arthritis is very well controlled on the combination of methotrexate and Cimzia.  Her discomfort is coming from osteoarthritis.  She is requesting a short course of prednisone taper.  I also offered referral to pain management but she declined.  It is okay to give her prednisone taper starting at 20 mg and taper by 5 mg every 4 days.

## 2019-09-03 ENCOUNTER — Other Ambulatory Visit: Payer: Self-pay | Admitting: Rheumatology

## 2019-09-03 NOTE — Telephone Encounter (Signed)
Last Visit: 08/08/2019 Next Visit: 09/08/2019 nurse visit. Message sent to the front desk to schedule follow up.  Labs: 08/08/2019 CBC WNL. CMP stable.   Okay to refill per Dr. Estanislado Pandy.

## 2019-09-06 ENCOUNTER — Other Ambulatory Visit: Payer: Self-pay | Admitting: Internal Medicine

## 2019-09-08 ENCOUNTER — Ambulatory Visit (INDEPENDENT_AMBULATORY_CARE_PROVIDER_SITE_OTHER): Payer: 59

## 2019-09-08 ENCOUNTER — Other Ambulatory Visit: Payer: Self-pay

## 2019-09-08 VITALS — BP 160/100 | HR 79

## 2019-09-08 DIAGNOSIS — M0579 Rheumatoid arthritis with rheumatoid factor of multiple sites without organ or systems involvement: Secondary | ICD-10-CM

## 2019-09-08 MED ORDER — CERTOLIZUMAB PEGOL 2 X 200 MG ~~LOC~~ KIT
400.0000 mg | PACK | Freq: Once | SUBCUTANEOUS | Status: AC
  Administered 2019-09-08: 400 mg via SUBCUTANEOUS

## 2019-09-08 NOTE — Progress Notes (Signed)
Patient in office for an office visit and Cimzia injection. Patient denies fever, infection and use of antibiotics. Patient's blood pressure is elevated, per Dr. Estanislado Pandy it is okay to proceed with injections. Patient given injections in right and left lower abdomen. Patient tolerated injections well. Patient will return in 1 month for next injection.   Administrations This Visit    Certolizumab Pegol KIT 400 mg    Admin Date 09/08/2019 Action Given Dose 400 mg Route Subcutaneous Administered By Earnestine Mealing, CMA

## 2019-09-20 ENCOUNTER — Encounter: Payer: Self-pay | Admitting: Emergency Medicine

## 2019-09-20 ENCOUNTER — Other Ambulatory Visit: Payer: Self-pay

## 2019-09-20 ENCOUNTER — Ambulatory Visit
Admission: EM | Admit: 2019-09-20 | Discharge: 2019-09-20 | Disposition: A | Discharge status: Home / Self Care | Payer: 59 | Attending: Emergency Medicine | Admitting: Emergency Medicine

## 2019-09-20 DIAGNOSIS — R6889 Other general symptoms and signs: Secondary | ICD-10-CM

## 2019-09-20 DIAGNOSIS — Z20828 Contact with and (suspected) exposure to other viral communicable diseases: Secondary | ICD-10-CM | POA: Diagnosis not present

## 2019-09-20 DIAGNOSIS — R05 Cough: Secondary | ICD-10-CM

## 2019-09-20 DIAGNOSIS — G43809 Other migraine, not intractable, without status migrainosus: Secondary | ICD-10-CM

## 2019-09-20 DIAGNOSIS — R053 Chronic cough: Secondary | ICD-10-CM

## 2019-09-20 MED ORDER — RIZATRIPTAN BENZOATE 10 MG PO TBDP: 10.0000 mg | ORAL_TABLET | ORAL | 0 refills | Status: DC | PRN

## 2019-09-20 NOTE — ED Triage Notes (Addendum)
Pt presents to [facility] for 2 days of headache, body ache, mild cough.  Denies nasal congestion, n/v.d  Took last Maxalt two days ago without relief, trying theraflu and hot tea at this time.  Hx of RA, currently on prednisone

## 2019-09-20 NOTE — ED Provider Notes (Signed)
[name]    CSN: [id number] Arrival date & time: 09/20/19  1050      History   Chief Complaint Chief Complaint  Patient presents with  . Headache  . Generalized Body Aches    HPI Jennifer [name] is a 44 y.o. female with history of hypertension, migraines, RA, sleep apnea with CPAP   Presenting for Covid testing: Exposure: mom & niece who tested positive w/in past week Date of exposure: christmas Any fever, symptoms since exposure: yes - generalized headache, myalgias, mild dry cough.  Took maxalt 2 days ago w/ some relief.  States she tolerates this medication well, has run out of medicine and requesting refill as her PCP is not open today.  Theraflu & tea helping some.  Of note, patient is currently finishing taper of prednisone for right knee flare of RA.  Started this on Christmas Day and doing well in that regard.   Past Medical History:  Diagnosis Date  . Acute otitis media 08/28/2012   improved  change to liquid medication   . Anxiety   . Breast abscess of female    Recurrent  . Depression   . Family history of adverse reaction to anesthesia    father hard time waking once (12/20/2016)  . Fibroids    w/bleeding  . GERD (gastroesophageal reflux disease)   . History of blood transfusion    after surgery  . Hypertension   . Migraines    Dr. Orie Rout; "I have a few/month" (12/20/2016)  . Osteoarthritis   . Pill esophagitis 08/28/2012   amoxicillin  by hx  disc plan nl voice except hoarse not drooling  close fu  if not getting better with plan stop aleve  liquid ibu onlyf necessary   . Recurrent periodic urticaria   . Rheumatoid arthritis ([place])    "55% of my body" (12/20/2016)  . Sleep apnea    wears cpap    Patient Active Problem List   Diagnosis Date Noted  . Primary osteoarthritis of both hands 02/04/2018  . Primary osteoarthritis of both knees 05/22/2017  . Incisional hernia 12/18/2016  . Leiomyoma of uterus 09/06/2016  . Fibroid,  uterine   . Essential hypertension 05/25/2015  . Recurrent headache 05/25/2015  . Head lump 07/31/2014  . Edema 12/29/2013  . Baker's cyst, ruptured 12/25/2013  . Cough, persistent 12/12/2013  . Left leg swelling 12/12/2013  . Cough 12/12/2013  . High risk medication use 12/12/2013  . Recurrent periodic urticaria   . Rheumatoid arthritis ([place]) 08/28/2012  . CHRONIC LARYNGITIS 11/03/2010  . ALLERGIC RHINITIS 11/03/2010  . PARESTHESIA 07/28/2010  . DYSMENORRHEA 10/15/2007  . FIBROIDS, UTERUS 07/24/2007  . OBESITY 07/24/2007  . SLEEPLESSNESS 07/24/2007  . HEADACHE 07/24/2007    Past Surgical History:  Procedure Laterality Date  . BREAST SURGERY     abcess under left breast   . CYST EXCISION Left 2014   "elbow"  . HERNIA REPAIR    . INCISION AND DRAINAGE BREAST ABSCESS Left 2003  . INCISIONAL HERNIA REPAIR N/A 12/18/2016   Procedure: REPAIR INCISIONAL HERNIA WITH MESH;  Surgeon: Georganna Skeans, MD;  Location: [facility];  Service: General;  Laterality: N/A;  . INSERTION OF MESH N/A 12/18/2016   Procedure: INSERTION OF MESH;  Surgeon: Georganna Skeans, MD;  Location: [facility];  Service: General;  Laterality: N/A;  . KNEE ARTHROSCOPY WITH MENISCAL REPAIR Right 06/17/2018   Procedure: RIGHT KNEE ARTHROSCOPY WITH MENISCAL REPAIR;  Surgeon: Vickey Huger, MD;  Location:  [facility];  Service: Orthopedics;  Laterality: Right;  . MYOMECTOMY N/A 09/06/2016   Procedure: ABOMINAL MYOMECTOMY;  Surgeon: Governor Specking, MD;  Location: [facility];  Service: Gynecology;  Laterality: N/A;  . UTERINE FIBROID SURGERY     34 fibroids    OB History    Gravida  1   Para  1   Term      Preterm      AB      Living        SAB      TAB      Ectopic      Multiple      Live Births               Home Medications    Prior to Admission medications   Medication Sig Start Date End Date Taking? Authorizing Provider  methotrexate (RHEUMATREX) 2.5 MG tablet TAKE 8 TABS BY MOUTH ONCE WEEKLY (PROTECT  FROM LIGHT) 09/03/19   Deveshwar, Abel Presto, MD  amLODipine (NORVASC) 5 MG tablet TAKE 1 TABLET BY MOUTH EVERY DAY 02/24/19   Panosh, Standley Brooking, MD  CIMZIA 2 X 200 MG KIT INJECT 400 MG UNDER THE SKIN EVERY 28 (TWENTY-EIGHT) DAYS 07/08/18   Bo Merino, MD  clindamycin-benzoyl peroxide (BENZACLIN) gel Apply 1 application topically every other day.  01/31/18   [provider]  diclofenac sodium (VOLTAREN) 1 % GEL APPLY 4 G TOPICALLY 2 (TWO) TIMES DAILY. 06/23/19   Bo Merino, MD  ELDERBERRY PO Take by mouth daily.    [provider]  fexofenadine (ALLEGRA) 180 MG tablet Take 180 mg by mouth daily as needed.     [provider]  fluocinonide ointment (LIDEX) 0.10 % Apply 1 application topically 2 (two) times daily.  12/03/17   [provider]  folic acid (FOLVITE) 1 MG tablet Take 2 tablets (2 mg total) by mouth daily. 08/08/19   Ofilia Neas, PA-C  Levonorgestrel [facility] 19.5 MG IUD [place] 17.5 mcg/24 hrs (81yr) 19.543mintrauterine device  Take 1 device by intrauterine route.    [provider]  Melatonin 10 MG CAPS Take 20 mg by mouth at bedtime as needed (for sleep).    [provider]  methocarbamol (ROBAXIN) 500 MG tablet Take 500 mg by mouth 2 (two) times daily between meals as needed for muscle spasms.    [provider]  Multiple Vitamins-Minerals (MULTIVITAMIN PO) Take by mouth daily.    [provider]  naproxen (NAPROSYN) 500 MG tablet naproxen 500 mg tablet    [provider]  OVER THE COUNTER MEDICATION 3 capsules daily.    [provider]  predniSONE (DELTASONE) 5 MG tablet Take 4 tabs po qd x 4 days, 3  tabs po qd x 4 days, 2  tabs po qd x 4 days, 1  tab po qd x 4 days 09/02/19   DeBo MerinoMD  rizatriptan (MAXALT-MLT) 10 MG disintegrating tablet Take 1 tablet (10 mg total) by mouth as needed for migraine. May repeat in 2 hours if needed 09/20/19   Hall-Potvin, Br[place]-C     Family History Family History  Problem Relation Age of Onset  . Hypertension Mother   . Rheum arthritis Mother   . Hypertension Father   . Sleep apnea Father     Social History Social History   Tobacco Use  . Smoking status: Never Smoker  . Smokeless tobacco: Never Used  Substance Use Topics  . Alcohol use: Yes  Comment: social  . Drug use: Never     Allergies   Lisinopril-hydrochlorothiazide   Review of Systems Review of Systems  Constitutional: Negative for fatigue and fever.  HENT: Negative for ear pain, sinus pain, sore throat and voice change.   Eyes: Negative for pain, redness and visual disturbance.  Respiratory: Positive for cough. Negative for chest tightness, shortness of breath, wheezing and stridor.   Cardiovascular: Negative for chest pain, palpitations and leg swelling.  Gastrointestinal: Negative for abdominal pain, diarrhea and vomiting.  Musculoskeletal: Positive for myalgias. Negative for arthralgias, neck pain and neck stiffness.  Skin: Negative for rash and wound.  Neurological: Positive for headaches. Negative for dizziness, syncope, facial asymmetry, weakness, light-headedness and numbness.     Physical Exam Triage Vital Signs ED Triage Vitals  Enc Vitals Group     BP 09/20/19 1135 (!) 155/96     Pulse Rate 09/20/19 1135 73     Resp 09/20/19 1135 18     Temp 09/20/19 1135 (!) 97.2 F (36.2 C)     Temp Source 09/20/19 1135 Temporal     SpO2 09/20/19 1135 99 %     Weight --      Height --      Head Circumference --      Peak Flow --      Pain Score 09/20/19 1134 8     Pain Loc --      Pain Edu? --      Excl. in [place]? --    No data found.  Updated Vital Signs BP (!) 155/96 (BP Location: Left Arm)   Pulse 73   Temp (!) 97.2 F (36.2 C) (Temporal)   Resp 18   SpO2 99%   Visual Acuity Right Eye Distance:   Left Eye Distance:   Bilateral Distance:    Right Eye Near:   Left Eye Near:    Bilateral Near:     Physical  Exam Constitutional:      General: She is not in acute distress.    Appearance: She is well-developed. She is not toxic-appearing.  HENT:     Head: Normocephalic and atraumatic.     Mouth/Throat:     Mouth: Mucous membranes are moist.     Pharynx: Oropharynx is clear.  Eyes:     General: No scleral icterus.    Extraocular Movements:     Right eye: No nystagmus.     Left eye: No nystagmus.     Pupils: Pupils are equal, round, and reactive to light.  Cardiovascular:     Rate and Rhythm: Normal rate and regular rhythm.  Pulmonary:     Effort: Pulmonary effort is normal. No respiratory distress.     Breath sounds: No wheezing.  Musculoskeletal:        General: No swelling. Normal range of motion.     Cervical back: Neck supple. No rigidity.  Lymphadenopathy:     Cervical: No cervical adenopathy.  Skin:    General: Skin is warm.     Capillary Refill: Capillary refill takes less than 2 seconds.     Coloration: Skin is not cyanotic, jaundiced or pale.  Neurological:     Mental Status: She is alert and oriented to person, place, and time.     Cranial Nerves: No cranial nerve deficit, dysarthria or facial asymmetry.     Coordination: Coordination normal.     Gait: Gait normal.     Deep Tendon Reflexes: Reflexes normal.  Psychiatric:  Mood and Affect: Mood normal.        Speech: Speech normal.        Behavior: Behavior normal.      UC Treatments / Results  Labs (all labs ordered are listed, but only abnormal results are displayed) Labs Reviewed  NOVEL CORONAVIRUS, NAA - Abnormal; Notable for the following components:      Result Value   SARS-CoV-2, NAA Detected (*)    All other components within normal limits   Narrative:    Performed at:  [address], [place], [place]  [id number] Lab Director: Rush Farmer MD, Phone:  [phone]    EKG   Radiology No results found.  Procedures Procedures (including critical care  time)  Medications Ordered in UC Medications - No data to display  Initial Impression / Assessment and Plan / UC Course  I have reviewed the triage vital signs and the nursing notes.  Pertinent labs & imaging results that were available during my care of the patient were reviewed by me and considered in my medical decision making (see chart for details).     Patient afebrile, nontoxic, with SpO2 99%.  Covid PCR pending.  Patient to quarantine until results are back.  We will continue supportive management.  Return precautions discussed, patient verbalized understanding and is agreeable to plan. Final Clinical Impressions(s) / UC Diagnoses   Final diagnoses:  Cough, persistent  Flu-like symptoms  Other migraine without status migrainosus, not intractable     Discharge Instructions     May take 1 tab of 220 mg aleve with maxalt when you get home Need to get refills from PCP Your COVID test is pending - it is important to quarantine / isolate at home until your results are back. If you test positive and would like further evaluation for persistent or worsening symptoms, you may schedule an E-visit or virtual (video) visit throughout the [facility] app or website.  PLEASE NOTE: If you develop severe chest pain or shortness of breath please go to the ER or call 9-1-1 for further evaluation --> DO NOT schedule electronic or virtual visits for this. Please call our office for further guidance / recommendations as needed.    ED Prescriptions    Medication Sig Dispense Auth. Provider   rizatriptan (MAXALT-MLT) 10 MG disintegrating tablet Take 1 tablet (10 mg total) by mouth as needed for migraine. May repeat in 2 hours if needed 3 tablet Hall-Potvin, [place], PA-C     PDMP not reviewed this encounter.   Neldon Mc [place], [place] 09/21/19 1840

## 2019-09-20 NOTE — Discharge Instructions (Addendum)
May take 1 tab of 220 mg aleve with maxalt when you get home Need to get refills from PCP Your COVID test is pending - it is important to quarantine / isolate at home until your results are back. If you test positive and would like further evaluation for persistent or worsening symptoms, you may schedule an E-visit or virtual (video) visit throughout the [facility] app or website.  PLEASE NOTE: If you develop severe chest pain or shortness of breath please go to the ER or call 9-1-1 for further evaluation --> DO NOT schedule electronic or virtual visits for this. Please call our office for further guidance / recommendations as needed.

## 2019-09-21 LAB — NOVEL CORONAVIRUS, NAA: SARS-CoV-2, NAA: DETECTED — AB

## 2019-09-22 ENCOUNTER — Telehealth (HOSPITAL_COMMUNITY): Payer: Self-pay | Admitting: Emergency Medicine

## 2019-09-22 NOTE — Telephone Encounter (Signed)
Your test for COVID-19 was positive, meaning that you were infected with the novel coronavirus and could give the germ to others.  Please continue isolation at home for at least 10 days since the start of your symptoms. If you do not have symptoms, please isolate at home for 10 days from the day you were tested. Once you complete your 10 day quarantine, you may return to normal activities as long as you've not had a fever for over 24 hours(without taking fever reducing medicine) and your symptoms are improving. Please continue good preventive care measures, including:  frequent hand-washing, avoid touching your face, cover coughs/sneezes, stay out of crowds and keep a 6 foot distance from others.  Go to the nearest hospital emergency room if fever/cough/breathlessness are severe or illness seems like a threat to life.  Patient contacted by phone and made aware of    results. Pt verbalized understanding and had all questions answered.  Quarantine ends Jan 11th.

## 2019-09-23 ENCOUNTER — Telehealth: Payer: Self-pay | Admitting: Nurse Practitioner

## 2019-09-23 NOTE — Telephone Encounter (Signed)
Called to Discuss with patient about Covid symptoms and the use of bamlanivimab, a monoclonal antibody infusion for those with mild to moderate Covid symptoms and at a high risk of hospitalization.     Pt is qualified for this infusion at the [facility] infusion center due to co-morbid conditions and/or a member of an at-risk group.     Unable to reach pt  

## 2019-09-23 NOTE — Telephone Encounter (Signed)
Called to Discuss with patient about Covid symptoms and the use of bamlanivimab, a monoclonal antibody infusion for those with mild to moderate Covid symptoms and at a high risk of hospitalization.     Pt is qualified for this infusion at the [facility] infusion center due to co-morbid conditions and/or a member of an at-risk group.     Patient Active Problem List   Diagnosis Date Noted  . Primary osteoarthritis of both hands 02/04/2018  . Primary osteoarthritis of both knees 05/22/2017  . Incisional hernia 12/18/2016  . Leiomyoma of uterus 09/06/2016  . Fibroid, uterine   . Essential hypertension 05/25/2015  . Recurrent headache 05/25/2015  . Head lump 07/31/2014  . Edema 12/29/2013  . Baker's cyst, ruptured 12/25/2013  . Cough, persistent 12/12/2013  . Left leg swelling 12/12/2013  . Cough 12/12/2013  . High risk medication use 12/12/2013  . Recurrent periodic urticaria   . Rheumatoid arthritis ([place]) 08/28/2012  . CHRONIC LARYNGITIS 11/03/2010  . ALLERGIC RHINITIS 11/03/2010  . PARESTHESIA 07/28/2010  . DYSMENORRHEA 10/15/2007  . FIBROIDS, UTERUS 07/24/2007  . OBESITY 07/24/2007  . SLEEPLESSNESS 07/24/2007  . HEADACHE 07/24/2007    Patient would like to think about it and call us back. Symptoms tier reviewed as well as criteria for ending isolation. Preventative practices reviewed. Patient verbalized understanding.    Patient advised to call back if she decides that she does want to get infusion. Callback number to the infusion center given. Patient advised to go to Urgent care or ED with severe symptoms.   Note: Symptoms started on 09/18/19

## 2019-09-23 NOTE — Telephone Encounter (Signed)
Pt calling trying to get intouch with Kenney Houseman her number is [phone]

## 2019-09-23 NOTE — Telephone Encounter (Signed)
So the headache may be from covid and not migraine   The pulmonary people are trying to contact her   See below she is a candidate for  monoclonal antibody infusion that may  Keep her from getting sicker . Please  Get her this information  And get pulmonary to contact her for this .   treatment Otherwise   Would need a virtual voisit to decide on any other  meds  .    09/23/2019 [facility]   Fenton Foy, NP Pulmonary Disease  Conversation (Newest Message First) Fenton Foy, NP     09/23/19 7:51 AM Note   Called toDiscuss with patient about Covid symptoms and the use of bamlanivimab, a monoclonal antibody infusion for those with mild to moderate Covid symptoms and at a high risk of hospitalization.   Pt is qualified for this infusion at the [facility] infusion center due to co-morbid conditions and/or a member of an at-risk group.   Unable to reach pt

## 2019-09-25 ENCOUNTER — Other Ambulatory Visit: Payer: Self-pay | Admitting: Nurse Practitioner

## 2019-09-25 DIAGNOSIS — U071 COVID-19: Secondary | ICD-10-CM

## 2019-09-25 DIAGNOSIS — Z6841 Body Mass Index (BMI) 40.0 and over, adult: Secondary | ICD-10-CM

## 2019-09-25 NOTE — Progress Notes (Signed)
  I connected by phone with Jennifer [name] on 09/25/2019 at 8:35 AM to discuss the potential use of an new treatment for mild to moderate COVID-19 viral infection in non-hospitalized patients.  This patient is a 44 y.o. female that meets the FDA criteria for Emergency Use Authorization of bamlanivimab or casirivimab\imdevimab.  Has a (+) direct SARS-CoV-2 viral test result  Has mild or moderate COVID-19   Is ? 45 years of age and weighs ? 40 kg  Is NOT hospitalized due to COVID-19  Is NOT requiring oxygen therapy or requiring an increase in baseline oxygen flow rate due to COVID-19  Is within 10 days of symptom onset  Has at least one of the high risk factor(s) for progression to severe COVID-19 and/or hospitalization as defined in EUA.  Specific high risk criteria : BMI >/= 35   I have spoken and communicated the following to the patient or parent/caregiver:  1. FDA has authorized the emergency use of bamlanivimab and casirivimab\imdevimab for the treatment of mild to moderate COVID-19 in adults and pediatric patients with positive results of direct SARS-CoV-2 viral testing who are 55 years of age and older weighing at least 40 kg, and who are at high risk for progressing to severe COVID-19 and/or hospitalization.  2. The significant known and potential risks and benefits of bamlanivimab and casirivimab\imdevimab, and the extent to which such potential risks and benefits are unknown.  3. Information on available alternative treatments and the risks and benefits of those alternatives, including clinical trials.  4. Patients treated with bamlanivimab and casirivimab\imdevimab should continue to self-isolate and use infection control measures (e.g., wear mask, isolate, social distance, avoid sharing personal items, clean and disinfect "high touch" surfaces, and frequent handwashing) according to CDC guidelines.   5. The patient or parent/caregiver has the option to accept or refuse  bamlanivimab or casirivimab\imdevimab .  After reviewing this information with the patient, The patient agreed to proceed with receiving the bamlanimivab infusion and will be provided a copy of the Fact sheet prior to receiving the infusion.Jennifer [name] 09/25/2019 8:35 AM

## 2019-09-26 ENCOUNTER — Encounter: Payer: Self-pay | Admitting: Rheumatology

## 2019-09-26 ENCOUNTER — Ambulatory Visit (HOSPITAL_COMMUNITY)
Admission: RE | Admit: 2019-09-26 | Discharge: 2019-09-26 | Disposition: A | Discharge status: Home / Self Care | Payer: 59 | Source: Ambulatory Visit | Attending: Pulmonary Disease | Admitting: Pulmonary Disease

## 2019-09-26 DIAGNOSIS — U071 COVID-19: Secondary | ICD-10-CM | POA: Diagnosis present

## 2019-09-26 DIAGNOSIS — Z6841 Body Mass Index (BMI) 40.0 and over, adult: Secondary | ICD-10-CM | POA: Diagnosis present

## 2019-09-26 DIAGNOSIS — Z23 Encounter for immunization: Secondary | ICD-10-CM | POA: Diagnosis not present

## 2019-09-26 MED ORDER — ALBUTEROL SULFATE HFA 108 (90 BASE) MCG/ACT IN AERS: 2.0000 | INHALATION_SPRAY | Freq: Once | RESPIRATORY_TRACT | Status: DC | PRN

## 2019-09-26 MED ORDER — DIPHENHYDRAMINE HCL 50 MG/ML IJ SOLN: 50.0000 mg | Freq: Once | INTRAMUSCULAR | Status: DC | PRN

## 2019-09-26 MED ORDER — SODIUM CHLORIDE 0.9 % IV SOLN
INTRAVENOUS | Status: DC | PRN
  Administered 2019-09-26: 17:00:00 250 mL via INTRAVENOUS

## 2019-09-26 MED ORDER — FAMOTIDINE IN NACL 20-0.9 MG/50ML-% IV SOLN: 20.0000 mg | Freq: Once | INTRAVENOUS | Status: DC | PRN

## 2019-09-26 MED ORDER — SODIUM CHLORIDE 0.9 % IV SOLN
700.0000 mg | Freq: Once | INTRAVENOUS | Status: AC
  Administered 2019-09-26: 700 mg via INTRAVENOUS
  Filled 2019-09-26: qty 20

## 2019-09-26 MED ORDER — METHYLPREDNISOLONE SODIUM SUCC 125 MG IJ SOLR: 125.0000 mg | Freq: Once | INTRAMUSCULAR | Status: DC | PRN

## 2019-09-26 MED ORDER — EPINEPHRINE 0.3 MG/0.3ML IJ SOAJ: 0.3000 mg | Freq: Once | INTRAMUSCULAR | Status: DC | PRN

## 2019-09-26 NOTE — Discharge Instructions (Signed)

## 2019-09-26 NOTE — Telephone Encounter (Signed)
Spoke with patient and patient would like to know if she can receive the infusion due to being on MTX and Cimzia. Patient states last dose of MTX was 09/19/2019. Patient is aware to hold medications right now.

## 2019-09-26 NOTE — Telephone Encounter (Signed)
Spoke with Dr. Estanislado Pandy and patient should be holding methotrexate and cimzia. Per Dr. Estanislado Pandy, she can receive the infusion her PCP/ pulmonary ordered since it is needed as long as she holds the medications. Advised patient and she verbalized understanding. Also advised patient to hold MTX and Cimzia until symptoms have resolved.

## 2019-09-26 NOTE — Progress Notes (Signed)
  Diagnosis: COVID-19  Physician: Dr Joya Gaskins  Procedure: [facility] Infusion Clinic Med: bamlanivimab infusion - Provided patient with bamlanimivab fact sheet for patients, parents and caregivers prior to infusion.  Complications: No immediate complications noted.  Discharge: Discharged home   Jennifer [name] 09/26/2019

## 2019-10-02 ENCOUNTER — Encounter: Payer: Self-pay | Admitting: Internal Medicine

## 2019-10-02 ENCOUNTER — Other Ambulatory Visit: Payer: Self-pay

## 2019-10-02 ENCOUNTER — Telehealth (INDEPENDENT_AMBULATORY_CARE_PROVIDER_SITE_OTHER): Payer: 59 | Admitting: Internal Medicine

## 2019-10-02 DIAGNOSIS — Z8616 Personal history of COVID-19: Secondary | ICD-10-CM | POA: Diagnosis not present

## 2019-10-02 DIAGNOSIS — Z79899 Other long term (current) drug therapy: Secondary | ICD-10-CM | POA: Diagnosis not present

## 2019-10-02 DIAGNOSIS — R519 Headache, unspecified: Secondary | ICD-10-CM | POA: Diagnosis not present

## 2019-10-02 DIAGNOSIS — M0579 Rheumatoid arthritis with rheumatoid factor of multiple sites without organ or systems involvement: Secondary | ICD-10-CM | POA: Diagnosis not present

## 2019-10-02 MED ORDER — ELETRIPTAN HYDROBROMIDE 40 MG PO TABS: 40.0000 mg | ORAL_TABLET | ORAL | 1 refills | Status: DC | PRN

## 2019-10-02 MED ORDER — CYCLOBENZAPRINE HCL 10 MG PO TABS: 10.0000 mg | ORAL_TABLET | Freq: Every day | ORAL | 0 refills | Status: DC

## 2019-10-02 NOTE — Progress Notes (Signed)
Virtual Visit via Video Note  I connected with@ on 10/02/19 at  9:30 AM EST by a video enabled telemedicine application and verified that I am speaking with the correct person using two identifiers. 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  Patient aware  of the limitations of evaluation and management by telemedicine and  availability of in person appointments. and agreed to proceed.   HPI: Jennifer [name] presents for video visit  She has covid and received  monoclonal aby infusion under [facility]    Has hx of mograines  Worsening recently and ( see phone messages)    [place] working": as much   Onset this headache dec 31 and continues   Dx with covid jan 4?  Now back to work   . But headaches  ( had 2 days without after infrusion? And now 7/10 still photophobia  But no NVD  And no acute neuro sx .  Maxalt not that helpful so was taking Excedrin migraine 2 bid but none for 2 days.   Remote hs of [facility] botox rx  But  Not recently .   Ha throbbing  Back of head neck and bitemporal now   BP is ok  ROS: See pertinent positives and negatives per HPI. No fever serious cough sinus pain  Past Medical History:  Diagnosis Date  . Acute otitis media 08/28/2012   improved  change to liquid medication   . Anxiety   . Breast abscess of female    Recurrent  . Depression   . Family history of adverse reaction to anesthesia    father hard time waking once (12/20/2016)  . Fibroids    w/bleeding  . GERD (gastroesophageal reflux disease)   . History of blood transfusion    after surgery  . Hypertension   . Migraines    Dr. Orie Rout; "I have a few/month" (12/20/2016)  . Osteoarthritis   . Pill esophagitis 08/28/2012   amoxicillin  by hx  disc plan nl voice except hoarse not drooling  close fu  if  not getting better with plan stop aleve  liquid ibu onlyf necessary   . Recurrent periodic urticaria   . Rheumatoid arthritis ([place])    "55% of my body" (12/20/2016)  . Sleep apnea    wears cpap    Past Surgical History:  Procedure Laterality Date  . BREAST SURGERY     abcess under left breast   . CYST EXCISION Left 2014   "elbow"  . HERNIA REPAIR    . INCISION AND DRAINAGE BREAST ABSCESS Left 2003  . INCISIONAL HERNIA REPAIR N/A 12/18/2016   Procedure: REPAIR INCISIONAL HERNIA WITH MESH;  Surgeon: Georganna Skeans, MD;  Location: [facility];  Service: General;  Laterality: N/A;  . INSERTION OF MESH N/A 12/18/2016   Procedure: INSERTION OF MESH;  Surgeon: Georganna Skeans, MD;  Location: [facility];  Service: General;  Laterality: N/A;  . KNEE ARTHROSCOPY WITH MENISCAL REPAIR Right 06/17/2018   Procedure: RIGHT KNEE ARTHROSCOPY WITH MENISCAL REPAIR;  Surgeon: Vickey Huger, MD;  Location: [facility];  Service: Orthopedics;  Laterality: Right;  . MYOMECTOMY N/A 09/06/2016   Procedure: ABOMINAL MYOMECTOMY;  Surgeon: Governor Specking, MD;  Location: [facility];  Service: Gynecology;  Laterality: N/A;  . UTERINE FIBROID SURGERY     35 fibroids  Family History  Problem Relation Age of Onset  . Hypertension Mother   . Rheum arthritis Mother   . Hypertension Father   . Sleep apnea Father     Social History   Tobacco Use  . Smoking status: Never Smoker  . Smokeless tobacco: Never Used  Substance Use Topics  . Alcohol use: Yes    Comment: social  . Drug use: Never      Current Outpatient Medications:  .  methotrexate (RHEUMATREX) 2.5 MG tablet, TAKE 8 TABS BY MOUTH ONCE WEEKLY (PROTECT FROM LIGHT), Disp: 96 tablet, Rfl: 0 .  amLODipine (NORVASC) 5 MG tablet, TAKE 1 TABLET BY MOUTH EVERY DAY, Disp: 90 tablet, Rfl: 1 .  CIMZIA 2 X 200 MG KIT, INJECT 400 MG UNDER THE SKIN EVERY 28 (TWENTY-EIGHT) DAYS, Disp: 1 each, Rfl: 2 .  clindamycin-benzoyl peroxide (BENZACLIN) gel, Apply 1 application topically  every other day. , Disp: , Rfl:  .  cyclobenzaprine (FLEXERIL) 10 MG tablet, Take 1 tablet (10 mg total) by mouth at bedtime., Disp: 20 tablet, Rfl: 0 .  diclofenac sodium (VOLTAREN) 1 % GEL, APPLY 4 G TOPICALLY 2 (TWO) TIMES DAILY., Disp: 400 g, Rfl: 3 .  ELDERBERRY PO, Take by mouth daily., Disp: , Rfl:  .  eletriptan (RELPAX) 40 MG tablet, Take 1 tablet (40 mg total) by mouth as needed for migraine or headache. May repeat in 2 hours if headache persists or recurs., Disp: 10 tablet, Rfl: 1 .  fexofenadine (ALLEGRA) 180 MG tablet, Take 180 mg by mouth daily as needed. , Disp: , Rfl:  .  fluocinonide ointment (LIDEX) 4.19 %, Apply 1 application topically 2 (two) times daily. , Disp: , Rfl: 5 .  folic acid (FOLVITE) 1 MG tablet, Take 2 tablets (2 mg total) by mouth daily., Disp: 180 tablet, Rfl: 2 .  Levonorgestrel (KYLEENA) 19.5 MG IUD, Kyleena 17.5 mcg/24 hrs (45yr) 19.541mintrauterine device  Take 1 device by intrauterine route., Disp: , Rfl:  .  Melatonin 10 MG CAPS, Take 20 mg by mouth at bedtime as needed (for sleep)., Disp: , Rfl:  .  methocarbamol (ROBAXIN) 500 MG tablet, Take 500 mg by mouth 2 (two) times daily between meals as needed for muscle spasms., Disp: , Rfl:  .  Multiple Vitamins-Minerals (MULTIVITAMIN PO), Take by mouth daily., Disp: , Rfl:  .  naproxen (NAPROSYN) 500 MG tablet, naproxen 500 mg tablet, Disp: , Rfl:  .  OVER THE COUNTER MEDICATION, 3 capsules daily., Disp: , Rfl:  .  predniSONE (DELTASONE) 5 MG tablet, Take 4 tabs po qd x 4 days, 3  tabs po qd x 4 days, 2  tabs po qd x 4 days, 1  tab po qd x 4 days, Disp: 40 tablet, Rfl: 0 .  rizatriptan (MAXALT-MLT) 10 MG disintegrating tablet, Take 1 tablet (10 mg total) by mouth as needed for migraine. May repeat in 2 hours if needed, Disp: 3 tablet, Rfl: 0  EXAM: BP Readings from Last 3 Encounters:  09/26/19 129/86  09/20/19 (!) 155/96  09/08/19 (!) 160/100    VITALS per patient if applicable:  GENERAL: alert, oriented,  appears well and in no acute distress  HEENT: atraumatic, conjunttiva clear, no obvious abnormalities on inspection of external nose and ears  NECK: normal movements of the head and neck  LUNGS: on inspection no signs of respiratory distress, breathing rate appears normal, no obvious gross SOB, gasping or wheezing  CV: no obvious cyanosis  PSYCH/NEURO: pleasant and cooperative,  no obvious depression or anxiety, speech and thought processing grossly intact Lab Results  Component Value Date   WBC 4.9 08/08/2019   HGB 11.7 08/08/2019   HCT 36.1 08/08/2019   PLT 353 08/08/2019   GLUCOSE 87 08/08/2019   CHOL 189 06/11/2018   TRIG 52 06/11/2018   HDL 71 06/11/2018   LDLCALC 108 (H) 06/11/2018   ALT 9 08/08/2019   AST 13 08/08/2019   NA 138 08/08/2019   K 4.3 08/08/2019   CL 102 08/08/2019   CREATININE 0.85 08/08/2019   BUN 12 08/08/2019   CO2 25 08/08/2019   TSH 1.950 06/11/2018   INR 1.20 09/07/2016   HGBA1C 5.6 06/11/2018    ASSESSMENT AND PLAN:  Discussed the following assessment and plan:    ICD-10-CM   1. Worsening headaches  R51.9 Ambulatory referral to Neurology  2. History of 2019 novel coronavirus disease (COVID-19)  Z86.16 Ambulatory referral to Neurology  3. Rheumatoid arthritis involving multiple sites with positive rheumatoid factor ([place])  M05.79 Ambulatory referral to Neurology  4. High risk medication use  Z79.899 Ambulatory referral to Neurology   Ongoing headaches since dec 31 some waxing waning  Seems to coincide with dx covid 19  with poss aggravation  Upon baseline  Migraines now more frequent  ( baseline 3 x per month)  No other  Neuro sx   Trial relpax  But advise  Help with headache  Dx and management   She may be a candidate for other meds  Suppression .  But cannot say this is all migraine. since some neck pain and bitemporal trial muscle relaxant at night in addition with rx for relpax  . Uncertain need for imaging.   She has underlying rheum  disease and high risk med  rx     Expectant management and discussion of plan and treatment with opportunity to ask questions and all were answered. The patient agreed with the plan and demonstrated an understanding of the instructions.   Advised to call back or seek an in-person evaluation if worsening  or having  further concerns . Return if symptoms worsen or fail to improve, for referral to neuro for help with dx and managment .  Shanon Ace, MD

## 2019-10-03 NOTE — Progress Notes (Signed)
Virtual Visit via Video Note  I connected with Jennifer [name] on 10/08/19 at  8:15 AM EST by a video enabled telemedicine application and verified that I am speaking with the correct person using two identifiers.  Location: Patient: Home Provider: Clinic  This service was conducted via virtual visit.  Both audio and visual tools were used.  The patient was located at home. I was located in my office.  Consent was obtained prior to the virtual visit and is aware of possible charges through their insurance for this visit.  The patient is an established patient.  Dr. Estanislado Pandy, MD conducted the virtual visit .  Office staff helped with scheduling follow up visits after the service was conducted.   I discussed the limitations of evaluation and management by telemedicine and the availability of in person appointments. The patient expressed understanding and agreed to proceed.  CC: History of Present Illness: Jennifer [name] is a 44 y.o. female with history of seropositive rheumatoid arthritis and osteoarthritis.  Patient is on Cimzia 40 mg in office injections every 28 days, methotrexate 6 tablets by mouth once weekly, folic acid 2 mg by mouth daily.  Patient developed COVID-19 infection on January 1.  She states she had to have IV infusion for the COVID-19.  The methotrexate and Cimzia were stopped.  She states she has done fairly well but her right knee joint continues to swell and is painful.  She has been having difficulty walking.  She is trying to lose weight and has not gained any weight.  She is requesting a prednisone taper until she can resume her medications.  She states she still feels tired congested and have headaches.  She has seen a neurologist for the headaches.  None of the other joints are painful.    Review of Systems  Constitutional: Negative for malaise/fatigue.  HENT: Positive for congestion.   Eyes: Negative for pain.  Respiratory: Negative for shortness of breath.    Cardiovascular: Negative for leg swelling.  Gastrointestinal: Negative for constipation.  Genitourinary: Negative for urgency.  Musculoskeletal: Positive for joint pain.       Knee joint swelling  Skin: Negative for rash.  Neurological: Positive for weakness.  Endo/Heme/Allergies: Does not bruise/bleed easily.  Psychiatric/Behavioral: Negative for memory loss.   Observations/Objective:  Physical Exam  Constitutional: She is oriented to person, place, and time and well-developed, well-nourished, and in no distress.  HENT:  Head: Normocephalic and atraumatic.  Eyes: Conjunctivae are normal.  Pulmonary/Chest: Effort normal.  Neurological: She is alert and oriented to person, place, and time.  Psychiatric: Mood, memory, affect and judgment normal.   Patient reports morning stiffness for 2 hours.   Patient reports nocturnal pain.  Difficulty dressing/grooming: Denies Difficulty climbing stairs: Reports Difficulty getting out of chair: Denies Difficulty using hands for taps, buttons, cutlery, and/or writing: Denies  Assessment and Plan: Visit Diagnoses: Rheumatoid arthritis involving multiple sites with positive rheumatoid factor (HCC) - +CCP, +HLA B27:  Patient had been doing well on Cimzia 400 mg subcu every 48 days of methotrexate 6 tablets/week combination which she had to stop at the onset of COVID-19 infection.  She states she is a still symptomatic with some headaches fatigue and some congestion.  We will hold off Cimzia and methotrexate at this time.  She continues to have pain and swelling in her right knee joint.  She is having difficulty climbing the stairs.  She requested prednisone taper.  We will send in a prednisone taper starting  at 10 mg p.o. weekly and taper by 2.5 mg every week.  My plan is to resume Cimzia in February and methotrexate when she is asymptomatic.  I have advised patient to call us towards the end of January to resume and schedule Cimzia injection.  High  risk medication use - Cimzia 400 mg in office every 28 days, methotrexate 6 tablets every 7 days, and folic acid 1 mg 2 tablets daily. Last TB gold negative on 03/14/2019 and will monitor yearly.  Her labs are up-to-date.  Her last labs were in November.  Next labs will be due in February.  Primary osteoarthritis of both hands-she is currently not having much discomfort.  Primary osteoarthritis of both knees-she has chronic pain in her knee joints and currently having discomfort and swelling.  S/P right knee arthroscopy  COVID-19 infection-patient acquired infection in the beginning of January and is gradually recovering from that.  High BMI-weight loss diet and exercise was discussed.  Patient is trying weight loss.  Essential hypertension  Follow Up Instructions: She will follow up in 3 months.   I discussed the assessment and treatment plan with the patient. The patient was provided an opportunity to ask questions and all were answered. The patient agreed with the plan and demonstrated an understanding of the instructions.   The patient was advised to call back or seek an in-person evaluation if the symptoms worsen or if the condition fails to improve as anticipated.  I provided 25 minutes of non-face-to-face time during this encounter.   Bo Merino, MD

## 2019-10-08 ENCOUNTER — Encounter: Payer: Self-pay | Admitting: Rheumatology

## 2019-10-08 ENCOUNTER — Telehealth (INDEPENDENT_AMBULATORY_CARE_PROVIDER_SITE_OTHER): Payer: 59 | Admitting: Rheumatology

## 2019-10-08 ENCOUNTER — Other Ambulatory Visit: Payer: Self-pay

## 2019-10-08 DIAGNOSIS — M0579 Rheumatoid arthritis with rheumatoid factor of multiple sites without organ or systems involvement: Secondary | ICD-10-CM | POA: Diagnosis not present

## 2019-10-08 DIAGNOSIS — Z9889 Other specified postprocedural states: Secondary | ICD-10-CM

## 2019-10-08 DIAGNOSIS — M19041 Primary osteoarthritis, right hand: Secondary | ICD-10-CM | POA: Diagnosis not present

## 2019-10-08 DIAGNOSIS — M19042 Primary osteoarthritis, left hand: Secondary | ICD-10-CM

## 2019-10-08 DIAGNOSIS — Z79899 Other long term (current) drug therapy: Secondary | ICD-10-CM | POA: Diagnosis not present

## 2019-10-08 DIAGNOSIS — I10 Essential (primary) hypertension: Secondary | ICD-10-CM

## 2019-10-08 DIAGNOSIS — M170 Bilateral primary osteoarthritis of knee: Secondary | ICD-10-CM | POA: Diagnosis not present

## 2019-10-08 MED ORDER — PREDNISONE 5 MG PO TABS: ORAL_TABLET | ORAL | 0 refills | Status: DC

## 2019-10-08 NOTE — Addendum Note (Signed)
Addended by: Earnestine Mealing on: 10/08/2019 09:22 AM   Modules accepted: Orders

## 2019-10-16 ENCOUNTER — Other Ambulatory Visit: Payer: Self-pay | Admitting: Family Medicine

## 2019-10-16 ENCOUNTER — Other Ambulatory Visit: Payer: Self-pay | Admitting: Internal Medicine

## 2019-10-16 ENCOUNTER — Other Ambulatory Visit: Payer: Self-pay

## 2019-10-16 ENCOUNTER — Encounter: Payer: Self-pay | Admitting: Family Medicine

## 2019-10-16 ENCOUNTER — Telehealth (INDEPENDENT_AMBULATORY_CARE_PROVIDER_SITE_OTHER): Payer: 59 | Admitting: Family Medicine

## 2019-10-16 DIAGNOSIS — R059 Cough, unspecified: Secondary | ICD-10-CM

## 2019-10-16 DIAGNOSIS — R062 Wheezing: Secondary | ICD-10-CM | POA: Diagnosis not present

## 2019-10-16 DIAGNOSIS — D849 Immunodeficiency, unspecified: Secondary | ICD-10-CM | POA: Diagnosis not present

## 2019-10-16 DIAGNOSIS — Z8616 Personal history of COVID-19: Secondary | ICD-10-CM | POA: Diagnosis not present

## 2019-10-16 DIAGNOSIS — R05 Cough: Secondary | ICD-10-CM

## 2019-10-16 MED ORDER — DOXYCYCLINE HYCLATE 100 MG PO TABS: 100.0000 mg | ORAL_TABLET | Freq: Two times a day (BID) | ORAL | 0 refills | Status: DC

## 2019-10-16 MED ORDER — BENZONATATE 100 MG PO CAPS: 100.0000 mg | ORAL_CAPSULE | Freq: Three times a day (TID) | ORAL | 0 refills | Status: DC | PRN

## 2019-10-16 MED ORDER — ALBUTEROL SULFATE HFA 108 (90 BASE) MCG/ACT IN AERS: 2.0000 | INHALATION_SPRAY | Freq: Four times a day (QID) | RESPIRATORY_TRACT | 0 refills | Status: DC | PRN

## 2019-10-16 NOTE — Patient Instructions (Signed)
-  I sent the medication(s) we discussed to your pharmacy: Meds ordered this encounter  Medications  . albuterol (VENTOLIN HFA) 108 (90 Base) MCG/ACT inhaler    Sig: Inhale 2 puffs into the lungs every 6 (six) hours as needed.    Dispense:  8.5 g    Refill:  0  . benzonatate (TESSALON PERLES) 100 MG capsule    Sig: Take 1 capsule (100 mg total) by mouth 3 (three) times daily as needed.    Dispense:  20 capsule    Refill:  0  . doxycycline (VIBRA-TABS) 100 MG tablet    Sig: Take 1 tablet (100 mg total) by mouth 2 (two) times daily.    Dispense:  14 tablet    Refill:  0    Please let us know if you have any questions or concerns regarding this prescription.  I hope you are feeling better soon! Seek care promptly if your symptoms worsen, new concerns arise or you are not improving with treatment.

## 2019-10-16 NOTE — Progress Notes (Signed)
Virtual Visit via Video Note  I connected with Jennifer [name]  on 10/16/19 at  3:40 PM EST by a video enabled telemedicine application and verified that I am speaking with the correct person using two identifiers.  Location patient: home Location provider:work or home office Persons participating in the virtual visit: patient, provider  I discussed the limitations of evaluation and management by telemedicine and the availability of in person appointments. The patient expressed understanding and agreed to proceed.   HPI:  Acute visit for Cough -had COVID earlier this month, positive test on January 1st -has had persistent cough for two weeks, seems to be worsening, at work this is an issue -has also noticed a burning sensation in the upper chest and mild SOB sometimes at night when she is coughing a lot, occ wheeze -reports has had to use inhalers earlier in her life with resp illness -non productive, but feels congested in throat/chest -denies SOB during the day or when she is up and around, swelling in the legs, fevers, hemoptysis -BP 140s/90 - this is " actually good for me" she reports -She is on prednisone with her rheumatologist right now, mtx was stopped during her COVID infection -she is worried about another infection like walking pneumonia or bronchitis  ROS: See pertinent positives and negatives per HPI.  Past Medical History:  Diagnosis Date  . Acute otitis media 08/28/2012   improved  change to liquid medication   . Anxiety   . Breast abscess of female    Recurrent  . Depression   . Family history of adverse reaction to anesthesia    father hard time waking once (12/20/2016)  . Fibroids    w/bleeding  . GERD (gastroesophageal reflux disease)   . History of blood transfusion    after surgery  . Hypertension   . Migraines    Dr. Orie Rout; "I have a few/month" (12/20/2016)  . Osteoarthritis   . Pill esophagitis 08/28/2012   amoxicillin  by hx  disc plan nl voice  except hoarse not drooling  close fu  if not getting better with plan stop aleve  liquid ibu onlyf necessary   . Recurrent periodic urticaria   . Rheumatoid arthritis ([place])    "55% of my body" (12/20/2016)  . Sleep apnea    wears cpap    Past Surgical History:  Procedure Laterality Date  . BREAST SURGERY     abcess under left breast   . CYST EXCISION Left 2014   "elbow"  . HERNIA REPAIR    . INCISION AND DRAINAGE BREAST ABSCESS Left 2003  . INCISIONAL HERNIA REPAIR N/A 12/18/2016   Procedure: REPAIR INCISIONAL HERNIA WITH MESH;  Surgeon: Georganna Skeans, MD;  Location: [facility];  Service: General;  Laterality: N/A;  . INSERTION OF MESH N/A 12/18/2016   Procedure: INSERTION OF MESH;  Surgeon: Georganna Skeans, MD;  Location: [facility];  Service: General;  Laterality: N/A;  . KNEE ARTHROSCOPY WITH MENISCAL REPAIR Right 06/17/2018   Procedure: RIGHT KNEE ARTHROSCOPY WITH MENISCAL REPAIR;  Surgeon: Vickey Huger, MD;  [facility];  Service: Orthopedics;  Laterality: Right;  . MYOMECTOMY N/A 09/06/2016   Procedure: ABOMINAL MYOMECTOMY;  Surgeon: Governor Specking, MD;  Location: [facility];  Service: Gynecology;  Laterality: N/A;  . UTERINE FIBROID SURGERY     35 fibroids    Family History  Problem Relation Age of Onset  . Hypertension Mother   . Rheum arthritis Mother   . Hypertension Father   .  Sleep apnea Father     SOCIAL HX: see hpi   Current Outpatient Medications:  .  amLODipine (NORVASC) 5 MG tablet, TAKE 1 TABLET BY MOUTH EVERY DAY, Disp: 90 tablet, Rfl: 1 .  CIMZIA 2 X 200 MG KIT, INJECT 400 MG UNDER THE SKIN EVERY 28 (TWENTY-EIGHT) DAYS, Disp: 1 each, Rfl: 2 .  clindamycin-benzoyl peroxide (BENZACLIN) gel, Apply 1 application topically every other day. , Disp: , Rfl:  .  cyclobenzaprine (FLEXERIL) 10 MG tablet, Take 1 tablet (10 mg total) by mouth at bedtime., Disp: 20 tablet, Rfl: 0 .  diclofenac sodium (VOLTAREN) 1 % GEL, APPLY 4 G TOPICALLY 2 (TWO) TIMES DAILY., Disp: 400 g, Rfl:  3 .  ELDERBERRY PO, Take by mouth daily., Disp: , Rfl:  .  eletriptan (RELPAX) 40 MG tablet, Take 1 tablet (40 mg total) by mouth as needed for migraine or headache. May repeat in 2 hours if headache persists or recurs., Disp: 10 tablet, Rfl: 1 .  fluocinonide ointment (LIDEX) 3.41 %, Apply 1 application topically 2 (two) times daily. , Disp: , Rfl: 5 .  folic acid (FOLVITE) 1 MG tablet, Take 2 tablets (2 mg total) by mouth daily., Disp: 180 tablet, Rfl: 2 .  Levonorgestrel (KYLEENA) 19.5 MG IUD, Kyleena 17.5 mcg/24 hrs (2yr) 19.530mintrauterine device  Take 1 device by intrauterine route., Disp: , Rfl:  .  Melatonin 10 MG CAPS, Take 20 mg by mouth at bedtime as needed (for sleep)., Disp: , Rfl:  .  methocarbamol (ROBAXIN) 500 MG tablet, Take 500 mg by mouth 2 (two) times daily between meals as needed for muscle spasms., Disp: , Rfl:  .  methotrexate (RHEUMATREX) 2.5 MG tablet, TAKE 8 TABS BY MOUTH ONCE WEEKLY (PROTECT FROM LIGHT), Disp: 96 tablet, Rfl: 0 .  naproxen (NAPROSYN) 500 MG tablet, as needed. , Disp: , Rfl:  .  OVER THE COUNTER MEDICATION, 3 capsules daily., Disp: , Rfl:  .  predniSONE (DELTASONE) 5 MG tablet, Take 2 tabs po qd x 7 days, take 1.5 tabs po qd x 7 days, take 1 tab po qd x 7 days, take 1/2 tab po qd x 7 days., Disp: 35 tablet, Rfl: 0 .  rizatriptan (MAXALT-MLT) 10 MG disintegrating tablet, Take 1 tablet (10 mg total) by mouth as needed for migraine. May repeat in 2 hours if needed, Disp: 3 tablet, Rfl: 0 .  albuterol (VENTOLIN HFA) 108 (90 Base) MCG/ACT inhaler, Inhale 2 puffs into the lungs every 6 (six) hours as needed., Disp: 8.5 g, Rfl: 0 .  benzonatate (TESSALON PERLES) 100 MG capsule, Take 1 capsule (100 mg total) by mouth 3 (three) times daily as needed., Disp: 20 capsule, Rfl: 0 .  doxycycline (VIBRA-TABS) 100 MG tablet, Take 1 tablet (100 mg total) by mouth 2 (two) times daily., Disp: 14 tablet, Rfl: 0  EXAM:  VITALS per patient if applicable:see hpi  GENERAL:  alert, oriented, appears well and in no acute distress  HEENT: atraumatic, conjunttiva clear, no obvious abnormalities on inspection of external nose and ears  NECK: normal movements of the head and neck  LUNGS: on inspection no signs of respiratory distress, breathing rate appears normal, no obvious gross SOB, gasping or wheezing  CV: no obvious cyanosis  MS: moves all visible extremities without noticeable abnormality  PSYCH/NEURO: pleasant and cooperative, no obvious depression or anxiety, speech and thought processing grossly intact  ASSESSMENT AND PLAN:  Discussed the following assessment and plan:  Cough  Wheeze  History  of 2019 novel coronavirus disease (COVID-19)  Immunocompromised ([place])  -we discussed possible serious and likely etiologies, options for evaluation and workup, limitations of telemedicine visit vs in person visit, treatment, treatment risks and precautions. Pt prefers to treat via telemedicine empirically rather then risking or undertaking an in person visit at this moment. Query ongoing symptoms from COVID/lung irritation, new infection, bronchitis, vs other. She opted for trying empiric treatment with doxycycline 156m bid and alb prn. Cont prednisone taper from rheum and cough medication. Patient agrees to seek prompt in person care if worsening, new symptoms arise, or if is not improving with treatment. Discussed potential complications (some serious) and longer term possible sequelae of COVID19.   I discussed the assessment and treatment plan with the patient. The patient was provided an opportunity to ask questions and all were answered. The patient agreed with the plan and demonstrated an understanding of the instructions.   The patient was advised to call back or seek an in-person evaluation if the symptoms worsen or if the condition fails to improve as anticipated.   HLucretia Kern DO

## 2019-10-17 NOTE — Telephone Encounter (Signed)
Last office :10/02/19 Last filled:10/02/19

## 2019-11-03 ENCOUNTER — Other Ambulatory Visit: Payer: Self-pay | Admitting: Rheumatology

## 2019-11-06 NOTE — Progress Notes (Signed)
Virtual Visit via Video Note The purpose of this virtual visit is to provide medical care while limiting exposure to the novel coronavirus.    Consent was obtained for video visit:  Yes Answered questions that patient had about telehealth interaction:  Yes I discussed the limitations, risks, security and privacy concerns of performing an evaluation and management service by telemedicine. I also discussed with the patient that there may be a patient responsible charge related to this service. The patient expressed understanding and agreed to proceed.  Pt location: Home Physician Location: office Name of referring provider:  Burnis Medin, MD I connected with [name] at patients initiation/request on 11/10/2019 at  7:50 AM EST by video enabled telemedicine application and verified that I am speaking with the correct person using two identifiers. Pt MRN:  [id number] Pt DOB:  01-30-1976 Video Participants:  [name]   History of Present Illness:  Jennifer [name]. Jennifer [name] is a 44 year old female with rheumatoid arthritis who presents for headaches.  History supplemented by referring provider note.  She started having migraines in her late-20s to early-30s.  They are severe mid-frontal/bitemporal/back of neck throbbing pain.  They are associated with seeing white spots when severe, photophobia, phonophobia, rarely nausea..  Her migraines typically last 2 hours to 2 days and occur 2 to 3 times a month.  Triggers include stress and fluorescent lights.    She developed a migraine on 09/18/2019, however it was persistent.  She was subsequently diagnosed with Covid a few days later.  The migraine did not break.  She was prescribed a prednisone taper on 10/08/2019 and the migraine broke.  She currently reports increased family-related stress.    Current NSAIDS:  Naproxen 588m Current analgesics:  Excedrin Migraine Current triptans:  Eletriptan 419mCurrent ergotamine:  none Current  anti-emetic:  none Current muscle relaxants:  Flexeril Current anti-anxiolytic:  none Current sleep aide:  Melatonin 109mRN Current Antihypertensive medications:  amlodipine Current Antidepressant medications:  none Current Anticonvulsant medications:  none Current anti-CGRP:  none Current Vitamins/Herbal/Supplements:  Folic acid; melatonin 43m58ITN Current Antihistamines/Decongestants:  none Other therapy:  none Hormone/birth control:  Kyleena Other medications:  methotrexate  Past NSAIDS/ steroids:  Ibuprofen 800m36mrednisone Past analgesics:  Tramadol Past abortive triptans: Maxalt MLT 43mg52mmatriptan 20mg 68mast abortive ergotamine:  none Past muscle relaxants:  Robaxin Past anti-emetic:  none Past antihypertensive medications:  Labetolol; lisinopril-HCTZ Past antidepressant medications:  Doxepin 43mg B69mhives); fluoxetine 43mg Pa57mnticonvulsant medications:  zonisamide 100mg dai29mtopiramate (stopped due to potential memory problems) Past anti-CGRP:  none Past vitamins/Herbal/Supplements:  none Past antihistamines/decongestants:  none Other past therapies:  none  Caffeine:  1 to 2 cups of coffee daily.  No soda or tea Diet:  No soda.  24 to 48 oz of water daily.  Currently on military diet to lose weight for knee surgery.   Exercise:  Sit downs on Saturday and Sunday, limited due to needing knee surgery Depression:  no; Anxiety:  Yes (family-related Other pain:  Knee pain Sleep hygiene:  Poor.  She was diagnosed with OSA and uses a CPAP.  However, she can't turn off her mind.  4 to 6 hours of sleep a night. Family history of headache:  Aunt (used to have migraines)  Past Medical History: Past Medical History:  Diagnosis Date  . Acute otitis media 08/28/2012   improved  change to liquid medication   . Anxiety   . Breast abscess of  female    Recurrent  . Depression   . Family history of adverse reaction to anesthesia    father hard time waking once  (12/20/2016)  . Fibroids    w/bleeding  . GERD (gastroesophageal reflux disease)   . History of blood transfusion    after surgery  . Hypertension   . Migraines    Dr. Orie Rout; "I have a few/month" (12/20/2016)  . Osteoarthritis   . Pill esophagitis 08/28/2012   amoxicillin  by hx  disc plan nl voice except hoarse not drooling  close fu  if not getting better with plan stop aleve  liquid ibu onlyf necessary   . Recurrent periodic urticaria   . Rheumatoid arthritis ([place])    "55% of my body" (12/20/2016)  . Sleep apnea    wears cpap    Medications: Outpatient Encounter Medications as of 11/10/2019  Medication Sig  . cyclobenzaprine (FLEXERIL) 10 MG tablet TAKE 1 TABLET BY MOUTH EVERYDAY AT BEDTIME  . albuterol (VENTOLIN HFA) 108 (90 Base) MCG/ACT inhaler Inhale 2 puffs into the lungs every 6 (six) hours as needed for wheezing or shortness of breath.  [name] amLODipine (NORVASC) 5 MG tablet TAKE 1 TABLET BY MOUTH EVERY DAY  . benzonatate (TESSALON PERLES) 100 MG capsule Take 1 capsule (100 mg total) by mouth 3 (three) times daily as needed.  [name] CIMZIA 2 X 200 MG KIT INJECT 400 MG UNDER THE SKIN EVERY 28 (TWENTY-EIGHT) DAYS  . clindamycin-benzoyl peroxide (BENZACLIN) gel Apply 1 application topically every other day.   . diclofenac sodium (VOLTAREN) 1 % GEL APPLY 4 G TOPICALLY 2 (TWO) TIMES DAILY.  [name] doxycycline (VIBRA-TABS) 100 MG tablet Take 1 tablet (100 mg total) by mouth 2 (two) times daily.  [name] ELDERBERRY PO Take by mouth daily.  [name] eletriptan (RELPAX) 40 MG tablet Take 1 tablet (40 mg total) by mouth as needed for migraine or headache. May repeat in 2 hours if headache persists or recurs.  . fluocinonide ointment (LIDEX) 5.17 % Apply 1 application topically 2 (two) times daily.   . folic acid (FOLVITE) 1 MG tablet Take 2 tablets (2 mg total) by mouth daily.  . Levonorgestrel (KYLEENA) 19.5 MG IUD Kyleena 17.5 mcg/24 hrs (47yr) 19.53mintrauterine device  Take 1 device by intrauterine  route.  . Melatonin 10 MG CAPS Take 20 mg by mouth at bedtime as needed (for sleep).  . methocarbamol (ROBAXIN) 500 MG tablet Take 500 mg by mouth 2 (two) times daily between meals as needed for muscle spasms.  . methotrexate (RHEUMATREX) 2.5 MG tablet TAKE 8 TABS BY MOUTH ONCE WEEKLY (PROTECT FROM LIGHT)  . naproxen (NAPROSYN) 500 MG tablet as needed.   . Marland KitchenVER THE COUNTER MEDICATION 3 capsules daily.  . predniSONE (DELTASONE) 5 MG tablet Take 2 tabs po qd x 7 days, take 1.5 tabs po qd x 7 days, take 1 tab po qd x 7 days, take 1/2 tab po qd x 7 days.  . rizatriptan (MAXALT-MLT) 10 MG disintegrating tablet Take 1 tablet (10 mg total) by mouth as needed for migraine. May repeat in 2 hours if needed   No facility-administered encounter medications on file as of 11/10/2019.    Allergies: Allergies  Allergen Reactions  . Lisinopril-Hydrochlorothiazide Swelling and Other (See Comments)    Angioedema  Has tolerated maxide in past so most likely  The ACE inhibitor as the cause     Family History: Family History  Problem Relation Age of Onset  .  Hypertension Mother   . Rheum arthritis Mother   . Hypertension Father   . Sleep apnea Father     Social History: Social History   Socioeconomic History  . Marital status: Significant Other    Spouse name: Not on file  . Number of children: Not on file  . Years of education: Not on file  . Highest education level: Not on file  Occupational History  . Not on file  Tobacco Use  . Smoking status: Never Smoker  . Smokeless tobacco: Never Used  Substance and Sexual Activity  . Alcohol use: Yes    Comment: social  . Drug use: Never  . Sexual activity: Not Currently    Birth control/protection: None  Other Topics Concern  . Not on file  Social History Narrative  . Not on file   Social Determinants of Health   Financial Resource Strain:   . Difficulty of Paying Living Expenses: Not on file  Food Insecurity:   . Worried About Paediatric nurse in the Last Year: Not on file  . Ran Out of Food in the Last Year: Not on file  Transportation Needs:   . Lack of Transportation (Medical): Not on file  . Lack of Transportation (Non-Medical): Not on file  Physical Activity:   . Days of Exercise per Week: Not on file  . Minutes of Exercise per Session: Not on file  Stress:   . Feeling of Stress : Not on file  Social Connections:   . Frequency of Communication with Friends and Family: Not on file  . Frequency of Social Gatherings with Friends and Family: Not on file  . Attends Religious Services: Not on file  . Active Member of Clubs or Organizations: Not on file  . Attends Archivist Meetings: Not on file  . Marital Status: Not on file  Intimate Partner Violence:   . Fear of Current or Ex-Partner: Not on file  . Emotionally Abused: Not on file  . Physically Abused: Not on file  . Sexually Abused: Not on file    Observations/Objective:   There were no vitals taken for this visit. No acute distress.  Alert and oriented.  Speech fluent and not dysarthric.  Language intact.  Eyes orthophoric on primary gaze.  Face symmetric.  Assessment and Plan:   1.  Migraine without aura, without status migrainosus, not intractable.  She had one episode of status migrainosus last month triggered by Covid.  Now at baseline.    1.  For abortive therapy, she will try Tosymra as she responded well to sumatriptan NS in the past.  She will stop eletriptan 2.  We will defer preventative medication at this time, as she is back to baseline (2 to 3 a month) 3.  Limit use of pain relievers to no more than 2 days out of week to prevent risk of rebound or medication-overuse headache. 4.  Keep headache diary 5.  Exercise, hydration, caffeine cessation, sleep hygiene, monitor for and avoid triggers 6.  She will try magnesium citrate 418m daily and riboflavin 4057mdaily 7. Follow up 4 months   Follow Up Instructions:    -I discussed  the assessment and treatment plan with the patient. The patient was provided an opportunity to ask questions and all were answered. The patient agreed with the plan and demonstrated an understanding of the instructions.   The patient was advised to call back or seek an in-person evaluation if the symptoms worsen or  if the condition fails to improve as anticipated.   Dudley Major, DO

## 2019-11-07 ENCOUNTER — Other Ambulatory Visit: Payer: Self-pay | Admitting: Family Medicine

## 2019-11-07 ENCOUNTER — Encounter: Payer: Self-pay | Admitting: Neurology

## 2019-11-10 ENCOUNTER — Telehealth (INDEPENDENT_AMBULATORY_CARE_PROVIDER_SITE_OTHER): Payer: 59 | Admitting: Neurology

## 2019-11-10 ENCOUNTER — Encounter: Payer: Self-pay | Admitting: Neurology

## 2019-11-10 ENCOUNTER — Other Ambulatory Visit: Payer: Self-pay

## 2019-11-10 DIAGNOSIS — G43009 Migraine without aura, not intractable, without status migrainosus: Secondary | ICD-10-CM | POA: Diagnosis not present

## 2019-11-10 MED ORDER — TOSYMRA 10 MG/ACT NA SOLN: 1.0000 | NASAL | 11 refills | Status: DC | PRN

## 2019-11-23 ENCOUNTER — Other Ambulatory Visit: Payer: Self-pay | Admitting: Rheumatology

## 2019-11-24 ENCOUNTER — Telehealth: Payer: Self-pay | Admitting: *Deleted

## 2019-11-24 NOTE — Telephone Encounter (Signed)
Last Visit: 10/08/19 Next Visit: 01/08/20 Labs: 08/08/19 CBC WNL. CMP stable.   Attempted to contact the patient and left message for patient to advise she is due to update labs.  Okay to refill 30 day supply per Dr. Estanislado Pandy

## 2019-11-24 NOTE — Telephone Encounter (Signed)
Attempted to contact the patient and left message for patient to call the office.  

## 2019-11-24 NOTE — Telephone Encounter (Signed)
-----   Message from [place] sent at 09/22/2019  8:34 AM EST ----- Please schedule patient's next in office Cimzia injection. Thanks!

## 2019-11-25 ENCOUNTER — Other Ambulatory Visit: Payer: Self-pay

## 2019-11-25 ENCOUNTER — Ambulatory Visit: Payer: 59

## 2019-11-25 ENCOUNTER — Other Ambulatory Visit: Payer: Self-pay | Admitting: *Deleted

## 2019-11-25 DIAGNOSIS — Z79899 Other long term (current) drug therapy: Secondary | ICD-10-CM

## 2019-11-25 LAB — COMPLETE METABOLIC PANEL WITH GFR
AG Ratio: 1 (calc) (ref 1.0–2.5)
ALT: 9 U/L (ref 6–29)
AST: 12 U/L (ref 10–30)
Albumin: 3.9 g/dL (ref 3.6–5.1)
Alkaline phosphatase (APISO): 80 U/L (ref 31–125)
BUN: 16 mg/dL (ref 7–25)
CO2: 28 mmol/L (ref 20–32)
Calcium: 9.4 mg/dL (ref 8.6–10.2)
Chloride: 102 mmol/L (ref 98–110)
Creat: 0.96 mg/dL (ref 0.50–1.10)
GFR, Est African American: 83 mL/min/{1.73_m2} (ref >=60)
GFR, Est Non African American: 72 mL/min/{1.73_m2} (ref >=60)
Globulin: 4 g/dL (calc) — ABNORMAL HIGH (ref 1.9–3.7)
Glucose, Bld: 95 mg/dL (ref 65–99)
Potassium: 4.3 mmol/L (ref 3.5–5.3)
Sodium: 137 mmol/L (ref 135–146)
Total Bilirubin: 0.5 mg/dL (ref 0.2–1.2)
Total Protein: 7.9 g/dL (ref 6.1–8.1)

## 2019-11-25 LAB — CBC WITH DIFFERENTIAL/PLATELET
Absolute Monocytes: 714 cells/uL (ref 200–950)
Basophils Absolute: 18 cells/uL (ref 0–200)
Basophils Relative: 0.3 %
Eosinophils Absolute: 153 cells/uL (ref 15–500)
Eosinophils Relative: 2.5 %
HCT: 38.9 % (ref 35.0–45.0)
Hemoglobin: 12.7 g/dL (ref 11.7–15.5)
Lymphs Abs: 2379 cells/uL (ref 850–3900)
MCH: 29.5 pg (ref 27.0–33.0)
MCHC: 32.6 g/dL (ref 32.0–36.0)
MCV: 90.3 fL (ref 80.0–100.0)
MPV: 10.6 fL (ref 7.5–12.5)
Monocytes Relative: 11.7 %
Neutro Abs: 2837 cells/uL (ref 1500–7800)
Neutrophils Relative %: 46.5 %
Platelets: 328 10*3/uL (ref 140–400)
RBC: 4.31 10*6/uL (ref 3.80–5.10)
RDW: 13.9 % (ref 11.0–15.0)
Total Lymphocyte: 39 %
WBC: 6.1 10*3/uL (ref 3.8–10.8)

## 2019-11-26 ENCOUNTER — Telehealth: Payer: Self-pay | Admitting: Pharmacy Technician

## 2019-11-26 ENCOUNTER — Other Ambulatory Visit: Payer: Self-pay

## 2019-11-26 ENCOUNTER — Ambulatory Visit (INDEPENDENT_AMBULATORY_CARE_PROVIDER_SITE_OTHER): Payer: 59 | Admitting: *Deleted

## 2019-11-26 VITALS — BP 150/95 | HR 80

## 2019-11-26 DIAGNOSIS — M0579 Rheumatoid arthritis with rheumatoid factor of multiple sites without organ or systems involvement: Secondary | ICD-10-CM

## 2019-11-26 MED ORDER — CERTOLIZUMAB PEGOL 2 X 200 MG ~~LOC~~ KIT
400.0000 mg | PACK | Freq: Once | SUBCUTANEOUS | Status: AC
  Administered 2019-11-26: 400 mg via SUBCUTANEOUS

## 2019-11-26 NOTE — Progress Notes (Signed)
Patient in office for a Cimzia injection. Patient given injection in right and left lower abdomen. Patient tolerated injection well. Patient will return in one month for next injection.   Administrations This Visit    Certolizumab Pegol KIT 400 mg    Admin Date 11/26/2019 Action Given Dose 400 mg Route Subcutaneous Administered By Carole Binning, LPN

## 2019-11-26 NOTE — Progress Notes (Signed)
CBC and CMP are stable.

## 2019-11-26 NOTE — Telephone Encounter (Signed)
Received fax from [facility], stating Cimzia needs Pre-cert.  Submitted a Precertification request to [employer] for CIMZIA vials via Fax. Will update once we receive a response.  Faxed forms to # [phone]  Phone# [phone]  12:52 PM Beatriz Chancellor, CPhT

## 2019-11-27 NOTE — Telephone Encounter (Signed)
Called [employer] to obtain correct fax number for medical benefit pre-cert team- Fax# [phone]  Documents have been faxed.  9:10 AM Beatriz Chancellor, CPhT

## 2019-12-02 ENCOUNTER — Other Ambulatory Visit: Payer: Self-pay | Admitting: Internal Medicine

## 2019-12-02 NOTE — Telephone Encounter (Signed)
Received notification from [facility] regarding a precertification for [facility]. Authorization has been APPROVED from 11/27/2019 to 05/28/20.   Authorization # [id number]  1:40 PM Beatriz Chancellor, CPhT

## 2019-12-24 ENCOUNTER — Ambulatory Visit (INDEPENDENT_AMBULATORY_CARE_PROVIDER_SITE_OTHER): Payer: 59 | Admitting: Pharmacist

## 2019-12-24 ENCOUNTER — Other Ambulatory Visit: Payer: Self-pay

## 2019-12-24 VITALS — BP 153/101 | HR 79

## 2019-12-24 DIAGNOSIS — M0579 Rheumatoid arthritis with rheumatoid factor of multiple sites without organ or systems involvement: Secondary | ICD-10-CM | POA: Diagnosis not present

## 2019-12-24 DIAGNOSIS — Z79899 Other long term (current) drug therapy: Secondary | ICD-10-CM | POA: Diagnosis not present

## 2019-12-24 MED ORDER — CERTOLIZUMAB PEGOL 2 X 200 MG ~~LOC~~ KIT: PACK | Freq: Once | SUBCUTANEOUS | Status: AC

## 2019-12-24 NOTE — Progress Notes (Signed)
Pharmacy Note  Subjective:    Patient presents to receive her monthly in office Cimzia injection.  She denies signs and symptoms of infection.  She denies having any upcoming invasive procedures.  Patient is having increased right knee and right wrist pain.  She was given a cortisone injection on 06/12/2019 which she found helpful.  She is interested in discontinuing methotrexate as she has not noticed any improvement since starting.  She has a follow-up appointment with Hazel Sams on 01/08/2020 and will discuss symptoms and medication regimen at that time.  Discussed alternative medication options such as Enbrel and Humira if her symptoms worsen.  Objective: CMP     Component Value Date/Time   NA 137 11/25/2019 1009   NA 138 06/11/2018 1212   K 4.3 11/25/2019 1009   CL 102 11/25/2019 1009   CO2 28 11/25/2019 1009   GLUCOSE 95 11/25/2019 1009   BUN 16 11/25/2019 1009   BUN 9 06/11/2018 1212   CREATININE 0.96 11/25/2019 1009   CALCIUM 9.4 11/25/2019 1009   PROT 7.9 11/25/2019 1009   PROT 8.0 06/11/2018 1212   ALBUMIN 3.8 06/11/2018 1212   AST 12 11/25/2019 1009   ALT 9 11/25/2019 1009   ALKPHOS 69 06/11/2018 1212   BILITOT 0.5 11/25/2019 1009   BILITOT 0.3 06/11/2018 1212   GFRNONAA 72 11/25/2019 1009   GFRAA 83 11/25/2019 1009    CBC    Component Value Date/Time   WBC 6.1 11/25/2019 1009   RBC 4.31 11/25/2019 1009   HGB 12.7 11/25/2019 1009   HGB 11.9 06/11/2018 1212   HCT 38.9 11/25/2019 1009   HCT 36.6 06/11/2018 1212   PLT 328 11/25/2019 1009   PLT 310 06/11/2018 1212   MCV 90.3 11/25/2019 1009   MCV 93 06/11/2018 1212   MCH 29.5 11/25/2019 1009   MCHC 32.6 11/25/2019 1009   RDW 13.9 11/25/2019 1009   RDW 14.4 06/11/2018 1212   LYMPHSABS 2,379 11/25/2019 1009   LYMPHSABS 2.3 06/11/2018 1212   MONOABS 928 03/07/2017 1632   EOSABS 153 11/25/2019 1009   EOSABS 0.2 06/11/2018 1212   BASOSABS 18 11/25/2019 1009   BASOSABS 0.0 06/11/2018 1212    Baseline  Immunosuppressant Therapy Labs TB GOLD Quantiferon TB Gold Latest Ref Rng & Units 03/14/2019  Quantiferon TB Gold Plus NEGATIVE NEGATIVE   Hepatitis Panel Hepatitis Latest Ref Rng & Units 06/13/2019  Hep B Surface Ag NON-REACTI NON-REACTIVE  Hep B IgM NON-REACTI NON-REACTIVE  Hep C Ab NON-REACTI NON-REACTIVE  Hep C Ab NON-REACTI NON-REACTIVE  Hep A IgM NON-REACTI NON-REACTIVE   HIV Pending next labs  Immunoglobulins Pending next labs  SPEP Pending next labs  G6PD No results found for: G6PDH TPMT No results found for: TPMT   Chest x-ray: no acute abnormalities 12/12/2013  Assessment/Plan:   Injected in left and right anterior thigh. Patient tolerated well without issue.  Administrations This Visit    Certolizumab Pegol KIT    Admin Date 12/24/2019 Action Given Dose  Route Subcutaneous Administered By Deboraha Sprang, RPH-CPP         She will be due for labs in June 2021.  Scheduled next appointment for administration on 01/21/2020 at [facility].   All questions encouraged and answered.  Instructed patient to call with any further questions or concerns.   Mariella Saa, PharmD, [place], [place] Clinical Specialty Pharmacist [phone]  12/24/2019 9:55 AM

## 2019-12-27 ENCOUNTER — Other Ambulatory Visit: Payer: Self-pay | Admitting: Rheumatology

## 2019-12-29 NOTE — Telephone Encounter (Signed)
Last Visit: 10/08/19 Next Visit: 01/08/20 Labs: 11/25/19 stable   Current Dose per office note on 10/08/19: methotrexate 6 tablets every 7 days  Okay to refill per Dr. Estanislado Pandy

## 2019-12-31 NOTE — Progress Notes (Signed)
Office Visit Note  Patient: Jennifer [name]             Date of Birth: 14-Feb-1976           MRN: [id number]             PCP: Burnis Medin, MD Referring: Burnis Medin, MD Visit Date: 01/08/2020 Occupation: _0 @  Subjective:  Right knee joint pain   History of Present Illness: Jennifer [name] is a 44 y.o. female with history of seropositive rheumatoid arthritis and osteoarthritis.  She is on Cimzia 400 mg subcutaneous injections once monthly, MTX 6 tablets po once weekly, and folic acid 2 mg po daily.  She states she has been having increased pain and intermittent inflammation in the right wrist joint.  She continues to have chronic pain in the right knee and will be discussing a knee replacement with Dr. Wynelle Link next week.  She has been walking with a cane to assist with ambulation but she feels is contributing to some of her right wrist discomfort.  She denies any other joint pain or joint swelling at this time.   Activities of Daily Living:  Patient reports morning stiffness for 6 hour.   Patient Reports nocturnal pain.  Difficulty dressing/grooming: Denies Difficulty climbing stairs: Reports Difficulty getting out of chair: Denies Difficulty using hands for taps, buttons, cutlery, and/or writing: Reports  Review of Systems  Constitutional: Positive for fatigue.  HENT: Negative for mouth sores, mouth dryness and nose dryness.   Eyes: Negative for pain, visual disturbance and dryness.  Respiratory: Negative for cough, hemoptysis and difficulty breathing.   Cardiovascular: Negative for chest pain, palpitations, hypertension and swelling in legs/feet.  Gastrointestinal: Negative for blood in stool, constipation and diarrhea.  Endocrine: Negative for excessive thirst and increased urination.  Genitourinary: Negative for difficulty urinating and painful urination.  Musculoskeletal: Positive for arthralgias, gait problem, joint pain, joint swelling and morning stiffness.  Negative for myalgias, muscle weakness, muscle tenderness and myalgias.  Skin: Negative for color change, pallor, rash, hair loss, nodules/bumps, skin tightness, ulcers and sensitivity to sunlight.  Neurological: Negative for dizziness, numbness, headaches and weakness.  Hematological: Negative for swollen glands.  Psychiatric/Behavioral: Positive for sleep disturbance. Negative for depressed mood. The patient is not nervous/anxious.     PMFS History:  Patient Active Problem List   Diagnosis Date Noted  . Primary osteoarthritis of both hands 02/04/2018  . Primary osteoarthritis of both knees 05/22/2017  . Incisional hernia 12/18/2016  . Leiomyoma of uterus 09/06/2016  . Fibroid, uterine   . Essential hypertension 05/25/2015  . Recurrent headache 05/25/2015  . Head lump 07/31/2014  . Edema 12/29/2013  . Baker's cyst, ruptured 12/25/2013  . Cough, persistent 12/12/2013  . Left leg swelling 12/12/2013  . Cough 12/12/2013  . High risk medication use 12/12/2013  . Recurrent periodic urticaria   . Rheumatoid arthritis ([place]) 08/28/2012  . CHRONIC LARYNGITIS 11/03/2010  . ALLERGIC RHINITIS 11/03/2010  . PARESTHESIA 07/28/2010  . DYSMENORRHEA 10/15/2007  . FIBROIDS, UTERUS 07/24/2007  . OBESITY 07/24/2007  . SLEEPLESSNESS 07/24/2007  . HEADACHE 07/24/2007    Past Medical History:  Diagnosis Date  . Acute otitis media 08/28/2012   improved  change to liquid medication   . Anxiety   . Breast abscess of female    Recurrent  . Depression   . Family history of adverse reaction to anesthesia    father hard time waking once (12/20/2016)  . Fibroids    w/bleeding  .  GERD (gastroesophageal reflux disease)   . History of blood transfusion    after surgery  . Hypertension   . Migraines    Dr. Orie Rout; "I have a few/month" (12/20/2016)  . Osteoarthritis   . Pill esophagitis 08/28/2012   amoxicillin  by hx  disc plan nl voice except hoarse not drooling  close fu  if not getting  better with plan stop aleve  liquid ibu onlyf necessary   . Recurrent periodic urticaria   . Rheumatoid arthritis ([place])    "55% of my body" (12/20/2016)  . Sleep apnea    wears cpap    Family History  Problem Relation Age of Onset  . Hypertension Mother   . Rheum arthritis Mother   . Hypertension Father   . Sleep apnea Father    Past Surgical History:  Procedure Laterality Date  . BREAST SURGERY     abcess under left breast   . CYST EXCISION Left 2014   "elbow"  . HERNIA REPAIR    . INCISION AND DRAINAGE BREAST ABSCESS Left 2003  . INCISIONAL HERNIA REPAIR N/A 12/18/2016   Procedure: REPAIR INCISIONAL HERNIA WITH MESH;  Surgeon: Georganna Skeans, MD;  Location: [facility];  Service: General;  Laterality: N/A;  . INSERTION OF MESH N/A 12/18/2016   Procedure: INSERTION OF MESH;  Surgeon: Georganna Skeans, MD;  Location: [facility];  Service: General;  Laterality: N/A;  . KNEE ARTHROSCOPY WITH MENISCAL REPAIR Right 06/17/2018   Procedure: RIGHT KNEE ARTHROSCOPY WITH MENISCAL REPAIR;  Surgeon: Vickey Huger, MD;  Location: [facility];  Service: Orthopedics;  Laterality: Right;  . MYOMECTOMY N/A 09/06/2016   Procedure: ABOMINAL MYOMECTOMY;  Surgeon: Governor Specking, MD;  Location: [facility];  Service: Gynecology;  Laterality: N/A;  . UTERINE FIBROID SURGERY     34 fibroids   Social History   Social History Narrative  . Not on file   Immunization History  Administered Date(s) Administered  . Influenza Split 08/21/2012  . Influenza,inj,Quad PF,6+ Mos 07/31/2014, 08/19/2019  . Pneumococcal Conjugate-13 08/28/2012     Objective: Vital Signs: BP (!) 151/90 (BP Location: Right Arm, Patient Position: Sitting, Cuff Size: Normal)   Pulse 76   Resp 16   Ht _0  (1.626 m)   Wt 269 lb 3.2 oz (122.1 kg)   BMI 46.21 kg/m    Physical Exam Vitals and nursing note reviewed.  Constitutional:      Appearance: She is well-developed.  HENT:     Head: Normocephalic and atraumatic.  Eyes:      Conjunctiva/sclera: Conjunctivae normal.  Pulmonary:     Effort: Pulmonary effort is normal.  Abdominal:     General: Bowel sounds are normal.     Palpations: Abdomen is soft.  Musculoskeletal:     Cervical back: Normal range of motion.  Lymphadenopathy:     Cervical: No cervical adenopathy.  Skin:    General: Skin is warm and dry.     Capillary Refill: Capillary refill takes less than 2 seconds.  Neurological:     Mental Status: She is alert and oriented to person, place, and time.  Psychiatric:        Behavior: Behavior normal.      Musculoskeletal Exam: C-spine, thoracic spine, lumbar spine range of motion.  Shoulder joints, elbow joints, wrist joints, MCPs, PIPs and DIPs good range of motion with no synovitis.  She has tenderness of the right wrist over the ulnar styloid.  Hip joints difficult to assess in seated position.  Right knee has limited extension with warmth and swelling.  Left knee has good range of motion with no warmth or inflammation.  Ankle joints have good range of motion no tenderness or inflammation.  CDAI Exam: CDAI Score: 5.2  Patient Global: 6 mm; Provider Global: 6 mm Swollen: 2 ; Tender: 2  Joint Exam 01/08/2020      Right  Left  Wrist  Swollen Tender     Knee  Swollen Tender        Investigation: No additional findings.  Imaging: No results found.  Recent Labs: Lab Results  Component Value Date   WBC 6.1 11/25/2019   HGB 12.7 11/25/2019   PLT 328 11/25/2019   NA 137 11/25/2019   K 4.3 11/25/2019   CL 102 11/25/2019   CO2 28 11/25/2019   GLUCOSE 95 11/25/2019   BUN 16 11/25/2019   CREATININE 0.96 11/25/2019   BILITOT 0.5 11/25/2019   ALKPHOS 69 06/11/2018   AST 12 11/25/2019   ALT 9 11/25/2019   PROT 7.9 11/25/2019   ALBUMIN 3.8 06/11/2018   CALCIUM 9.4 11/25/2019   GFRAA 83 11/25/2019   QFTBGOLDPLUS NEGATIVE 03/14/2019    Speciality Comments: Prior therapy: Plaquenil (inadequate response)  Procedures:  No procedures  performed Allergies: Lisinopril-hydrochlorothiazide   Assessment / Plan:     Visit Diagnoses: Rheumatoid arthritis involving multiple sites with positive rheumatoid factor (HCC) -  +CCP, +HLA B27: She presents today with ongoing pain and stiffness in the right wrist joint.  She has tenderness over the ulnar styloid but no inflammation was noted on exam today.  She had a right wrist cortisone injection on 06/12/2019 which provided temporary relief.  She continues to have chronic pain and inflammation in the right knee joint and is planning to proceed with a right knee replacement.  She is not having any other joint pain or inflammation at this time.  She is on Cimzia 400 mg in office injections every 28 days, methotrexate 6 tablets by mouth once weekly, folic acid 2 mg by mouth daily.  We discussed increasing the dose of methotrexate from 6 tablets to 8 tablets by mouth once weekly.  She is in agreement.  She will be due to update lab work 2 weeks after increasing the dose of methotrexate.  We also discussed switching to injectable methotrexate 0.8 mL subcu injections once weekly.  Instructions on how to inject methotrexate will be provided at her upcoming [place] office visit.  She was advised to notify us if her joint pain and inflammation persists or worsens.  She will follow-up in the office in 3 months.  High risk medication use -  Cimzia 400 mg in office every 28 days, methotrexate 8 tablets every 7 days, and folic acid 1 mg 2 tablets daily.  Last TB gold negative on 03/14/2019 and will monitor yearly.  A future order for TB gold was placed today.  Standing orders for CBC and CMP are in place.  She will be increasing methotrexate from 6 tablets a tablets by mouth once weekly.  She was advised to have lab work 2 weeks after increasing the dose of methotrexate.  We will discuss switching her to injectable methotrexate at her upcoming Cimzia in office appointment.  At that visit she will be instructed how to  use injectable methotrexate.- Plan: QuantiFERON-TB Gold Plus  Primary osteoarthritis of both hands: She has no tenderness or inflammation exam.  She is complete fist formation bilaterally.  Joint protection and muscle strengthening were  discussed.  Primary osteoarthritis of both knees: She has chronic right knee joint pain.  She has limited extension with discomfort in the right knee.  Warmth and swelling in the right knee was noted.  She is following up with Dr. Wynelle Link to discuss proceeding with a right knee replacement.  Her left knee has good range of motion on exam with no discomfort.  No warmth or effusion was noted.  She uses a cane to assist with ambulation.  S/P right knee arthroscopy: Chronic pain  Essential hypertension  Orders: Orders Placed This Encounter  Procedures  . QuantiFERON-TB Gold Plus   No orders of the defined types were placed in this encounter.   Follow-Up Instructions: Return in about 3 months (around 04/08/2020) for Rheumatoid arthritis.   Ofilia Neas, PA-C  Note - This record has been created using Dragon software.  Chart creation errors have been sought, but may not always  have been located. Such creation errors do not reflect on  the standard of medical care.

## 2020-01-08 ENCOUNTER — Other Ambulatory Visit: Payer: Self-pay

## 2020-01-08 ENCOUNTER — Ambulatory Visit (INDEPENDENT_AMBULATORY_CARE_PROVIDER_SITE_OTHER): Payer: 59 | Admitting: Physician Assistant

## 2020-01-08 ENCOUNTER — Encounter: Payer: Self-pay | Admitting: Physician Assistant

## 2020-01-08 VITALS — BP 151/90 | HR 76 | Resp 16 | Ht 64.0 in | Wt 269.2 lb

## 2020-01-08 DIAGNOSIS — M19041 Primary osteoarthritis, right hand: Secondary | ICD-10-CM | POA: Diagnosis not present

## 2020-01-08 DIAGNOSIS — M0579 Rheumatoid arthritis with rheumatoid factor of multiple sites without organ or systems involvement: Secondary | ICD-10-CM | POA: Diagnosis not present

## 2020-01-08 DIAGNOSIS — M170 Bilateral primary osteoarthritis of knee: Secondary | ICD-10-CM | POA: Diagnosis not present

## 2020-01-08 DIAGNOSIS — I10 Essential (primary) hypertension: Secondary | ICD-10-CM

## 2020-01-08 DIAGNOSIS — Z79899 Other long term (current) drug therapy: Secondary | ICD-10-CM

## 2020-01-08 DIAGNOSIS — M19042 Primary osteoarthritis, left hand: Secondary | ICD-10-CM

## 2020-01-08 DIAGNOSIS — Z9889 Other specified postprocedural states: Secondary | ICD-10-CM

## 2020-01-08 NOTE — Patient Instructions (Signed)
Standing Labs We placed an order today for your standing lab work.    Please come back and get your standing labs in June and every 3 months   We have open lab daily Monday through Thursday from 8:30-12:30 PM and 1:30-4:30 PM and Friday from 8:30-12:30 PM and 1:30-4:00 PM at the office of Dr. Shaili Deveshwar.   You may experience shorter wait times on Monday and Friday afternoons. The office is located at [address], [address], [place], [place] [address] No appointment is necessary.   Labs are drawn by [facility].  You may receive a bill from [facility] for your lab work.  If you wish to have your labs drawn at another location, please call the office 24 hours in advance to send orders.  If you have any questions regarding directions or hours of operation,  please call [phone].   Just as a reminder please drink plenty of water prior to coming for your lab work. Thanks!   

## 2020-01-13 DIAGNOSIS — M25561 Pain in right knee: Secondary | ICD-10-CM | POA: Insufficient documentation

## 2020-01-20 NOTE — Progress Notes (Signed)
Pharmacy Note  Subjective:   Patient presents to clinic today to receive monthly inection of Cimzia.  Patient running a fever or have signs/symptoms of infection? No  Patient currently on antibiotics for the treatment of infection? No  Patient have any upcoming invasive procedures/surgeries? No  Objective: CMP     Component Value Date/Time   NA 137 11/25/2019 1009   NA 138 06/11/2018 1212   K 4.3 11/25/2019 1009   CL 102 11/25/2019 1009   CO2 28 11/25/2019 1009   GLUCOSE 95 11/25/2019 1009   BUN 16 11/25/2019 1009   BUN 9 06/11/2018 1212   CREATININE 0.96 11/25/2019 1009   CALCIUM 9.4 11/25/2019 1009   PROT 7.9 11/25/2019 1009   PROT 8.0 06/11/2018 1212   ALBUMIN 3.8 06/11/2018 1212   AST 12 11/25/2019 1009   ALT 9 11/25/2019 1009   ALKPHOS 69 06/11/2018 1212   BILITOT 0.5 11/25/2019 1009   BILITOT 0.3 06/11/2018 1212   GFRNONAA 72 11/25/2019 1009   GFRAA 83 11/25/2019 1009    CBC    Component Value Date/Time   WBC 6.1 11/25/2019 1009   RBC 4.31 11/25/2019 1009   HGB 12.7 11/25/2019 1009   HGB 11.9 06/11/2018 1212   HCT 38.9 11/25/2019 1009   HCT 36.6 06/11/2018 1212   PLT 328 11/25/2019 1009   PLT 310 06/11/2018 1212   MCV 90.3 11/25/2019 1009   MCV 93 06/11/2018 1212   MCH 29.5 11/25/2019 1009   MCHC 32.6 11/25/2019 1009   RDW 13.9 11/25/2019 1009   RDW 14.4 06/11/2018 1212   LYMPHSABS 2,379 11/25/2019 1009   LYMPHSABS 2.3 06/11/2018 1212   MONOABS 928 03/07/2017 1632   EOSABS 153 11/25/2019 1009   EOSABS 0.2 06/11/2018 1212   BASOSABS 18 11/25/2019 1009   BASOSABS 0.0 06/11/2018 1212    Baseline Immunosuppressant Therapy Labs TB GOLD Quantiferon TB Gold Latest Ref Rng & Units 03/14/2019  Quantiferon TB Gold Plus NEGATIVE NEGATIVE   Hepatitis Panel Hepatitis Latest Ref Rng & Units 06/13/2019  Hep B Surface Ag NON-REACTI NON-REACTIVE  Hep B IgM NON-REACTI NON-REACTIVE  Hep C Ab NON-REACTI NON-REACTIVE  Hep C Ab NON-REACTI NON-REACTIVE  Hep A  IgM NON-REACTI NON-REACTIVE   HIV No results found for: HIV   Chest x-ray: No acute abnormalities 12/12/2013  Assessment/Plan:   Administrations This Visit    Certolizumab Pegol KIT 400 mg    Admin Date 01/21/2020 Action Given Dose 400 mg Route Subcutaneous Administered By ,  C, RPH-CPP          Cimzia administered in both right and left anterior thigh. Patient tolerated well without adverse effects.  At last office visit provider discussed switching from oral methotrexate to injectable methotrexate.  Patient is amenable to vial and syringe but declined injection technique training today.  She is also interested in injectable methotrexate pen.  We will start the benefits investigation process and follow-up at next injection appointment next month.  Patient verbalized understanding.  At last office visit on 01/08/2020 methotrexate was increased from 6 tablets to 8 tablets.  Updated medication list to refect dose change.  Patient is due for repeat CBC/CMP today to monitor for MTX toxicity. She will also be due for TB gold in June. Patient states she is dehydrated and would not be able to obtain a good blood draw for lab work.  She will return later this week for labs.  Future orders placed.  Next injection scheduled for 02/18/20 at 9AM.    All questions encouraged and answered.  Instructed patient to call with any further questions or concerns.   , PharmD, BCACP Rheumatology Clinical Pharmacist  01/20/2020 11:26 AM     

## 2020-01-21 ENCOUNTER — Telehealth: Payer: Self-pay | Admitting: Pharmacist

## 2020-01-21 ENCOUNTER — Other Ambulatory Visit: Payer: Self-pay

## 2020-01-21 ENCOUNTER — Ambulatory Visit (INDEPENDENT_AMBULATORY_CARE_PROVIDER_SITE_OTHER): Payer: 59 | Admitting: Pharmacist

## 2020-01-21 VITALS — BP 124/84 | HR 75

## 2020-01-21 DIAGNOSIS — Z79899 Other long term (current) drug therapy: Secondary | ICD-10-CM

## 2020-01-21 DIAGNOSIS — M0579 Rheumatoid arthritis with rheumatoid factor of multiple sites without organ or systems involvement: Secondary | ICD-10-CM | POA: Diagnosis not present

## 2020-01-21 DIAGNOSIS — Z7689 Persons encountering health services in other specified circumstances: Secondary | ICD-10-CM | POA: Diagnosis not present

## 2020-01-21 MED ORDER — METHOTREXATE 2.5 MG PO TABS: 20.0000 mg | ORAL_TABLET | ORAL | 0 refills | Status: DC

## 2020-01-21 MED ORDER — CERTOLIZUMAB PEGOL 2 X 200 MG ~~LOC~~ KIT
400.0000 mg | PACK | Freq: Once | SUBCUTANEOUS | Status: AC
  Administered 2020-01-21: 400 mg via SUBCUTANEOUS

## 2020-01-21 NOTE — Telephone Encounter (Signed)
Plan prefers Rasuvo.  Ran test claim, no Prior Auth required. Patient has $30.00 copay for 1 month supply. Patient has commercial plan and is able to use a copay card.

## 2020-01-21 NOTE — Telephone Encounter (Signed)
Please start benefits investigation for Rasuvo/Otrexup.  Mariella Saa, PharmD, [place], [place] Clinical Specialty Pharmacist [phone]  01/21/2020 9:19 AM

## 2020-01-22 NOTE — Telephone Encounter (Signed)
Will discuss at patient's next appointment.     Mariella Saa, PharmD, [place], [place] Clinical Specialty Pharmacist [phone]  01/22/2020 10:31 AM

## 2020-01-30 ENCOUNTER — Other Ambulatory Visit: Payer: Self-pay | Admitting: Internal Medicine

## 2020-02-17 NOTE — Progress Notes (Signed)
Pharmacy Note  Subjective:   Patient presents to clinic today to receive monthly dose of Cimzia.  Patient running a fever or have signs/symptoms of infection? No  Patient currently on antibiotics for the treatment of infection? No  Patient have any upcoming invasive procedures/surgeries? No  Objective: CMP     Component Value Date/Time   NA 137 11/25/2019 1009   NA 138 06/11/2018 1212   K 4.3 11/25/2019 1009   CL 102 11/25/2019 1009   CO2 28 11/25/2019 1009   GLUCOSE 95 11/25/2019 1009   BUN 16 11/25/2019 1009   BUN 9 06/11/2018 1212   CREATININE 0.96 11/25/2019 1009   CALCIUM 9.4 11/25/2019 1009   PROT 7.9 11/25/2019 1009   PROT 8.0 06/11/2018 1212   ALBUMIN 3.8 06/11/2018 1212   AST 12 11/25/2019 1009   ALT 9 11/25/2019 1009   ALKPHOS 69 06/11/2018 1212   BILITOT 0.5 11/25/2019 1009   BILITOT 0.3 06/11/2018 1212   GFRNONAA 72 11/25/2019 1009   GFRAA 83 11/25/2019 1009    CBC    Component Value Date/Time   WBC 6.1 11/25/2019 1009   RBC 4.31 11/25/2019 1009   HGB 12.7 11/25/2019 1009   HGB 11.9 06/11/2018 1212   HCT 38.9 11/25/2019 1009   HCT 36.6 06/11/2018 1212   PLT 328 11/25/2019 1009   PLT 310 06/11/2018 1212   MCV 90.3 11/25/2019 1009   MCV 93 06/11/2018 1212   MCH 29.5 11/25/2019 1009   MCHC 32.6 11/25/2019 1009   RDW 13.9 11/25/2019 1009   RDW 14.4 06/11/2018 1212   LYMPHSABS 2,379 11/25/2019 1009   LYMPHSABS 2.3 06/11/2018 1212   MONOABS 928 03/07/2017 1632   EOSABS 153 11/25/2019 1009   EOSABS 0.2 06/11/2018 1212   BASOSABS 18 11/25/2019 1009   BASOSABS 0.0 06/11/2018 1212    Baseline Immunosuppressant Therapy Labs TB GOLD Quantiferon TB Gold Latest Ref Rng & Units 03/14/2019  Quantiferon TB Gold Plus NEGATIVE NEGATIVE   Hepatitis Panel Hepatitis Latest Ref Rng & Units 06/13/2019  Hep B Surface Ag NON-REACTI NON-REACTIVE  Hep B IgM NON-REACTI NON-REACTIVE  Hep C Ab NON-REACTI NON-REACTIVE  Hep C Ab NON-REACTI NON-REACTIVE  Hep A IgM  NON-REACTI NON-REACTIVE   HIV Pending 02/18/20  Immunoglobulins Pending 02/18/20  SPEP Pending 02/18/20  G6PD No results found for: G6PDH TPMT No results found for: TPMT   Chest x-ray: no acute abnormalities 12/12/2013  Assessment/Plan:   Administrations This Visit    Certolizumab Pegol KIT 400 mg    Admin Date 02/18/2020 Action Given Dose 400 mg Route Subcutaneous Administered By Mariella Saa C, RPH-CPP          Patient tolerated injection without issue.   Appointment for next injection scheduled for 6/30 at [facility].  Patient due for labs today.  Patient is to call and reschedule appointment if running a fever with signs/symptoms of infection, on antibiotics for active infection or has an upcoming invasive procedure.  All questions encouraged and answered.  Instructed patient to call with any further questions or concerns.  Mariella Saa, PharmD, [facility] Rheumatology Clinical Pharmacist  02/17/2020 10:27 AM

## 2020-02-18 ENCOUNTER — Ambulatory Visit (INDEPENDENT_AMBULATORY_CARE_PROVIDER_SITE_OTHER): Payer: 59 | Admitting: Pharmacist

## 2020-02-18 ENCOUNTER — Other Ambulatory Visit: Payer: Self-pay

## 2020-02-18 VITALS — BP 141/95 | HR 79

## 2020-02-18 DIAGNOSIS — Z79899 Other long term (current) drug therapy: Secondary | ICD-10-CM

## 2020-02-18 DIAGNOSIS — M0579 Rheumatoid arthritis with rheumatoid factor of multiple sites without organ or systems involvement: Secondary | ICD-10-CM | POA: Diagnosis not present

## 2020-02-18 MED ORDER — RASUVO 20 MG/0.4ML ~~LOC~~ SOAJ: 20.0000 mg | SUBCUTANEOUS | 0 refills | Status: DC

## 2020-02-18 MED ORDER — CERTOLIZUMAB PEGOL 2 X 200 MG ~~LOC~~ KIT
400.0000 mg | PACK | Freq: Once | SUBCUTANEOUS | Status: AC
  Administered 2020-02-18: 400 mg via SUBCUTANEOUS

## 2020-02-18 MED FILL — RASUVO 20 MG/0.4 ML AUTOINJ: 20 | 84 days supply | Qty: 5 | Fill #0

## 2020-02-20 LAB — PROTEIN ELECTROPHORESIS, SERUM, WITH REFLEX
Albumin ELP: 3.8 g/dL (ref 3.8–4.8)
Alpha 1: 0.3 g/dL (ref 0.2–0.3)
Alpha 2: 0.7 g/dL (ref 0.5–0.9)
Beta 2: 0.7 g/dL — ABNORMAL HIGH (ref 0.2–0.5)
Beta Globulin: 0.5 g/dL (ref 0.4–0.6)
Gamma Globulin: 2.1 g/dL — ABNORMAL HIGH (ref 0.8–1.7)
Total Protein: 8 g/dL (ref 6.1–8.1)

## 2020-02-20 LAB — COMPLETE METABOLIC PANEL WITH GFR
AG Ratio: 1 (calc) (ref 1.0–2.5)
ALT: 11 U/L (ref 6–29)
AST: 15 U/L (ref 10–30)
Albumin: 3.9 g/dL (ref 3.6–5.1)
Alkaline phosphatase (APISO): 78 U/L (ref 31–125)
BUN: 14 mg/dL (ref 7–25)
CO2: 28 mmol/L (ref 20–32)
Calcium: 9.3 mg/dL (ref 8.6–10.2)
Chloride: 102 mmol/L (ref 98–110)
Creat: 0.99 mg/dL (ref 0.50–1.10)
GFR, Est African American: 80 mL/min/{1.73_m2} (ref >=60)
GFR, Est Non African American: 69 mL/min/{1.73_m2} (ref >=60)
Globulin: 3.9 g/dL (calc) — ABNORMAL HIGH (ref 1.9–3.7)
Glucose, Bld: 83 mg/dL (ref 65–99)
Potassium: 3.8 mmol/L (ref 3.5–5.3)
Sodium: 138 mmol/L (ref 135–146)
Total Bilirubin: 0.6 mg/dL (ref 0.2–1.2)
Total Protein: 7.8 g/dL (ref 6.1–8.1)

## 2020-02-20 LAB — CBC WITH DIFFERENTIAL/PLATELET
Absolute Monocytes: 650 cells/uL (ref 200–950)
Basophils Absolute: 29 cells/uL (ref 0–200)
Basophils Relative: 0.5 %
Eosinophils Absolute: 68 cells/uL (ref 15–500)
Eosinophils Relative: 1.2 %
HCT: 38.3 % (ref 35.0–45.0)
Hemoglobin: 12.5 g/dL (ref 11.7–15.5)
Lymphs Abs: 2936 cells/uL (ref 850–3900)
MCH: 29.8 pg (ref 27.0–33.0)
MCHC: 32.6 g/dL (ref 32.0–36.0)
MCV: 91.2 fL (ref 80.0–100.0)
MPV: 10.2 fL (ref 7.5–12.5)
Monocytes Relative: 11.4 %
Neutro Abs: 2018 cells/uL (ref 1500–7800)
Neutrophils Relative %: 35.4 %
Platelets: 367 10*3/uL (ref 140–400)
RBC: 4.2 10*6/uL (ref 3.80–5.10)
RDW: 13.1 % (ref 11.0–15.0)
Total Lymphocyte: 51.5 %
WBC: 5.7 10*3/uL (ref 3.8–10.8)

## 2020-02-20 LAB — QUANTIFERON-TB GOLD PLUS
Mitogen-NIL: >10 IU/mL
NIL: 0.03 IU/mL
QuantiFERON-TB Gold Plus: NEGATIVE
TB1-NIL: 0.02 IU/mL
TB2-NIL: 0.01 IU/mL

## 2020-02-20 LAB — HIV ANTIBODY (ROUTINE TESTING W REFLEX): HIV 1&2 Ab, 4th Generation: NONREACTIVE

## 2020-02-20 LAB — IGG, IGA, IGM
IgG (Immunoglobin G), Serum: 2218 mg/dL — ABNORMAL HIGH (ref 600–1640)
IgM, Serum: 78 mg/dL (ref 50–300)
Immunoglobulin A: 651 mg/dL — ABNORMAL HIGH (ref 47–310)

## 2020-02-20 NOTE — Progress Notes (Signed)
TB gold negative

## 2020-02-25 NOTE — Progress Notes (Signed)
CMP stable and CBC WNL.  No SPEP or IG on file and recently updated. IgA and IgG elevated. Beta 2 and gamma globulin elevated.  She is on MTX and in office Cimzia.

## 2020-02-25 NOTE — Progress Notes (Signed)
Labs are stable.  No change in therapy required.

## 2020-03-12 NOTE — Progress Notes (Deleted)
Pharmacy Note  Subjective:   Patient presents to clinic today to receive monthly dose of Cimzia.  Patient running a fever or have signs/symptoms of infection? {yes/no:20286}  Patient currently on antibiotics for the treatment of infection? {yes/no:20286}  Patient have any upcoming invasive procedures/surgeries? {yes/no:20286}  Objective: CMP     Component Value Date/Time   NA 138 02/18/2020 0904   NA 138 06/11/2018 1212   K 3.8 02/18/2020 0904   CL 102 02/18/2020 0904   CO2 28 02/18/2020 0904   GLUCOSE 83 02/18/2020 0904   BUN 14 02/18/2020 0904   BUN 9 06/11/2018 1212   CREATININE 0.99 02/18/2020 0904   CALCIUM 9.3 02/18/2020 0904   PROT 7.8 02/18/2020 0904   PROT 8.0 02/18/2020 0904   PROT 8.0 06/11/2018 1212   ALBUMIN 3.8 06/11/2018 1212   AST 15 02/18/2020 0904   ALT 11 02/18/2020 0904   ALKPHOS 69 06/11/2018 1212   BILITOT 0.6 02/18/2020 0904   BILITOT 0.3 06/11/2018 1212   GFRNONAA 69 02/18/2020 0904   GFRAA 80 02/18/2020 0904    CBC    Component Value Date/Time   WBC 5.7 02/18/2020 0904   RBC 4.20 02/18/2020 0904   HGB 12.5 02/18/2020 0904   HGB 11.9 06/11/2018 1212   HCT 38.3 02/18/2020 0904   HCT 36.6 06/11/2018 1212   PLT 367 02/18/2020 0904   PLT 310 06/11/2018 1212   MCV 91.2 02/18/2020 0904   MCV 93 06/11/2018 1212   MCH 29.8 02/18/2020 0904   MCHC 32.6 02/18/2020 0904   RDW 13.1 02/18/2020 0904   RDW 14.4 06/11/2018 1212   LYMPHSABS 2,936 02/18/2020 0904   LYMPHSABS 2.3 06/11/2018 1212   MONOABS 928 03/07/2017 1632   EOSABS 68 02/18/2020 0904   EOSABS 0.2 06/11/2018 1212   BASOSABS 29 02/18/2020 0904   BASOSABS 0.0 06/11/2018 1212    Baseline Immunosuppressant Therapy Labs TB GOLD Quantiferon TB Gold Latest Ref Rng & Units 02/18/2020  Quantiferon TB Gold Plus NEGATIVE NEGATIVE   Hepatitis Panel Hepatitis Latest Ref Rng & Units 06/13/2019  Hep B Surface Ag NON-REACTI NON-REACTIVE  Hep B IgM NON-REACTI NON-REACTIVE  Hep C Ab NON-REACTI  NON-REACTIVE  Hep C Ab NON-REACTI NON-REACTIVE  Hep A IgM NON-REACTI NON-REACTIVE   HIV Lab Results  Component Value Date   HIV NON-REACTIVE 02/18/2020   Immunoglobulins Immunoglobulin Electrophoresis Latest Ref Rng & Units 02/18/2020  IgA  47 - 310 mg/dL 651(H)  IgG 600 - 1,640 mg/dL 2,218(H)  IgM 50 - 300 mg/dL 78   SPEP Serum Protein Electrophoresis Latest Ref Rng & Units 02/18/2020  Total Protein 6.1 - 8.1 g/dL 8.0  Albumin 3.8 - 4.8 g/dL 3.8  Alpha-1 0.2 - 0.3 g/dL 0.3  Alpha-2 0.5 - 0.9 g/dL 0.7  Beta Globulin 0.4 - 0.6 g/dL 0.5  Beta 2 0.2 - 0.5 g/dL 0.7(H)  Gamma Globulin 0.8 - 1.7 g/dL 2.1(H)   G6PD No results found for: G6PDH TPMT No results found for: TPMT   Chest x-ray: no acute abnormalities 12/12/2013  Assessment/Plan:   Patient tolerated injection  ***.   Appointment for next injection scheduled for ***.  Patient due for labs in September.  Patient is to call and reschedule appointment if running a fever with signs/symptoms of infection, on antibiotics for active infection or has an upcoming invasive procedure.  All questions encouraged and answered.  Instructed patient to call with any further questions or concerns.

## 2020-03-17 ENCOUNTER — Ambulatory Visit: Payer: 59

## 2020-03-18 ENCOUNTER — Ambulatory Visit (INDEPENDENT_AMBULATORY_CARE_PROVIDER_SITE_OTHER): Payer: 59 | Admitting: Pharmacist

## 2020-03-18 ENCOUNTER — Other Ambulatory Visit: Payer: Self-pay

## 2020-03-18 DIAGNOSIS — M0579 Rheumatoid arthritis with rheumatoid factor of multiple sites without organ or systems involvement: Secondary | ICD-10-CM

## 2020-03-18 MED ORDER — CERTOLIZUMAB PEGOL 2 X 200 MG ~~LOC~~ KIT
400.0000 mg | PACK | Freq: Once | SUBCUTANEOUS | Status: AC
  Administered 2020-03-18: 400 mg via SUBCUTANEOUS

## 2020-03-18 NOTE — Progress Notes (Signed)
Pharmacy Note  Subjective:   Patient presents to clinic today to receive monthly dose of Cimzia.  Patient running a fever or have signs/symptoms of infection? No  Patient currently on antibiotics for the treatment of infection? No  Patient have any upcoming invasive procedures/surgeries? No  Objective: CMP     Component Value Date/Time   NA 138 02/18/2020 0904   NA 138 06/11/2018 1212   K 3.8 02/18/2020 0904   CL 102 02/18/2020 0904   CO2 28 02/18/2020 0904   GLUCOSE 83 02/18/2020 0904   BUN 14 02/18/2020 0904   BUN 9 06/11/2018 1212   CREATININE 0.99 02/18/2020 0904   CALCIUM 9.3 02/18/2020 0904   PROT 7.8 02/18/2020 0904   PROT 8.0 02/18/2020 0904   PROT 8.0 06/11/2018 1212   ALBUMIN 3.8 06/11/2018 1212   AST 15 02/18/2020 0904   ALT 11 02/18/2020 0904   ALKPHOS 69 06/11/2018 1212   BILITOT 0.6 02/18/2020 0904   BILITOT 0.3 06/11/2018 1212   GFRNONAA 69 02/18/2020 0904   GFRAA 80 02/18/2020 0904    CBC    Component Value Date/Time   WBC 5.7 02/18/2020 0904   RBC 4.20 02/18/2020 0904   HGB 12.5 02/18/2020 0904   HGB 11.9 06/11/2018 1212   HCT 38.3 02/18/2020 0904   HCT 36.6 06/11/2018 1212   PLT 367 02/18/2020 0904   PLT 310 06/11/2018 1212   MCV 91.2 02/18/2020 0904   MCV 93 06/11/2018 1212   MCH 29.8 02/18/2020 0904   MCHC 32.6 02/18/2020 0904   RDW 13.1 02/18/2020 0904   RDW 14.4 06/11/2018 1212   LYMPHSABS 2,936 02/18/2020 0904   LYMPHSABS 2.3 06/11/2018 1212   MONOABS 928 03/07/2017 1632   EOSABS 68 02/18/2020 0904   EOSABS 0.2 06/11/2018 1212   BASOSABS 29 02/18/2020 0904   BASOSABS 0.0 06/11/2018 1212    Baseline Immunosuppressant Therapy Labs TB GOLD Quantiferon TB Gold Latest Ref Rng & Units 02/18/2020  Quantiferon TB Gold Plus NEGATIVE NEGATIVE   Hepatitis Panel Hepatitis Latest Ref Rng & Units 06/13/2019  Hep B Surface Ag NON-REACTI NON-REACTIVE  Hep B IgM NON-REACTI NON-REACTIVE  Hep C Ab NON-REACTI NON-REACTIVE  Hep C Ab NON-REACTI  NON-REACTIVE  Hep A IgM NON-REACTI NON-REACTIVE   HIV Lab Results  Component Value Date   HIV NON-REACTIVE 02/18/2020   Immunoglobulins Immunoglobulin Electrophoresis Latest Ref Rng & Units 02/18/2020  IgA  47 - 310 mg/dL 651(H)  IgG 600 - 1,640 mg/dL 2,218(H)  IgM 50 - 300 mg/dL 78   SPEP Serum Protein Electrophoresis Latest Ref Rng & Units 02/18/2020  Total Protein 6.1 - 8.1 g/dL 8.0  Albumin 3.8 - 4.8 g/dL 3.8  Alpha-1 0.2 - 0.3 g/dL 0.3  Alpha-2 0.5 - 0.9 g/dL 0.7  Beta Globulin 0.4 - 0.6 g/dL 0.5  Beta 2 0.2 - 0.5 g/dL 0.7(H)  Gamma Globulin 0.8 - 1.7 g/dL 2.1(H)   G6PD No results found for: G6PDH TPMT No results found for: TPMT   Chest x-ray: no acute abnormalities 11/22/2013  Assessment/Plan:   Administrations This Visit    Certolizumab Pegol KIT 400 mg    Admin Date 03/18/2020 Action Given Dose 400 mg Route Subcutaneous Administered By Deboraha Sprang, RPH-CPP          Patient tolerated injection without issue.   Appointment for next injection scheduled for 7/29 @ 9AM.  Patient due for labs in September.  Patient is to call and reschedule appointment if running a fever with  signs/symptoms of infection, on antibiotics for active infection or has an upcoming invasive procedure.  We discussed starting injectable methotrexate in the past.  At last visit patient agreed to proceed with injectable methotrexate.  Prescription was sent for Rasuvo 20 mg and she received shipment without issue.  Patient would like training today on Rasuvo device.  She typically takes methotrexate on Fridays but skipped last Friday in order to inject in office today.  Patient would like to continue injecting on Fridays the future.  She will administer her next dose of methotrexate on 7/9.  Demonstrated proper injection technique with Rasuvo demo pen.  Patient able to demonstrate proper injection technique using the teach back method. Patient self injected in the left anterior thigh  with:  Sample Medication: Rasuvo 20 mg Expiration: 05/2021   Patient also requested prednisone taper as she started water aerobics and has increased joint pain after.  Dr. Estanislado Pandy examined patient and no synovitis noted.  Patient instructed to use Tylenol as needed.  All questions encouraged and answered.  Instructed patient to call with any further questions or concerns.   Mariella Saa, PharmD, [place], CPP Clinical Specialty Pharmacist (Rheumatology and Pulmonology)  03/18/2020 9:55 AM

## 2020-03-19 NOTE — Progress Notes (Deleted)
NEUROLOGY FOLLOW UP OFFICE NOTE  Jennifer [name] [id number]  HISTORY OF PRESENT ILLNESS: Jennifer [name] is a 44 year old female with rheumatoid arthritis who follows up for migraines.  UPDATE: Intensity:  *** Duration:  *** Frequency:  *** Frequency of abortive medication: *** Current NSAIDS:  Naproxen 54m Current analgesics:  Excedrin Migraine Current triptans:  Tosymra Current ergotamine:  none Current anti-emetic:  none Current muscle relaxants:  Flexeril Current anti-anxiolytic:  none Current sleep aide:  Melatonin 187mPRN Current Antihypertensive medications:  amlodipine Current Antidepressant medications:  none Current Anticonvulsant medications:  none Current anti-CGRP:  none Current Vitamins/Herbal/Supplements:  Folic acid; melatonin 1008MVRN Current Antihistamines/Decongestants:  none Other therapy:  none Hormone/birth control:  Kyleena Other medications:  methotrexate  Caffeine:  1 to 2 cups of coffee daily.  No soda or tea Diet:  No soda.  24 to 48 oz of water daily.  Currently on [employer] diet to lose weight for knee surgery.   Exercise:  Sit downs on Saturday and Sunday, limited due to needing knee surgery Depression:  no; Anxiety:  Yes (family-related Other pain:  Knee pain Sleep hygiene:  Poor.  She was diagnosed with OSA and uses a CPAP.  However, she can't turn off her mind.  4 to 6 hours of sleep a night.  HISTORY: She started having migraines in her late-20s to early-30s.  They are severe mid-frontal/bitemporal/back of neck throbbing pain.  They are associated with seeing white spots when severe, photophobia, phonophobia, rarely nausea..  Her migraines typically last 2 hours to 2 days and occur 2 to 3 times a month.  Triggers include stress and fluorescent lights.    She developed a migraine on 09/18/2019, however it was persistent.  She was subsequently diagnosed with Covid a few days later.  The migraine did not break.  She was prescribed a  prednisone taper on 10/08/2019 and the migraine broke.  She currently reports increased family-related stress.     Past NSAIDS/ steroids:  Ibuprofen 80080mprednisone Past analgesics:  Tramadol Past abortive triptans: Maxalt MLT 71m23mumatriptan 20mg31m eletriptan Past abortive ergotamine:  none Past muscle relaxants:  Robaxin Past anti-emetic:  none Past antihypertensive medications:  Labetolol; lisinopril-HCTZ Past antidepressant medications:  Doxepin 71mg 104m(hives); fluoxetine 71mg P33manticonvulsant medications:  zonisamide 100mg da30m topiramate (stopped due to potential memory problems) Past anti-CGRP:  none Past vitamins/Herbal/Supplements:  none Past antihistamines/decongestants:  none Other past therapies:  none   Family history of headache:  Aunt (used to have migraines)  PAST MEDICAL HISTORY: Past Medical History:  Diagnosis Date  . Acute otitis media 08/28/2012   improved  change to liquid medication   . Anxiety   . Breast abscess of female    Recurrent  . Depression   . Family history of adverse reaction to anesthesia    father hard time waking once (12/20/2016)  . Fibroids    w/bleeding  . GERD (gastroesophageal reflux disease)   . History of blood transfusion    after surgery  . Hypertension   . Migraines    Dr. MarshallOrie Route a few/month" (12/20/2016)  . Osteoarthritis   . Pill esophagitis 08/28/2012   amoxicillin  by hx  disc plan nl voice except hoarse not drooling  close fu  if not getting better with plan stop aleve  liquid ibu onlyf necessary   . Recurrent periodic urticaria   . Rheumatoid arthritis (HCC)    [place]% of my body" (12/20/2016)  .  Sleep apnea    wears cpap    MEDICATIONS: Current Outpatient Medications on File Prior to Visit  Medication Sig Dispense Refill  . albuterol (VENTOLIN HFA) 108 (90 Base) MCG/ACT inhaler TAKE 2 PUFFS BY MOUTH EVERY 6 HOURS AS NEEDED FOR WHEEZE OR SHORTNESS OF BREATH 18 g 1  . amLODipine (NORVASC)  5 MG tablet TAKE 1 TABLET BY MOUTH EVERY DAY 90 tablet 1  . benzonatate (TESSALON PERLES) 100 MG capsule Take 1 capsule (100 mg total) by mouth 3 (three) times daily as needed. (Patient not taking: Reported on 01/08/2020) 20 capsule 0  . CIMZIA 2 X 200 MG KIT INJECT 400 MG UNDER THE SKIN EVERY 28 (TWENTY-EIGHT) DAYS 1 each 2  . clindamycin-benzoyl peroxide (BENZACLIN) gel Apply 1 application topically every other day.     . cyclobenzaprine (FLEXERIL) 10 MG tablet TAKE 1 TABLET BY MOUTH EVERYDAY AT BEDTIME 20 tablet 0  . diclofenac sodium (VOLTAREN) 1 % GEL APPLY 4 G TOPICALLY 2 (TWO) TIMES DAILY. 400 g 3  . doxycycline (VIBRA-TABS) 100 MG tablet Take 1 tablet (100 mg total) by mouth 2 (two) times daily. (Patient not taking: Reported on 01/08/2020) 14 tablet 0  . ELDERBERRY PO Take by mouth daily.    [name] eletriptan (RELPAX) 40 MG tablet Take 1 tablet (40 mg total) by mouth as needed for migraine or headache. May repeat in 2 hours if headache persists or recurs. 10 tablet 1  . fluocinonide ointment (LIDEX) 3.70 % Apply 1 application topically 2 (two) times daily.   5  . folic acid (FOLVITE) 1 MG tablet Take 2 tablets (2 mg total) by mouth daily. 180 tablet 2  . Levonorgestrel (KYLEENA) 19.5 MG IUD Kyleena 17.5 mcg/24 hrs (63yr) 19.514mintrauterine device  Take 1 device by intrauterine route.    . Melatonin 10 MG CAPS Take 20 mg by mouth at bedtime as needed (for sleep).    . methocarbamol (ROBAXIN) 500 MG tablet Take 500 mg by mouth 2 (two) times daily between meals as needed for muscle spasms.    . Methotrexate, PF, (RASUVO) 20 MG/0.4ML SOAJ Inject 20 mg into the skin once a week. 12 pen 0  . naproxen (NAPROSYN) 500 MG tablet as needed.     . Marland KitchenVER THE COUNTER MEDICATION 3 capsules daily.    . rizatriptan (MAXALT-MLT) 10 MG disintegrating tablet Take 1 tablet (10 mg total) by mouth as needed for migraine. May repeat in 2 hours if needed (Patient not taking: Reported on 01/08/2020) 3 tablet 0  .  SUMAtriptan (TOSYMRA) 10 MG/ACT SOLN Place 1 spray into the nose every hour as needed (Maximum 3 sprays in 24 hours.). 6 each 11   No current facility-administered medications on file prior to visit.    ALLERGIES: Allergies  Allergen Reactions  . Lisinopril-Hydrochlorothiazide Swelling and Other (See Comments)    Angioedema  Has tolerated maxide in past so most likely  The ACE inhibitor as the cause     FAMILY HISTORY: Family History  Problem Relation Age of Onset  . Hypertension Mother   . Rheum arthritis Mother   . Hypertension Father   . Sleep apnea Father     SOCIAL HISTORY: Social History   Socioeconomic History  . Marital status: Significant Other    Spouse name: Not on file  . Number of children: Not on file  . Years of education: Not on file  . Highest education level: Not on file  Occupational History  . Not on file  Tobacco Use  . Smoking status: Never Smoker  . Smokeless tobacco: Never Used  Vaping Use  . Vaping Use: Never used  Substance and Sexual Activity  . Alcohol use: Yes    Comment: social  . Drug use: Never  . Sexual activity: Not Currently    Birth control/protection: None  Other Topics Concern  . Not on file  Social History Narrative  . Not on file   Social Determinants of Health   Financial Resource Strain:   . Difficulty of Paying Living Expenses:   Food Insecurity:   . Worried About Charity fundraiser in the Last Year:   . Arboriculturist in the Last Year:   Transportation Needs:   . Film/video editor (Medical):   [name] Lack of Transportation (Non-Medical):   Physical Activity:   . Days of Exercise per Week:   . Minutes of Exercise per Session:   Stress:   . Feeling of Stress :   Social Connections:   . Frequency of Communication with Friends and Family:   . Frequency of Social Gatherings with Friends and Family:   . Attends Religious Services:   . Active Member of Clubs or Organizations:   . Attends Archivist  Meetings:   [name] Marital Status:   Intimate Partner Violence:   . Fear of Current or Ex-Partner:   . Emotionally Abused:   [name] Physically Abused:   . Sexually Abused:     PHYSICAL EXAM: *** General: No acute distress.  Patient appears well-groomed.   Head:  Normocephalic/atraumatic Eyes:  Fundi examined but not visualized Neck: supple, no paraspinal tenderness, full range of motion Heart:  Regular rate and rhythm Lungs:  Clear to auscultation bilaterally Back: No paraspinal tenderness Neurological Exam: alert and oriented to person, place, and time. Attention span and concentration intact, recent and remote memory intact, fund of knowledge intact.  Speech fluent and not dysarthric, language intact.  CN II-XII intact. Bulk and tone normal, muscle strength 5/5 throughout.  Sensation to light touch, temperature and vibration intact.  Deep tendon reflexes 2+ throughout, toes downgoing.  Finger to nose and heel to shin testing intact.  Gait normal, Romberg negative.  IMPRESSION: Migraine without aura, without status migrainosus, not intractable  PLAN: 1.  For preventative management, *** 2.  For abortive therapy, *** 3.  Limit use of pain relievers to no more than 2 days out of week to prevent risk of rebound or medication-overuse headache. 4.  Keep headache diary 5.  Exercise, hydration, caffeine cessation, sleep hygiene, monitor for and avoid triggers 6. Follow up ***   Metta Clines, DO  CC: Shanon Ace, MD

## 2020-03-23 ENCOUNTER — Ambulatory Visit: Payer: 59 | Admitting: Neurology

## 2020-03-29 ENCOUNTER — Other Ambulatory Visit: Payer: Self-pay | Admitting: Rheumatology

## 2020-04-02 DIAGNOSIS — M1710 Unilateral primary osteoarthritis, unspecified knee: Secondary | ICD-10-CM | POA: Insufficient documentation

## 2020-04-02 DIAGNOSIS — M179 Osteoarthritis of knee, unspecified: Secondary | ICD-10-CM | POA: Insufficient documentation

## 2020-04-11 ENCOUNTER — Other Ambulatory Visit: Payer: Self-pay | Admitting: Internal Medicine

## 2020-04-15 ENCOUNTER — Other Ambulatory Visit: Payer: Self-pay

## 2020-04-15 ENCOUNTER — Ambulatory Visit (INDEPENDENT_AMBULATORY_CARE_PROVIDER_SITE_OTHER): Payer: 59 | Admitting: Pharmacist

## 2020-04-15 ENCOUNTER — Telehealth: Payer: Self-pay | Admitting: Rheumatology

## 2020-04-15 VITALS — BP 139/82 | HR 65

## 2020-04-15 DIAGNOSIS — M0579 Rheumatoid arthritis with rheumatoid factor of multiple sites without organ or systems involvement: Secondary | ICD-10-CM

## 2020-04-15 MED ORDER — CERTOLIZUMAB PEGOL 2 X 200 MG ~~LOC~~ KIT
400.0000 mg | PACK | Freq: Once | SUBCUTANEOUS | Status: AC
  Administered 2020-04-15: 400 mg via SUBCUTANEOUS

## 2020-04-15 NOTE — Progress Notes (Signed)
Pharmacy Note  Subjective:   Patient presents to clinic today to receive monthly dose of Cimzia.  Patient running a fever or have signs/symptoms of infection? No  Patient currently on antibiotics for the treatment of infection? No  Patient have any upcoming invasive procedures/surgeries? No  Objective: CMP     Component Value Date/Time   NA 138 02/18/2020 0904   NA 138 06/11/2018 1212   K 3.8 02/18/2020 0904   CL 102 02/18/2020 0904   CO2 28 02/18/2020 0904   GLUCOSE 83 02/18/2020 0904   BUN 14 02/18/2020 0904   BUN 9 06/11/2018 1212   CREATININE 0.99 02/18/2020 0904   CALCIUM 9.3 02/18/2020 0904   PROT 7.8 02/18/2020 0904   PROT 8.0 02/18/2020 0904   PROT 8.0 06/11/2018 1212   ALBUMIN 3.8 06/11/2018 1212   AST 15 02/18/2020 0904   ALT 11 02/18/2020 0904   ALKPHOS 69 06/11/2018 1212   BILITOT 0.6 02/18/2020 0904   BILITOT 0.3 06/11/2018 1212   GFRNONAA 69 02/18/2020 0904   GFRAA 80 02/18/2020 0904    CBC    Component Value Date/Time   WBC 5.7 02/18/2020 0904   RBC 4.20 02/18/2020 0904   HGB 12.5 02/18/2020 0904   HGB 11.9 06/11/2018 1212   HCT 38.3 02/18/2020 0904   HCT 36.6 06/11/2018 1212   PLT 367 02/18/2020 0904   PLT 310 06/11/2018 1212   MCV 91.2 02/18/2020 0904   MCV 93 06/11/2018 1212   MCH 29.8 02/18/2020 0904   MCHC 32.6 02/18/2020 0904   RDW 13.1 02/18/2020 0904   RDW 14.4 06/11/2018 1212   LYMPHSABS 2,936 02/18/2020 0904   LYMPHSABS 2.3 06/11/2018 1212   MONOABS 928 03/07/2017 1632   EOSABS 68 02/18/2020 0904   EOSABS 0.2 06/11/2018 1212   BASOSABS 29 02/18/2020 0904   BASOSABS 0.0 06/11/2018 1212    Baseline Immunosuppressant Therapy Labs TB GOLD Quantiferon TB Gold Latest Ref Rng & Units 02/18/2020  Quantiferon TB Gold Plus NEGATIVE NEGATIVE   Hepatitis Panel Hepatitis Latest Ref Rng & Units 06/13/2019  Hep B Surface Ag NON-REACTI NON-REACTIVE  Hep B IgM NON-REACTI NON-REACTIVE  Hep C Ab NON-REACTI NON-REACTIVE  Hep C Ab NON-REACTI  NON-REACTIVE  Hep A IgM NON-REACTI NON-REACTIVE   HIV Lab Results  Component Value Date   HIV NON-REACTIVE 02/18/2020   Immunoglobulins Immunoglobulin Electrophoresis Latest Ref Rng & Units 02/18/2020  IgA  47 - 310 mg/dL 651(H)  IgG 600 - 1,640 mg/dL 2,218(H)  IgM 50 - 300 mg/dL 78   SPEP Serum Protein Electrophoresis Latest Ref Rng & Units 02/18/2020  Total Protein 6.1 - 8.1 g/dL 8.0  Albumin 3.8 - 4.8 g/dL 3.8  Alpha-1 0.2 - 0.3 g/dL 0.3  Alpha-2 0.5 - 0.9 g/dL 0.7  Beta Globulin 0.4 - 0.6 g/dL 0.5  Beta 2 0.2 - 0.5 g/dL 0.7(H)  Gamma Globulin 0.8 - 1.7 g/dL 2.1(H)   G6PD No results found for: G6PDH TPMT No results found for: TPMT   Chest x-ray:  no acute abnormalities 11/22/2013  Assessment/Plan:   Administrations This Visit    Certolizumab Pegol KIT 400 mg    Admin Date 04/15/2020 Action Given Dose 400 mg Route Subcutaneous Administered By Deboraha Sprang, RPH-CPP         Patient tolerated injection without issue.   Appointment for next injection scheduled for 8/26 at 9AM.  Patient due for CBC/CMP in September.  Due for TB gold in June 2022.  Patient is to call  and reschedule appointment if running a fever with signs/symptoms of infection, on antibiotics for active infection or has an upcoming invasive procedure.  All questions encouraged and answered.  Instructed patient to call with any further questions or concerns.   Mariella Saa, PharmD, [place], CPP Clinical Specialty Pharmacist (Rheumatology and Pulmonology)  04/15/2020 9:48 AM

## 2020-04-15 NOTE — Telephone Encounter (Signed)
Patient requesting a copy of all knee xrays done here. Patient will pick up the CD when ready.

## 2020-04-16 NOTE — Telephone Encounter (Signed)
LMOM CD of BIL Knees at front desk

## 2020-05-08 ENCOUNTER — Other Ambulatory Visit: Payer: Self-pay | Admitting: Physician Assistant

## 2020-05-10 NOTE — Telephone Encounter (Signed)
Last Visit: 01/08/2020 Next Visit: 05/13/2020  Okay to refill per Dr. Estanislado Pandy

## 2020-05-13 ENCOUNTER — Ambulatory Visit (INDEPENDENT_AMBULATORY_CARE_PROVIDER_SITE_OTHER): Payer: 59 | Admitting: Pharmacist

## 2020-05-13 ENCOUNTER — Other Ambulatory Visit: Payer: Self-pay

## 2020-05-13 VITALS — BP 133/87 | HR 66

## 2020-05-13 DIAGNOSIS — M0579 Rheumatoid arthritis with rheumatoid factor of multiple sites without organ or systems involvement: Secondary | ICD-10-CM

## 2020-05-13 MED ORDER — CERTOLIZUMAB PEGOL 2 X 200 MG ~~LOC~~ KIT
400.0000 mg | PACK | Freq: Once | SUBCUTANEOUS | Status: AC
  Administered 2020-05-13: 400 mg via SUBCUTANEOUS

## 2020-05-13 NOTE — Progress Notes (Signed)
Pharmacy Note  Subjective:   Patient presents to clinic today to receive monthly dose of Cimzia.  Patient running a fever or have signs/symptoms of infection? No  Patient currently on antibiotics for the treatment of infection? No  Patient have any upcoming invasive procedures/surgeries? No  Objective: CMP     Component Value Date/Time   NA 138 02/18/2020 0904   NA 138 06/11/2018 1212   K 3.8 02/18/2020 0904   CL 102 02/18/2020 0904   CO2 28 02/18/2020 0904   GLUCOSE 83 02/18/2020 0904   BUN 14 02/18/2020 0904   BUN 9 06/11/2018 1212   CREATININE 0.99 02/18/2020 0904   CALCIUM 9.3 02/18/2020 0904   PROT 7.8 02/18/2020 0904   PROT 8.0 02/18/2020 0904   PROT 8.0 06/11/2018 1212   ALBUMIN 3.8 06/11/2018 1212   AST 15 02/18/2020 0904   ALT 11 02/18/2020 0904   ALKPHOS 69 06/11/2018 1212   BILITOT 0.6 02/18/2020 0904   BILITOT 0.3 06/11/2018 1212   GFRNONAA 69 02/18/2020 0904   GFRAA 80 02/18/2020 0904    CBC    Component Value Date/Time   WBC 5.7 02/18/2020 0904   RBC 4.20 02/18/2020 0904   HGB 12.5 02/18/2020 0904   HGB 11.9 06/11/2018 1212   HCT 38.3 02/18/2020 0904   HCT 36.6 06/11/2018 1212   PLT 367 02/18/2020 0904   PLT 310 06/11/2018 1212   MCV 91.2 02/18/2020 0904   MCV 93 06/11/2018 1212   MCH 29.8 02/18/2020 0904   MCHC 32.6 02/18/2020 0904   RDW 13.1 02/18/2020 0904   RDW 14.4 06/11/2018 1212   LYMPHSABS 2,936 02/18/2020 0904   LYMPHSABS 2.3 06/11/2018 1212   MONOABS 928 03/07/2017 1632   EOSABS 68 02/18/2020 0904   EOSABS 0.2 06/11/2018 1212   BASOSABS 29 02/18/2020 0904   BASOSABS 0.0 06/11/2018 1212    Baseline Immunosuppressant Therapy Labs TB GOLD Quantiferon TB Gold Latest Ref Rng & Units 02/18/2020  Quantiferon TB Gold Plus NEGATIVE NEGATIVE   Hepatitis Panel Hepatitis Latest Ref Rng & Units 06/13/2019  Hep B Surface Ag NON-REACTI NON-REACTIVE  Hep B IgM NON-REACTI NON-REACTIVE  Hep C Ab NON-REACTI NON-REACTIVE  Hep C Ab NON-REACTI  NON-REACTIVE  Hep A IgM NON-REACTI NON-REACTIVE   HIV Lab Results  Component Value Date   HIV NON-REACTIVE 02/18/2020   Immunoglobulins Immunoglobulin Electrophoresis Latest Ref Rng & Units 02/18/2020  IgA  47 - 310 mg/dL 651(H)  IgG 600 - 1,640 mg/dL 2,218(H)  IgM 50 - 300 mg/dL 78   SPEP Serum Protein Electrophoresis Latest Ref Rng & Units 02/18/2020  Total Protein 6.1 - 8.1 g/dL 8.0  Albumin 3.8 - 4.8 g/dL 3.8  Alpha-1 0.2 - 0.3 g/dL 0.3  Alpha-2 0.5 - 0.9 g/dL 0.7  Beta Globulin 0.4 - 0.6 g/dL 0.5  Beta 2 0.2 - 0.5 g/dL 0.7(H)  Gamma Globulin 0.8 - 1.7 g/dL 2.1(H)   G6PD No results found for: G6PDH TPMT No results found for: TPMT   Chest x-ray: no acute abnormalities 11/22/2013  Assessment/Plan:   Patient tolerated injection without issue.   Appointment for next injection scheduled for 9/23 @ 9AM.  Patient due for CBC/CMP in September with her next injection and TB gold in June 2022.  Patient is to call and reschedule appointment if running a fever with signs/symptoms of infection, on antibiotics for active infection or has an upcoming invasive procedure.  Patient was to have follow-up appointment end of July and canceled.  She is  overdue for her follow-up appointment.  Instructed patient to schedule follow-up with provider.  All questions encouraged and answered.  Instructed patient to call with any further questions or concerns.  Mariella Saa, PharmD, [place], CPP Clinical Specialty Pharmacist (Rheumatology and Pulmonology)  05/13/2020 9:40 AM

## 2020-06-09 NOTE — Telephone Encounter (Signed)
I dont see lab orders     If needed would get fasting  Bmp   Insulin and hg a1c   But not sure what lab she is getting

## 2020-06-10 ENCOUNTER — Other Ambulatory Visit: Payer: Self-pay

## 2020-06-10 ENCOUNTER — Ambulatory Visit (INDEPENDENT_AMBULATORY_CARE_PROVIDER_SITE_OTHER): Payer: 59 | Admitting: Pharmacist

## 2020-06-10 DIAGNOSIS — M0579 Rheumatoid arthritis with rheumatoid factor of multiple sites without organ or systems involvement: Secondary | ICD-10-CM | POA: Diagnosis not present

## 2020-06-10 DIAGNOSIS — Z79899 Other long term (current) drug therapy: Secondary | ICD-10-CM

## 2020-06-10 LAB — COMPLETE METABOLIC PANEL WITH GFR
AG Ratio: 1 (calc) (ref 1.0–2.5)
ALT: 9 U/L (ref 6–29)
AST: 14 U/L (ref 10–30)
Albumin: 4 g/dL (ref 3.6–5.1)
Alkaline phosphatase (APISO): 73 U/L (ref 31–125)
BUN: 14 mg/dL (ref 7–25)
CO2: 25 mmol/L (ref 20–32)
Calcium: 9.3 mg/dL (ref 8.6–10.2)
Chloride: 104 mmol/L (ref 98–110)
Creat: 0.93 mg/dL (ref 0.50–1.10)
GFR, Est African American: 87 mL/min/{1.73_m2} (ref >=60)
GFR, Est Non African American: 75 mL/min/{1.73_m2} (ref >=60)
Globulin: 4 g/dL (calc) — ABNORMAL HIGH (ref 1.9–3.7)
Glucose, Bld: 82 mg/dL (ref 65–99)
Potassium: 4 mmol/L (ref 3.5–5.3)
Sodium: 139 mmol/L (ref 135–146)
Total Bilirubin: 0.6 mg/dL (ref 0.2–1.2)
Total Protein: 8 g/dL (ref 6.1–8.1)

## 2020-06-10 LAB — CBC WITH DIFFERENTIAL/PLATELET
Absolute Monocytes: 470 cells/uL (ref 200–950)
Basophils Absolute: 28 cells/uL (ref 0–200)
Basophils Relative: 0.6 %
Eosinophils Absolute: 108 cells/uL (ref 15–500)
Eosinophils Relative: 2.3 %
HCT: 41.2 % (ref 35.0–45.0)
Hemoglobin: 13.6 g/dL (ref 11.7–15.5)
Lymphs Abs: 2515 cells/uL (ref 850–3900)
MCH: 30.8 pg (ref 27.0–33.0)
MCHC: 33 g/dL (ref 32.0–36.0)
MCV: 93.4 fL (ref 80.0–100.0)
MPV: 11.2 fL (ref 7.5–12.5)
Monocytes Relative: 10 %
Neutro Abs: 1579 cells/uL (ref 1500–7800)
Neutrophils Relative %: 33.6 %
Platelets: 315 10*3/uL (ref 140–400)
RBC: 4.41 10*6/uL (ref 3.80–5.10)
RDW: 12.8 % (ref 11.0–15.0)
Total Lymphocyte: 53.5 %
WBC: 4.7 10*3/uL (ref 3.8–10.8)

## 2020-06-10 MED ORDER — CERTOLIZUMAB PEGOL 2 X 200 MG ~~LOC~~ KIT
400.0000 mg | PACK | Freq: Once | SUBCUTANEOUS | Status: AC
  Administered 2020-06-10: 400 mg via SUBCUTANEOUS

## 2020-06-10 NOTE — Progress Notes (Signed)
Pharmacy Note  Subjective:   Patient presents to clinic today to receive monthly dose of Cimzia.  Patient running a fever or have signs/symptoms of infection? No  Patient currently on antibiotics for the treatment of infection? No  Patient have any upcoming invasive procedures/surgeries? No  Objective: CMP     Component Value Date/Time   NA 138 02/18/2020 0904   NA 138 06/11/2018 1212   K 3.8 02/18/2020 0904   CL 102 02/18/2020 0904   CO2 28 02/18/2020 0904   GLUCOSE 83 02/18/2020 0904   BUN 14 02/18/2020 0904   BUN 9 06/11/2018 1212   CREATININE 0.99 02/18/2020 0904   CALCIUM 9.3 02/18/2020 0904   PROT 7.8 02/18/2020 0904   PROT 8.0 02/18/2020 0904   PROT 8.0 06/11/2018 1212   ALBUMIN 3.8 06/11/2018 1212   AST 15 02/18/2020 0904   ALT 11 02/18/2020 0904   ALKPHOS 69 06/11/2018 1212   BILITOT 0.6 02/18/2020 0904   BILITOT 0.3 06/11/2018 1212   GFRNONAA 69 02/18/2020 0904   GFRAA 80 02/18/2020 0904    CBC    Component Value Date/Time   WBC 5.7 02/18/2020 0904   RBC 4.20 02/18/2020 0904   HGB 12.5 02/18/2020 0904   HGB 11.9 06/11/2018 1212   HCT 38.3 02/18/2020 0904   HCT 36.6 06/11/2018 1212   PLT 367 02/18/2020 0904   PLT 310 06/11/2018 1212   MCV 91.2 02/18/2020 0904   MCV 93 06/11/2018 1212   MCH 29.8 02/18/2020 0904   MCHC 32.6 02/18/2020 0904   RDW 13.1 02/18/2020 0904   RDW 14.4 06/11/2018 1212   LYMPHSABS 2,936 02/18/2020 0904   LYMPHSABS 2.3 06/11/2018 1212   MONOABS 928 03/07/2017 1632   EOSABS 68 02/18/2020 0904   EOSABS 0.2 06/11/2018 1212   BASOSABS 29 02/18/2020 0904   BASOSABS 0.0 06/11/2018 1212    Baseline Immunosuppressant Therapy Labs TB GOLD Quantiferon TB Gold Latest Ref Rng & Units 02/18/2020  Quantiferon TB Gold Plus NEGATIVE NEGATIVE   Hepatitis Panel Hepatitis Latest Ref Rng & Units 06/13/2019  Hep B Surface Ag NON-REACTI NON-REACTIVE  Hep B IgM NON-REACTI NON-REACTIVE  Hep C Ab NON-REACTI NON-REACTIVE  Hep C Ab NON-REACTI  NON-REACTIVE  Hep A IgM NON-REACTI NON-REACTIVE   HIV Lab Results  Component Value Date   HIV NON-REACTIVE 02/18/2020   Immunoglobulins Immunoglobulin Electrophoresis Latest Ref Rng & Units 02/18/2020  IgA  47 - 310 mg/dL 651(H)  IgG 600 - 1,640 mg/dL 2,218(H)  IgM 50 - 300 mg/dL 78   SPEP Serum Protein Electrophoresis Latest Ref Rng & Units 02/18/2020  Total Protein 6.1 - 8.1 g/dL 8.0  Albumin 3.8 - 4.8 g/dL 3.8  Alpha-1 0.2 - 0.3 g/dL 0.3  Alpha-2 0.5 - 0.9 g/dL 0.7  Beta Globulin 0.4 - 0.6 g/dL 0.5  Beta 2 0.2 - 0.5 g/dL 0.7(H)  Gamma Globulin 0.8 - 1.7 g/dL 2.1(H)   G6PD No results found for: G6PDH TPMT No results found for: TPMT   Chest x-ray: no acute abnormalities 11/22/2013  Assessment/Plan:   Administrations This Visit    Certolizumab Pegol KIT 400 mg    Admin Date 06/10/2020 Action Given Dose 400 mg Route Subcutaneous Administered By Deboraha Sprang, RPH-CPP         Patient tolerated injection without issue.   CBC/CMP drawn today.  Due for TB gold in June 2022.  Patient is due for a follow-up appointment and instructed patient to schedule in the next 6-8 weeks.  Patient is to call and reschedule appointment if running a fever with signs/symptoms of infection, on antibiotics for active infection or has an upcoming invasive procedure.  All questions encouraged and answered.  Instructed patient to call with any further questions or concerns.  Mariella Saa, PharmD, [place], CPP Clinical Specialty Pharmacist (Rheumatology and Pulmonology)  06/10/2020 9:40 AM

## 2020-06-11 NOTE — Progress Notes (Signed)
CBC/CMP within normal limits.  Continue Cimzia, MTX and folic acid.

## 2020-07-01 NOTE — Progress Notes (Signed)
Chief Complaint  Patient presents with  . Follow-up    check for insulin resistance    HPI: MIAKODA MCMILLION 44 y.o. come in for  Concern  About   Hard time to lose weight   And   254 and stopped.  Nutritionist .  Advice not helpful   With her particular problem  bp in 130  Usually  Better   Eating 3 x   b12 shot  [facility]  Now done   On ocps  For mtx    Still cramps with thyroid.  Dr Radene Knee   But  For   ocps   ? About insulin resistance   No hx of same no dx pco but has had bleeding inssues in past  Myoma  MHA on  New med works better  Right knee  OA problem  Mobility  ROS: See pertinent positives and negatives per HPI.  Past Medical History:  Diagnosis Date  . Acute otitis media 08/28/2012   improved  change to liquid medication   . Anxiety   . Breast abscess of female    Recurrent  . Depression   . Family history of adverse reaction to anesthesia    father hard time waking once (12/20/2016)  . Fibroids    w/bleeding  . GERD (gastroesophageal reflux disease)   . History of blood transfusion    after surgery  . Hypertension   . Migraines    Dr. Orie Rout; "I have a few/month" (12/20/2016)  . Osteoarthritis   . Pill esophagitis 08/28/2012   amoxicillin  by hx  disc plan nl voice except hoarse not drooling  close fu  if not getting better with plan stop aleve  liquid ibu onlyf necessary   . Recurrent periodic urticaria   . Rheumatoid arthritis ([place])    "55% of my body" (12/20/2016)  . Sleep apnea    wears cpap    Family History  Problem Relation Age of Onset  . Hypertension Mother   . Rheum arthritis Mother   . Hypertension Father   . Sleep apnea Father     Social History   Socioeconomic History  . Marital status: Significant Other    Spouse name: Not on file  . Number of children: Not on file  . Years of education: Not on file  . Highest education level: Not on file  Occupational History  . Not on file  Tobacco Use  . Smoking status: Never  Smoker  . Smokeless tobacco: Never Used  Vaping Use  . Vaping Use: Never used  Substance and Sexual Activity  . Alcohol use: Yes    Comment: social  . Drug use: Never  . Sexual activity: Not Currently    Birth control/protection: None  Other Topics Concern  . Not on file  Social History Narrative  . Not on file   Social Determinants of Health   Financial Resource Strain:   . Difficulty of Paying Living Expenses: Not on file  Food Insecurity:   . Worried About Charity fundraiser in the Last Year: Not on file  . Ran Out of Food in the Last Year: Not on file  Transportation Needs:   . Lack of Transportation (Medical): Not on file  . Lack of Transportation (Non-Medical): Not on file  Physical Activity:   . Days of Exercise per Week: Not on file  . Minutes of Exercise per Session: Not on file  Stress:   . Feeling of  Stress : Not on file  Social Connections:   . Frequency of Communication with Friends and Family: Not on file  . Frequency of Social Gatherings with Friends and Family: Not on file  . Attends Religious Services: Not on file  . Active Member of Clubs or Organizations: Not on file  . Attends Archivist Meetings: Not on file  . Marital Status: Not on file    Outpatient Medications Prior to Visit  Medication Sig Dispense Refill  . albuterol (VENTOLIN HFA) 108 (90 Base) MCG/ACT inhaler TAKE 2 PUFFS BY MOUTH EVERY 6 HOURS AS NEEDED FOR WHEEZE OR SHORTNESS OF BREATH 18 g 1  . amLODipine (NORVASC) 5 MG tablet TAKE 1 TABLET BY MOUTH EVERY DAY 90 tablet 1  . CIMZIA 2 X 200 MG KIT INJECT 400 MG UNDER THE SKIN EVERY 28 (TWENTY-EIGHT) DAYS 1 each 2  . diclofenac sodium (VOLTAREN) 1 % GEL APPLY 4 G TOPICALLY 2 (TWO) TIMES DAILY. 400 g 3  . ELDERBERRY PO Take by mouth daily.    [name] eletriptan (RELPAX) 40 MG tablet Take 1 tablet (40 mg total) by mouth as needed for migraine or headache. May repeat in 2 hours if headache persists or recurs. 10 tablet 1  . fluocinonide  ointment (LIDEX) 6.19 % Apply 1 application topically 2 (two) times daily.   5  . folic acid (FOLVITE) 1 MG tablet TAKE 2 TABLETS BY MOUTH EVERY DAY 180 tablet 2  . Levonorgestrel (KYLEENA) 19.5 MG IUD Kyleena 17.5 mcg/24 hrs (53yr) 19.539mintrauterine device  Take 1 device by intrauterine route.    . Melatonin 10 MG CAPS Take 20 mg by mouth at bedtime as needed (for sleep).    . Methotrexate, PF, (RASUVO) 20 MG/0.4ML SOAJ Inject 20 mg into the skin once a week. 12 pen 0  . rizatriptan (MAXALT-MLT) 10 MG disintegrating tablet Take 1 tablet (10 mg total) by mouth as needed for migraine. May repeat in 2 hours if needed 3 tablet 0  . SUMAtriptan (TOSYMRA) 10 MG/ACT SOLN Place 1 spray into the nose every hour as needed (Maximum 3 sprays in 24 hours.). 6 each 11  . benzonatate (TESSALON PERLES) 100 MG capsule Take 1 capsule (100 mg total) by mouth 3 (three) times daily as needed. (Patient not taking: Reported on 01/08/2020) 20 capsule 0  . clindamycin-benzoyl peroxide (BENZACLIN) gel Apply 1 application topically every other day.  (Patient not taking: Reported on 07/02/2020)    . cyclobenzaprine (FLEXERIL) 10 MG tablet TAKE 1 TABLET BY MOUTH EVERYDAY AT BEDTIME (Patient not taking: Reported on 07/02/2020) 20 tablet 0  . doxycycline (VIBRA-TABS) 100 MG tablet Take 1 tablet (100 mg total) by mouth 2 (two) times daily. (Patient not taking: Reported on 01/08/2020) 14 tablet 0  . methocarbamol (ROBAXIN) 500 MG tablet Take 500 mg by mouth 2 (two) times daily between meals as needed for muscle spasms. (Patient not taking: Reported on 07/02/2020)    . naproxen (NAPROSYN) 500 MG tablet as needed.  (Patient not taking: Reported on 07/02/2020)    . OVER THE COUNTER MEDICATION 3 capsules daily. (Patient not taking: Reported on 07/02/2020)     No facility-administered medications prior to visit.     EXAM:  BP 138/88   Pulse 67   Temp 97.9 F (36.6 C)   Resp 20   Ht 5' 4.57" (1.64 m)   Wt 264 lb 12.8 oz  (120.1 kg)   SpO2 98%   BMI 44.66 kg/m   Body  mass index is 44.66 kg/m.  GENERAL: vitals reviewed and listed above, alert, oriented, appears well hydrated and in no acute distress HEENT: atraumatic, conjunctiva  clear, no obvious abnormalities on inspection of external nose and ears OP :masked  NECK: no obvious masses on inspection palpation  LUNGS: clear to auscultation bilaterally, no wheezes, rales or rhonchi, good air movement Abdomen:  Sof,t normal bowel sounds without hepatosplenomegaly, no guarding rebound or masses no CVA tenderness CV: HRRR, no clubbing cyanosis or  peripheral edema nl cap refill  MS: moves all extremities without noticeable focal  abnormality PSYCH: pleasant and cooperative, no obvious depression or anxiety Lab Results  Component Value Date   WBC 4.7 06/10/2020   HGB 13.6 06/10/2020   HCT 41.2 06/10/2020   PLT 315 06/10/2020   GLUCOSE 82 06/10/2020   CHOL 189 06/11/2018   TRIG 52 06/11/2018   HDL 71 06/11/2018   LDLCALC 108 (H) 06/11/2018   ALT 9 06/10/2020   AST 14 06/10/2020   NA 139 06/10/2020   K 4.0 06/10/2020   CL 104 06/10/2020   CREATININE 0.93 06/10/2020   BUN 14 06/10/2020   CO2 25 06/10/2020   TSH 1.950 06/11/2018   INR 1.20 09/07/2016   HGBA1C 5.6 06/11/2018   BP Readings from Last 3 Encounters:  07/02/20 138/88  05/13/20 133/87  04/15/20 (!) 139/82    ASSESSMENT AND PLAN:  Discussed the following assessment and plan:  Class 3 severe obesity with serious comorbidity and body mass index (BMI) of 50.0 to 59.9 in adult, unspecified obesity type (HCC) - Plan: T4, free, TSH, Hemoglobin A1c, Lipid panel, BASIC METABOLIC PANEL WITH GFR, BASIC METABOLIC PANEL WITH GFR, Lipid panel, Hemoglobin A1c, TSH, T4, free  Essential hypertension - Plan: T4, free, TSH, Hemoglobin A1c, Lipid panel, BASIC METABOLIC PANEL WITH GFR, BASIC METABOLIC PANEL WITH GFR, Lipid panel, Hemoglobin A1c, TSH, T4, free  Rheumatoid arthritis involving multiple  sites with positive rheumatoid factor (HCC) - Plan: T4, free, TSH, Hemoglobin A1c, Lipid panel, BASIC METABOLIC PANEL WITH GFR, BASIC METABOLIC PANEL WITH GFR, Lipid panel, Hemoglobin A1c, TSH, T4, free  History of 2019 novel coronavirus disease (COVID-19)  Primary osteoarthritis of both knees Metabolic syndrome possible  Could consider metformin but   More important help with lsi   Consider coaching apps  And weight watchers     Other meds  Let me know if need referrals or med indication for  Flex spending etc  35 minute review  Counsel and plans  -Patient advised to return or notify health care team  if  new concerns arise.  Patient Instructions  consider weight watchers  Lab today   Tracking  Input  As needed.     Standley Brooking. Destry Dauber M.D.

## 2020-07-02 ENCOUNTER — Other Ambulatory Visit: Payer: Self-pay

## 2020-07-02 ENCOUNTER — Ambulatory Visit (INDEPENDENT_AMBULATORY_CARE_PROVIDER_SITE_OTHER): Payer: 59 | Admitting: Internal Medicine

## 2020-07-02 ENCOUNTER — Encounter: Payer: Self-pay | Admitting: Internal Medicine

## 2020-07-02 VITALS — BP 138/88 | HR 67 | Temp 97.9°F | Resp 20 | Ht 64.57 in | Wt 264.8 lb

## 2020-07-02 DIAGNOSIS — Z6841 Body Mass Index (BMI) 40.0 and over, adult: Secondary | ICD-10-CM

## 2020-07-02 DIAGNOSIS — I10 Essential (primary) hypertension: Secondary | ICD-10-CM | POA: Diagnosis not present

## 2020-07-02 DIAGNOSIS — Z8616 Personal history of COVID-19: Secondary | ICD-10-CM | POA: Diagnosis not present

## 2020-07-02 DIAGNOSIS — M0579 Rheumatoid arthritis with rheumatoid factor of multiple sites without organ or systems involvement: Secondary | ICD-10-CM

## 2020-07-02 DIAGNOSIS — M170 Bilateral primary osteoarthritis of knee: Secondary | ICD-10-CM

## 2020-07-02 NOTE — Patient Instructions (Signed)
consider weight watchers  Lab today   Tracking  Input  As needed.

## 2020-07-03 LAB — LIPID PANEL
Cholesterol: 175 mg/dL (ref <200)
HDL: 75 mg/dL (ref >=50)
LDL Cholesterol (Calc): 87 mg/dL (calc)
Non-HDL Cholesterol (Calc): 100 mg/dL (calc) (ref <130)
Total CHOL/HDL Ratio: 2.3 (calc) (ref <5.0)
Triglycerides: 52 mg/dL (ref <150)

## 2020-07-03 LAB — BASIC METABOLIC PANEL WITH GFR
BUN: 18 mg/dL (ref 7–25)
CO2: 25 mmol/L (ref 20–32)
Calcium: 9.1 mg/dL (ref 8.6–10.2)
Chloride: 107 mmol/L (ref 98–110)
Creat: 0.95 mg/dL (ref 0.50–1.10)
GFR, Est African American: 84 mL/min/{1.73_m2} (ref >=60)
GFR, Est Non African American: 72 mL/min/{1.73_m2} (ref >=60)
Glucose, Bld: 89 mg/dL (ref 65–99)
Potassium: 4.3 mmol/L (ref 3.5–5.3)
Sodium: 141 mmol/L (ref 135–146)

## 2020-07-03 LAB — HEMOGLOBIN A1C
Hgb A1c MFr Bld: 5.5 % of total Hgb (ref <5.7)
Mean Plasma Glucose: 111 (calc)
eAG (mmol/L): 6.2 (calc)

## 2020-07-03 LAB — TSH: TSH: 1.46 mIU/L

## 2020-07-03 LAB — T4, FREE: Free T4: 1.5 ng/dL (ref 0.8–1.8)

## 2020-07-06 NOTE — Progress Notes (Signed)
Blood sugar and cholesterol and  thyroid labs are normal . Which is reassuring  NO new problem  seen causing the weight problem .  Does not rule out insulin resistance but   advice is the same as we discussed . Let us know if need documentation of medical needs for weight watchers or other programs  there was a new study that reported weight loss can help pain of knee ostearthritis .  Easier said than done but can help

## 2020-07-09 ENCOUNTER — Other Ambulatory Visit: Payer: Self-pay

## 2020-07-09 ENCOUNTER — Ambulatory Visit (INDEPENDENT_AMBULATORY_CARE_PROVIDER_SITE_OTHER): Payer: 59 | Admitting: *Deleted

## 2020-07-09 VITALS — BP 138/90 | HR 67

## 2020-07-09 DIAGNOSIS — M0579 Rheumatoid arthritis with rheumatoid factor of multiple sites without organ or systems involvement: Secondary | ICD-10-CM | POA: Diagnosis not present

## 2020-07-09 MED ORDER — CERTOLIZUMAB PEGOL 2 X 200 MG ~~LOC~~ KIT
400.0000 mg | PACK | Freq: Once | SUBCUTANEOUS | Status: AC
  Administered 2020-07-09: 400 mg via SUBCUTANEOUS

## 2020-07-09 NOTE — Progress Notes (Signed)
Pharmacy Note  Subjective:   Patient presents to clinic today to receive monthly dose of Cimzia.  Patient running a fever or have signs/symptoms of infection? No  Patient currently on antibiotics for the treatment of infection? No  Patient have any upcoming invasive procedures/surgeries? No  Objective: CMP     Component Value Date/Time   NA 141 07/02/2020 1106   NA 138 06/11/2018 1212   K 4.3 07/02/2020 1106   CL 107 07/02/2020 1106   CO2 25 07/02/2020 1106   GLUCOSE 89 07/02/2020 1106   BUN 18 07/02/2020 1106   BUN 9 06/11/2018 1212   CREATININE 0.95 07/02/2020 1106   CALCIUM 9.1 07/02/2020 1106   PROT 8.0 06/10/2020 0909   PROT 8.0 06/11/2018 1212   ALBUMIN 3.8 06/11/2018 1212   AST 14 06/10/2020 0909   ALT 9 06/10/2020 0909   ALKPHOS 69 06/11/2018 1212   BILITOT 0.6 06/10/2020 0909   BILITOT 0.3 06/11/2018 1212   GFRNONAA 72 07/02/2020 1106   GFRAA 84 07/02/2020 1106    CBC    Component Value Date/Time   WBC 4.7 06/10/2020 0909   RBC 4.41 06/10/2020 0909   HGB 13.6 06/10/2020 0909   HGB 11.9 06/11/2018 1212   HCT 41.2 06/10/2020 0909   HCT 36.6 06/11/2018 1212   PLT 315 06/10/2020 0909   PLT 310 06/11/2018 1212   MCV 93.4 06/10/2020 0909   MCV 93 06/11/2018 1212   MCH 30.8 06/10/2020 0909   MCHC 33.0 06/10/2020 0909   RDW 12.8 06/10/2020 0909   RDW 14.4 06/11/2018 1212   LYMPHSABS 2,515 06/10/2020 0909   LYMPHSABS 2.3 06/11/2018 1212   MONOABS 928 03/07/2017 1632   EOSABS 108 06/10/2020 0909   EOSABS 0.2 06/11/2018 1212   BASOSABS 28 06/10/2020 0909   BASOSABS 0.0 06/11/2018 1212    Baseline Immunosuppressant Therapy Labs TB GOLD Quantiferon TB Gold Latest Ref Rng & Units 02/18/2020  Quantiferon TB Gold Plus NEGATIVE NEGATIVE   Hepatitis Panel Hepatitis Latest Ref Rng & Units 06/13/2019  Hep B Surface Ag NON-REACTI NON-REACTIVE  Hep B IgM NON-REACTI NON-REACTIVE  Hep C Ab NON-REACTI NON-REACTIVE  Hep C Ab NON-REACTI NON-REACTIVE  Hep A IgM  NON-REACTI NON-REACTIVE   HIV Lab Results  Component Value Date   HIV NON-REACTIVE 02/18/2020   Immunoglobulins Immunoglobulin Electrophoresis Latest Ref Rng & Units 02/18/2020  IgA  47 - 310 mg/dL 651(H)  IgG 600 - 1,640 mg/dL 2,218(H)  IgM 50 - 300 mg/dL 78   SPEP Serum Protein Electrophoresis Latest Ref Rng & Units 06/10/2020  Total Protein 6.1 - 8.1 g/dL 8.0  Albumin 3.8 - 4.8 g/dL -  Alpha-1 0.2 - 0.3 g/dL -  Alpha-2 0.5 - 0.9 g/dL -  Beta Globulin 0.4 - 0.6 g/dL -  Beta 2 0.2 - 0.5 g/dL -  Gamma Globulin 0.8 - 1.7 g/dL -   G6PD No results found for: G6PDH TPMT No results found for: TPMT   Chest x-ray: no acute abnormalities 11/22/2013  Administrations This Visit    Certolizumab Pegol KIT 400 mg    Admin Date 07/09/2020 Action Given Dose 400 mg Route Subcutaneous Administered By Carole Binning, LPN          Assessment/Plan:   Patient tolerated injection well.   Appointment for next injection scheduled for 08/06/2020.  Patient due for labs in December 2021.  Patient is to call and reschedule appointment if running a fever with signs/symptoms of infection, on antibiotics for active infection  or has an upcoming invasive procedure.  All questions encouraged and answered.  Instructed patient to call with any further questions or concerns.

## 2020-08-03 ENCOUNTER — Telehealth: Payer: Self-pay | Admitting: Rheumatology

## 2020-08-05 NOTE — Telephone Encounter (Signed)
Opened in error

## 2020-08-06 ENCOUNTER — Other Ambulatory Visit: Payer: Self-pay

## 2020-08-06 ENCOUNTER — Ambulatory Visit (INDEPENDENT_AMBULATORY_CARE_PROVIDER_SITE_OTHER): Payer: 59 | Admitting: *Deleted

## 2020-08-06 VITALS — BP 174/101 | HR 70

## 2020-08-06 DIAGNOSIS — M0579 Rheumatoid arthritis with rheumatoid factor of multiple sites without organ or systems involvement: Secondary | ICD-10-CM | POA: Diagnosis not present

## 2020-08-06 MED ORDER — CERTOLIZUMAB PEGOL 2 X 200 MG ~~LOC~~ KIT
400.0000 mg | PACK | Freq: Once | SUBCUTANEOUS | Status: AC
  Administered 2020-08-06: 400 mg via SUBCUTANEOUS

## 2020-08-06 NOTE — Progress Notes (Signed)
Pharmacy Note  Subjective:   Patient presents to clinic today to receive monthly dose of Cimzia.  Patient running a fever or have signs/symptoms of infection? No  Patient currently on antibiotics for the treatment of infection? No  Patient have any upcoming invasive procedures/surgeries? No  Objective: CMP     Component Value Date/Time   NA 141 07/02/2020 1106   NA 138 06/11/2018 1212   K 4.3 07/02/2020 1106   CL 107 07/02/2020 1106   CO2 25 07/02/2020 1106   GLUCOSE 89 07/02/2020 1106   BUN 18 07/02/2020 1106   BUN 9 06/11/2018 1212   CREATININE 0.95 07/02/2020 1106   CALCIUM 9.1 07/02/2020 1106   PROT 8.0 06/10/2020 0909   PROT 8.0 06/11/2018 1212   ALBUMIN 3.8 06/11/2018 1212   AST 14 06/10/2020 0909   ALT 9 06/10/2020 0909   ALKPHOS 69 06/11/2018 1212   BILITOT 0.6 06/10/2020 0909   BILITOT 0.3 06/11/2018 1212   GFRNONAA 72 07/02/2020 1106   GFRAA 84 07/02/2020 1106    CBC    Component Value Date/Time   WBC 4.7 06/10/2020 0909   RBC 4.41 06/10/2020 0909   HGB 13.6 06/10/2020 0909   HGB 11.9 06/11/2018 1212   HCT 41.2 06/10/2020 0909   HCT 36.6 06/11/2018 1212   PLT 315 06/10/2020 0909   PLT 310 06/11/2018 1212   MCV 93.4 06/10/2020 0909   MCV 93 06/11/2018 1212   MCH 30.8 06/10/2020 0909   MCHC 33.0 06/10/2020 0909   RDW 12.8 06/10/2020 0909   RDW 14.4 06/11/2018 1212   LYMPHSABS 2,515 06/10/2020 0909   LYMPHSABS 2.3 06/11/2018 1212   MONOABS 928 03/07/2017 1632   EOSABS 108 06/10/2020 0909   EOSABS 0.2 06/11/2018 1212   BASOSABS 28 06/10/2020 0909   BASOSABS 0.0 06/11/2018 1212    Baseline Immunosuppressant Therapy Labs TB GOLD Quantiferon TB Gold Latest Ref Rng & Units 02/18/2020  Quantiferon TB Gold Plus NEGATIVE NEGATIVE   Hepatitis Panel Hepatitis Latest Ref Rng & Units 06/13/2019  Hep B Surface Ag NON-REACTI NON-REACTIVE  Hep B IgM NON-REACTI NON-REACTIVE  Hep C Ab NON-REACTI NON-REACTIVE  Hep C Ab NON-REACTI NON-REACTIVE  Hep A IgM  NON-REACTI NON-REACTIVE   HIV Lab Results  Component Value Date   HIV NON-REACTIVE 02/18/2020   Immunoglobulins Immunoglobulin Electrophoresis Latest Ref Rng & Units 02/18/2020  IgA  47 - 310 mg/dL 651(H)  IgG 600 - 1,640 mg/dL 2,218(H)  IgM 50 - 300 mg/dL 78   SPEP Serum Protein Electrophoresis Latest Ref Rng & Units 06/10/2020  Total Protein 6.1 - 8.1 g/dL 8.0  Albumin 3.8 - 4.8 g/dL -  Alpha-1 0.2 - 0.3 g/dL -  Alpha-2 0.5 - 0.9 g/dL -  Beta Globulin 0.4 - 0.6 g/dL -  Beta 2 0.2 - 0.5 g/dL -  Gamma Globulin 0.8 - 1.7 g/dL -   G6PD No results found for: G6PDH TPMT No results found for: TPMT   Chest x-ray: no acute abnormalities 11/22/2013  Assessment/Plan:   Administrations This Visit    Certolizumab Pegol KIT 400 mg    Admin Date 08/06/2020 Action Given Dose 400 mg Route Subcutaneous Administered By Carole Binning, LPN         Patient tolerated injection well.   Appointment for next injection scheduled for 09/03/2020.  Patient due for labs in December 2021.  Patient is to call and reschedule appointment if running a fever with signs/symptoms of infection, on antibiotics for active infection or  has an upcoming invasive procedure.  All questions encouraged and answered.  Instructed patient to call with any further questions or concerns.

## 2020-08-09 ENCOUNTER — Ambulatory Visit (INDEPENDENT_AMBULATORY_CARE_PROVIDER_SITE_OTHER): Payer: 59 | Admitting: Podiatry

## 2020-08-09 ENCOUNTER — Encounter: Payer: Self-pay | Admitting: Podiatry

## 2020-08-09 ENCOUNTER — Other Ambulatory Visit: Payer: Self-pay

## 2020-08-09 DIAGNOSIS — M79674 Pain in right toe(s): Secondary | ICD-10-CM | POA: Diagnosis not present

## 2020-08-09 DIAGNOSIS — L603 Nail dystrophy: Secondary | ICD-10-CM

## 2020-08-17 ENCOUNTER — Encounter: Payer: Self-pay | Admitting: Podiatry

## 2020-08-18 NOTE — Progress Notes (Signed)
Subjective:   Patient ID: Jennifer [name], female   DOB: 44 y.o.   MRN: [id number]   HPI 44 year old female presents the office today for concerns of her left second toenail becoming thickened discolored becoming darker in color.  Occasionally get discomfort in the nail but denies any redness or drainage or any swelling.  She said no recent treatment for the nail.  She has no other concerns today.   Review of Systems  All other systems reviewed and are negative.  Past Medical History:  Diagnosis Date   Acute otitis media 08/28/2012   improved  change to liquid medication    Anxiety    Breast abscess of female    Recurrent   Depression    Family history of adverse reaction to anesthesia    father hard time waking once (12/20/2016)   Fibroids    w/bleeding   GERD (gastroesophageal reflux disease)    History of blood transfusion    after surgery   Hypertension    Migraines    Dr. Orie Rout; "I have a few/month" (12/20/2016)   Osteoarthritis    Pill esophagitis 08/28/2012   amoxicillin  by hx  disc plan nl voice except hoarse not drooling  close fu  if not getting better with plan stop aleve  liquid ibu onlyf necessary    Recurrent periodic urticaria    Rheumatoid arthritis ([place])    "55% of my body" (12/20/2016)   Sleep apnea    wears cpap    Past Surgical History:  Procedure Laterality Date   BREAST SURGERY     abcess under left breast    CYST EXCISION Left 2014   "elbow"   HERNIA REPAIR     INCISION AND DRAINAGE BREAST ABSCESS Left 2003   INCISIONAL HERNIA REPAIR N/A 12/18/2016   Procedure: REPAIR INCISIONAL HERNIA WITH MESH;  Surgeon: Georganna Skeans, MD;  Location: [place];  Service: General;  Laterality: N/A;   INSERTION OF MESH N/A 12/18/2016   Procedure: INSERTION OF MESH;  Surgeon: Georganna Skeans, MD;  Location: [place];  Service: General;  Laterality: N/A;   KNEE ARTHROSCOPY WITH MENISCAL REPAIR Right 06/17/2018   Procedure: RIGHT KNEE ARTHROSCOPY  WITH MENISCAL REPAIR;  Surgeon: Vickey Huger, MD;  Location[facility];  Service: Orthopedics;  Laterality: Right;   MYOMECTOMY N/A 09/06/2016   Procedure: Lendell Caprice;  Surgeon: Governor Specking, MD;  [facility];  Service: Gynecology;  Laterality: N/A;   UTERINE FIBROID SURGERY     35 fibroids     Current Outpatient Medications:    albuterol (VENTOLIN HFA) 108 (90 Base) MCG/ACT inhaler, TAKE 2 PUFFS BY MOUTH EVERY 6 HOURS AS NEEDED FOR WHEEZE OR SHORTNESS OF BREATH, Disp: 18 g, Rfl: 1   amLODipine (NORVASC) 5 MG tablet, TAKE 1 TABLET BY MOUTH EVERY DAY, Disp: 90 tablet, Rfl: 1   amoxicillin (AMOXIL) 500 MG capsule, Take 500 mg by mouth 3 (three) times daily., Disp: , Rfl:    benzonatate (TESSALON PERLES) 100 MG capsule, Take 1 capsule (100 mg total) by mouth 3 (three) times daily as needed. (Patient not taking: Reported on 01/08/2020), Disp: 20 capsule, Rfl: 0   CIMZIA 2 X 200 MG KIT, INJECT 400 MG UNDER THE SKIN EVERY 28 (TWENTY-EIGHT) DAYS, Disp: 1 each, Rfl: 2   clindamycin-benzoyl peroxide (BENZACLIN) gel, Apply 1 application topically every other day.  (Patient not taking: Reported on 07/02/2020), Disp: , Rfl:    cyclobenzaprine (FLEXERIL) 10 MG tablet, TAKE 1 TABLET  BY MOUTH EVERYDAY AT BEDTIME (Patient not taking: Reported on 07/02/2020), Disp: 20 tablet, Rfl: 0   diclofenac sodium (VOLTAREN) 1 % GEL, APPLY 4 G TOPICALLY 2 (TWO) TIMES DAILY., Disp: 400 g, Rfl: 3   doxycycline (VIBRA-TABS) 100 MG tablet, Take 1 tablet (100 mg total) by mouth 2 (two) times daily. (Patient not taking: Reported on 01/08/2020), Disp: 14 tablet, Rfl: 0   ELDERBERRY PO, Take by mouth daily., Disp: , Rfl:    eletriptan (RELPAX) 40 MG tablet, Take 1 tablet (40 mg total) by mouth as needed for migraine or headache. May repeat in 2 hours if headache persists or recurs., Disp: 10 tablet, Rfl: 1   fluconazole (DIFLUCAN) 150 MG tablet, Take 150 mg by mouth once., Disp: , Rfl:    fluocinonide  ointment (LIDEX) 2.09 %, Apply 1 application topically 2 (two) times daily. , Disp: , Rfl: 5   folic acid (FOLVITE) 1 MG tablet, TAKE 2 TABLETS BY MOUTH EVERY DAY, Disp: 180 tablet, Rfl: 2   Levonorgestrel (KYLEENA) 19.5 MG IUD, Kyleena 17.5 mcg/24 hrs (28yr) 19.565mintrauterine device  Take 1 device by intrauterine route., Disp: , Rfl:    Melatonin 10 MG CAPS, Take 20 mg by mouth at bedtime as needed (for sleep)., Disp: , Rfl:    meloxicam (MOBIC) 15 MG tablet, Take 15 mg by mouth daily., Disp: , Rfl:    methocarbamol (ROBAXIN) 500 MG tablet, Take 500 mg by mouth 2 (two) times daily between meals as needed for muscle spasms. (Patient not taking: Reported on 07/02/2020), Disp: , Rfl:    methotrexate (RHEUMATREX) 2.5 MG tablet, methotrexate sodium 2.5 mg tablet  TAKE 6 TABLETS (15 MG TOTAL) BY MOUTH ONCE A WEEK. (PROTECT FROM LIGHT), Disp: , Rfl:    Methotrexate, PF, (RASUVO) 20 MG/0.4ML SOAJ, Inject 20 mg into the skin once a week., Disp: 12 pen, Rfl: 0   naproxen (NAPROSYN) 500 MG tablet, as needed.  (Patient not taking: Reported on 07/02/2020), Disp: , Rfl:    neomycin-polymyxin-dexamethasone (MAXITROL) 0.1 % ophthalmic suspension, neomycin-polymyxin-dexameth 3.5 mg/mL-10,000 unit/mL-0.1% eye drops  APPLY 1 DROP IN THE AFFECTED EYE 4 TIMES DAILY X 7, Disp: , Rfl:    nystatin-triamcinolone ointment (MYCOLOG), Apply topically 2 (two) times daily., Disp: , Rfl:    OVER THE COUNTER MEDICATION, 3 capsules daily. (Patient not taking: Reported on 07/02/2020), Disp: , Rfl:    rizatriptan (MAXALT-MLT) 10 MG disintegrating tablet, Take 1 tablet (10 mg total) by mouth as needed for migraine. May repeat in 2 hours if needed, Disp: 3 tablet, Rfl: 0   SUMAtriptan (TOSYMRA) 10 MG/ACT SOLN, Place 1 spray into the nose every hour as needed (Maximum 3 sprays in 24 hours.)., Disp: 6 each, Rfl: 11  Allergies  Allergen Reactions   Lisinopril Anaphylaxis   Lisinopril-Hydrochlorothiazide Swelling and  Other (See Comments)    Angioedema  Has tolerated maxide in past so most likely  The ACE inhibitor as the cause         Objective:  Physical Exam  General: AAO x3, NAD  Dermatological: Left second digit nails hypertrophic, dystrophic with brown discoloration also darkened changes present.  There is no extension of any hyperpigmentation of the surrounding skin.  There is no edema, erythema or signs of infection noted today.  There is no open lesions.  Vascular: Dorsalis Pedis artery and Posterior Tibial artery pedal pulses are 2/4 bilateral with immedate capillary fill time. There is no pain with calf compression, swelling, warmth, erythema.   Neruologic:  Grossly intact via light touch bilateral.  Musculoskeletal: No gross boney pedal deformities bilateral. No pain, crepitus, or limitation noted with foot and ankle range of motion bilateral. Muscular strength 5/5 in all groups tested bilateral.  Gait: Unassisted, Nonantalgic.       Assessment:   Second digit toenail onychodystrophy with pigmented changes    Plan:  -Treatment options discussed including all alternatives, risks, and complications -Etiology of symptoms were discussed At this time I discussed with her nail removal, biopsy of the toenail.  At first he was hesitant to do this but discussed other options.  Discussion she elected to proceed with total nail removal and biopsy of the nail but we will plan on doing this next week.  Discussed the procedure as well as postoperative course.  Trula Slade DPM

## 2020-08-19 ENCOUNTER — Ambulatory Visit: Payer: 59 | Admitting: Podiatry

## 2020-08-20 ENCOUNTER — Ambulatory Visit (INDEPENDENT_AMBULATORY_CARE_PROVIDER_SITE_OTHER): Payer: 59 | Admitting: Podiatry

## 2020-08-20 ENCOUNTER — Other Ambulatory Visit: Payer: Self-pay

## 2020-08-20 DIAGNOSIS — L603 Nail dystrophy: Secondary | ICD-10-CM

## 2020-08-20 DIAGNOSIS — L819 Disorder of pigmentation, unspecified: Secondary | ICD-10-CM

## 2020-08-20 MED ORDER — CEPHALEXIN 500 MG PO CAPS: 500.0000 mg | ORAL_CAPSULE | Freq: Three times a day (TID) | ORAL | 0 refills | Status: DC

## 2020-08-20 NOTE — Progress Notes (Signed)
Subjective: 44 year old female presents the office today to have her right second toenail removed, culture/biopsy.  The nails thick and darkened compared to the other toenails.  No redness or drainage or any swelling.  She has no new concerns today. Denies any systemic complaints such as fevers, chills, nausea, vomiting. No acute changes since last appointment, and no other complaints at this time.   Objective: AAO x3, NAD DP/PT pulses palpable bilaterally, CRT less than 3 seconds Right second digit toenail is hypertrophic, dystrophic with darkened discoloration.  No erythema around the nail is no ascending cellulitis.  No hyperpigmentation of surrounding skin prior to the nail removal.  See procedure note below.  No open lesions. No pain with calf compression, swelling, warmth, erythema  Assessment: Right hallux onychodystrophy, hyperpigmented nail changes  Plan: -All treatment options discussed with the patient including all alternatives, risks, complications.  -At this time I recommended right second digit toenail removal, biopsy.  Discussed risks of doing this and consent was signed.  Skin was cleaned with alcohol and 3 cc of lidocaine, Marcaine plain was infiltrated in a digital block fashion.  Tourniquet was applied.  The right second digit toenail was then removed in total.  There was some hyperpigmentation into the matrix of the nail.  I used punch biopsy ordered to biopsy the matrix.  This was sent separately for pathology.  Wound was irrigated with saline hemostasis achieved.  Incision was dressed with Silvadene followed by dry sterile dressing.  The tourniquet was released and found to be immediate capillary fill time of the toe.  Tolerated the procedure well. -Patient encouraged to call the office with any questions, concerns, change in symptoms.   Trula Slade DPM

## 2020-08-20 NOTE — Patient Instructions (Signed)

## 2020-08-27 ENCOUNTER — Ambulatory Visit (INDEPENDENT_AMBULATORY_CARE_PROVIDER_SITE_OTHER): Payer: 59 | Admitting: Podiatry

## 2020-08-27 ENCOUNTER — Other Ambulatory Visit: Payer: Self-pay

## 2020-08-27 DIAGNOSIS — L819 Disorder of pigmentation, unspecified: Secondary | ICD-10-CM

## 2020-08-27 DIAGNOSIS — L603 Nail dystrophy: Secondary | ICD-10-CM

## 2020-08-27 DIAGNOSIS — M79674 Pain in right toe(s): Secondary | ICD-10-CM

## 2020-08-27 NOTE — Progress Notes (Signed)
Subjective: Jennifer [name] is a 44 y.o. female returns to office today for follow up evaluation after having right second digit toenail removal, biopsy performed. Patient has been soaking using Epsom salts and applying topical antibiotic covered with gauze and a dressing.  States that she is swelling some tenderness but denies any drainage or pus.  No red streaks.  Patient denies fevers, chills, nausea, vomiting. Denies any calf pain, chest pain, SOB.   Objective:  Vitals: Reviewed  General: Well developed, nourished, in no acute distress, alert and oriented x3   Dermatology: Skin is warm, dry and supple bilateral.  Right second nail border appears to be clean, dry, with mild granular tissue and surrounding scab. There is no surrounding erythema, edema, drainage/purulence. The remaining nails appear unremarkable at this time. There are no other lesions or other signs of infection present.  Neurovascular status: Intact. No lower extremity swelling; No pain with calf compression bilateral.  Musculoskeletal: Still some tenderness to palpation of the right second nail bed. Muscular strength within normal limits bilateral.   Assesement and Plan: S/p total nail avulsion, biopsy, doing well.   -Continue soaking in epsom salts twice a day followed by antibiotic ointment and a band-aid. Can leave uncovered at night. Continue this until completely healed.  -If the area has not healed in 2 weeks, call the office for follow-up appointment, or sooner if any problems arise.  -Monitor for any signs/symptoms of infection. Call the office immediately if any occur or go directly to the emergency room. Call with any questions/concerns.  *Awaiting biopsy results  Celesta Gentile, DPM

## 2020-09-13 ENCOUNTER — Encounter: Payer: Self-pay | Admitting: Podiatry

## 2020-09-22 ENCOUNTER — Telehealth: Payer: Self-pay | Admitting: Pharmacy Technician

## 2020-09-22 NOTE — Telephone Encounter (Signed)
Received fax from [employer] that patient's Cim[employer] is due for a new pre-certification.  Completed form and sent to office for MD signature.

## 2020-09-23 NOTE — Telephone Encounter (Signed)
Signed provider pre-certification form for [employer] renewal faxed to [facility].  Fax: [phone]

## 2020-09-28 NOTE — Telephone Encounter (Signed)
Received notification from [facility] regarding a prior authorization for [facility] In Office- [phone]. Authorization has been APPROVED from 09/23/20 to 09/22/21.   Authorization # [id number]

## 2020-09-29 NOTE — Telephone Encounter (Signed)
You need to be seen    Get her a respiratory Respiratory clinic visit   We can do a virtual tomorrow but I  I advise getting  Testing  Strep and covid test ( can lab arrange that here?)    ( Covid can start with  Sore throat  But usually also get congestion )

## 2020-09-29 NOTE — Telephone Encounter (Signed)
Pt Video apt schedule for 09/30/20 at 9:30am. -JMA

## 2020-09-30 ENCOUNTER — Encounter: Payer: Self-pay | Admitting: Internal Medicine

## 2020-09-30 ENCOUNTER — Telehealth: Payer: Self-pay | Admitting: *Deleted

## 2020-09-30 ENCOUNTER — Telehealth (INDEPENDENT_AMBULATORY_CARE_PROVIDER_SITE_OTHER): Payer: 59 | Admitting: Internal Medicine

## 2020-09-30 VITALS — BP 144/93 | Temp 98.4°F

## 2020-09-30 DIAGNOSIS — D849 Immunodeficiency, unspecified: Secondary | ICD-10-CM | POA: Diagnosis not present

## 2020-09-30 DIAGNOSIS — M0579 Rheumatoid arthritis with rheumatoid factor of multiple sites without organ or systems involvement: Secondary | ICD-10-CM

## 2020-09-30 DIAGNOSIS — Z79899 Other long term (current) drug therapy: Secondary | ICD-10-CM | POA: Diagnosis not present

## 2020-09-30 DIAGNOSIS — J029 Acute pharyngitis, unspecified: Secondary | ICD-10-CM

## 2020-09-30 MED ORDER — AMOXICILLIN-POT CLAVULANATE 600-42.9 MG/5ML PO SUSR: 600.0000 mg | Freq: Two times a day (BID) | ORAL | 0 refills | Status: DC

## 2020-09-30 NOTE — Progress Notes (Signed)
Virtual Visit via Video Note  I connected with@ on 09/30/20 at  9:30 AM EST by a video enabled telemedicine application and verified that I am speaking with the correct person using two identifiers. Location patient: home Location provider: home office Persons participating in the virtual visit: patient, provider  [place] national recommendations  regarding COVID 19 pandemic   video visit is advised over in office visit for this patient.  Patient aware  of the limitations of evaluation and management by telemedicine and  availability of in person appointments. and agreed to proceed.   HPI: Jennifer [name] presents for video visit onset 2 to 3 days ago of left-sided throat pain and discomfort that is gotten worse over the last day or so only on the left side temperature 99.2 difficulty swallowing tender neck glands in the front no exposures to strep took Tylenol difficulty swallowing choking but able to eat and hydrate.  She is at work Up-to-date on the COVID-vaccine had [employer] booster 6 days ago. Has history of COVID infection with infusion also. Her RA medicine was held recently because of other factors was on an antibiotic for a foot problem a while back.  No exposures   Is at work site  Isolated and no info about exposures to covid   ROS: See pertinent positives and negatives per HPI.  Past Medical History:  Diagnosis Date  . Acute otitis media 08/28/2012   improved  change to liquid medication   . Anxiety   . Breast abscess of female    Recurrent  . Depression   . Family history of adverse reaction to anesthesia    father hard time waking once (12/20/2016)  . Fibroids    w/bleeding  . GERD (gastroesophageal reflux disease)   . History of blood transfusion    after surgery  . Hypertension   . Migraines    Dr. Orie Rout; "I have a few/month" (12/20/2016)  . Osteoarthritis   . Pill esophagitis 08/28/2012   amoxicillin  by hx  disc plan nl voice except hoarse not  drooling  close fu  if not getting better with plan stop aleve  liquid ibu onlyf necessary   . Recurrent periodic urticaria   . Rheumatoid arthritis ([place])    "55% of my body" (12/20/2016)  . Sleep apnea    wears cpap    Past Surgical History:  Procedure Laterality Date  . BREAST SURGERY     abcess under left breast   . CYST EXCISION Left 2014   "elbow"  . HERNIA REPAIR    . INCISION AND DRAINAGE BREAST ABSCESS Left 2003  . INCISIONAL HERNIA REPAIR N/A 12/18/2016   Procedure: REPAIR INCISIONAL HERNIA WITH MESH;  Surgeon: Georganna Skeans, MD;  Location: [place];  Service: General;  Laterality: N/A;  . INSERTION OF MESH N/A 12/18/2016   Procedure: INSERTION OF MESH;  Surgeon: Georganna Skeans, MD;  Location: [place];  Service: General;  Laterality: N/A;  . KNEE ARTHROSCOPY WITH MENISCAL REPAIR Right 06/17/2018   Procedure: RIGHT KNEE ARTHROSCOPY WITH MENISCAL REPAIR;  Surgeon: Vickey Huger, MD;  [facility];  Service: Orthopedics;  Laterality: Right;  . MYOMECTOMY N/A 09/06/2016   Procedure: ABOMINAL MYOMECTOMY;  Surgeon: Governor Specking, MD;  Location: [facility];  Service: Gynecology;  Laterality: N/A;  . UTERINE FIBROID SURGERY     35 fibroids    Family History  Problem Relation Age of Onset  . Hypertension Mother   . Rheum arthritis Mother   .  Hypertension Father   . Sleep apnea Father     Social History   Tobacco Use  . Smoking status: Never Smoker  . Smokeless tobacco: Never Used  Vaping Use  . Vaping Use: Never used  Substance Use Topics  . Alcohol use: Yes    Comment: social  . Drug use: Never      Current Outpatient Medications:  .  albuterol (VENTOLIN HFA) 108 (90 Base) MCG/ACT inhaler, TAKE 2 PUFFS BY MOUTH EVERY 6 HOURS AS NEEDED FOR WHEEZE OR SHORTNESS OF BREATH, Disp: 18 g, Rfl: 1 .  amLODipine (NORVASC) 5 MG tablet, TAKE 1 TABLET BY MOUTH EVERY DAY, Disp: 90 tablet, Rfl: 1 .  amoxicillin-clavulanate (AUGMENTIN) 600-42.9 MG/5ML suspension, Take 5 mLs (600 mg  total) by mouth 2 (two) times daily., Disp: 200 mL, Rfl: 0 .  CIMZIA 2 X 200 MG KIT, INJECT 400 MG UNDER THE SKIN EVERY 28 (TWENTY-EIGHT) DAYS, Disp: 1 each, Rfl: 2 .  cyclobenzaprine (FLEXERIL) 10 MG tablet, TAKE 1 TABLET BY MOUTH EVERYDAY AT BEDTIME, Disp: 20 tablet, Rfl: 0 .  diclofenac sodium (VOLTAREN) 1 % GEL, APPLY 4 G TOPICALLY 2 (TWO) TIMES DAILY., Disp: 400 g, Rfl: 3 .  ELDERBERRY PO, Take by mouth daily., Disp: , Rfl:  .  eletriptan (RELPAX) 40 MG tablet, Take 1 tablet (40 mg total) by mouth as needed for migraine or headache. May repeat in 2 hours if headache persists or recurs., Disp: 10 tablet, Rfl: 1 .  fluocinonide ointment (LIDEX) 5.97 %, Apply 1 application topically 2 (two) times daily. , Disp: , Rfl: 5 .  Levonorgestrel (KYLEENA) 19.5 MG IUD, Kyleena 17.5 mcg/24 hrs (80yr) 19.564mintrauterine device  Take 1 device by intrauterine route., Disp: , Rfl:  .  Melatonin 10 MG CAPS, Take 20 mg by mouth at bedtime as needed (for sleep)., Disp: , Rfl:  .  meloxicam (MOBIC) 15 MG tablet, Take 15 mg by mouth daily., Disp: , Rfl:  .  methotrexate (RHEUMATREX) 2.5 MG tablet, methotrexate sodium 2.5 mg tablet  TAKE 6 TABLETS (15 MG TOTAL) BY MOUTH ONCE A WEEK. (PROTECT FROM LIGHT), Disp: , Rfl:  .  Methotrexate, PF, (RASUVO) 20 MG/0.4ML SOAJ, Inject 20 mg into the skin once a week., Disp: 12 pen, Rfl: 0 .  SUMAtriptan (TOSYMRA) 10 MG/ACT SOLN, Place 1 spray into the nose every hour as needed (Maximum 3 sprays in 24 hours.)., Disp: 6 each, Rfl: 11  EXAM: BP Readings from Last 3 Encounters:  09/30/20 (!) 144/93  08/06/20 (!) 174/101  07/09/20 138/90    VITALS per patient if applicable:  GENERAL: alert, oriented, appears well and in no acute distress  HEENT: atraumatic, conjunttiva clear, no obvious abnormalities on inspection of external nose and ears difficult to see but airway patent left anterior neck +1 adenopathy and tender.  Speech appears normal slightly raspy  NECK: normal  movements of the head and neck  LUNGS: on inspection no signs of respiratory distress, breathing rate appears normal, no obvious gross SOB, gasping or wheezing  CV: no obvious cyanosis  PSYCH/NEURO: pleasant and cooperative, no obvious depression or anxiety, speech and thought processing grossly intact Lab Results  Component Value Date   WBC 4.7 06/10/2020   HGB 13.6 06/10/2020   HCT 41.2 06/10/2020   PLT 315 06/10/2020   GLUCOSE 89 07/02/2020   CHOL 175 07/02/2020   TRIG 52 07/02/2020   HDL 75 07/02/2020   LDLCALC 87 07/02/2020   ALT 9 06/10/2020  AST 14 06/10/2020   NA 141 07/02/2020   K 4.3 07/02/2020   CL 107 07/02/2020   CREATININE 0.95 07/02/2020   BUN 18 07/02/2020   CO2 25 07/02/2020   TSH 1.46 07/02/2020   INR 1.20 09/07/2016   HGBA1C 5.5 07/02/2020    ASSESSMENT AND PLAN:  Discussed the following assessment and plan:    ICD-10-CM   1. Pharyngitis, unspecified etiologyacute unilateral  J02.9    With unilateral no associated symptoms and tender left adenopathy cervical suspect bacterial risk higher  2. Immunocompromised ([place]) medications   D84.9   3. High risk medication use  Z79.899   4. Rheumatoid arthritis involving multiple sites with positive rheumatoid factor (HCC)  M05.79    Past history of COVID immunosuppressive high risk higher risk medicines Unilateral pharyngitis tonsillitis although exam today is difficult she does have left small adenopathy. Empiric treatment with antibiotics she requests liquids because it is easier to swallow Get tested for COVID even though she has been immunized and had the booster 3 days before onset of symptoms.  Omicron variant is a common presentation of severe sore throat. Expectant management gargles hydration and go from there.  Seek care if emergent symptoms develop more difficulty swallowing etc. Counseled.   Expectant management and discussion of plan and treatment with opportunity to ask questions and all were  answered. The patient agreed with the plan and demonstrated an understanding of the instructions.   Advised to call back or seek an in-person evaluation if worsening  or having  further concerns . Return if symptoms worsen or fail to improve as expected. I   Shanon Ace, MD

## 2020-09-30 NOTE — Progress Notes (Signed)
Office Visit Note  Patient: Jennifer [name]             Date of Birth: 1976-06-15           MRN: [id number]             PCP: Burnis Medin, MD Referring: Burnis Medin, MD Visit Date: 10/14/2020 Occupation: @GUAROCC @  Subjective:  Numbness in both hands   History of Present Illness:  SPILDE is a 45 y.o. female with history of seropositive rheumatoid arthritis and osteoarthritis.  She is prescribed cimzia 400 mg sq injections every 28 days, rasuvo 20 mg sq injections once weekly, and folic acid 2 mg po daily but she has been holding both medications for the past 2 months.  Her last cimzia injection was on 09/22/20.  She is currently taking augmentin prescribed by Dr. Regis Bill for a throat infection.  She plans on calling us to schedule her next cimzia injection once she has completed the antibiotics. She is experiencing pain in both hands and both wrist joints but denies joint swelling. She reports she has noticed numbness in both hands intermittently at night but denies any daytime paresthesias.  She continues to have chronic severe pain in the right knee joint.  She has been using a cane to assist with ambulation. She states she was going to [facility] weight management clinic and has since switched to [facility] weight management.  She states she has to lose 20 more lbs prior to scheduling a knee replacement with Dr. Wynelle Link.  She is hoping to have the right knee replaced in April 2022.    [name]     Activities of Daily Living:  Patient reports morning stiffness for 6 hours.   Patient Reports nocturnal pain.  Difficulty dressing/grooming: Denies Difficulty climbing stairs: Reports Difficulty getting out of chair: Reports Difficulty using hands for taps, buttons, cutlery, and/or writing: Reports  Review of Systems  Constitutional: Positive for fatigue.  HENT: Negative for mouth dryness.   Eyes: Negative for dryness.  Respiratory: Negative for shortness of breath.   Cardiovascular: Positive for  swelling in legs/feet.  Gastrointestinal: Negative for constipation.  Endocrine: Positive for heat intolerance.  Genitourinary: Negative for difficulty urinating.  Musculoskeletal: Positive for arthralgias, gait problem, joint pain, joint swelling, muscle weakness and morning stiffness.  Skin: Negative for rash.  Allergic/Immunologic: Negative for susceptible to infections.  Neurological: Positive for numbness.  Hematological: Negative for bruising/bleeding tendency.  Psychiatric/Behavioral: Positive for sleep disturbance.    PMFS History:  Patient Active Problem List   Diagnosis Date Noted  . Osteoarthritis of knee 04/02/2020  . Pain in right knee 01/13/2020  . Chronic pain of right knee 01/21/2019  . S/P right knee arthroscopy 07/23/2018  . 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  . Primary osteoarthritis of both hands 02/04/2018  . Sleep apnea, obstructive 09/28/2017  . Primary osteoarthritis of both knees 05/22/2017  . Incisional hernia 12/18/2016  . Leiomyoma of uterus 09/06/2016  . Fibroid, uterine   . Essential hypertension 05/25/2015  . Recurrent headache 05/25/2015  . Head lump 07/31/2014  . Edema 12/29/2013  . Baker's cyst, ruptured 12/25/2013  . Cough, persistent 12/12/2013  . Left leg swelling 12/12/2013  . Cough 12/12/2013  . High risk medication use 12/12/2013  . Recurrent periodic urticaria   . Rheumatoid arthritis ([place]) 08/28/2012  . CHRONIC LARYNGITIS 11/03/2010  . ALLERGIC RHINITIS 11/03/2010  . PARESTHESIA 07/28/2010  . DYSMENORRHEA 10/15/2007  . FIBROIDS, UTERUS 07/24/2007  . OBESITY 07/24/2007  .  SLEEPLESSNESS 07/24/2007  . HEADACHE 07/24/2007    Past Medical History:  Diagnosis Date  . Acute otitis media 08/28/2012   improved  change to liquid medication   . Anxiety   . Breast abscess of female    Recurrent  . Depression   . Family history of adverse reaction to anesthesia    father hard time waking once (12/20/2016)  . Fibroids    w/bleeding  . GERD  (gastroesophageal reflux disease)   . History of blood transfusion    after surgery  . Hypertension   . Migraines    Dr. Orie Rout; "I have a few/month" (12/20/2016)  . Osteoarthritis   . Pill esophagitis 08/28/2012   amoxicillin  by hx  disc plan nl voice except hoarse not drooling  close fu  if not getting better with plan stop aleve  liquid ibu onlyf necessary   . Recurrent periodic urticaria   . Rheumatoid arthritis ([place])    "55% of my body" (12/20/2016)  . Sleep apnea    wears cpap    Family History  Problem Relation Age of Onset  . Hypertension Mother   . Rheum arthritis Mother   . Hypertension Father   . Sleep apnea Father    Past Surgical History:  Procedure Laterality Date  . BREAST SURGERY     abcess under left breast   . CYST EXCISION Left 2014   "elbow"  . HERNIA REPAIR    . INCISION AND DRAINAGE BREAST ABSCESS Left 2003  . INCISIONAL HERNIA REPAIR N/A 12/18/2016   Procedure: REPAIR INCISIONAL HERNIA WITH MESH;  Surgeon: Georganna Skeans, MD;  Location: [place];  Service: General;  Laterality: N/A;  . INSERTION OF MESH N/A 12/18/2016   Procedure: INSERTION OF MESH;  Surgeon: Georganna Skeans, MD;  Location: [place];  Service: General;  Laterality: N/A;  . KNEE ARTHROSCOPY WITH MENISCAL REPAIR Right 06/17/2018   Procedure: RIGHT KNEE ARTHROSCOPY WITH MENISCAL REPAIR;  Surgeon: Vickey Huger, MD;  Location[facility];  Service: Orthopedics;  Laterality: Right;  . MYOMECTOMY N/A 09/06/2016   Procedure: ABOMINAL MYOMECTOMY;  Surgeon: Governor Specking, MD;  Location: [facility];  Service: Gynecology;  Laterality: N/A;  . UTERINE FIBROID SURGERY     35 fibroids   Social History   Social History Narrative  . Not on file   Immunization History  Administered Date(s) Administered  . Influenza Split 08/21/2012  . Influenza,inj,Quad PF,6+ Mos 07/31/2014, 08/19/2019  . Pneumococcal Conjugate-13 08/28/2012     Objective: Vital Signs: BP (!) 155/94 (BP Location: Left Arm, Patient  Position: Sitting, Cuff Size: Large)   Pulse 78   Resp 16   Ht 5' 4"  (1.626 m)   Wt 261 lb (118.4 kg)   BMI 44.80 kg/m    Physical Exam Vitals and nursing note reviewed.  Constitutional:      Appearance: She is well-developed and well-nourished.  HENT:     Head: Normocephalic and atraumatic.  Eyes:     Extraocular Movements: EOM normal.     Conjunctiva/sclera: Conjunctivae normal.  Cardiovascular:     Pulses: Intact distal pulses.  Pulmonary:     Effort: Pulmonary effort is normal.  Abdominal:     Palpations: Abdomen is soft.  Musculoskeletal:     Cervical back: Normal range of motion.  Skin:    General: Skin is warm and dry.     Capillary Refill: Capillary refill takes less than 2 seconds.  Neurological:     Mental Status: She is  alert and oriented to person, place, and time.  Psychiatric:        Mood and Affect: Mood and affect normal.        Behavior: Behavior normal.      Musculoskeletal Exam: C-spine, thoracic spine, and lumbar spine good ROM.  Shoulder joints, elbow joints, wrist joints, MCPs, PIPs, and DIPs good ROM with no synovitis.  Complete fist formation bilaterally.  Mild tenderness over the ulnar aspect of the right wrist. Right knee painful and limited flexion and extension.  Left knee has good ROM with no warmth or effusion.  Ankle joints good ROM with no tenderness or no inflammation.   CDAI Exam: CDAI Score: 2.6  Patient Global: 3 mm; Provider Global: 3 mm Swollen: 1 ; Tender: 1  Joint Exam 10/14/2020      Right  Left  Knee  Swollen Tender        Investigation: No additional findings.  Imaging: No results found.  Recent Labs: Lab Results  Component Value Date   WBC 4.7 06/10/2020   HGB 13.6 06/10/2020   PLT 315 06/10/2020   NA 141 07/02/2020   K 4.3 07/02/2020   CL 107 07/02/2020   CO2 25 07/02/2020   GLUCOSE 89 07/02/2020   BUN 18 07/02/2020   CREATININE 0.95 07/02/2020   BILITOT 0.6 06/10/2020   ALKPHOS 69 06/11/2018   AST 14  06/10/2020   ALT 9 06/10/2020   PROT 8.0 06/10/2020   ALBUMIN 3.8 06/11/2018   CALCIUM 9.1 07/02/2020   GFRAA 84 07/02/2020   QFTBGOLDPLUS NEGATIVE 02/18/2020    Speciality Comments: Prior therapy: Plaquenil (inadequate response)  Procedures:  No procedures performed Allergies: Lisinopril and Lisinopril-hydrochlorothiazide   Assessment / Plan:     Visit Diagnoses: Rheumatoid arthritis involving multiple sites with positive rheumatoid factor (HCC) - +CCP, +HLA B27: She presents today with ongoing pain in her right knee joint due to severe osteoarthritis.  She has painful and limited flexion and extension of the right knee with warmth on examination today.  She has established care at [facility] weight loss management clinic and needs to lose 20 more pounds prior to scheduling the right knee total arthroplasty with Dr. Wynelle Link.  She is hoping to schedule surgery in April 2022. She has not had any recent rheumatoid arthritis flares. She has occasional discomfort in both wrist joints, R>L but no inflammation was noted.  Her last dose of in office Cimzia was on 08/06/2020.she has been holding received over the past 2 months.  She is currently on a prescription of Augmentin prescribed by Dr. Regis Bill for acute tonsillitis.  She plans on scheduling her Cimzia in office injection and resuming Rasuvo once she has completed the course of Augmentin since her symptoms have completely resolved.  She will then continue on the current treatment regimen.  She does not need any refills at this time.  We discussed that prior to her right knee total arthroplasty she will need to hold Cimzia 1 month prior to and Rasuvo 2 weeks prior to surgery.  She will need to be cleared by Dr. Wynelle Link prior to resuming Cimzia and receiving injections during the postoperative period. Advised to notify us if she develops increased joint pain or joint swelling.  She will follow up in 5 months.   High risk medication use - Cimzia 400  mg sq injections in the office every 28 days, Rasuvo 20 mg sq injections once weekly, and folic acid 1 mg 2 tablets daily.  CBC  and CMP updated on 09/22/20.  Her next lab work will be due in April and every 3 months to monitor for drug toxicity.  Standing orders for CBC and CMP are in place. TB gold negative on 02/18/20 and will be monitored yearly.   She is currently taking augementin for a recent throat infection.  Her symptoms have completely resolved. She plans on scheduling her next cimzia injection once she completes the course of augmentin.   Discussed the importance of holding cimzia and Rasuvo if she develops signs or symptoms of an infection and to resume once the infection has completely cleared. Discussed that she will need to hold cimzia 1 month prior to her right total knee arthoplasty and rasuvo 2 weeks prior to surgery.  She will require clearance by Dr. Wynelle Link prior to resuming these medications during the postoperative period.   Primary osteoarthritis of both hands: She has no joint tenderness or inflammation on exam. She is able to make a complete fist bilaterally. Discussed the importance of joint protection and muscle strengthening.   Primary osteoarthritis of both knees: She has chronic pain in the right knee joint.  Painful and limited flexion and extension of the right knee was noted on exam.  She was warmth but no effusion of the right knee.  She has been using a cane to assist with ambulation.  She has established care at [facility] weight management clinic and needs to lose 20 more lbs in order to qualify for a right knee replacement.  She is hoping to schedule a right knee total arthroplasty with Dr. Wynelle Link in [place] 2022.   S/P right knee arthroscopy: Chronic pain. She has painful and limited flexion and extension on exam.  She is hoping to have her right knee replaced by Dr. Wynelle Link in April 2022.   Paresthesia of both hands: She has been experiencing intermittent paresthesias in both  hands at night for the past several weeks.  She has not been peforming any overuse activities and has not had any daytime symptoms.  She has been using a cane to assist with ambulation and typically alternates hands.  She has good ROM of both wrist joints with no tenderness or synovitis.  Discussed the use of carpal tunnel night splints.  Discussed that if her symptoms persist or worsen we will refer her to a NCV with EMG for further evaluation.   Essential hypertension: BP was 155/94 today in the office.   Orders: Orders Placed This Encounter  Procedures  . COMPLETE METABOLIC PANEL WITH GFR   No orders of the defined types were placed in this encounter.     Follow-Up Instructions: Return in about 5 months (around 03/14/2021) for Rheumatoid arthritis, Osteoarthritis.   Ofilia Neas, PA-C  Note - This record has been created using Dragon software.  Chart creation errors have been sought, but may not always  have been located. Such creation errors do not reflect on  the standard of medical care.

## 2020-09-30 NOTE — Telephone Encounter (Signed)
Spoke with patient regarding Cimzia injection. Patient put December dose on hold due to possible surgery. Patient states she is currently about to start antibiotics for possible strep throat. Patient is due for an office visit and labs. Patient has been scheduled for 10/14/2020 for an appointment. Patient advised to call the office if she is still not feeling well. Patient advised next Cimzia will be determined by Hazel Sams, PA-C at her office visit.

## 2020-10-01 ENCOUNTER — Ambulatory Visit
Admission: RE | Admit: 2020-10-01 | Discharge: 2020-10-01 | Disposition: A | Discharge status: Home / Self Care | Payer: 59 | Source: Ambulatory Visit | Attending: Emergency Medicine | Admitting: Emergency Medicine

## 2020-10-01 ENCOUNTER — Other Ambulatory Visit: Payer: Self-pay

## 2020-10-01 VITALS — BP 168/107 | HR 84 | Temp 98.8°F | Resp 18

## 2020-10-01 DIAGNOSIS — Z79899 Other long term (current) drug therapy: Secondary | ICD-10-CM | POA: Insufficient documentation

## 2020-10-01 DIAGNOSIS — J039 Acute tonsillitis, unspecified: Secondary | ICD-10-CM | POA: Insufficient documentation

## 2020-10-01 DIAGNOSIS — Z113 Encounter for screening for infections with a predominantly sexual mode of transmission: Secondary | ICD-10-CM | POA: Diagnosis present

## 2020-10-01 MED ORDER — DEXAMETHASONE 10 MG/ML FOR PEDIATRIC ORAL USE
10.0000 mg | Freq: Once | INTRAMUSCULAR | Status: AC
  Administered 2020-10-01: 10 mg via ORAL

## 2020-10-01 NOTE — ED Provider Notes (Signed)
[name]    CSN: [id number] Arrival date & time: 10/01/20  1753      History   Chief Complaint Chief Complaint  Patient presents with  . SEXUALLY TRANSMITTED DISEASE    HPI Jennifer [name] is a 45 y.o. female  With history as below presenting for STI testing.  Currently sexual active with 1 female partner.  States that her partner is having dysuria.  No known STI exposure.  Last coitus in December.  Denying symptoms at this time. Patient also wanting her throat examined.  States she had a virtual visit yesterday, for which she was prescribed Augmentin.  Has taken 3 doses and tolerating well.  No strep contacts, difficulty breathing.  Past Medical History:  Diagnosis Date  . Acute otitis media 08/28/2012   improved  change to liquid medication   . Anxiety   . Breast abscess of female    Recurrent  . Depression   . Family history of adverse reaction to anesthesia    father hard time waking once (12/20/2016)  . Fibroids    w/bleeding  . GERD (gastroesophageal reflux disease)   . History of blood transfusion    after surgery  . Hypertension   . Migraines    Dr. Orie Rout; "I have a few/month" (12/20/2016)  . Osteoarthritis   . Pill esophagitis 08/28/2012   amoxicillin  by hx  disc plan nl voice except hoarse not drooling  close fu  if not getting better with plan stop aleve  liquid ibu onlyf necessary   . Recurrent periodic urticaria   . Rheumatoid arthritis ([place])    "55% of my body" (12/20/2016)  . Sleep apnea    wears cpap    Patient Active Problem List   Diagnosis Date Noted  . Osteoarthritis of knee 04/02/2020  . Pain in right knee 01/13/2020  . Chronic pain of right knee 01/21/2019  . S/P right knee arthroscopy 07/23/2018  . 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  . Primary osteoarthritis of both hands 02/04/2018  . Sleep apnea, obstructive 09/28/2017  . Primary osteoarthritis of both knees 05/22/2017  . Incisional hernia 12/18/2016  . Leiomyoma of  uterus 09/06/2016  . Fibroid, uterine   . Essential hypertension 05/25/2015  . Recurrent headache 05/25/2015  . Head lump 07/31/2014  . Edema 12/29/2013  . Baker's cyst, ruptured 12/25/2013  . Cough, persistent 12/12/2013  . Left leg swelling 12/12/2013  . Cough 12/12/2013  . High risk medication use 12/12/2013  . Recurrent periodic urticaria   . Rheumatoid arthritis ([place]) 08/28/2012  . CHRONIC LARYNGITIS 11/03/2010  . ALLERGIC RHINITIS 11/03/2010  . PARESTHESIA 07/28/2010  . DYSMENORRHEA 10/15/2007  . FIBROIDS, UTERUS 07/24/2007  . OBESITY 07/24/2007  . SLEEPLESSNESS 07/24/2007  . HEADACHE 07/24/2007    Past Surgical History:  Procedure Laterality Date  . BREAST SURGERY     abcess under left breast   . CYST EXCISION Left 2014   "elbow"  . HERNIA REPAIR    . INCISION AND DRAINAGE BREAST ABSCESS Left 2003  . INCISIONAL HERNIA REPAIR N/A 12/18/2016   Procedure: REPAIR INCISIONAL HERNIA WITH MESH;  Surgeon: Georganna Skeans, MD;  Location: [place];  Service: General;  Laterality: N/A;  . INSERTION OF MESH N/A 12/18/2016   Procedure: INSERTION OF MESH;  Surgeon: Georganna Skeans, MD;  Location: [place];  Service: General;  Laterality: N/A;  . KNEE ARTHROSCOPY WITH MENISCAL REPAIR Right 06/17/2018   Procedure: RIGHT KNEE ARTHROSCOPY WITH MENISCAL REPAIR;  Surgeon: Vickey Huger, MD;  [facility];  Service: Orthopedics;  Laterality: Right;  . MYOMECTOMY N/A 09/06/2016   Procedure: ABOMINAL MYOMECTOMY;  Surgeon: Governor Specking, MD;  Location: [facility];  Service: Gynecology;  Laterality: N/A;  . UTERINE FIBROID SURGERY     35 fibroids    OB History    Gravida  1   Para  1   Term      Preterm      AB      Living        SAB      IAB      Ectopic      Multiple      Live Births               Home Medications    Prior to Admission medications   Medication Sig Start Date End Date Taking? Authorizing Provider  albuterol (VENTOLIN HFA) 108 (90 Base) MCG/ACT inhaler  TAKE 2 PUFFS BY MOUTH EVERY 6 HOURS AS NEEDED FOR WHEEZE OR SHORTNESS OF BREATH 04/12/20   Panosh, Standley Brooking, MD  amLODipine (NORVASC) 5 MG tablet TAKE 1 TABLET BY MOUTH EVERY DAY 02/24/19   Panosh, Standley Brooking, MD  amoxicillin-clavulanate (AUGMENTIN) 600-42.9 MG/5ML suspension Take 5 mLs (600 mg total) by mouth 2 (two) times daily. 09/30/20   Panosh, Standley Brooking, MD  CIMZIA 2 X 200 MG KIT INJECT 400 MG UNDER THE SKIN EVERY 28 (TWENTY-EIGHT) DAYS 07/08/18   Bo Merino, MD  cyclobenzaprine (FLEXERIL) 10 MG tablet TAKE 1 TABLET BY MOUTH EVERYDAY AT BEDTIME 10/17/19   Panosh, Standley Brooking, MD  diclofenac sodium (VOLTAREN) 1 % GEL APPLY 4 G TOPICALLY 2 (TWO) TIMES DAILY. 06/23/19   Bo Merino, MD  ELDERBERRY PO Take by mouth daily.    [provider]  eletriptan (RELPAX) 40 MG tablet Take 1 tablet (40 mg total) by mouth as needed for migraine or headache. May repeat in 2 hours if headache persists or recurs. 10/02/19   Panosh, Standley Brooking, MD  fluocinonide ointment (LIDEX) 9.67 % Apply 1 application topically 2 (two) times daily.  12/03/17   [provider]  Levonorgestrel (KYLEENA) 19.5 MG IUD Kyleena 17.5 mcg/24 hrs (15yr) 19.549mintrauterine device  Take 1 device by intrauterine route.    [provider]  Melatonin 10 MG CAPS Take 20 mg by mouth at bedtime as needed (for sleep).    [provider]  meloxicam (MOBIC) 15 MG tablet Take 15 mg by mouth daily. 08/06/20   [provider]  methotrexate (RHEUMATREX) 2.5 MG tablet methotrexate sodium 2.5 mg tablet  TAKE 6 TABLETS (15 MG TOTAL) BY MOUTH ONCE A WEEK. (PROTECT FROM LIGHT)    [provider]  Methotrexate, PF, (RASUVO) 20 MG/0.4ML SOAJ Inject 20 mg into the skin once a week. 02/18/20   Yopp, Amber C, RPH-CPP  SUMAtriptan (TOSYMRA) 10 MG/ACT SOLN Place 1 spray into the nose every hour as needed (Maximum 3 sprays in 24 hours.). 11/10/19   JaPieter PartridgeDO    Family History Family History  Problem Relation  Age of Onset  . Hypertension Mother   . Rheum arthritis Mother   . Hypertension Father   . Sleep apnea Father     Social History Social History   Tobacco Use  . Smoking status: Never Smoker  . Smokeless tobacco: Never Used  Vaping Use  . Vaping Use: Never used  Substance Use Topics  . Alcohol use: Yes    Comment: social  . Drug use:  Never     Allergies   Lisinopril and Lisinopril-hydrochlorothiazide   Review of Systems Review of Systems  Constitutional: Negative for fatigue and fever.  HENT: Positive for sore throat. Negative for congestion, dental problem, ear pain, facial swelling, hearing loss, sinus pain, trouble swallowing and voice change.   Eyes: Negative for photophobia, pain and visual disturbance.  Respiratory: Negative for cough and shortness of breath.   Cardiovascular: Negative for chest pain and palpitations.  Gastrointestinal: Negative for constipation, diarrhea and vomiting.  Genitourinary: Negative for dysuria, flank pain, frequency, hematuria, pelvic pain, urgency, vaginal bleeding, vaginal discharge and vaginal pain.  Musculoskeletal: Negative for arthralgias and myalgias.  Neurological: Negative for dizziness and headaches.     Physical Exam Triage Vital Signs ED Triage Vitals [10/01/20 1828]  Enc Vitals Group     BP (!) 168/107     Pulse Rate 84     Resp 18     Temp 98.8 F (37.1 C)     Temp Source Oral     SpO2 95 %     Weight      Height      Head Circumference      Peak Flow      Pain Score      Pain Loc      Pain Edu?      Excl. in [place]?    No data found.  Updated Vital Signs BP (!) 168/107   Pulse 84   Temp 98.8 F (37.1 C) (Oral)   Resp 18   SpO2 95%   Visual Acuity Right Eye Distance:   Left Eye Distance:   Bilateral Distance:    Right Eye Near:   Left Eye Near:    Bilateral Near:     Physical Exam Constitutional:      General: She is not in acute distress. HENT:     Head: Normocephalic and atraumatic.      Mouth/Throat:     Mouth: Mucous membranes are moist.     Pharynx: Oropharyngeal exudate present. No posterior oropharyngeal erythema.     Comments: Left tonsil 3+, right tonsil 1+.  Left tonsil with exudate.  No purulence, malodor.  Uvula midline, nonedematous. Eyes:     General: No scleral icterus.    Pupils: Pupils are equal, round, and reactive to light.  Cardiovascular:     Rate and Rhythm: Normal rate.  Pulmonary:     Effort: Pulmonary effort is normal.  Abdominal:     General: Bowel sounds are normal.     Palpations: Abdomen is soft.     Tenderness: There is no abdominal tenderness. There is no right CVA tenderness, left CVA tenderness or guarding.  Genitourinary:    Comments: Patient declined, self-swab performed Skin:    Coloration: Skin is not jaundiced or pale.  Neurological:     Mental Status: She is alert and oriented to person, place, and time.      UC Treatments / Results  Labs (all labs ordered are listed, but only abnormal results are displayed) Labs Reviewed  CERVICOVAGINAL ANCILLARY ONLY    EKG   Radiology No results found.  Procedures Procedures (including critical care time)  Medications Ordered in UC Medications  dexamethasone (DECADRON) 10 MG/ML injection for Pediatric ORAL use 10 mg (10 mg Oral Given 10/01/20 1845)    Initial Impression / Assessment and Plan / UC Course  I have reviewed the triage vital signs and the nursing notes.  Pertinent labs & imaging results that  were available during my care of the patient were reviewed by me and considered in my medical decision making (see chart for details).     Patient febrile, nontoxic in office today.  Airway is patent.  Patient does have unilateral tonsillitis: We will continue Augmentin, defer strep given she has been on antibiotics greater than 24 hours with unilaterality.  Cytology pending: We will treat for STI if indicated.  Practice abstinence in the interim.  Return precautions  discussed, pt verbalized understanding and is agreeable to plan. Final Clinical Impressions(s) / UC Diagnoses   Final diagnoses:  Acute tonsillitis, unspecified etiology  Screening examination for venereal disease     Discharge Instructions     Complete augmentin as prescribed.  Testing for chlamydia, gonorrhea, trichomonas is pending: please look for these results on the MyChart app/website.  We will notify you if you are positive and outline treatment at that time.  Important to avoid all forms of sexual intercourse (oral, vaginal, anal) with any/all partners for the next 7 days to avoid spreading/reinfecting. Any/all sexual partners should be notified of testing/treatment today.  Return for persistent/worsening symptoms or if you develop fever, abdominal or pelvic pain, discharge, genital pain, blood in your urine, or are re-exposed to an STI.    ED Prescriptions    None     PDMP not reviewed this encounter.   Hall[place], [place], [place] 10/01/20 6720

## 2020-10-01 NOTE — ED Triage Notes (Signed)
Pt requesting STD testing, denies sx's. Pt c/o sore throat states is currently on Augmentin.

## 2020-10-01 NOTE — Discharge Instructions (Signed)
Complete augmentin as prescribed.  Testing for chlamydia, gonorrhea, trichomonas is pending: please look for these results on the MyChart app/website.  We will notify you if you are positive and outline treatment at that time.  Important to avoid all forms of sexual intercourse (oral, vaginal, anal) with any/all partners for the next 7 days to avoid spreading/reinfecting. Any/all sexual partners should be notified of testing/treatment today.  Return for persistent/worsening symptoms or if you develop fever, abdominal or pelvic pain, discharge, genital pain, blood in your urine, or are re-exposed to an STI.

## 2020-10-04 ENCOUNTER — Encounter: Payer: Self-pay | Admitting: Podiatry

## 2020-10-04 ENCOUNTER — Telehealth: Payer: Self-pay | Admitting: Podiatry

## 2020-10-04 LAB — CERVICOVAGINAL ANCILLARY ONLY
Bacterial Vaginitis (gardnerella): NEGATIVE
Candida Glabrata: NEGATIVE
Candida Vaginitis: NEGATIVE
Chlamydia: NEGATIVE
Comment: NEGATIVE
Comment: NEGATIVE
Comment: NEGATIVE
Comment: NEGATIVE
Comment: NEGATIVE
Comment: NORMAL
Neisseria Gonorrhea: NEGATIVE
Trichomonas: NEGATIVE

## 2020-10-04 NOTE — Telephone Encounter (Signed)
Attempted to call patient as a follow up to the MyChart message. Was unable to reach her and voicemail is full.

## 2020-10-05 ENCOUNTER — Telehealth: Payer: Self-pay | Admitting: *Deleted

## 2020-10-05 ENCOUNTER — Telehealth: Payer: Self-pay | Admitting: Podiatry

## 2020-10-05 NOTE — Telephone Encounter (Signed)
Attempted to call patient to follow up from MyChart message and she has not read it yet. I tried to call but went to voicemail and not able to leave a message.

## 2020-10-05 NOTE — Telephone Encounter (Signed)
Called and spoke with Roxie from [facility] and the report is being faxed now at [phone]. Lattie Haw

## 2020-10-07 ENCOUNTER — Other Ambulatory Visit: Payer: Self-pay

## 2020-10-07 ENCOUNTER — Ambulatory Visit (INDEPENDENT_AMBULATORY_CARE_PROVIDER_SITE_OTHER): Payer: 59 | Admitting: Podiatry

## 2020-10-07 DIAGNOSIS — L603 Nail dystrophy: Secondary | ICD-10-CM

## 2020-10-07 DIAGNOSIS — L819 Disorder of pigmentation, unspecified: Secondary | ICD-10-CM

## 2020-10-07 DIAGNOSIS — M79674 Pain in right toe(s): Secondary | ICD-10-CM

## 2020-10-12 NOTE — Progress Notes (Signed)
Subjective: 45 year old female presents the office today for follow-up evaluation after undergoing biopsy of the right second digit toenail.  She is sent a message over the weekend with concerns that it was not looking as good as she hoped.  She presents today for further evaluation.  Denies any drainage or pus or any swelling or any redness.  No red streaks.  Occasionally gets tenderness at the tip of the toe. Denies any systemic complaints such as fevers, chills, nausea, vomiting. No acute changes since last appointment, and no other complaints at this time.   Objective: AAO x3, NAD DP/PT pulses palpable 2/4 bilaterally, CRT less than 3 seconds Status post right second digit toenail removal, biopsy.  Scab is present the distal aspect the toe of the toenails removed.  There is no drainage or pus.  Trace edema to the toe.  There is no erythema or warmth.  On the pictures that she is sent to look at the toe was very dark however clinically the toe was adequate color there is an immediate capillary fill time to the digit.  One small dark spot is present on the proximal nail fold.  She does have multiple flat hyperpigmented lesions on her body that she is seeing dermatology for.  They have all been benign. No pain with calf compression, swelling, warmth, erythema  Assessment: 45 year old female with previous toenail avulsion, biopsy  Plan: -All treatment options discussed with the patient including all alternatives, risks, complications.  -Recommend continue soaking Epson salts and cover with antibiotic ointment and dressing daily.  Continue offloading. -We will monitor the hyperpigmented area and possibly referral to dermatology although it looks benign and similar to other lesions that she has. -Patient encouraged to call the office with any questions, concerns, change in symptoms.   Trula Slade DPM

## 2020-10-14 ENCOUNTER — Encounter: Payer: Self-pay | Admitting: Physician Assistant

## 2020-10-14 ENCOUNTER — Ambulatory Visit (INDEPENDENT_AMBULATORY_CARE_PROVIDER_SITE_OTHER): Payer: 59 | Admitting: Physician Assistant

## 2020-10-14 ENCOUNTER — Other Ambulatory Visit: Payer: Self-pay

## 2020-10-14 VITALS — BP 155/94 | HR 78 | Resp 16 | Ht 64.0 in | Wt 261.0 lb

## 2020-10-14 DIAGNOSIS — M19041 Primary osteoarthritis, right hand: Secondary | ICD-10-CM | POA: Diagnosis not present

## 2020-10-14 DIAGNOSIS — Z79899 Other long term (current) drug therapy: Secondary | ICD-10-CM | POA: Diagnosis not present

## 2020-10-14 DIAGNOSIS — M170 Bilateral primary osteoarthritis of knee: Secondary | ICD-10-CM | POA: Diagnosis not present

## 2020-10-14 DIAGNOSIS — M0579 Rheumatoid arthritis with rheumatoid factor of multiple sites without organ or systems involvement: Secondary | ICD-10-CM | POA: Diagnosis not present

## 2020-10-14 DIAGNOSIS — R202 Paresthesia of skin: Secondary | ICD-10-CM

## 2020-10-14 DIAGNOSIS — I10 Essential (primary) hypertension: Secondary | ICD-10-CM

## 2020-10-14 DIAGNOSIS — M19042 Primary osteoarthritis, left hand: Secondary | ICD-10-CM

## 2020-10-14 DIAGNOSIS — Z9889 Other specified postprocedural states: Secondary | ICD-10-CM

## 2020-10-14 NOTE — Patient Instructions (Signed)
Standing Labs We placed an order today for your standing lab work.   Please have your standing labs drawn in April and every 3 months   If possible, please have your labs drawn 2 weeks prior to your appointment so that the provider can discuss your results at your appointment.  We have open lab daily Monday through Thursday from 8:30-12:30 PM and 1:30-4:30 PM and Friday from 8:30-12:30 PM and 1:30-4:00 PM at the office of Dr. Shaili Deveshwar, [facility].   Please be advised, patients with office appointments requiring lab work will take precedents over walk-in lab work.  If possible, please come for your lab work on Monday and Friday afternoons, as you may experience shorter wait times. The office is located at [address], [address], [place], [place] [address] No appointment is necessary.   Labs are drawn by [facility]. Please bring your co-pay at the time of your lab draw.  You may receive a bill from [facility] for your lab work.  If you wish to have your labs drawn at another location, please call the office 24 hours in advance to send orders.  If you have any questions regarding directions or hours of operation,  please call [phone].   As a reminder, please drink plenty of water prior to coming for your lab work. Thanks!   

## 2020-11-08 ENCOUNTER — Ambulatory Visit (INDEPENDENT_AMBULATORY_CARE_PROVIDER_SITE_OTHER): Payer: 59 | Admitting: *Deleted

## 2020-11-08 ENCOUNTER — Other Ambulatory Visit: Payer: Self-pay

## 2020-11-08 VITALS — BP 151/96 | HR 78

## 2020-11-08 DIAGNOSIS — M0579 Rheumatoid arthritis with rheumatoid factor of multiple sites without organ or systems involvement: Secondary | ICD-10-CM | POA: Diagnosis not present

## 2020-11-08 MED ORDER — CERTOLIZUMAB PEGOL 2 X 200 MG ~~LOC~~ KIT
400.0000 mg | PACK | Freq: Once | SUBCUTANEOUS | Status: AC
  Administered 2020-11-08: 400 mg via SUBCUTANEOUS

## 2020-11-08 NOTE — Progress Notes (Signed)
Pharmacy Note  Subjective:   Patient presents to clinic today to receive monthly dose of Cimzia.  Patient running a fever or have signs/symptoms of infection? No  Patient currently on antibiotics for the treatment of infection? No  Patient have any upcoming invasive procedures/surgeries? No  Objective: CMP     Component Value Date/Time   NA 141 07/02/2020 1106   NA 138 06/11/2018 1212   K 4.3 07/02/2020 1106   CL 107 07/02/2020 1106   CO2 25 07/02/2020 1106   GLUCOSE 89 07/02/2020 1106   BUN 18 07/02/2020 1106   BUN 9 06/11/2018 1212   CREATININE 0.95 07/02/2020 1106   CALCIUM 9.1 07/02/2020 1106   PROT 8.0 06/10/2020 0909   PROT 8.0 06/11/2018 1212   ALBUMIN 3.8 06/11/2018 1212   AST 14 06/10/2020 0909   ALT 9 06/10/2020 0909   ALKPHOS 69 06/11/2018 1212   BILITOT 0.6 06/10/2020 0909   BILITOT 0.3 06/11/2018 1212   GFRNONAA 72 07/02/2020 1106   GFRAA 84 07/02/2020 1106    CBC    Component Value Date/Time   WBC 4.7 06/10/2020 0909   RBC 4.41 06/10/2020 0909   HGB 13.6 06/10/2020 0909   HGB 11.9 06/11/2018 1212   HCT 41.2 06/10/2020 0909   HCT 36.6 06/11/2018 1212   PLT 315 06/10/2020 0909   PLT 310 06/11/2018 1212   MCV 93.4 06/10/2020 0909   MCV 93 06/11/2018 1212   MCH 30.8 06/10/2020 0909   MCHC 33.0 06/10/2020 0909   RDW 12.8 06/10/2020 0909   RDW 14.4 06/11/2018 1212   LYMPHSABS 2,515 06/10/2020 0909   LYMPHSABS 2.3 06/11/2018 1212   MONOABS 928 03/07/2017 1632   EOSABS 108 06/10/2020 0909   EOSABS 0.2 06/11/2018 1212   BASOSABS 28 06/10/2020 0909   BASOSABS 0.0 06/11/2018 1212    Baseline Immunosuppressant Therapy Labs TB GOLD Quantiferon TB Gold Latest Ref Rng & Units 02/18/2020  Quantiferon TB Gold Plus NEGATIVE NEGATIVE   Hepatitis Panel Hepatitis Latest Ref Rng & Units 06/13/2019  Hep B Surface Ag NON-REACTI NON-REACTIVE  Hep B IgM NON-REACTI NON-REACTIVE  Hep C Ab NON-REACTI NON-REACTIVE  Hep C Ab NON-REACTI NON-REACTIVE  Hep A IgM  NON-REACTI NON-REACTIVE   HIV Lab Results  Component Value Date   HIV NON-REACTIVE 02/18/2020   Immunoglobulins Immunoglobulin Electrophoresis Latest Ref Rng & Units 02/18/2020  IgA  47 - 310 mg/dL 651(H)  IgG 600 - 1,640 mg/dL 2,218(H)  IgM 50 - 300 mg/dL 78   SPEP Serum Protein Electrophoresis Latest Ref Rng & Units 06/10/2020  Total Protein 6.1 - 8.1 g/dL 8.0  Albumin 3.8 - 4.8 g/dL -  Alpha-1 0.2 - 0.3 g/dL -  Alpha-2 0.5 - 0.9 g/dL -  Beta Globulin 0.4 - 0.6 g/dL -  Beta 2 0.2 - 0.5 g/dL -  Gamma Globulin 0.8 - 1.7 g/dL -   G6PD No results found for: G6PDH TPMT No results found for: TPMT   Chest x-ray: no acute abnormalities 11/22/2013  Assessment/Plan:   Administrations This Visit    Certolizumab Pegol KIT 400 mg    Admin Date 11/08/2020 Action Given Dose 400 mg Route Subcutaneous Administered By Carole Binning, LPN          Patient tolerated injection well.    Appointment for next injection scheduled for December 06, 2020.  Patient due for labs in April 2022 (January labs in [place]).  Patient is to call and reschedule appointment if running a fever with  signs/symptoms of infection, on antibiotics for active infection or has an upcoming invasive procedure.  All questions encouraged and answered.  Instructed patient to call with any further questions or concerns.

## 2020-11-15 ENCOUNTER — Ambulatory Visit (INDEPENDENT_AMBULATORY_CARE_PROVIDER_SITE_OTHER): Payer: 59 | Admitting: Podiatry

## 2020-11-15 ENCOUNTER — Other Ambulatory Visit: Payer: Self-pay

## 2020-11-15 DIAGNOSIS — L603 Nail dystrophy: Secondary | ICD-10-CM

## 2020-11-15 DIAGNOSIS — L819 Disorder of pigmentation, unspecified: Secondary | ICD-10-CM

## 2020-11-15 MED ORDER — GORDONS UREA 40 % EX OINT: TOPICAL_OINTMENT | CUTANEOUS | 0 refills | Status: DC | PRN

## 2020-11-17 NOTE — Progress Notes (Signed)
Subjective: 45 year old female presents the office today for follow-up evaluation discoloration to her right second toe.  She states that she has several moles all over her body and never had any serious issues.  She has noticed the nail becoming darker as it comes back and.  Denies any pain at this time but gets occasional discomfort.  No redness or drainage or any swelling.  She has no other concerns today.  Objective: AAO x3, NAD DP/PT pulses palpable 2/4 bilaterally, CRT less than 3 seconds Toenail is growing out about halfway the right second toenail.  There is darkened discoloration present to the nail but does not extend into the surrounding skin.  There is no significant pain today there is no edema, erythema, drainage or pus.  Nails hypertrophic, dystrophic.  No pain with calf compression, swelling, warmth, erythema  Assessment: 45 year old female with onychodystrophy  Plan: -All treatment options discussed with the patient including all alternatives, risks, complications.  -Discussed dermatology evaluation.  She states that she has had several moles and never caused an issue.  Recommend monitor this very closely discussed things to look for any worsening of the discoloration of the toenail.  Recommended urea cream.  Trula Slade DPM

## 2020-11-29 ENCOUNTER — Other Ambulatory Visit: Payer: Self-pay

## 2020-11-29 ENCOUNTER — Ambulatory Visit
Admission: RE | Admit: 2020-11-29 | Discharge: 2020-11-29 | Disposition: A | Discharge status: Home / Self Care | Payer: 59 | Source: Ambulatory Visit | Attending: Emergency Medicine | Admitting: Emergency Medicine

## 2020-11-29 VITALS — BP 147/95 | HR 90 | Temp 100.8°F | Resp 20

## 2020-11-29 DIAGNOSIS — J039 Acute tonsillitis, unspecified: Secondary | ICD-10-CM | POA: Insufficient documentation

## 2020-11-29 LAB — POCT RAPID STREP A (OFFICE): Rapid Strep A Screen: NEGATIVE

## 2020-11-29 MED ORDER — AMOXICILLIN-POT CLAVULANATE 600-42.9 MG/5ML PO SUSR: 600.0000 mg | Freq: Two times a day (BID) | ORAL | 0 refills | Status: AC

## 2020-11-29 MED ORDER — ACETAMINOPHEN 325 MG PO TABS
650.0000 mg | ORAL_TABLET | Freq: Once | ORAL | Status: AC
  Administered 2020-11-29: 650 mg via ORAL

## 2020-11-29 MED ORDER — PREDNISOLONE 15 MG/5ML PO SOLN: 30.0000 mg | Freq: Every day | ORAL | 0 refills | Status: AC

## 2020-11-29 NOTE — Discharge Instructions (Addendum)
Strep test negative, oral swab pending to further rule out any other infectious causes of throat Begin Augmentin twice daily for the next 10 days May take Prelone daily for 5 days to help with swelling Supplement with Tylenol/ibuprofen as needed for fevers Warm compresses to neck Follow-up if not improving or worsening

## 2020-11-29 NOTE — ED Triage Notes (Signed)
Patient reports left side of throat is painful.  Patient also has a headache.  Throat pain started Sunday.  Patient reports having a history of this and has been seen at [facility] before for this

## 2020-11-30 NOTE — ED Provider Notes (Signed)
[name]    CSN: [id number] Arrival date & time: 11/29/20  1747      History   Chief Complaint Chief Complaint  Patient presents with  . Sore Throat    HPI Jennifer [name] is a 44 y.o. female history of hypertension, presenting today for evaluation of sore throat.  Patient reports that developed sore throat beginning yesterday.  Pain has progressed quickly and reports increased pain on the left side.  Reports similar approximately 1 to 2 months ago when she was treated with Augmentin.  Having difficulty swallowing pills.  Denies any associated congestion or cough. patient does report being sexually active with ex who has been symptomatic, cannot recall if she had oral sex.  HPI  Past Medical History:  Diagnosis Date  . Acute otitis media 08/28/2012   improved  change to liquid medication   . Anxiety   . Breast abscess of female    Recurrent  . Depression   . Family history of adverse reaction to anesthesia    father hard time waking once (12/20/2016)  . Fibroids    w/bleeding  . GERD (gastroesophageal reflux disease)   . History of blood transfusion    after surgery  . Hypertension   . Migraines    Dr. Marshall Freeman; "I have a few/month" (12/20/2016)  . Osteoarthritis   . Pill esophagitis 08/28/2012   amoxicillin  by hx  disc plan nl voice except hoarse not drooling  close fu  if not getting better with plan stop aleve  liquid ibu onlyf necessary   . Recurrent periodic urticaria   . Rheumatoid arthritis (HCC)    "55% of my body" (12/20/2016)  . Sleep apnea    wears cpap    Patient Active Problem List   Diagnosis Date Noted  . Osteoarthritis of knee 04/02/2020  . Pain in right knee 01/13/2020  . Chronic pain of right knee 01/21/2019  . S/P right knee arthroscopy 07/23/2018  . 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  . Primary osteoarthritis of both hands 02/04/2018  . Sleep apnea, obstructive 09/28/2017  . Primary osteoarthritis of both knees 05/22/2017  .  Incisional hernia 12/18/2016  . Leiomyoma of uterus 09/06/2016  . Fibroid, uterine   . Essential hypertension 05/25/2015  . Recurrent headache 05/25/2015  . Head lump 07/31/2014  . Edema 12/29/2013  . Baker's cyst, ruptured 12/25/2013  . Cough, persistent 12/12/2013  . Left leg swelling 12/12/2013  . Cough 12/12/2013  . High risk medication use 12/12/2013  . Recurrent periodic urticaria   . Rheumatoid arthritis (HCC) 08/28/2012  . CHRONIC LARYNGITIS 11/03/2010  . ALLERGIC RHINITIS 11/03/2010  . PARESTHESIA 07/28/2010  . DYSMENORRHEA 10/15/2007  . FIBROIDS, UTERUS 07/24/2007  . OBESITY 07/24/2007  . SLEEPLESSNESS 07/24/2007  . HEADACHE 07/24/2007    Past Surgical History:  Procedure Laterality Date  . BREAST SURGERY     abcess under left breast   . CYST EXCISION Left 2014   "elbow"  . HERNIA REPAIR    . INCISION AND DRAINAGE BREAST ABSCESS Left 2003  . INCISIONAL HERNIA REPAIR N/A 12/18/2016   Procedure: REPAIR INCISIONAL HERNIA WITH MESH;  Surgeon: Burke Thompson, MD;  Location: [facility] OR;  Service: General;  Laterality: N/A;  . INSERTION OF MESH N/A 12/18/2016   Procedure: INSERTION OF MESH;  Surgeon: Burke Thompson, MD;  Location: [facility] OR;  Service: General;  Laterality: N/A;  . KNEE ARTHROSCOPY WITH MENISCAL REPAIR Right 06/17/2018   Procedure: RIGHT KNEE ARTHROSCOPY WITH MENISCAL REPAIR;    Surgeon: Lucey, Steve, MD;  Location: [facility];  Service: Orthopedics;  Laterality: Right;  . MYOMECTOMY N/A 09/06/2016   Procedure: ABOMINAL MYOMECTOMY;  Surgeon: Tamer Yalcinkaya, MD;  Location: [facility];  Service: Gynecology;  Laterality: N/A;  . UTERINE FIBROID SURGERY     34 fibroids    OB History    Gravida  1   Para  1   Term      Preterm      AB      Living        SAB      IAB      Ectopic      Multiple      Live Births               Home Medications    Prior to Admission medications   Medication Sig Start Date End Date Taking? Authorizing Provider   amLODipine (NORVASC) 5 MG tablet TAKE 1 TABLET BY MOUTH EVERY DAY 02/24/19  Yes Panosh, Wanda K, MD  CIMZIA 2 X 200 MG KIT INJECT 400 MG UNDER THE SKIN EVERY 28 (TWENTY-EIGHT) DAYS 07/08/18  Yes Deveshwar, Shaili, MD  diclofenac sodium (VOLTAREN) 1 % GEL APPLY 4 G TOPICALLY 2 (TWO) TIMES DAILY. 06/23/19  Yes Deveshwar, Shaili, MD  ELDERBERRY PO Take by mouth daily.   Yes [provider]  Levonorgestrel (KYLEENA) 19.5 MG IUD Kyleena 17.5 mcg/24 hrs (5yrs) 19.5mg intrauterine device  Take 1 device by intrauterine route.   Yes [provider]  Melatonin 10 MG CAPS Take 20 mg by mouth at bedtime as needed (for sleep).   Yes [provider]  prednisoLONE (PRELONE) 15 MG/5ML SOLN Take 10 mLs (30 mg total) by mouth daily before breakfast for 5 days. 11/29/20 12/04/20 Yes ,  C, PA-C  SUMAtriptan (TOSYMRA) 10 MG/ACT SOLN Place 1 spray into the nose every hour as needed (Maximum 3 sprays in 24 hours.). 11/10/19  Yes Jaffe, Adam R, DO  amoxicillin-clavulanate (AUGMENTIN) 600-42.9 MG/5ML suspension Take 5 mLs (600 mg total) by mouth 2 (two) times daily for 10 days. 11/29/20 12/09/20  ,  C, PA-C  cyclobenzaprine (FLEXERIL) 10 MG tablet TAKE 1 TABLET BY MOUTH EVERYDAY AT BEDTIME 10/17/19   Panosh, Wanda K, MD  fluocinonide ointment (LIDEX) 0.05 % Apply 1 application topically 2 (two) times daily.  12/03/17   [provider]  meloxicam (MOBIC) 15 MG tablet Take 15 mg by mouth daily. 08/06/20   [provider]  Methotrexate, PF, (RASUVO) 20 MG/0.4ML SOAJ Inject 20 mg into the skin once a week. Patient not taking: No sig reported 02/18/20   Yopp, Amber C, RPH-CPP  traMADol (ULTRAM) 50 MG tablet tramadol 50 mg tablet    [provider]  urea (GORDONS UREA) 40 % ointment Apply topically as needed. 11/15/20   Wagoner, Matthew R, DPM  eletriptan (RELPAX) 40 MG tablet Take 1 tablet (40 mg total) by mouth as needed for migraine or headache. May repeat in  2 hours if headache persists or recurs. 10/02/19 11/29/20  Panosh, Wanda K, MD    Family History Family History  Problem Relation Age of Onset  . Hypertension Mother   . Rheum arthritis Mother   . Hypertension Father   . Sleep apnea Father     Social History Social History   Tobacco Use  . Smoking status: Never Smoker  . Smokeless tobacco: Never Used  Vaping Use  . Vaping Use: Never used  Substance Use Topics  . Alcohol   use: Yes    Comment: social  . Drug use: Never     Allergies   Lisinopril and Lisinopril-hydrochlorothiazide   Review of Systems Review of Systems  Constitutional: Negative for activity change, appetite change, chills, fatigue and fever.  HENT: Positive for sore throat. Negative for congestion, ear pain, rhinorrhea, sinus pressure and trouble swallowing.   Eyes: Negative for discharge and redness.  Respiratory: Negative for cough, chest tightness and shortness of breath.   Cardiovascular: Negative for chest pain.  Gastrointestinal: Negative for abdominal pain, diarrhea, nausea and vomiting.  Musculoskeletal: Negative for myalgias.  Skin: Negative for rash.  Neurological: Negative for dizziness, light-headedness and headaches.     Physical Exam Triage Vital Signs ED Triage Vitals  Enc Vitals Group     BP 11/29/20 1814 (!) 147/95     Pulse Rate 11/29/20 1814 90     Resp 11/29/20 1814 20     Temp 11/29/20 1814 (!) 100.8 F (38.2 C)     Temp Source 11/29/20 1814 Oral     SpO2 11/29/20 1814 99 %     Weight --      Height --      Head Circumference --      Peak Flow --      Pain Score 11/29/20 1809 10     Pain Loc --      Pain Edu? --      Excl. in GC? --    No data found.  Updated Vital Signs BP (!) 147/95 (BP Location: Left Arm)   Pulse 90   Temp (!) 100.8 F (38.2 C) (Oral)   Resp 20   SpO2 99%   Visual Acuity Right Eye Distance:   Left Eye Distance:   Bilateral Distance:    Right Eye Near:   Left Eye Near:    Bilateral  Near:     Physical Exam Vitals and nursing note reviewed.  Constitutional:      Appearance: She is well-developed.     Comments: No acute distress  HENT:     Head: Normocephalic and atraumatic.     Ears:     Comments: Bilateral ears without tenderness to palpation of external auricle, tragus and mastoid, EAC's without erythema or swelling, TM's with good bony landmarks and cone of light. Non erythematous.     Nose: Nose normal.     Mouth/Throat:     Comments: Bilateral tonsils appear enlarged, mildly erythematous and exudate diffusely bilaterally, no soft palate swelling, uvula midline Eyes:     Conjunctiva/sclera: Conjunctivae normal.  Neck:     Comments: Tonsillar lymphadenopathy most prominent on left Cardiovascular:     Rate and Rhythm: Normal rate.  Pulmonary:     Effort: Pulmonary effort is normal. No respiratory distress.     Comments: Breathing comfortably at rest, CTABL, no wheezing, rales or other adventitious sounds auscultated. Speaking in full sentences Abdominal:     General: There is no distension.  Musculoskeletal:        General: Normal range of motion.     Cervical back: Neck supple.  Skin:    General: Skin is warm and dry.  Neurological:     Mental Status: She is alert and oriented to person, place, and time.      UC Treatments / Results  Labs (all labs ordered are listed, but only abnormal results are displayed) Labs Reviewed  CULTURE, GROUP A STREP ([facility])  POCT RAPID STREP A (OFFICE)  CYTOLOGY, (ORAL, ANAL, URETHRAL) ANCILLARY   ONLY    EKG   Radiology No results found.  Procedures Procedures (including critical care time)  Medications Ordered in UC Medications  acetaminophen (TYLENOL) tablet 650 mg (650 mg Oral Given 11/29/20 1830)    Initial Impression / Assessment and Plan / UC Course  I have reviewed the triage vital signs and the nursing notes.  Pertinent labs & imaging results that were available during my care of the patient  were reviewed by me and considered in my medical decision making (see chart for details).     Strep test negative, screening for STDs as cause of possible recurrent tonsillitis.  Based off appearance and fever opting to go ahead and proceed with antibiotic therapy, providing Prelone to help with swelling, Augmentin to cover bacterial causes.  Continue symptomatic and supportive care as well.  Discussed strict return precautions. Patient verbalized understanding and is agreeable with plan.  Final Clinical Impressions(s) / UC Diagnoses   Final diagnoses:  Acute tonsillitis, unspecified etiology     Discharge Instructions     Strep test negative, oral swab pending to further rule out any other infectious causes of throat Begin Augmentin twice daily for the next 10 days May take Prelone daily for 5 days to help with swelling Supplement with Tylenol/ibuprofen as needed for fevers Warm compresses to neck Follow-up if not improving or worsening    ED Prescriptions    Medication Sig Dispense Auth. Provider   amoxicillin-clavulanate (AUGMENTIN) 600-42.9 MG/5ML suspension Take 5 mLs (600 mg total) by mouth 2 (two) times daily for 10 days. 100 mL Chasidy Janak C, PA-C   prednisoLONE (PRELONE) 15 MG/5ML SOLN Take 10 mLs (30 mg total) by mouth daily before breakfast for 5 days. 50 mL Kenidee Cregan, [place] C, PA-C     PDMP not reviewed this encounter.   Janith Lima, [place] 11/30/20 [phone]

## 2020-12-01 LAB — CYTOLOGY, (ORAL, ANAL, URETHRAL) ANCILLARY ONLY
Chlamydia: NEGATIVE
Comment: NEGATIVE
Comment: NEGATIVE
Comment: NORMAL
Neisseria Gonorrhea: NEGATIVE
Trichomonas: NEGATIVE

## 2020-12-03 ENCOUNTER — Telehealth: Payer: Self-pay

## 2020-12-03 LAB — CULTURE, GROUP A STREP (THRC)

## 2020-12-03 NOTE — Telephone Encounter (Signed)
Noted  

## 2020-12-03 NOTE — Telephone Encounter (Signed)
Patient cancelled her appointment on Monday, 12/06/20 for Cimzia injection.  Patient states she was prescribed antibiotics and will call back to reschedule when she finishes the prescription.

## 2020-12-04 ENCOUNTER — Other Ambulatory Visit: Payer: Self-pay

## 2020-12-04 ENCOUNTER — Ambulatory Visit
Admission: RE | Admit: 2020-12-04 | Discharge: 2020-12-04 | Disposition: A | Discharge status: Home / Self Care | Payer: 59 | Source: Ambulatory Visit | Attending: Emergency Medicine | Admitting: Emergency Medicine

## 2020-12-04 VITALS — BP 130/87 | HR 79 | Resp 20

## 2020-12-04 DIAGNOSIS — R070 Pain in throat: Secondary | ICD-10-CM

## 2020-12-04 MED ORDER — CETIRIZINE HCL 1 MG/ML PO SOLN: 10.0000 mg | Freq: Every day | ORAL | 0 refills | Status: DC

## 2020-12-04 MED ORDER — IBUPROFEN 100 MG/5ML PO SUSP: 600.0000 mg | Freq: Three times a day (TID) | ORAL | 0 refills | Status: DC | PRN

## 2020-12-04 NOTE — Discharge Instructions (Signed)
Begin daily cetirizine for congestion and postnasal drainage Ibuprofen and Tylenol for pain and swelling Warm compresses Complete course of Augmentin Follow-up with ENT as planned. Go to emergency room if symptoms worsening between now and April 4

## 2020-12-04 NOTE — ED Triage Notes (Addendum)
C/O continued pain to left side of throat despite having finished prednisone following [facility] visit 11/29/20.  Pt states feels no better, "and it feels like something's hanging in there"; c/o inability to swallow food for pills.  Now starting to see some blood when gargling. Has ENT appt scheduled later this month.

## 2020-12-04 NOTE — ED Provider Notes (Signed)
[name]    CSN: [id number] Arrival date & time: 12/04/20  1349      History   Chief Complaint Chief Complaint  Patient presents with  . Sore Throat    HPI Jennifer [name] is a 45 y.o. female presenting today for follow-up of neck pain.  She reports that she has continued to have left-sided neck/throat discomfort despite finishing course of Augmentin and course of prednisone provided.  Having difficulty swallowing food and pills.  Reports some blood with gargling.  Has appointment with ENT scheduled for later this month.  Fevers have improved.  Denies history of tobacco use.  HPI  Past Medical History:  Diagnosis Date  . Acute otitis media 08/28/2012   improved  change to liquid medication   . Anxiety   . Breast abscess of female    Recurrent  . Depression   . Family history of adverse reaction to anesthesia    father hard time waking once (12/20/2016)  . Fibroids    w/bleeding  . GERD (gastroesophageal reflux disease)   . History of blood transfusion    after surgery  . Hypertension   . Migraines    Dr. Orie Rout; "I have a few/month" (12/20/2016)  . Osteoarthritis   . Pill esophagitis 08/28/2012   amoxicillin  by hx  disc plan nl voice except hoarse not drooling  close fu  if not getting better with plan stop aleve  liquid ibu onlyf necessary   . Recurrent periodic urticaria   . Rheumatoid arthritis ([place])    "55% of my body" (12/20/2016)  . Rheumatoid arthritis ([place])   . Sleep apnea    wears cpap    Patient Active Problem List   Diagnosis Date Noted  . Osteoarthritis of knee 04/02/2020  . Pain in right knee 01/13/2020  . Chronic pain of right knee 01/21/2019  . S/P right knee arthroscopy 07/23/2018  . 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  . Primary osteoarthritis of both hands 02/04/2018  . Sleep apnea, obstructive 09/28/2017  . Primary osteoarthritis of both knees 05/22/2017  . Incisional hernia 12/18/2016  . Leiomyoma of uterus 09/06/2016  .  Fibroid, uterine   . Essential hypertension 05/25/2015  . Recurrent headache 05/25/2015  . Head lump 07/31/2014  . Edema 12/29/2013  . Baker's cyst, ruptured 12/25/2013  . Cough, persistent 12/12/2013  . Left leg swelling 12/12/2013  . Cough 12/12/2013  . High risk medication use 12/12/2013  . Recurrent periodic urticaria   . Rheumatoid arthritis ([place]) 08/28/2012  . CHRONIC LARYNGITIS 11/03/2010  . ALLERGIC RHINITIS 11/03/2010  . PARESTHESIA 07/28/2010  . DYSMENORRHEA 10/15/2007  . FIBROIDS, UTERUS 07/24/2007  . OBESITY 07/24/2007  . SLEEPLESSNESS 07/24/2007  . HEADACHE 07/24/2007    Past Surgical History:  Procedure Laterality Date  . BREAST SURGERY     abcess under left breast   . CYST EXCISION Left 2014   "elbow"  . HERNIA REPAIR    . INCISION AND DRAINAGE BREAST ABSCESS Left 2003  . INCISIONAL HERNIA REPAIR N/A 12/18/2016   Procedure: REPAIR INCISIONAL HERNIA WITH MESH;  Surgeon: Georganna Skeans, MD;  Location: [facility];  Service: General;  Laterality: N/A;  . INSERTION OF MESH N/A 12/18/2016   Procedure: INSERTION OF MESH;  Surgeon: Georganna Skeans, MD;  Location: [facility];  Service: General;  Laterality: N/A;  . KNEE ARTHROSCOPY WITH MENISCAL REPAIR Right 06/17/2018   Procedure: RIGHT KNEE ARTHROSCOPY WITH MENISCAL REPAIR;  Surgeon: Vickey Huger, MD;  Location: [facility];  Service:  Orthopedics;  Laterality: Right;  . MYOMECTOMY N/A 09/06/2016   Procedure: ABOMINAL MYOMECTOMY;  Surgeon: Governor Specking, MD;  Location: [facility];  Service: Gynecology;  Laterality: N/A;  . UTERINE FIBROID SURGERY     35 fibroids    OB History    Gravida  1   Para  1   Term      Preterm      AB      Living        SAB      IAB      Ectopic      Multiple      Live Births               Home Medications    Prior to Admission medications   Medication Sig Start Date End Date Taking? Authorizing Provider  amLODipine (NORVASC) 5 MG tablet TAKE 1 TABLET BY MOUTH EVERY DAY 02/24/19   Yes Panosh, Standley Brooking, MD  amoxicillin-clavulanate (AUGMENTIN) 600-42.9 MG/5ML suspension Take 5 mLs (600 mg total) by mouth 2 (two) times daily for 10 days. 11/29/20 12/09/20 Yes Barnabas Henriques C, PA-C  cetirizine HCl (ZYRTEC) 1 MG/ML solution Take 10 mLs (10 mg total) by mouth daily. 12/04/20  Yes Payzlee Ryder C, PA-C  cyclobenzaprine (FLEXERIL) 10 MG tablet TAKE 1 TABLET BY MOUTH EVERYDAY AT BEDTIME 10/17/19  Yes Panosh, Standley Brooking, MD  diclofenac sodium (VOLTAREN) 1 % GEL APPLY 4 G TOPICALLY 2 (TWO) TIMES DAILY. 06/23/19  Yes Deveshwar, Abel Presto, MD  fluocinonide ointment (LIDEX) 2.53 % Apply 1 application topically 2 (two) times daily.  12/03/17  Yes [provider]  ibuprofen (ADVIL) 100 MG/5ML suspension Take 30 mLs (600 mg total) by mouth every 8 (eight) hours as needed. 12/04/20  Yes Breandan People C, PA-C  Levonorgestrel (KYLEENA) 19.5 MG IUD Kyleena 17.5 mcg/24 hrs (10yrs) 19.$RemoveBefore'5mg'[id number]$  intrauterine device  Take 1 device by intrauterine route.   Yes [provider]  Melatonin 10 MG CAPS Take 20 mg by mouth at bedtime as needed (for sleep).   Yes [provider]  meloxicam (MOBIC) 15 MG tablet Take 15 mg by mouth daily. 08/06/20  Yes [provider]  prednisoLONE (PRELONE) 15 MG/5ML SOLN Take 10 mLs (30 mg total) by mouth daily before breakfast for 5 days. 11/29/20 12/04/20 Yes Romeka Scifres C, PA-C  SUMAtriptan (TOSYMRA) 10 MG/ACT SOLN Place 1 spray into the nose every hour as needed (Maximum 3 sprays in 24 hours.). 11/10/19   Pieter Partridge, DO  urea (GORDONS UREA) 40 % ointment Apply topically as needed. 11/15/20   Trula Slade, DPM  CIMZIA 2 X 200 MG KIT INJECT 400 MG UNDER THE SKIN EVERY 28 (TWENTY-EIGHT) DAYS 07/08/18 12/04/20  Bo Merino, MD  eletriptan (RELPAX) 40 MG tablet Take 1 tablet (40 mg total) by mouth as needed for migraine or headache. May repeat in 2 hours if headache persists or recurs. 10/02/19 11/29/20  Panosh, Standley Brooking, MD    Family  History Family History  Problem Relation Age of Onset  . Hypertension Mother   . Rheum arthritis Mother   . Hypertension Father   . Sleep apnea Father     Social History Social History   Tobacco Use  . Smoking status: Never Smoker  . Smokeless tobacco: Never Used  Vaping Use  . Vaping Use: Never used  Substance Use Topics  . Alcohol use: Yes    Comment: social  . Drug use: Never     Allergies  Lisinopril and Lisinopril-hydrochlorothiazide   Review of Systems Review of Systems  Constitutional: Negative for activity change, appetite change, chills, fatigue and fever.  HENT: Positive for sore throat. Negative for congestion, ear pain, rhinorrhea, sinus pressure and trouble swallowing.   Eyes: Negative for discharge and redness.  Respiratory: Negative for cough, chest tightness and shortness of breath.   Cardiovascular: Negative for chest pain.  Gastrointestinal: Negative for abdominal pain, diarrhea, nausea and vomiting.  Musculoskeletal: Negative for myalgias.  Skin: Negative for rash.  Neurological: Negative for dizziness, light-headedness and headaches.     Physical Exam Triage Vital Signs ED Triage Vitals [12/04/20 1401]  Enc Vitals Group     BP 130/87     Pulse Rate 79     Resp 20     Temp      Temp src      SpO2 96 %     Weight      Height      Head Circumference      Peak Flow      Pain Score 6     Pain Loc      Pain Edu?      Excl. in [place]?    No data found.  Updated Vital Signs BP 130/87   Pulse 79   Resp 20   SpO2 96%   Visual Acuity Right Eye Distance:   Left Eye Distance:   Bilateral Distance:    Right Eye Near:   Left Eye Near:    Bilateral Near:     Physical Exam Vitals and nursing note reviewed.  Constitutional:      Appearance: She is well-developed.     Comments: No acute distress  HENT:     Head: Normocephalic and atraumatic.     Ears:     Comments: Bilateral ears without tenderness to palpation of external auricle,  tragus and mastoid, EAC's without erythema or swelling, TM's with good bony landmarks and cone of light. Non erythematous.     Nose: Nose normal.     Mouth/Throat:     Comments: Bilateral tonsils large, but do not appear erythematous, no exudate, uvula midline, no soft palate swelling Eyes:     Conjunctiva/sclera: Conjunctivae normal.  Neck:     Comments: Left tonsillar area with mild lymphadenopathy, tender to palpation, no other obvious swelling noted Cardiovascular:     Rate and Rhythm: Normal rate.  Pulmonary:     Effort: Pulmonary effort is normal. No respiratory distress.  Abdominal:     General: There is no distension.  Musculoskeletal:        General: Normal range of motion.     Cervical back: Neck supple.  Skin:    General: Skin is warm and dry.  Neurological:     Mental Status: She is alert and oriented to person, place, and time.      UC Treatments / Results  Labs (all labs ordered are listed, but only abnormal results are displayed) Labs Reviewed - No data to display  EKG   Radiology No results found.  Procedures Procedures (including critical care time)  Medications Ordered in UC Medications - No data to display  Initial Impression / Assessment and Plan / UC Course  I have reviewed the triage vital signs and the nursing notes.  Pertinent labs & imaging results that were available during my care of the patient were reviewed by me and considered in my medical decision making (see chart for details).     Neck  pain/sore throat-possible lymphadenopathy, encouraged follow-up with ENT as if symptoms persisting will need further imaging of CT versus ultrasound versus laryngoscopy.  Recommended to complete course of Augmentin and continue anti-inflammatories, warm compresses and initiating on daily cetirizine to help with any postnasal drainage contributing to symptoms.  Patient stable at this time.  Discussed strict return precautions. Patient verbalized  understanding and is agreeable with plan.  Final Clinical Impressions(s) / UC Diagnoses   Final diagnoses:  Throat pain     Discharge Instructions     Begin daily cetirizine for congestion and postnasal drainage Ibuprofen and Tylenol for pain and swelling Warm compresses Complete course of Augmentin Follow-up with ENT as planned. Go to emergency room if symptoms worsening between now and April 4    ED Prescriptions    Medication Sig Dispense Auth. Provider   cetirizine HCl (ZYRTEC) 1 MG/ML solution Take 10 mLs (10 mg total) by mouth daily. 300 mL Lasean Rahming C, PA-C   ibuprofen (ADVIL) 100 MG/5ML suspension Take 30 mLs (600 mg total) by mouth every 8 (eight) hours as needed. 473 mL Queena Monrreal, [place] C, PA-C     PDMP not reviewed this encounter.   Janith Lima, PA-C 12/04/20 1431

## 2020-12-06 ENCOUNTER — Ambulatory Visit: Payer: 59

## 2020-12-20 ENCOUNTER — Ambulatory Visit (INDEPENDENT_AMBULATORY_CARE_PROVIDER_SITE_OTHER): Payer: 59 | Admitting: Otolaryngology

## 2020-12-20 ENCOUNTER — Other Ambulatory Visit: Payer: Self-pay

## 2020-12-20 ENCOUNTER — Encounter (INDEPENDENT_AMBULATORY_CARE_PROVIDER_SITE_OTHER): Payer: Self-pay | Admitting: Otolaryngology

## 2020-12-20 VITALS — Temp 97.7°F

## 2020-12-20 DIAGNOSIS — Z8709 Personal history of other diseases of the respiratory system: Secondary | ICD-10-CM | POA: Diagnosis not present

## 2020-12-20 DIAGNOSIS — J029 Acute pharyngitis, unspecified: Secondary | ICD-10-CM

## 2020-12-20 DIAGNOSIS — G4733 Obstructive sleep apnea (adult) (pediatric): Secondary | ICD-10-CM | POA: Diagnosis not present

## 2020-12-20 DIAGNOSIS — J353 Hypertrophy of tonsils with hypertrophy of adenoids: Secondary | ICD-10-CM | POA: Diagnosis not present

## 2020-12-20 NOTE — Progress Notes (Signed)
HPI: Jennifer [name] is a 45 y.o. female who presents is referred by emergency department for evaluation of throat complaints.  She apparently had a bad sore throat in January and then another bad sore throat last month that brought her to the ED.  Both times she was treated with antibiotics although her strep test were negative. The last time she had a fever associated with a sore throat. She does not smoke. She was diagnosed with obstructive sleep apnea about a year ago and has been on full shield CPAP over the past year. She also has history of rheumatoid arthritis and hypertension. She is not having a sore throat today but previously it was always on the left side..  Past Medical History:  Diagnosis Date  . Acute otitis media 08/28/2012   improved  change to liquid medication   . Anxiety   . Breast abscess of female    Recurrent  . Depression   . Family history of adverse reaction to anesthesia    father hard time waking once (12/20/2016)  . Fibroids    w/bleeding  . GERD (gastroesophageal reflux disease)   . History of blood transfusion    after surgery  . Hypertension   . Migraines    Dr. Orie Rout; "I have a few/month" (12/20/2016)  . Osteoarthritis   . Pill esophagitis 08/28/2012   amoxicillin  by hx  disc plan nl voice except hoarse not drooling  close fu  if not getting better with plan stop aleve  liquid ibu onlyf necessary   . Recurrent periodic urticaria   . Rheumatoid arthritis ([place])    "55% of my body" (12/20/2016)  . Rheumatoid arthritis ([place])   . Sleep apnea    wears cpap   Past Surgical History:  Procedure Laterality Date  . BREAST SURGERY     abcess under left breast   . CYST EXCISION Left 2014   "elbow"  . HERNIA REPAIR    . INCISION AND DRAINAGE BREAST ABSCESS Left 2003  . INCISIONAL HERNIA REPAIR N/A 12/18/2016   Procedure: REPAIR INCISIONAL HERNIA WITH MESH;  Surgeon: Georganna Skeans, MD;  Location: [facility];  Service: General;  Laterality: N/A;  .  INSERTION OF MESH N/A 12/18/2016   Procedure: INSERTION OF MESH;  Surgeon: Georganna Skeans, MD;  Location: [facility];  Service: General;  Laterality: N/A;  . KNEE ARTHROSCOPY WITH MENISCAL REPAIR Right 06/17/2018   Procedure: RIGHT KNEE ARTHROSCOPY WITH MENISCAL REPAIR;  Surgeon: Vickey Huger, MD;  Location: [facility];  Service: Orthopedics;  Laterality: Right;  . MYOMECTOMY N/A 09/06/2016   Procedure: ABOMINAL MYOMECTOMY;  Surgeon: Governor Specking, MD;  Location: [facility];  Service: Gynecology;  Laterality: N/A;  . UTERINE FIBROID SURGERY     35 fibroids   Social History   Socioeconomic History  . Marital status: Single    Spouse name: Not on file  . Number of children: Not on file  . Years of education: Not on file  . Highest education level: Not on file  Occupational History  . Not on file  Tobacco Use  . Smoking status: Never Smoker  . Smokeless tobacco: Never Used  Vaping Use  . Vaping Use: Never used  Substance and Sexual Activity  . Alcohol use: Yes    Comment: social  . Drug use: Never  . Sexual activity: Not on file  Other Topics Concern  . Not on file  Social History Narrative  . Not on file   Social Determinants  of Health   Financial Resource Strain: Not on file  Food Insecurity: Not on file  Transportation Needs: Not on file  Physical Activity: Not on file  Stress: Not on file  Social Connections: Not on file   Family History  Problem Relation Age of Onset  . Hypertension Mother   . Rheum arthritis Mother   . Hypertension Father   . Sleep apnea Father    Allergies  Allergen Reactions  . Lisinopril Anaphylaxis  . Lisinopril-Hydrochlorothiazide Swelling and Other (See Comments)    Angioedema  Has tolerated maxide in past so most likely  The ACE inhibitor as the cause    Prior to Admission medications   Medication Sig Start Date End Date Taking? Authorizing Provider  amLODipine (NORVASC) 5 MG tablet TAKE 1 TABLET BY MOUTH EVERY DAY 02/24/19   Panosh, Standley Brooking, MD   cetirizine HCl (ZYRTEC) 1 MG/ML solution Take 10 mLs (10 mg total) by mouth daily. 12/04/20   Wieters, Hallie C, PA-C  cyclobenzaprine (FLEXERIL) 10 MG tablet TAKE 1 TABLET BY MOUTH EVERYDAY AT BEDTIME 10/17/19   Panosh, Standley Brooking, MD  diclofenac sodium (VOLTAREN) 1 % GEL APPLY 4 G TOPICALLY 2 (TWO) TIMES DAILY. 06/23/19   Bo Merino, MD  fluocinonide ointment (LIDEX) 1.61 % Apply 1 application topically 2 (two) times daily.  12/03/17   [provider]  ibuprofen (ADVIL) 100 MG/5ML suspension Take 30 mLs (600 mg total) by mouth every 8 (eight) hours as needed. 12/04/20   Wieters, Hallie C, PA-C  Levonorgestrel (KYLEENA) 19.5 MG IUD Kyleena 17.5 mcg/24 hrs (3yr) 19.538mintrauterine device  Take 1 device by intrauterine route.    [provider]  Melatonin 10 MG CAPS Take 20 mg by mouth at bedtime as needed (for sleep).    [provider]  meloxicam (MOBIC) 15 MG tablet Take 15 mg by mouth daily. 08/06/20   [provider]  SUMAtriptan (TOSYMRA) 10 MG/ACT SOLN Place 1 spray into the nose every hour as needed (Maximum 3 sprays in 24 hours.). 11/10/19   JaPieter PartridgeDO  urea (GORDONS UREA) 40 % ointment Apply topically as needed. 11/15/20   WaTrula SladeDPM  CIMZIA 2 X 200 MG KIT INJECT 400 MG UNDER THE SKIN EVERY 28 (TWENTY-EIGHT) DAYS 07/08/18 12/04/20  DeBo MerinoMD  eletriptan (RELPAX) 40 MG tablet Take 1 tablet (40 mg total) by mouth as needed for migraine or headache. May repeat in 2 hours if headache persists or recurs. 10/02/19 11/29/20  Panosh, WaStandley BrookingMD     Positive ROS: Otherwise negative  All other systems have been reviewed and were otherwise negative with the exception of those mentioned in the HPI and as above.  Physical Exam: Constitutional: Alert, well-appearing, no acute distress Ears: External ears without lesions or tenderness. Ear canals are clear bilaterally with intact, clear TMs.  Nasal: External nose without lesions.  Septum slightly deviated to the right.  No intranasal polyps noted.. Clear nasal passages otherwise. Oral: Lips and gums without lesions. Tongue and palate mucosa without lesions. Posterior oropharynx clear.  Tonsils are large and generous size bilaterally 3+. Fiberoptic laryngoscopy was performed to the left nostril.  She had generous size adenoid tissue but the nasopharynx was otherwise clear.  Again noted are large tonsils in the oropharynx.  The base of tongue vallecula epiglottis were normal.  Piriform sinuses were clear.  Vocal cords were clear with normal vocal mobility.  Remaining hypopharynx was clear. Neck: No palpable adenopathy or  masses. Cardiac exam: Regular rate and rhythm without murmur Respiratory: Breathing comfortably.  Lungs clear to auscultation. Skin: No facial/neck lesions or rash noted.  Procedures  Assessment: I suspect her sore throat was probably related to tonsillitis possibly viral in nature or bacterial nature.  The remaining hypopharynx and larynx was normal to examination.  However she is having no sore throat today. Obstructive sleep apnea Mild rhinitis with moderate adenoid hypertrophy  Plan: Reviewed with her concerning normal upper airway examination with no evidence of neoplasm. I reviewed that most likely source of her sore throat was probably tonsillar infection as she has large tonsils. If she continues to have frequent sore throats briefly discussed the option of tonsillectomy and adenoidectomy with her.  I reviewed the morbidity associated with the surgery and that she would have to stay in the hospital overnight because of obstructive sleep apnea.  However in the long run T&A surgery would probably improve her obstructive sleep apnea as I am sure the tonsils are contributing to her obstruction. She is working on losing weight. She will call us back if she decides to pursue T&A.  I discussed the morbidity associated with surgery and that she would have to  be out of work for 10 to 14 days.   Radene Journey, MD   CC:

## 2020-12-30 ENCOUNTER — Telehealth: Payer: Self-pay

## 2020-12-30 NOTE — Telephone Encounter (Signed)
Spoke with the patient to inform her that she needs a visit prior to the completion of her pre-op forms. Patient stated she scheduled an appointment for 06/13.

## 2021-01-13 ENCOUNTER — Telehealth: Payer: Self-pay

## 2021-01-13 NOTE — Telephone Encounter (Signed)
I called patient to check on her since she has not called to reschedule Cimzia injection. Patient states she is off of antibiotics and is scheduled for knee surgery in July 2022. She has a follow up appointment scheduled with Dr. Estanislado Pandy on 02/10/2021. Patient will update labs tomorrow in the office. Provided patient with walk in lab hours and standing orders are in place.   Pending lab results, patient would like to schedule Cimzia injection.

## 2021-01-28 NOTE — Progress Notes (Addendum)
Office Visit Note  Patient: Jennifer [name]             Date of Birth: 1975-11-19           MRN: [id number]             PCP: Burnis Medin, MD Referring: Burnis Medin, MD Visit Date: 02/10/2021 Occupation: @GUAROCC @  Subjective:  Right knee pain.[name]   History of Present Illness: Jennifer [name] is a 45 y.o. female with history of rheumatoid arthritis.  She states that she has been off Cimzia and Rasuvo  since December 2021 due to have recurrent tonsillitis.  She is scheduled to have right total knee replacement by Dr. Maureen Ralphs on July 11.    She has some joint stiffness but no increased joint swelling.  The stiffness is mostly in her hands.  Activities of Daily Living:  Patient reports morning stiffness for several hours.   Patient Denies nocturnal pain.  Difficulty dressing/grooming: Denies Difficulty climbing stairs: Reports Difficulty getting out of chair: Reports Difficulty using hands for taps, buttons, cutlery, and/or writing: Denies  Review of Systems  Constitutional: Negative for fatigue.  HENT: Negative for mouth sores, mouth dryness and nose dryness.   Eyes: Negative for pain, itching, visual disturbance and dryness.  Respiratory: Negative for cough, hemoptysis, shortness of breath, wheezing and difficulty breathing.   Cardiovascular: Negative for chest pain, palpitations and swelling in legs/feet.  Gastrointestinal: Negative for abdominal pain, blood in stool, constipation and diarrhea.  Endocrine: Negative for increased urination.  Genitourinary: Negative for painful urination.  Musculoskeletal: Positive for arthralgias, joint pain and morning stiffness. Negative for joint swelling, myalgias, muscle weakness, muscle tenderness and myalgias.  Skin: Negative for color change, rash and redness.  Allergic/Immunologic: Negative for susceptible to infections.  Neurological: Negative for dizziness, numbness, memory loss and weakness.  Hematological: Negative for swollen  glands.  Psychiatric/Behavioral: Positive for sleep disturbance. Negative for confusion.    PMFS History:  Patient Active Problem List   Diagnosis Date Noted  . Osteoarthritis of knee 04/02/2020  . Pain in right knee 01/13/2020  . Chronic pain of right knee 01/21/2019  . S/P right knee arthroscopy 07/23/2018  . 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  . Primary osteoarthritis of both hands 02/04/2018  . Sleep apnea, obstructive 09/28/2017  . Primary osteoarthritis of both knees 05/22/2017  . Incisional hernia 12/18/2016  . Leiomyoma of uterus 09/06/2016  . Fibroid, uterine   . Essential hypertension 05/25/2015  . Recurrent headache 05/25/2015  . Head lump 07/31/2014  . Edema 12/29/2013  . Baker's cyst, ruptured 12/25/2013  . Cough, persistent 12/12/2013  . Left leg swelling 12/12/2013  . Cough 12/12/2013  . High risk medication use 12/12/2013  . Recurrent periodic urticaria   . Rheumatoid arthritis ([place]) 08/28/2012  . CHRONIC LARYNGITIS 11/03/2010  . ALLERGIC RHINITIS 11/03/2010  . PARESTHESIA 07/28/2010  . DYSMENORRHEA 10/15/2007  . FIBROIDS, UTERUS 07/24/2007  . OBESITY 07/24/2007  . SLEEPLESSNESS 07/24/2007  . HEADACHE 07/24/2007    Past Medical History:  Diagnosis Date  . Acute otitis media 08/28/2012   improved  change to liquid medication   . Anxiety   . Breast abscess of female    Recurrent  . Depression   . Family history of adverse reaction to anesthesia    father hard time waking once (12/20/2016)  . Fibroids    w/bleeding  . GERD (gastroesophageal reflux disease)   . History of blood transfusion    after surgery  . Hypertension   .  Migraines    Dr. Orie Rout; "I have a few/month" (12/20/2016)  . Osteoarthritis   . Pill esophagitis 08/28/2012   amoxicillin  by hx  disc plan nl voice except hoarse not drooling  close fu  if not getting better with plan stop aleve  liquid ibu onlyf necessary   . Recurrent periodic urticaria   . Rheumatoid arthritis ([place])     "55% of my body" (12/20/2016)  . Rheumatoid arthritis ([place])   . Sleep apnea    wears cpap    Family History  Problem Relation Age of Onset  . Hypertension Mother   . Rheum arthritis Mother   . Hypertension Father   . Sleep apnea Father    Past Surgical History:  Procedure Laterality Date  . BREAST SURGERY     abcess under left breast   . CYST EXCISION Left 2014   "elbow"  . HERNIA REPAIR    . INCISION AND DRAINAGE BREAST ABSCESS Left 2003  . INCISIONAL HERNIA REPAIR N/A 12/18/2016   Procedure: REPAIR INCISIONAL HERNIA WITH MESH;  Surgeon: Georganna Skeans, MD;  Location: [facility];  Service: General;  Laterality: N/A;  . INSERTION OF MESH N/A 12/18/2016   Procedure: INSERTION OF MESH;  Surgeon: Georganna Skeans, MD;  Location: [facility];  Service: General;  Laterality: N/A;  . KNEE ARTHROSCOPY WITH MENISCAL REPAIR Right 06/17/2018   Procedure: RIGHT KNEE ARTHROSCOPY WITH MENISCAL REPAIR;  Surgeon: Vickey Huger, MD;  Location: [facility];  Service: Orthopedics;  Laterality: Right;  . MYOMECTOMY N/A 09/06/2016   Procedure: ABOMINAL MYOMECTOMY;  Surgeon: Governor Specking, MD;  Location: [facility];  Service: Gynecology;  Laterality: N/A;  . UTERINE FIBROID SURGERY     34 fibroids   Social History   Social History Narrative  . Not on file   Immunization History  Administered Date(s) Administered  . Influenza Split 08/21/2012  . Influenza,inj,Quad PF,6+ Mos 07/31/2014, 08/19/2019  . Pneumococcal Conjugate-13 08/28/2012     Objective: Vital Signs: BP (!) 144/93 (BP Location: Left Arm, Patient Position: Sitting, Cuff Size: Large)   Pulse 62   Ht 5' 4"  (1.626 m)   Wt 240 lb 6.4 oz (109 kg)   BMI 41.26 kg/m    Physical Exam Vitals and nursing note reviewed.  Constitutional:      Appearance: She is well-developed.  HENT:     Head: Normocephalic and atraumatic.  Eyes:     Conjunctiva/sclera: Conjunctivae normal.  Cardiovascular:     Rate and Rhythm: Normal rate and regular rhythm.     Heart  sounds: Normal heart sounds.  Pulmonary:     Effort: Pulmonary effort is normal.     Breath sounds: Normal breath sounds.  Abdominal:     General: Bowel sounds are normal.     Palpations: Abdomen is soft.  Musculoskeletal:     Cervical back: Normal range of motion.  Lymphadenopathy:     Cervical: No cervical adenopathy.  Skin:    General: Skin is warm and dry.     Capillary Refill: Capillary refill takes less than 2 seconds.  Neurological:     Mental Status: She is alert and oriented to person, place, and time.  Psychiatric:        Behavior: Behavior normal.      Musculoskeletal Exam: The spine was in good range of motion.  Shoulder joints, elbow joints, wrist joints, MCPs PIPs and DIPs with good range of motion with no synovitis.  Hip joints are in good range of  motion.  She has warmth and swelling in her right knee joint with limited extension..  Left knee joint has intact range of motion without warmth swelling or effusion.  There was no tenderness over ankles or MTPs.  CDAI Exam: CDAI Score: 2.4  Patient Global: 2 mm; Provider Global: 2 mm Swollen: 1 ; Tender: 1  Joint Exam 02/10/2021      Right  Left  Knee  Swollen Tender        Investigation: No additional findings.  Imaging: No results found.  Recent Labs: Lab Results  Component Value Date   WBC 4.7 06/10/2020   HGB 13.6 06/10/2020   PLT 315 06/10/2020   NA 141 07/02/2020   K 4.3 07/02/2020   CL 107 07/02/2020   CO2 25 07/02/2020   GLUCOSE 89 07/02/2020   BUN 18 07/02/2020   CREATININE 0.95 07/02/2020   BILITOT 0.6 06/10/2020   ALKPHOS 69 06/11/2018   AST 14 06/10/2020   ALT 9 06/10/2020   PROT 8.0 06/10/2020   ALBUMIN 3.8 06/11/2018   CALCIUM 9.1 07/02/2020   GFRAA 84 07/02/2020   QFTBGOLDPLUS NEGATIVE 02/18/2020    Speciality Comments: Prior therapy: Plaquenil (inadequate response)  Procedures:  No procedures performed Allergies: Lisinopril and Lisinopril-hydrochlorothiazide   Assessment  / Plan:     Visit Diagnoses: Rheumatoid arthritis involving multiple sites with positive rheumatoid factor ([place]) - +CCP, +HLA B27: She has been off Cimzia and Rasuvo since December 2021.  She states she had recurrent tonsillitis and could not take the immunosuppressive agents.  She has not had a flare of rheumatoid arthritis.  She continues to have pain and discomfort in her right knee joint.  She wants to hold off medications that she is a scheduled for right total knee replacement and July.  High risk medication use - Cimzia 400 mg sq injections in the office every 28 days, Rasuvo 20 mg sq injections once weekly and folic acid 1 mg 2 tablets daily.(on hold- since 01/22) - Plan: QuantiFERON-TB Gold Plus she will get CBC, CMP with GFR and TB Gold prior to her next visit in anticipation to start on DMARDs.  Primary osteoarthritis of both hands-she had no synovitis on examination.  Primary osteoarthritis of both knees-she had good range of motion in her left knee joint without any warmth or swelling.  S/P right knee arthroscopy-she had limited extension of her right knee joint with warmth on palpation.  No effusion was noted.  She is scheduled to have right total knee replacement in July.  She will hold off immunosuppression for right now.  Essential hypertension-blood pressure is elevated.  She is advised to monitor blood pressure closely.   Orders: Orders Placed This Encounter  Procedures  . QuantiFERON-TB Gold Plus   No orders of the defined types were placed in this encounter.    Follow-Up Instructions: Return in about 3 months (around 05/13/2021) for Rheumatoid arthritis.   Bo Merino, MD  Note - This record has been created using Editor, commissioning.  Chart creation errors have been sought, but may not always  have been located. Such creation errors do not reflect on  the standard of medical care.

## 2021-02-01 NOTE — Telephone Encounter (Signed)
Patient has not updated labs yet. I attempted to contact patient and left message on machine for patient.

## 2021-02-10 ENCOUNTER — Ambulatory Visit (INDEPENDENT_AMBULATORY_CARE_PROVIDER_SITE_OTHER): Payer: 59 | Admitting: Rheumatology

## 2021-02-10 ENCOUNTER — Encounter: Payer: Self-pay | Admitting: Rheumatology

## 2021-02-10 ENCOUNTER — Other Ambulatory Visit: Payer: Self-pay

## 2021-02-10 VITALS — BP 144/93 | HR 62 | Ht 64.0 in | Wt 240.4 lb

## 2021-02-10 DIAGNOSIS — Z9889 Other specified postprocedural states: Secondary | ICD-10-CM

## 2021-02-10 DIAGNOSIS — M170 Bilateral primary osteoarthritis of knee: Secondary | ICD-10-CM | POA: Diagnosis not present

## 2021-02-10 DIAGNOSIS — I10 Essential (primary) hypertension: Secondary | ICD-10-CM

## 2021-02-10 DIAGNOSIS — Z79899 Other long term (current) drug therapy: Secondary | ICD-10-CM | POA: Diagnosis not present

## 2021-02-10 DIAGNOSIS — M19041 Primary osteoarthritis, right hand: Secondary | ICD-10-CM | POA: Diagnosis not present

## 2021-02-10 DIAGNOSIS — M0579 Rheumatoid arthritis with rheumatoid factor of multiple sites without organ or systems involvement: Secondary | ICD-10-CM

## 2021-02-10 DIAGNOSIS — M19042 Primary osteoarthritis, left hand: Secondary | ICD-10-CM

## 2021-02-28 ENCOUNTER — Other Ambulatory Visit: Payer: Self-pay

## 2021-02-28 ENCOUNTER — Ambulatory Visit (INDEPENDENT_AMBULATORY_CARE_PROVIDER_SITE_OTHER): Payer: 59 | Admitting: Internal Medicine

## 2021-02-28 ENCOUNTER — Encounter: Payer: Self-pay | Admitting: Internal Medicine

## 2021-02-28 VITALS — BP 136/78 | HR 82 | Temp 98.5°F | Ht 64.0 in | Wt 237.4 lb

## 2021-02-28 DIAGNOSIS — M1711 Unilateral primary osteoarthritis, right knee: Secondary | ICD-10-CM

## 2021-02-28 DIAGNOSIS — G4733 Obstructive sleep apnea (adult) (pediatric): Secondary | ICD-10-CM

## 2021-02-28 DIAGNOSIS — I10 Essential (primary) hypertension: Secondary | ICD-10-CM

## 2021-02-28 DIAGNOSIS — Z01818 Encounter for other preprocedural examination: Secondary | ICD-10-CM | POA: Diagnosis not present

## 2021-02-28 DIAGNOSIS — M0579 Rheumatoid arthritis with rheumatoid factor of multiple sites without organ or systems involvement: Secondary | ICD-10-CM | POA: Diagnosis not present

## 2021-02-28 DIAGNOSIS — E66813 Obesity, class 3: Secondary | ICD-10-CM

## 2021-02-28 LAB — POC URINALSYSI DIPSTICK (AUTOMATED)
Bilirubin, UA: NEGATIVE
Blood, UA: NEGATIVE
Glucose, UA: NEGATIVE
Ketones, UA: NEGATIVE
Leukocytes, UA: NEGATIVE
Nitrite, UA: NEGATIVE
Protein, UA: POSITIVE — AB
Spec Grav, UA: 1.025 (ref 1.010–1.025)
Urobilinogen, UA: 0.2 E.U./dL
pH, UA: 6 (ref 5.0–8.0)

## 2021-02-28 NOTE — Patient Instructions (Signed)
Lab today   Continue lifestyle intervention healthy eating and exercise . Will send  form for surgery clearance   preoperative assessment.to Dr Maureen Ralphs office.

## 2021-02-28 NOTE — Progress Notes (Signed)
Chief Complaint  Patient presents with   Surgical clearance     HPI: Jennifer [name] 45 y.o. come in for preoperative evaluation for planned TKA right knee for ongoing arthritis bone-on-bone with history of previous arthroscopic surgery.  Dr.Alusio Since January she has Lost over 67 #   [place] program.  She is off her JRA meds since January because of cautions.  Her choice. Recently diagnosed by Dr. Nicki Reaper with "eye arthritis" which sounds like a red night on prednisone drops 4 times daily for a couple weeks to decrease to twice daily.  Question uveitis iritis   No cardiovascular or pulmonary or neurologic symptoms of import occasional tingling in the left arm.  She has sleep apnea using Cpap machine   still is in seem to matter whether she uses her machine or not but continues. Bp usually 140  .  Or controlled on medication no change No unusual bleeding and not on aspirin or blood thinners. ROS: See pertinent positives and negatives per HPI.  Past Medical History:  Diagnosis Date   Acute otitis media 08/28/2012   improved  change to liquid medication    Anxiety    Breast abscess of female    Recurrent   Depression    Family history of adverse reaction to anesthesia    father hard time waking once (12/20/2016)   Fibroids    w/bleeding   GERD (gastroesophageal reflux disease)    History of blood transfusion    after surgery   Hypertension    Migraines    Dr. Orie Rout; "I have a few/month" (12/20/2016)   Osteoarthritis    Pill esophagitis 08/28/2012   amoxicillin  by hx  disc plan nl voice except hoarse not drooling  close fu  if not getting better with plan stop aleve  liquid ibu onlyf necessary    Recurrent periodic urticaria    Rheumatoid arthritis ([place])    "55% of my body" (12/20/2016)   Rheumatoid arthritis ([place])    Sleep apnea    wears cpap    Family History  Problem Relation Age of Onset   Hypertension Mother    Rheum arthritis Mother    Hypertension  Father    Sleep apnea Father     Social History   Socioeconomic History   Marital status: Single    Spouse name: Not on file   Number of children: Not on file   Years of education: Not on file   Highest education level: Not on file  Occupational History   Not on file  Tobacco Use   Smoking status: Never   Smokeless tobacco: Never  Vaping Use   Vaping Use: Never used  Substance and Sexual Activity   Alcohol use: Yes    Comment: social   Drug use: Never   Sexual activity: Not on file  Other Topics Concern   Not on file  Social History Narrative   Not on file   Social Determinants of Health   Financial Resource Strain: Not on file  Food Insecurity: Not on file  Transportation Needs: Not on file  Physical Activity: Not on file  Stress: Not on file  Social Connections: Not on file    Outpatient Medications Prior to Visit  Medication Sig Dispense Refill   amLODipine (NORVASC) 5 MG tablet TAKE 1 TABLET BY MOUTH EVERY DAY 90 tablet 1   cetirizine HCl (ZYRTEC) 1 MG/ML solution Take 10 mLs (10 mg total) by mouth daily. [place]  mL 0   diclofenac sodium (VOLTAREN) 1 % GEL APPLY 4 G TOPICALLY 2 (TWO) TIMES DAILY. 400 g 3   ibuprofen (ADVIL) 100 MG/5ML suspension Take 30 mLs (600 mg total) by mouth every 8 (eight) hours as needed. 473 mL 0   Levonorgestrel (KYLEENA) 19.5 MG IUD Kyleena 17.5 mcg/24 hrs (61yrs) 19.5mg  intrauterine device  Take 1 device by intrauterine route.     Melatonin 10 MG CAPS Take 20 mg by mouth at bedtime as needed (for sleep).     meloxicam (MOBIC) 15 MG tablet Take 15 mg by mouth daily.     prednisoLONE acetate (PRED FORTE) 1 % ophthalmic suspension      SUMAtriptan (TOSYMRA) 10 MG/ACT SOLN Place 1 spray into the nose every hour as needed (Maximum 3 sprays in 24 hours.). 6 each 11   No facility-administered medications prior to visit.     EXAM:  BP 136/78 (BP Location: Left Arm, Patient Position: Sitting, Cuff Size: Large)   Pulse 82   Temp 98.5 F  (36.9 C) (Oral)   Ht 5\' 4"  (1.626 m)   Wt 237 lb 6.4 oz (107.7 kg)   SpO2 96%   BMI 40.75 kg/m   Body mass index is 40.75 kg/m. Wt Readings from Last 3 Encounters:  02/28/21 237 lb 6.4 oz (107.7 kg)  02/10/21 240 lb 6.4 oz (109 kg)  10/14/20 261 lb (118.4 kg)    GENERAL: vitals reviewed and listed above, alert, oriented, appears well hydrated and in no acute distress HEENT: atraumatic, conjunctiva  clear, no obvious abnormalities on inspection of external nose and ears TMs clear OP : masked  NECK: no obvious masses on inspection palpation  LUNGS: clear to auscultation bilaterally, no wheezes, rales or rhonchi, good air movement CV: HRRR, no clubbing cyanosis or  peripheral edema nl cap refill  Abdomen soft without again a megaly guarding or rebound Skin no active infected skin lesions or bruising MS: moves all extremities right knee +1 effusion adequate range of motion no warmth or redness walking with cane today. Neuro grossly non focal  intact PSYCH: pleasant and cooperative, no obvious depression or anxiety Lab Results  Component Value Date   WBC 4.7 06/10/2020   HGB 13.6 06/10/2020   HCT 41.2 06/10/2020   PLT 315 06/10/2020   GLUCOSE 89 07/02/2020   CHOL 175 07/02/2020   TRIG 52 07/02/2020   HDL 75 07/02/2020   LDLCALC 87 07/02/2020   ALT 9 06/10/2020   AST 14 06/10/2020   NA 141 07/02/2020   K 4.3 07/02/2020   CL 107 07/02/2020   CREATININE 0.95 07/02/2020   BUN 18 07/02/2020   CO2 25 07/02/2020   TSH 1.46 07/02/2020   INR 1.20 09/07/2016   HGBA1C 5.5 07/02/2020   BP Readings from Last 3 Encounters:  02/28/21 136/78  02/10/21 (!) 144/93  12/04/20 130/87    ASSESSMENT AND PLAN:  Discussed the following assessment and plan:  Osteoarthritis of right knee, unspecified osteoarthritis type - Plan: Basic metabolic panel, CBC with Differential/Platelet, Hepatic function panel, Hemoglobin A1c, Protime-INR, Protime-INR, Hemoglobin A1c, Hepatic function panel, CBC  with Differential/Platelet, Basic metabolic panel, CANCELED: POCT Urinalysis Dipstick (Automated)  Pre-op evaluation - Plan: Basic metabolic panel, CBC with Differential/Platelet, Hepatic function panel, Hemoglobin A1c, Protime-INR, Protime-INR, Hemoglobin A1c, Hepatic function panel, CBC with Differential/Platelet, Basic metabolic panel, POCT Urinalysis Dipstick (Automated), CANCELED: POCT Urinalysis Dipstick (Automated)  Rheumatoid arthritis involving multiple sites with positive rheumatoid factor ([facility]) - Plan: Basic metabolic panel, CBC  with Differential/Platelet, Hepatic function panel, Hemoglobin A1c, Protime-INR, Protime-INR, Hemoglobin A1c, Hepatic function panel, CBC with Differential/Platelet, Basic metabolic panel, CANCELED: POCT Urinalysis Dipstick (Automated)  Essential hypertension - Plan: Basic metabolic panel, CBC with Differential/Platelet, Hepatic function panel, Hemoglobin A1c, Protime-INR, Protime-INR, Hemoglobin A1c, Hepatic function panel, CBC with Differential/Platelet, Basic metabolic panel, CANCELED: POCT Urinalysis Dipstick (Automated)  Obesity, Class III, BMI 40-49.9 (morbid obesity) ([place]) - Has lost significant weight since January to continue. - Plan: Basic metabolic panel, CBC with Differential/Platelet, Hepatic function panel, Hemoglobin A1c, Protime-INR, Protime-INR, Hemoglobin A1c, Hepatic function panel, CBC with Differential/Platelet, Basic metabolic panel, CANCELED: POCT Urinalysis Dipstick (Automated)  Sleep apnea, obstructive Has been off immunosuppressive medications and no active vascular pulmonary bleeding or neurologic conditions. Obstructive sleep apnea is treated. Blood pressure is adequately controlled for surgery Will wait for laboratory studies and send assessment to surgeon to be able to proceed with surgery. I do not think that eye inflammation treated with prednisone drops is a medical contraindication to her surgery proceeding. -Patient advised to  return or notify health care team  if  new concerns arise. In interim   Patient Instructions  Lab today   Continue lifestyle intervention healthy eating and exercise . Will send  form for surgery clearance   preoperative assessment.to Dr Maureen Ralphs office.   Standley Brooking. Tomeko Scoville M.D.

## 2021-03-01 LAB — CBC WITH DIFFERENTIAL/PLATELET
Basophils Absolute: 0.1 10*3/uL (ref 0.0–0.1)
Basophils Relative: 1.1 % (ref 0.0–3.0)
Eosinophils Absolute: 0.1 10*3/uL (ref 0.0–0.7)
Eosinophils Relative: 2.1 % (ref 0.0–5.0)
HCT: 36.8 % (ref 36.0–46.0)
Hemoglobin: 12.3 g/dL (ref 12.0–15.0)
Lymphocytes Relative: 52.9 % — ABNORMAL HIGH (ref 12.0–46.0)
Lymphs Abs: 2.5 10*3/uL (ref 0.7–4.0)
MCHC: 33.4 g/dL (ref 30.0–36.0)
MCV: 91.4 fl (ref 78.0–100.0)
Monocytes Absolute: 0.5 10*3/uL (ref 0.1–1.0)
Monocytes Relative: 11.4 % (ref 3.0–12.0)
Neutro Abs: 1.5 10*3/uL (ref 1.4–7.7)
Neutrophils Relative %: 32.5 % — ABNORMAL LOW (ref 43.0–77.0)
Platelets: 263 10*3/uL (ref 150.0–400.0)
RBC: 4.02 Mil/uL (ref 3.87–5.11)
RDW: 13 % (ref 11.5–15.5)
WBC: 4.8 10*3/uL (ref 4.0–10.5)

## 2021-03-01 LAB — PROTIME-INR
INR: 1.1 ratio — ABNORMAL HIGH (ref 0.8–1.0)
Prothrombin Time: 11.9 s (ref 9.6–13.1)

## 2021-03-01 LAB — BASIC METABOLIC PANEL
BUN: 19 mg/dL (ref 6–23)
CO2: 30 mEq/L (ref 19–32)
Calcium: 9.5 mg/dL (ref 8.4–10.5)
Chloride: 104 mEq/L (ref 96–112)
Creatinine, Ser: 0.89 mg/dL (ref 0.40–1.20)
GFR: 78.15 mL/min (ref >60.00)
Glucose, Bld: 112 mg/dL — ABNORMAL HIGH (ref 70–99)
Potassium: 4.2 mEq/L (ref 3.5–5.1)
Sodium: 140 mEq/L (ref 135–145)

## 2021-03-01 LAB — HEPATIC FUNCTION PANEL
ALT: 11 U/L (ref 0–35)
AST: 17 U/L (ref 0–37)
Albumin: 4.1 g/dL (ref 3.5–5.2)
Alkaline Phosphatase: 71 U/L (ref 39–117)
Bilirubin, Direct: 0.1 mg/dL (ref 0.0–0.3)
Total Bilirubin: 0.4 mg/dL (ref 0.2–1.2)
Total Protein: 8.2 g/dL (ref 6.0–8.3)

## 2021-03-01 LAB — HEMOGLOBIN A1C: Hgb A1c MFr Bld: 5.7 % (ref 4.6–6.5)

## 2021-03-01 NOTE — Progress Notes (Signed)
Hemoglobin A1c is in range normal renal function, no anemia. Forwarding a copy of lab results to Dr. Maureen Ralphs   as a pre op.

## 2021-03-02 ENCOUNTER — Ambulatory Visit: Payer: 59 | Admitting: Internal Medicine

## 2021-03-14 ENCOUNTER — Other Ambulatory Visit: Payer: Self-pay

## 2021-03-14 ENCOUNTER — Ambulatory Visit
Admission: RE | Admit: 2021-03-14 | Discharge: 2021-03-14 | Disposition: A | Discharge status: Home / Self Care | Payer: 59 | Source: Ambulatory Visit | Attending: Emergency Medicine | Admitting: Emergency Medicine

## 2021-03-14 VITALS — BP 167/104 | HR 73 | Temp 98.1°F | Resp 18

## 2021-03-14 DIAGNOSIS — Z79899 Other long term (current) drug therapy: Secondary | ICD-10-CM | POA: Diagnosis not present

## 2021-03-14 DIAGNOSIS — R1030 Lower abdominal pain, unspecified: Secondary | ICD-10-CM | POA: Diagnosis present

## 2021-03-14 DIAGNOSIS — M069 Rheumatoid arthritis, unspecified: Secondary | ICD-10-CM | POA: Diagnosis not present

## 2021-03-14 DIAGNOSIS — I10 Essential (primary) hypertension: Secondary | ICD-10-CM | POA: Diagnosis not present

## 2021-03-14 DIAGNOSIS — N898 Other specified noninflammatory disorders of vagina: Secondary | ICD-10-CM | POA: Diagnosis not present

## 2021-03-14 DIAGNOSIS — Z87440 Personal history of urinary (tract) infections: Secondary | ICD-10-CM | POA: Insufficient documentation

## 2021-03-14 DIAGNOSIS — Z8616 Personal history of COVID-19: Secondary | ICD-10-CM | POA: Insufficient documentation

## 2021-03-14 DIAGNOSIS — M545 Low back pain, unspecified: Secondary | ICD-10-CM | POA: Insufficient documentation

## 2021-03-14 DIAGNOSIS — Z86018 Personal history of other benign neoplasm: Secondary | ICD-10-CM | POA: Diagnosis not present

## 2021-03-14 DIAGNOSIS — Z888 Allergy status to other drugs, medicaments and biological substances status: Secondary | ICD-10-CM | POA: Diagnosis not present

## 2021-03-14 LAB — POCT URINALYSIS DIP (MANUAL ENTRY)
Blood, UA: NEGATIVE
Glucose, UA: NEGATIVE mg/dL
Leukocytes, UA: NEGATIVE
Nitrite, UA: NEGATIVE
Protein Ur, POC: 30 mg/dL — AB
Spec Grav, UA: >=1.03 — AB (ref 1.010–1.025)
Urobilinogen, UA: 0.2 E.U./dL
pH, UA: 5.5 (ref 5.0–8.0)

## 2021-03-14 MED ORDER — METRONIDAZOLE 500 MG PO TABS: 500.0000 mg | ORAL_TABLET | Freq: Two times a day (BID) | ORAL | 0 refills | Status: AC

## 2021-03-14 NOTE — ED Triage Notes (Signed)
Pt c/o lower abdominal cramping and lower back pain since Friday evening. States her stool is greenish to black in color. Last BM today around 1:30pm. Denies n/v.

## 2021-03-14 NOTE — Discharge Instructions (Addendum)
Your lab results will be back in normal days.  Go ahead and start Flagyl in case this is bacterial vaginosis.  That can give you uterine tenderness.  You do not have any evidence of pelvic inflammatory disease.  You may take 1000 mg of Tylenol 3-4 times a day as needed for pain.  Follow-up with your OB/GYN tomorrow.  Go immediately to the ER for pain not controlled with Tylenol, fevers above 100.4, or for any concerns.

## 2021-03-14 NOTE — ED Provider Notes (Signed)
HPI  SUBJECTIVE:  Jennifer [name] is a 46 y.o. female who presents with intermittent dull low midline abdominal pain for the past 3 days.  She states it lasts about a minute to minute and a half.  It does not migrate or radiate.  She states that it has not changed since it started.  She reports bilateral low back pain., nonodorous white vaginal discharge.  She had a bowel movement today with no change in her abdominal pain.  She has not been sexually active since December 21.  No nausea, vomiting, fevers, abdominal distention, diarrhea, constipation.  She states her bowel movement is black and green but she has been taking Pepto-Bismol.  No urinary symptoms, vaginal bleeding, odor, rash, anorexia.  States that the car ride over here was mildly painful.  She tried Pepto-Bismol, heating pad and Tylenol.  She took Tylenol within 6 hours of evaluation.  No aggravating or alleviating factors.  She denies COVID symptoms of headache, body aches, nasal congestion, rhinorrhea, sore throat, cough, shortness of breath.  She is status post multiple uterine fibroid removal and incisional hernia repair in her lower abdomen, but states that she has not noticed any bulging along the scars.  She has a past medical history of UTI, rheumatoid arthritis, hypertension, COVID in 2020.  No history of PID, STD, BV, pyelonephritis, nephrolithiasis, diabetes, TOA, PCOS, ovarian torsion.  She has mild to take ibuprofen as she has a knee replacement scheduled in 2 weeks.  LMP: Amenorrheic due to Mirena IUD.  ZOX:WRUEAV, Standley Brooking, MD     Past Medical History:  Diagnosis Date   Acute otitis media 08/28/2012   improved  change to liquid medication    Anxiety    Breast abscess of female    Recurrent   Depression    Family history of adverse reaction to anesthesia    father hard time waking once (12/20/2016)   Fibroids    w/bleeding   GERD (gastroesophageal reflux disease)    History of blood transfusion    after surgery    Hypertension    Migraines    Dr. Orie Rout; "I have a few/month" (12/20/2016)   Osteoarthritis    Pill esophagitis 08/28/2012   amoxicillin  by hx  disc plan nl voice except hoarse not drooling  close fu  if not getting better with plan stop aleve  liquid ibu onlyf necessary    Recurrent periodic urticaria    Rheumatoid arthritis ([place])    "55% of my body" (12/20/2016)   Rheumatoid arthritis ([place])    Sleep apnea    wears cpap    Past Surgical History:  Procedure Laterality Date   BREAST SURGERY     abcess under left breast    CYST EXCISION Left 2014   "elbow"   HERNIA REPAIR     INCISION AND DRAINAGE BREAST ABSCESS Left 2003   INCISIONAL HERNIA REPAIR N/A 12/18/2016   Procedure: REPAIR INCISIONAL HERNIA WITH MESH;  Surgeon: Georganna Skeans, MD;  Location: [facility];  Service: General;  Laterality: N/A;   INSERTION OF MESH N/A 12/18/2016   Procedure: INSERTION OF MESH;  Surgeon: Georganna Skeans, MD;  Location: [place];  Service: General;  Laterality: N/A;   KNEE ARTHROSCOPY WITH MENISCAL REPAIR Right 06/17/2018   Procedure: RIGHT KNEE ARTHROSCOPY WITH MENISCAL REPAIR;  Surgeon: Vickey Huger, MD;  Location: [facility];  Service: Orthopedics;  Laterality: Right;   MYOMECTOMY N/A 09/06/2016   Procedure: ABOMINAL MYOMECTOMY;  Surgeon: Governor Specking, MD;  Location: [facility];  Service: Gynecology;  Laterality: N/A;   UTERINE FIBROID SURGERY     34 fibroids    Family History  Problem Relation Age of Onset   Hypertension Mother    Rheum arthritis Mother    Hypertension Father    Sleep apnea Father     Social History   Tobacco Use   Smoking status: Never   Smokeless tobacco: Never  Vaping Use   Vaping Use: Never used  Substance Use Topics   Alcohol use: Yes    Comment: social   Drug use: Never    No current facility-administered medications for this encounter.  Current Outpatient Medications:    metroNIDAZOLE (FLAGYL) 500 MG tablet, Take 1 tablet (500 mg total) by mouth 2 (two)  times daily for 7 days., Disp: 14 tablet, Rfl: 0   amLODipine (NORVASC) 5 MG tablet, TAKE 1 TABLET BY MOUTH EVERY DAY (Patient taking differently: Take 5 mg by mouth daily.), Disp: 90 tablet, Rfl: 1   cetirizine (ZYRTEC) 10 MG tablet, Take 10 mg by mouth daily as needed for allergies., Disp: , Rfl:    cetirizine HCl (ZYRTEC) 1 MG/ML solution, Take 10 mLs (10 mg total) by mouth daily. (Patient not taking: Reported on 03/10/2021), Disp: 300 mL, Rfl: 0   diclofenac sodium (VOLTAREN) 1 % GEL, APPLY 4 G TOPICALLY 2 (TWO) TIMES DAILY. (Patient taking differently: Apply 2 g topically daily as needed (pain).), Disp: 400 g, Rfl: 3   Levonorgestrel (KYLEENA) 19.5 MG IUD, 19.5 mg by Intrauterine route once., Disp: , Rfl:    Melatonin 10 MG CAPS, Take 20 mg by mouth at bedtime., Disp: , Rfl:    NON FORMULARY, Pt uses cpap nightly, Disp: , Rfl:    prednisoLONE acetate (PRED FORTE) 1 % ophthalmic suspension, Place 1 drop into the left eye in the morning, at noon, and at bedtime., Disp: , Rfl:   Allergies  Allergen Reactions   Lisinopril Anaphylaxis   Lisinopril-Hydrochlorothiazide Swelling and Other (See Comments)    Angioedema  Has tolerated maxide in past so most likely  The ACE inhibitor as the cause      ROS  As noted in HPI.   Physical Exam  BP (!) 167/104 (BP Location: Left Arm)   Pulse 73   Temp 98.1 F (36.7 C) (Oral)   Resp 18   SpO2 96%   Constitutional: Well developed, well nourished, no acute distress Eyes:  EOMI, conjunctiva normal bilaterally HENT: Normocephalic, atraumatic,mucus membranes moist Respiratory: Normal inspiratory effort Cardiovascular: Normal rate GI: nondistended.  Soft.  Nondistended.  Active bowel sounds.  No rebound, guarding.  Positive suprapubic tenderness.  Negative McBurney.  No left lower quadrant tenderness.  Positive horizontal C-section scar, positive vertical incision immediately superior to the scar.  No appreciable bulging. Pelvic: Normal external  genitalia.  IUD strings visualized.  Positive extensive white thick nonodorous vaginal discharge.  Positive mild uterine tenderness.  No CMT.  No adnexal tenderness.  Chaperone present during exam skin: No rash, skin intact Musculoskeletal: no deformities Neurologic: Alert & oriented x 3, no focal neuro deficits Psychiatric: Speech and behavior appropriate   ED Course   Medications - No data to display  Orders Placed This Encounter  Procedures   POCT urinalysis dipstick    Standing Status:   Standing    Number of Occurrences:   1    Results for orders placed or performed during the hospital encounter of 03/14/21 (from the past 24 hour(s))  POCT urinalysis  dipstick     Status: Abnormal   Collection Time: 03/14/21  6:40 PM  Result Value Ref Range   Color, UA yellow yellow   Clarity, UA cloudy (A) clear   Glucose, UA negative negative mg/dL   Bilirubin, UA small (A) negative   Ketones, POC UA trace (5) (A) negative mg/dL   Spec Grav, UA >=1.030 (A) 1.010 - 1.025   Blood, UA negative negative   pH, UA 5.5 5.0 - 8.0   Protein Ur, POC =30 (A) negative mg/dL   Urobilinogen, UA 0.2 0.2 or 1.0 E.U./dL   Nitrite, UA Negative Negative   Leukocytes, UA Negative Negative   No results found.  ED Clinical Impression  1. Lower abdominal pain   2. Vaginal discharge      ED Assessment/Plan  Because patient is describing pain as "excruciating" offered to send her to the ED for comprehensive work-up including possible imaging.  Discussed with her that we were limited in our diagnostic capabilities at this facility.  She opted to do a limited work-up here, and try outpatient treatment.    Sending off BV, yeast.  Deferring STD testing as she has not been sexually active patient with suprapubic tenderness.  Suspect uterine cause.  The IUD appears to be in place.  There is no evidence of a surgical abdomen.  In the differential is incisional hernia, IUD migration.  I do not think that she has  uterine perforation at this time.  She does not have a UTI.  Suspect BV. will try treating with Flagyl and Tylenol.  She will call her OB/GYN tomorrow to be evaluated.  Strict ER return precautions given.  MDM, treatment plan, and plan for follow-up with patient. Discussed sn/sx that should prompt return to the ED. patient agrees with plan.   Meds ordered this encounter  Medications   metroNIDAZOLE (FLAGYL) 500 MG tablet    Sig: Take 1 tablet (500 mg total) by mouth 2 (two) times daily for 7 days.    Dispense:  14 tablet    Refill:  0       *This clinic note was created using [employer]. Therefore, there may be occasional mistakes despite careful proofreading.  ?    Melynda Ripple, MD 03/15/21 1745

## 2021-03-16 ENCOUNTER — Other Ambulatory Visit: Payer: Self-pay

## 2021-03-16 ENCOUNTER — Encounter (HOSPITAL_COMMUNITY)
Admission: RE | Admit: 2021-03-16 | Discharge: 2021-03-16 | Disposition: A | Discharge status: Home / Self Care | Payer: 59 | Source: Ambulatory Visit | Attending: Orthopedic Surgery | Admitting: Orthopedic Surgery

## 2021-03-16 ENCOUNTER — Encounter (HOSPITAL_COMMUNITY): Payer: Self-pay

## 2021-03-16 DIAGNOSIS — Z01812 Encounter for preprocedural laboratory examination: Secondary | ICD-10-CM | POA: Diagnosis present

## 2021-03-16 LAB — COMPREHENSIVE METABOLIC PANEL
ALT: 16 U/L (ref 0–44)
AST: 18 U/L (ref 15–41)
Albumin: 3.8 g/dL (ref 3.5–5.0)
Alkaline Phosphatase: 67 U/L (ref 38–126)
Anion gap: 5 (ref 5–15)
BUN: 17 mg/dL (ref 6–20)
CO2: 27 mmol/L (ref 22–32)
Calcium: 9.1 mg/dL (ref 8.9–10.3)
Chloride: 106 mmol/L (ref 98–111)
Creatinine, Ser: 0.85 mg/dL (ref 0.44–1.00)
GFR, Estimated: >60 mL/min (ref >60)
Glucose, Bld: 86 mg/dL (ref 70–99)
Potassium: 4.4 mmol/L (ref 3.5–5.1)
Sodium: 138 mmol/L (ref 135–145)
Total Bilirubin: 0.7 mg/dL (ref 0.3–1.2)
Total Protein: 7.8 g/dL (ref 6.5–8.1)

## 2021-03-16 LAB — CBC
HCT: 36.9 % (ref 36.0–46.0)
Hemoglobin: 11.9 g/dL — ABNORMAL LOW (ref 12.0–15.0)
MCH: 30.4 pg (ref 26.0–34.0)
MCHC: 32.2 g/dL (ref 30.0–36.0)
MCV: 94.1 fL (ref 80.0–100.0)
Platelets: 274 10*3/uL (ref 150–400)
RBC: 3.92 MIL/uL (ref 3.87–5.11)
RDW: 13.3 % (ref 11.5–15.5)
WBC: 4.1 10*3/uL (ref 4.0–10.5)
nRBC: 0 % (ref 0.0–0.2)

## 2021-03-16 LAB — CERVICOVAGINAL ANCILLARY ONLY
Bacterial Vaginitis (gardnerella): POSITIVE — AB
Candida Glabrata: NEGATIVE
Candida Vaginitis: NEGATIVE
Comment: NEGATIVE
Comment: NEGATIVE
Comment: NEGATIVE

## 2021-03-16 LAB — SURGICAL PCR SCREEN
MRSA, PCR: NEGATIVE
Staphylococcus aureus: NEGATIVE

## 2021-03-16 NOTE — Progress Notes (Signed)
DUE TO COVID-19 ONLY ONE VISITOR IS ALLOWED TO COME WITH YOU AND STAY IN THE WAITING ROOM ONLY DURING PRE OP AND PROCEDURE DAY OF SURGERY. THE 1 VISITOR  MAY VISIT WITH YOU AFTER SURGERY IN YOUR PRIVATE ROOM DURING VISITING HOURS ONLY!  YOU NEED TO HAVE A COVID 19 TEST ON__7/03/2021 _____ @_______ , THIS TEST MUST BE DONE BEFORE SURGERY,  COVID TESTING SITE [address] [place] [address], IT IS ON THE RIGHT GOING OUT [address] APPROXIMATELY  2 MINUTES PAST [employer] ON THE RIGHT. ONCE YOUR COVID TEST IS COMPLETED,  PLEASE BEGIN THE QUARANTINE INSTRUCTIONS AS OUTLINED IN YOUR HANDOUT.                Jennifer [name]  03/16/2021   Your procedure is scheduled on:  03/28/2021   Report to [facility] Main  Entrance   Report to admitting at    1010AM     Call this number if you have problems the morning of surgery [phone]    REMEMBER: NO  SOLID FOOD CANDY OR GUM AFTER MIDNIGHT. CLEAR LIQUIDS UNTIL      0940am        . NOTHING BY MOUTH EXCEPT CLEAR LIQUIDS UNTIL    0940am   . PLEASE FINISH ENSURE DRINK PER SURGEON ORDER  WHICH NEEDS TO BE COMPLETED AT   .0940 am    .      CLEAR LIQUID DIET   Foods Allowed                                                                    Coffee and tea, regular and decaf                            Fruit ices (not with fruit pulp)                                      Iced Popsicles                                    Carbonated beverages, regular and diet                                    Cranberry, grape and apple juices Sports drinks like Gatorade Lightly seasoned clear broth or consume(fat free) Sugar, honey syrup ___________________________________________________________________      BRUSH YOUR TEETH MORNING OF SURGERY AND RINSE YOUR MOUTH OUT, NO CHEWING GUM CANDY OR MINTS.     Take these medicines the morning of surgery with A SIP OF WATER: amlodipine, eye drops as usual   DO NOT TAKE ANY DIABETIC  MEDICATIONS DAY OF YOUR SURGERY                               You may not have any metal on your body including hair pins and  piercings  Do not wear jewelry, make-up, lotions, powders or perfumes, deodorant             Do not wear nail polish on your fingernails.  Do not shave  48 hours prior to surgery.              Men may shave face and neck.   Do not bring valuables to the hospital. [place].  Contacts, dentures or bridgework may not be worn into surgery.  Leave suitcase in the car. After surgery it may be brought to your room.     Patients discharged the day of surgery will not be allowed to drive home. IF YOU ARE HAVING SURGERY AND GOING HOME THE SAME DAY, YOU MUST HAVE AN ADULT TO DRIVE YOU HOME AND BE WITH YOU FOR 24 HOURS. YOU MAY GO HOME BY TAXI OR UBER OR ORTHERWISE, BUT AN ADULT MUST ACCOMPANY YOU HOME AND STAY WITH YOU FOR 24 HOURS.  Name and phone number of your driver:  Special Instructions: N/A              Please read over the following fact sheets you were given: _____________________________________________________________________  [facility] - Preparing for Surgery Before surgery, you can play an important role.  Because skin is not sterile, your skin needs to be as free of germs as possible.  You can reduce the number of germs on your skin by washing with CHG (chlorahexidine gluconate) soap before surgery.  CHG is an antiseptic cleaner which kills germs and bonds with the skin to continue killing germs even after washing. Please DO NOT use if you have an allergy to CHG or antibacterial soaps.  If your skin becomes reddened/irritated stop using the CHG and inform your nurse when you arrive at Short Stay. Do not shave (including legs and underarms) for at least 48 hours prior to the first CHG shower.  You may shave your face/neck. Please follow these instructions carefully:  1.  Shower with CHG Soap the night  before surgery and the  morning of Surgery.  2.  If you choose to wash your hair, wash your hair first as usual with your  normal  shampoo.  3.  After you shampoo, rinse your hair and body thoroughly to remove the  shampoo.                           4.  Use CHG as you would any other liquid soap.  You can apply chg directly  to the skin and wash                       Gently with a scrungie or clean washcloth.  5.  Apply the CHG Soap to your body ONLY FROM THE NECK DOWN.   Do not use on face/ open                           Wound or open sores. Avoid contact with eyes, ears mouth and genitals (private parts).                       Wash face,  Genitals (private parts) with your normal soap.             6.  Wash thoroughly, paying special attention to the area where your surgery  will be performed.  7.  Thoroughly rinse your body with warm water from the neck down.  8.  DO NOT shower/wash with your normal soap after using and rinsing off  the CHG Soap.                9.  Pat yourself dry with a clean towel.            10.  Wear clean pajamas.            11.  Place clean sheets on your bed the night of your first shower and do not  sleep with pets. Day of Surgery : Do not apply any lotions/deodorants the morning of surgery.  Please wear clean clothes to the hospital/surgery center.  FAILURE TO FOLLOW THESE INSTRUCTIONS MAY RESULT IN THE CANCELLATION OF YOUR SURGERY PATIENT SIGNATURE_________________________________  NURSE SIGNATURE__________________________________  ________________________________________________________________________

## 2021-03-16 NOTE — Progress Notes (Addendum)
Anesthesia Review:  PCP: DR Shanon Ace Clearance on chart dated 02/28/21 LOV 02/28/21  Cardiologist : DR Kathleen Lime LOV 10/14/20  Chest x-ray : EKG : 10/14/20 requested 12 lead ekg tracing by fax -10/14/20 ekg on chart  Echo : Stress test: Cardiac Cath :  Activity level: can do a flight of stairs without difficulty  Sleep Study/ CPAP : yes  Fasting Blood Sugar :      / Checks Blood Sugar -- times a day:   Blood Thinner/ Instructions /Last Dose: ASA / Instructions/ Last Dose :   In ED 03/14/21 for lwoer abdominal pain placed on Flagyl pt has not yet started at time of preop appt .  PT reports she was unsure if she should take or not.  Instructed pt to contact Dr Wynelle Link in regards to this.  PT voiced understanding.  Obtained CBC and the CMP since had ED visit on 03/14/21.   02/28/21- cbc/diff, hgbaic-5.7, bmp., u/a amd pt done

## 2021-03-22 ENCOUNTER — Other Ambulatory Visit (HOSPITAL_COMMUNITY): Payer: 59

## 2021-03-22 ENCOUNTER — Telehealth: Payer: Self-pay | Admitting: Rheumatology

## 2021-03-22 ENCOUNTER — Telehealth: Payer: Self-pay | Admitting: *Deleted

## 2021-03-22 NOTE — Telephone Encounter (Signed)
Patient calling to see if she can proceed with Visco injections for left knee?

## 2021-03-22 NOTE — Telephone Encounter (Signed)
Patient contacted the office and states she is due to have surgery on 03/28/2021. Patient wanted to schedule her Cimzia injections. Patient advised we would need a clearance letter from her surgeon after the surgery before we can schedule the appointment. Patient expressed understanding. Patient advised advised she is due to update her TB Gold and she states she will come in this week to update.

## 2021-03-22 NOTE — Telephone Encounter (Signed)
Ok to apply for visco injections for the left knee. We will update x-rays at the first injection visit.   Please clarify when she is planning on having her right knee replaced?

## 2021-03-22 NOTE — Telephone Encounter (Signed)
Patient has right knee replacement scheduled for 03/28/2021.

## 2021-03-23 ENCOUNTER — Other Ambulatory Visit: Payer: Self-pay | Admitting: *Deleted

## 2021-03-23 DIAGNOSIS — Z79899 Other long term (current) drug therapy: Secondary | ICD-10-CM

## 2021-03-24 ENCOUNTER — Other Ambulatory Visit (HOSPITAL_COMMUNITY)
Admission: RE | Admit: 2021-03-24 | Discharge: 2021-03-24 | Disposition: A | Discharge status: Home / Self Care | Payer: 59 | Source: Ambulatory Visit | Attending: Orthopedic Surgery | Admitting: Orthopedic Surgery

## 2021-03-24 DIAGNOSIS — Z01812 Encounter for preprocedural laboratory examination: Secondary | ICD-10-CM | POA: Insufficient documentation

## 2021-03-24 DIAGNOSIS — Z20822 Contact with and (suspected) exposure to covid-19: Secondary | ICD-10-CM | POA: Diagnosis not present

## 2021-03-24 LAB — SARS CORONAVIRUS 2 (TAT 6-24 HRS): SARS Coronavirus 2: NEGATIVE

## 2021-03-25 LAB — QUANTIFERON-TB GOLD PLUS
Mitogen-NIL: >10 IU/mL
NIL: 0.06 IU/mL
QuantiFERON-TB Gold Plus: NEGATIVE
TB1-NIL: <0 IU/mL
TB2-NIL: <0 IU/mL

## 2021-03-25 NOTE — Telephone Encounter (Signed)
Submitted for VOB 03/25/2021.

## 2021-03-27 MED ORDER — BUPIVACAINE LIPOSOME 1.3 % IJ SUSP
20.0000 mL | INTRAMUSCULAR | Status: DC
  Filled 2021-03-27: qty 20

## 2021-03-28 ENCOUNTER — Ambulatory Visit (HOSPITAL_COMMUNITY): Payer: 59 | Admitting: Anesthesiology

## 2021-03-28 ENCOUNTER — Observation Stay (HOSPITAL_COMMUNITY)
Admission: RE | Admit: 2021-03-28 | Discharge: 2021-03-30 | Disposition: A | Discharge status: Home / Self Care | Payer: 59 | Source: Ambulatory Visit | Attending: Orthopedic Surgery | Admitting: Orthopedic Surgery

## 2021-03-28 ENCOUNTER — Other Ambulatory Visit: Payer: Self-pay

## 2021-03-28 ENCOUNTER — Encounter (HOSPITAL_COMMUNITY): Payer: Self-pay | Admitting: Orthopedic Surgery

## 2021-03-28 ENCOUNTER — Ambulatory Visit (HOSPITAL_COMMUNITY): Payer: 59 | Admitting: Physician Assistant

## 2021-03-28 ENCOUNTER — Encounter (HOSPITAL_COMMUNITY)
Admission: RE | Disposition: A | Discharge status: Home / Self Care | Payer: Self-pay | Source: Ambulatory Visit | Attending: Orthopedic Surgery

## 2021-03-28 DIAGNOSIS — M1710 Unilateral primary osteoarthritis, unspecified knee: Secondary | ICD-10-CM | POA: Diagnosis present

## 2021-03-28 DIAGNOSIS — M179 Osteoarthritis of knee, unspecified: Secondary | ICD-10-CM | POA: Diagnosis present

## 2021-03-28 DIAGNOSIS — Z79899 Other long term (current) drug therapy: Secondary | ICD-10-CM | POA: Insufficient documentation

## 2021-03-28 DIAGNOSIS — M1711 Unilateral primary osteoarthritis, right knee: Principal | ICD-10-CM | POA: Insufficient documentation

## 2021-03-28 DIAGNOSIS — I10 Essential (primary) hypertension: Secondary | ICD-10-CM | POA: Diagnosis not present

## 2021-03-28 HISTORY — PX: TOTAL KNEE ARTHROPLASTY: SHX125

## 2021-03-28 LAB — PREGNANCY, URINE: Preg Test, Ur: NEGATIVE

## 2021-03-28 SURGERY — ARTHROPLASTY, KNEE, TOTAL
Anesthesia: Spinal | Site: Knee | Laterality: Right

## 2021-03-28 MED ORDER — LACTATED RINGERS IV SOLN: INTRAVENOUS | Status: DC

## 2021-03-28 MED ORDER — CEFAZOLIN SODIUM-DEXTROSE 2-4 GM/100ML-% IV SOLN
2.0000 g | INTRAVENOUS | Status: AC
  Administered 2021-03-28: 2 g via INTRAVENOUS
  Filled 2021-03-28: qty 100

## 2021-03-28 MED ORDER — DEXAMETHASONE SODIUM PHOSPHATE 10 MG/ML IJ SOLN
INTRAMUSCULAR | Status: DC | PRN
  Administered 2021-03-28: 8 mg via INTRAVENOUS

## 2021-03-28 MED ORDER — SODIUM CHLORIDE 0.9 % IV SOLN: INTRAVENOUS | Status: DC

## 2021-03-28 MED ORDER — BUPIVACAINE LIPOSOME 1.3 % IJ SUSP
INTRAMUSCULAR | Status: DC | PRN
  Administered 2021-03-28: 20 mL

## 2021-03-28 MED ORDER — FENTANYL CITRATE (PF) 100 MCG/2ML IJ SOLN
50.0000 ug | INTRAMUSCULAR | Status: AC
  Administered 2021-03-28: 100 ug via INTRAVENOUS
  Filled 2021-03-28: qty 2

## 2021-03-28 MED ORDER — ONDANSETRON HCL 4 MG/2ML IJ SOLN: 4.0000 mg | Freq: Four times a day (QID) | INTRAMUSCULAR | Status: DC | PRN

## 2021-03-28 MED ORDER — TRANEXAMIC ACID-NACL 1000-0.7 MG/100ML-% IV SOLN
1000.0000 mg | INTRAVENOUS | Status: AC
  Administered 2021-03-28: 1000 mg via INTRAVENOUS
  Filled 2021-03-28: qty 100

## 2021-03-28 MED ORDER — PROMETHAZINE HCL 25 MG/ML IJ SOLN: 6.2500 mg | INTRAMUSCULAR | Status: DC | PRN

## 2021-03-28 MED ORDER — ONDANSETRON HCL 4 MG/2ML IJ SOLN
INTRAMUSCULAR | Status: AC
  Filled 2021-03-28: qty 2

## 2021-03-28 MED ORDER — DIPHENHYDRAMINE HCL 12.5 MG/5ML PO ELIX: 12.5000 mg | ORAL_SOLUTION | ORAL | Status: DC | PRN

## 2021-03-28 MED ORDER — ONDANSETRON HCL 4 MG/2ML IJ SOLN
INTRAMUSCULAR | Status: DC | PRN
  Administered 2021-03-28: 4 mg via INTRAVENOUS

## 2021-03-28 MED ORDER — BISACODYL 10 MG RE SUPP: 10.0000 mg | Freq: Every day | RECTAL | Status: DC | PRN

## 2021-03-28 MED ORDER — METHOCARBAMOL 500 MG IVPB - SIMPLE MED
500.0000 mg | Freq: Four times a day (QID) | INTRAVENOUS | Status: DC | PRN
  Filled 2021-03-28: qty 50

## 2021-03-28 MED ORDER — METHOCARBAMOL 500 MG PO TABS
500.0000 mg | ORAL_TABLET | Freq: Four times a day (QID) | ORAL | Status: DC | PRN
  Administered 2021-03-28 – 2021-03-30 (×6): 500 mg via ORAL
  Filled 2021-03-28 (×6): qty 1

## 2021-03-28 MED ORDER — ASPIRIN 325 MG PO TABS
325.0000 mg | ORAL_TABLET | Freq: Two times a day (BID) | ORAL | Status: DC
  Administered 2021-03-29 – 2021-03-30 (×3): 325 mg via ORAL
  Filled 2021-03-28 (×3): qty 1

## 2021-03-28 MED ORDER — OXYCODONE HCL 5 MG PO TABS
5.0000 mg | ORAL_TABLET | ORAL | Status: DC | PRN
  Administered 2021-03-28 (×2): 5 mg via ORAL
  Administered 2021-03-29 – 2021-03-30 (×7): 10 mg via ORAL
  Filled 2021-03-28: qty 1
  Filled 2021-03-28: qty 2
  Filled 2021-03-28: qty 1
  Filled 2021-03-28 (×7): qty 2

## 2021-03-28 MED ORDER — ROPIVACAINE HCL 7.5 MG/ML IJ SOLN
INTRAMUSCULAR | Status: DC | PRN
  Administered 2021-03-28: 20 mL via PERINEURAL

## 2021-03-28 MED ORDER — AMLODIPINE BESYLATE 5 MG PO TABS
5.0000 mg | ORAL_TABLET | Freq: Every day | ORAL | Status: DC
  Administered 2021-03-29 – 2021-03-30 (×2): 5 mg via ORAL
  Filled 2021-03-28 (×2): qty 1

## 2021-03-28 MED ORDER — PROPOFOL 10 MG/ML IV BOLUS
INTRAVENOUS | Status: AC
  Filled 2021-03-28: qty 20

## 2021-03-28 MED ORDER — POVIDONE-IODINE 10 % EX SWAB
2.0000 "application " | Freq: Once | CUTANEOUS | Status: AC
  Administered 2021-03-28: 2 via TOPICAL

## 2021-03-28 MED ORDER — MEPERIDINE HCL 50 MG/ML IJ SOLN: 6.2500 mg | INTRAMUSCULAR | Status: DC | PRN

## 2021-03-28 MED ORDER — PHENYLEPHRINE HCL-NACL 10-0.9 MG/250ML-% IV SOLN
INTRAVENOUS | Status: DC | PRN
  Administered 2021-03-28: 25 ug/min via INTRAVENOUS

## 2021-03-28 MED ORDER — POLYETHYLENE GLYCOL 3350 17 G PO PACK: 17.0000 g | PACK | Freq: Every day | ORAL | Status: DC | PRN

## 2021-03-28 MED ORDER — PROPOFOL 500 MG/50ML IV EMUL
INTRAVENOUS | Status: AC
  Filled 2021-03-28: qty 50

## 2021-03-28 MED ORDER — GABAPENTIN 300 MG PO CAPS
300.0000 mg | ORAL_CAPSULE | Freq: Three times a day (TID) | ORAL | Status: DC
  Administered 2021-03-28 – 2021-03-30 (×6): 300 mg via ORAL
  Filled 2021-03-28 (×6): qty 1

## 2021-03-28 MED ORDER — DEXAMETHASONE SODIUM PHOSPHATE 10 MG/ML IJ SOLN
10.0000 mg | Freq: Once | INTRAMUSCULAR | Status: AC
  Administered 2021-03-29: 10 mg via INTRAVENOUS
  Filled 2021-03-28: qty 1

## 2021-03-28 MED ORDER — ACETAMINOPHEN 500 MG PO TABS
1000.0000 mg | ORAL_TABLET | Freq: Four times a day (QID) | ORAL | Status: AC
  Administered 2021-03-28 – 2021-03-29 (×4): 1000 mg via ORAL
  Filled 2021-03-28 (×4): qty 2

## 2021-03-28 MED ORDER — PREDNISOLONE ACETATE 1 % OP SUSP
1.0000 [drp] | Freq: Four times a day (QID) | OPHTHALMIC | Status: DC
  Administered 2021-03-28 – 2021-03-29 (×6): 1 [drp] via OPHTHALMIC
  Filled 2021-03-28: qty 5

## 2021-03-28 MED ORDER — LIDOCAINE 2% (20 MG/ML) 5 ML SYRINGE
INTRAMUSCULAR | Status: DC | PRN
  Administered 2021-03-28: 60 mg via INTRAVENOUS

## 2021-03-28 MED ORDER — ONDANSETRON HCL 4 MG PO TABS: 4.0000 mg | ORAL_TABLET | Freq: Four times a day (QID) | ORAL | Status: DC | PRN

## 2021-03-28 MED ORDER — PHENOL 1.4 % MT LIQD: 1.0000 | OROMUCOSAL | Status: DC | PRN

## 2021-03-28 MED ORDER — OXYCODONE HCL 5 MG/5ML PO SOLN: 5.0000 mg | Freq: Once | ORAL | Status: DC | PRN

## 2021-03-28 MED ORDER — SODIUM CHLORIDE 0.9 % IV SOLN
2.0000 g | Freq: Four times a day (QID) | INTRAVENOUS | Status: AC
  Administered 2021-03-28 – 2021-03-29 (×2): 2 g via INTRAVENOUS
  Filled 2021-03-28 (×2): qty 2

## 2021-03-28 MED ORDER — BUPIVACAINE IN DEXTROSE 0.75-8.25 % IT SOLN
INTRATHECAL | Status: DC | PRN
  Administered 2021-03-28: 12 mg via INTRATHECAL

## 2021-03-28 MED ORDER — CHLORHEXIDINE GLUCONATE 0.12 % MT SOLN
15.0000 mL | Freq: Once | OROMUCOSAL | Status: AC
  Administered 2021-03-28: 15 mL via OROMUCOSAL

## 2021-03-28 MED ORDER — DOCUSATE SODIUM 100 MG PO CAPS
100.0000 mg | ORAL_CAPSULE | Freq: Two times a day (BID) | ORAL | Status: DC
  Administered 2021-03-28 – 2021-03-30 (×4): 100 mg via ORAL
  Filled 2021-03-28 (×4): qty 1

## 2021-03-28 MED ORDER — OXYCODONE HCL 5 MG PO TABS: 5.0000 mg | ORAL_TABLET | Freq: Once | ORAL | Status: DC | PRN

## 2021-03-28 MED ORDER — DEXAMETHASONE SODIUM PHOSPHATE 10 MG/ML IJ SOLN: 8.0000 mg | Freq: Once | INTRAMUSCULAR | Status: DC

## 2021-03-28 MED ORDER — TRAMADOL HCL 50 MG PO TABS
50.0000 mg | ORAL_TABLET | Freq: Four times a day (QID) | ORAL | Status: DC | PRN
  Administered 2021-03-28 – 2021-03-29 (×2): 100 mg via ORAL
  Filled 2021-03-28 (×2): qty 2

## 2021-03-28 MED ORDER — SODIUM CHLORIDE (PF) 0.9 % IJ SOLN
INTRAMUSCULAR | Status: AC
  Filled 2021-03-28: qty 10

## 2021-03-28 MED ORDER — METOCLOPRAMIDE HCL 5 MG/ML IJ SOLN: 5.0000 mg | Freq: Three times a day (TID) | INTRAMUSCULAR | Status: DC | PRN

## 2021-03-28 MED ORDER — PROPOFOL 10 MG/ML IV BOLUS
INTRAVENOUS | Status: DC | PRN
  Administered 2021-03-28: 20 mg via INTRAVENOUS
  Administered 2021-03-28: 10 mg via INTRAVENOUS

## 2021-03-28 MED ORDER — ACETAMINOPHEN 10 MG/ML IV SOLN
1000.0000 mg | Freq: Four times a day (QID) | INTRAVENOUS | Status: DC
  Administered 2021-03-28: 1000 mg via INTRAVENOUS
  Filled 2021-03-28: qty 100

## 2021-03-28 MED ORDER — MIDAZOLAM HCL 2 MG/2ML IJ SOLN
0.5000 mg | Freq: Once | INTRAMUSCULAR | Status: DC | PRN
Start: 2021-03-28 — End: 2021-03-28

## 2021-03-28 MED ORDER — MIDAZOLAM HCL 2 MG/2ML IJ SOLN
1.0000 mg | INTRAMUSCULAR | Status: AC
  Administered 2021-03-28: 2 mg via INTRAVENOUS
  Filled 2021-03-28: qty 2

## 2021-03-28 MED ORDER — HYDROMORPHONE HCL 1 MG/ML IJ SOLN
0.2500 mg | INTRAMUSCULAR | Status: DC | PRN
Start: 2021-03-28 — End: 2021-03-28

## 2021-03-28 MED ORDER — ORAL CARE MOUTH RINSE: 15.0000 mL | Freq: Once | OROMUCOSAL | Status: AC

## 2021-03-28 MED ORDER — MORPHINE SULFATE (PF) 2 MG/ML IV SOLN
0.5000 mg | INTRAVENOUS | Status: DC | PRN
  Administered 2021-03-28 – 2021-03-29 (×2): 1 mg via INTRAVENOUS
  Filled 2021-03-28 (×2): qty 1

## 2021-03-28 MED ORDER — PROPOFOL 500 MG/50ML IV EMUL
INTRAVENOUS | Status: DC | PRN
  Administered 2021-03-28: 75 ug/kg/min via INTRAVENOUS

## 2021-03-28 MED ORDER — MENTHOL 3 MG MT LOZG: 1.0000 | LOZENGE | OROMUCOSAL | Status: DC | PRN

## 2021-03-28 MED ORDER — SODIUM CHLORIDE (PF) 0.9 % IJ SOLN
INTRAMUSCULAR | Status: DC | PRN
  Administered 2021-03-28: 60 mL

## 2021-03-28 MED ORDER — SODIUM CHLORIDE 0.9 % IR SOLN
Status: DC | PRN
  Administered 2021-03-28 (×2): 1000 mL

## 2021-03-28 MED ORDER — FLEET ENEMA 7-19 GM/118ML RE ENEM: 1.0000 | ENEMA | Freq: Once | RECTAL | Status: DC | PRN

## 2021-03-28 MED ORDER — METOCLOPRAMIDE HCL 5 MG PO TABS: 5.0000 mg | ORAL_TABLET | Freq: Three times a day (TID) | ORAL | Status: DC | PRN

## 2021-03-28 SURGICAL SUPPLY — 53 items
ATTUNE MED DOME PAT 38 KNEE (Knees) ×2 IMPLANT
ATTUNE PS FEM RT SZ 5 CEM KNEE (Femur) ×2 IMPLANT
ATTUNE PSRP INSR SZ5 8 KNEE (Insert) ×2 IMPLANT
BAG COUNTER SPONGE SURGICOUNT (BAG) IMPLANT
BAG DECANTER FOR FLEXI CONT (MISCELLANEOUS) ×2 IMPLANT
BAG ZIPLOCK 12X15 (MISCELLANEOUS) ×2 IMPLANT
BASEPLATE TIBIAL ROTATING SZ 4 (Knees) ×2 IMPLANT
BLADE SAG 18X100X1.27 (BLADE) ×2 IMPLANT
BLADE SAW SGTL 11.0X1.19X90.0M (BLADE) ×2 IMPLANT
BNDG ELASTIC 6X5.8 VLCR STR LF (GAUZE/BANDAGES/DRESSINGS) ×2 IMPLANT
BOWL SMART MIX CTS (DISPOSABLE) ×2 IMPLANT
CEMENT HV SMART SET (Cement) ×4 IMPLANT
COVER SURGICAL LIGHT HANDLE (MISCELLANEOUS) ×2 IMPLANT
CUFF TOURN SGL QUICK 34 (TOURNIQUET CUFF) ×2
CUFF TRNQT CYL 34X4.125X (TOURNIQUET CUFF) ×1 IMPLANT
DECANTER SPIKE VIAL GLASS SM (MISCELLANEOUS) ×2 IMPLANT
DRAPE U-SHAPE 47X51 STRL (DRAPES) ×2 IMPLANT
DRSG AQUACEL AG ADV 3.5X10 (GAUZE/BANDAGES/DRESSINGS) ×2 IMPLANT
DURAPREP 26ML APPLICATOR (WOUND CARE) ×2 IMPLANT
ELECT REM PT RETURN 15FT ADLT (MISCELLANEOUS) ×2 IMPLANT
GLOVE SRG 8 PF TXTR STRL LF DI (GLOVE) ×1 IMPLANT
GLOVE SURG ENC MOIS LTX SZ6.5 (GLOVE) ×2 IMPLANT
GLOVE SURG ENC MOIS LTX SZ8 (GLOVE) ×4 IMPLANT
GLOVE SURG UNDER POLY LF SZ7 (GLOVE) ×2 IMPLANT
GLOVE SURG UNDER POLY LF SZ8 (GLOVE) ×2
GLOVE SURG UNDER POLY LF SZ8.5 (GLOVE) ×2 IMPLANT
GOWN STRL REUS W/TWL LRG LVL3 (GOWN DISPOSABLE) ×4 IMPLANT
GOWN STRL REUS W/TWL XL LVL3 (GOWN DISPOSABLE) ×2 IMPLANT
HANDPIECE INTERPULSE COAX TIP (DISPOSABLE) ×2
HOLDER FOLEY CATH W/STRAP (MISCELLANEOUS) ×2 IMPLANT
IMMOBILIZER KNEE 20 (SOFTGOODS) ×2
IMMOBILIZER KNEE 20 THIGH 36 (SOFTGOODS) ×1 IMPLANT
KIT TURNOVER KIT A (KITS) ×2 IMPLANT
MANIFOLD NEPTUNE II (INSTRUMENTS) ×2 IMPLANT
NS IRRIG 1000ML POUR BTL (IV SOLUTION) ×2 IMPLANT
PACK TOTAL KNEE CUSTOM (KITS) ×2 IMPLANT
PADDING CAST COTTON 6X4 STRL (CAST SUPPLIES) ×2 IMPLANT
PENCIL SMOKE EVACUATOR (MISCELLANEOUS) ×2 IMPLANT
PIN DRILL FIX HALF THREAD (BIT) ×2 IMPLANT
PIN STEINMAN FIXATION KNEE (PIN) ×2 IMPLANT
PROTECTOR NERVE ULNAR (MISCELLANEOUS) ×2 IMPLANT
SET HNDPC FAN SPRY TIP SCT (DISPOSABLE) ×1 IMPLANT
STRIP CLOSURE SKIN 1/2X4 (GAUZE/BANDAGES/DRESSINGS) ×4 IMPLANT
SUT MNCRL AB 4-0 PS2 18 (SUTURE) ×2 IMPLANT
SUT STRATAFIX 0 PDS 27 VIOLET (SUTURE) ×2
SUT VIC AB 2-0 CT1 27 (SUTURE) ×6
SUT VIC AB 2-0 CT1 TAPERPNT 27 (SUTURE) ×3 IMPLANT
SUTURE STRATFX 0 PDS 27 VIOLET (SUTURE) ×1 IMPLANT
TRAY FOLEY MTR SLVR 14FR STAT (SET/KITS/TRAYS/PACK) ×2 IMPLANT
TRAY FOLEY MTR SLVR 16FR STAT (SET/KITS/TRAYS/PACK) IMPLANT
TUBE SUCTION HIGH CAP CLEAR NV (SUCTIONS) ×2 IMPLANT
WATER STERILE IRR 1000ML POUR (IV SOLUTION) ×4 IMPLANT
WRAP KNEE MAXI GEL POST OP (GAUZE/BANDAGES/DRESSINGS) ×2 IMPLANT

## 2021-03-28 NOTE — Anesthesia Preprocedure Evaluation (Addendum)
Anesthesia Evaluation  Patient identified by MRN, date of birth, ID band Patient awake    Reviewed: Allergy & Precautions, NPO status , Patient's Chart, lab work & pertinent test results  History of Anesthesia Complications Negative for: history of anesthetic complications  Airway Mallampati: II  TM Distance: >3 FB Neck ROM: Full    Dental  (+) Dental Advisory Given   Pulmonary sleep apnea and Continuous Positive Airway Pressure Ventilation ,  03/24/2021 SARS coronavirus NEG   breath sounds clear to auscultation       Cardiovascular hypertension, Pt. on medications (-) angina Rhythm:Regular Rate:Normal     Neuro/Psych  Headaches,    GI/Hepatic negative GI ROS, Neg liver ROS,   Endo/Other  Morbid obesity  Renal/GU negative Renal ROS     Musculoskeletal  (+) Arthritis , Rheumatoid disorders,    Abdominal (+) + obese,   Peds  Hematology negative hematology ROS (+)   Anesthesia Other Findings   Reproductive/Obstetrics                            Anesthesia Physical Anesthesia Plan  ASA: 3  Anesthesia Plan: Spinal   Post-op Pain Management:  Regional for Post-op pain   Induction:   PONV Risk Score and Plan: 2 and Ondansetron and Treatment may vary due to age or medical condition  Airway Management Planned: Natural Airway and Simple Face Mask  Additional Equipment:   Intra-op Plan:   Post-operative Plan:   Informed Consent: I have reviewed the patients History and Physical, chart, labs and discussed the procedure including the risks, benefits and alternatives for the proposed anesthesia with the patient or authorized representative who has indicated his/her understanding and acceptance.     Dental advisory given  Plan Discussed with: CRNA and Surgeon  Anesthesia Plan Comments: (Plan SAB with adductor canal block for post op analgesia)       Anesthesia Quick Evaluation

## 2021-03-28 NOTE — H&P (Signed)
TOTAL KNEE ADMISSION H&P  Patient is being admitted for right total knee arthroplasty.  Subjective:  Chief Complaint: Right knee pain.  HPI: Jennifer [name], 45 y.o. female has a history of pain and functional disability in the right knee due to arthritis and has failed non-surgical conservative treatments for greater than 12 weeks to include NSAID's and/or analgesics and activity modification. Onset of symptoms was gradual, starting  several  years ago with gradually worsening course since that time. The patient noted prior procedures on the knee to include  arthroscopy on the right knee.  Patient currently rates pain in the right knee at 7 out of 10 with activity. Patient has worsening of pain with activity and weight bearing, pain that interferes with activities of daily living, and pain with passive range of motion. Patient has evidence of periarticular osteophytes and joint space narrowing by imaging studies. There is no active infection.  Patient Active Problem List   Diagnosis Date Noted   Osteoarthritis of knee 04/02/2020   Pain in right knee 01/13/2020   Chronic pain of right knee 01/21/2019   S/P right knee arthroscopy 07/23/2018   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   Primary osteoarthritis of both hands 02/04/2018   Sleep apnea, obstructive 09/28/2017   Primary osteoarthritis of both knees 05/22/2017   Incisional hernia 12/18/2016   Leiomyoma of uterus 09/06/2016   Fibroid, uterine    Essential hypertension 05/25/2015   Recurrent headache 05/25/2015   Head lump 07/31/2014   Edema 12/29/2013   Baker's cyst, ruptured 12/25/2013   Cough, persistent 12/12/2013   Left leg swelling 12/12/2013   Cough 12/12/2013   High risk medication use 12/12/2013   Recurrent periodic urticaria    Rheumatoid arthritis ([place]) 08/28/2012   CHRONIC LARYNGITIS 11/03/2010   ALLERGIC RHINITIS 11/03/2010   PARESTHESIA 07/28/2010   DYSMENORRHEA 10/15/2007   FIBROIDS, UTERUS 07/24/2007   OBESITY  07/24/2007   SLEEPLESSNESS 07/24/2007   HEADACHE 07/24/2007    Past Medical History:  Diagnosis Date   Acute otitis media 08/28/2012   improved  change to liquid medication    Breast abscess of female    Recurrent   Family history of adverse reaction to anesthesia    father hard time waking once (12/20/2016)   Fibroids    w/bleeding   GERD (gastroesophageal reflux disease)    History of blood transfusion    after surgery   Hypertension    Migraines    Dr. Orie Rout; "I have a few/month" (12/20/2016)   Osteoarthritis    Pill esophagitis 08/28/2012   amoxicillin  by hx  disc plan nl voice except hoarse not drooling  close fu  if not getting better with plan stop aleve  liquid ibu onlyf necessary    Recurrent periodic urticaria    Rheumatoid arthritis ([place])    "55% of my body" (12/20/2016)   Rheumatoid arthritis ([place])    Sleep apnea    wears cpap    Past Surgical History:  Procedure Laterality Date   BREAST SURGERY     abcess under left breast    CYST EXCISION Left 2014   "elbow"   HERNIA REPAIR     INCISION AND DRAINAGE BREAST ABSCESS Left 2003   INCISIONAL HERNIA REPAIR N/A 12/18/2016   Procedure: REPAIR INCISIONAL HERNIA WITH MESH;  Surgeon: Georganna Skeans, MD;  Location: [facility];  Service: General;  Laterality: N/A;   INSERTION OF MESH N/A 12/18/2016   Procedure: INSERTION OF MESH;  Surgeon: Georganna Skeans, MD;  Location: [facility];  Service: General;  Laterality: N/A;   KNEE ARTHROSCOPY WITH MENISCAL REPAIR Right 06/17/2018   Procedure: RIGHT KNEE ARTHROSCOPY WITH MENISCAL REPAIR;  Surgeon: Vickey Huger, MD;  Location: [facility];  Service: Orthopedics;  Laterality: Right;   MYOMECTOMY N/A 09/06/2016   Procedure: Lendell Caprice;  Surgeon: Governor Specking, MD;  Location: [facility];  Service: Gynecology;  Laterality: N/A;   UTERINE FIBROID SURGERY     35 fibroids    Prior to Admission medications   Medication Sig Start Date End Date Taking? Authorizing Provider  amLODipine  (NORVASC) 5 MG tablet TAKE 1 TABLET BY MOUTH EVERY DAY Patient taking differently: Take 5 mg by mouth daily. Takes in the pm 02/24/19  Yes Panosh, Standley Brooking, MD  cetirizine (ZYRTEC) 10 MG tablet Take 10 mg by mouth daily as needed for allergies.   Yes [provider]  diclofenac sodium (VOLTAREN) 1 % GEL APPLY 4 G TOPICALLY 2 (TWO) TIMES DAILY. Patient taking differently: Apply 2 g topically daily as needed (pain). 06/23/19  Yes Deveshwar, Abel Presto, MD  Levonorgestrel (KYLEENA) 19.5 MG IUD 19.5 mg by Intrauterine route once.   Yes [provider]  Melatonin 10 MG CAPS Take 20 mg by mouth at bedtime.   Yes [provider]  prednisoLONE acetate (PRED FORTE) 1 % ophthalmic suspension Place 1 drop into the left eye in the morning, at noon, and at bedtime. 4 x daily per pt on 03/16/21 02/25/21  Yes [provider]  cetirizine HCl (ZYRTEC) 1 MG/ML solution Take 10 mLs (10 mg total) by mouth daily. Patient not taking: Reported on 03/10/2021 12/04/20   Wieters, Hallie C, PA-C  NON FORMULARY Pt uses cpap nightly    [provider]  CIMZIA 2 X 200 MG KIT INJECT 400 MG UNDER THE SKIN EVERY 28 (TWENTY-EIGHT) DAYS 07/08/18 12/04/20  Bo Merino, MD  eletriptan (RELPAX) 40 MG tablet Take 1 tablet (40 mg total) by mouth as needed for migraine or headache. May repeat in 2 hours if headache persists or recurs. 10/02/19 11/29/20  Panosh, Standley Brooking, MD    Allergies  Allergen Reactions   Lisinopril Anaphylaxis   Lisinopril-Hydrochlorothiazide Swelling and Other (See Comments)    Angioedema  Has tolerated maxide in past so most likely  The ACE inhibitor as the cause     Social History   Socioeconomic History   Marital status: Single    Spouse name: Not on file   Number of children: Not on file   Years of education: Not on file   Highest education level: Not on file  Occupational History   Not on file  Tobacco Use   Smoking status: Never   Smokeless tobacco: Never   Vaping Use   Vaping Use: Never used  Substance and Sexual Activity   Alcohol use: Yes    Comment: social   Drug use: Never   Sexual activity: Not on file  Other Topics Concern   Not on file  Social History Narrative   Not on file   Social Determinants of Health   Financial Resource Strain: Not on file  Food Insecurity: Not on file  Transportation Needs: Not on file  Physical Activity: Not on file  Stress: Not on file  Social Connections: Not on file  Intimate Partner Violence: Not on file    Tobacco Use: Low Risk    Smoking Tobacco Use: Never   Smokeless Tobacco Use: Never   Social History   Substance and Sexual  Activity  Alcohol Use Yes   Comment: social    Family History  Problem Relation Age of Onset   Hypertension Mother    Rheum arthritis Mother    Hypertension Father    Sleep apnea Father     ROS: Constitutional: no fever, no chills, no night sweats, no significant weight loss Cardiovascular: no chest pain, no palpitations Respiratory: no cough, no shortness of breath, No COPD Gastrointestinal: no vomiting, no nausea Musculoskeletal: no swelling in Joints, Joint Pain Neurologic: no numbness, no tingling, no difficulty with balance   Objective:  Physical Exam: Well nourished and well developed.  General: Alert and oriented x3, cooperative and pleasant, no acute distress.  Head: normocephalic, atraumatic, neck supple.  Eyes: EOMI.  Respiratory: breath sounds clear in all fields, no wheezing, rales, or rhonchi. Cardiovascular: Regular rate and rhythm, no murmurs, gallops or rubs.  Abdomen: non-tender to palpation and soft, normoactive bowel sounds. Musculoskeletal:  Nonantalgic gait without using assisted devices.    Right Knee Exam: No effusion. Range of motion is 25 to 100 degrees actively, 10 to 115 degrees passively. Marked crepitus on range of motion of the knee. Diffuse lateral greater than medial joint line tenderness. Stable knee.    Calves soft and nontender. Motor function intact in LE. Strength 5/5 LE bilaterally. Neuro: Distal pulses 2+. Sensation to light touch intact in LE.    Vital signs in last 24 hours:    Imaging Review  Radiographs - AP and lateral of the right knee obtained in the fall of 2021 demonstrate tricompartmental severe bone-on-bone arthritis.  Assessment/Plan:  End stage arthritis, right knee   The patient history, physical examination, clinical judgment of the provider and imaging studies are consistent with end stage degenerative joint disease of the right knee and total knee arthroplasty is deemed medically necessary. The treatment options including medical management, injection therapy arthroscopy and arthroplasty were discussed at length. The risks and benefits of total knee arthroplasty were presented and reviewed. The risks due to aseptic loosening, infection, stiffness, patella tracking problems, thromboembolic complications and other imponderables were discussed. The patient acknowledged the explanation, agreed to proceed with the plan and consent was signed. Patient is being admitted for inpatient treatment for surgery, pain control, PT, OT, prophylactic antibiotics, VTE prophylaxis, progressive ambulation and ADLs and discharge planning. The patient is planning to be discharged  home .   Patient's anticipated LOS is less than 2 midnights, meeting these requirements: - Younger than 41 - Lives within 1 hour of care - Has a competent adult at home to recover with post-op recover - NO history of  - Chronic pain requiring opiods  - Diabetes  - Coronary Artery Disease  - Heart failure  - Heart attack  - Stroke  - DVT/VTE  - Cardiac arrhythmia  - Respiratory Failure/COPD  - Renal failure  - Anemia  - Advanced Liver disease    Therapy Plans: [facility] Disposition: Home with Parents, [name], or [employer] Insurance Planned DVT Prophylaxis: Aspirin 349m  DME Needed: None PCP:  WShanon Ace MD (clearance received) TXA: IV Allergies: Lisinopril (anaphylaxis) Anesthesia Concerns: CPAP BMI: 39.7 Last HgbA1c: n/a  [facility] [address] - Patient was instructed on what medications to stop prior to surgery. - Follow-up visit in 2 weeks with Dr. AWynelle Link- Begin physical therapy following surgery - Pre-operative lab work as pre-surgical testing - Prescriptions will be provided in hospital at time of discharge  SFenton Foy [facility] PA-C Orthopedic Surgery [facility] [place]

## 2021-03-28 NOTE — Op Note (Signed)
OPERATIVE REPORT-TOTAL KNEE ARTHROPLASTY   Pre-operative diagnosis- Osteoarthritis  Right knee(s)  Post-operative diagnosis- Osteoarthritis Right knee(s)  Procedure-  Right  Total Knee Arthroplasty  Surgeon- Dione Plover. Burgess Sheriff, MD  Assistant- Molli Barrows, PA-C   Anesthesia-  Adductor canal block and spinal  EBL-50 mL   Drains None  Tourniquet time-  Total Tourniquet Time Documented: Thigh (Right) - 30 minutes Total: Thigh (Right) - 30 minutes     Complications- None  Condition-PACU - hemodynamically stable.   Brief Clinical Note  Jennifer [name] is a 45 y.o. year old female with end stage OA of her right knee with progressively worsening pain and dysfunction. She has constant pain, with activity and at rest and significant functional deficits with difficulties even with ADLs. She has had extensive non-op management including analgesics, injections of cortisone and viscosupplements, and home exercise program, but remains in significant pain with significant dysfunction.Radiographs show bone on bone arthritis medial and patellofemoral. She presents now for right Total Knee Arthroplasty.     Procedure in detail---   The patient is brought into the operating room and positioned supine on the operating table. After successful administration of  Adductor canal block and spinal   a tourniquet is placed high on the  Right thigh(s) and the lower extremity is prepped and draped in the usual sterile fashion. Time out is performed by the operating team and then the  Right lower extremity is wrapped in Esmarch, knee flexed and the tourniquet inflated to 300 mmHg.       A midline incision is made with a ten blade through the subcutaneous tissue to the level of the extensor mechanism. A fresh blade is used to make a medial parapatellar arthrotomy. Soft tissue over the proximal medial tibia is subperiosteally elevated to the joint line with a knife and into the semimembranosus bursa with a Cobb  elevator. Soft tissue over the proximal lateral tibia is elevated with attention being paid to avoiding the patellar tendon on the tibial tubercle. The patella is everted, knee flexed 90 degrees and the ACL and PCL are removed. Findings are bone on bone all 3 compartments with large global osteophytes.        The drill is used to create a starting hole in the distal femur and the canal is thoroughly irrigated with sterile saline to remove the fatty contents. The 5 degree Right  valgus alignment guide is placed into the femoral canal and the distal femoral cutting block is pinned to remove 9 mm off the distal femur. Resection is made with an oscillating saw.      The tibia is subluxed forward and the menisci are removed. The extramedullary alignment guide is placed referencing proximally at the medial aspect of the tibial tubercle and distally along the second metatarsal axis and tibial crest. The block is pinned to remove 20mm off the more deficient medial  side. Resection is made with an oscillating saw. Size 5is the most appropriate size for the tibia and the proximal tibia is prepared with the modular drill and keel punch for that size.      The femoral sizing guide is placed and size 5 is most appropriate. Rotation is marked off the epicondylar axis and confirmed by creating a rectangular flexion gap at 90 degrees. The size 5 cutting block is pinned in this rotation and the anterior, posterior and chamfer cuts are made with the oscillating saw. The intercondylar block is then placed and that cut is made.  Trial size 5 tibial component, trial size 5 posterior stabilized femur and a 8  mm posterior stabilized rotating platform insert trial is placed. Full extension is achieved with excellent varus/valgus and anterior/posterior balance throughout full range of motion. The patella is everted and thickness measured to be 22  mm. Free hand resection is taken to 12 mm, a 38 template is placed, lug holes are  drilled, trial patella is placed, and it tracks normally. Osteophytes are removed off the posterior femur with the trial in place. All trials are removed and the cut bone surfaces prepared with pulsatile lavage. Cement is mixed and once ready for implantation, the size 5 tibial implant, size  5 posterior stabilized femoral component, and the size 38 patella are cemented in place and the patella is held with the clamp. The trial insert is placed and the knee held in full extension. The Exparel (20 ml mixed with 60 ml saline) is injected into the extensor mechanism, posterior capsule, medial and lateral gutters and subcutaneous tissues.  All extruded cement is removed and once the cement is hard the permanent 8 mm posterior stabilized rotating platform insert is placed into the tibial tray.      The wound is copiously irrigated with saline solution and the extensor mechanism closed with # 0 Stratofix suture. The tourniquet is released for a total tourniquet time of 30  minutes. Flexion against gravity is 125 degrees and the patella tracks normally. Subcutaneous tissue is closed with 2.0 vicryl and subcuticular with running 4.0 Monocryl. The incision is cleaned and dried and steri-strips and a bulky sterile dressing are applied. The limb is placed into a knee immobilizer and the patient is awakened and transported to recovery in stable condition.      Please note that a surgical assistant was a medical necessity for this procedure in order to perform it in a safe and expeditious manner. Surgical assistant was necessary to retract the ligaments and vital neurovascular structures to prevent injury to them and also necessary for proper positioning of the limb to allow for anatomic placement of the prosthesis.   Dione Plover Sanari Offner, MD    03/28/2021, 12:51 PM

## 2021-03-28 NOTE — Plan of Care (Signed)
?  Problem: Education: ?Goal: Knowledge of General Education information will improve ?Description: Including pain rating scale, medication(s)/side effects and non-pharmacologic comfort measures ?Outcome: Progressing ?  ?Problem: Activity: ?Goal: Risk for activity intolerance will decrease ?Outcome: Progressing ?  ?Problem: Nutrition: ?Goal: Adequate nutrition will be maintained ?Outcome: Progressing ?  ?Problem: Elimination: ?Goal: Will not experience complications related to bowel motility ?Outcome: Progressing ?  ?Problem: Pain Managment: ?Goal: General experience of comfort will improve ?Outcome: Progressing ?  ?Problem: Education: ?Goal: Knowledge of the prescribed therapeutic regimen will improve ?Outcome: Progressing ?  ?Problem: Activity: ?Goal: Ability to avoid complications of mobility impairment will improve ?Outcome: Progressing ?  ?Problem: Pain Management: ?Goal: Pain level will decrease with appropriate interventions ?Outcome: Progressing ?  ?

## 2021-03-28 NOTE — Anesthesia Procedure Notes (Signed)
Anesthesia [facility] Block: Adductor canal block   Pre-Anesthetic Checklist: , timeout performed,  Correct Patient, Correct Site, Correct Laterality,  Correct Procedure, Correct Position, site marked,  Risks and benefits discussed,  Surgical consent,  Pre-op evaluation,  At surgeon's request and post-op pain management  Laterality: Right and Lower  Prep: chloraprep       Needles:  Injection technique: Single-shot  Needle Type: Echogenic Needle     Needle Length: 9cm  Needle Gauge: 21     Additional Needles:   Procedures:,,,, ultrasound used (permanent image in chart),,    Narrative:  Start time: 03/28/2021 11:24 AM End time: 03/28/2021 11:30 AM Injection made incrementally with aspirations every 5 mL.  Performed by: Personally  Anesthesiologist: Annye Asa, MD  Additional Notes: Pt identified in Holding room.  Monitors applied. Working IV access confirmed. Sterile prep R thigh.  #21ga ECHOgenic Arrow block needle into adductor canal with US guidance.  20cc 0.75% Ropivacaine injected incrementally after negative test dose.  Patient asymptomatic, VSS, no heme aspirated, tolerated well.   Jenita Seashore, MD

## 2021-03-28 NOTE — Progress Notes (Signed)
AssistedDr. Carswell Jackson with right, ultrasound guided, adductor canal block. Side rails up, monitors on throughout procedure. See vital signs in flow sheet. Tolerated Procedure well.  

## 2021-03-28 NOTE — Anesthesia Procedure Notes (Addendum)
Spinal  Patient location during procedure: OR End time: 03/28/2021 11:44 AM Reason for block: surgical anesthesia Staffing Performed: anesthesiologist  Anesthesiologist: Annye Asa, MD Preanesthetic Checklist Completed: patient identified, IV checked, site marked, risks and benefits discussed, surgical consent, monitors and equipment checked, pre-op evaluation and timeout performed Spinal Block Patient position: sitting Prep: DuraPrep and site prepped and draped Patient monitoring: blood pressure, continuous pulse ox, cardiac monitor and heart rate Approach: midline Location: L3-4 Injection technique: single-shot Needle Needle type: Pencan and Introducer  Needle gauge: 24 G Needle length: 9 cm Assessment Events: CSF return Additional Notes Pt identified in Operating room.  Monitors applied. Working IV access confirmed. Sterile prep, drape lumbar spine.  1% lido local L 3,4.  #24ga Pencan into clear CSF L 3,4.  12mg  0.75% Bupivacaine with dextrose injected with asp CSF beginning and end of injection.  Patient asymptomatic, VSS, no heme aspirated, tolerated well.  Jenita Seashore, MD

## 2021-03-28 NOTE — Interval H&P Note (Signed)
History and Physical Interval Note:  03/28/2021 9:32 AM  Jennifer [name]  has presented today for surgery, with the diagnosis of right knee osteoarthritis.  The various methods of treatment have been discussed with the patient and family. After consideration of risks, benefits and other options for treatment, the patient has consented to  Procedure(s) with comments: TOTAL KNEE ARTHROPLASTY (Right) - 59min as a surgical intervention.  The patient's history has been reviewed, patient examined, no change in status, stable for surgery.  I have reviewed the patient's chart and labs.  Questions were answered to the patient's satisfaction.     Pilar Plate Andria Head

## 2021-03-28 NOTE — Evaluation (Signed)
Physical Therapy Evaluation Patient Details Name: Jennifer [name] MRN: [id number] DOB: May 31, 1976 Today's Date: 03/28/2021   History of Present Illness  Patient is 45 y.o. female s/p Rt TKA on 03/28/21 with PMH significant for OA, RA, HTN, GERD.    Clinical Impression  Jennifer [name] is a 45 y.o. female POD 0 s/p Rt TKA. Patient reports independence with mobility at baseline. Patient is now limited by functional impairments (see PT problem list below) and requires min assist for transfers and gait with RW. Patient was able to ambulate ~50 feet with RW and min assist. Patient instructed in exercise to facilitate circulation to manage edema and reduce risk of DVT. Patient will benefit from continued skilled PT interventions to address impairments and progress towards PLOF. Acute PT will follow to progress mobility and stair training in preparation for safe discharge home.     Follow Up Recommendations Follow surgeon's recommendation for DC plan and follow-up therapies    Equipment Recommendations  Rolling walker with 5" wheels;3in1 (PT) (if covered by insurance)    Recommendations for Other Services       Precautions / Restrictions Precautions Precautions: Fall Restrictions Weight Bearing Restrictions: No Other Position/Activity Restrictions: WBAT      Mobility  Bed Mobility Overal bed mobility: Needs Assistance Bed Mobility: Supine to Sit     Supine to sit: Min assist;HOB elevated     General bed mobility comments: cues for use of bed rail and assist for LE's off EOB    Transfers Overall transfer level: Needs assistance Equipment used: Rolling walker (2 wheeled) Transfers: Sit to/from Stand Sit to Stand: Min assist         General transfer comment: cues for technique with power up, assist to steady and complete rise  Ambulation/Gait Ambulation/Gait assistance: Min assist Gait Distance (Feet): 50 Feet Assistive device: Rolling walker (2 wheeled) Gait  Pattern/deviations: Step-to pattern;Decreased stride length;Decreased weight shift to right Gait velocity: decr   General Gait Details: cues for safe step pattern and proximity to RW, pt mildly unsteady at start but improved throughout and no overt LOB noted. amb short distance in hallway and then to bathroom and back from recliner.  Stairs            Wheelchair Mobility    Modified Rankin (Stroke Patients Only)       Balance Overall balance assessment: Needs assistance Sitting-balance support: Feet supported Sitting balance-Leahy Scale: Good     Standing balance support: During functional activity;Bilateral upper extremity supported Standing balance-Leahy Scale: Poor                               Pertinent Vitals/Pain Pain Assessment: 0-10 Pain Score: 6  Pain Location: Rt knee Pain Descriptors / Indicators: Aching;Discomfort Pain Intervention(s): Limited activity within patient's tolerance;Monitored during session;Repositioned;Ice applied    Home Living Family/patient expects to be discharged to:: Private residence Living Arrangements: Alone Available Help at Discharge: Family;Friend(s) Type of Home: Apartment Home Access: Level entry     Home Layout: One level Home Equipment: [place] - 2 wheels;Cane - single point;Toilet riser      Prior Function Level of Independence: Independent               Hand Dominance   Dominant Hand: Right    Extremity/Trunk Assessment   Upper Extremity Assessment Upper Extremity Assessment: Overall WFL for tasks assessed    Lower Extremity Assessment Lower Extremity Assessment: Overall [facility]  for tasks assessed;RLE deficits/detail RLE Deficits / Details: good quad activation, no extensor lag with SLR RLE Sensation: WNL RLE Coordination: WNL    Cervical / Trunk Assessment Cervical / Trunk Assessment: Normal  Communication   Communication: No difficulties  Cognition Arousal/Alertness:  Awake/alert Behavior During Therapy: WFL for tasks assessed/performed Overall Cognitive Status: Within Functional Limits for tasks assessed                                        General Comments      Exercises Total Joint Exercises Ankle Circles/Pumps: AROM;Both;Seated;20 reps   Assessment/Plan    PT Assessment Patient needs continued PT services  PT Problem List Decreased strength;Decreased range of motion;Decreased activity tolerance;Decreased balance;Decreased mobility;Decreased knowledge of use of DME;Decreased knowledge of precautions;Pain       PT Treatment Interventions DME instruction;Gait training;Stair training;Functional mobility training;Therapeutic activities;Therapeutic exercise;Balance training;Patient/family education    PT Goals (Current goals can be found in the Care Plan section)  Acute Rehab PT Goals Patient Stated Goal: be able to carry her luggage on an airplane to travel again PT Goal Formulation: With patient Time For Goal Achievement: 04/04/21 Potential to Achieve Goals: Good    Frequency 7X/week   Barriers to discharge        Co-evaluation               AM-PAC PT "6 Clicks" Mobility  Outcome Measure Help needed turning from your back to your side while in a flat bed without using bedrails?: A Little Help needed moving from lying on your back to sitting on the side of a flat bed without using bedrails?: A Little Help needed moving to and from a bed to a chair (including a wheelchair)?: A Little Help needed standing up from a chair using your arms (e.g., wheelchair or bedside chair)?: A Little Help needed to walk in hospital room?: A Little Help needed climbing 3-5 steps with a railing? : A Little 6 Click Score: 18    End of Session Equipment Utilized During Treatment: Gait belt Activity Tolerance: Patient tolerated treatment well Patient left: in chair;with call bell/phone within reach;with chair alarm set;with  family/visitor present Nurse Communication: Mobility status PT Visit Diagnosis: Muscle weakness (generalized) (M62.81);Difficulty in walking, not elsewhere classified (R26.2)    Time: 1701-1740 PT Time Calculation (min) (ACUTE ONLY): 39 min   Charges:   PT Evaluation $PT Eval Low Complexity: 1 Low PT Treatments $Gait Training: 8-22 mins $Therapeutic Activity: 8-22 mins        Verner Mould, DPT Acute Rehabilitation Services Office [phone] Pager [phone]   Jacques Navy 03/28/2021, 6:49 PM

## 2021-03-28 NOTE — Anesthesia Postprocedure Evaluation (Signed)
Anesthesia Post Note  Patient: Jennifer [name]  Procedure(s) Performed: TOTAL KNEE ARTHROPLASTY (Right: Knee)     Patient location during evaluation: PACU Anesthesia Type: Spinal Level of consciousness: awake and alert, patient cooperative and oriented Pain management: pain level controlled Vital Signs Assessment: post-procedure vital signs reviewed and stable Respiratory status: spontaneous breathing, nonlabored ventilation and respiratory function stable Cardiovascular status: blood pressure returned to baseline and stable Postop Assessment: no apparent nausea or vomiting, patient able to bend at knees and spinal receding Anesthetic complications: no   No notable events documented.  Last Vitals:  Vitals:   03/28/21 1445 03/28/21 1511  BP: (!) 144/88 (!) 141/92  Pulse: (!) 49 (!) 54  Resp: 14 18  Temp:  (!) 36.1 C  SpO2: 100% 100%    Last Pain:  Vitals:   03/28/21 1511  TempSrc:   PainSc: 2                  Kingstyn Deruiter,E. Abbigaile Rockman

## 2021-03-28 NOTE — Progress Notes (Signed)
Orthopedic Tech Progress Note Patient Details:  Jennifer [name] Mar 01, 1976 [id number]  CPM Right Knee CPM Right Knee: On Right Knee Flexion (Degrees): 45 Right Knee Extension (Degrees): 10  Post Interventions Patient Tolerated: Well Instructions Provided: Care of device  CPM applied to R knee per pt request.  Increased from 40 to 45 degrees of flexion.  Pt tolerated well and in no significant pain.   Thanks,  Verdene Lennert, PT, DPT  Acute Rehabilitation Ortho Tech Supervisor [phone] pager #[phone] office     [name] 03/28/2021, 8:40 PM

## 2021-03-28 NOTE — Progress Notes (Signed)
Orthopedic Tech Progress Note Patient Details:  Jennifer [name] 04/04/1976 [id number]  CPM Right Knee CPM Right Knee: On Right Knee Flexion (Degrees): 40 Right Knee Extension (Degrees): 10  Post Interventions Patient Tolerated: Well Instructions Provided: Care of device  [name] 03/28/2021, 1:40 PM

## 2021-03-28 NOTE — Discharge Instructions (Signed)
 Frank Aluisio, MD Total Joint Specialist [facility] [place] [address] [place], [place] [address] [phone]  TOTAL KNEE REPLACEMENT POSTOPERATIVE DIRECTIONS    Knee Rehabilitation, Guidelines Following Surgery  Results after knee surgery are often greatly improved when you follow the exercise, range of motion and muscle strengthening exercises prescribed by your doctor. Safety measures are also important to protect the knee from further injury. If any of these exercises cause you to have increased pain or swelling in your knee joint, decrease the amount until you are comfortable again and slowly increase them. If you have problems or questions, call your caregiver or physical therapist for advice.   BLOOD CLOT PREVENTION Take a 325 mg Aspirin two times a day for three weeks following surgery. Then take an 81 mg Aspirin once a day for three weeks. Then discontinue Aspirin. You may resume your vitamins/supplements upon discharge from the hospital. Do not take any NSAIDs (Advil, Aleve, Ibuprofen, Meloxicam, etc.) until you have discontinued the 325 mg Aspirin.  HOME CARE INSTRUCTIONS  Remove items at home which could result in a fall. This includes throw rugs or furniture in walking pathways.  ICE to the affected knee as much as tolerated. Icing helps control swelling. If the swelling is well controlled you will be more comfortable and rehab easier. Continue to use ice on the knee for pain and swelling from surgery. You may notice swelling that will progress down to the foot and ankle. This is normal after surgery. Elevate the leg when you are not up walking on it.    Continue to use the breathing machine which will help keep your temperature down. It is common for your temperature to cycle up and down following surgery, especially at night when you are not up moving around and exerting yourself. The breathing machine keeps your lungs expanded and your temperature  down. Do not place pillow under the operative knee, focus on keeping the knee straight while resting  DIET You may resume your previous home diet once you are discharged from the hospital.  DRESSING / WOUND CARE / SHOWERING Keep your bulky bandage on for 2 days. On the third post-operative day you may remove the Ace bandage and gauze. There is a waterproof adhesive bandage on your skin which will stay in place until your first follow-up appointment. Once you remove this you will not need to place another bandage You may begin showering 3 days following surgery, but do not submerge the incision under water.  ACTIVITY For the first 5 days, the key is rest and control of pain and swelling Do your home exercises twice a day starting on post-operative day 3. On the days you go to physical therapy, just do the home exercises once that day. You should rest, ice and elevate the leg for 50 minutes out of every hour. Get up and walk/stretch for 10 minutes per hour. After 5 days you can increase your activity slowly as tolerated. Walk with your walker as instructed. Use the walker until you are comfortable transitioning to a cane. Walk with the cane in the opposite hand of the operative leg. You may discontinue the cane once you are comfortable and walking steadily. Avoid periods of inactivity such as sitting longer than an hour when not asleep. This helps prevent blood clots.  You may discontinue the knee immobilizer once you are able to perform a straight leg raise while lying down. You may resume a sexual relationship in one month   or when given the OK by your doctor.  You may return to work once you are cleared by your doctor.  Do not drive a car for 6 weeks or until released by your surgeon.  Do not drive while taking narcotics.  TED HOSE STOCKINGS Wear the elastic stockings on both legs for three weeks following surgery during the day. You may remove them at night for sleeping.  WEIGHT  BEARING Weight bearing as tolerated with assist device (walker, cane, etc) as directed, use it as long as suggested by your surgeon or therapist, typically at least 4-6 weeks.  POSTOPERATIVE CONSTIPATION PROTOCOL Constipation - defined medically as fewer than three stools per week and severe constipation as less than one stool per week.  One of the most common issues patients have following surgery is constipation.  Even if you have a regular bowel pattern at home, your normal regimen is likely to be disrupted due to multiple reasons following surgery.  Combination of anesthesia, postoperative narcotics, change in appetite and fluid intake all can affect your bowels.  In order to avoid complications following surgery, here are some recommendations in order to help you during your recovery period.  Colace (docusate) - Pick up an over-the-counter form of Colace or another stool softener and take twice a day as long as you are requiring postoperative pain medications.  Take with a full glass of water daily.  If you experience loose stools or diarrhea, hold the colace until you stool forms back up. If your symptoms do not get better within 1 week or if they get worse, check with your doctor. Dulcolax (bisacodyl) - Pick up over-the-counter and take as directed by the product packaging as needed to assist with the movement of your bowels.  Take with a full glass of water.  Use this product as needed if not relieved by Colace only.  MiraLax (polyethylene glycol) - Pick up over-the-counter to have on hand. MiraLax is a solution that will increase the amount of water in your bowels to assist with bowel movements.  Take as directed and can mix with a glass of water, juice, soda, coffee, or tea. Take if you go more than two days without a movement. Do not use MiraLax more than once per day. Call your doctor if you are still constipated or irregular after using this medication for 7 days in a row.  If you continue  to have problems with postoperative constipation, please contact the office for further assistance and recommendations.  If you experience "the worst abdominal pain ever" or develop nausea or vomiting, please contact the office immediatly for further recommendations for treatment.  ITCHING If you experience itching with your medications, try taking only a single pain pill, or even half a pain pill at a time.  You can also use Benadryl over the counter for itching or also to help with sleep.   MEDICATIONS See your medication summary on the "After Visit Summary" that the nursing staff will review with you prior to discharge.  You may have some home medications which will be placed on hold until you complete the course of blood thinner medication.  It is important for you to complete the blood thinner medication as prescribed by your surgeon.  Continue your approved medications as instructed at time of discharge.  PRECAUTIONS If you experience chest pain or shortness of breath - call 911 immediately for transfer to the hospital emergency department.  If you develop a fever greater that 101 F,   purulent drainage from wound, increased redness or drainage from wound, foul odor from the wound/dressing, or calf pain - CONTACT YOUR SURGEON.                                                   FOLLOW-UP APPOINTMENTS Make sure you keep all of your appointments after your operation with your surgeon and caregivers. You should call the office at the above phone number and make an appointment for approximately two weeks after the date of your surgery or on the date instructed by your surgeon outlined in the "After Visit Summary".  RANGE OF MOTION AND STRENGTHENING EXERCISES  Rehabilitation of the knee is important following a knee injury or an operation. After just a few days of immobilization, the muscles of the thigh which control the knee become weakened and shrink (atrophy). Knee exercises are designed to build up  the tone and strength of the thigh muscles and to improve knee motion. Often times heat used for twenty to thirty minutes before working out will loosen up your tissues and help with improving the range of motion but do not use heat for the first two weeks following surgery. These exercises can be done on a training (exercise) mat, on the floor, on a table or on a bed. Use what ever works the best and is most comfortable for you Knee exercises include:  Leg Lifts - While your knee is still immobilized in a splint or cast, you can do straight leg raises. Lift the leg to 60 degrees, hold for 3 sec, and slowly lower the leg. Repeat 10-20 times 2-3 times daily. Perform this exercise against resistance later as your knee gets better.  Quad and Hamstring Sets - Tighten up the muscle on the front of the thigh (Quad) and hold for 5-10 sec. Repeat this 10-20 times hourly. Hamstring sets are done by pushing the foot backward against an object and holding for 5-10 sec. Repeat as with quad sets.  Leg Slides: Lying on your back, slowly slide your foot toward your buttocks, bending your knee up off the floor (only go as far as is comfortable). Then slowly slide your foot back down until your leg is flat on the floor again. Angel Wings: Lying on your back spread your legs to the side as far apart as you can without causing discomfort.  A rehabilitation program following serious knee injuries can speed recovery and prevent re-injury in the future due to weakened muscles. Contact your doctor or a physical therapist for more information on knee rehabilitation.   POST-OPERATIVE OPIOID TAPER INSTRUCTIONS: It is important to wean off of your opioid medication as soon as possible. If you do not need pain medication after your surgery it is ok to stop day one. Opioids include: Codeine, Hydrocodone(Norco, Vicodin), Oxycodone(Percocet, oxycontin) and hydromorphone amongst others.  Long term and even short term use of opiods can  cause: Increased pain response Dependence Constipation Depression Respiratory depression And more.  Withdrawal symptoms can include Flu like symptoms Nausea, vomiting And more Techniques to manage these symptoms Hydrate well Eat regular healthy meals Stay active Use relaxation techniques(deep breathing, meditating, yoga) Do Not substitute Alcohol to help with tapering If you have been on opioids for less than two weeks and do not have pain than it is ok to stop all together.  Plan   to wean off of opioids This plan should start within one week post op of your joint replacement. Maintain the same interval or time between taking each dose and first decrease the dose.  Cut the total daily intake of opioids by one tablet each day Next start to increase the time between doses. The last dose that should be eliminated is the evening dose.   IF YOU ARE TRANSFERRED TO A SKILLED REHAB FACILITY If the patient is transferred to a skilled rehab facility following release from the hospital, a list of the current medications will be sent to the facility for the patient to continue.  When discharged from the skilled rehab facility, please have the facility set up the patient's Home Health Physical Therapy prior to being released. Also, the skilled facility will be responsible for providing the patient with their medications at time of release from the facility to include their pain medication, the muscle relaxants, and their blood thinner medication. If the patient is still at the rehab facility at time of the two week follow up appointment, the skilled rehab facility will also need to assist the patient in arranging follow up appointment in our office and any transportation needs.  MAKE SURE YOU:  Understand these instructions.  Get help right away if you are not doing well or get worse.   DENTAL ANTIBIOTICS:  In most cases prophylactic antibiotics for Dental procdeures after total joint surgery are  not necessary.  Exceptions are as follows:  1. History of prior total joint infection  2. Severely immunocompromised (Organ Transplant, cancer chemotherapy, Rheumatoid biologic meds such as Humera)  3. Poorly controlled diabetes (A1C &gt; 8.0, blood glucose over 200)  If you have one of these conditions, contact your surgeon for an antibiotic prescription, prior to your dental procedure.    Pick up stool softner and laxative for home use following surgery while on pain medications. Do not submerge incision under water. Please use good hand washing techniques while changing dressing each day. May shower starting three days after surgery. Please use a clean towel to pat the incision dry following showers. Continue to use ice for pain and swelling after surgery. Do not use any lotions or creams on the incision until instructed by your surgeon.  

## 2021-03-28 NOTE — Transfer of Care (Signed)
Immediate Anesthesia Transfer of Care Note  Patient: Jennifer [name]  Procedure(s) Performed: Procedure(s) with comments: TOTAL KNEE ARTHROPLASTY (Right) - 67min  Patient Location: PACU  Anesthesia Type:Spinal  Level of Consciousness:  sedated, patient cooperative and responds to stimulation  Airway & Oxygen Therapy:Patient Spontanous Breathing and Patient connected to face mask oxgen  Post-op Assessment:  Report given to PACU RN and Post -op Vital signs reviewed and stable  Post vital signs:  Reviewed and stable  Last Vitals:  Vitals:   03/28/21 1125 03/28/21 1130  BP: (!) 128/97 126/88  Pulse: 77 73  Resp: (!) 24 20  Temp:    SpO2: 621% 94%    Complications: No apparent anesthesia complications

## 2021-03-29 ENCOUNTER — Encounter (HOSPITAL_COMMUNITY): Payer: Self-pay | Admitting: Orthopedic Surgery

## 2021-03-29 DIAGNOSIS — M1711 Unilateral primary osteoarthritis, right knee: Secondary | ICD-10-CM | POA: Diagnosis not present

## 2021-03-29 LAB — CBC
HCT: 33.4 % — ABNORMAL LOW (ref 36.0–46.0)
Hemoglobin: 10.8 g/dL — ABNORMAL LOW (ref 12.0–15.0)
MCH: 30.7 pg (ref 26.0–34.0)
MCHC: 32.3 g/dL (ref 30.0–36.0)
MCV: 94.9 fL (ref 80.0–100.0)
Platelets: 243 10*3/uL (ref 150–400)
RBC: 3.52 MIL/uL — ABNORMAL LOW (ref 3.87–5.11)
RDW: 13.5 % (ref 11.5–15.5)
WBC: 7.9 10*3/uL (ref 4.0–10.5)
nRBC: 0 % (ref 0.0–0.2)

## 2021-03-29 LAB — BASIC METABOLIC PANEL
Anion gap: 5 (ref 5–15)
BUN: 13 mg/dL (ref 6–20)
CO2: 26 mmol/L (ref 22–32)
Calcium: 8.6 mg/dL — ABNORMAL LOW (ref 8.9–10.3)
Chloride: 105 mmol/L (ref 98–111)
Creatinine, Ser: 0.73 mg/dL (ref 0.44–1.00)
GFR, Estimated: >60 mL/min (ref >60)
Glucose, Bld: 192 mg/dL — ABNORMAL HIGH (ref 70–99)
Potassium: 4 mmol/L (ref 3.5–5.1)
Sodium: 136 mmol/L (ref 135–145)

## 2021-03-29 MED ORDER — ASPIRIN 325 MG PO TABS
325.0000 mg | ORAL_TABLET | Freq: Two times a day (BID) | ORAL | 0 refills | Status: DC
Start: 2021-03-29 — End: 2022-01-31

## 2021-03-29 MED ORDER — METHOCARBAMOL 500 MG PO TABS: 500.0000 mg | ORAL_TABLET | Freq: Four times a day (QID) | ORAL | 0 refills | Status: DC | PRN

## 2021-03-29 MED ORDER — GABAPENTIN 300 MG PO CAPS: ORAL_CAPSULE | ORAL | 0 refills | Status: DC

## 2021-03-29 MED ORDER — TRAMADOL HCL 50 MG PO TABS: 50.0000 mg | ORAL_TABLET | Freq: Four times a day (QID) | ORAL | 0 refills | Status: DC | PRN

## 2021-03-29 MED ORDER — OXYCODONE HCL 5 MG PO TABS: 5.0000 mg | ORAL_TABLET | Freq: Four times a day (QID) | ORAL | 0 refills | Status: DC | PRN

## 2021-03-29 NOTE — Progress Notes (Signed)
Physical Therapy Treatment Patient Details Name: Jennifer [name] MRN: [id number] DOB: 11-Feb-1976 Today's Date: 03/29/2021    History of Present Illness Patient is 45 y.o. female s/p Rt TKA on 03/28/21 with PMH significant for OA, RA, HTN, GERD.    PT Comments    Patient seen for follow up session to progress gait and stair mobility. No overt LOB or buckling at Rt knee and pt completed reverse step up with min guard and cues for sequencing. Pt limited this session by 10/10 pain and unable to complete additional HEP exercises. RN notified and pt planning to recover additional night to manage pain for improved mobility. Acute PT will continue to progress as able.     Follow Up Recommendations  Follow surgeon's recommendation for DC plan and follow-up therapies     Equipment Recommendations  Rolling walker with 5" wheels;3in1 (PT) (if covered by insurance)    Recommendations for Other Services       Precautions / Restrictions Precautions Precautions: Fall Restrictions Weight Bearing Restrictions: No Other Position/Activity Restrictions: WBAT    Mobility  Bed Mobility               General bed mobility comments: pt OOB in recliner    Transfers Overall transfer level: Needs assistance Equipment used: Rolling walker (2 wheeled) Transfers: Sit to/from Stand Sit to Stand: Min guard;Supervision         General transfer comment: good carryover for safe technique, no assist, guard/sup for safety.  Ambulation/Gait Ambulation/Gait assistance: Min guard;Supervision Gait Distance (Feet): 160 Feet Assistive device: Rolling walker (2 wheeled) Gait Pattern/deviations: Step-to pattern;Decreased stride length;Decreased weight shift to right;Step-through pattern Gait velocity: decr   General Gait Details: good carryover for step to pattern progressing to step through. no overt LOB noted.   Stairs Stairs: Yes Stairs assistance: Min guard Stair Management: No rails;Step to  pattern;Backwards;With walker Number of Stairs: 1 (twice) General stair comments: cues for reverse step up technique for step stool by EOB. cues for "up with good, down with bad" and walker position.   Wheelchair Mobility    Modified Rankin (Stroke Patients Only)       Balance Overall balance assessment: Needs assistance Sitting-balance support: Feet supported Sitting balance-Leahy Scale: Good     Standing balance support: During functional activity;Bilateral upper extremity supported Standing balance-Leahy Scale: Poor                              Cognition Arousal/Alertness: Awake/alert Behavior During Therapy: WFL for tasks assessed/performed Overall Cognitive Status: Within Functional Limits for tasks assessed                                        Exercises Total Joint Exercises Ankle Circles/Pumps: AROM;Both;Seated;15 reps    General Comments        Pertinent Vitals/Pain Pain Assessment: 0-10 Pain Score: 10-Worst pain ever Pain Location: Rt thigh Pain Descriptors / Indicators: Aching;Discomfort Pain Intervention(s): Limited activity within patient's tolerance;Monitored during session;Premedicated before session;Patient requesting pain meds-RN notified;Ice applied    Home Living                      Prior Function            PT Goals (current goals can now be found in the care plan section) Acute Rehab PT Goals Patient  Stated Goal: be able to carry her luggage on an airplane to travel again PT Goal Formulation: With patient Time For Goal Achievement: 04/04/21 Potential to Achieve Goals: Good Progress towards PT goals: Progressing toward goals    Frequency    7X/week      PT Plan Current plan remains appropriate    Co-evaluation              AM-PAC PT "6 Clicks" Mobility   Outcome Measure  Help needed turning from your back to your side while in a flat bed without using bedrails?: A Little Help  needed moving from lying on your back to sitting on the side of a flat bed without using bedrails?: A Little Help needed moving to and from a bed to a chair (including a wheelchair)?: A Little Help needed standing up from a chair using your arms (e.g., wheelchair or bedside chair)?: A Little Help needed to walk in hospital room?: A Little Help needed climbing 3-5 steps with a railing? : A Little 6 Click Score: 18    End of Session Equipment Utilized During Treatment: Gait belt Activity Tolerance: Patient tolerated treatment well Patient left: in chair;with call bell/phone within reach;with chair alarm set;with family/visitor present Nurse Communication: Mobility status PT Visit Diagnosis: Muscle weakness (generalized) (M62.81);Difficulty in walking, not elsewhere classified (R26.2)     Time: 9574-7340 PT Time Calculation (min) (ACUTE ONLY): 30 min  Charges:  $Gait Training: 23-37 mins                     Verner Mould, DPT Acute Rehabilitation Services Office [phone] Pager [phone]    Jacques Navy 03/29/2021, 5:48 PM

## 2021-03-29 NOTE — Progress Notes (Signed)
Physical Therapy Treatment Patient Details Name: Jennifer [name] MRN: [id number] DOB: 02-09-1976 Today's Date: 03/29/2021    History of Present Illness Patient is 45 y.o. female s/p Rt TKA on 03/28/21 with PMH significant for OA, RA, HTN, GERD.    PT Comments    Patient is making good progress with mobility and ambulated ~200' with min assist/guard for gait, no LOB noted. Patient initiated HEP for ROM and circulation.  Acute PT will follow up for additional therapy to progress mobility as able in preparation for safe discharge home.   Follow Up Recommendations  Follow surgeon's recommendation for DC plan and follow-up therapies     Equipment Recommendations  Rolling walker with 5" wheels;3in1 (PT) (if covered by insurance)    Recommendations for Other Services       Precautions / Restrictions Precautions Precautions: Fall Restrictions Weight Bearing Restrictions: No Other Position/Activity Restrictions: WBAT    Mobility  Bed Mobility Overal bed mobility: Needs Assistance Bed Mobility: Supine to Sit     Supine to sit: HOB elevated;Min guard     General bed mobility comments: cues for use of bed rail and extra time to bring LE's off EOB, cues to use Lt LE to help Rt    Transfers Overall transfer level: Needs assistance Equipment used: Rolling walker (2 wheeled) Transfers: Sit to/from Stand Sit to Stand: Min guard         General transfer comment: cues for technique with power up from slightly elevated EOB and BSC over toilet. pt with good recall for hand reach back and to extend Rt knee to sit.  Ambulation/Gait Ambulation/Gait assistance: Min assist;Min guard Gait Distance (Feet): 200 Feet Assistive device: Rolling walker (2 wheeled) Gait Pattern/deviations: Step-to pattern;Decreased stride length;Decreased weight shift to right Gait velocity: decr   General Gait Details: pt with good recall for safe step pattern. assist initially for safe proximity to RW as  pt steps too close to walker. no overt LOB noted. pt progressed to min guard.   Stairs             Wheelchair Mobility    Modified Rankin (Stroke Patients Only)       Balance Overall balance assessment: Needs assistance Sitting-balance support: Feet supported Sitting balance-Leahy Scale: Good     Standing balance support: During functional activity;Bilateral upper extremity supported Standing balance-Leahy Scale: Poor                              Cognition Arousal/Alertness: Awake/alert Behavior During Therapy: WFL for tasks assessed/performed Overall Cognitive Status: Within Functional Limits for tasks assessed                                        Exercises Total Joint Exercises Ankle Circles/Pumps: AROM;Both;Seated;15 reps Quad Sets: AROM;Right;5 reps;Seated Heel Slides: AROM;Right;5 reps;Seated    General Comments        Pertinent Vitals/Pain Pain Assessment: 0-10 Pain Score: 6  Pain Location: Rt thigh Pain Descriptors / Indicators: Aching;Discomfort Pain Intervention(s): Limited activity within patient's tolerance;Monitored during session;Repositioned;Ice applied;Heat applied;Premedicated before session    Home Living                      Prior Function            PT Goals (current goals can now be found in the  care plan section) Acute Rehab PT Goals Patient Stated Goal: be able to carry her luggage on an airplane to travel again PT Goal Formulation: With patient Time For Goal Achievement: 04/04/21 Potential to Achieve Goals: Good Progress towards PT goals: Progressing toward goals    Frequency    7X/week      PT Plan Current plan remains appropriate    Co-evaluation              AM-PAC PT "6 Clicks" Mobility   Outcome Measure  Help needed turning from your back to your side while in a flat bed without using bedrails?: A Little Help needed moving from lying on your back to sitting on the  side of a flat bed without using bedrails?: A Little Help needed moving to and from a bed to a chair (including a wheelchair)?: A Little Help needed standing up from a chair using your arms (e.g., wheelchair or bedside chair)?: A Little Help needed to walk in hospital room?: A Little Help needed climbing 3-5 steps with a railing? : A Little 6 Click Score: 18    End of Session Equipment Utilized During Treatment: Gait belt Activity Tolerance: Patient tolerated treatment well Patient left: in chair;with call bell/phone within reach;with chair alarm set;with family/visitor present Nurse Communication: Mobility status PT Visit Diagnosis: Muscle weakness (generalized) (M62.81);Difficulty in walking, not elsewhere classified (R26.2)     Time: 1021-1050 PT Time Calculation (min) (ACUTE ONLY): 29 min  Charges:  $Gait Training: 8-22 mins $Therapeutic Exercise: 8-22 mins                     Jennifer [name], DPT Acute Rehabilitation Services Office [phone] Pager [phone]    Jacques Navy 03/29/2021, 11:42 AM

## 2021-03-29 NOTE — TOC Transition Note (Signed)
Transition of Care [facility]) - CM/SW Discharge Note  Patient Details  Name: Jennifer [name] MRN: [id number] Date of Birth: 09-Nov-1975  [facility] CM/SW Contact:  Sherie Don, LCSW Phone Number: 03/29/2021, 1:00 PM  Clinical Narrative: Patient is expected to discharge home after working with PT. CSW met with patient to review discharge plan and needs. Patient will go to OPPT through [facility]. Patient is now in need of a rolling walker and 3N1. CSW made referral to [employer]. DME orders are in. [employer] to deliver DME to patient's room if she is agreeable to the cost her insurance will not cover. TOC signing off.  Final next level of care: OP Rehab Barriers to Discharge: No Barriers Identified  Patient Goals and CMS Choice Patient states their goals for this hospitalization and ongoing recovery are:: Discharge home with OPPT through [facility] CMS Medicare.gov Compare Post Acute Care list provided to:: Patient Choice offered to / list presented to : Patient  Discharge Plan and Services         DME Arranged: 3-N-1, Walker rolling DME Agency: [employer] Date DME Agency Contacted: 03/29/21 Time DME Agency Contacted: 1156 Representative spoke with at DME Agency: Freda Munro  Readmission Risk Interventions No flowsheet data found.

## 2021-03-29 NOTE — Progress Notes (Signed)
Patient having continued pain throughout the day, K Edmisten PA notified and order to cancel discharge received D Mateo Flow RN

## 2021-03-29 NOTE — Progress Notes (Signed)
Subjective: 1 Day Post-Op Procedure(s) (LRB): TOTAL KNEE ARTHROPLASTY (Right) Patient reports pain as mild.   Patient seen in rounds by Dr. Wynelle Link. Patient is well, and has had no acute complaints or problems. Denies SOB, chest pain, or calf pain. No acute overnight events. Ambulated 50 feet with therapy yesterday. Will continue therapy today.   We will continue therapy today.   Objective: Vital signs in last 24 hours: Temp:  [97 F (36.1 C)-98.4 F (36.9 C)] 97.7 F (36.5 C) (07/12 0547) Pulse Rate:  [49-91] 72 (07/12 0547) Resp:  [14-24] 16 (07/12 0547) BP: (103-151)/(72-105) 133/87 (07/12 0547) SpO2:  [95 %-100 %] 99 % (07/12 0547) Weight:  [104.3 kg] 104.3 kg (07/11 1515)  Intake/Output from previous day:  Intake/Output Summary (Last 24 hours) at 03/29/2021 0846 Last data filed at 03/29/2021 0547 Gross per 24 hour  Intake 2966.67 ml  Output 2025 ml  Net 941.67 ml     Intake/Output this shift: No intake/output data recorded.  Labs: Recent Labs    03/29/21 0327  HGB 10.8*   Recent Labs    03/29/21 0327  WBC 7.9  RBC 3.52*  HCT 33.4*  PLT 243   Recent Labs    03/29/21 0327  NA 136  K 4.0  CL 105  CO2 26  BUN 13  CREATININE 0.73  GLUCOSE 192*  CALCIUM 8.6*   No results for input(s): LABPT, INR in the last 72 hours.  Exam: General - Patient is Alert and Oriented Extremity - Neurologically intact Neurovascular intact Intact pulses distally Dorsiflexion/Plantar flexion intact Dressing - dressing C/D/I Motor Function - intact, moving foot and toes well on exam.   Past Medical History:  Diagnosis Date   Acute otitis media 08/28/2012   improved  change to liquid medication    Breast abscess of female    Recurrent   Family history of adverse reaction to anesthesia    father hard time waking once (12/20/2016)   Fibroids    w/bleeding   GERD (gastroesophageal reflux disease)    History of blood transfusion    after surgery   Hypertension     Migraines    Dr. Orie Rout; "I have a few/month" (12/20/2016)   Osteoarthritis    Pill esophagitis 08/28/2012   amoxicillin  by hx  disc plan nl voice except hoarse not drooling  close fu  if not getting better with plan stop aleve  liquid ibu onlyf necessary    Recurrent periodic urticaria    Rheumatoid arthritis ([place])    "55% of my body" (12/20/2016)   Rheumatoid arthritis ([place])    Sleep apnea    wears cpap    Assessment/Plan: 1 Day Post-Op Procedure(s) (LRB): TOTAL KNEE ARTHROPLASTY (Right) Principal Problem:   OA (osteoarthritis) of knee Active Problems:   Primary osteoarthritis of right knee  Estimated body mass index is 39.47 kg/m as calculated from the following:   Height as of this encounter: 5\' 4"  (1.626 m).   Weight as of this encounter: 104.3 kg. Up with therapy   Patient's anticipated LOS is less than 2 midnights, meeting these requirements: - Lives within 1 hour of care - Has a competent adult at home to recover with post-op  - NO history of  - Chronic pain requiring opioids  - Diabetes  - Coronary Artery Disease  - Heart failure  - Heart attack  - Stroke  - DVT/VTE  - Cardiac arrhythmia  - Respiratory Failure/COPD  - Renal failure  -  Anemia  - Advanced Liver disease     DVT Prophylaxis - Aspirin and TED hose Weight bearing as tolerated. Continue therapy.  Plan is to go Home after hospital stay.  Plan for two sessions with PT this morning, and if meeting goals, will plan for discharge this afternoon.   Patient to follow up in two weeks with Dr. Wynelle Link in clinic.   The PDMP database was reviewed today prior to any opioid medications being prescribed to this patient.Fenton Foy, MBA, PA-C Orthopedic Surgery 03/29/2021, 8:46 AM

## 2021-03-30 DIAGNOSIS — M1711 Unilateral primary osteoarthritis, right knee: Secondary | ICD-10-CM | POA: Diagnosis not present

## 2021-03-30 LAB — BASIC METABOLIC PANEL
Anion gap: 5 (ref 5–15)
BUN: 11 mg/dL (ref 6–20)
CO2: 26 mmol/L (ref 22–32)
Calcium: 8.3 mg/dL — ABNORMAL LOW (ref 8.9–10.3)
Chloride: 106 mmol/L (ref 98–111)
Creatinine, Ser: 0.67 mg/dL (ref 0.44–1.00)
GFR, Estimated: >60 mL/min (ref >60)
Glucose, Bld: 126 mg/dL — ABNORMAL HIGH (ref 70–99)
Potassium: 3.7 mmol/L (ref 3.5–5.1)
Sodium: 137 mmol/L (ref 135–145)

## 2021-03-30 LAB — CBC
HCT: 29.1 % — ABNORMAL LOW (ref 36.0–46.0)
Hemoglobin: 9.3 g/dL — ABNORMAL LOW (ref 12.0–15.0)
MCH: 30.1 pg (ref 26.0–34.0)
MCHC: 32 g/dL (ref 30.0–36.0)
MCV: 94.2 fL (ref 80.0–100.0)
Platelets: 243 10*3/uL (ref 150–400)
RBC: 3.09 MIL/uL — ABNORMAL LOW (ref 3.87–5.11)
RDW: 13.9 % (ref 11.5–15.5)
WBC: 9.8 10*3/uL (ref 4.0–10.5)
nRBC: 0 % (ref 0.0–0.2)

## 2021-03-30 NOTE — Progress Notes (Signed)
Physical Therapy Treatment Patient Details Name: Jennifer [name] MRN: [id number] DOB: [birth year] Today's Date: 03/30/2021    History of Present Illness Patient is 45 y.o. female s/p Rt TKA on 03/28/21 with PMH significant for OA, RA, HTN, GERD.    PT Comments    Pt is progressing well with mobility, she ambulated 200' with RW and reviewed stair training.  Will review HEP this afternoon. She reports most pain is in her R thigh.    Follow Up Recommendations  Follow surgeon's recommendation for DC plan and follow-up therapies     Equipment Recommendations  Rolling walker with 5" wheels;3in1 (PT) (if covered by insurance)    Recommendations for Other Services       Precautions / Restrictions Precautions Precautions: Fall Restrictions Weight Bearing Restrictions: No Other Position/Activity Restrictions: WBAT    Mobility  Bed Mobility Overal bed mobility: Modified Independent Bed Mobility: Supine to Sit     Supine to sit: Modified independent (Device/Increase time);HOB elevated          Transfers Overall transfer level: Needs assistance Equipment used: Rolling walker (2 wheeled) Transfers: Sit to/from Stand Sit to Stand: Supervision            Ambulation/Gait Ambulation/Gait assistance: Supervision Gait Distance (Feet): 200 Feet Assistive device: Rolling walker (2 wheeled) Gait Pattern/deviations: Step-to pattern;Decreased stride length;Decreased weight shift to right;Step-through pattern     General Gait Details: steady, no loss of balance   Stairs Stairs: Yes Stairs assistance: Supervision Stair Management: No rails;Step to pattern;Backwards;With walker Number of Stairs: 1 General stair comments: 1 step backwards x 2 trials, good recall of sequencing   Wheelchair Mobility    Modified Rankin (Stroke Patients Only)       Balance Overall balance assessment: Needs assistance Sitting-balance support: Feet supported Sitting balance-Leahy Scale:  Good     Standing balance support: During functional activity;Bilateral upper extremity supported Standing balance-Leahy Scale: Poor                              Cognition Arousal/Alertness: Awake/alert Behavior During Therapy: WFL for tasks assessed/performed Overall Cognitive Status: Within Functional Limits for tasks assessed                                 General Comments: pt teary at times, worried that she won't be able to achieve full extension with her knee. Since meniscus surgery a year and 1/2 ago she's lacked full extension.      Exercises Total Joint Exercises Ankle Circles/Pumps: AROM;Both;Seated;15 reps Quad Sets: AROM;Right;5 reps;Seated Heel Slides: AROM;Right;5 reps;Seated Knee Flexion: AAROM;Right;15 reps;Seated    General Comments        Pertinent Vitals/Pain Pain Score: 6  Pain Location: Rt thigh Pain Descriptors / Indicators: Aching;Discomfort;Grimacing;Guarding Pain Intervention(s): Limited activity within patient's tolerance;Monitored during session;Premedicated before session;Ice applied;Heat applied (ice to knee, heat to thigh)    Home Living                      Prior Function            PT Goals (current goals can now be found in the care plan section) Acute Rehab PT Goals Patient Stated Goal: be able to carry her luggage on an airplane to travel again PT Goal Formulation: With patient Time For Goal Achievement: 04/04/21 Potential to Achieve Goals: Good Progress towards PT  goals: Progressing toward goals    Frequency    7X/week      PT Plan Current plan remains appropriate    Co-evaluation              AM-PAC PT "6 Clicks" Mobility   Outcome Measure  Help needed turning from your back to your side while in a flat bed without using bedrails?: A Little Help needed moving from lying on your back to sitting on the side of a flat bed without using bedrails?: A Little Help needed moving to  and from a bed to a chair (including a wheelchair)?: A Little Help needed standing up from a chair using your arms (e.g., wheelchair or bedside chair)?: None Help needed to walk in hospital room?: None Help needed climbing 3-5 steps with a railing? : A Little 6 Click Score: 20    End of Session Equipment Utilized During Treatment: Gait belt Activity Tolerance: Patient tolerated treatment well;Patient limited by pain Patient left: in chair;with call bell/phone within reach;with chair alarm set Nurse Communication: Mobility status PT Visit Diagnosis: Muscle weakness (generalized) (M62.81);Difficulty in walking, not elsewhere classified (R26.2)     Time: 4859-2763 PT Time Calculation (min) (ACUTE ONLY): 33 min  Charges:  $Gait Training: 8-22 mins $Therapeutic Exercise: 8-22 mins                    Blondell Reveal Kistler PT 03/30/2021  Acute Rehabilitation Services Pager [phone] Office [phone]

## 2021-03-30 NOTE — Progress Notes (Signed)
   Subjective: 2 Days Post-Op Procedure(s) (LRB): TOTAL KNEE ARTHROPLASTY (Right) Patient reports pain as mild.   Patient seen in rounds for Dr. Wynelle Link. Patient is well, and has had no acute complaints or problems. Had issues with thigh pain yesterday, but reports this has improved. Denies chest pain or SOB. Positive flatus, voiding without difficulty. Plan is to go Home after hospital stay.  Objective: Vital signs in last 24 hours: Temp:  [97.6 F (36.4 C)-98.2 F (36.8 C)] 97.9 F (36.6 C) (07/13 0618) Pulse Rate:  [72-79] 74 (07/13 0618) Resp:  [15-18] 15 (07/13 0618) BP: (145-152)/(86-92) 148/91 (07/13 0618) SpO2:  [99 %-100 %] 99 % (07/13 0618)  Intake/Output from previous day:  Intake/Output Summary (Last 24 hours) at 03/30/2021 0742 Last data filed at 03/29/2021 1518 Gross per 24 hour  Intake 948.84 ml  Output 450 ml  Net 498.84 ml    Intake/Output this shift: No intake/output data recorded.  Labs: Recent Labs    03/29/21 0327 03/30/21 0310  HGB 10.8* 9.3*   Recent Labs    03/29/21 0327 03/30/21 0310  WBC 7.9 9.8  RBC 3.52* 3.09*  HCT 33.4* 29.1*  PLT 243 243   Recent Labs    03/29/21 0327 03/30/21 0310  NA 136 137  K 4.0 3.7  CL 105 106  CO2 26 26  BUN 13 11  CREATININE 0.73 0.67  GLUCOSE 192* 126*  CALCIUM 8.6* 8.3*   No results for input(s): LABPT, INR in the last 72 hours.  Exam: General - Patient is Alert and Oriented Extremity - Neurologically intact Neurovascular intact Sensation intact distally Dorsiflexion/Plantar flexion intact Dressing/Incision - clean, dry, no drainage Motor Function - intact, moving foot and toes well on exam.   Past Medical History:  Diagnosis Date   Acute otitis media 08/28/2012   improved  change to liquid medication    Breast abscess of female    Recurrent   Family history of adverse reaction to anesthesia    father hard time waking once (12/20/2016)   Fibroids    w/bleeding   GERD  (gastroesophageal reflux disease)    History of blood transfusion    after surgery   Hypertension    Migraines    Dr. Orie Rout; "I have a few/month" (12/20/2016)   Osteoarthritis    Pill esophagitis 08/28/2012   amoxicillin  by hx  disc plan nl voice except hoarse not drooling  close fu  if not getting better with plan stop aleve  liquid ibu onlyf necessary    Recurrent periodic urticaria    Rheumatoid arthritis ([place])    "55% of my body" (12/20/2016)   Rheumatoid arthritis ([place])    Sleep apnea    wears cpap    Assessment/Plan: 2 Days Post-Op Procedure(s) (LRB): TOTAL KNEE ARTHROPLASTY (Right) Principal Problem:   OA (osteoarthritis) of knee Active Problems:   Primary osteoarthritis of right knee  Estimated body mass index is 39.47 kg/m as calculated from the following:   Height as of this encounter: 5\' 4"  (1.626 m).   Weight as of this encounter: 104.3 kg. Up with therapy  DVT Prophylaxis - Aspirin Weight-bearing as tolerated  Plan for discharge this afternoon after two sessions of PT. Scheduling OPPT for tomorrow or Friday. Follow-up in the office in 2 weeks.   Theresa Duty, PA-C Orthopedic Surgery [phone] 03/30/2021, 7:42 AM

## 2021-03-30 NOTE — Progress Notes (Signed)
Physical Therapy Treatment Patient Details Name: Jennifer [name] MRN: [id number] DOB: 1976-09-10 Today's Date: 03/30/2021    History of Present Illness Patient is 45 y.o. female s/p Rt TKA on 03/28/21 with PMH significant for OA, RA, HTN, GERD.    PT Comments    Instructed pt in TKA HEP. She demonstrates good understanding of HEP. Her R quadriceps muscles remain weak (pt stated they were weak prior to surgery), she requires assistance for R SLR and for R knee extension exercises. She is ready to DC home from PT standpoint.    Follow Up Recommendations  Follow surgeon's recommendation for DC plan and follow-up therapies     Equipment Recommendations  Rolling walker with 5" wheels;3in1 (PT) (if covered by insurance)    Recommendations for Other Services       Precautions / Restrictions Precautions Precautions: Fall;Knee Precaution Booklet Issued: Yes (comment) Precaution Comments: reviewed no pillow under knee Restrictions Weight Bearing Restrictions: No Other Position/Activity Restrictions: WBAT    Mobility  Bed Mobility Overal bed mobility: Modified Independent Bed Mobility: Supine to Sit     Supine to sit: Modified independent (Device/Increase time);HOB elevated                   Wheelchair Mobility    Modified Rankin (Stroke Patients Only)       Balance Overall balance assessment: Needs assistance Sitting-balance support: Feet supported Sitting balance-Leahy Scale: Good     Standing balance support: During functional activity;Bilateral upper extremity supported Standing balance-Leahy Scale: Poor                              Cognition Arousal/Alertness: Awake/alert Behavior During Therapy: WFL for tasks assessed/performed Overall Cognitive Status: Within Functional Limits for tasks assessed                                 General Comments: pt teary at times, worried that she won't be able to achieve full extension with  her knee. Since meniscus surgery a year and 1/2 ago she's lacked full extension.      Exercises Total Joint Exercises Ankle Circles/Pumps: AROM;Both;10 reps;Supine Quad Sets: AROM;Right;5 reps;Seated Short Arc Quad: AAROM;Right;10 reps;Supine Heel Slides: AAROM;Right;10 reps;Supine Hip ABduction/ADduction: AROM;Right;10 reps;Supine Straight Leg Raises: AAROM;Right;5 reps;Supine Long Arc Quad: AAROM;Right;10 reps;Seated Knee Flexion: AAROM;Right;10 reps;Seated Goniometric ROM: 10-65* AAROM R knee    General Comments        Pertinent Vitals/Pain Pain Score: 6  Pain Location: Rt thigh Pain Descriptors / Indicators: Aching;Discomfort;Grimacing;Guarding Pain Intervention(s): Limited activity within patient's tolerance;Monitored during session;Premedicated before session;Ice applied    Home Living                      Prior Function            PT Goals (current goals can now be found in the care plan section) Acute Rehab PT Goals Patient Stated Goal: be able to carry her luggage on an airplane to travel again PT Goal Formulation: With patient Time For Goal Achievement: 04/04/21 Potential to Achieve Goals: Good Progress towards PT goals: Progressing toward goals    Frequency    7X/week      PT Plan Current plan remains appropriate    Co-evaluation              AM-PAC PT "6 Clicks" Mobility   Outcome  Measure  Help needed turning from your back to your side while in a flat bed without using bedrails?: A Little Help needed moving from lying on your back to sitting on the side of a flat bed without using bedrails?: A Little Help needed moving to and from a bed to a chair (including a wheelchair)?: None Help needed standing up from a chair using your arms (e.g., wheelchair or bedside chair)?: None Help needed to walk in hospital room?: None Help needed climbing 3-5 steps with a railing? : A Little 6 Click Score: 21    End of Session Equipment Utilized  During Treatment: Gait belt Activity Tolerance: Patient tolerated treatment well Patient left: with call bell/phone within reach;in bed;with family/visitor present Nurse Communication: Mobility status PT Visit Diagnosis: Muscle weakness (generalized) (M62.81);Difficulty in walking, not elsewhere classified (R26.2)     Time: 1315-1330 PT Time Calculation (min) (ACUTE ONLY): 15 min  Charges:  $Therapeutic Exercise: 8-22 mins                    Blondell Reveal Kistler PT 03/30/2021  Acute Rehabilitation Services Pager [phone] Office [phone]

## 2021-03-30 NOTE — Plan of Care (Signed)
  Problem: Education: Goal: Knowledge of General Education information will improve Description: Including pain rating scale, medication(s)/side effects and non-pharmacologic comfort measures Outcome: Adequate for Discharge   Problem: Health Behavior/Discharge Planning: Goal: Ability to manage health-related needs will improve Outcome: Adequate for Discharge   Problem: Clinical Measurements: Goal: Ability to maintain clinical measurements within normal limits will improve Outcome: Adequate for Discharge Goal: Will remain free from infection Outcome: Adequate for Discharge Goal: Diagnostic test results will improve Outcome: Adequate for Discharge Goal: Cardiovascular complication will be avoided Outcome: Adequate for Discharge   Problem: Activity: Goal: Risk for activity intolerance will decrease Outcome: Adequate for Discharge   Problem: Nutrition: Goal: Adequate nutrition will be maintained Outcome: Adequate for Discharge   Problem: Coping: Goal: Level of anxiety will decrease Outcome: Adequate for Discharge   Problem: Elimination: Goal: Will not experience complications related to bowel motility Outcome: Adequate for Discharge Goal: Will not experience complications related to urinary retention Outcome: Adequate for Discharge   Problem: Pain Managment: Goal: General experience of comfort will improve Outcome: Adequate for Discharge   Problem: Safety: Goal: Ability to remain free from injury will improve Outcome: Adequate for Discharge   Problem: Education: Goal: Knowledge of the prescribed therapeutic regimen will improve Outcome: Adequate for Discharge Goal: Individualized Educational Video(s) Outcome: Adequate for Discharge   Problem: Activity: Goal: Ability to avoid complications of mobility impairment will improve Outcome: Adequate for Discharge Goal: Range of joint motion will improve Outcome: Adequate for Discharge   Problem: Clinical  Measurements: Goal: Postoperative complications will be avoided or minimized Outcome: Adequate for Discharge   Problem: Pain Management: Goal: Pain level will decrease with appropriate interventions Outcome: Adequate for Discharge   Problem: Acute Rehab PT Goals(only PT should resolve) Goal: Pt Will Go Supine/Side To Sit Outcome: Adequate for Discharge Goal: Patient Will Transfer Sit To/From Stand Outcome: Adequate for Discharge Goal: Pt Will Ambulate Outcome: Adequate for Discharge  Pt discharged with family.  Discharge teaching done, written information given.

## 2021-04-02 NOTE — Discharge Summary (Signed)
Physician Discharge Summary   Patient ID: TAJANAE GUILBAULT MRN: [id number] DOB/AGE: [birth year] 45 y.o.  Admit date: 03/28/2021 Discharge date: 03/30/2021  Primary Diagnosis: Osteoarthritis, right knee   Admission Diagnoses:  Past Medical History:  Diagnosis Date   Acute otitis media 08/28/2012   improved  change to liquid medication    Breast abscess of female    Recurrent   Family history of adverse reaction to anesthesia    father hard time waking once (12/20/2016)   Fibroids    w/bleeding   GERD (gastroesophageal reflux disease)    History of blood transfusion    after surgery   Hypertension    Migraines    Dr. Orie Rout; "I have a few/month" (12/20/2016)   Osteoarthritis    Pill esophagitis 08/28/2012   amoxicillin  by hx  disc plan nl voice except hoarse not drooling  close fu  if not getting better with plan stop aleve  liquid ibu onlyf necessary    Recurrent periodic urticaria    Rheumatoid arthritis ([place])    "55% of my body" (12/20/2016)   Rheumatoid arthritis ([place])    Sleep apnea    wears cpap   Discharge Diagnoses:   Principal Problem:   OA (osteoarthritis) of knee Active Problems:   Primary osteoarthritis of right knee  Estimated body mass index is 39.47 kg/m as calculated from the following:   Height as of this encounter: 5\' 4"  (1.626 m).   Weight as of this encounter: 104.3 kg.  Procedure:  Procedure(s) (LRB): TOTAL KNEE ARTHROPLASTY (Right)   Consults: None  HPI: Jennifer [name] is a 45 y.o. year old female with end stage OA of her right knee with progressively worsening pain and dysfunction. She has constant pain, with activity and at rest and significant functional deficits with difficulties even with ADLs. She has had extensive non-op management including analgesics, injections of cortisone and viscosupplements, and home exercise program, but remains in significant pain with significant dysfunction.Radiographs show bone on bone arthritis medial and  patellofemoral. She presents now for right Total Knee Arthroplasty.    Laboratory Data: Admission on 03/28/2021, Discharged on 03/30/2021  Component Date Value Ref Range Status   Preg Test, Ur 03/28/2021 NEGATIVE  NEGATIVE Final   Comment:        THE SENSITIVITY OF THIS METHODOLOGY IS >20 mIU/mL. Performed at [facility], [place] [address], [place], [place] [address]    WBC 03/29/2021 7.9  4.0 - 10.5 K/uL Final   RBC 03/29/2021 3.52 (A) 3.87 - 5.11 MIL/uL Final   Hemoglobin 03/29/2021 10.8 (A) 12.0 - 15.0 g/dL Final   HCT 03/29/2021 33.4 (A) 36.0 - 46.0 % Final   MCV 03/29/2021 94.9  80.0 - 100.0 fL Final   MCH 03/29/2021 30.7  26.0 - 34.0 pg Final   MCHC 03/29/2021 32.3  30.0 - 36.0 g/dL Final   RDW 03/29/2021 13.5  11.5 - 15.5 % Final   Platelets 03/29/2021 243  150 - 400 K/uL Final   nRBC 03/29/2021 0.0  0.0 - 0.2 % Final   Performed at [facility], [place] [address] [place], [place] [address]   Sodium 03/29/2021 136  135 - 145 mmol/L Final   Potassium 03/29/2021 4.0  3.5 - 5.1 mmol/L Final   Chloride 03/29/2021 105  98 - 111 mmol/L Final   CO2 03/29/2021 26  22 - 32 mmol/L Final   Glucose, Bld 03/29/2021 192 (A) 70 - 99 mg/dL Final  Glucose reference range applies only to samples taken after fasting for at least 8 hours.   BUN 03/29/2021 13  6 - 20 mg/dL Final   Creatinine, Ser 03/29/2021 0.73  0.44 - 1.00 mg/dL Final   Calcium 03/29/2021 8.6 (A) 8.9 - 10.3 mg/dL Final   GFR, Estimated 03/29/2021 >60  >60 mL/min Final   Comment: (NOTE) Calculated using the CKD-EPI Creatinine Equation (2021)    Anion gap 03/29/2021 5  5 - 15 Final   Performed at [facility], [place] [address], [place], [place] [address]   WBC 03/30/2021 9.8  4.0 - 10.5 K/uL Final   RBC 03/30/2021 3.09 (A) 3.87 - 5.11 MIL/uL Final   Hemoglobin 03/30/2021 9.3 (A) 12.0 - 15.0 g/dL Final   HCT 03/30/2021 29.1 (A) 36.0 - 46.0 % Final   MCV 03/30/2021 94.2   80.0 - 100.0 fL Final   MCH 03/30/2021 30.1  26.0 - 34.0 pg Final   MCHC 03/30/2021 32.0  30.0 - 36.0 g/dL Final   RDW 03/30/2021 13.9  11.5 - 15.5 % Final   Platelets 03/30/2021 243  150 - 400 K/uL Final   nRBC 03/30/2021 0.0  0.0 - 0.2 % Final   Performed at [facility], [place] [address] [place], [place] [address]   Sodium 03/30/2021 137  135 - 145 mmol/L Final   Potassium 03/30/2021 3.7  3.5 - 5.1 mmol/L Final   Chloride 03/30/2021 106  98 - 111 mmol/L Final   CO2 03/30/2021 26  22 - 32 mmol/L Final   Glucose, Bld 03/30/2021 126 (A) 70 - 99 mg/dL Final   Glucose reference range applies only to samples taken after fasting for at least 8 hours.   BUN 03/30/2021 11  6 - 20 mg/dL Final   Creatinine, Ser 03/30/2021 0.67  0.44 - 1.00 mg/dL Final   Calcium 03/30/2021 8.3 (A) 8.9 - 10.3 mg/dL Final   GFR, Estimated 03/30/2021 >60  >60 mL/min Final   Comment: (NOTE) Calculated using the CKD-EPI Creatinine Equation (2021)    Anion gap 03/30/2021 5  5 - 15 Final   Performed at [facility], [place] [address], [place], [place] [address]  Hospital Outpatient Visit on 03/24/2021  Component Date Value Ref Range Status   SARS Coronavirus 2 03/24/2021 NEGATIVE  NEGATIVE Final   Comment: (NOTE) SARS-CoV-2 target nucleic acids are NOT DETECTED.  The SARS-CoV-2 RNA is generally detectable in upper and lower respiratory specimens during the acute phase of infection. Negative results do not preclude SARS-CoV-2 infection, do not rule out co-infections with other pathogens, and should not be used as the sole basis for treatment or other patient management decisions. Negative results must be combined with clinical observations, patient history, and epidemiological information. The expected result is Negative.  Fact Sheet for Patients: SugarRoll.be  Fact Sheet for Healthcare Providers: https://www.woods-mathews.com/  This  test is not yet approved or cleared by the [place] FDA and  has been authorized for detection and/or diagnosis of SARS-CoV-2 by FDA under an Emergency Use Authorization (EUA). This EUA will remain  in effect (meaning this test can be used) for the duration of the COVID-19 declaration under Se                          ction 564(b)(1) of the Act, 21 U.S.C. section 360bbb-3(b)(1), unless the authorization is terminated or revoked sooner.  Performed at [facility], [place]  [address] [place], [place] [address]   Orders Only on 03/23/2021  Component Date Value Ref Range Status   QuantiFERON-TB Gold Plus 03/23/2021 NEGATIVE  NEGATIVE Final   Comment: Negative test result. M. tuberculosis complex  infection unlikely.    NIL 03/23/2021 0.06  IU/mL Final   Mitogen-NIL 03/23/2021 >10.00  IU/mL Final   TB1-NIL 03/23/2021 <0.00  IU/mL Final   TB2-NIL 03/23/2021 <0.00  IU/mL Final   Comment: . The Nil tube value reflects the background interferon gamma immune response of the patient's blood sample. This value has been subtracted from the patient's displayed TB and Mitogen results. . Lower than expected results with the Mitogen tube prevent false-negative Quantiferon readings by detecting a patient with a potential immune suppressive condition and/or suboptimal pre-analytical specimen handling. . The TB1 Antigen tube is coated with the M. tuberculosis-specific antigens designed to elicit responses from TB antigen primed CD4+ helper T-lymphocytes. . The TB2 Antigen tube is coated with the M. tuberculosis-specific antigens designed to elicit responses from TB antigen primed CD4+ helper and CD8+ cytotoxic T-lymphocytes. . For additional information, please refer to https://education.questdiagnostics.com/faq/FAQ204 (This link is being provided for informational/ educational purposes only.) .   Hospital Outpatient Visit on 03/16/2021  Component Date Value Ref Range Status    MRSA, PCR 03/16/2021 NEGATIVE  NEGATIVE Final   Staphylococcus aureus 03/16/2021 NEGATIVE  NEGATIVE Final   Comment: (NOTE) The Xpert SA Assay (FDA approved for NASAL specimens in patients 59 years of age and older), is one component of a comprehensive surveillance program. It is not intended to diagnose infection nor to guide or monitor treatment. Performed at [facility], [place] [address], [place], [place] [address]    WBC 03/16/2021 4.1  4.0 - 10.5 K/uL Final   RBC 03/16/2021 3.92  3.87 - 5.11 MIL/uL Final   Hemoglobin 03/16/2021 11.9 (A) 12.0 - 15.0 g/dL Final   HCT 03/16/2021 36.9  36.0 - 46.0 % Final   MCV 03/16/2021 94.1  80.0 - 100.0 fL Final   MCH 03/16/2021 30.4  26.0 - 34.0 pg Final   MCHC 03/16/2021 32.2  30.0 - 36.0 g/dL Final   RDW 03/16/2021 13.3  11.5 - 15.5 % Final   Platelets 03/16/2021 274  150 - 400 K/uL Final   nRBC 03/16/2021 0.0  0.0 - 0.2 % Final   Performed at [place] [address] [place], [place] [address]   Sodium 03/16/2021 138  135 - 145 mmol/L Final   Potassium 03/16/2021 4.4  3.5 - 5.1 mmol/L Final   Chloride 03/16/2021 106  98 - 111 mmol/L Final   CO2 03/16/2021 27  22 - 32 mmol/L Final   Glucose, Bld 03/16/2021 86  70 - 99 mg/dL Final   Glucose reference range applies only to samples taken after fasting for at least 8 hours.   BUN 03/16/2021 17  6 - 20 mg/dL Final   Creatinine, Ser 03/16/2021 0.85  0.44 - 1.00 mg/dL Final   Calcium 03/16/2021 9.1  8.9 - 10.3 mg/dL Final   Total Protein 03/16/2021 7.8  6.5 - 8.1 g/dL Final   Albumin 03/16/2021 3.8  3.5 - 5.0 g/dL Final   AST 03/16/2021 18  15 - 41 U/L Final   ALT 03/16/2021 16  0 - 44 U/L Final   Alkaline Phosphatase 03/16/2021 67  38 - 126 U/L Final   Total Bilirubin 03/16/2021 0.7  0.3 - 1.2 mg/dL Final   GFR, Estimated 03/16/2021 >60  >60  mL/min Final   Comment: (NOTE) Calculated using the CKD-EPI Creatinine Equation (2021)    Anion gap 03/16/2021  5  5 - 15 Final   Performed at [facility], [place] [address] [place], [place] [address]  Admission on 03/14/2021, Discharged on 03/14/2021  Component Date Value Ref Range Status   Color, UA 03/14/2021 yellow  yellow Final   Clarity, UA 03/14/2021 cloudy (A) clear Final   Glucose, UA 03/14/2021 negative  negative mg/dL Final   Bilirubin, UA 03/14/2021 small (A) negative Final   Ketones, POC UA 03/14/2021 trace (5) (A) negative mg/dL Final   Spec Grav, UA 03/14/2021 >=1.030 (A) 1.010 - 1.025 Final   Blood, UA 03/14/2021 negative  negative Final   pH, UA 03/14/2021 5.5  5.0 - 8.0 Final   Protein Ur, POC 03/14/2021 =30 (A) negative mg/dL Final   Urobilinogen, UA 03/14/2021 0.2  0.2 or 1.0 E.U./dL Final   Nitrite, UA 03/14/2021 Negative  Negative Final   Leukocytes, UA 03/14/2021 Negative  Negative Final   Bacterial Vaginitis (gardnerella) 03/14/2021 Positive (A)  Final   Candida Vaginitis 03/14/2021 Negative   Final   Candida Glabrata 03/14/2021 Negative   Final   Comment 03/14/2021 Normal Reference Range Candida Species - Negative   Final   Comment 03/14/2021 Normal Reference Range Candida Galbrata - Negative   Final   Comment 03/14/2021 Normal Reference Range Bacterial Vaginosis - Negative   Final  Office Visit on 02/28/2021  Component Date Value Ref Range Status   INR 02/28/2021 1.1 (A) 0.8 - 1.0 ratio Final   Prothrombin Time 02/28/2021 11.9  9.6 - 13.1 sec Final   Hgb A1c MFr Bld 02/28/2021 5.7  4.6 - 6.5 % Final   Glycemic Control Guidelines for People with Diabetes:Non Diabetic:  <6%Goal of Therapy: <7%Additional Action Suggested:  >8%    Total Bilirubin 02/28/2021 0.4  0.2 - 1.2 mg/dL Final   Bilirubin, Direct 02/28/2021 0.1  0.0 - 0.3 mg/dL Final   Alkaline Phosphatase 02/28/2021 71  39 - 117 U/L Final   AST 02/28/2021 17  0 - 37 U/L Final   ALT 02/28/2021 11  0 - 35 U/L Final   Total Protein 02/28/2021 8.2  6.0 - 8.3 g/dL Final   Albumin 02/28/2021 4.1  3.5  - 5.2 g/dL Final   WBC 02/28/2021 4.8  4.0 - 10.5 K/uL Final   RBC 02/28/2021 4.02  3.87 - 5.11 Mil/uL Final   Hemoglobin 02/28/2021 12.3  12.0 - 15.0 g/dL Final   HCT 02/28/2021 36.8  36.0 - 46.0 % Final   MCV 02/28/2021 91.4  78.0 - 100.0 fl Final   MCHC 02/28/2021 33.4  30.0 - 36.0 g/dL Final   RDW 02/28/2021 13.0  11.5 - 15.5 % Final   Platelets 02/28/2021 263.0  150.0 - 400.0 K/uL Final   Neutrophils Relative % 02/28/2021 32.5 (A) 43.0 - 77.0 % Final   Lymphocytes Relative 02/28/2021 52.9 (A) 12.0 - 46.0 % Final   Monocytes Relative 02/28/2021 11.4  3.0 - 12.0 % Final   Eosinophils Relative 02/28/2021 2.1  0.0 - 5.0 % Final   Basophils Relative 02/28/2021 1.1  0.0 - 3.0 % Final   Neutro Abs 02/28/2021 1.5  1.4 - 7.7 K/uL Final   Lymphs Abs 02/28/2021 2.5  0.7 - 4.0 K/uL Final   Monocytes Absolute 02/28/2021 0.5  0.1 - 1.0 K/uL Final   Eosinophils Absolute 02/28/2021 0.1  0.0 - 0.7 K/uL Final   Basophils Absolute 02/28/2021  0.1  0.0 - 0.1 K/uL Final   Sodium 02/28/2021 140  135 - 145 mEq/L Final   Potassium 02/28/2021 4.2  3.5 - 5.1 mEq/L Final   Chloride 02/28/2021 104  96 - 112 mEq/L Final   CO2 02/28/2021 30  19 - 32 mEq/L Final   Glucose, Bld 02/28/2021 112 (A) 70 - 99 mg/dL Final   BUN 02/28/2021 19  6 - 23 mg/dL Final   Creatinine, Ser 02/28/2021 0.89  0.40 - 1.20 mg/dL Final   GFR 02/28/2021 78.15  >60.00 mL/min Final   Calculated using the CKD-EPI Creatinine Equation (2021)   Calcium 02/28/2021 9.5  8.4 - 10.5 mg/dL Final   Color, UA 02/28/2021 yellow   Final   Clarity, UA 02/28/2021 clear   Final   Glucose, UA 02/28/2021 Negative  Negative Final   Bilirubin, UA 02/28/2021 Negative   Final   Ketones, UA 02/28/2021 Negative   Final   Spec Grav, UA 02/28/2021 1.025  1.010 - 1.025 Final   Blood, UA 02/28/2021 Negative   Final   pH, UA 02/28/2021 6.0  5.0 - 8.0 Final   Protein, UA 02/28/2021 Positive (A) Negative Final   Urobilinogen, UA 02/28/2021 0.2  0.2 or 1.0  E.U./dL Final   Nitrite, UA 02/28/2021 Negative   Final   Leukocytes, UA 02/28/2021 Negative  Negative Final     X-Rays:No results found.  EKG: Orders placed or performed in visit on 06/11/18   EKG 12-Lead     Hospital Course: Jennifer [name] is a 45 y.o. who was admitted to [facility]. They were brought to the operating room on 03/28/2021 and underwent Procedure(s): TOTAL KNEE ARTHROPLASTY.  Patient tolerated the procedure well and was later transferred to the recovery room and then to the orthopaedic floor for postoperative care. They were given PO and IV analgesics for pain control following their surgery. They were given 24 hours of postoperative antibiotics of  Anti-infectives (From admission, onward)    Start     Dose/Rate Route Frequency Ordered Stop   03/28/21 1800  ceFAZolin (ANCEF) 2 g in sodium chloride 0.9 % 100 mL IVPB        2 g 200 mL/hr over 30 Minutes Intravenous Every 6 hours 03/28/21 1519 03/29/21 0058   03/28/21 0945  ceFAZolin (ANCEF) IVPB 2g/100 mL premix        2 g 200 mL/hr over 30 Minutes Intravenous On call to O.R. 03/28/21 0932 03/28/21 1143      and started on DVT prophylaxis in the form of Aspirin.   PT and OT were ordered for total joint protocol. Discharge planning consulted to help with postop disposition and equipment needs. Patient had a good night on the evening of surgery. They started to get up OOB with therapy on POD #0. Had issues with pain control in the thigh, prompting an additional night stay. Continued to work with therapy into POD #2. Pt was seen during rounds on day two and was ready to go home pending progress with therapy. Dressing was changed and the incision was clean, dry, and intact. Pt worked with therapy for two additional sessions and was meeting their goals. She was discharged to home later that day in stable condition.  Diet: Regular diet Activity: WBAT Follow-up: in 2 weeks Disposition: Home with OPPT Discharged  Condition: stable   Discharge Instructions     Call MD / Call 911   Complete by: As directed    If you experience chest  pain or shortness of breath, CALL 911 and be transported to the hospital emergency room.  If you develope a fever above 101 F, pus (white drainage) or increased drainage or redness at the wound, or calf pain, call your surgeon's office.   Change dressing   Complete by: As directed    You may remove the bulky bandage (ACE wrap and gauze) two days after surgery. You will have an adhesive waterproof bandage underneath. Leave this in place until your first follow-up appointment.   Constipation Prevention   Complete by: As directed    Drink plenty of fluids.  Prune juice may be helpful.  You may use a stool softener, such as Colace (over the counter) 100 mg twice a day.  Use MiraLax (over the counter) for constipation as needed.   Diet - low sodium heart healthy   Complete by: As directed    Do not put a pillow under the knee. Place it under the heel.   Complete by: As directed    Driving restrictions   Complete by: As directed    No driving for two weeks   Post-operative opioid taper instructions:   Complete by: As directed    POST-OPERATIVE OPIOID TAPER INSTRUCTIONS: It is important to wean off of your opioid medication as soon as possible. If you do not need pain medication after your surgery it is ok to stop day one. Opioids include: Codeine, Hydrocodone(Norco, Vicodin), Oxycodone(Percocet, oxycontin) and hydromorphone amongst others.  Long term and even short term use of opiods can cause: Increased pain response Dependence Constipation Depression Respiratory depression And more.  Withdrawal symptoms can include Flu like symptoms Nausea, vomiting And more Techniques to manage these symptoms Hydrate well Eat regular healthy meals Stay active Use relaxation techniques(deep breathing, meditating, yoga) Do Not substitute Alcohol to help with tapering If you  have been on opioids for less than two weeks and do not have pain than it is ok to stop all together.  Plan to wean off of opioids This plan should start within one week post op of your joint replacement. Maintain the same interval or time between taking each dose and first decrease the dose.  Cut the total daily intake of opioids by one tablet each day Next start to increase the time between doses. The last dose that should be eliminated is the evening dose.      TED hose   Complete by: As directed    Use stockings (TED hose) for three weeks on both leg(s).  You may remove them at night for sleeping.   Weight bearing as tolerated   Complete by: As directed       Allergies as of 03/30/2021       Reactions   Lisinopril Anaphylaxis   Lisinopril-hydrochlorothiazide Swelling, Other (See Comments)   Angioedema  Has tolerated maxide in past so most likely  The ACE inhibitor as the cause         Medication List     STOP taking these medications    diclofenac sodium 1 % Gel Commonly known as: VOLTAREN       TAKE these medications    amLODipine 5 MG tablet Commonly known as: NORVASC TAKE 1 TABLET BY MOUTH EVERY DAY What changed: additional instructions   aspirin 325 MG tablet Take 1 tablet (325 mg total) by mouth 2 (two) times daily.   cetirizine 10 MG tablet Commonly known as: ZYRTEC Take 10 mg by mouth daily as needed for allergies. What  changed: Another medication with the same name was removed. Continue taking this medication, and follow the directions you see here.   gabapentin 300 MG capsule Commonly known as: NEURONTIN Take a 300 mg capsule three times a day for two weeks following surgery.Then take a 300 mg capsule two times a day for two weeks. Then take a 300 mg capsule once a day for two weeks. Then discontinue.   Kyleena 19.5 MG IUD Generic drug: levonorgestrel 19.5 mg by Intrauterine route once.   Melatonin 10 MG Caps Take 20 mg by mouth at bedtime.    methocarbamol 500 MG tablet Commonly known as: ROBAXIN Take 1 tablet (500 mg total) by mouth every 6 (six) hours as needed for muscle spasms.   NON FORMULARY Pt uses cpap nightly   oxyCODONE 5 MG immediate release tablet Commonly known as: Oxy IR/ROXICODONE Take 1-2 tablets (5-10 mg total) by mouth every 6 (six) hours as needed for severe pain.   prednisoLONE acetate 1 % ophthalmic suspension Commonly known as: PRED FORTE Place 1 drop into the left eye in the morning, at noon, and at bedtime. 4 x daily per pt on 03/16/21   traMADol 50 MG tablet Commonly known as: ULTRAM Take 1-2 tablets (50-100 mg total) by mouth every 6 (six) hours as needed for moderate pain.               Discharge Care Instructions  (From admission, onward)           Start     Ordered   03/29/21 0000  Weight bearing as tolerated        03/29/21 0854   03/29/21 0000  Change dressing       Comments: You may remove the bulky bandage (ACE wrap and gauze) two days after surgery. You will have an adhesive waterproof bandage underneath. Leave this in place until your first follow-up appointment.   03/29/21 0854            Follow-up Information     Gaynelle Arabian, MD Follow up.   Specialty: Orthopedic Surgery Why: You are scheduled for a follow-up appointment on 04/12/21 at 3:15 pm Contact information: [address] [place] [address] [phone]                 Signed: Theresa Duty, PA-C Orthopedic Surgery 04/02/2021, 8:54 AM

## 2021-04-07 NOTE — Telephone Encounter (Signed)
PA required for Orthovisc. Faxed request to [facility] 04/07/21.

## 2021-04-07 NOTE — Telephone Encounter (Signed)
Please call to schedule Visco knee injections.  Authorized for Orthovisc left knee. Buy and Bill. Deductible applies, and has been met. Pa for Orthovisc thru [employer]. Authorization # [id number]  effective: 04/11/2021-04/10/2022. Insurance to cover 85% of allowable cost. Patient responsible for 15%. Once OOP is met, insurance to cover 100%.  ( As of date $3889.45 has been met of $5000. ) No copay required. 

## 2021-04-08 NOTE — Telephone Encounter (Signed)
I spoke with patient, and advised her of her benefits for Visco. Patient would like to be cleared from her right TKR before scheduling Visco. Patient will call after her next appointment with her Orthopedic.

## 2021-04-29 ENCOUNTER — Encounter: Payer: Self-pay | Admitting: Nurse Practitioner

## 2021-04-29 ENCOUNTER — Telehealth: Payer: 59 | Admitting: Nurse Practitioner

## 2021-04-29 VITALS — BP 129/87 | HR 69

## 2021-04-29 DIAGNOSIS — R42 Dizziness and giddiness: Secondary | ICD-10-CM | POA: Diagnosis not present

## 2021-04-29 NOTE — Progress Notes (Signed)
Virtual Visit Consent   [name], you are scheduled for a virtual visit with a [facility] provider today.     Just as with appointments in the office, your consent must be obtained to participate.  Your consent will be active for this visit and any virtual visit you may have with one of our providers in the next 365 days.     If you have a MyChart account, a copy of this consent can be sent to you electronically.  All virtual visits are billed to your insurance company just like a traditional visit in the office.    As this is a virtual visit, video technology does not allow for your provider to perform a traditional examination.  This may limit your provider's ability to fully assess your condition.  If your provider identifies any concerns that need to be evaluated in person or the need to arrange testing (such as labs, EKG, etc.), we will make arrangements to do so.     Although advances in technology are sophisticated, we cannot ensure that it will always work on either your end or our end.  If the connection with a video visit is poor, the visit may have to be switched to a telephone visit.  With either a video or telephone visit, we are not always able to ensure that we have a secure connection.     I need to obtain your verbal consent now.   Are you willing to proceed with your visit today?    Jennifer [name] has provided verbal consent on 04/29/2021 for a virtual visit (video or telephone).   Apolonio Schneiders, FNP   Date: 04/29/2021 9:29 AM   Virtual Visit via Video Note   I, Apolonio Schneiders, connected with  Jennifer [name]  ([id number], Mar 28, 1976) on 04/29/21 at  9:30 AM EDT by a video-enabled telemedicine application and verified that I am speaking with the correct person using two identifiers.  Location: Patient: Virtual Visit Location Patient: Home Provider: Virtual Visit Location Provider: Office/Clinic   I discussed the limitations of evaluation and management by telemedicine  and the availability of in person appointments. The patient expressed understanding and agreed to proceed.    History of Present Illness: Jennifer [name] is a 45 y.o. who identifies as a female who was assigned female at birth, and is being seen today with complaints of dizziness that started in the middle of the night. She woke up and felt like the room was spinning.   She waiting for a friend to come over this morning before she got out of bed. She is still feeling dizzy this morning. She recently had a knee replacement.   Yesterday she took her blood pressure medication Tramadol and her baby ASA only.   She has been alternating between Tramadol and Oxycodone for pain, she uses Oxycodone prior to PT only and does not take that on the same day as tramadol.   03/28/2021 was her knee replacement.   Yesterday she had a jimmy deans breakfast and coffee, popcorn, carrots, and a [employer] bar. She feels like she drank around 24 ounces of water. She did have PT as well.   She has slight nausea but admits she has an empty stomach, denies body aches or a fever.   She denies a history of low blood sugar levels. She admits that she was off her eating yesterday it is not normal for her to skip meals.   Denies headache, weakness, numbness  or pain throughout her body. Denies any SOB or difficulty or pain with deep breaths. Denies pain in her extremities.   Today's Vitals   04/29/21 1215  BP: 129/87  Pulse: 69   There is no height or weight on file to calculate BMI.   Problems:  Patient Active Problem List   Diagnosis Date Noted   Primary osteoarthritis of right knee 03/28/2021   OA (osteoarthritis) of knee 04/02/2020   Pain in right knee 01/13/2020   Chronic pain of right knee 01/21/2019   S/P right knee arthroscopy 07/23/2018   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   Primary osteoarthritis of both hands 02/04/2018   Sleep apnea, obstructive 09/28/2017   Primary osteoarthritis of both knees 05/22/2017    Incisional hernia 12/18/2016   Leiomyoma of uterus 09/06/2016   Fibroid, uterine    Essential hypertension 05/25/2015   Recurrent headache 05/25/2015   Head lump 07/31/2014   Edema 12/29/2013   Baker's cyst, ruptured 12/25/2013   Cough, persistent 12/12/2013   Left leg swelling 12/12/2013   Cough 12/12/2013   High risk medication use 12/12/2013   Recurrent periodic urticaria    Rheumatoid arthritis ([place]) 08/28/2012   CHRONIC LARYNGITIS 11/03/2010   ALLERGIC RHINITIS 11/03/2010   PARESTHESIA 07/28/2010   DYSMENORRHEA 10/15/2007   FIBROIDS, UTERUS 07/24/2007   OBESITY 07/24/2007   SLEEPLESSNESS 07/24/2007   HEADACHE 07/24/2007    Allergies:  Allergies  Allergen Reactions   Lisinopril Anaphylaxis   Lisinopril-Hydrochlorothiazide Swelling and Other (See Comments)    Angioedema  Has tolerated maxide in past so most likely  The ACE inhibitor as the cause    Medications:  Current Outpatient Medications:    amLODipine (NORVASC) 5 MG tablet, TAKE 1 TABLET BY MOUTH EVERY DAY, Disp: 90 tablet, Rfl: 1   aspirin 325 MG tablet, Take 1 tablet (325 mg total) by mouth 2 (two) times daily., Disp: 40 tablet, Rfl: 0   cetirizine (ZYRTEC) 10 MG tablet, Take 10 mg by mouth daily as needed for allergies., Disp: , Rfl:    gabapentin (NEURONTIN) 300 MG capsule, Take a 300 mg capsule three times a day for two weeks following surgery.Then take a 300 mg capsule two times a day for two weeks. Then take a 300 mg capsule once a day for two weeks. Then discontinue., Disp: 84 capsule, Rfl: 0   Levonorgestrel (KYLEENA) 19.5 MG IUD, 19.5 mg by Intrauterine route once., Disp: , Rfl:    Melatonin 10 MG CAPS, Take 20 mg by mouth at bedtime., Disp: , Rfl:    methocarbamol (ROBAXIN) 500 MG tablet, Take 1 tablet (500 mg total) by mouth every 6 (six) hours as needed for muscle spasms., Disp: 40 tablet, Rfl: 0   NON FORMULARY, Pt uses cpap nightly, Disp: , Rfl:    oxyCODONE (OXY IR/ROXICODONE) 5 MG immediate release  tablet, Take 1-2 tablets (5-10 mg total) by mouth every 6 (six) hours as needed for severe pain., Disp: 42 tablet, Rfl: 0   prednisoLONE acetate (PRED FORTE) 1 % ophthalmic suspension, Place 1 drop into the left eye in the morning, at noon, and at bedtime. 4 x daily per pt on 03/16/21, Disp: , Rfl:    traMADol (ULTRAM) 50 MG tablet, Take 1-2 tablets (50-100 mg total) by mouth every 6 (six) hours as needed for moderate pain., Disp: 40 tablet, Rfl: 0  Observations/Objective: Patient is well-developed, well-nourished in no acute distress.  Resting comfortably at home.  Head is normocephalic, atraumatic.  No labored breathing.  Speech is  clear and coherent with logical content.  Patient is alert and oriented at baseline.   Face symmetrical, tongue midline, able to shrug shoulders, no arm droop. Alert and Oriented x3   Assessment and Plan: Assumed low blood sugar from missed meals yesterday and dehydration. Patient will have breakfast, hydrate and take BP and follow up with provider when completing these tasks. If no improvement in symptoms will go to UC for evaluation as discussed.   Sent mychart message to patient requesting UC visit if symptoms persist or with any new or worsening symptoms as well   Follow Up Instructions: I discussed the assessment and treatment plan with the patient. The patient was provided an opportunity to ask questions and all were answered. The patient agreed with the plan and demonstrated an understanding of the instructions.  A copy of instructions were sent to the patient via MyChart.  The patient was advised to call back or seek an in-person evaluation if the symptoms worsen or if the condition fails to improve as anticipated.  Time:  I spent 20 minutes with the patient via telehealth technology discussing the above problems/concerns.    Apolonio Schneiders, FNP

## 2021-04-30 ENCOUNTER — Ambulatory Visit: Payer: Self-pay

## 2021-06-01 ENCOUNTER — Other Ambulatory Visit: Payer: Self-pay | Admitting: *Deleted

## 2021-06-01 DIAGNOSIS — Z79899 Other long term (current) drug therapy: Secondary | ICD-10-CM

## 2021-06-02 ENCOUNTER — Other Ambulatory Visit: Payer: Self-pay

## 2021-06-02 ENCOUNTER — Ambulatory Visit (INDEPENDENT_AMBULATORY_CARE_PROVIDER_SITE_OTHER): Payer: 59 | Admitting: *Deleted

## 2021-06-02 VITALS — BP 137/83 | HR 63

## 2021-06-02 DIAGNOSIS — M0579 Rheumatoid arthritis with rheumatoid factor of multiple sites without organ or systems involvement: Secondary | ICD-10-CM

## 2021-06-02 LAB — COMPLETE METABOLIC PANEL WITH GFR
AG Ratio: 1 (calc) (ref 1.0–2.5)
ALT: 7 U/L (ref 6–29)
AST: 16 U/L (ref 10–35)
Albumin: 4.1 g/dL (ref 3.6–5.1)
Alkaline phosphatase (APISO): 87 U/L (ref 31–125)
BUN: 16 mg/dL (ref 7–25)
CO2: 27 mmol/L (ref 20–32)
Calcium: 9.3 mg/dL (ref 8.6–10.2)
Chloride: 104 mmol/L (ref 98–110)
Creat: 0.89 mg/dL (ref 0.50–0.99)
Globulin: 4.2 g/dL (calc) — ABNORMAL HIGH (ref 1.9–3.7)
Glucose, Bld: 87 mg/dL (ref 65–99)
Potassium: 4.9 mmol/L (ref 3.5–5.3)
Sodium: 139 mmol/L (ref 135–146)
Total Bilirubin: 0.4 mg/dL (ref 0.2–1.2)
Total Protein: 8.3 g/dL — ABNORMAL HIGH (ref 6.1–8.1)
eGFR: 81 mL/min/{1.73_m2} (ref >=60)

## 2021-06-02 MED ORDER — CERTOLIZUMAB PEGOL 2 X 200 MG ~~LOC~~ KIT
400.0000 mg | PACK | Freq: Once | SUBCUTANEOUS | Status: AC
  Administered 2021-06-02: 400 mg via SUBCUTANEOUS

## 2021-06-02 NOTE — Progress Notes (Signed)
CMP is normal.  Protein is mildly elevated.  Calcium is normal now.  We will continue to monitor.

## 2021-06-02 NOTE — Progress Notes (Signed)
Pharmacy Note  Subjective:   Patient presents to clinic today to receive monthly dose of Cimzia.  Patient running a fever or have signs/symptoms of infection? No  Patient currently on antibiotics for the treatment of infection? No  Patient have any upcoming invasive procedures/surgeries? No  Objective: CMP     Component Value Date/Time   NA 139 06/01/2021 1539   NA 138 06/11/2018 1212   K 4.9 06/01/2021 1539   CL 104 06/01/2021 1539   CO2 27 06/01/2021 1539   GLUCOSE 87 06/01/2021 1539   BUN 16 06/01/2021 1539   BUN 9 06/11/2018 1212   CREATININE 0.89 06/01/2021 1539   CALCIUM 9.3 06/01/2021 1539   PROT 8.3 (H) 06/01/2021 1539   PROT 8.0 06/11/2018 1212   ALBUMIN 3.8 03/16/2021 1417   ALBUMIN 3.8 06/11/2018 1212   AST 16 06/01/2021 1539   ALT 7 06/01/2021 1539   ALKPHOS 67 03/16/2021 1417   BILITOT 0.4 06/01/2021 1539   BILITOT 0.3 06/11/2018 1212   GFRNONAA >60 03/30/2021 0310   GFRNONAA 72 07/02/2020 1106   GFRAA 84 07/02/2020 1106    CBC    Component Value Date/Time   WBC 9.8 03/30/2021 0310   RBC 3.09 (L) 03/30/2021 0310   HGB 9.3 (L) 03/30/2021 0310   HGB 11.9 06/11/2018 1212   HCT 29.1 (L) 03/30/2021 0310   HCT 36.6 06/11/2018 1212   PLT 243 03/30/2021 0310   PLT 310 06/11/2018 1212   MCV 94.2 03/30/2021 0310   MCV 93 06/11/2018 1212   MCH 30.1 03/30/2021 0310   MCHC 32.0 03/30/2021 0310   RDW 13.9 03/30/2021 0310   RDW 14.4 06/11/2018 1212   LYMPHSABS 2.5 02/28/2021 1614   LYMPHSABS 2.3 06/11/2018 1212   MONOABS 0.5 02/28/2021 1614   EOSABS 0.1 02/28/2021 1614   EOSABS 0.2 06/11/2018 1212   BASOSABS 0.1 02/28/2021 1614   BASOSABS 0.0 06/11/2018 1212    Baseline Immunosuppressant Therapy Labs TB GOLD Quantiferon TB Gold Latest Ref Rng & Units 03/23/2021  Quantiferon TB Gold Plus NEGATIVE NEGATIVE   Hepatitis Panel Hepatitis Latest Ref Rng & Units 06/13/2019  Hep B Surface Ag NON-REACTI NON-REACTIVE  Hep B IgM NON-REACTI NON-REACTIVE  Hep C  Ab NON-REACTI NON-REACTIVE  Hep C Ab NON-REACTI NON-REACTIVE  Hep A IgM NON-REACTI NON-REACTIVE   HIV Lab Results  Component Value Date   HIV NON-REACTIVE 02/18/2020   Immunoglobulins Immunoglobulin Electrophoresis Latest Ref Rng & Units 02/18/2020  IgA  47 - 310 mg/dL 651(H)  IgG 600 - 1,640 mg/dL 2,218(H)  IgM 50 - 300 mg/dL 78   SPEP Serum Protein Electrophoresis Latest Ref Rng & Units 06/01/2021  Total Protein 6.1 - 8.1 g/dL 8.3(H)  Albumin 3.8 - 4.8 g/dL -  Alpha-1 0.2 - 0.3 g/dL -  Alpha-2 0.5 - 0.9 g/dL -  Beta Globulin 0.4 - 0.6 g/dL -  Beta 2 0.2 - 0.5 g/dL -  Gamma Globulin 0.8 - 1.7 g/dL -   G6PD No results found for: G6PDH TPMT No results found for: TPMT   Chest x-ray: 12/12/2013 No acute abnormalities.   Assessment/Plan:   Administrations This Visit     Certolizumab Pegol KIT 400 mg     Admin Date 06/02/2021 Action Given Dose 400 mg Route Subcutaneous Administered By Carole Binning, LPN            Patient tolerated injection well. Patient is restarting Cimzia. She has been off Cimzia since February 2022. Patient was monitored in the  office for 30 minutes after administration for adverse reactions. No adverse reactions noted.   Appointment for next injection scheduled for 06/30/2021.  Patient due for labs in December 2022.  Patient is to call and reschedule appointment if running a fever with signs/symptoms of infection, on antibiotics for active infection or has an upcoming invasive procedure.  All questions encouraged and answered.  Instructed patient to call with any further questions or concerns.

## 2021-06-15 ENCOUNTER — Ambulatory Visit (INDEPENDENT_AMBULATORY_CARE_PROVIDER_SITE_OTHER): Payer: 59 | Admitting: Podiatry

## 2021-06-15 ENCOUNTER — Other Ambulatory Visit: Payer: Self-pay

## 2021-06-15 DIAGNOSIS — L819 Disorder of pigmentation, unspecified: Secondary | ICD-10-CM | POA: Diagnosis not present

## 2021-06-15 DIAGNOSIS — L603 Nail dystrophy: Secondary | ICD-10-CM

## 2021-06-17 NOTE — Progress Notes (Signed)
Subjective: 45 year old female presents the office today for follow-up evaluation discoloration to her right second toe.  She says it still dark in color.  She is concerned with the appearance of the nail but no significant pain.  No swelling or redness or any drainage.  She has been using the urea cream.  Objective: AAO x3, NAD DP/PT pulses palpable 2/4 bilaterally, CRT less than 3 seconds Right second digit toenail has grown back in and still dark in color.  There is no extension any hyperpigmentation surrounding skin.  Upon debridement the nail underlying nailbed intact without any hyperpigmentation.  No significant pain today.  No edema, erythema or signs of infection. No pain with calf compression, swelling, warmth, erythema  Assessment: 45 year old female with onychodystrophy  Plan: -All treatment options discussed with the patient including all alternatives, risks, complications.  -Debrided the nail with any complications or bleeding.  Recommended urea nail gel.  Trula Slade DPM

## 2021-06-30 ENCOUNTER — Ambulatory Visit: Payer: 59

## 2021-06-30 ENCOUNTER — Encounter: Payer: Self-pay | Admitting: *Deleted

## 2021-07-18 ENCOUNTER — Ambulatory Visit (INDEPENDENT_AMBULATORY_CARE_PROVIDER_SITE_OTHER): Payer: 59 | Admitting: *Deleted

## 2021-07-18 ENCOUNTER — Other Ambulatory Visit: Payer: Self-pay

## 2021-07-18 ENCOUNTER — Telehealth: Payer: Self-pay | Admitting: *Deleted

## 2021-07-18 VITALS — BP 165/99 | Temp 97.7°F

## 2021-07-18 DIAGNOSIS — M0579 Rheumatoid arthritis with rheumatoid factor of multiple sites without organ or systems involvement: Secondary | ICD-10-CM

## 2021-07-18 DIAGNOSIS — Z79899 Other long term (current) drug therapy: Secondary | ICD-10-CM

## 2021-07-18 MED ORDER — CERTOLIZUMAB PEGOL 2 X 200 MG ~~LOC~~ KIT
400.0000 mg | PACK | Freq: Once | SUBCUTANEOUS | Status: AC
  Administered 2021-07-18: 400 mg via SUBCUTANEOUS

## 2021-07-18 NOTE — Progress Notes (Signed)
Subjective:   Patient presents to clinic today to receive monthly dose of Cimzia.  Patient running a fever or have signs/symptoms of infection? No  Patient currently on antibiotics for the treatment of infection? No  Patient have any upcoming invasive procedures/surgeries? No  Objective: CMP     Component Value Date/Time   NA 139 06/01/2021 1539   NA 138 06/11/2018 1212   K 4.9 06/01/2021 1539   CL 104 06/01/2021 1539   CO2 27 06/01/2021 1539   GLUCOSE 87 06/01/2021 1539   BUN 16 06/01/2021 1539   BUN 9 06/11/2018 1212   CREATININE 0.89 06/01/2021 1539   CALCIUM 9.3 06/01/2021 1539   PROT 8.3 (H) 06/01/2021 1539   PROT 8.0 06/11/2018 1212   ALBUMIN 3.8 03/16/2021 1417   ALBUMIN 3.8 06/11/2018 1212   AST 16 06/01/2021 1539   ALT 7 06/01/2021 1539   ALKPHOS 67 03/16/2021 1417   BILITOT 0.4 06/01/2021 1539   BILITOT 0.3 06/11/2018 1212   GFRNONAA >60 03/30/2021 0310   GFRNONAA 72 07/02/2020 1106   GFRAA 84 07/02/2020 1106    CBC    Component Value Date/Time   WBC 9.8 03/30/2021 0310   RBC 3.09 (L) 03/30/2021 0310   HGB 9.3 (L) 03/30/2021 0310   HGB 11.9 06/11/2018 1212   HCT 29.1 (L) 03/30/2021 0310   HCT 36.6 06/11/2018 1212   PLT 243 03/30/2021 0310   PLT 310 06/11/2018 1212   MCV 94.2 03/30/2021 0310   MCV 93 06/11/2018 1212   MCH 30.1 03/30/2021 0310   MCHC 32.0 03/30/2021 0310   RDW 13.9 03/30/2021 0310   RDW 14.4 06/11/2018 1212   LYMPHSABS 2.5 02/28/2021 1614   LYMPHSABS 2.3 06/11/2018 1212   MONOABS 0.5 02/28/2021 1614   EOSABS 0.1 02/28/2021 1614   EOSABS 0.2 06/11/2018 1212   BASOSABS 0.1 02/28/2021 1614   BASOSABS 0.0 06/11/2018 1212    Baseline Immunosuppressant Therapy Labs TB GOLD Quantiferon TB Gold Latest Ref Rng & Units 03/23/2021  Quantiferon TB Gold Plus NEGATIVE NEGATIVE   Hepatitis Panel Hepatitis Latest Ref Rng & Units 06/13/2019  Hep B Surface Ag NON-REACTI NON-REACTIVE  Hep B IgM NON-REACTI NON-REACTIVE  Hep C Ab NON-REACTI  NON-REACTIVE  Hep C Ab NON-REACTI NON-REACTIVE  Hep A IgM NON-REACTI NON-REACTIVE   HIV Lab Results  Component Value Date   HIV NON-REACTIVE 02/18/2020   Immunoglobulins Immunoglobulin Electrophoresis Latest Ref Rng & Units 02/18/2020  IgA  47 - 310 mg/dL 651(H)  IgG 600 - 1,640 mg/dL 2,218(H)  IgM 50 - 300 mg/dL 78   SPEP Serum Protein Electrophoresis Latest Ref Rng & Units 06/01/2021  Total Protein 6.1 - 8.1 g/dL 8.3(H)  Albumin 3.8 - 4.8 g/dL -  Alpha-1 0.2 - 0.3 g/dL -  Alpha-2 0.5 - 0.9 g/dL -  Beta Globulin 0.4 - 0.6 g/dL -  Beta 2 0.2 - 0.5 g/dL -  Gamma Globulin 0.8 - 1.7 g/dL -   G6PD No results found for: G6PDH TPMT No results found for: TPMT   Chest x-ray: 12/12/2013 No acute abnormalities.  Assessment/Plan:   Administrations This Visit     Certolizumab Pegol KIT 400 mg     Admin Date 07/18/2021 Action Given Dose 400 mg Route Subcutaneous Administered By Carole Binning, LPN             Patient tolerated injection well.   Appointment for next injection scheduled for 08/15/2021.  Patient due for labs today and were drawn in office.  Patient is to call and reschedule appointment if running a fever with signs/symptoms of infection, on antibiotics for active infection or has an upcoming invasive procedure.  All questions encouraged and answered.  Instructed patient to call with any further questions or concerns.

## 2021-07-18 NOTE — Telephone Encounter (Signed)
Patient in the office for Cimzia injection. Patient inquired about scheduling her visco and if the authorization was still good.

## 2021-07-19 LAB — CBC WITH DIFFERENTIAL/PLATELET
Absolute Monocytes: 554 cells/uL (ref 200–950)
Basophils Absolute: 20 cells/uL (ref 0–200)
Basophils Relative: 0.4 %
Eosinophils Absolute: 123 cells/uL (ref 15–500)
Eosinophils Relative: 2.5 %
HCT: 37.8 % (ref 35.0–45.0)
Hemoglobin: 12.4 g/dL (ref 11.7–15.5)
Lymphs Abs: 2661 cells/uL (ref 850–3900)
MCH: 29.8 pg (ref 27.0–33.0)
MCHC: 32.8 g/dL (ref 32.0–36.0)
MCV: 90.9 fL (ref 80.0–100.0)
MPV: 11.6 fL (ref 7.5–12.5)
Monocytes Relative: 11.3 %
Neutro Abs: 1544 cells/uL (ref 1500–7800)
Neutrophils Relative %: 31.5 %
Platelets: 237 10*3/uL (ref 140–400)
RBC: 4.16 10*6/uL (ref 3.80–5.10)
RDW: 12.3 % (ref 11.0–15.0)
Total Lymphocyte: 54.3 %
WBC: 4.9 10*3/uL (ref 3.8–10.8)

## 2021-07-19 LAB — COMPLETE METABOLIC PANEL WITH GFR
AG Ratio: 1 (calc) (ref 1.0–2.5)
ALT: 14 U/L (ref 6–29)
AST: 20 U/L (ref 10–35)
Albumin: 3.9 g/dL (ref 3.6–5.1)
Alkaline phosphatase (APISO): 87 U/L (ref 31–125)
BUN: 16 mg/dL (ref 7–25)
CO2: 25 mmol/L (ref 20–32)
Calcium: 9.2 mg/dL (ref 8.6–10.2)
Chloride: 103 mmol/L (ref 98–110)
Creat: 0.8 mg/dL (ref 0.50–0.99)
Globulin: 4.1 g/dL (calc) — ABNORMAL HIGH (ref 1.9–3.7)
Glucose, Bld: 106 mg/dL — ABNORMAL HIGH (ref 65–99)
Potassium: 4.5 mmol/L (ref 3.5–5.3)
Sodium: 138 mmol/L (ref 135–146)
Total Bilirubin: 0.4 mg/dL (ref 0.2–1.2)
Total Protein: 8 g/dL (ref 6.1–8.1)
eGFR: 92 mL/min/{1.73_m2} (ref >=60)

## 2021-07-19 NOTE — Progress Notes (Signed)
CBC is normal.  Glucose is mildly elevated, probably not a fasting sample.

## 2021-07-19 NOTE — Telephone Encounter (Signed)
I spoke with patient in regards to her left knee Visco injections. Orthovisc has been approved from 04/08/2021- 04/09/2022. Insurance to cover 85% until OOPis met, then will cover 100%. No copay required. Patient understood, and scheduled appointments.

## 2021-07-22 DIAGNOSIS — Z96651 Presence of right artificial knee joint: Secondary | ICD-10-CM | POA: Insufficient documentation

## 2021-08-08 ENCOUNTER — Ambulatory Visit (INDEPENDENT_AMBULATORY_CARE_PROVIDER_SITE_OTHER): Payer: 59 | Admitting: Physician Assistant

## 2021-08-08 ENCOUNTER — Other Ambulatory Visit: Payer: Self-pay

## 2021-08-08 DIAGNOSIS — M1712 Unilateral primary osteoarthritis, left knee: Secondary | ICD-10-CM

## 2021-08-08 MED ORDER — HYALURONAN 30 MG/2ML IX SOSY
30.0000 mg | PREFILLED_SYRINGE | INTRA_ARTICULAR | Status: AC | PRN
  Administered 2021-08-08: 30 mg via INTRA_ARTICULAR

## 2021-08-08 MED ORDER — LIDOCAINE HCL 1 % IJ SOLN
1.5000 mL | INTRAMUSCULAR | Status: AC | PRN
  Administered 2021-08-08: 1.5 mL

## 2021-08-08 NOTE — Progress Notes (Signed)
   Procedure Note  Patient: Jennifer [name]             Date of Birth: [birth year]           MRN: [id number]             Visit Date: 08/08/2021  Procedures: Visit Diagnoses:  1. Primary osteoarthritis of left knee    Orthovisc #1 Left knee joint injection Large Joint Inj: L knee on 08/08/2021 12:08 PM Indications: pain Details: 25 G 1.5 in needle, medial approach  Arthrogram: No  Medications: 1.5 mL lidocaine 1 %; 30 mg Hyaluronan 30 MG/2ML Aspirate: 0 mL Outcome: tolerated well, no immediate complications Procedure, treatment alternatives, risks and benefits explained, specific risks discussed. Consent was given by the patient. Immediately prior to procedure a time out was called to verify the correct patient, procedure, equipment, support staff and site/side marked as required. Patient was prepped and draped in the usual sterile fashion.   Patient tolerated the procedure well.  Aftercare was discussed.  Hazel Sams, PA-C

## 2021-08-15 ENCOUNTER — Other Ambulatory Visit: Payer: Self-pay

## 2021-08-15 ENCOUNTER — Ambulatory Visit (INDEPENDENT_AMBULATORY_CARE_PROVIDER_SITE_OTHER): Payer: 59 | Admitting: *Deleted

## 2021-08-15 ENCOUNTER — Ambulatory Visit (INDEPENDENT_AMBULATORY_CARE_PROVIDER_SITE_OTHER): Payer: 59 | Admitting: Physician Assistant

## 2021-08-15 VITALS — BP 161/94 | HR 73

## 2021-08-15 DIAGNOSIS — M0579 Rheumatoid arthritis with rheumatoid factor of multiple sites without organ or systems involvement: Secondary | ICD-10-CM

## 2021-08-15 DIAGNOSIS — M1712 Unilateral primary osteoarthritis, left knee: Secondary | ICD-10-CM

## 2021-08-15 MED ORDER — HYALURONAN 30 MG/2ML IX SOSY
30.0000 mg | PREFILLED_SYRINGE | INTRA_ARTICULAR | Status: AC | PRN
  Administered 2021-08-15: 30 mg via INTRA_ARTICULAR

## 2021-08-15 MED ORDER — CERTOLIZUMAB PEGOL 2 X 200 MG ~~LOC~~ KIT
400.0000 mg | PACK | Freq: Once | SUBCUTANEOUS | Status: AC
  Administered 2021-08-15: 400 mg via SUBCUTANEOUS

## 2021-08-15 MED ORDER — LIDOCAINE HCL 1 % IJ SOLN
1.5000 mL | INTRAMUSCULAR | Status: AC | PRN
  Administered 2021-08-15: 1.5 mL via INTRA_ARTICULAR

## 2021-08-15 NOTE — Progress Notes (Signed)
Subjective:   Patient presents to clinic today to receive monthly dose of Cimzia.  Patient running a fever or have signs/symptoms of infection? No  Patient currently on antibiotics for the treatment of infection? No  Patient have any upcoming invasive procedures/surgeries? No  Objective: CMP     Component Value Date/Time   NA 138 07/18/2021 1411   NA 138 06/11/2018 1212   K 4.5 07/18/2021 1411   CL 103 07/18/2021 1411   CO2 25 07/18/2021 1411   GLUCOSE 106 (H) 07/18/2021 1411   BUN 16 07/18/2021 1411   BUN 9 06/11/2018 1212   CREATININE 0.80 07/18/2021 1411   CALCIUM 9.2 07/18/2021 1411   PROT 8.0 07/18/2021 1411   PROT 8.0 06/11/2018 1212   ALBUMIN 3.8 03/16/2021 1417   ALBUMIN 3.8 06/11/2018 1212   AST 20 07/18/2021 1411   ALT 14 07/18/2021 1411   ALKPHOS 67 03/16/2021 1417   BILITOT 0.4 07/18/2021 1411   BILITOT 0.3 06/11/2018 1212   GFRNONAA >60 03/30/2021 0310   GFRNONAA 72 07/02/2020 1106   GFRAA 84 07/02/2020 1106    CBC    Component Value Date/Time   WBC 4.9 07/18/2021 1411   RBC 4.16 07/18/2021 1411   HGB 12.4 07/18/2021 1411   HGB 11.9 06/11/2018 1212   HCT 37.8 07/18/2021 1411   HCT 36.6 06/11/2018 1212   PLT 237 07/18/2021 1411   PLT 310 06/11/2018 1212   MCV 90.9 07/18/2021 1411   MCV 93 06/11/2018 1212   MCH 29.8 07/18/2021 1411   MCHC 32.8 07/18/2021 1411   RDW 12.3 07/18/2021 1411   RDW 14.4 06/11/2018 1212   LYMPHSABS 2,661 07/18/2021 1411   LYMPHSABS 2.3 06/11/2018 1212   MONOABS 0.5 02/28/2021 1614   EOSABS 123 07/18/2021 1411   EOSABS 0.2 06/11/2018 1212   BASOSABS 20 07/18/2021 1411   BASOSABS 0.0 06/11/2018 1212    Baseline Immunosuppressant Therapy Labs TB GOLD Quantiferon TB Gold Latest Ref Rng & Units 03/23/2021  Quantiferon TB Gold Plus NEGATIVE NEGATIVE   Hepatitis Panel Hepatitis Latest Ref Rng & Units 06/13/2019  Hep B Surface Ag NON-REACTI NON-REACTIVE  Hep B IgM NON-REACTI NON-REACTIVE  Hep C Ab NON-REACTI  NON-REACTIVE  Hep C Ab NON-REACTI NON-REACTIVE  Hep A IgM NON-REACTI NON-REACTIVE   HIV Lab Results  Component Value Date   HIV NON-REACTIVE 02/18/2020   Immunoglobulins Immunoglobulin Electrophoresis Latest Ref Rng & Units 02/18/2020  IgA  47 - 310 mg/dL 651(H)  IgG 600 - 1,640 mg/dL 2,218(H)  IgM 50 - 300 mg/dL 78   SPEP Serum Protein Electrophoresis Latest Ref Rng & Units 07/18/2021  Total Protein 6.1 - 8.1 g/dL 8.0  Albumin 3.8 - 4.8 g/dL -  Alpha-1 0.2 - 0.3 g/dL -  Alpha-2 0.5 - 0.9 g/dL -  Beta Globulin 0.4 - 0.6 g/dL -  Beta 2 0.2 - 0.5 g/dL -  Gamma Globulin 0.8 - 1.7 g/dL -   G6PD No results found for: G6PDH TPMT No results found for: TPMT   Chest x-ray: 12/12/2013 No acute abnormalities  Assessment/Plan:   Administrations This Visit     Certolizumab Pegol KIT 400 mg     Admin Date 08/15/2021 Action Given Dose 400 mg Route Subcutaneous Administered By Carole Binning, LPN             Patient tolerated injection well.   Appointment for next injection scheduled for 09/13/2021.  Patient due for labs in January 2023.  Patient is to call and  reschedule appointment if running a fever with signs/symptoms of infection, on antibiotics for active infection or has an upcoming invasive procedure.  All questions encouraged and answered.  Instructed patient to call with any further questions or concerns.

## 2021-08-15 NOTE — Progress Notes (Signed)
   Procedure Note  Patient: Jennifer [name]             Date of Birth: 12/28/1975           MRN: [id number]             Visit Date: 08/15/2021  Procedures: Visit Diagnoses:  1. Primary osteoarthritis of left knee    Orthovisc #2 left knee, B/B Large Joint Inj: L knee on 08/15/2021 11:57 AM Indications: pain Details: 25 G 1.5 in needle, medial approach  Arthrogram: No  Medications: 1.5 mL lidocaine 1 %; 30 mg Hyaluronan 30 MG/2ML Aspirate: 0 mL Outcome: tolerated well, no immediate complications Procedure, treatment alternatives, risks and benefits explained, specific risks discussed. Consent was given by the patient. Immediately prior to procedure a time out was called to verify the correct patient, procedure, equipment, support staff and site/side marked as required. Patient was prepped and draped in the usual sterile fashion.     Patient tolerated the procedure well.  Aftercare was discussed.  Hazel Sams, PA-C

## 2021-08-17 NOTE — Telephone Encounter (Signed)
I called the patient to discuss the symptoms she is experiencing.   She is not having a fever, chills, warmth, and increased swelling.  She is mainly experiencing instability in the left knee and has noticed a buckling sensation.  Discussed the importance of resting, ice, compression, and elevation. She can use a compression wrap/brace for the next several days.  She was advised to notify us if her symptoms persist or worsen.  I offered to see her tomorrow or Friday.  She may need to try a cortisone injection if her symptoms persist or worsen.

## 2021-08-22 ENCOUNTER — Ambulatory Visit (INDEPENDENT_AMBULATORY_CARE_PROVIDER_SITE_OTHER): Payer: 59 | Admitting: Physician Assistant

## 2021-08-22 ENCOUNTER — Other Ambulatory Visit: Payer: Self-pay

## 2021-08-22 DIAGNOSIS — M1712 Unilateral primary osteoarthritis, left knee: Secondary | ICD-10-CM

## 2021-08-22 MED ORDER — HYALURONAN 30 MG/2ML IX SOSY
30.0000 mg | PREFILLED_SYRINGE | INTRA_ARTICULAR | Status: AC | PRN
  Administered 2021-08-22: 30 mg via INTRA_ARTICULAR

## 2021-08-22 MED ORDER — LIDOCAINE HCL 1 % IJ SOLN
1.5000 mL | INTRAMUSCULAR | Status: AC | PRN
  Administered 2021-08-22: 1.5 mL via INTRA_ARTICULAR

## 2021-08-22 NOTE — Progress Notes (Signed)
   Procedure Note  Patient: Jennifer [name]             Date of Birth: [birth year]           MRN: [id number]             Visit Date: 08/22/2021  Procedures: Visit Diagnoses:  1. Primary osteoarthritis of left knee    Orthovisc #3 left knee, B/B Large Joint Inj: L knee on 08/22/2021 1:54 PM Indications: pain Details: 25 G 1.5 in needle, medial approach  Arthrogram: No  Medications: 1.5 mL lidocaine 1 %; 30 mg Hyaluronan 30 MG/2ML Aspirate: 0 mL Outcome: tolerated well, no immediate complications Procedure, treatment alternatives, risks and benefits explained, specific risks discussed. Consent was given by the patient. Immediately prior to procedure a time out was called to verify the correct patient, procedure, equipment, support staff and site/side marked as required. Patient was prepped and draped in the usual sterile fashion.     Patient tolerated the procedure well.  Aftercare was discussed.  Hazel Sams, PA-C

## 2021-09-12 ENCOUNTER — Other Ambulatory Visit: Payer: Self-pay

## 2021-09-12 ENCOUNTER — Ambulatory Visit
Admission: RE | Admit: 2021-09-12 | Discharge: 2021-09-12 | Disposition: A | Discharge status: Home / Self Care | Payer: 59 | Source: Ambulatory Visit | Attending: Internal Medicine | Admitting: Internal Medicine

## 2021-09-12 VITALS — BP 149/101 | HR 85 | Temp 98.1°F | Resp 18

## 2021-09-12 DIAGNOSIS — J069 Acute upper respiratory infection, unspecified: Secondary | ICD-10-CM | POA: Insufficient documentation

## 2021-09-12 DIAGNOSIS — J039 Acute tonsillitis, unspecified: Secondary | ICD-10-CM | POA: Insufficient documentation

## 2021-09-12 LAB — POCT RAPID STREP A (OFFICE): Rapid Strep A Screen: NEGATIVE

## 2021-09-12 MED ORDER — BENZONATATE 100 MG PO CAPS: 100.0000 mg | ORAL_CAPSULE | Freq: Three times a day (TID) | ORAL | 0 refills | Status: DC | PRN

## 2021-09-12 MED ORDER — PREDNISONE 20 MG PO TABS: 40.0000 mg | ORAL_TABLET | Freq: Every day | ORAL | 0 refills | Status: AC

## 2021-09-12 MED ORDER — OSELTAMIVIR PHOSPHATE 75 MG PO CAPS: 75.0000 mg | ORAL_CAPSULE | Freq: Two times a day (BID) | ORAL | 0 refills | Status: DC

## 2021-09-12 MED ORDER — ALBUTEROL SULFATE HFA 108 (90 BASE) MCG/ACT IN AERS: 1.0000 | INHALATION_SPRAY | Freq: Four times a day (QID) | RESPIRATORY_TRACT | 0 refills | Status: DC | PRN

## 2021-09-12 NOTE — ED Triage Notes (Signed)
Pt c/o cough, body aches, nasal congestion with drainage, headache,   Denies sore throat, earache, nausea, vomiting, diarrhea, constipation  Onset ~ Saturday

## 2021-09-12 NOTE — Discharge Instructions (Addendum)
It appears that you have a viral upper respiratory infection that should resolve in the next few days with symptomatic treatment.  Prednisone steroid is also prescribed due to acute tonsillitis on exam.  Cough medication has also been prescribed for you take as needed.  Your rapid strep was negative.  Throat culture, COVID-19, flu swabs are pending.  We will call if they are positive.  You have prescribed Tamiflu due to suspicion of flu as well.

## 2021-09-12 NOTE — ED Provider Notes (Signed)
[facility]    CSN: [id number] Arrival date & time: 09/12/21  1043      History   Chief Complaint Chief Complaint  Patient presents with   Cough    HPI Jennifer [name] is a 46 y.o. female.   Patient presents with nonproductive cough, nasal congestion, mild sore throat, body aches, body aches, headache that started approximately 2 days ago. Patient denies any known fevers.  Patient does have a known sick contact at work.  Patient has taken over-the-counter cold and flu medication with minimal improvement in symptoms.  Denies chest pain, shortness of breath, ear pain, nausea, vomiting, diarrhea, abdominal pain.   Cough  Past Medical History:  Diagnosis Date   Acute otitis media 08/28/2012   improved  change to liquid medication    Breast abscess of female    Recurrent   Family history of adverse reaction to anesthesia    father hard time waking once (12/20/2016)   Fibroids    w/bleeding   GERD (gastroesophageal reflux disease)    History of blood transfusion    after surgery   Hypertension    Migraines    Dr. Orie Rout; "I have a few/month" (12/20/2016)   Osteoarthritis    Pill esophagitis 08/28/2012   amoxicillin  by hx  disc plan nl voice except hoarse not drooling  close fu  if not getting better with plan stop aleve  liquid ibu onlyf necessary    Recurrent periodic urticaria    Rheumatoid arthritis ([place])    "55% of my body" (12/20/2016)   Rheumatoid arthritis ([place])    Sleep apnea    wears cpap    Patient Active Problem List   Diagnosis Date Noted   Primary osteoarthritis of right knee 03/28/2021   OA (osteoarthritis) of knee 04/02/2020   Pain in right knee 01/13/2020   Chronic pain of right knee 01/21/2019   S/P right knee arthroscopy 07/23/2018   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   Primary osteoarthritis of both hands 02/04/2018   Sleep apnea, obstructive 09/28/2017   Primary osteoarthritis of both knees 05/22/2017   Incisional hernia  12/18/2016   Leiomyoma of uterus 09/06/2016   Fibroid, uterine    Essential hypertension 05/25/2015   Recurrent headache 05/25/2015   Head lump 07/31/2014   Edema 12/29/2013   Baker's cyst, ruptured 12/25/2013   Cough, persistent 12/12/2013   Left leg swelling 12/12/2013   Cough 12/12/2013   High risk medication use 12/12/2013   Recurrent periodic urticaria    Rheumatoid arthritis ([place]) 08/28/2012   CHRONIC LARYNGITIS 11/03/2010   ALLERGIC RHINITIS 11/03/2010   PARESTHESIA 07/28/2010   DYSMENORRHEA 10/15/2007   FIBROIDS, UTERUS 07/24/2007   OBESITY 07/24/2007   SLEEPLESSNESS 07/24/2007   HEADACHE 07/24/2007    Past Surgical History:  Procedure Laterality Date   BREAST SURGERY     abcess under left breast    CYST EXCISION Left 2014   "elbow"   HERNIA REPAIR     INCISION AND DRAINAGE BREAST ABSCESS Left 2003   INCISIONAL HERNIA REPAIR N/A 12/18/2016   Procedure: REPAIR INCISIONAL HERNIA WITH MESH;  Surgeon: Georganna Skeans, MD;  Location: [facility];  Service: General;  Laterality: N/A;   INSERTION OF MESH N/A 12/18/2016   Procedure: INSERTION OF MESH;  Surgeon: Georganna Skeans, MD;  Location: [facility];  Service: General;  Laterality: N/A;   KNEE ARTHROSCOPY WITH MENISCAL REPAIR Right 06/17/2018   Procedure: RIGHT KNEE ARTHROSCOPY WITH MENISCAL REPAIR;  Surgeon: Vickey Huger, MD;  Location: [facility];  Service: Orthopedics;  Laterality: Right;   MYOMECTOMY N/A 09/06/2016   Procedure: Lendell Caprice;  Surgeon: Governor Specking, MD;  Location: [facility];  Service: Gynecology;  Laterality: N/A;   TOTAL KNEE ARTHROPLASTY Right 03/28/2021   Procedure: TOTAL KNEE ARTHROPLASTY;  Surgeon: Gaynelle Arabian, MD;  Location: [facility];  Service: Orthopedics;  Laterality: Right;  30mn   UTERINE FIBROID SURGERY     35 fibroids    OB History     Gravida  1   Para  1   Term      Preterm      AB      Living         SAB      IAB      Ectopic      Multiple      Live Births                Home Medications    Prior to Admission medications   Medication Sig Start Date End Date Taking? Authorizing Provider  benzonatate (TESSALON) 100 MG capsule Take 1 capsule (100 mg total) by mouth every 8 (eight) hours as needed for cough. 09/12/21  Yes , HMichele Rockers FNP  oseltamivir (TAMIFLU) 75 MG capsule Take 1 capsule (75 mg total) by mouth every 12 (twelve) hours. 09/12/21  Yes , HHildred AlaminE, FNP  predniSONE (DELTASONE) 20 MG tablet Take 2 tablets (40 mg total) by mouth daily for 5 days. 09/12/21 09/17/21 Yes , HHildred AlaminE, FNP  amLODipine (NORVASC) 5 MG tablet TAKE 1 TABLET BY MOUTH EVERY DAY 02/24/19   Panosh, WStandley Brooking MD  amLODipine (NORVASC) 5 MG tablet Take 1 tablet by mouth daily. 04/21/21   [provider]  aspirin 325 MG tablet Take 1 tablet (325 mg total) by mouth 2 (two) times daily. 03/29/21   CJonnie Kind PA-C  ASPIRIN 81 PO aspirin    [provider]  cetirizine (ZYRTEC) 10 MG tablet Take 10 mg by mouth daily as needed for allergies.    [provider]  gabapentin (NEURONTIN) 300 MG capsule Take a 300 mg capsule three times a day for two weeks following surgery.Then take a 300 mg capsule two times a day for two weeks. Then take a 300 mg capsule once a day for two weeks. Then discontinue. 03/29/21   CFenton FoyD, PA-C  HYDROcodone-acetaminophen (NORCO/VICODIN) 5-325 MG tablet hydrocodone 5 mg-acetaminophen 325 mg tablet  TAKE 1 TABLET BY MOUTH EVERY 4 TO 6 HOURS AS NEEDED FOR PAIN    [provider]  Levonorgestrel (KYLEENA) 19.5 MG IUD 19.5 mg by Intrauterine route once.    [provider]  Melatonin 10 MG CAPS Take 20 mg by mouth at bedtime.    [provider]  methocarbamol (ROBAXIN) 500 MG tablet Take 1 tablet (500 mg total) by mouth every 6 (six) hours as needed for muscle spasms. 03/29/21   CFenton FoyD, PA-C  NON FORMULARY Pt uses cpap nightly    [provider]  oxyCODONE (OXY IR/ROXICODONE)  5 MG immediate release tablet Take 1-2 tablets (5-10 mg total) by mouth every 6 (six) hours as needed for severe pain. 03/29/21   CFenton FoyD, PA-C  Phentermine HCl (LOMAIRA) 8 MG TABS [place] 8 mg tablet  TAKE 1/2 TO 1 TABLET BY MOUTH AT 10 AM 05/26/21   [provider]  prednisoLONE acetate (PRED FORTE) 1 % ophthalmic suspension Place 1 drop into the left eye in  the morning, at noon, and at bedtime. 4 x daily per pt on 03/16/21 02/25/21   [provider]  traMADol (ULTRAM) 50 MG tablet Take 1-2 tablets (50-100 mg total) by mouth every 6 (six) hours as needed for moderate pain. 03/29/21   Fenton Foy D, PA-C  triamcinolone (NASACORT) 55 MCG/ACT AERO nasal inhaler Nasal Allergy 55 mcg spray aerosol  INHALE 2 SPRAYS IN EACH NOSTRIL AT NIGHT    [provider]  CIMZIA 2 X 200 MG KIT INJECT 400 MG UNDER THE SKIN EVERY 28 (TWENTY-EIGHT) DAYS 07/08/18 12/04/20  Bo Merino, MD  eletriptan (RELPAX) 40 MG tablet Take 1 tablet (40 mg total) by mouth as needed for migraine or headache. May repeat in 2 hours if headache persists or recurs. 10/02/19 11/29/20  Panosh, Standley Brooking, MD    Family History Family History  Problem Relation Age of Onset   Hypertension Mother    Rheum arthritis Mother    Hypertension Father    Sleep apnea Father     Social History Social History   Tobacco Use   Smoking status: Never   Smokeless tobacco: Never  Vaping Use   Vaping Use: Never used  Substance Use Topics   Alcohol use: Yes    Comment: social   Drug use: Never     Allergies   Lisinopril and Lisinopril-hydrochlorothiazide   Review of Systems Review of Systems Per HPI  Physical Exam Triage Vital Signs ED Triage Vitals [09/12/21 1211]  Enc Vitals Group     BP (!) 149/101     Pulse Rate 85     Resp 18     Temp 98.1 F (36.7 C)     Temp Source Oral     SpO2 98 %     Weight      Height      Head Circumference      Peak Flow      Pain Score 0     Pain Loc       Pain Edu?      Excl. in [place]?    No data found.  Updated Vital Signs BP (!) 149/101 (BP Location: Left Arm)    Pulse 85    Temp 98.1 F (36.7 C) (Oral)    Resp 18    SpO2 98%   Visual Acuity Right Eye Distance:   Left Eye Distance:   Bilateral Distance:    Right Eye Near:   Left Eye Near:    Bilateral Near:     Physical Exam Constitutional:      General: She is not in acute distress.    Appearance: Normal appearance. She is not toxic-appearing or diaphoretic.  HENT:     Head: Normocephalic and atraumatic.     Right Ear: Tympanic membrane and ear canal normal.     Left Ear: Tympanic membrane and ear canal normal.     Nose: Congestion present.     Mouth/Throat:     Mouth: Mucous membranes are moist.     Pharynx: No pharyngeal swelling, oropharyngeal exudate, posterior oropharyngeal erythema or uvula swelling.     Tonsils: No tonsillar exudate or tonsillar abscesses. 2+ on the right. 2+ on the left.  Eyes:     Extraocular Movements: Extraocular movements intact.     Conjunctiva/sclera: Conjunctivae normal.     Pupils: Pupils are equal, round, and reactive to light.  Cardiovascular:     Rate and Rhythm: Normal rate and regular rhythm.     Pulses:  Normal pulses.     Heart sounds: Normal heart sounds.  Pulmonary:     Effort: Pulmonary effort is normal. No respiratory distress.     Breath sounds: Normal breath sounds. No stridor. No wheezing, rhonchi or rales.  Abdominal:     General: Abdomen is flat. Bowel sounds are normal.     Palpations: Abdomen is soft.  Musculoskeletal:        General: Normal range of motion.     Cervical back: Normal range of motion.  Skin:    General: Skin is warm and dry.  Neurological:     General: No focal deficit present.     Mental Status: She is alert and oriented to person, place, and time. Mental status is at baseline.  Psychiatric:        Mood and Affect: Mood normal.        Behavior: Behavior normal.     UC Treatments / Results   Labs (all labs ordered are listed, but only abnormal results are displayed) Labs Reviewed  COVID-19, FLU A+B AND RSV  CULTURE, GROUP A STREP [facility])  POCT RAPID STREP A (OFFICE)    EKG   Radiology No results found.  Procedures Procedures (including critical care time)  Medications Ordered in UC Medications - No data to display  Initial Impression / Assessment and Plan / UC Course  I have reviewed the triage vital signs and the nursing notes.  Pertinent labs & imaging results that were available during my care of the patient were reviewed by me and considered in my medical decision making (see chart for details).     Patient presents with symptoms likely from a viral upper respiratory infection. Differential includes bacterial pneumonia, sinusitis, allergic rhinitis, COVID-19, flu. Do not suspect underlying cardiopulmonary process. Symptoms seem unlikely related to ACS, CHF or COPD exacerbations, pneumonia, pneumothorax. Patient is nontoxic appearing and not in need of emergent medical intervention.  Rapid strep is negative.  Throat culture, COVID-19, flu test pending.  Will treat with prednisone x5 days due to acute tonsillitis on exam.  Rapid strep was negative.  Do not think antibiotic therapy is necessary as patient reports that her tonsils always become enlarged during any type of acute illness.  No signs of peritonsillar abscess on exam.  Recommended symptom control with over the counter medications.  Benzonatate to take as needed for cough.  Will opt to treat with Tamiflu due to high suspicion for the flu.  Return if symptoms fail to improve in 1-2 weeks or you develop shortness of breath, chest pain, severe headache. Patient states understanding and is agreeable.  Discharged with PCP followup.  Final Clinical Impressions(s) / UC Diagnoses   Final diagnoses:  Viral upper respiratory tract infection with cough  Acute tonsillitis, unspecified etiology     Discharge  Instructions      It appears that you have a viral upper respiratory infection that should resolve in the next few days with symptomatic treatment.  Prednisone steroid is also prescribed due to acute tonsillitis on exam.  Cough medication has also been prescribed for you take as needed.  Your rapid strep was negative.  Throat culture, COVID-19, flu swabs are pending.  We will call if they are positive.  You have prescribed Tamiflu due to suspicion of flu as well.    ED Prescriptions     Medication Sig Dispense Auth. Provider   benzonatate (TESSALON) 100 MG capsule Take 1 capsule (100 mg total) by mouth every 8 (eight)  hours as needed for cough. 21 capsule [place], Aldrich E, [place]   predniSONE (DELTASONE) 20 MG tablet Take 2 tablets (40 mg total) by mouth daily for 5 days. 10 tablet [place], Hildred Alamin E, [place]   albuterol (VENTOLIN HFA) 108 (90 Base) MCG/ACT inhaler  (Status: Discontinued) Inhale 1-2 puffs into the lungs every 6 (six) hours as needed for wheezing or shortness of breath. 1 each [place], Hildred Alamin E, [place]   oseltamivir (TAMIFLU) 75 MG capsule Take 1 capsule (75 mg total) by mouth every 12 (twelve) hours. 10 capsule Teodora Medici, [place]      PDMP not reviewed this encounter.   Teodora Medici, [place] 09/12/21 1243

## 2021-09-13 ENCOUNTER — Ambulatory Visit: Payer: 59

## 2021-09-13 LAB — COVID-19, FLU A+B AND RSV
Influenza A, NAA: NOT DETECTED
Influenza B, NAA: NOT DETECTED
RSV, NAA: NOT DETECTED
SARS-CoV-2, NAA: NOT DETECTED

## 2021-09-15 LAB — CULTURE, GROUP A STREP (THRC)

## 2021-10-13 ENCOUNTER — Ambulatory Visit (INDEPENDENT_AMBULATORY_CARE_PROVIDER_SITE_OTHER): Payer: 59 | Admitting: *Deleted

## 2021-10-13 ENCOUNTER — Other Ambulatory Visit: Payer: Self-pay

## 2021-10-13 VITALS — BP 148/95 | HR 73

## 2021-10-13 DIAGNOSIS — M0579 Rheumatoid arthritis with rheumatoid factor of multiple sites without organ or systems involvement: Secondary | ICD-10-CM | POA: Diagnosis not present

## 2021-10-13 DIAGNOSIS — Z79899 Other long term (current) drug therapy: Secondary | ICD-10-CM

## 2021-10-13 LAB — CBC WITH DIFFERENTIAL/PLATELET
Absolute Monocytes: 564 cells/uL (ref 200–950)
Basophils Absolute: 29 cells/uL (ref 0–200)
Basophils Relative: 0.6 %
Eosinophils Absolute: 152 cells/uL (ref 15–500)
Eosinophils Relative: 3.1 %
HCT: 38.8 % (ref 35.0–45.0)
Hemoglobin: 12.8 g/dL (ref 11.7–15.5)
Lymphs Abs: 2386 cells/uL (ref 850–3900)
MCH: 29.8 pg (ref 27.0–33.0)
MCHC: 33 g/dL (ref 32.0–36.0)
MCV: 90.4 fL (ref 80.0–100.0)
MPV: 10.9 fL (ref 7.5–12.5)
Monocytes Relative: 11.5 %
Neutro Abs: 1769 cells/uL (ref 1500–7800)
Neutrophils Relative %: 36.1 %
Platelets: 258 10*3/uL (ref 140–400)
RBC: 4.29 10*6/uL (ref 3.80–5.10)
RDW: 12.7 % (ref 11.0–15.0)
Total Lymphocyte: 48.7 %
WBC: 4.9 10*3/uL (ref 3.8–10.8)

## 2021-10-13 LAB — COMPLETE METABOLIC PANEL WITH GFR
AG Ratio: 1 (calc) (ref 1.0–2.5)
ALT: 13 U/L (ref 6–29)
AST: 15 U/L (ref 10–35)
Albumin: 4 g/dL (ref 3.6–5.1)
Alkaline phosphatase (APISO): 84 U/L (ref 31–125)
BUN: 21 mg/dL (ref 7–25)
CO2: 30 mmol/L (ref 20–32)
Calcium: 9.4 mg/dL (ref 8.6–10.2)
Chloride: 105 mmol/L (ref 98–110)
Creat: 0.99 mg/dL (ref 0.50–0.99)
Globulin: 3.9 g/dL (calc) — ABNORMAL HIGH (ref 1.9–3.7)
Glucose, Bld: 91 mg/dL (ref 65–99)
Potassium: 4.6 mmol/L (ref 3.5–5.3)
Sodium: 140 mmol/L (ref 135–146)
Total Bilirubin: 0.4 mg/dL (ref 0.2–1.2)
Total Protein: 7.9 g/dL (ref 6.1–8.1)
eGFR: 71 mL/min/{1.73_m2} (ref >=60)

## 2021-10-13 MED ORDER — CERTOLIZUMAB PEGOL 2 X 200 MG ~~LOC~~ KIT
400.0000 mg | PACK | Freq: Once | SUBCUTANEOUS | Status: AC
  Administered 2021-10-13: 400 mg via SUBCUTANEOUS

## 2021-10-13 NOTE — Progress Notes (Signed)
Subjective:   Patient presents to clinic today to receive monthly dose of Cimzia.  Patient running a fever or have signs/symptoms of infection? No  Patient currently on antibiotics for the treatment of infection? No  Patient have any upcoming invasive procedures/surgeries? No  Objective: CMP     Component Value Date/Time   NA 138 07/18/2021 1411   NA 138 06/11/2018 1212   K 4.5 07/18/2021 1411   CL 103 07/18/2021 1411   CO2 25 07/18/2021 1411   GLUCOSE 106 (H) 07/18/2021 1411   BUN 16 07/18/2021 1411   BUN 9 06/11/2018 1212   CREATININE 0.80 07/18/2021 1411   CALCIUM 9.2 07/18/2021 1411   PROT 8.0 07/18/2021 1411   PROT 8.0 06/11/2018 1212   ALBUMIN 3.8 03/16/2021 1417   ALBUMIN 3.8 06/11/2018 1212   AST 20 07/18/2021 1411   ALT 14 07/18/2021 1411   ALKPHOS 67 03/16/2021 1417   BILITOT 0.4 07/18/2021 1411   BILITOT 0.3 06/11/2018 1212   GFRNONAA >60 03/30/2021 0310   GFRNONAA 72 07/02/2020 1106   GFRAA 84 07/02/2020 1106    CBC    Component Value Date/Time   WBC 4.9 07/18/2021 1411   RBC 4.16 07/18/2021 1411   HGB 12.4 07/18/2021 1411   HGB 11.9 06/11/2018 1212   HCT 37.8 07/18/2021 1411   HCT 36.6 06/11/2018 1212   PLT 237 07/18/2021 1411   PLT 310 06/11/2018 1212   MCV 90.9 07/18/2021 1411   MCV 93 06/11/2018 1212   MCH 29.8 07/18/2021 1411   MCHC 32.8 07/18/2021 1411   RDW 12.3 07/18/2021 1411   RDW 14.4 06/11/2018 1212   LYMPHSABS 2,661 07/18/2021 1411   LYMPHSABS 2.3 06/11/2018 1212   MONOABS 0.5 02/28/2021 1614   EOSABS 123 07/18/2021 1411   EOSABS 0.2 06/11/2018 1212   BASOSABS 20 07/18/2021 1411   BASOSABS 0.0 06/11/2018 1212    Baseline Immunosuppressant Therapy Labs TB GOLD Quantiferon TB Gold Latest Ref Rng & Units 03/23/2021  Quantiferon TB Gold Plus NEGATIVE NEGATIVE   Hepatitis Panel Hepatitis Latest Ref Rng & Units 06/13/2019  Hep B Surface Ag NON-REACTI NON-REACTIVE  Hep B IgM NON-REACTI NON-REACTIVE  Hep C Ab NON-REACTI  NON-REACTIVE  Hep C Ab NON-REACTI NON-REACTIVE  Hep A IgM NON-REACTI NON-REACTIVE   HIV Lab Results  Component Value Date   HIV NON-REACTIVE 02/18/2020   Immunoglobulins Immunoglobulin Electrophoresis Latest Ref Rng & Units 02/18/2020  IgA  47 - 310 mg/dL 651(H)  IgG 600 - 1,640 mg/dL 2,218(H)  IgM 50 - 300 mg/dL 78   SPEP Serum Protein Electrophoresis Latest Ref Rng & Units 07/18/2021  Total Protein 6.1 - 8.1 g/dL 8.0  Albumin 3.8 - 4.8 g/dL -  Alpha-1 0.2 - 0.3 g/dL -  Alpha-2 0.5 - 0.9 g/dL -  Beta Globulin 0.4 - 0.6 g/dL -  Beta 2 0.2 - 0.5 g/dL -  Gamma Globulin 0.8 - 1.7 g/dL -   G6PD No results found for: G6PDH TPMT No results found for: TPMT   Chest x-ray: 12/12/2013 No acute abnormalities  Assessment/Plan:   Administrations This Visit     Certolizumab Pegol KIT 400 mg     Admin Date 10/13/2021 Action Given Dose 400 mg Route Subcutaneous Administered By Carole Binning, LPN             Patient tolerated injection well.   Appointment for next injection scheduled for 11/10/2021.  Patient due for labs in today and were drawn while in office.  Patient is to call and reschedule appointment if running a fever with signs/symptoms of infection, on antibiotics for active infection or has an upcoming invasive procedure.  All questions encouraged and answered.  Instructed patient to call with any further questions or concerns.

## 2021-10-17 ENCOUNTER — Ambulatory Visit (INDEPENDENT_AMBULATORY_CARE_PROVIDER_SITE_OTHER): Payer: 59 | Admitting: Podiatry

## 2021-10-17 ENCOUNTER — Other Ambulatory Visit: Payer: Self-pay

## 2021-10-17 DIAGNOSIS — L819 Disorder of pigmentation, unspecified: Secondary | ICD-10-CM

## 2021-10-17 DIAGNOSIS — L603 Nail dystrophy: Secondary | ICD-10-CM | POA: Diagnosis not present

## 2021-10-17 MED ORDER — NYSTATIN-TRIAMCINOLONE 100000-0.1 UNIT/GM-% EX OINT: 1.0000 "application " | TOPICAL_OINTMENT | Freq: Two times a day (BID) | CUTANEOUS | 0 refills | Status: DC

## 2021-10-17 NOTE — Patient Instructions (Signed)
I would recommend following up with your dermatologist for a full body check

## 2021-10-19 NOTE — Progress Notes (Signed)
Subjective: 46 year old female presents the office today for follow-up evaluation discoloration to her right second toe.  She states still dark in color but again has started this foot is not causing any pain.  No swelling redness or any drainage.  She has been using the compound ointment through [employer].  Objective: AAO x3, NAD DP/PT pulses palpable 2/4 bilaterally, CRT less than 3 seconds Right second digit toenail has grown back in and still dark in color.  Vertical splint present on the medial portion of the nail.  No edema, erythema or signs of infection.  No extension of any hyperpigmentation of the surrounding skin.  No signs of infection noted today.  No discomfort noted. No pain with calf compression, swelling, warmth, erythema  Assessment: 46 year old female with onychodystrophy  Plan: -All treatment options discussed with the patient including all alternatives, risks, complications.  -Previous biopsy was negative for any malignancy or melanoma.  Nail continues to be dark and.  We will switch to using Mycolog.  If symptoms worsen will biopsy. -Debrided the nail with any complications or bleeding.  Recommended urea nail gel.  Trula Slade DPM

## 2021-11-07 ENCOUNTER — Ambulatory Visit (INDEPENDENT_AMBULATORY_CARE_PROVIDER_SITE_OTHER): Payer: 59 | Admitting: Internal Medicine

## 2021-11-07 ENCOUNTER — Encounter: Payer: Self-pay | Admitting: Internal Medicine

## 2021-11-07 VITALS — BP 140/84 | HR 67 | Temp 98.8°F | Ht 64.0 in | Wt 241.6 lb

## 2021-11-07 DIAGNOSIS — G4733 Obstructive sleep apnea (adult) (pediatric): Secondary | ICD-10-CM | POA: Diagnosis not present

## 2021-11-07 DIAGNOSIS — I10 Essential (primary) hypertension: Secondary | ICD-10-CM

## 2021-11-07 DIAGNOSIS — G4700 Insomnia, unspecified: Secondary | ICD-10-CM

## 2021-11-07 DIAGNOSIS — Z79899 Other long term (current) drug therapy: Secondary | ICD-10-CM | POA: Diagnosis not present

## 2021-11-07 DIAGNOSIS — Z1211 Encounter for screening for malignant neoplasm of colon: Secondary | ICD-10-CM

## 2021-11-07 MED ORDER — RAMELTEON 8 MG PO TABS: 8.0000 mg | ORAL_TABLET | Freq: Every day | ORAL | 0 refills | Status: DC

## 2021-11-07 MED ORDER — AMLODIPINE BESYLATE 2.5 MG PO TABS: 2.5000 mg | ORAL_TABLET | Freq: Every day | ORAL | 1 refills | Status: DC

## 2021-11-07 NOTE — Patient Instructions (Addendum)
Good to see  you today .  Bp goal 130/80  average.   Increase to 7.5 mg per   day . ( And  dd 2.5 mg to the 5 mg per day)   Will do referral for routine colonoscopy.   And  reevaluate  for   sleep apnea.   Also  limit etoh and caffiene attend to sleep hygiene  Can try the rozerom med  also  not a controlled substance  stop the melatonin for now   Healthy weight loss will help all of above .   Consider talking with gyne and counselor about other factors .

## 2021-11-07 NOTE — Progress Notes (Signed)
Chief Complaint  Patient presents with   Referral    Referral colonoscopy and not sleeping,    HPI: Jennifer [name] 46 y.o. come in for a number of issues Last visit 6 2022 for    Bp 130/86 - 90 quite at goal but much better had lost a lot of weight is gaining some back no she needs to be attention  Needs referral for routine screening colonoscopy negative family history of colon cancer in first-degree relatives but father just had colonoscopy and had polyps.  No symptoms  Deep has become disruptive over the last couple months she is on CPAP but has not been monitored for quite a while.  She has more frequent awakenings getting up in the middle the night to use the bathroom like a little kid. Also has some difficulty falling asleep she is under more stress trying to make decisions about whether she should change her job being closer staying closer to her elderly parents to help take care of him her mother was very sick with COVID at some point.  She does have a counselor  GYN is due to go and does have an IUD asked about menopausal symptoms does get hot most of the time.    Arthritis seems to be stable on disease modifying medications.  Some caffeine last 4 PM occasional alcohol she noticed that she uses more recently with stress but not daily. ROS: See pertinent positives and negatives per HPI.  Past Medical History:  Diagnosis Date   Acute otitis media 08/28/2012   improved  change to liquid medication    Breast abscess of female    Recurrent   Family history of adverse reaction to anesthesia    father hard time waking once (12/20/2016)   Fibroids    w/bleeding   GERD (gastroesophageal reflux disease)    History of blood transfusion    after surgery   Hypertension    Migraines    Dr. Orie Rout; "I have a few/month" (12/20/2016)   Osteoarthritis    Pill esophagitis 08/28/2012   amoxicillin  by hx  disc plan nl voice except hoarse not drooling  close fu  if not  getting better with plan stop aleve  liquid ibu onlyf necessary    Recurrent periodic urticaria    Rheumatoid arthritis ([place])    "55% of my body" (12/20/2016)   Rheumatoid arthritis ([place])    Sleep apnea    wears cpap    Family History  Problem Relation Age of Onset   Hypertension Mother    Rheum arthritis Mother    Hypertension Father    Sleep apnea Father     Social History   Socioeconomic History   Marital status: Single    Spouse name: Not on file   Number of children: Not on file   Years of education: Not on file   Highest education level: Some college, no degree  Occupational History   Not on file  Tobacco Use   Smoking status: Never   Smokeless tobacco: Never  Vaping Use   Vaping Use: Never used  Substance and Sexual Activity   Alcohol use: Yes    Comment: social   Drug use: Never   Sexual activity: Not on file  Other Topics Concern   Not on file  Social History Narrative   Not on file   Social Determinants of Health   Financial Resource Strain: Medium Risk   Difficulty of Paying Living Expenses: Somewhat hard  Food Insecurity: Unknown   Worried About Charity fundraiser in the Last Year: Patient refused   Arboriculturist in the Last Year: Never true  Transportation Needs: No Transportation Needs   Lack of Transportation (Medical): No   Lack of Transportation (Non-Medical): No  Physical Activity: Insufficiently Active   Days of Exercise per Week: 3 days   Minutes of Exercise per Session: 10 min  Stress: Stress Concern Present   Feeling of Stress : Rather much  Social Connections: Moderately Integrated   Frequency of Communication with Friends and Family: More than three times a week   Frequency of Social Gatherings with Friends and Family: Once a week   Attends Religious Services: 1 to 4 times per year   Active Member of [employer] or Organizations: Yes   Attends Music therapist: More than 4 times per year   Marital Status: Never married     Outpatient Medications Prior to Visit  Medication Sig Dispense Refill   amLODipine (NORVASC) 5 MG tablet TAKE 1 TABLET BY MOUTH EVERY DAY 90 tablet 1   amLODipine (NORVASC) 5 MG tablet Take 1 tablet by mouth daily.     aspirin 325 MG tablet Take 1 tablet (325 mg total) by mouth 2 (two) times daily. 40 tablet 0   ASPIRIN 81 PO aspirin     benzonatate (TESSALON) 100 MG capsule Take 1 capsule (100 mg total) by mouth every 8 (eight) hours as needed for cough. 21 capsule 0   cetirizine (ZYRTEC) 10 MG tablet Take 10 mg by mouth daily as needed for allergies.     gabapentin (NEURONTIN) 300 MG capsule Take a 300 mg capsule three times a day for two weeks following surgery.Then take a 300 mg capsule two times a day for two weeks. Then take a 300 mg capsule once a day for two weeks. Then discontinue. 84 capsule 0   Levonorgestrel (KYLEENA) 19.5 MG IUD 19.5 mg by Intrauterine route once.     methocarbamol (ROBAXIN) 500 MG tablet Take 1 tablet (500 mg total) by mouth every 6 (six) hours as needed for muscle spasms. 40 tablet 0   NON FORMULARY Pt uses cpap nightly     nystatin-triamcinolone ointment (MYCOLOG) Apply 1 application topically 2 (two) times daily. 30 g 0   Phentermine HCl (LOMAIRA) 8 MG TABS Lomaira 8 mg tablet  TAKE 1/2 TO 1 TABLET BY MOUTH AT 10 AM     traMADol (ULTRAM) 50 MG tablet Take 1-2 tablets (50-100 mg total) by mouth every 6 (six) hours as needed for moderate pain. 40 tablet 0   triamcinolone (NASACORT) 55 MCG/ACT AERO nasal inhaler Nasal Allergy 55 mcg spray aerosol  INHALE 2 SPRAYS IN EACH NOSTRIL AT NIGHT     Melatonin 10 MG CAPS Take 20 mg by mouth at bedtime.     HYDROcodone-acetaminophen (NORCO/VICODIN) 5-325 MG tablet hydrocodone 5 mg-acetaminophen 325 mg tablet  TAKE 1 TABLET BY MOUTH EVERY 4 TO 6 HOURS AS NEEDED FOR PAIN     oseltamivir (TAMIFLU) 75 MG capsule Take 1 capsule (75 mg total) by mouth every 12 (twelve) hours. 10 capsule 0   oxyCODONE (OXY IR/ROXICODONE) 5  MG immediate release tablet Take 1-2 tablets (5-10 mg total) by mouth every 6 (six) hours as needed for severe pain. 42 tablet 0   prednisoLONE acetate (PRED FORTE) 1 % ophthalmic suspension Place 1 drop into the left eye in the morning, at noon, and at bedtime. 4 x daily per  pt on 03/16/21     No facility-administered medications prior to visit.     EXAM:  BP 140/84 (BP Location: Left Arm)    Pulse 67    Temp 98.8 F (37.1 C) (Oral)    Ht 5\' 4"  (1.626 m)    Wt 241 lb 9.6 oz (109.6 kg)    LMP  (LMP Unknown)    SpO2 98%    BMI 41.47 kg/m   Body mass index is 41.47 kg/m. Wt Readings from Last 3 Encounters:  11/07/21 241 lb 9.6 oz (109.6 kg)  03/28/21 229 lb 15 oz (104.3 kg)  02/28/21 237 lb 6.4 oz (107.7 kg)    GENERAL: vitals reviewed and listed above, alert, oriented, appears well hydrated and in no acute distress HEENT: atraumatic, conjunctiva  clear, no obvious abnormalities on inspection of external nose and ears OP masked NECK: no obvious masses on inspection palpation  LUNGS: clear to auscultation bilaterally, no wheezes, rales or rhonchi, good air movement CV: HRRR, no clubbing cyanosis or  peripheral edema nl cap refill  MS: moves all extremities without noticeable focal  abnormality PSYCH: pleasant and cooperative, no obvious depression or anxiety Lab Results  Component Value Date   WBC 4.9 10/13/2021   HGB 12.8 10/13/2021   HCT 38.8 10/13/2021   PLT 258 10/13/2021   GLUCOSE 91 10/13/2021   CHOL 175 07/02/2020   TRIG 52 07/02/2020   HDL 75 07/02/2020   LDLCALC 87 07/02/2020   ALT 13 10/13/2021   AST 15 10/13/2021   NA 140 10/13/2021   K 4.6 10/13/2021   CL 105 10/13/2021   CREATININE 0.99 10/13/2021   BUN 21 10/13/2021   CO2 30 10/13/2021   TSH 1.46 07/02/2020   INR 1.1 (H) 02/28/2021   HGBA1C 5.7 02/28/2021   BP Readings from Last 3 Encounters:  11/07/21 140/84  10/13/21 (!) 148/95  09/12/21 (!) 149/101    ASSESSMENT AND PLAN:  Discussed the  following assessment and plan:  Sleep apnea, obstructive - Plan: Ambulatory referral to Pulmonology  High risk medication use  Essential hypertension  Insomnia, unspecified type - Plan: Ambulatory referral to Pulmonology  Colon cancer screening - Plan: Ambulatory referral to Gastroenterology Multiple issues today refer for routine colon cancer screening insomnia may be missed mixed type but her CPAP and sleep apnea should be updated addressed in case there are subpar interventions. Discussed stress has good insight.  Has a remote history of using a sleep aid in the past related to melatonin we will try this time and stop her over-the-counter melatonin expectant management.  Blood pressure better but not at control needs to continue get back to losing weight we will try 7.5 of amlodipine to see if that gets better control.  Fu bp and sleep med in about a month   Referral for sleep apnea  update on cpap -Patient advised to return or notify health care team  if  new concerns arise.  Patient Instructions  Good to see  you today .  Bp goal 130/80  average.   Increase to 7.5 mg per   day . ( And  dd 2.5 mg to the 5 mg per day)   Will do referral for routine colonoscopy.   And  reevaluate  for   sleep apnea.   Also  limit etoh and caffiene attend to sleep hygiene  Can try the rozerom med  also  not a controlled substance  stop the melatonin for now   Healthy  weight loss will help all of above .   Consider talking with gyne and counselor about other factors .     Standley Brooking. Dondrea Clendenin M.D.

## 2021-11-10 ENCOUNTER — Ambulatory Visit (INDEPENDENT_AMBULATORY_CARE_PROVIDER_SITE_OTHER): Payer: 59 | Admitting: *Deleted

## 2021-11-10 ENCOUNTER — Other Ambulatory Visit: Payer: Self-pay

## 2021-11-10 VITALS — BP 146/87 | HR 69

## 2021-11-10 DIAGNOSIS — M0579 Rheumatoid arthritis with rheumatoid factor of multiple sites without organ or systems involvement: Secondary | ICD-10-CM

## 2021-11-10 MED ORDER — CERTOLIZUMAB PEGOL 2 X 200 MG ~~LOC~~ KIT
400.0000 mg | PACK | Freq: Once | SUBCUTANEOUS | Status: AC
  Administered 2021-11-10: 400 mg via SUBCUTANEOUS

## 2021-11-10 NOTE — Progress Notes (Signed)
Subjective:   Patient presents to clinic today to receive monthly dose of Cimzia.  Patient running a fever or have signs/symptoms of infection? No  Patient currently on antibiotics for the treatment of infection? No  Patient have any upcoming invasive procedures/surgeries? No  Objective: CMP     Component Value Date/Time   NA 140 10/13/2021 1532   NA 138 06/11/2018 1212   K 4.6 10/13/2021 1532   CL 105 10/13/2021 1532   CO2 30 10/13/2021 1532   GLUCOSE 91 10/13/2021 1532   BUN 21 10/13/2021 1532   BUN 9 06/11/2018 1212   CREATININE 0.99 10/13/2021 1532   CALCIUM 9.4 10/13/2021 1532   PROT 7.9 10/13/2021 1532   PROT 8.0 06/11/2018 1212   ALBUMIN 3.8 03/16/2021 1417   ALBUMIN 3.8 06/11/2018 1212   AST 15 10/13/2021 1532   ALT 13 10/13/2021 1532   ALKPHOS 67 03/16/2021 1417   BILITOT 0.4 10/13/2021 1532   BILITOT 0.3 06/11/2018 1212   GFRNONAA >60 03/30/2021 0310   GFRNONAA 72 07/02/2020 1106   GFRAA 84 07/02/2020 1106    CBC    Component Value Date/Time   WBC 4.9 10/13/2021 1532   RBC 4.29 10/13/2021 1532   HGB 12.8 10/13/2021 1532   HGB 11.9 06/11/2018 1212   HCT 38.8 10/13/2021 1532   HCT 36.6 06/11/2018 1212   PLT 258 10/13/2021 1532   PLT 310 06/11/2018 1212   MCV 90.4 10/13/2021 1532   MCV 93 06/11/2018 1212   MCH 29.8 10/13/2021 1532   MCHC 33.0 10/13/2021 1532   RDW 12.7 10/13/2021 1532   RDW 14.4 06/11/2018 1212   LYMPHSABS 2,386 10/13/2021 1532   LYMPHSABS 2.3 06/11/2018 1212   MONOABS 0.5 02/28/2021 1614   EOSABS 152 10/13/2021 1532   EOSABS 0.2 06/11/2018 1212   BASOSABS 29 10/13/2021 1532   BASOSABS 0.0 06/11/2018 1212    Baseline Immunosuppressant Therapy Labs TB GOLD Quantiferon TB Gold Latest Ref Rng & Units 03/23/2021  Quantiferon TB Gold Plus NEGATIVE NEGATIVE   Hepatitis Panel Hepatitis Latest Ref Rng & Units 06/13/2019  Hep B Surface Ag NON-REACTI NON-REACTIVE  Hep B IgM NON-REACTI NON-REACTIVE  Hep C Ab NON-REACTI NON-REACTIVE   Hep C Ab NON-REACTI NON-REACTIVE  Hep A IgM NON-REACTI NON-REACTIVE   HIV Lab Results  Component Value Date   HIV NON-REACTIVE 02/18/2020   Immunoglobulins Immunoglobulin Electrophoresis Latest Ref Rng & Units 02/18/2020  IgA  47 - 310 mg/dL 651(H)  IgG 600 - 1,640 mg/dL 2,218(H)  IgM 50 - 300 mg/dL 78   SPEP Serum Protein Electrophoresis Latest Ref Rng & Units 10/13/2021  Total Protein 6.1 - 8.1 g/dL 7.9  Albumin 3.8 - 4.8 g/dL -  Alpha-1 0.2 - 0.3 g/dL -  Alpha-2 0.5 - 0.9 g/dL -  Beta Globulin 0.4 - 0.6 g/dL -  Beta 2 0.2 - 0.5 g/dL -  Gamma Globulin 0.8 - 1.7 g/dL -   G6PD No results found for: G6PDH TPMT No results found for: TPMT   Chest x-ray: 12/12/2013 No acute abnormalities  Assessment/Plan:   Administrations This Visit     certolizumab pegol (CIMZIA) kit 400 mg     Admin Date 11/10/2021 Action Given Dose 400 mg Route Subcutaneous Administered By Carole Binning, LPN             Patient tolerated injection well.   Appointment for next injection scheduled for December 08, 2021.  Patient due for labs in April 2023.  Patient is to  call and reschedule appointment if running a fever with signs/symptoms of infection, on antibiotics for active infection or has an upcoming invasive procedure.  All questions encouraged and answered.  Instructed patient to call with any further questions or concerns.

## 2021-12-01 ENCOUNTER — Other Ambulatory Visit: Payer: Self-pay | Admitting: Internal Medicine

## 2021-12-08 ENCOUNTER — Ambulatory Visit (INDEPENDENT_AMBULATORY_CARE_PROVIDER_SITE_OTHER): Payer: 59 | Admitting: *Deleted

## 2021-12-08 ENCOUNTER — Other Ambulatory Visit: Payer: Self-pay

## 2021-12-08 VITALS — BP 156/99 | HR 83

## 2021-12-08 DIAGNOSIS — Z79899 Other long term (current) drug therapy: Secondary | ICD-10-CM

## 2021-12-08 DIAGNOSIS — M0579 Rheumatoid arthritis with rheumatoid factor of multiple sites without organ or systems involvement: Secondary | ICD-10-CM

## 2021-12-08 MED ORDER — CERTOLIZUMAB PEGOL 2 X 200 MG ~~LOC~~ KIT
400.0000 mg | PACK | Freq: Once | SUBCUTANEOUS | Status: AC
  Administered 2021-12-08: 400 mg via SUBCUTANEOUS

## 2021-12-08 NOTE — Progress Notes (Signed)
Subjective:   ?Patient presents to clinic today to receive monthly dose of Cimzia. ? ?Patient running a fever or have signs/symptoms of infection? No ? ?Patient currently on antibiotics for the treatment of infection? No ? ?Patient have any upcoming invasive procedures/surgeries? No ? ?Objective: ?CMP  ?   ?Component Value Date/Time  ? NA 140 10/13/2021 1532  ? NA 138 06/11/2018 1212  ? K 4.6 10/13/2021 1532  ? CL 105 10/13/2021 1532  ? CO2 30 10/13/2021 1532  ? GLUCOSE 91 10/13/2021 1532  ? BUN 21 10/13/2021 1532  ? BUN 9 06/11/2018 1212  ? CREATININE 0.99 10/13/2021 1532  ? CALCIUM 9.4 10/13/2021 1532  ? PROT 7.9 10/13/2021 1532  ? PROT 8.0 06/11/2018 1212  ? ALBUMIN 3.8 03/16/2021 1417  ? ALBUMIN 3.8 06/11/2018 1212  ? AST 15 10/13/2021 1532  ? ALT 13 10/13/2021 1532  ? ALKPHOS 67 03/16/2021 1417  ? BILITOT 0.4 10/13/2021 1532  ? BILITOT 0.3 06/11/2018 1212  ? GFRNONAA >60 03/30/2021 0310  ? GFRNONAA 72 07/02/2020 1106  ? GFRAA 84 07/02/2020 1106  ? ? ?CBC ?   ?Component Value Date/Time  ? WBC 4.9 10/13/2021 1532  ? RBC 4.29 10/13/2021 1532  ? HGB 12.8 10/13/2021 1532  ? HGB 11.9 06/11/2018 1212  ? HCT 38.8 10/13/2021 1532  ? HCT 36.6 06/11/2018 1212  ? PLT 258 10/13/2021 1532  ? PLT 310 06/11/2018 1212  ? MCV 90.4 10/13/2021 1532  ? MCV 93 06/11/2018 1212  ? MCH 29.8 10/13/2021 1532  ? MCHC 33.0 10/13/2021 1532  ? RDW 12.7 10/13/2021 1532  ? RDW 14.4 06/11/2018 1212  ? [facility] 2,386 10/13/2021 1532  ? LYMPHSABS 2.3 06/11/2018 1212  ? MONOABS 0.5 02/28/2021 1614  ? EOSABS 152 10/13/2021 1532  ? EOSABS 0.2 06/11/2018 1212  ? BASOSABS 29 10/13/2021 1532  ? BASOSABS 0.0 06/11/2018 1212  ? ? ?Baseline Immunosuppressant Therapy Labs ?TB GOLD ? ?  Latest Ref Rng & Units 03/23/2021  ?  2:07 PM  ?Quantiferon TB Gold  ?Quantiferon TB Gold Plus NEGATIVE NEGATIVE    ? ?Hepatitis Panel ? ?  Latest Ref Rng & Units 06/13/2019  ?  2:46 PM  ?Hepatitis  ?Hep B Surface Ag NON-REACTI NON-REACTIVE    ?Hep B IgM NON-REACTI  NON-REACTIVE    ?Hep C Ab NON-REACTI NON-REACTIVE    ?Hep A IgM NON-REACTI NON-REACTIVE    ? ?HIV ?Lab Results  ?Component Value Date  ? HIV NON-REACTIVE 02/18/2020  ? ?Immunoglobulins ? ?  Latest Ref Rng & Units 02/18/2020  ?  9:04 AM  ?Immunoglobulin Electrophoresis  ?IgA  47 - 310 mg/dL 651    ?IgG 600 - 1,640 mg/dL 2,218    ?IgM 50 - 300 mg/dL 78    ? ?SPEP ? ?  Latest Ref Rng & Units 10/13/2021  ?  3:32 PM  ?Serum Protein Electrophoresis  ?Total Protein 6.1 - 8.1 g/dL 7.9    ? ?G6PD ?No results found for: G6PDH ?TPMT ?No results found for: TPMT  ? ?Chest x-ray: 12/12/2013 No acute abnormalities ? ?Assessment/Plan:  ? ?Administrations This Visit   ? ? certolizumab pegol (CIMZIA) kit 400 mg   ? ? Admin Date ?12/08/2021 Action ?Given Dose ?400 mg Route ?Subcutaneous Administered By ?Carole Binning, LPN  ? ?  ?  ? ?  ?  ? ?Patient tolerated injection well.  ? ?Appointment for next injection scheduled for 01/05/2022.  Patient due for labs in April 2023.  Patient is  to call and reschedule appointment if running a fever with signs/symptoms of infection, on antibiotics for active infection or has an upcoming invasive procedure. ? ?All questions encouraged and answered.  Instructed patient to call with any further questions or concerns. ?  ?

## 2021-12-16 ENCOUNTER — Telehealth: Payer: Self-pay | Admitting: Pharmacist

## 2021-12-16 NOTE — Telephone Encounter (Signed)
Submitted request for Verification of Benefits for patient's in-office Cimzia via [employer] portal ?  ?Aracelia Brinson, PharmD, MPH, BCPS ?Clinical Pharmacist (Rheumatology and Pulmonology) ?

## 2021-12-22 NOTE — Telephone Encounter (Signed)
Received fax from [employer] for Cimzia in-office injections stating that request was received and is being reviewed. ? ?Knox Saliva, PharmD, MPH, BCPS ?Clinical Pharmacist (Rheumatology and Pulmonology) ?

## 2021-12-22 NOTE — Telephone Encounter (Signed)
Received fax from [facility] for precert for in-office Cimzia injections. Completed form and faxed to [facility] with clinicals ? ?Fax: [phone] ?Phone: [phone] ? ?Knox Saliva, PharmD, MPH, BCPS ?Clinical Pharmacist (Rheumatology and Pulmonology) ?

## 2021-12-26 NOTE — Telephone Encounter (Signed)
Rceived fax from [place]. Pre-certification for Cimzia in-office injection, [id number] was approved. Service dates are from 12/22/21 through 12/22/22 for 366 days ? ?Case # [id number] ? ?Called [employer] to backdate pre-certification to 0/3/01 however office is closed today. Will have to call tomorrow ? ?Knox Saliva, PharmD, MPH, BCPS ?Clinical Pharmacist (Rheumatology and Pulmonology) ?

## 2021-12-27 NOTE — Telephone Encounter (Signed)
Called [employer] to backdate Cimzia in-office pre-certification to 09/24/89. Per rep, they cannot backdate from date auth was submitted ? ?Phone: [phone] ?Case # [id number] ? ?Call ref # Miquel Dunn [id number] ? ?Knox Saliva, PharmD, MPH, BCPS ?Clinical Pharmacist (Rheumatology and Pulmonology) ?

## 2022-01-05 ENCOUNTER — Ambulatory Visit (INDEPENDENT_AMBULATORY_CARE_PROVIDER_SITE_OTHER): Payer: 59 | Admitting: *Deleted

## 2022-01-05 VITALS — BP 149/84 | HR 78

## 2022-01-05 DIAGNOSIS — M0579 Rheumatoid arthritis with rheumatoid factor of multiple sites without organ or systems involvement: Secondary | ICD-10-CM

## 2022-01-05 DIAGNOSIS — Z79899 Other long term (current) drug therapy: Secondary | ICD-10-CM

## 2022-01-05 MED ORDER — CERTOLIZUMAB PEGOL 2 X 200 MG ~~LOC~~ KIT
400.0000 mg | PACK | Freq: Once | SUBCUTANEOUS | Status: AC
  Administered 2022-01-05: 400 mg via SUBCUTANEOUS

## 2022-01-05 NOTE — Progress Notes (Signed)
Subjective:   ?Patient presents to clinic today to receive monthly dose of Cimzia. ? ?Patient running a fever or have signs/symptoms of infection? No ? ?Patient currently on antibiotics for the treatment of infection? No ? ?Patient have any upcoming invasive procedures/surgeries? No ? ?Objective: ?CMP  ?   ?Component Value Date/Time  ? NA 140 10/13/2021 1532  ? NA 138 06/11/2018 1212  ? K 4.6 10/13/2021 1532  ? CL 105 10/13/2021 1532  ? CO2 30 10/13/2021 1532  ? GLUCOSE 91 10/13/2021 1532  ? BUN 21 10/13/2021 1532  ? BUN 9 06/11/2018 1212  ? CREATININE 0.99 10/13/2021 1532  ? CALCIUM 9.4 10/13/2021 1532  ? PROT 7.9 10/13/2021 1532  ? PROT 8.0 06/11/2018 1212  ? ALBUMIN 3.8 03/16/2021 1417  ? ALBUMIN 3.8 06/11/2018 1212  ? AST 15 10/13/2021 1532  ? ALT 13 10/13/2021 1532  ? ALKPHOS 67 03/16/2021 1417  ? BILITOT 0.4 10/13/2021 1532  ? BILITOT 0.3 06/11/2018 1212  ? GFRNONAA >60 03/30/2021 0310  ? GFRNONAA 72 07/02/2020 1106  ? GFRAA 84 07/02/2020 1106  ? ? ?CBC ?   ?Component Value Date/Time  ? WBC 4.9 10/13/2021 1532  ? RBC 4.29 10/13/2021 1532  ? HGB 12.8 10/13/2021 1532  ? HGB 11.9 06/11/2018 1212  ? HCT 38.8 10/13/2021 1532  ? HCT 36.6 06/11/2018 1212  ? PLT 258 10/13/2021 1532  ? PLT 310 06/11/2018 1212  ? MCV 90.4 10/13/2021 1532  ? MCV 93 06/11/2018 1212  ? MCH 29.8 10/13/2021 1532  ? MCHC 33.0 10/13/2021 1532  ? RDW 12.7 10/13/2021 1532  ? RDW 14.4 06/11/2018 1212  ? [facility] 2,386 10/13/2021 1532  ? LYMPHSABS 2.3 06/11/2018 1212  ? MONOABS 0.5 02/28/2021 1614  ? EOSABS 152 10/13/2021 1532  ? EOSABS 0.2 06/11/2018 1212  ? BASOSABS 29 10/13/2021 1532  ? BASOSABS 0.0 06/11/2018 1212  ? ? ?Baseline Immunosuppressant Therapy Labs ?TB GOLD ? ?  Latest Ref Rng & Units 03/23/2021  ?  2:07 PM  ?Quantiferon TB Gold  ?Quantiferon TB Gold Plus NEGATIVE NEGATIVE    ? ?Hepatitis Panel ? ?  Latest Ref Rng & Units 06/13/2019  ?  2:46 PM  ?Hepatitis  ?Hep B Surface Ag NON-REACTI NON-REACTIVE    ?Hep B IgM NON-REACTI  NON-REACTIVE    ?Hep C Ab NON-REACTI NON-REACTIVE    ?Hep A IgM NON-REACTI NON-REACTIVE    ? ?HIV ?Lab Results  ?Component Value Date  ? HIV NON-REACTIVE 02/18/2020  ? ?Immunoglobulins ? ?  Latest Ref Rng & Units 02/18/2020  ?  9:04 AM  ?Immunoglobulin Electrophoresis  ?IgA  47 - 310 mg/dL 651    ?IgG 600 - 1,640 mg/dL 2,218    ?IgM 50 - 300 mg/dL 78    ? ?SPEP ? ?  Latest Ref Rng & Units 10/13/2021  ?  3:32 PM  ?Serum Protein Electrophoresis  ?Total Protein 6.1 - 8.1 g/dL 7.9    ? ?G6PD ?No results found for: G6PDH ?TPMT ?No results found for: TPMT  ? ?Chest x-ray:  12/12/2013 No acute abnormalities ? ?Assessment/Plan:  ? ?Administrations This Visit   ? ? certolizumab pegol (CIMZIA) kit 400 mg   ? ? Admin Date ?01/05/2022 Action ?Given Dose ?400 mg Route ?Subcutaneous Administered By ?Carole Binning, LPN  ? ?  ?  ? ?  ?  ? ?Patient tolerated injection well.  ? ?Appointment for next injection scheduled for 02/02/2022.  Patient due for labs today and were drawn in  office.  Patient is to call and reschedule appointment if running a fever with signs/symptoms of infection, on antibiotics for active infection or has an upcoming invasive procedure. ? ?All questions encouraged and answered.  Instructed patient to call with any further questions or concerns. ?  ?

## 2022-01-06 LAB — CBC WITH DIFFERENTIAL/PLATELET
Absolute Monocytes: 474 cells/uL (ref 200–950)
Basophils Absolute: 41 cells/uL (ref 0–200)
Basophils Relative: 0.8 %
Eosinophils Absolute: 133 cells/uL (ref 15–500)
Eosinophils Relative: 2.6 %
HCT: 37.4 % (ref 35.0–45.0)
Hemoglobin: 12.4 g/dL (ref 11.7–15.5)
Lymphs Abs: 2672 cells/uL (ref 850–3900)
MCH: 30.8 pg (ref 27.0–33.0)
MCHC: 33.2 g/dL (ref 32.0–36.0)
MCV: 92.8 fL (ref 80.0–100.0)
MPV: 11.2 fL (ref 7.5–12.5)
Monocytes Relative: 9.3 %
Neutro Abs: 1780 cells/uL (ref 1500–7800)
Neutrophils Relative %: 34.9 %
Platelets: 235 10*3/uL (ref 140–400)
RBC: 4.03 10*6/uL (ref 3.80–5.10)
RDW: 11.8 % (ref 11.0–15.0)
Total Lymphocyte: 52.4 %
WBC: 5.1 10*3/uL (ref 3.8–10.8)

## 2022-01-06 LAB — COMPLETE METABOLIC PANEL WITH GFR
AG Ratio: 0.9 (calc) — ABNORMAL LOW (ref 1.0–2.5)
ALT: 9 U/L (ref 6–29)
AST: 12 U/L (ref 10–35)
Albumin: 3.7 g/dL (ref 3.6–5.1)
Alkaline phosphatase (APISO): 82 U/L (ref 31–125)
BUN: 14 mg/dL (ref 7–25)
CO2: 28 mmol/L (ref 20–32)
Calcium: 9 mg/dL (ref 8.6–10.2)
Chloride: 107 mmol/L (ref 98–110)
Creat: 0.89 mg/dL (ref 0.50–0.99)
Globulin: 4.1 g/dL (calc) — ABNORMAL HIGH (ref 1.9–3.7)
Glucose, Bld: 112 mg/dL — ABNORMAL HIGH (ref 65–99)
Potassium: 4.4 mmol/L (ref 3.5–5.3)
Sodium: 142 mmol/L (ref 135–146)
Total Bilirubin: 0.4 mg/dL (ref 0.2–1.2)
Total Protein: 7.8 g/dL (ref 6.1–8.1)
eGFR: 81 mL/min/{1.73_m2} (ref >=60)

## 2022-01-06 NOTE — Progress Notes (Signed)
CBC and CMP are normal.

## 2022-01-12 ENCOUNTER — Telehealth: Payer: Self-pay | Admitting: Pharmacist

## 2022-01-12 NOTE — Telephone Encounter (Addendum)
Called [employer] precert to request if new authorization can be submitted from date of service of 09/18/21. Per rep, they cannot backdate but appeal can be submitted for dates of service. They state that EOBs should be submitted in addition to supporting clinical information and appeal form.  ? ?Call ref # [id number] ?Phone: [phone] ? ?Knox Saliva, PharmD, MPH, BCPS ?Clinical Pharmacist (Rheumatology and Pulmonology) ? ?----- Message from Cassandria Anger, RPH-CPP sent at 01/11/2022  3:55 PM EDT ----- ?Regarding: FW: SECURE   Dates of Service 10/13/21, 11/10/21 & 12/08/21-pending explanation  Charge is [id number] ? ?----- Message ----- ?From: Twanna Hy K ?Sent: 01/11/2022   3:44 PM EDT ?To: Shona Needles, RT, Cassandria Anger, RPH-CPP ?Subject: SECURE   Dates of Service 10/13/21, 11/10/21# ? ?Jennifer [name]  ? ?These charges out for Jennifer [name] are not being paid by the insurance Scientist, clinical (histocompatibility and immunogenetics)). They are the injection charge of the Chemother-Non-Hormone. These charges were paid in November but, not showing payment for Jan 2023 or Feb 2023 and pending reply for the Mar 2023 charges. The denials on the Jan and Feb are stating no auth., and non coverage. Can you advise on these please.  Thx Tisha  ? ? ?

## 2022-01-17 NOTE — Progress Notes (Signed)
? ?Office Visit Note ? ?Patient: Jennifer [name]             ?Date of Birth: 1975/12/21           ?MRN: [id number]             ?PCP: Burnis Medin, MD ?Referring: Burnis Medin, MD ?Visit Date: 01/31/2022 ?Occupation: '@GUAROCC'$ @ ? ?Subjective:  ?Right wrist pain ? ?History of Present Illness: Jennifer [name] is a 46 y.o. female with history of rheumatoid arthritis.  She returns today after her last visit in May 2022.  She states recently she has been having increased pain and discomfort in her joints.  She reports discomfort in her right wrist joint.  She has been noticing some numbness in her hands at nighttime.  She has not noticed any joint swelling.  She states that she had an episode of eye inflammation couple of weeks ago for which she was given a prednisone taper by Dr. Wyatt Portela.  She is still on prednisone.  She states she has been getting about 1 flare every year. ? ?Activities of Daily Living:  ?Patient reports morning stiffness for a few minutes.   ?Patient Denies nocturnal pain.  ?Difficulty dressing/grooming: Denies ?Difficulty climbing stairs: Denies ?Difficulty getting out of chair: Denies ?Difficulty using hands for taps, buttons, cutlery, and/or writing: Denies ? ?Review of Systems  ?Constitutional:  Negative for fatigue.  ?HENT:  Positive for mouth dryness. Negative for mouth sores and nose dryness.   ?Eyes:  Positive for photophobia, pain and redness. Negative for itching and dryness.  ?Respiratory:  Negative for shortness of breath and difficulty breathing.   ?Cardiovascular:  Negative for chest pain and palpitations.  ?Gastrointestinal:  Negative for blood in stool, constipation and diarrhea.  ?Endocrine: Negative for increased urination.  ?Genitourinary:  Negative for difficulty urinating.  ?Musculoskeletal:  Positive for joint pain, joint pain, joint swelling, myalgias, morning stiffness, muscle tenderness and myalgias.  ?Skin:  Negative for color change, rash and redness.   ?Allergic/Immunologic: Negative for susceptible to infections.  ?Neurological:  Positive for numbness. Negative for dizziness, headaches, memory loss and weakness.  ?Hematological:  Negative for bruising/bleeding tendency.  ?Psychiatric/Behavioral:  Negative for confusion.   ? ?PMFS History:  ?Patient Active Problem List  ? Diagnosis Date Noted  ? Primary osteoarthritis of right knee 03/28/2021  ? OA (osteoarthritis) of knee 04/02/2020  ? Pain in right knee 01/13/2020  ? Chronic pain of right knee 01/21/2019  ? S/P right knee arthroscopy 07/23/2018  ? 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  ? Primary osteoarthritis of both hands 02/04/2018  ? Sleep apnea, obstructive 09/28/2017  ? Primary osteoarthritis of both knees 05/22/2017  ? Incisional hernia 12/18/2016  ? Leiomyoma of uterus 09/06/2016  ? Fibroid, uterine   ? Essential hypertension 05/25/2015  ? Recurrent headache 05/25/2015  ? Head lump 07/31/2014  ? Edema 12/29/2013  ? Baker's cyst, ruptured 12/25/2013  ? Cough, persistent 12/12/2013  ? Left leg swelling 12/12/2013  ? Cough 12/12/2013  ? High risk medication use 12/12/2013  ? Recurrent periodic urticaria   ? Rheumatoid arthritis ([place]) 08/28/2012  ? CHRONIC LARYNGITIS 11/03/2010  ? ALLERGIC RHINITIS 11/03/2010  ? PARESTHESIA 07/28/2010  ? DYSMENORRHEA 10/15/2007  ? FIBROIDS, UTERUS 07/24/2007  ? OBESITY 07/24/2007  ? SLEEPLESSNESS 07/24/2007  ? HEADACHE 07/24/2007  ?  ?Past Medical History:  ?Diagnosis Date  ? Acute otitis media 08/28/2012  ? improved  change to liquid medication   ? Breast abscess of female   ?  Recurrent  ? Family history of adverse reaction to anesthesia   ? father hard time waking once (12/20/2016)  ? Fibroids   ? w/bleeding  ? GERD (gastroesophageal reflux disease)   ? History of blood transfusion   ? after surgery  ? Hypertension   ? Migraines   ? Dr. Orie Rout; "I have a few/month" (12/20/2016)  ? Osteoarthritis   ? Pill esophagitis 08/28/2012  ? amoxicillin  by hx  disc plan nl voice except  hoarse not drooling  close fu  if not getting better with plan stop aleve  liquid ibu onlyf necessary   ? Recurrent periodic urticaria   ? Rheumatoid arthritis ([place])   ? "55% of my body" (12/20/2016)  ? Rheumatoid arthritis ([place])   ? Sleep apnea   ? wears cpap  ?  ?Family History  ?Problem Relation Age of Onset  ? Hypertension Mother   ? Rheum arthritis Mother   ? Hypertension Father   ? Sleep apnea Father   ? ?Past Surgical History:  ?Procedure Laterality Date  ? BREAST SURGERY    ? abcess under left breast   ? CYST EXCISION Left 2014  ? "elbow"  ? HERNIA REPAIR    ? INCISION AND DRAINAGE BREAST ABSCESS Left 2003  ? INCISIONAL HERNIA REPAIR N/A 12/18/2016  ? Procedure: REPAIR INCISIONAL HERNIA WITH MESH;  Surgeon: Georganna Skeans, MD;  Location: [facility];  Service: General;  Laterality: N/A;  ? INSERTION OF MESH N/A 12/18/2016  ? Procedure: INSERTION OF MESH;  Surgeon: Georganna Skeans, MD;  Location: [facility];  Service: General;  Laterality: N/A;  ? KNEE ARTHROSCOPY WITH MENISCAL REPAIR Right 06/17/2018  ? Procedure: RIGHT KNEE ARTHROSCOPY WITH MENISCAL REPAIR;  Surgeon: Vickey Huger, MD;  Location: [facility];  Service: Orthopedics;  Laterality: Right;  ? MYOMECTOMY N/A 09/06/2016  ? Procedure: ABOMINAL MYOMECTOMY;  Surgeon: Governor Specking, MD;  Location: [facility];  Service: Gynecology;  Laterality: N/A;  ? TOTAL KNEE ARTHROPLASTY Right 03/28/2021  ? Procedure: TOTAL KNEE ARTHROPLASTY;  Surgeon: Gaynelle Arabian, MD;  Location: [facility];  Service: Orthopedics;  Laterality: Right;  24mn  ? UTERINE FIBROID SURGERY    ? 35 fibroids  ? ?Social History  ? ?Social History Narrative  ? Not on file  ? ?Immunization History  ?Administered Date(s) Administered  ? Influenza Split 08/21/2012  ? Influenza,inj,Quad PF,6+ Mos 07/31/2014, 08/19/2019  ? Pneumococcal Conjugate-13 08/28/2012  ?  ? ?Objective: ?Vital Signs: BP (!) 154/90 (BP Location: Left Arm, Patient Position: Sitting, Cuff Size: Large)   Pulse 60   Ht '5\' 4"'$  (1.626 m)   Wt 245 lb 3.2  oz (111.2 kg)   BMI 42.09 kg/m?   ? ?Physical Exam ?Vitals and nursing note reviewed.  ?Constitutional:   ?   Appearance: She is well-developed.  ?HENT:  ?   Head: Normocephalic and atraumatic.  ?Eyes:  ?   Conjunctiva/sclera: Conjunctivae normal.  ?Cardiovascular:  ?   Rate and Rhythm: Normal rate and regular rhythm.  ?   Heart sounds: Normal heart sounds.  ?Pulmonary:  ?   Effort: Pulmonary effort is normal.  ?   Breath sounds: Normal breath sounds.  ?Abdominal:  ?   General: Bowel sounds are normal.  ?   Palpations: Abdomen is soft.  ?Musculoskeletal:  ?   Cervical back: Normal range of motion.  ?Lymphadenopathy:  ?   Cervical: No cervical adenopathy.  ?Skin: ?   General: Skin is warm and dry.  ?   Capillary  Refill: Capillary refill takes less than 2 seconds.  ?Neurological:  ?   Mental Status: She is alert and oriented to person, place, and time.  ?Psychiatric:     ?   Behavior: Behavior normal.  ?  ? ?Musculoskeletal Exam: C-spine was in good range of motion.  Shoulder joints, elbow joints, left wrist joint, MCPs and PIPs were in good range of motion.  She had limited range of motion of her right wrist joint with tenderness on palpation.  Hip joints, knee joints, ankles were in good range of motion.  Left knee joint had limited extension which is replaced.  There was no tenderness over ankles or MTPs. ? ?CDAI Exam: ?CDAI Score: 1.4  ?Patient Global: 2 mm; Provider Global: 2 mm ?Swollen: 0 ; Tender: 1  ?Joint Exam 01/31/2022  ? ?   Right  Left  ?Wrist   Tender     ? ? ? ?Investigation: ?No additional findings. ? ?Imaging: ?DG Chest 2 View ? ?Result Date: 01/27/2022 ?CLINICAL DATA:  right thoracic pleuritic chest pain. EXAM: CHEST - 2 VIEW COMPARISON:  Radiograph December 12, 2013 FINDINGS: The heart size and mediastinal contours are within normal limits. No focal airspace consolidation. No pleural effusion. No pneumothorax. The visualized skeletal structures are unremarkable. IMPRESSION: No acute cardiopulmonary  process. Electronically Signed   By: Dahlia Bailiff M.D.   On: 01/27/2022 10:06  ? ?XR Foot 2 Views Left ? ?Result Date: 01/31/2022 ?Juxta-articular osteopenia was noted.  PIP and DIP narrowing was noted.  No MTP, intertars

## 2022-01-27 ENCOUNTER — Ambulatory Visit (INDEPENDENT_AMBULATORY_CARE_PROVIDER_SITE_OTHER): Payer: 59

## 2022-01-27 ENCOUNTER — Ambulatory Visit
Admission: RE | Admit: 2022-01-27 | Discharge: 2022-01-27 | Disposition: A | Discharge status: Home / Self Care | Payer: 59 | Source: Ambulatory Visit | Attending: Family Medicine | Admitting: Family Medicine

## 2022-01-27 VITALS — BP 151/104 | HR 73 | Temp 97.9°F | Resp 18

## 2022-01-27 DIAGNOSIS — M549 Dorsalgia, unspecified: Secondary | ICD-10-CM | POA: Diagnosis not present

## 2022-01-27 DIAGNOSIS — R0789 Other chest pain: Secondary | ICD-10-CM

## 2022-01-27 MED ORDER — IBUPROFEN 800 MG PO TABS: 800.0000 mg | ORAL_TABLET | Freq: Three times a day (TID) | ORAL | 0 refills | Status: DC | PRN

## 2022-01-27 MED ORDER — KETOROLAC TROMETHAMINE 30 MG/ML IJ SOLN
30.0000 mg | Freq: Once | INTRAMUSCULAR | Status: AC
  Administered 2022-01-27: 30 mg via INTRAMUSCULAR

## 2022-01-27 MED ORDER — METHOCARBAMOL 500 MG PO TABS: 500.0000 mg | ORAL_TABLET | Freq: Three times a day (TID) | ORAL | 0 refills | Status: DC | PRN

## 2022-01-27 NOTE — ED Triage Notes (Signed)
Pt present back pain from a knot that is located on her back. Pt state the knot is causing her to have pain in her chest  with difficulty breathing. Symptoms started Tuesday. ?

## 2022-01-27 NOTE — ED Provider Notes (Signed)
?[facility] ? ? ? ?CSN: [id number] ?Arrival date & time: 01/27/22  9678 ? ? ?  ? ?History   ?Chief Complaint ?Chief Complaint  ?Patient presents with  ? Back Pain  ?  I'm having excruciating back pain, my chest even hurts when I take deep breaths.. - Entered by patient  ? ? ?HPI ?Jennifer [name] is a 46 y.o. female.  ? ? ?Back Pain ?Here for pain in her upper right back, and has felt like there is a knot in the lower thoracic area on the right. Hurts worse to breath deeply, cough, move/twist her trunk. No f/c. No URI symptoms ? ?Has RA ? ?Past Medical History:  ?Diagnosis Date  ? Acute otitis media 08/28/2012  ? improved  change to liquid medication   ? Breast abscess of female   ? Recurrent  ? Family history of adverse reaction to anesthesia   ? father hard time waking once (12/20/2016)  ? Fibroids   ? w/bleeding  ? GERD (gastroesophageal reflux disease)   ? History of blood transfusion   ? after surgery  ? Hypertension   ? Migraines   ? Dr. Orie Rout; "I have a few/month" (12/20/2016)  ? Osteoarthritis   ? Pill esophagitis 08/28/2012  ? amoxicillin  by hx  disc plan nl voice except hoarse not drooling  close fu  if not getting better with plan stop aleve  liquid ibu onlyf necessary   ? Recurrent periodic urticaria   ? Rheumatoid arthritis ([place])   ? "55% of my body" (12/20/2016)  ? Rheumatoid arthritis ([place])   ? Sleep apnea   ? wears cpap  ? ? ?Patient Active Problem List  ? Diagnosis Date Noted  ? Primary osteoarthritis of right knee 03/28/2021  ? OA (osteoarthritis) of knee 04/02/2020  ? Pain in right knee 01/13/2020  ? Chronic pain of right knee 01/21/2019  ? S/P right knee arthroscopy 07/23/2018  ? 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  ? Primary osteoarthritis of both hands 02/04/2018  ? Sleep apnea, obstructive 09/28/2017  ? Primary osteoarthritis of both knees 05/22/2017  ? Incisional hernia 12/18/2016  ? Leiomyoma of uterus 09/06/2016  ? Fibroid, uterine   ? Essential hypertension 05/25/2015  ? Recurrent  headache 05/25/2015  ? Head lump 07/31/2014  ? Edema 12/29/2013  ? Baker's cyst, ruptured 12/25/2013  ? Cough, persistent 12/12/2013  ? Left leg swelling 12/12/2013  ? Cough 12/12/2013  ? High risk medication use 12/12/2013  ? Recurrent periodic urticaria   ? Rheumatoid arthritis ([place]) 08/28/2012  ? CHRONIC LARYNGITIS 11/03/2010  ? ALLERGIC RHINITIS 11/03/2010  ? PARESTHESIA 07/28/2010  ? DYSMENORRHEA 10/15/2007  ? FIBROIDS, UTERUS 07/24/2007  ? OBESITY 07/24/2007  ? SLEEPLESSNESS 07/24/2007  ? HEADACHE 07/24/2007  ? ? ?Past Surgical History:  ?Procedure Laterality Date  ? BREAST SURGERY    ? abcess under left breast   ? CYST EXCISION Left 2014  ? "elbow"  ? HERNIA REPAIR    ? INCISION AND DRAINAGE BREAST ABSCESS Left 2003  ? INCISIONAL HERNIA REPAIR N/A 12/18/2016  ? Procedure: REPAIR INCISIONAL HERNIA WITH MESH;  Surgeon: Georganna Skeans, MD;  Location: [facility];  Service: General;  Laterality: N/A;  ? INSERTION OF MESH N/A 12/18/2016  ? Procedure: INSERTION OF MESH;  Surgeon: Georganna Skeans, MD;  Location: [facility];  Service: General;  Laterality: N/A;  ? KNEE ARTHROSCOPY WITH MENISCAL REPAIR Right 06/17/2018  ? Procedure: RIGHT KNEE ARTHROSCOPY WITH MENISCAL REPAIR;  Surgeon: Vickey Huger, MD;  Location:  [facility];  Service: Orthopedics;  Laterality: Right;  ? MYOMECTOMY N/A 09/06/2016  ? Procedure: ABOMINAL MYOMECTOMY;  Surgeon: Governor Specking, MD;  Location: [facility];  Service: Gynecology;  Laterality: N/A;  ? TOTAL KNEE ARTHROPLASTY Right 03/28/2021  ? Procedure: TOTAL KNEE ARTHROPLASTY;  Surgeon: Gaynelle Arabian, MD;  [facility];  Service: Orthopedics;  Laterality: Right;  64mn  ? UTERINE FIBROID SURGERY    ? 35 fibroids  ? ? ?OB History   ? ? Gravida  ?1  ? Para  ?1  ? Term  ?   ? Preterm  ?   ? AB  ?   ? Living  ?   ?  ? ? SAB  ?   ? IAB  ?   ? Ectopic  ?   ? Multiple  ?   ? Live Births  ?   ?   ?  ?  ? ? ? ?Home Medications   ? ?Prior to Admission medications   ?Medication Sig Start Date End Date Taking?  Authorizing Provider  ?ibuprofen (ADVIL) 800 MG tablet Take 1 tablet (800 mg total) by mouth every 8 (eight) hours as needed (pain). 01/27/22  Yes BBarrett Henle MD  ?amLODipine (NORVASC) 2.5 MG tablet Take 1 tablet (2.5 mg total) by mouth daily. In addition to 5 mg amlodipine ( total 7.5 mg per day) 11/07/21   Panosh, WStandley Brooking MD  ?amLODipine (NORVASC) 5 MG tablet TAKE 1 TABLET BY MOUTH EVERY DAY 02/24/19   Panosh, WStandley Brooking MD  ?amLODipine (NORVASC) 5 MG tablet Take 1 tablet by mouth daily. 04/21/21   [provider]  ?aspirin 325 MG tablet Take 1 tablet (325 mg total) by mouth 2 (two) times daily. 03/29/21   CJonnie Kind PA-C  ?ASPIRIN 81 PO aspirin    [provider]  ?cetirizine (ZYRTEC) 10 MG tablet Take 10 mg by mouth daily as needed for allergies.    [provider]  ?gabapentin (NEURONTIN) 300 MG capsule Take a 300 mg capsule three times a day for two weeks following surgery.Then take a 300 mg capsule two times a day for two weeks. Then take a 300 mg capsule once a day for two weeks. Then discontinue. 03/29/21   CJonnie Kind PA-C  ?Levonorgestrel (KYLEENA) 19.5 MG IUD 19.5 mg by Intrauterine route once.    [provider]  ?methocarbamol (ROBAXIN) 500 MG tablet Take 1 tablet (500 mg total) by mouth every 8 (eight) hours as needed for muscle spasms. 01/27/22   BBarrett Henle MD  ?NON FORMULARY Pt uses cpap nightly    [provider]  ?nystatin-triamcinolone ointment (MYCOLOG) Apply 1 application topically 2 (two) times daily. 10/17/21   WTrula Slade DPM  ?Phentermine HCl (LOMAIRA) 8 MG TABS Lomaira 8 mg tablet ? TAKE 1/2 TO 1 TABLET BY MOUTH AT 10 AM 05/26/21   [provider]  ?ramelteon (ROZEREM) 8 MG tablet TAKE 1 TABLET BY MOUTH AT BEDTIME. 12/01/21   Panosh, WStandley Brooking MD  ?traMADol (ULTRAM) 50 MG tablet Take 1-2 tablets (50-100 mg total) by mouth every 6 (six) hours as needed for moderate pain. 03/29/21   CFenton FoyD, PA-C   ?triamcinolone (NASACORT) 55 MCG/ACT AERO nasal inhaler Nasal Allergy 55 mcg spray aerosol ? INHALE 2 SPRAYS IN EACH NOSTRIL AT NIGHT    [provider]  ?CIMZIA 2 X 200 MG KIT INJECT 400 MG UNDER THE SKIN EVERY 28 (TWENTY-EIGHT) DAYS 07/08/18 12/04/20  Deveshwar,  Abel Presto, MD  ?eletriptan (RELPAX) 40 MG tablet Take 1 tablet (40 mg total) by mouth as needed for migraine or headache. May repeat in 2 hours if headache persists or recurs. 10/02/19 11/29/20  Panosh, Standley Brooking, MD  ? ? ?Family History ?Family History  ?Problem Relation Age of Onset  ? Hypertension Mother   ? Rheum arthritis Mother   ? Hypertension Father   ? Sleep apnea Father   ? ? ?Social History ?Social History  ? ?Tobacco Use  ? Smoking status: Never  ? Smokeless tobacco: Never  ?Vaping Use  ? Vaping Use: Never used  ?Substance Use Topics  ? Alcohol use: Yes  ?  Comment: social  ? Drug use: Never  ? ? ? ?Allergies   ?Lisinopril and Lisinopril-hydrochlorothiazide ? ? ?Review of Systems ?Review of Systems  ?Musculoskeletal:  Positive for back pain.  ? ? ?Physical Exam ?Triage Vital Signs ?ED Triage Vitals [01/27/22 0926]  ?Enc Vitals Group  ?   BP (!) 151/104  ?   Pulse Rate 73  ?   Resp 18  ?   Temp 97.9 ?F (36.6 ?C)  ?   Temp Source Oral  ?   SpO2 96 %  ?   Weight   ?   Height   ?   Head Circumference   ?   Peak Flow   ?   Pain Score 7  ?   Pain Loc   ?   Pain Edu?   ?   Excl. in [place]?   ? ?No data found. ? ?Updated Vital Signs ?BP (!) 151/104 (BP Location: Right Arm)   Pulse 73   Temp 97.9 ?F (36.6 ?C) (Oral)   Resp 18   SpO2 96%  ? ?Visual Acuity ?Right Eye Distance:   ?Left Eye Distance:   ?Bilateral Distance:   ? ?Right Eye Near:   ?Left Eye Near:    ?Bilateral Near:    ? ?Physical Exam ?Vitals reviewed.  ?Constitutional:   ?   General: She is not in acute distress. ?   Appearance: She is not toxic-appearing.  ?HENT:  ?   Nose: Nose normal.  ?   Mouth/Throat:  ?   Mouth: Mucous membranes are moist.  ?Eyes:  ?   Extraocular Movements:  Extraocular movements intact.  ?   Conjunctiva/sclera: Conjunctivae normal.  ?   Pupils: Pupils are equal, round, and reactive to light.  ?Cardiovascular:  ?   Rate and Rhythm: Normal rate and regular rhythm.  ?   Heart sounds:

## 2022-01-27 NOTE — Discharge Instructions (Addendum)
You have been given a shot of Toradol 30 mg today.  ? ?Take ibuprofen 800 mg--1 tab every 8 hours as needed for pain.  ? ?Take methocarbamol 500 mg--1 hours as needed for muscle pain or muscle spasm. ?

## 2022-01-31 ENCOUNTER — Ambulatory Visit (INDEPENDENT_AMBULATORY_CARE_PROVIDER_SITE_OTHER): Payer: 59

## 2022-01-31 ENCOUNTER — Telehealth: Payer: Self-pay | Admitting: Pharmacist

## 2022-01-31 ENCOUNTER — Encounter: Payer: Self-pay | Admitting: Rheumatology

## 2022-01-31 ENCOUNTER — Ambulatory Visit (INDEPENDENT_AMBULATORY_CARE_PROVIDER_SITE_OTHER): Payer: 59 | Admitting: Rheumatology

## 2022-01-31 VITALS — BP 154/90 | HR 60 | Ht 64.0 in | Wt 245.2 lb

## 2022-01-31 DIAGNOSIS — M79671 Pain in right foot: Secondary | ICD-10-CM

## 2022-01-31 DIAGNOSIS — M79642 Pain in left hand: Secondary | ICD-10-CM | POA: Diagnosis not present

## 2022-01-31 DIAGNOSIS — M0579 Rheumatoid arthritis with rheumatoid factor of multiple sites without organ or systems involvement: Secondary | ICD-10-CM | POA: Diagnosis not present

## 2022-01-31 DIAGNOSIS — Z79899 Other long term (current) drug therapy: Secondary | ICD-10-CM | POA: Diagnosis not present

## 2022-01-31 DIAGNOSIS — M19042 Primary osteoarthritis, left hand: Secondary | ICD-10-CM

## 2022-01-31 DIAGNOSIS — H209 Unspecified iridocyclitis: Secondary | ICD-10-CM | POA: Diagnosis not present

## 2022-01-31 DIAGNOSIS — M19041 Primary osteoarthritis, right hand: Secondary | ICD-10-CM

## 2022-01-31 DIAGNOSIS — I10 Essential (primary) hypertension: Secondary | ICD-10-CM

## 2022-01-31 DIAGNOSIS — M79641 Pain in right hand: Secondary | ICD-10-CM

## 2022-01-31 DIAGNOSIS — M25531 Pain in right wrist: Secondary | ICD-10-CM

## 2022-01-31 DIAGNOSIS — Z9889 Other specified postprocedural states: Secondary | ICD-10-CM

## 2022-01-31 DIAGNOSIS — M170 Bilateral primary osteoarthritis of knee: Secondary | ICD-10-CM

## 2022-01-31 NOTE — Patient Instructions (Addendum)
Standing Labs ?We placed an order today for your standing lab work.  ? ?Please have your standing labs drawn in July and every 3 months ? ?If possible, please have your labs drawn 2 weeks prior to your appointment so that the provider can discuss your results at your appointment. ? ?Please note that you may see your imaging and lab results in [facility] before we have reviewed them. ?We may be awaiting multiple results to interpret others before contacting you. ?Please allow our office up to 72 hours to thoroughly review all of the results before contacting the office for clarification of your results. ? ?We have open lab daily: ?Monday through Thursday from 1:30-4:30 PM and Friday from 1:30-4:00 PM ?at the office of Dr. Bo Merino, [facility].   ?Please be advised, all patients with office appointments requiring lab work will take precedent over walk-in lab work.  ?If possible, please come for your lab work on Monday and Friday afternoons, as you may experience shorter wait times. ?The office is located at [address], [place], [place], [place] [address] ?No appointment is necessary.   ?Labs are drawn by [facility]. Please bring your co-pay at the time of your lab draw.  You may receive a bill from [facility] for your lab work. ? ?Please note if you are on Hydroxychloroquine and and an order has been placed for a Hydroxychloroquine level, you will need to have it drawn 4 hours or more after your last dose. ? ?If you wish to have your labs drawn at another location, please call the office 24 hours in advance to send orders. ? ?If you have any questions regarding directions or hours of operation,  ?please call [phone].   ?As a reminder, please drink plenty of water prior to coming for your lab work. Thanks!  ? ?Vaccines ?You are taking a medication(s) that can suppress your immune system.  The following immunizations are recommended: ?Flu annually ?Covid-19  ?Td/Tdap (tetanus, diphtheria, pertussis)  every 10 years ?Pneumonia (Prevnar 15 then Pneumovax 23 at least 1 year apart.  Alternatively, can take Prevnar 20 without needing additional dose) ?Shingrix: 2 doses from 4 weeks to 6 months apart ? ?Please check with your PCP to make sure you are up to date.  ? ?If you have signs or symptoms of an infection or start antibiotics: ?First, call your PCP for workup of your infection. ?Hold your medication through the infection, until you complete your antibiotics, and until symptoms resolve if you take the following: ?Injectable medication (Actemra, Benlysta, Cimzia, Cosentyx, Enbrel, Humira, Kevzara, Orencia, Remicade, Simponi, [place], [place], [place]) ?Methotrexate ?Methotrexate Injection ?What is this medication? ?METHOTREXATE (METH oh TREX ate) treats inflammatory conditions such as arthritis and psoriasis. It works by decreasing inflammation, which can reduce pain and prevent long-term injury to the joints and skin. It may also be used to treat some types of cancer. It works by slowing down the growth of cancer cells. ?This medicine may be used for other purposes; ask your health care provider or pharmacist if you have questions. ?What should I tell my care team before I take this medication? ?They need to know if you have any of these conditions: ?Fluid in the stomach area or lungs ?If you often drink alcohol ?Infection or immune system problems ?Kidney disease ?Liver disease ?Low blood counts (white cells, platelets, or red blood cells) ?Lung disease ?Recent or ongoing radiation ?Recent or upcoming vaccine ?Stomach ulcers ?Ulcerative colitis ?An unusual or allergic reaction to methotrexate,  other medications, foods, dyes, or preservatives ?Pregnant or trying to get pregnant ?Breast-feeding ?How should I use this medication? ?This medication is for infusion into a vein or for injection into muscle or into the spinal fluid (whichever applies). It is usually given in a hospital or clinic setting. ?In rare cases,  you might get this medication at home. You will be taught how to give this medication. Use exactly as directed. Take your medication at regular intervals. Do not take your medication more often than directed. ?If this medication is used for arthritis or psoriasis, it should be taken weekly, NOT daily. ?It is important that you put your used needles and syringes in a special sharps container. Do not put them in a trash can. If you do not have a sharps container, call your pharmacist or care team to get one. ?Talk to your care team about the use of this medication in children. While this medication may be prescribed for children as young as 2 years for selected conditions, precautions do apply. ?Overdosage: If you think you have taken too much of this medicine contact a poison control center or emergency room at once. ?NOTE: This medicine is only for you. Do not share this medicine with others. ?What if I miss a dose? ?It is important not to miss your dose. Call your care team if you are unable to keep an appointment. If you give yourself the medication, and you miss a dose, talk with your care team. Do not take double or extra doses. ?What may interact with this medication? ?Do not take this medication with any of the following: ?Acitretin ?This medication may also interact with the following: ?Aspirin or aspirin-like medications including salicylates ?Azathioprine ?Certain antibiotics like chloramphenicol, penicillin, tetracycline ?Certain medications that treat or prevent blood clots like warfarin, apixaban, dabigatran, and rivaroxaban ?Certain medications for stomach problems like esomeprazole, omeprazole, pantoprazole ?Cyclosporine ?Dapsone ?Diuretics ?Folic acid ?Gold ?Hydroxychloroquine ?Live virus vaccines ?Medications for infection like acyclovir, adefovir, amphotericin B, bacitracin, cidofovir, foscarnet, ganciclovir, gentamicin, pentamidine, vancomycin ?Mercaptopurine ?NSAIDs, medications for pain and  inflammation, like ibuprofen or naproxen ?Pamidronate ?Pemetrexed ?Penicillamine ?Phenylbutazone ?Phenytoin ?Probenecid ?Pyrimethamine ?Retinoids such as isotretinoin and tretinoin ?Steroid medications like prednisone or cortisone ?Sulfonamides like sulfasalazine and trimethoprim/sulfamethoxazole ?Theophylline ?Zoledronic acid ?This list may not describe all possible interactions. Give your health care provider a list of all the medicines, herbs, non-prescription drugs, or dietary supplements you use. Also tell them if you smoke, drink alcohol, or use illegal drugs. Some items may interact with your medicine. ?What should I watch for while using this medication? ?This medication may make you feel generally unwell. This is not uncommon as chemotherapy can affect healthy cells as well as cancer cells. Report any side effects. Continue your course of treatment even though you feel ill unless your care team tells you to stop. ?Your condition will be monitored carefully while you are receiving this medication. ?Avoid alcoholic drinks. ?This medication can cause serious side effects. To reduce the risk, your care team may give you other medications to take before receiving this one. Be sure to follow the directions from your care team. ?This medication can make you more sensitive to the sun. Keep out of the sun. If you cannot avoid being in the sun, wear protective clothing and use sunscreen. Do not use sun lamps or tanning beds/booths. ?You may get drowsy or dizzy. Do not drive, use machinery, or do anything that needs mental alertness until you know how this medication affects you.  Do not stand or sit up quickly, especially if you are an older patient. This reduces the risk of dizzy or fainting spells. ?You may need blood work while you are taking this medication. ?Call your care team for advice if you get a fever, chills or sore throat, or other symptoms of a cold or flu. Do not treat yourself. This medication  decreases your body's ability to fight infections. Try to avoid being around people who are sick. ?This medication may increase your risk to bruise or bleed. Call your care team if you notice any unusual bleeding.

## 2022-01-31 NOTE — Telephone Encounter (Signed)
Please start Rasuvo BIV. Was previously taking Rasuvo but d/c due to recurrent tonsillitis and subsequent knee surgery. Rasuvo was not restarted after surgery but clinically stable on Cimzia + Rasuvo combination therapy. ? ?Dose: 15 mg SQ every 7 days (once approved, can we ensure that is covers '20mg'$  dose as well as she will increase to 20 mg one month after starting after labs are rechecked) ? ?Dx: Rheumatoid arthritis (M05.9) ? ?Current regimen: Cimzia '400mg'$  SQ q 28 days in-office  ? ?Knox Saliva, PharmD, MPH, BCPS, CPP ?Clinical Pharmacist (Rheumatology and Pulmonology) ?

## 2022-02-02 ENCOUNTER — Ambulatory Visit (INDEPENDENT_AMBULATORY_CARE_PROVIDER_SITE_OTHER): Payer: 59 | Admitting: *Deleted

## 2022-02-02 VITALS — BP 155/90 | HR 61

## 2022-02-02 DIAGNOSIS — M0579 Rheumatoid arthritis with rheumatoid factor of multiple sites without organ or systems involvement: Secondary | ICD-10-CM

## 2022-02-02 MED ORDER — CERTOLIZUMAB PEGOL 2 X 200 MG ~~LOC~~ KIT
400.0000 mg | PACK | Freq: Once | SUBCUTANEOUS | Status: AC
  Administered 2022-02-02: 400 mg via SUBCUTANEOUS

## 2022-02-02 NOTE — Progress Notes (Signed)
Subjective:   Patient presents to clinic today to receive monthly dose of Cimzia.  Patient running a fever or have signs/symptoms of infection? No  Patient currently on antibiotics for the treatment of infection? No  Patient have any upcoming invasive procedures/surgeries? No  Objective: CMP     Component Value Date/Time   NA 142 01/05/2022 1518   NA 138 06/11/2018 1212   K 4.4 01/05/2022 1518   CL 107 01/05/2022 1518   CO2 28 01/05/2022 1518   GLUCOSE 112 (H) 01/05/2022 1518   BUN 14 01/05/2022 1518   BUN 9 06/11/2018 1212   CREATININE 0.89 01/05/2022 1518   CALCIUM 9.0 01/05/2022 1518   PROT 7.8 01/05/2022 1518   PROT 8.0 06/11/2018 1212   ALBUMIN 3.8 03/16/2021 1417   ALBUMIN 3.8 06/11/2018 1212   AST 12 01/05/2022 1518   ALT 9 01/05/2022 1518   ALKPHOS 67 03/16/2021 1417   BILITOT 0.4 01/05/2022 1518   BILITOT 0.3 06/11/2018 1212   GFRNONAA >60 03/30/2021 0310   GFRNONAA 72 07/02/2020 1106   GFRAA 84 07/02/2020 1106    CBC    Component Value Date/Time   WBC 5.1 01/05/2022 1518   RBC 4.03 01/05/2022 1518   HGB 12.4 01/05/2022 1518   HGB 11.9 06/11/2018 1212   HCT 37.4 01/05/2022 1518   HCT 36.6 06/11/2018 1212   PLT 235 01/05/2022 1518   PLT 310 06/11/2018 1212   MCV 92.8 01/05/2022 1518   MCV 93 06/11/2018 1212   MCH 30.8 01/05/2022 1518   MCHC 33.2 01/05/2022 1518   RDW 11.8 01/05/2022 1518   RDW 14.4 06/11/2018 1212   LYMPHSABS 2,672 01/05/2022 1518   LYMPHSABS 2.3 06/11/2018 1212   MONOABS 0.5 02/28/2021 1614   EOSABS 133 01/05/2022 1518   EOSABS 0.2 06/11/2018 1212   BASOSABS 41 01/05/2022 1518   BASOSABS 0.0 06/11/2018 1212    Baseline Immunosuppressant Therapy Labs TB GOLD    Latest Ref Rng & Units 03/23/2021    2:07 PM  Quantiferon TB Gold  Quantiferon TB Gold Plus NEGATIVE NEGATIVE     Hepatitis Panel    Latest Ref Rng & Units 06/13/2019    2:46 PM  Hepatitis  Hep B Surface Ag NON-REACTI NON-REACTIVE    Hep B IgM NON-REACTI  NON-REACTIVE    Hep C Ab NON-REACTI NON-REACTIVE    Hep A IgM NON-REACTI NON-REACTIVE     HIV Lab Results  Component Value Date   HIV NON-REACTIVE 02/18/2020   Immunoglobulins    Latest Ref Rng & Units 02/18/2020    9:04 AM  Immunoglobulin Electrophoresis  IgA  47 - 310 mg/dL 651    IgG 600 - 1,640 mg/dL 2,218    IgM 50 - 300 mg/dL 78     SPEP    Latest Ref Rng & Units 01/05/2022    3:18 PM  Serum Protein Electrophoresis  Total Protein 6.1 - 8.1 g/dL 7.8     G6PD No results found for: G6PDH TPMT No results found for: TPMT   Chest x-ray: 12/12/2013 No acute abnormalities  Assessment/Plan:   Administrations This Visit     certolizumab pegol (CIMZIA) kit 400 mg     Admin Date 02/02/2022 Action Given Dose 400 mg Route Subcutaneous Administered By Carole Binning, LPN            Patient tolerated injection well.   Appointment for next injection scheduled for 03/02/2022. Patient due for labs in July 2023.  Patient is to  call and reschedule appointment if running a fever with signs/symptoms of infection, on antibiotics for active infection or has an upcoming invasive procedure.  All questions encouraged and answered.  Instructed patient to call with any further questions or concerns.

## 2022-02-08 ENCOUNTER — Encounter: Payer: Self-pay | Admitting: Gastroenterology

## 2022-02-08 NOTE — Telephone Encounter (Signed)
Submitted a Prior Authorization request to [employer] for RASUVO via CoverMyMeds. Will update once we receive a response.  Key: [id number]  Knox Saliva, PharmD, MPH, BCPS, CPP Clinical Pharmacist (Rheumatology and Pulmonology)

## 2022-02-10 ENCOUNTER — Other Ambulatory Visit (HOSPITAL_COMMUNITY): Payer: Self-pay

## 2022-02-10 NOTE — Telephone Encounter (Signed)
Per CMM response, drug is covered by current benefit plan. No further PA activity needed  Copay for 28 day supply is $30.  Copay card covers up to $125 per 30 day supply. Enrolled patient into Rasuvo copay card: BIN: [id number] PCN: [id number] GROUP: [id number] ID: [id number]  Patient can fill through [facility] but she will have to call them to initiate fill and set up mail delivery.  I've reached out to [facility] buyer to determine if Rasuvo '15mg'$  can be ordered or if it remains backordered.  Knox Saliva, PharmD, MPH, BCPS, CPP Clinical Pharmacist (Rheumatology and Pulmonology)

## 2022-02-21 NOTE — Telephone Encounter (Signed)
ATC patient regarding Rasuvo approval. Unable to reach. Left VM requesting return call  Knox Saliva, PharmD, MPH, BCPS, CPP Clinical Pharmacist (Rheumatology and Pulmonology)

## 2022-02-23 ENCOUNTER — Ambulatory Visit (INDEPENDENT_AMBULATORY_CARE_PROVIDER_SITE_OTHER): Payer: 59 | Admitting: Adult Health

## 2022-02-23 ENCOUNTER — Encounter: Payer: Self-pay | Admitting: Adult Health

## 2022-02-23 DIAGNOSIS — G4733 Obstructive sleep apnea (adult) (pediatric): Secondary | ICD-10-CM

## 2022-02-23 NOTE — Assessment & Plan Note (Signed)
Severe OSA - encouraged to increase daily usage to >6hr each night  No change in setting, great control  - discussed how weight can impact sleep and risk for sleep disordered breathing - discussed options to assist with weight loss: combination of diet modification, cardiovascular and strength training exercises   - had an extensive discussion regarding the adverse health consequences related to untreated sleep disordered breathing - specifically discussed the risks for hypertension, coronary artery disease, cardiac dysrhythmias, cerebrovascular disease, and diabetes - lifestyle modification discussed   - discussed how sleep disruption can increase risk of accidents, particularly when driving - safe driving practices were discussed    Plan  Patient Instructions  Wear CPAP At bedtime all night .  Work on healthy weight loss  Do not drive if sleepy .  Follow up in 4 months and As needed

## 2022-02-23 NOTE — Progress Notes (Signed)
_0  ID: Jennifer [name], female    DOB: [birth year], 46 y.o.   MRN: [id number]  Chief Complaint  Patient presents with   Consult    Referring provider: Burnis Medin, MD  HPI: 46 year old female seen for sleep consult February 23, 2022 to establish for sleep apnea  TEST/EVENTS :   02/23/2022 Sleep consult  Patient presents for sleep consult today.  Kindly referred by her primary care provider Dr. Regis Bill.  Patient says she has underlying sleep apnea has been on CPAP for some time. positive HST 06/2017  for OSA with an AHI=53.8  Was started on CPAP by [place] in 09/2017.   She says no one has been monitoring this for a while now.  She says she wears her CPAP every single night.  Typically gets in about 5  hours of sleep.  Typically goes to bed about 9 to 10 PM.  Takes about an hour to go to sleep.  Is up about 2 times each night.  And gets up in the morning about 6 AM.  She does not operat e heavy machinery at work.  Weight is down about 30 pounds.  Current weight is at 247 pounds with a BMI of 42.  Patient uses [facility] for her CPAP supplies. Sleeps better with CPAP . CPAP download shows good compliance with dailly average usage at 6hrs, on CPAP auto 10-20cmH2O. AHI 1.2/hr.  No symptoms suspicious for cataplexy or sleep paralysis. Caffeine intake minimal . Trying to exercise more. Has intermittent insomnia, takes rozerem on occasion Epworth score is 3 out of 24.  Mainly gets sleepy if she is watching TV.   Medical history significant for hypertension, chronic allergies and sleep apnea, rheumatoid arthritis osteoarthritis , uterine fibroids, Migraines   Surgical history  Knee surgery, hernia repair  Social history patient is single.  Works in the [employer].  Drinks alcohol socially.  Never smoker.  No drug use. No kids   Family history positive for chronic allergies, Hypertension, RA , OA , OSA ,     Allergies  Allergen Reactions   Lisinopril Anaphylaxis    Lisinopril-Hydrochlorothiazide Swelling and Other (See Comments)    Angioedema  Has tolerated maxide in past so most likely  The ACE inhibitor as the cause     Immunization History  Administered Date(s) Administered   Influenza Split 08/21/2012   Influenza,inj,Quad PF,6+ Mos 07/31/2014, 08/19/2019   Pneumococcal Conjugate-13 08/28/2012    Past Medical History:  Diagnosis Date   Acute otitis media 08/28/2012   improved  change to liquid medication    Breast abscess of female    Recurrent   Family history of adverse reaction to anesthesia    father hard time waking once (12/20/2016)   Fibroids    w/bleeding   GERD (gastroesophageal reflux disease)    History of blood transfusion    after surgery   Hypertension    Migraines    Dr. Orie Rout; "I have a few/month" (12/20/2016)   Osteoarthritis    Pill esophagitis 08/28/2012   amoxicillin  by hx  disc plan nl voice except hoarse not drooling  close fu  if not getting better with plan stop aleve  liquid ibu onlyf necessary    Recurrent periodic urticaria    Rheumatoid arthritis ([place])    "55% of my body" (12/20/2016)   Rheumatoid arthritis ([place])    Sleep apnea    wears cpap    Tobacco History: Social History  Tobacco Use  Smoking Status Never   Passive exposure: Never  Smokeless Tobacco Never   Counseling given: Not Answered   Outpatient Medications Prior to Visit  Medication Sig Dispense Refill   amLODipine (NORVASC) 2.5 MG tablet Take 1 tablet (2.5 mg total) by mouth daily. In addition to 5 mg amlodipine ( total 7.5 mg per day) 90 tablet 1   amLODipine (NORVASC) 5 MG tablet TAKE 1 TABLET BY MOUTH EVERY DAY 90 tablet 1   certolizumab pegol (CIMZIA) 2 X 200 MG KIT Inject 400 mg into the skin every 28 (twenty-eight) days.     cetirizine (ZYRTEC) 10 MG tablet Take 10 mg by mouth daily as needed for allergies.     gabapentin (NEURONTIN) 300 MG capsule Take a 300 mg capsule three times a day for two weeks following  surgery.Then take a 300 mg capsule two times a day for two weeks. Then take a 300 mg capsule once a day for two weeks. Then discontinue. (Patient taking differently: as needed. Take a 300 mg capsule three times a day for two weeks following surgery.Then take a 300 mg capsule two times a day for two weeks. Then take a 300 mg capsule once a day for two weeks. Then discontinue.) 84 capsule 0   ibuprofen (ADVIL) 800 MG tablet Take 1 tablet (800 mg total) by mouth every 8 (eight) hours as needed (pain). 21 tablet 0   Levonorgestrel (KYLEENA) 19.5 MG IUD 19.5 mg by Intrauterine route once.     methocarbamol (ROBAXIN) 500 MG tablet Take 1 tablet (500 mg total) by mouth every 8 (eight) hours as needed for muscle spasms. 20 tablet 0   NON FORMULARY Pt uses cpap nightly     nystatin-triamcinolone ointment (MYCOLOG) Apply 1 application topically 2 (two) times daily. 30 g 0   prednisoLONE acetate (PRED FORTE) 1 % ophthalmic suspension Place 1 drop into the left eye 2 (two) times daily.     ramelteon (ROZEREM) 8 MG tablet TAKE 1 TABLET BY MOUTH AT BEDTIME. (Patient taking differently: at bedtime as needed.) 90 tablet 1   triamcinolone (NASACORT) 55 MCG/ACT AERO nasal inhaler Nasal Allergy 55 mcg spray aerosol  INHALE 2 SPRAYS IN EACH NOSTRIL AT NIGHT     No facility-administered medications prior to visit.     Review of Systems:   Constitutional:   No  weight loss, night sweats,  Fevers, chills, fatigue, or  lassitude.  HEENT:   No headaches,  Difficulty swallowing,  Tooth/dental problems, or  Sore throat,                No sneezing, itching, ear ache, nasal congestion, post nasal drip,   CV:  No chest pain,  Orthopnea, PND, swelling in lower extremities, anasarca, dizziness, palpitations, syncope.   GI  No heartburn, indigestion, abdominal pain, nausea, vomiting, diarrhea, change in bowel habits, loss of appetite, bloody stools.   Resp: No shortness of breath with exertion or at rest.  No excess mucus,  no productive cough,  No non-productive cough,  No coughing up of blood.  No change in color of mucus.  No wheezing.  No chest wall deformity  Skin: no rash or lesions.  GU: no dysuria, change in color of urine, no urgency or frequency.  No flank pain, no hematuria   MS:  No joint pain or swelling.  No decreased range of motion.  No back pain.    Physical Exam  BP 138/78 (BP Location: Left Arm, Patient Position:  Sitting, Cuff Size: Large)   Pulse 71   Temp 98.1 F (36.7 C) (Oral)   Ht _0  (1.626 m)   Wt 247 lb 12.8 oz (112.4 kg)   SpO2 99%   BMI 42.53 kg/m   GEN: A/Ox3; pleasant , NAD, well nourished    HEENT:  [place]/AT,  , NOSE-clear, THROAT-clear, no lesions, no postnasal drip or exudate noted. Class 2-3 MP airway , Tonsills 2+   NECK:  Supple w/ fair ROM; no JVD; normal carotid impulses w/o bruits; no thyromegaly or nodules palpated; no lymphadenopathy.    RESP  Clear  P & A; w/o, wheezes/ rales/ or rhonchi. no accessory muscle use, no dullness to percussion  CARD:  RRR, no m/r/g, no peripheral edema, pulses intact, no cyanosis or clubbing.  GI:   Soft & nt; nml bowel sounds; no organomegaly or masses detected.   Musco: Warm bil, no deformities or joint swelling noted.   Neuro: alert, no focal deficits noted.    Skin: Warm, no lesions or rashes    Lab Results:  CBC    Component Value Date/Time   WBC 5.1 01/05/2022 1518   RBC 4.03 01/05/2022 1518   HGB 12.4 01/05/2022 1518   HGB 11.9 06/11/2018 1212   HCT 37.4 01/05/2022 1518   HCT 36.6 06/11/2018 1212   PLT 235 01/05/2022 1518   PLT 310 06/11/2018 1212   MCV 92.8 01/05/2022 1518   MCV 93 06/11/2018 1212   MCH 30.8 01/05/2022 1518   MCHC 33.2 01/05/2022 1518   RDW 11.8 01/05/2022 1518   RDW 14.4 06/11/2018 1212   LYMPHSABS 2,672 01/05/2022 1518   LYMPHSABS 2.3 06/11/2018 1212   MONOABS 0.5 02/28/2021 1614   EOSABS 133 01/05/2022 1518   EOSABS 0.2 06/11/2018 1212   BASOSABS 41 01/05/2022 1518    BASOSABS 0.0 06/11/2018 1212    BMET    Component Value Date/Time   NA 142 01/05/2022 1518   NA 138 06/11/2018 1212   K 4.4 01/05/2022 1518   CL 107 01/05/2022 1518   CO2 28 01/05/2022 1518   GLUCOSE 112 (H) 01/05/2022 1518   BUN 14 01/05/2022 1518   BUN 9 06/11/2018 1212   CREATININE 0.89 01/05/2022 1518   CALCIUM 9.0 01/05/2022 1518   GFRNONAA >60 03/30/2021 0310   GFRNONAA 72 07/02/2020 1106   GFRAA 84 07/02/2020 1106    BNP No results found for: "BNP"  ProBNP No results found for: "PROBNP"  Imaging: XR Foot 2 Views Left  Result Date: 01/31/2022 Juxta-articular osteopenia was noted.  PIP and DIP narrowing was noted.  No MTP, intertarsal or tibiotalar narrowing was noted.  Possible subtalar narrowing was noted.  Inferior and posterior calcaneal spurs were noted. Impression: These findings are consistent with rheumatoid arthritis and osteoarthritis overlap.  XR Foot 2 Views Right  Result Date: 01/31/2022 Juxta-articular osteopenia was noted.  PIP and DIP narrowing was noted.  Intertarsal narrowing was noted.  No tibiotalar or subtalar joint space narrowing was noted.  Inferior and posterior calcaneal spurs were noted.  No erosive changes were noted. Impression: These findings are consistent with rheumatoid arthritis and osteoarthritis overlap.  XR Hand 2 View Left  Result Date: 01/31/2022 CMC, and PIP narrowing was noted.  No MCP narrowing was noted.  Juxta-articular osteopenia was noted.  No intercarpal or radiocarpal joint space narrowing was noted.  No erosive changes were noted. Impression: These findings are consistent with rheumatoid arthritis and osteoarthritis overlap.  XR Hand 2 View Right  Result Date: 01/31/2022 Juxta-articular osteopenia was noted.  No MCP narrowing was noted.  PIP narrowing was noted.  Severe intercarpal and radiocarpal narrowing was noted.  Possible erosive changes were noted in the carpal bones.  CMC narrowing was noted.  No comparison films  were available. Impression: These findings are consistent with rheumatoid arthritis and osteoarthritis overlap.  DG Chest 2 View  Result Date: 01/27/2022 CLINICAL DATA:  right thoracic pleuritic chest pain. EXAM: CHEST - 2 VIEW COMPARISON:  Radiograph December 12, 2013 FINDINGS: The heart size and mediastinal contours are within normal limits. No focal airspace consolidation. No pleural effusion. No pneumothorax. The visualized skeletal structures are unremarkable. IMPRESSION: No acute cardiopulmonary process. Electronically Signed   By: Dahlia Bailiff M.D.   On: 11/16/3141 88:87    certolizumab pegol (CIMZIA) kit 400 mg     Date Action Dose Route User   Discharged on 01/27/2022   Admitted on 01/27/2022   01/05/2022 1537 Given 400 mg Subcutaneous (Left Anterior Thigh) Carole Binning, LPN      certolizumab pegol (CIMZIA) kit 400 mg     Date Action Dose Route User   02/02/2022 1137 Given 400 mg Subcutaneous (Left Lower Abdomen) Carole Binning, LPN           No data to display          No results found for: "NITRICOXIDE"      Assessment & Plan:   OSA (obstructive sleep apnea) Severe OSA - encouraged to increase daily usage to >6hr each night  No change in setting, great control  - discussed how weight can impact sleep and risk for sleep disordered breathing - discussed options to assist with weight loss: combination of diet modification, cardiovascular and strength training exercises   - had an extensive discussion regarding the adverse health consequences related to untreated sleep disordered breathing - specifically discussed the risks for hypertension, coronary artery disease, cardiac dysrhythmias, cerebrovascular disease, and diabetes - lifestyle modification discussed   - discussed how sleep disruption can increase risk of accidents, particularly when driving - safe driving practices were discussed    Plan  Patient Instructions  Wear CPAP At bedtime all night .   Work on healthy weight loss  Do not drive if sleepy .  Follow up in 4 months and As needed        Morbid obesity ([place]) Healthy weight loss      Rexene Edison, NP 02/23/2022

## 2022-02-23 NOTE — Patient Instructions (Addendum)
Wear CPAP At bedtime all night .  Work on healthy weight loss  Do not drive if sleepy .  Follow up in 4 months and As needed

## 2022-02-23 NOTE — Assessment & Plan Note (Signed)
Healthy weight loss 

## 2022-03-02 ENCOUNTER — Ambulatory Visit: Payer: 59

## 2022-03-06 ENCOUNTER — Other Ambulatory Visit (HOSPITAL_COMMUNITY): Payer: Self-pay

## 2022-03-06 MED ORDER — RASUVO 15 MG/0.3ML ~~LOC~~ SOAJ
15.0000 mg | SUBCUTANEOUS | 0 refills | Status: DC
  Filled 2022-03-06: qty 3.6, 84d supply, fill #0

## 2022-03-06 MED ORDER — FOLIC ACID 1 MG PO TABS
2.0000 mg | ORAL_TABLET | Freq: Every day | ORAL | 3 refills | Status: DC
  Filled 2022-03-06: qty 180, 90d supply, fill #0

## 2022-03-06 NOTE — Telephone Encounter (Signed)
Called patient regarding her Rasuvo approval. Informed patient of the copay card and she requested the information be provided to her tomorrow during her appointment for her Cimzia injection. Will put the copay card information into her AVS for Seth Bake to review during their appointment tomorrow. Prescription will be sent to the [facility].  Rodolph Bong, PharmD Candidate 03/06/2022 10:11 AM

## 2022-03-06 NOTE — Patient Instructions (Signed)
  The Rasuvo prescription was sent to [facility]. Their phone number is [phone]. Please call them if you want it mailed to your house otherwise you can go pick it up.  You will need to take Rasuvo (1 pen per week) and restart folic acid '2mg'$  daily.  Copay card covers up to $125 per 30 day supply. That information is:  BIN: [id number] PCN: [id number] [id number] ID: [id number]

## 2022-03-07 ENCOUNTER — Other Ambulatory Visit (HOSPITAL_COMMUNITY): Payer: Self-pay

## 2022-03-07 ENCOUNTER — Ambulatory Visit (INDEPENDENT_AMBULATORY_CARE_PROVIDER_SITE_OTHER): Payer: 59 | Admitting: *Deleted

## 2022-03-07 VITALS — BP 158/96 | HR 75

## 2022-03-07 DIAGNOSIS — M0579 Rheumatoid arthritis with rheumatoid factor of multiple sites without organ or systems involvement: Secondary | ICD-10-CM | POA: Diagnosis not present

## 2022-03-07 MED ORDER — CERTOLIZUMAB PEGOL 2 X 200 MG ~~LOC~~ KIT
400.0000 mg | PACK | Freq: Once | SUBCUTANEOUS | Status: AC
  Administered 2022-03-07: 400 mg via SUBCUTANEOUS

## 2022-03-07 NOTE — Progress Notes (Signed)
Pharmacy Note  Subjective:   Patient presents to clinic today to receive monthly dose of Cimzia.  Patient running a fever or have signs/symptoms of infection? No  Patient currently on antibiotics for the treatment of infection? No  Patient have any upcoming invasive procedures/surgeries? No  Objective: CMP     Component Value Date/Time   NA 142 01/05/2022 1518   NA 138 06/11/2018 1212   K 4.4 01/05/2022 1518   CL 107 01/05/2022 1518   CO2 28 01/05/2022 1518   GLUCOSE 112 (H) 01/05/2022 1518   BUN 14 01/05/2022 1518   BUN 9 06/11/2018 1212   CREATININE 0.89 01/05/2022 1518   CALCIUM 9.0 01/05/2022 1518   PROT 7.8 01/05/2022 1518   PROT 8.0 06/11/2018 1212   ALBUMIN 3.8 03/16/2021 1417   ALBUMIN 3.8 06/11/2018 1212   AST 12 01/05/2022 1518   ALT 9 01/05/2022 1518   ALKPHOS 67 03/16/2021 1417   BILITOT 0.4 01/05/2022 1518   BILITOT 0.3 06/11/2018 1212   GFRNONAA >60 03/30/2021 0310   GFRNONAA 72 07/02/2020 1106   GFRAA 84 07/02/2020 1106    CBC    Component Value Date/Time   WBC 5.1 01/05/2022 1518   RBC 4.03 01/05/2022 1518   HGB 12.4 01/05/2022 1518   HGB 11.9 06/11/2018 1212   HCT 37.4 01/05/2022 1518   HCT 36.6 06/11/2018 1212   PLT 235 01/05/2022 1518   PLT 310 06/11/2018 1212   MCV 92.8 01/05/2022 1518   MCV 93 06/11/2018 1212   MCH 30.8 01/05/2022 1518   MCHC 33.2 01/05/2022 1518   RDW 11.8 01/05/2022 1518   RDW 14.4 06/11/2018 1212   LYMPHSABS 2,672 01/05/2022 1518   LYMPHSABS 2.3 06/11/2018 1212   MONOABS 0.5 02/28/2021 1614   EOSABS 133 01/05/2022 1518   EOSABS 0.2 06/11/2018 1212   BASOSABS 41 01/05/2022 1518   BASOSABS 0.0 06/11/2018 1212    Baseline Immunosuppressant Therapy Labs TB GOLD    Latest Ref Rng & Units 03/23/2021    2:07 PM  Quantiferon TB Gold  Quantiferon TB Gold Plus NEGATIVE NEGATIVE    Hepatitis Panel    Latest Ref Rng & Units 06/13/2019    2:46 PM  Hepatitis  Hep B Surface Ag NON-REACTI NON-REACTIVE   Hep B IgM  NON-REACTI NON-REACTIVE   Hep C Ab NON-REACTI NON-REACTIVE   Hep A IgM NON-REACTI NON-REACTIVE    HIV Lab Results  Component Value Date   HIV NON-REACTIVE 02/18/2020   Immunoglobulins    Latest Ref Rng & Units 02/18/2020    9:04 AM  Immunoglobulin Electrophoresis  IgA  47 - 310 mg/dL 651   IgG 600 - 1,640 mg/dL 2,218   IgM 50 - 300 mg/dL 78    SPEP    Latest Ref Rng & Units 01/05/2022    3:18 PM  Serum Protein Electrophoresis  Total Protein 6.1 - 8.1 g/dL 7.8    G6PD No results found for: "G6PDH" TPMT No results found for: "TPMT"   Chest x-ray: 12/12/2013 No acute abnormalities  Assessment/Plan:   Administrations This Visit     certolizumab pegol (CIMZIA) kit 400 mg     Admin Date 03/07/2022 Action Given Dose 400 mg Route Subcutaneous Administered By Carole Binning, LPN             Patient tolerated injection well.   Appointment for next injection scheduled for April 11, 2022.  Patient due for labs in July 2023.  Patient is to call and  reschedule appointment if running a fever with signs/symptoms of infection, on antibiotics for active infection or has an upcoming invasive procedure.  All questions encouraged and answered.  Instructed patient to call with any further questions or concerns.

## 2022-04-03 ENCOUNTER — Encounter: Payer: Self-pay | Admitting: Certified Registered Nurse Anesthetist

## 2022-04-03 ENCOUNTER — Telehealth: Payer: Self-pay | Admitting: *Deleted

## 2022-04-03 NOTE — Telephone Encounter (Signed)
Patient contacted the office and states she has to cancel her Cimzia injection. Patient states she is having a root canal on 04/04/2022. Patient states she has to take antibiotics prior to appointment. Patient's appointment cancelled and she will call next week to reschedule.

## 2022-04-04 ENCOUNTER — Ambulatory Visit: Payer: 59

## 2022-04-10 ENCOUNTER — Encounter: Payer: 59 | Admitting: Gastroenterology

## 2022-04-17 ENCOUNTER — Ambulatory Visit
Admission: RE | Admit: 2022-04-17 | Discharge: 2022-04-17 | Disposition: A | Discharge status: Home / Self Care | Payer: 59 | Source: Ambulatory Visit | Attending: Physician Assistant | Admitting: Physician Assistant

## 2022-04-17 VITALS — BP 172/96 | HR 73 | Temp 98.2°F | Resp 18

## 2022-04-17 DIAGNOSIS — J0110 Acute frontal sinusitis, unspecified: Secondary | ICD-10-CM | POA: Diagnosis not present

## 2022-04-17 DIAGNOSIS — J069 Acute upper respiratory infection, unspecified: Secondary | ICD-10-CM | POA: Diagnosis not present

## 2022-04-17 MED ORDER — PROMETHAZINE-DM 6.25-15 MG/5ML PO SYRP: 5.0000 mL | ORAL_SOLUTION | Freq: Four times a day (QID) | ORAL | 0 refills | Status: AC | PRN

## 2022-04-17 MED ORDER — DOXYCYCLINE HYCLATE 100 MG PO CAPS: 100.0000 mg | ORAL_CAPSULE | Freq: Two times a day (BID) | ORAL | 0 refills | Status: DC

## 2022-04-17 NOTE — ED Provider Notes (Signed)
[name]    CSN: [id number] Arrival date & time: 04/17/22  1038      History   Chief Complaint Chief Complaint  Patient presents with   Cough    I'm also having the worst headache/migraine. I started feeling bad Saturday through the night. - Entered by patient   Headache    HPI Jennifer [name] is a 46 y.o. female.   Patient here today for evaluation of frontal headache, sinus pressure, sneezing and sore throat that started 5 days ago.  She reports that she has had some cough as well.  She has tried taking over-the-counter cold and flu medication without significant relief.  She did have fever yesterday with Tmax of 99.8 Fahrenheit.  She has not had any nausea or vomiting.  She denies any diarrhea.  The history is provided by the patient.  Cough Associated symptoms: ear pain, fever, headaches and sore throat   Associated symptoms: no eye discharge, no shortness of breath and no wheezing   Headache Associated symptoms: congestion, cough, ear pain, fever, sinus pressure and sore throat   Associated symptoms: no abdominal pain, no diarrhea, no nausea and no vomiting     Past Medical History:  Diagnosis Date   Acute otitis media 08/28/2012   improved  change to liquid medication    Breast abscess of female    Recurrent   Family history of adverse reaction to anesthesia    father hard time waking once (12/20/2016)   Fibroids    w/bleeding   GERD (gastroesophageal reflux disease)    History of blood transfusion    after surgery   Hypertension    Migraines    Dr. Orie Rout; "I have a few/month" (12/20/2016)   Osteoarthritis    Pill esophagitis 08/28/2012   amoxicillin  by hx  disc plan nl voice except hoarse not drooling  close fu  if not getting better with plan stop aleve  liquid ibu onlyf necessary    Recurrent periodic urticaria    Rheumatoid arthritis ([place])    "55% of my body" (12/20/2016)   Rheumatoid arthritis ([place])    Sleep apnea    wears cpap     Patient Active Problem List   Diagnosis Date Noted   OSA (obstructive sleep apnea) 02/23/2022   Morbid obesity ([place]) 02/23/2022   Primary osteoarthritis of right knee 03/28/2021   OA (osteoarthritis) of knee 04/02/2020   Pain in right knee 01/13/2020   Chronic pain of right knee 01/21/2019   S/P right knee arthroscopy 07/23/2018   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   Primary osteoarthritis of both hands 02/04/2018   Sleep apnea, obstructive 09/28/2017   Primary osteoarthritis of both knees 05/22/2017   Incisional hernia 12/18/2016   Leiomyoma of uterus 09/06/2016   Fibroid, uterine    Essential hypertension 05/25/2015   Recurrent headache 05/25/2015   Head lump 07/31/2014   Edema 12/29/2013   Baker's cyst, ruptured 12/25/2013   Cough, persistent 12/12/2013   Left leg swelling 12/12/2013   Cough 12/12/2013   High risk medication use 12/12/2013   Recurrent periodic urticaria    Rheumatoid arthritis ([place]) 08/28/2012   CHRONIC LARYNGITIS 11/03/2010   ALLERGIC RHINITIS 11/03/2010   PARESTHESIA 07/28/2010   DYSMENORRHEA 10/15/2007   FIBROIDS, UTERUS 07/24/2007   OBESITY 07/24/2007   SLEEPLESSNESS 07/24/2007   HEADACHE 07/24/2007    Past Surgical History:  Procedure Laterality Date   BREAST SURGERY     abcess under left breast    CYST  EXCISION Left 2014   "elbow"   HERNIA REPAIR     INCISION AND DRAINAGE BREAST ABSCESS Left 2003   INCISIONAL HERNIA REPAIR N/A 12/18/2016   Procedure: REPAIR INCISIONAL HERNIA WITH MESH;  Surgeon: Georganna Skeans, MD;  Location: [facility];  Service: General;  Laterality: N/A;   INSERTION OF MESH N/A 12/18/2016   Procedure: INSERTION OF MESH;  Surgeon: Georganna Skeans, MD;  Location: [place] [facility];  Service: General;  Laterality: N/A;   KNEE ARTHROSCOPY WITH MENISCAL REPAIR Right 06/17/2018   Procedure: RIGHT KNEE ARTHROSCOPY WITH MENISCAL REPAIR;  Surgeon: Vickey Huger, MD;  Location: [facility];  Service: Orthopedics;  Laterality: Right;   MYOMECTOMY N/A  09/06/2016   Procedure: Lendell Caprice;  Surgeon: Governor Specking, MD;  Location: [facility];  Service: Gynecology;  Laterality: N/A;   TOTAL KNEE ARTHROPLASTY Right 03/28/2021   Procedure: TOTAL KNEE ARTHROPLASTY;  Surgeon: Gaynelle Arabian, MD;  Location: [facility];  Service: Orthopedics;  Laterality: Right;  59mn   UTERINE FIBROID SURGERY     35 fibroids    OB History     Gravida  1   Para  1   Term      Preterm      AB      Living         SAB      IAB      Ectopic      Multiple      Live Births               Home Medications    Prior to Admission medications   Medication Sig Start Date End Date Taking? Authorizing Provider  doxycycline (VIBRAMYCIN) 100 MG capsule Take 1 capsule (100 mg total) by mouth 2 (two) times daily. 04/17/22  Yes MFrancene Finders PA-C  promethazine-dextromethorphan (PROMETHAZINE-DM) 6.25-15 MG/5ML syrup Take 5 mLs by mouth 4 (four) times daily as needed for up to 5 days for cough. 04/17/22 04/22/22 Yes MFrancene Finders PA-C  amLODipine (NORVASC) 2.5 MG tablet Take 1 tablet (2.5 mg total) by mouth daily. In addition to 5 mg amlodipine ( total 7.5 mg per day) 11/07/21   Panosh, WStandley Brooking MD  amLODipine (NORVASC) 5 MG tablet TAKE 1 TABLET BY MOUTH EVERY DAY 02/24/19   Panosh, WStandley Brooking MD  certolizumab pegol (CIMZIA) 2 X 200 MG KIT Inject 400 mg into the skin every 28 (twenty-eight) days.    [provider]  cetirizine (ZYRTEC) 10 MG tablet Take 10 mg by mouth daily as needed for allergies.    [provider]  folic acid (FOLVITE) 1 MG tablet Take 2 tablets by mouth daily. 03/06/22   DBo Merino MD  gabapentin (NEURONTIN) 300 MG capsule Take a 300 mg capsule three times a day for two weeks following surgery.Then take a 300 mg capsule two times a day for two weeks. Then take a 300 mg capsule once a day for two weeks. Then discontinue. Patient taking differently: as needed. Take a 300 mg capsule three times a day for two weeks  following surgery.Then take a 300 mg capsule two times a day for two weeks. Then take a 300 mg capsule once a day for two weeks. Then discontinue. 03/29/21   CFenton FoyD, PA-C  ibuprofen (ADVIL) 800 MG tablet Take 1 tablet (800 mg total) by mouth every 8 (eight) hours as needed (pain). 01/27/22   BBarrett Henle MD  Levonorgestrel ([facility] 19.5 MG IUD 19.5 mg by Intrauterine route once.  [provider]  methocarbamol (ROBAXIN) 500 MG tablet Take 1 tablet (500 mg total) by mouth every 8 (eight) hours as needed for muscle spasms. 01/27/22   Barrett Henle, MD  Methotrexate, PF, (RASUVO) 15 MG/0.3ML SOAJ Inject 15 mg into the skin once a week 03/06/22   Bo Merino, MD  NON FORMULARY Pt uses cpap nightly    [provider]  nystatin-triamcinolone ointment (MYCOLOG) Apply 1 application topically 2 (two) times daily. 10/17/21   Trula Slade, DPM  prednisoLONE acetate (PRED FORTE) 1 % ophthalmic suspension Place 1 drop into the left eye 2 (two) times daily.    [provider]  ramelteon (ROZEREM) 8 MG tablet TAKE 1 TABLET BY MOUTH AT BEDTIME. Patient taking differently: at bedtime as needed. 12/01/21   Panosh, Standley Brooking, MD  triamcinolone (NASACORT) 55 MCG/ACT AERO nasal inhaler Nasal Allergy 55 mcg spray aerosol  INHALE 2 SPRAYS IN EACH NOSTRIL AT NIGHT    [provider]  eletriptan (RELPAX) 40 MG tablet Take 1 tablet (40 mg total) by mouth as needed for migraine or headache. May repeat in 2 hours if headache persists or recurs. 10/02/19 11/29/20  Panosh, Standley Brooking, MD    Family History Family History  Problem Relation Age of Onset   Hypertension Mother    Rheum arthritis Mother    Hypertension Father    Sleep apnea Father     Social History Social History   Tobacco Use   Smoking status: Never    Passive exposure: Never   Smokeless tobacco: Never  Vaping Use   Vaping Use: Never used  Substance Use Topics   Alcohol use: Yes     Comment: social   Drug use: Never     Allergies   Lisinopril and Lisinopril-hydrochlorothiazide   Review of Systems Review of Systems  Constitutional:  Positive for fever.  HENT:  Positive for congestion, ear pain, sinus pressure and sore throat.   Eyes:  Negative for discharge and redness.  Respiratory:  Positive for cough. Negative for shortness of breath and wheezing.   Gastrointestinal:  Negative for abdominal pain, diarrhea, nausea and vomiting.  Neurological:  Positive for headaches.     Physical Exam Triage Vital Signs ED Triage Vitals  Enc Vitals Group     BP 04/17/22 1051 (!) 172/96     Pulse Rate 04/17/22 1051 73     Resp 04/17/22 1051 18     Temp 04/17/22 1051 98.2 F (36.8 C)     Temp src --      SpO2 04/17/22 1051 96 %     Weight --      Height --      Head Circumference --      Peak Flow --      Pain Score 04/17/22 1050 7     Pain Loc --      Pain Edu? --      Excl. in [place]? --    No data found.  Updated Vital Signs BP (!) 172/96   Pulse 73   Temp 98.2 F (36.8 C)   Resp 18   SpO2 96%      Physical Exam Vitals and nursing note reviewed.  Constitutional:      General: She is not in acute distress.    Appearance: Normal appearance. She is not ill-appearing.  HENT:     Head: Normocephalic and atraumatic.     Nose: Congestion present.     Mouth/Throat:  Mouth: Mucous membranes are moist.     Pharynx: No oropharyngeal exudate or posterior oropharyngeal erythema.  Eyes:     Conjunctiva/sclera: Conjunctivae normal.  Cardiovascular:     Rate and Rhythm: Normal rate and regular rhythm.     Heart sounds: Normal heart sounds. No murmur heard. Pulmonary:     Effort: Pulmonary effort is normal. No respiratory distress.     Breath sounds: Normal breath sounds. No wheezing, rhonchi or rales.  Skin:    General: Skin is warm and dry.  Neurological:     Mental Status: She is alert.  Psychiatric:        Mood and Affect: Mood normal.         Thought Content: Thought content normal.      UC Treatments / Results  Labs (all labs ordered are listed, but only abnormal results are displayed) Labs Reviewed  NOVEL CORONAVIRUS, NAA    EKG   Radiology No results found.  Procedures Procedures (including critical care time)  Medications Ordered in UC Medications - No data to display  Initial Impression / Assessment and Plan / UC Course  I have reviewed the triage vital signs and the nursing notes.  Pertinent labs & imaging results that were available during my care of the patient were reviewed by me and considered in my medical decision making (see chart for details).    We will treat to cover sinusitis with doxycycline.  Cough syrup prescribed as requested per patient.  Will order COVID screening.  Encouraged follow-up if symptoms fail to improve or worsen.  Final Clinical Impressions(s) / UC Diagnoses   Final diagnoses:  Acute upper respiratory infection  Acute frontal sinusitis, recurrence not specified   Discharge Instructions   None    ED Prescriptions     Medication Sig Dispense Auth. Provider   promethazine-dextromethorphan (PROMETHAZINE-DM) 6.25-15 MG/5ML syrup Take 5 mLs by mouth 4 (four) times daily as needed for up to 5 days for cough. 118 mL Ewell Poe F, PA-C   doxycycline (VIBRAMYCIN) 100 MG capsule Take 1 capsule (100 mg total) by mouth 2 (two) times daily. 20 capsule Francene Finders, PA-C      PDMP not reviewed this encounter.   Francene Finders, PA-C 04/17/22 1153

## 2022-04-17 NOTE — ED Triage Notes (Signed)
Pt presents to uc with co of ha, sinus pressure, sneezing, and sore throat since Thursday. Pt reports otc cold and flu medications for symptoms.

## 2022-04-18 LAB — NOVEL CORONAVIRUS, NAA: SARS-CoV-2, NAA: NOT DETECTED

## 2022-04-19 ENCOUNTER — Other Ambulatory Visit (HOSPITAL_COMMUNITY): Payer: Self-pay

## 2022-04-25 NOTE — Progress Notes (Deleted)
Office Visit Note  Patient: Jennifer [name]             Date of Birth: 1976-08-21           MRN: [id number]             PCP: Burnis Medin, MD Referring: Burnis Medin, MD Visit Date: 05/09/2022 Occupation: '@GUAROCC'$ @  Subjective:  No chief complaint on file.   History of Present Illness: Jennifer [name] is a 45 y.o. female ***   Activities of Daily Living:  Patient reports morning stiffness for *** {minute/hour:19697}.   Patient {ACTIONS;DENIES/REPORTS:21021675::"Denies"} nocturnal pain.  Difficulty dressing/grooming: {ACTIONS;DENIES/REPORTS:21021675::"Denies"} Difficulty climbing stairs: {ACTIONS;DENIES/REPORTS:21021675::"Denies"} Difficulty getting out of chair: {ACTIONS;DENIES/REPORTS:21021675::"Denies"} Difficulty using hands for taps, buttons, cutlery, and/or writing: {ACTIONS;DENIES/REPORTS:21021675::"Denies"}  No Rheumatology ROS completed.   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   Primary osteoarthritis of both hands 02/04/2018   Sleep apnea, obstructive 09/28/2017   Primary osteoarthritis of both knees 05/22/2017   Incisional hernia 12/18/2016   Leiomyoma of uterus 09/06/2016   Fibroid, uterine    Essential hypertension 05/25/2015   Recurrent headache 05/25/2015   Head lump 07/31/2014   Edema 12/29/2013   Baker's cyst, ruptured 12/25/2013   Cough, persistent 12/12/2013   Left leg swelling 12/12/2013   Cough 12/12/2013   High risk medication use 12/12/2013   Recurrent periodic urticaria    Rheumatoid arthritis ([place]) 08/28/2012   CHRONIC LARYNGITIS 11/03/2010   ALLERGIC RHINITIS 11/03/2010   PARESTHESIA 07/28/2010    DYSMENORRHEA 10/15/2007   FIBROIDS, UTERUS 07/24/2007   OBESITY 07/24/2007   SLEEPLESSNESS 07/24/2007   HEADACHE 07/24/2007    Past Medical History:  Diagnosis Date   Acute otitis media 08/28/2012   improved  change to liquid medication    Breast abscess of female    Recurrent   Family history of adverse reaction to anesthesia    father hard time waking once (12/20/2016)   Fibroids    w/bleeding   GERD (gastroesophageal reflux disease)    History of blood transfusion    after surgery   Hypertension    Migraines    Dr. Orie Rout; "I have a few/month" (12/20/2016)   Osteoarthritis    Pill esophagitis 08/28/2012   amoxicillin  by hx  disc plan nl voice except hoarse not drooling  close fu  if not getting better with plan stop aleve  liquid ibu onlyf necessary    Recurrent periodic urticaria    Rheumatoid arthritis ([place])    "55% of my body" (12/20/2016)   Rheumatoid arthritis ([place])    Sleep apnea    wears cpap    Family History  Problem Relation Age of Onset   Hypertension Mother    Rheum arthritis Mother    Hypertension Father    Sleep apnea Father    Past Surgical History:  Procedure Laterality Date   BREAST SURGERY     abcess under left breast    CYST EXCISION Left 2014   "elbow"   HERNIA REPAIR     INCISION AND DRAINAGE BREAST ABSCESS Left 2003   INCISIONAL HERNIA REPAIR N/A 12/18/2016   Procedure: REPAIR INCISIONAL  HERNIA WITH MESH;  Surgeon: Georganna Skeans, MD;  Location: [facility];  Service: General;  Laterality: N/A;   INSERTION OF MESH N/A 12/18/2016   Procedure: INSERTION OF MESH;  Surgeon: Georganna Skeans, MD;  Location: [facility];  Service: General;  Laterality: N/A;   KNEE ARTHROSCOPY WITH MENISCAL REPAIR Right 06/17/2018   Procedure: RIGHT KNEE ARTHROSCOPY WITH MENISCAL REPAIR;  Surgeon: Vickey Huger, MD;  Location[facility];  Service: Orthopedics;  Laterality: Right;   MYOMECTOMY N/A 09/06/2016   Procedure: Lendell Caprice;  Surgeon: Governor Specking, MD;   Location: [facility];  Service: Gynecology;  Laterality: N/A;   TOTAL KNEE ARTHROPLASTY Right 03/28/2021   Procedure: TOTAL KNEE ARTHROPLASTY;  Surgeon: Gaynelle Arabian, MD;  Location[facility];  Service: Orthopedics;  Laterality: Right;  60mn   UTERINE FIBROID SURGERY     35 fibroids   Social History   Social History Narrative   Not on file   Immunization History  Administered Date(s) Administered   Influenza Split 08/21/2012   Influenza,inj,Quad PF,6+ Mos 07/31/2014, 08/19/2019   Pneumococcal Conjugate-13 08/28/2012     Objective: Vital Signs: There were no vitals taken for this visit.   Physical Exam   Musculoskeletal Exam: ***  CDAI Exam: CDAI Score: -- Patient Global: --; Provider Global: -- Swollen: --; Tender: -- Joint Exam 05/09/2022   No joint exam has been documented for this visit   There is currently no information documented on the homunculus. Go to the Rheumatology activity and complete the homunculus joint exam.  Investigation: No additional findings.  Imaging: No results found.  Recent Labs: Lab Results  Component Value Date   WBC 5.1 01/05/2022   HGB 12.4 01/05/2022   PLT 235 01/05/2022   NA 142 01/05/2022   K 4.4 01/05/2022   CL 107 01/05/2022   CO2 28 01/05/2022   GLUCOSE 112 (H) 01/05/2022   BUN 14 01/05/2022   CREATININE 0.89 01/05/2022   BILITOT 0.4 01/05/2022   ALKPHOS 67 03/16/2021   AST 12 01/05/2022   ALT 9 01/05/2022   PROT 7.8 01/05/2022   ALBUMIN 3.8 03/16/2021   CALCIUM 9.0 01/05/2022   GFRAA 84 07/02/2020   QFTBGOLDPLUS NEGATIVE 03/23/2021    Speciality Comments: Prior therapy: Plaquenil (inadequate response)    Patient is needs to be seen in the office every 3 months to get Cimzia injections.  Procedures:  No procedures performed Allergies: Lisinopril and Lisinopril-hydrochlorothiazide   Assessment / Plan:     Visit Diagnoses: No diagnosis found.  Orders: No orders of the defined types were placed in this  encounter.  No orders of the defined types were placed in this encounter.   Face-to-face time spent with patient was *** minutes. Greater than 50% of time was spent in counseling and coordination of care.  Follow-Up Instructions: No follow-ups on file.   MEarnestine Mealing CMA  Note - This record has been created using DEditor, commissioning  Chart creation errors have been sought, but may not always  have been located. Such creation errors do not reflect on  the standard of medical care.

## 2022-05-09 ENCOUNTER — Ambulatory Visit: Payer: 59 | Attending: Rheumatology | Admitting: Rheumatology

## 2022-05-09 DIAGNOSIS — Z79899 Other long term (current) drug therapy: Secondary | ICD-10-CM

## 2022-05-09 DIAGNOSIS — M0579 Rheumatoid arthritis with rheumatoid factor of multiple sites without organ or systems involvement: Secondary | ICD-10-CM

## 2022-05-09 DIAGNOSIS — I10 Essential (primary) hypertension: Secondary | ICD-10-CM

## 2022-05-09 DIAGNOSIS — M25531 Pain in right wrist: Secondary | ICD-10-CM

## 2022-05-09 DIAGNOSIS — M19041 Primary osteoarthritis, right hand: Secondary | ICD-10-CM

## 2022-05-09 DIAGNOSIS — Z9889 Other specified postprocedural states: Secondary | ICD-10-CM

## 2022-05-09 DIAGNOSIS — M170 Bilateral primary osteoarthritis of knee: Secondary | ICD-10-CM

## 2022-05-09 DIAGNOSIS — H209 Unspecified iridocyclitis: Secondary | ICD-10-CM

## 2022-05-14 ENCOUNTER — Encounter (HOSPITAL_BASED_OUTPATIENT_CLINIC_OR_DEPARTMENT_OTHER): Payer: Self-pay | Admitting: Emergency Medicine

## 2022-05-14 ENCOUNTER — Emergency Department (HOSPITAL_BASED_OUTPATIENT_CLINIC_OR_DEPARTMENT_OTHER)
Admission: EM | Admit: 2022-05-14 | Discharge: 2022-05-14 | Disposition: A | Discharge status: Home / Self Care | Payer: 59 | Attending: Emergency Medicine | Admitting: Emergency Medicine

## 2022-05-14 ENCOUNTER — Other Ambulatory Visit: Payer: Self-pay

## 2022-05-14 DIAGNOSIS — R6884 Jaw pain: Secondary | ICD-10-CM | POA: Diagnosis present

## 2022-05-14 DIAGNOSIS — I10 Essential (primary) hypertension: Secondary | ICD-10-CM | POA: Diagnosis not present

## 2022-05-14 DIAGNOSIS — M542 Cervicalgia: Secondary | ICD-10-CM | POA: Insufficient documentation

## 2022-05-14 DIAGNOSIS — Z79899 Other long term (current) drug therapy: Secondary | ICD-10-CM | POA: Insufficient documentation

## 2022-05-14 MED ORDER — NAPROXEN 375 MG PO TABS: 375.0000 mg | ORAL_TABLET | Freq: Two times a day (BID) | ORAL | 0 refills | Status: DC

## 2022-05-14 MED ORDER — DIAZEPAM 5 MG PO TABS: 2.5000 mg | ORAL_TABLET | Freq: Every evening | ORAL | 0 refills | Status: DC | PRN

## 2022-05-14 MED ORDER — KETOROLAC TROMETHAMINE 60 MG/2ML IM SOLN
30.0000 mg | Freq: Once | INTRAMUSCULAR | Status: AC
  Administered 2022-05-14: 30 mg via INTRAMUSCULAR
  Filled 2022-05-14: qty 2

## 2022-05-14 NOTE — ED Triage Notes (Signed)
Pain in right side neck into ear, mild swelling. Pain x 4 day. Spoke with dental who denied it being denatl related and suggested being evaluated medically.  Took tylenol, ibuprofen, tramadol vicodin with no relief. (Not all together)   Last dose of pain medication tramadol at 22:00 tonight Pain is 8/10

## 2022-05-14 NOTE — ED Provider Notes (Signed)
[facility] EMERGENCY DEPT Provider Note  CSN: [id number] Arrival date & time: 05/14/22 0240  Chief Complaint(s) Otalgia and Neck Pain  HPI Jennifer [name] is a 46 y.o. female with a past medical history listed below including rheumatoid arthritis who presents to the emergency department with several days of right jaw pain radiating to the submandibular region, right ear and neck.  Pain is gradually worsened throughout the days.  Fluctuating in nature but mostly worse in the evenings.  Improves during the day.  Tender to palpation.  Worse when opening mouth patient has mild trismus. She has tried using mouth guard, but this makes it worse.  Patient had dental filler placed last week and followed up with dentistry who felt this was not dental related.  She denies any recent fevers or infections.  No coughing or congestion.  No sore throat.  No difficulty swallowing.  The history is provided by the patient.    Past Medical History Past Medical History:  Diagnosis Date   Acute otitis media 08/28/2012   improved  change to liquid medication    Breast abscess of female    Recurrent   Family history of adverse reaction to anesthesia    father hard time waking once (12/20/2016)   Fibroids    w/bleeding   GERD (gastroesophageal reflux disease)    History of blood transfusion    after surgery   Hypertension    Migraines    Dr. Orie Rout; "I have a few/month" (12/20/2016)   Osteoarthritis    Pill esophagitis 08/28/2012   amoxicillin  by hx  disc plan nl voice except hoarse not drooling  close fu  if not getting better with plan stop aleve  liquid ibu onlyf necessary    Recurrent periodic urticaria    Rheumatoid arthritis ([place])    "55% of my body" (12/20/2016)   Rheumatoid arthritis ([place])    Sleep apnea    wears cpap   Patient Active Problem List   Diagnosis Date Noted   OSA (obstructive sleep apnea) 02/23/2022   Morbid obesity (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   Primary osteoarthritis of both hands 02/04/2018   Sleep apnea, obstructive 09/28/2017   Primary osteoarthritis of both knees 05/22/2017   Incisional hernia 12/18/2016   Leiomyoma of uterus 09/06/2016   Fibroid, uterine    Essential hypertension 05/25/2015   Recurrent headache 05/25/2015   Head lump 07/31/2014   Edema 12/29/2013   Baker's cyst, ruptured 12/25/2013   Cough, persistent 12/12/2013   Left leg swelling 12/12/2013   Cough 12/12/2013   High risk medication use 12/12/2013   Recurrent periodic urticaria    Rheumatoid arthritis ([place]) 08/28/2012   CHRONIC LARYNGITIS 11/03/2010   ALLERGIC RHINITIS 11/03/2010   PARESTHESIA 07/28/2010   DYSMENORRHEA 10/15/2007   FIBROIDS, UTERUS 07/24/2007   OBESITY 07/24/2007   SLEEPLESSNESS 07/24/2007   HEADACHE 07/24/2007   Home Medication(s) Prior to Admission medications   Medication Sig Start Date End Date Taking? Authorizing Provider  diazepam (VALIUM) 5 MG tablet Take 0.5-1 tablets (2.5-5 mg total) by mouth at bedtime as needed (TMJ). 05/14/22  Yes Erek Kowal, Grayce Sessions, MD  naproxen (NAPROSYN) 375 MG tablet Take 1 tablet (375 mg total) by mouth 2 (two) times daily. 05/14/22  Yes Dev Dhondt,  Grayce Sessions, MD  amLODipine (NORVASC) 2.5 MG tablet Take 1 tablet (2.5 mg total) by mouth daily. In addition to 5 mg amlodipine ( total 7.5 mg per day) 11/07/21   Panosh, Standley Brooking, MD  amLODipine (NORVASC) 5 MG tablet TAKE 1 TABLET BY MOUTH EVERY DAY 02/24/19   Panosh, Standley Brooking, MD  certolizumab pegol (CIMZIA) 2 X 200 MG KIT Inject 400 mg into the skin every 28 (twenty-eight) days.    [provider]  cetirizine (ZYRTEC) 10 MG tablet Take 10 mg by mouth daily as needed for allergies.    [provider]   doxycycline (VIBRAMYCIN) 100 MG capsule Take 1 capsule (100 mg total) by mouth 2 (two) times daily. 04/17/22   Francene Finders, PA-C  folic acid (FOLVITE) 1 MG tablet Take 2 tablets by mouth daily. 03/06/22   Bo Merino, MD  gabapentin (NEURONTIN) 300 MG capsule Take a 300 mg capsule three times a day for two weeks following surgery.Then take a 300 mg capsule two times a day for two weeks. Then take a 300 mg capsule once a day for two weeks. Then discontinue. Patient taking differently: as needed. Take a 300 mg capsule three times a day for two weeks following surgery.Then take a 300 mg capsule two times a day for two weeks. Then take a 300 mg capsule once a day for two weeks. Then discontinue. 03/29/21   Fenton Foy D, PA-C  ibuprofen (ADVIL) 800 MG tablet Take 1 tablet (800 mg total) by mouth every 8 (eight) hours as needed (pain). 01/27/22   Barrett Henle, MD  Levonorgestrel [facility]) 19.5 MG IUD 19.5 mg by Intrauterine route once.    [provider]  methocarbamol (ROBAXIN) 500 MG tablet Take 1 tablet (500 mg total) by mouth every 8 (eight) hours as needed for muscle spasms. 01/27/22   Barrett Henle, MD  NON FORMULARY Pt uses cpap nightly    [provider]  nystatin-triamcinolone ointment (MYCOLOG) Apply 1 application topically 2 (two) times daily. 10/17/21   Trula Slade, DPM  prednisoLONE acetate (PRED FORTE) 1 % ophthalmic suspension Place 1 drop into the left eye 2 (two) times daily.    [provider]  ramelteon (ROZEREM) 8 MG tablet TAKE 1 TABLET BY MOUTH AT BEDTIME. Patient taking differently: at bedtime as needed. 12/01/21   Panosh, Standley Brooking, MD  triamcinolone (NASACORT) 55 MCG/ACT AERO nasal inhaler Nasal Allergy 55 mcg spray aerosol  INHALE 2 SPRAYS IN EACH NOSTRIL AT NIGHT    [provider]  eletriptan (RELPAX) 40 MG tablet Take 1 tablet (40 mg total) by mouth as needed for migraine or headache. May repeat in 2 hours if headache  persists or recurs. 10/02/19 11/29/20  Panosh, Standley Brooking, MD  Allergies Lisinopril and Lisinopril-hydrochlorothiazide  Review of Systems Review of Systems As noted in HPI  Physical Exam Vital Signs  I have reviewed the triage vital signs BP (!) 173/108   Pulse 70   Temp 98 F (36.7 C) (Oral)   Resp 20   SpO2 100%   Physical Exam Vitals reviewed.  Constitutional:      General: She is not in acute distress.    Appearance: She is well-developed. She is not diaphoretic.  HENT:     Head: Normocephalic and atraumatic. No masses.     Jaw: Trismus (mild), tenderness and pain on movement present. No swelling or malocclusion.     Salivary Glands: Right salivary gland is tender. Right salivary gland is not diffusely enlarged. Left salivary gland is not diffusely enlarged or tender.     Right Ear: Tympanic membrane and external ear normal.     Left Ear: Tympanic membrane and external ear normal.     Nose: Nose normal.     Mouth/Throat:     Lips: No lesions.     Mouth: No oral lesions or angioedema.     Dentition: No dental tenderness or dental abscesses.     Tongue: No lesions.     Palate: No mass and lesions.     Pharynx: No pharyngeal swelling, oropharyngeal exudate, posterior oropharyngeal erythema or uvula swelling.     Tonsils: No tonsillar exudate or tonsillar abscesses. 2+ on the right. 1+ on the left.  Eyes:     General: No scleral icterus.    Conjunctiva/sclera: Conjunctivae normal.  Neck:     Thyroid: No thyroid mass or thyroid tenderness.     Trachea: Phonation normal.  Cardiovascular:     Rate and Rhythm: Normal rate and regular rhythm.  Pulmonary:     Effort: Pulmonary effort is normal. No respiratory distress.     Breath sounds: No stridor.  Abdominal:     General: There is no distension.  Musculoskeletal:        General: Normal range of  motion.     Cervical back: Normal range of motion. No rigidity. No pain with movement, spinous process tenderness or muscular tenderness. Normal range of motion.  Lymphadenopathy:     Cervical: No cervical adenopathy.  Neurological:     Mental Status: She is alert and oriented to person, place, and time.  Psychiatric:        Behavior: Behavior normal.     ED Results and Treatments Labs (all labs ordered are listed, but only abnormal results are displayed) Labs Reviewed - No data to display                                                                                                                       EKG  EKG Interpretation  Date/Time:    Ventricular Rate:    PR Interval:    QRS Duration:   QT Interval:    QTC Calculation:   R Axis:  Text Interpretation:         Radiology No results found.  Medications Ordered in ED Medications  ketorolac (TORADOL) injection 30 mg (30 mg Intramuscular Given 05/14/22 6147)                                                                                                                                     Procedures Procedures  (including critical care time)  Medical Decision Making / ED Course   Medical Decision Making Risk Prescription drug management.    Jaw pain Favoring TMJ vs muscle strain of the intrinsic neck musculature. No evidence of mass, inflammatory or infectious process. Doubt cardiac or vascular process. No need for imaging at this time. Additional therapeutic management recommended. Close PCP follow up if no improvement      Final Clinical Impression(s) / ED Diagnoses Final diagnoses:  Jaw pain   The patient appears reasonably screened and/or stabilized for discharge and I doubt any other medical condition or other [facility] requiring further screening, evaluation, or treatment in the ED at this time. I have discussed the findings, Dx and Tx plan with the patient/family who expressed understanding and  agree(s) with the plan. Discharge instructions discussed at length. The patient/family was given strict return precautions who verbalized understanding of the instructions. No further questions at time of discharge.  Disposition: Discharge  Condition: Good  ED Discharge Orders          Ordered    naproxen (NAPROSYN) 375 MG tablet  2 times daily        05/14/22 0714    diazepam (VALIUM) 5 MG tablet  At bedtime PRN        05/14/22 [place] narcotic database reviewed and no active prescriptions noted.   Follow Up: Burnis Medin, [address] [place] [address] [phone]  Call  to schedule an appointment for close follow up            This chart was dictated using voice recognition software.  Despite best efforts to proofread,  errors can occur which can change the documentation meaning.    Fatima Blank, MD 05/14/22 [phone]

## 2022-05-14 NOTE — ED Notes (Signed)
Dc instructions reviewed with patient. Patient voiced understanding. Dc with belongings.  °

## 2022-05-18 NOTE — Progress Notes (Signed)
NEUROLOGY FOLLOW UP OFFICE NOTE  Jennifer [name] [id number]  Assessment/Plan:   Migraine without aura, without status migrainosus, not intractable Hypertension  Migraine prevention:  Start Ajovy every 28 days.  Would not use Aimovig as she already has hypertension Migraine rescue:  Tosymra NS - previously effective.  Already tried rizatriptan and eletriptan.  Generic sumatriptan NS not effective.   Limit use of pain relievers to no more than 2 days out of week to prevent risk of rebound or medication-overuse headache. Keep headache diary Follow up with PCP regarding blood pressure Follow up 4 months.  Subjective:  Jennifer [name] is a 46 year old female with rheumatoid arthritis who follows up for migraines.  UPDATE: Last seen on 11/10/2019.  At that time, I had her try sample of Tosymra.  It was helpful. Reports increased stress since last visit.  Also arthritic pain aggravates it.   Intensity:  severe Duration:  2-3 hours with rizatriptan Frequency:  3 days a week Current NSAIDS:  Naproxen 548m Current analgesics:  Excedrin Migraine Current triptans:  rizatriptan Current ergotamine:  none Current anti-emetic:  none Current muscle relaxants:  Robaxin Current anti-anxiolytic:  none Current sleep aide:  Melatonin 147mPRN Current Antihypertensive medications:  amlodipine Current Antidepressant medications:  none Current Anticonvulsant medications:  gabapentin 30045mID PRN Current anti-CGRP:  none Current Vitamins/Herbal/Supplements:  Folic acid; melatonin 65m80DXN Current Antihistamines/Decongestants:  Zyrtec Other therapy:  none Hormone/birth control:  Kyleena Other medications:  methotrexate  Caffeine:  1 to 2 cups of coffee daily.  Sometimes at 4 PM.  No soda or tea Diet:  No soda.  Four 16 oz bottles of water daily.   Depression:  no; Anxiety:  Yes (family-related Other pain:  arthritic pain in her other knee Sleep hygiene:  Poor.  She was diagnosed with OSA and  uses a CPAP.  However, she can't turn off her mind.  4 to 6 hours of sleep a night.  Takes melatonin  HISTORY:  She started having migraines in her late-20s to early-30s.  They are severe mid-frontal/bitemporal/back of neck throbbing pain.  They are associated with seeing white spots when severe, photophobia, phonophobia, rarely nausea..  Her migraines typically last 2 hours to 2 days and occur 2 to 3 times a month.  Triggers include stress and fluorescent lights.     She developed a migraine on 09/18/2019, however it was persistent.  She was subsequently diagnosed with Covid a few days later.  The migraine did not break.  She was prescribed a prednisone taper on 10/08/2019 and the migraine broke.  She currently reports increased family-related stress.        Past NSAIDS/ steroids:  Ibuprofen 800m50mrednisone Past analgesics:  Tramadol Past abortive triptans: Maxalt MLT 65mg21mmatriptan 20mg 38mTosymra NS (helpful), eletriptan Past abortive ergotamine:  none Past muscle relaxants:  Flexeril Past anti-emetic:  none Past antihypertensive medications:  Labetolol; lisinopril-HCTZ Past antidepressant medications:  Doxepin 65mg B73mhives); fluoxetine 65mg Pa61mnticonvulsant medications:  zonisamide 100mg dai33mtopiramate (stopped due to potential memory problems), gabapentin Past anti-CGRP:  none Past vitamins/Herbal/Supplements:  none Past antihistamines/decongestants:  none Other past therapies:  none    Family history of headache:  Aunt (used to have migraines)  PAST MEDICAL HISTORY: Past Medical History:  Diagnosis Date   Acute otitis media 08/28/2012   improved  change to liquid medication    Breast abscess of female    Recurrent   Family history of adverse reaction to  anesthesia    father hard time waking once (12/20/2016)   Fibroids    w/bleeding   GERD (gastroesophageal reflux disease)    History of blood transfusion    after surgery   Hypertension    Migraines    Dr.  Orie Rout; "I have a few/month" (12/20/2016)   Osteoarthritis    Pill esophagitis 08/28/2012   amoxicillin  by hx  disc plan nl voice except hoarse not drooling  close fu  if not getting better with plan stop aleve  liquid ibu onlyf necessary    Recurrent periodic urticaria    Rheumatoid arthritis ([place])    "55% of my body" (12/20/2016)   Rheumatoid arthritis ([place])    Sleep apnea    wears cpap    MEDICATIONS: Current Outpatient Medications on File Prior to Visit  Medication Sig Dispense Refill   albuterol (VENTOLIN HFA) 108 (90 Base) MCG/ACT inhaler TAKE 2 PUFFS BY MOUTH EVERY 6 HOURS AS NEEDED FOR WHEEZE OR SHORTNESS OF BREATH     amLODipine (NORVASC) 2.5 MG tablet Take 1 tablet (2.5 mg total) by mouth daily. In addition to 5 mg amlodipine ( total 7.5 mg per day) 90 tablet 1   certolizumab pegol (CIMZIA) 2 X 200 MG KIT Inject 400 mg into the skin every 28 (twenty-eight) days.     cetirizine (ZYRTEC) 10 MG tablet Take 10 mg by mouth daily as needed for allergies.     diazepam (VALIUM) 5 MG tablet Take 0.5-1 tablets (2.5-5 mg total) by mouth at bedtime as needed (TMJ). 10 tablet 0   ibuprofen (ADVIL) 800 MG tablet Take 1 tablet (800 mg total) by mouth every 8 (eight) hours as needed (pain). 21 tablet 0   Levonorgestrel (KYLEENA) 19.5 MG IUD 19.5 mg by Intrauterine route once.     methocarbamol (ROBAXIN) 500 MG tablet Take 1 tablet (500 mg total) by mouth every 8 (eight) hours as needed for muscle spasms. 20 tablet 0   methylPREDNISolone (MEDROL DOSEPAK) 4 MG TBPK tablet Take by mouth as directed.     naproxen (NAPROSYN) 375 MG tablet Take 1 tablet (375 mg total) by mouth 2 (two) times daily. 20 tablet 0   NON FORMULARY Pt uses cpap nightly     nystatin-triamcinolone ointment (MYCOLOG) Apply 1 application topically 2 (two) times daily. 30 g 0   prednisoLONE acetate (PRED FORTE) 1 % ophthalmic suspension Place 1 drop into the left eye 2 (two) times daily. As needed     ramelteon (ROZEREM)  8 MG tablet TAKE 1 TABLET BY MOUTH AT BEDTIME. (Patient taking differently: at bedtime as needed.) 90 tablet 1   triamcinolone (NASACORT) 55 MCG/ACT AERO nasal inhaler Nasal Allergy 55 mcg spray aerosol  INHALE 2 SPRAYS IN EACH NOSTRIL AT NIGHT     [DISCONTINUED] eletriptan (RELPAX) 40 MG tablet Take 1 tablet (40 mg total) by mouth as needed for migraine or headache. May repeat in 2 hours if headache persists or recurs. 10 tablet 1   No current facility-administered medications on file prior to visit.     ALLERGIES: Allergies  Allergen Reactions   Lisinopril Anaphylaxis   Lisinopril-Hydrochlorothiazide Swelling and Other (See Comments)    Angioedema  Has tolerated maxide in past so most likely  The ACE inhibitor as the cause     FAMILY HISTORY: Family History  Problem Relation Age of Onset   Hypertension Mother    Rheum arthritis Mother    Hypertension Father    Sleep apnea Father  Objective:  Blood pressure (!) 180/79, pulse 60, height _0  (1.626 m), weight 244 lb 3.2 oz (110.8 kg), SpO2 95 %. General: No acute distress.  Patient appears well-groomed.   Head:  Normocephalic/atraumatic Eyes:  Fundi examined but not visualized Neck: supple, no paraspinal tenderness, full range of motion Heart:  Regular rate and rhythm Neurological Exam: alert and oriented to person, place, and time.  Speech fluent and not dysarthric, language intact.  CN II-XII intact. Bulk and tone normal, muscle strength 5/5 throughout.  Sensation to light touch intact.  Deep tendon reflexes 2+ throughout, toes downgoing.  Finger to nose testing intact.  Gait normal, Romberg negative.   Metta Clines, DO  CC: Shanon Ace, MD

## 2022-05-19 HISTORY — PX: ROOT CANAL: SHX2363

## 2022-05-23 ENCOUNTER — Ambulatory Visit (INDEPENDENT_AMBULATORY_CARE_PROVIDER_SITE_OTHER): Payer: 59 | Admitting: Neurology

## 2022-05-23 ENCOUNTER — Encounter: Payer: Self-pay | Admitting: Neurology

## 2022-05-23 VITALS — BP 180/79 | HR 60 | Ht 64.0 in | Wt 244.2 lb

## 2022-05-23 DIAGNOSIS — G43009 Migraine without aura, not intractable, without status migrainosus: Secondary | ICD-10-CM | POA: Diagnosis not present

## 2022-05-23 DIAGNOSIS — I10 Essential (primary) hypertension: Secondary | ICD-10-CM | POA: Diagnosis not present

## 2022-05-23 MED ORDER — AJOVY 225 MG/1.5ML ~~LOC~~ SOAJ: 225.0000 mg | SUBCUTANEOUS | 11 refills | Status: DC

## 2022-05-23 MED ORDER — TOSYMRA 10 MG/ACT NA SOLN: 10.0000 mg | Freq: Once | NASAL | 11 refills | Status: DC | PRN

## 2022-05-23 NOTE — Progress Notes (Signed)
Office Visit Note  Patient: Jennifer [name]             Date of Birth: [birth year]           MRN: [id number]             PCP: Burnis Medin, MD Referring: Burnis Medin, MD Visit Date: 06/06/2022 Occupation: _0 @  Subjective:  Medication management  History of Present Illness: Jennifer [name] is a 46 y.o. female history of seropositive rheumatoid arthritis.  She returns today after her last visit in May 2023.  She states she added the Rasuvo injections due to iritis which she took for  1-1/73-monthand then discontinued as she thought it was causing hair loss.  She had been taking Cimzia injections on a regular basis but did not get her September injection due to root canal.  She was treated with antibiotics and muscle relaxers.  He is cleared is now to restart Cimzia injections.  She is not experiencing any joint pain or joint swelling.  He has not had any recurrence of iritis since May 2023.  He states that she just returned from a vacation and is experiencing some achiness.  Activities of Daily Living:  Patient reports morning stiffness for 0 minutes.   Patient Denies nocturnal pain.  Difficulty dressing/grooming: Denies Difficulty climbing stairs: Reports Difficulty getting out of chair: Denies Difficulty using hands for taps, buttons, cutlery, and/or writing: Denies  Review of Systems  Constitutional:  Negative for fatigue.  HENT:  Positive for mouth dryness. Negative for mouth sores.   Eyes:  Negative for dryness.  Respiratory:  Negative for shortness of breath.   Cardiovascular:  Negative for chest pain and palpitations.  Gastrointestinal:  Negative for blood in stool, constipation and diarrhea.  Endocrine: Negative for increased urination.  Genitourinary:  Negative for involuntary urination.  Musculoskeletal:  Positive for joint pain and joint pain. Negative for gait problem, joint swelling, myalgias, muscle weakness, morning stiffness, muscle tenderness and myalgias.   Skin:  Negative for color change, rash, hair loss and sensitivity to sunlight.  Allergic/Immunologic: Negative for susceptible to infections.  Neurological:  Negative for dizziness and headaches.  Hematological:  Negative for swollen glands.  Psychiatric/Behavioral:  Negative for depressed mood and sleep disturbance. The patient is not nervous/anxious.     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   Primary osteoarthritis of both hands 02/04/2018   Sleep apnea, obstructive 09/28/2017   Primary osteoarthritis of both knees 05/22/2017   Incisional hernia 12/18/2016   Leiomyoma of uterus 09/06/2016   Fibroid, uterine    Essential hypertension 05/25/2015   Recurrent headache 05/25/2015   Head lump 07/31/2014   Edema 12/29/2013   Baker's cyst, ruptured 12/25/2013   Cough, persistent 12/12/2013   Left leg swelling 12/12/2013   Cough 12/12/2013   High risk medication use 12/12/2013   Recurrent periodic urticaria    Rheumatoid arthritis ([place] 08/28/2012   CHRONIC LARYNGITIS 11/03/2010   ALLERGIC RHINITIS 11/03/2010   PARESTHESIA 07/28/2010   DYSMENORRHEA 10/15/2007   FIBROIDS, UTERUS 07/24/2007   OBESITY 07/24/2007   SLEEPLESSNESS 07/24/2007   HEADACHE 07/24/2007  Past Medical History:  Diagnosis Date   Acute otitis media 08/28/2012   improved  change to liquid medication    Breast abscess of female    Recurrent   Family history of adverse reaction to anesthesia    father hard time waking once (12/20/2016)   Fibroids    w/bleeding   GERD (gastroesophageal reflux disease)    History of blood transfusion    after surgery   Hypertension     Migraines    Dr. Orie Rout; "I have a few/month" (12/20/2016)   Osteoarthritis    Pill esophagitis 08/28/2012   amoxicillin  by hx  disc plan nl voice except hoarse not drooling  close fu  if not getting better with plan stop aleve  liquid ibu onlyf necessary    Recurrent periodic urticaria    Rheumatoid arthritis ([place])    "55% of my body" (12/20/2016)   Rheumatoid arthritis ([place])    Sleep apnea    wears cpap    Family History  Problem Relation Age of Onset   Hypertension Mother    Rheum arthritis Mother    Hypertension Father    Sleep apnea Father    Past Surgical History:  Procedure Laterality Date   BREAST SURGERY     abcess under left breast    CYST EXCISION Left 2014   "elbow"   HERNIA REPAIR     INCISION AND DRAINAGE BREAST ABSCESS Left 2003   INCISIONAL HERNIA REPAIR N/A 12/18/2016   Procedure: REPAIR INCISIONAL HERNIA WITH MESH;  Surgeon: Georganna Skeans, MD;  Location: [facility];  Service: General;  Laterality: N/A;   INSERTION OF MESH N/A 12/18/2016   Procedure: INSERTION OF MESH;  Surgeon: Georganna Skeans, MD;  Location: [facility];  Service: General;  Laterality: N/A;   KNEE ARTHROSCOPY WITH MENISCAL REPAIR Right 06/17/2018   Procedure: RIGHT KNEE ARTHROSCOPY WITH MENISCAL REPAIR;  Surgeon: Vickey Huger, MD;  Location: [facility];  Service: Orthopedics;  Laterality: Right;   MYOMECTOMY N/A 09/06/2016   Procedure: Lendell Caprice;  Surgeon: Governor Specking, MD;  Location: [facility];  Service: Gynecology;  Laterality: N/A;   ROOT CANAL  05/2022   TOTAL KNEE ARTHROPLASTY Right 03/28/2021   Procedure: TOTAL KNEE ARTHROPLASTY;  Surgeon: Gaynelle Arabian, MD;  Location: [facility];  Service: Orthopedics;  Laterality: Right;  58mn   UTERINE FIBROID SURGERY     35 fibroids   Social History   Social History Narrative   Not on file   Immunization History  Administered Date(s) Administered   Influenza Split 08/21/2012   Influenza,inj,Quad PF,6+ Mos 07/31/2014, 08/19/2019    Pneumococcal Conjugate-13 08/28/2012     Objective: Vital Signs: BP (!) 144/92 (BP Location: Left Arm, Patient Position: Sitting, Cuff Size: Large)   Pulse 68   Resp 16   Ht _0  (1.626 m)   Wt 243 lb 3.2 oz (110.3 kg)   BMI 41.75 kg/m    Physical Exam Vitals and nursing note reviewed.  Constitutional:      Appearance: She is well-developed.  HENT:     Head: Normocephalic and atraumatic.  Eyes:     Conjunctiva/sclera: Conjunctivae normal.  Cardiovascular:     Rate and Rhythm: Normal rate and regular rhythm.     Heart sounds: Normal heart sounds.  Pulmonary:     Effort: Pulmonary effort is normal.     Breath sounds: Normal breath sounds.  Abdominal:     General: Bowel sounds are normal.     Palpations: Abdomen  is soft.  Musculoskeletal:     Cervical back: Normal range of motion.  Lymphadenopathy:     Cervical: No cervical adenopathy.  Skin:    General: Skin is warm and dry.     Capillary Refill: Capillary refill takes less than 2 seconds.  Neurological:     Mental Status: She is alert and oriented to person, place, and time.  Psychiatric:        Behavior: Behavior normal.      Musculoskeletal Exam: C-spine was in good range of motion.  Shoulder joints, elbow joints, wrist joints, MCPs PIPs and DIPs and good range of motion with no synovitis.  She had to right knee replacement which was in good range of motion.  She has some discomfort range of motion of her left knee joint without any warmth swelling or effusion.  There was no tenderness over ankles or MTPs.  CDAI Exam: CDAI Score: 2.6  Patient Global: 4 mm; Provider Global: 2 mm Swollen: 0 ; Tender: 2  Joint Exam 06/06/2022      Right  Left  Knee   Tender   Tender   There is currently no information documented on the homunculus. Go to the Rheumatology activity and complete the homunculus joint exam.  Investigation: No additional findings.  Imaging: No results found.  Recent Labs: Lab Results  Component  Value Date   WBC 5.1 01/05/2022   HGB 12.4 01/05/2022   PLT 235 01/05/2022   NA 142 01/05/2022   K 4.4 01/05/2022   CL 107 01/05/2022   CO2 28 01/05/2022   GLUCOSE 112 (H) 01/05/2022   BUN 14 01/05/2022   CREATININE 0.89 01/05/2022   BILITOT 0.4 01/05/2022   ALKPHOS 67 03/16/2021   AST 12 01/05/2022   ALT 9 01/05/2022   PROT 7.8 01/05/2022   ALBUMIN 3.8 03/16/2021   CALCIUM 9.0 01/05/2022   GFRAA 84 07/02/2020   QFTBGOLDPLUS NEGATIVE 03/23/2021    Speciality Comments: Prior therapy: Plaquenil (inadequate response)    Patient is needs to be seen in the office every 3 months to get Cimzia injections.  Procedures:  No procedures performed Allergies: Lisinopril and Lisinopril-hydrochlorothiazide   Assessment / Plan:     Visit Diagnoses: Rheumatoid arthritis involving multiple sites with positive rheumatoid factor (HCC) - +CCP, +HLA B27: Patient has been doing well on Cimzia 400 mg subcu monthly.  Her last injection was in June 2023 per chart review.  She states she could not get September injection due to root canal.  She has not experienced rheumatoid arthritis flare.  She has been doing well.  She has clearance to restart Cimzia.  Will restart Cimzia after the lab results are available.  She states she took Rasuvo subcutaneous injections for about 1-1/39-monthstarting June 2023 and then discontinued as she thought it was causing her loss.  She has not had a flare of iritis.  High risk medication use - Cimzia 400 mg sq injections in the office every 28 days, (Rasuvo 20 mg sq injections once weekly and folic acid 1 mg 2 tablets daily.-Patient discontinued due to hair loss)- Plan: CBC with Differential/Platelet, COMPLETE METABOLIC PANEL WITH GFR, QuantiFERON-TB Gold Plus today.  She was advised to get labs every 3 months to monitor for drug toxicity.  Last TB gold was negative on March 23, 2021.  Annual skin examination was advised while she is on Cimzia to screen for skin cancer.   Information on immunization was placed in the AVS.  She was advised  to hold Cimzia if she develops an infection and resume after the infection resolves.  Iritis-patient had a flare of iritis in May.  She restarted Rasuvo injections and then discontinued due to hair loss.  At this point she wants to hold off Rasuvo.  Pain in right wrist-improved  Primary osteoarthritis of both hands-she can use to have some stiffness.  No synovitis was noted.  Primary osteoarthritis of both knees-she continues to have pain and discomfort in her knee joints.  No warmth swelling or effusion was noted.  Patient plans to have left total knee replacement in the future.  S/P right knee arthroscopy-March 28, 2021 doing well.  Essential Hypertension-her blood pressure was elevated today.  She was advised to monitor blood pressure closely and follow-up with her PCP.  Increased risk of heart disease with rheumatoid arthritis was discussed.  Dietary modifications were discussed and a handout was placed in the AVS.  Orders: Orders Placed This Encounter  Procedures   CBC with Differential/Platelet   COMPLETE METABOLIC PANEL WITH GFR   QuantiFERON-TB Gold Plus   No orders of the defined types were placed in this encounter.    Follow-Up Instructions: Return in about 5 months (around 11/06/2022) for Rheumatoid arthritis, Osteoarthritis.   Bo Merino, MD  Note - This record has been created using Editor, commissioning.  Chart creation errors have been sought, but may not always  have been located. Such creation errors do not reflect on  the standard of medical care.

## 2022-05-23 NOTE — Patient Instructions (Signed)
  Start Ajovy injection every 28 days.   Take Tosymra nasal spray  Limit use of pain relievers to no more than 2 days out of the week.  These medications include acetaminophen, NSAIDs (ibuprofen/Advil/Motrin, naproxen/Aleve, triptans (Imitrex/sumatriptan), Excedrin, and narcotics.  This will help reduce risk of rebound headaches. Be aware of common food triggers:  - Caffeine:  coffee, black tea, cola, Mt. Dew  - Chocolate  - Dairy:  aged cheeses (brie, blue, cheddar, gouda, [place], provolone, [place], Swiss, etc), chocolate milk, buttermilk, sour cream, limit eggs and yogurt  - Nuts, peanut butter  - Alcohol  - Cereals/grains:  FRESH breads (fresh bagels, sourdough, doughnuts), yeast productions  - Processed/canned/aged/cured meats (pre-packaged deli meats, hotdogs)  - MSG/glutamate:  soy sauce, flavor enhancer, pickled/preserved/marinated foods  - Sweeteners:  aspartame (Equal, Nutrasweet).  Sugar and Splenda are okay  - Vegetables:  legumes (lima beans, lentils, snow peas, fava beans, pinto peans, peas, garbanzo beans), sauerkraut, onions, olives, pickles  - Fruit:  avocados, bananas, citrus fruit (orange, lemon, grapefruit), mango  - Other:  Frozen meals, macaroni and cheese Routine exercise Stay adequately hydrated (aim for 64 oz water daily) Keep headache diary Maintain proper stress management Maintain proper sleep hygiene Do not skip meals Consider supplements:  magnesium citrate '400mg'$  daily, riboflavin '400mg'$  daily, coenzyme Q10 '100mg'$  three times daily.

## 2022-05-24 ENCOUNTER — Telehealth: Payer: Self-pay | Admitting: *Deleted

## 2022-05-24 NOTE — Telephone Encounter (Signed)
Patient contacted the office and left message stating she would like to schedule her Cimzia injection.  Attempted to contact the patient. Unable to leave a message for patient, mailbox is full.

## 2022-06-06 ENCOUNTER — Ambulatory Visit: Payer: 59 | Attending: Rheumatology | Admitting: Rheumatology

## 2022-06-06 ENCOUNTER — Encounter: Payer: Self-pay | Admitting: Rheumatology

## 2022-06-06 VITALS — BP 144/92 | HR 68 | Resp 16 | Ht 64.0 in | Wt 243.2 lb

## 2022-06-06 DIAGNOSIS — M25531 Pain in right wrist: Secondary | ICD-10-CM

## 2022-06-06 DIAGNOSIS — H209 Unspecified iridocyclitis: Secondary | ICD-10-CM

## 2022-06-06 DIAGNOSIS — M170 Bilateral primary osteoarthritis of knee: Secondary | ICD-10-CM

## 2022-06-06 DIAGNOSIS — M19041 Primary osteoarthritis, right hand: Secondary | ICD-10-CM

## 2022-06-06 DIAGNOSIS — Z79899 Other long term (current) drug therapy: Secondary | ICD-10-CM

## 2022-06-06 DIAGNOSIS — M0579 Rheumatoid arthritis with rheumatoid factor of multiple sites without organ or systems involvement: Secondary | ICD-10-CM

## 2022-06-06 DIAGNOSIS — Z9889 Other specified postprocedural states: Secondary | ICD-10-CM

## 2022-06-06 DIAGNOSIS — M19042 Primary osteoarthritis, left hand: Secondary | ICD-10-CM

## 2022-06-06 DIAGNOSIS — I10 Essential (primary) hypertension: Secondary | ICD-10-CM

## 2022-06-06 NOTE — Patient Instructions (Signed)
Standing Labs We placed an order today for your standing lab work.   Please have your standing labs drawn in December and every 3 months  If possible, please have your labs drawn 2 weeks prior to your appointment so that the provider can discuss your results at your appointment.  Please note that you may see your imaging and lab results in [place] before we have reviewed them. We may be awaiting multiple results to interpret others before contacting you. Please allow our office up to 72 hours to thoroughly review all of the results before contacting the office for clarification of your results.  We currently have open lab daily: Monday through Thursday from 1:30 PM-4:30 PM and Friday from 1:30 PM- 4:00 PM If possible, please come for your lab work on Monday, Thursday or Friday afternoons, as you may experience shorter wait times.   Effective July 19, 2022 the new lab hours will change to: Monday through Thursday from 1:30 PM-5:00 PM and Friday from 8:30 AM-12:00 PM If possible, please come for your lab work on Monday and Thursday afternoons, as you may experience shorter wait times.  Please be advised, all patients with office appointments requiring lab work will take precedent over walk-in lab work.    The office is located at [address], [place], [place], [place] [address] No appointment is necessary.   Labs are drawn by [facility]. Please bring your co-pay at the time of your lab draw.  You may receive a bill from [place] for your lab work.  Please note if you are on Hydroxychloroquine and and an order has been placed for a Hydroxychloroquine level, you will need to have it drawn 4 hours or more after your last dose.  If you wish to have your labs drawn at another location, please call the office 24 hours in advance to send orders.  If you have any questions regarding directions or hours of operation,  please call [phone].   As a reminder, please drink plenty of water prior  to coming for your lab work. Thanks!   Vaccines You are taking a medication(s) that can suppress your immune system.  The following immunizations are recommended: Flu annually Covid-19  Td/Tdap (tetanus, diphtheria, pertussis) every 10 years Pneumonia (Prevnar 15 then Pneumovax 23 at least 1 year apart.  Alternatively, can take Prevnar 20 without needing additional dose) Shingrix: 2 doses from 4 weeks to 6 months apart  Please check with your PCP to make sure you are up to date.   If you have signs or symptoms of an infection or start antibiotics: First, call your PCP for workup of your infection. Hold your medication through the infection, until you complete your antibiotics, and until symptoms resolve if you take the following: Injectable medication (Actemra, Benlysta, Cimzia, Cosentyx, Enbrel, Humira, Kevzara, Orencia, Remicade, Simponi, Stelara, Taltz, Tremfya) Methotrexate Leflunomide (Arava) Mycophenolate (Cellcept) Morrie Sheldon, Olumiant, or Rinvoq  Please get an annual skin examination to screen for skin cancer while you are on Cimzia. Heart Disease Prevention   Your inflammatory disease increases your risk of heart disease which includes heart attack, stroke, atrial fibrillation (irregular heartbeats), high blood pressure, heart failure and atherosclerosis (plaque in the arteries).  It is important to reduce your risk by:   Keep blood pressure, cholesterol, and blood sugar at healthy levels   Smoking Cessation   Maintain a healthy weight  BMI 20-25   Eat a healthy diet  Plenty of fresh fruit, vegetables, and whole grains  Limit  saturated fats, foods high in sodium, and added sugars  DASH and Mediterranean diet   Increase physical activity  Recommend moderate physically activity for 150 minutes per week/ 30 minutes a day for five days a week These can be broken up into three separate ten-minute sessions during the day.   Reduce Stress  Meditation, slow breathing exercises,  yoga, coloring books  Dental visits twice a year

## 2022-06-10 LAB — CBC WITH DIFFERENTIAL/PLATELET
Absolute Monocytes: 515 cells/uL (ref 200–950)
Basophils Absolute: 32 cells/uL (ref 0–200)
Basophils Relative: 0.7 %
Eosinophils Absolute: 212 cells/uL (ref 15–500)
Eosinophils Relative: 4.6 %
HCT: 37.9 % (ref 35.0–45.0)
Hemoglobin: 12.5 g/dL (ref 11.7–15.5)
Lymphs Abs: 1941 cells/uL (ref 850–3900)
MCH: 30.3 pg (ref 27.0–33.0)
MCHC: 33 g/dL (ref 32.0–36.0)
MCV: 92 fL (ref 80.0–100.0)
MPV: 11.3 fL (ref 7.5–12.5)
Monocytes Relative: 11.2 %
Neutro Abs: 1900 cells/uL (ref 1500–7800)
Neutrophils Relative %: 41.3 %
Platelets: 228 10*3/uL (ref 140–400)
RBC: 4.12 10*6/uL (ref 3.80–5.10)
RDW: 12.4 % (ref 11.0–15.0)
Total Lymphocyte: 42.2 %
WBC: 4.6 10*3/uL (ref 3.8–10.8)

## 2022-06-10 LAB — COMPLETE METABOLIC PANEL WITH GFR
AG Ratio: 1 (calc) (ref 1.0–2.5)
ALT: 12 U/L (ref 6–29)
AST: 20 U/L (ref 10–35)
Albumin: 4 g/dL (ref 3.6–5.1)
Alkaline phosphatase (APISO): 89 U/L (ref 31–125)
BUN: 8 mg/dL (ref 7–25)
CO2: 28 mmol/L (ref 20–32)
Calcium: 9.5 mg/dL (ref 8.6–10.2)
Chloride: 105 mmol/L (ref 98–110)
Creat: 0.76 mg/dL (ref 0.50–0.99)
Globulin: 4 g/dL (calc) — ABNORMAL HIGH (ref 1.9–3.7)
Glucose, Bld: 82 mg/dL (ref 65–99)
Potassium: 4.6 mmol/L (ref 3.5–5.3)
Sodium: 142 mmol/L (ref 135–146)
Total Bilirubin: 0.6 mg/dL (ref 0.2–1.2)
Total Protein: 8 g/dL (ref 6.1–8.1)
eGFR: 98 mL/min/{1.73_m2} (ref >=60)

## 2022-06-10 LAB — QUANTIFERON-TB GOLD PLUS
Mitogen-NIL: >10 IU/mL
NIL: 0.04 IU/mL
QuantiFERON-TB Gold Plus: NEGATIVE
TB1-NIL: 0 IU/mL
TB2-NIL: 0 IU/mL

## 2022-06-12 NOTE — Progress Notes (Signed)
CBC and CMP are normal.  TB Gold is negative.

## 2022-06-15 ENCOUNTER — Other Ambulatory Visit: Payer: Self-pay | Admitting: Neurosurgery

## 2022-06-15 ENCOUNTER — Ambulatory Visit
Admission: RE | Admit: 2022-06-15 | Discharge: 2022-06-15 | Disposition: A | Discharge status: Home / Self Care | Payer: 59 | Source: Ambulatory Visit | Attending: Internal Medicine | Admitting: Internal Medicine

## 2022-06-15 ENCOUNTER — Other Ambulatory Visit: Payer: Self-pay | Admitting: Internal Medicine

## 2022-06-15 DIAGNOSIS — Z1231 Encounter for screening mammogram for malignant neoplasm of breast: Secondary | ICD-10-CM

## 2022-06-28 ENCOUNTER — Ambulatory Visit: Payer: 59 | Attending: Rheumatology | Admitting: *Deleted

## 2022-06-28 ENCOUNTER — Telehealth: Payer: Self-pay | Admitting: *Deleted

## 2022-06-28 VITALS — BP 161/101 | HR 71

## 2022-06-28 DIAGNOSIS — M0579 Rheumatoid arthritis with rheumatoid factor of multiple sites without organ or systems involvement: Secondary | ICD-10-CM

## 2022-06-28 MED ORDER — CERTOLIZUMAB PEGOL 2 X 200 MG ~~LOC~~ KIT
400.0000 mg | PACK | Freq: Once | SUBCUTANEOUS | Status: AC
  Administered 2022-06-28: 400 mg via SUBCUTANEOUS

## 2022-06-28 NOTE — Telephone Encounter (Signed)
Patient contacted the office requesting to schedule her appointment for her Cimzia injection. Patient scheduled for 06/28/2022 at 2:00 pm.

## 2022-06-28 NOTE — Progress Notes (Signed)
Subjective:   Patient presents to clinic today to receive monthly dose of Cimzia.  Patient running a fever or have signs/symptoms of infection? No  Patient currently on antibiotics for the treatment of infection? No  Patient have any upcoming invasive procedures/surgeries? No  Objective: CMP     Component Value Date/Time   NA 142 06/06/2022 0909   NA 138 06/11/2018 1212   K 4.6 06/06/2022 0909   CL 105 06/06/2022 0909   CO2 28 06/06/2022 0909   GLUCOSE 82 06/06/2022 0909   BUN 8 06/06/2022 0909   BUN 9 06/11/2018 1212   CREATININE 0.76 06/06/2022 0909   CALCIUM 9.5 06/06/2022 0909   PROT 8.0 06/06/2022 0909   PROT 8.0 06/11/2018 1212   ALBUMIN 3.8 03/16/2021 1417   ALBUMIN 3.8 06/11/2018 1212   AST 20 06/06/2022 0909   ALT 12 06/06/2022 0909   ALKPHOS 67 03/16/2021 1417   BILITOT 0.6 06/06/2022 0909   BILITOT 0.3 06/11/2018 1212   GFRNONAA >60 03/30/2021 0310   GFRNONAA 72 07/02/2020 1106   GFRAA 84 07/02/2020 1106    CBC    Component Value Date/Time   WBC 4.6 06/06/2022 0909   RBC 4.12 06/06/2022 0909   HGB 12.5 06/06/2022 0909   HGB 11.9 06/11/2018 1212   HCT 37.9 06/06/2022 0909   HCT 36.6 06/11/2018 1212   PLT 228 06/06/2022 0909   PLT 310 06/11/2018 1212   MCV 92.0 06/06/2022 0909   MCV 93 06/11/2018 1212   MCH 30.3 06/06/2022 0909   MCHC 33.0 06/06/2022 0909   RDW 12.4 06/06/2022 0909   RDW 14.4 06/11/2018 1212   LYMPHSABS 1,941 06/06/2022 0909   LYMPHSABS 2.3 06/11/2018 1212   MONOABS 0.5 02/28/2021 1614   EOSABS 212 06/06/2022 0909   EOSABS 0.2 06/11/2018 1212   BASOSABS 32 06/06/2022 0909   BASOSABS 0.0 06/11/2018 1212    Baseline Immunosuppressant Therapy Labs TB GOLD    Latest Ref Rng & Units 06/06/2022    9:09 AM  Quantiferon TB Gold  Quantiferon TB Gold Plus NEGATIVE NEGATIVE    Hepatitis Panel    Latest Ref Rng & Units 06/13/2019    2:46 PM  Hepatitis  Hep B Surface Ag NON-REACTI NON-REACTIVE   Hep B IgM NON-REACTI NON-REACTIVE    Hep C Ab NON-REACTI NON-REACTIVE   Hep A IgM NON-REACTI NON-REACTIVE    HIV Lab Results  Component Value Date   HIV NON-REACTIVE 02/18/2020   Immunoglobulins    Latest Ref Rng & Units 02/18/2020    9:04 AM  Immunoglobulin Electrophoresis  IgA  47 - 310 mg/dL 651   IgG 600 - 1,640 mg/dL 2,218   IgM 50 - 300 mg/dL 78    SPEP    Latest Ref Rng & Units 06/06/2022    9:09 AM  Serum Protein Electrophoresis  Total Protein 6.1 - 8.1 g/dL 8.0    G6PD No results found for: "G6PDH" TPMT No results found for: "TPMT"   Chest x-ray: 01/27/2022 No acute cardiopulmonary process.  Assessment/Plan:   Administrations This Visit     certolizumab pegol (CIMZIA) kit 400 mg     Admin Date 06/28/2022 Action Given Dose 400 mg Route Subcutaneous Administered By Carole Binning, LPN             Patient tolerated injection well.   Appointment for next injection scheduled for 07/26/2022.  Patient due for labs in December 2023.  Patient is to call and reschedule appointment if running a  fever with signs/symptoms of infection, on antibiotics for active infection or has an upcoming invasive procedure.  All questions encouraged and answered.  Instructed patient to call with any further questions or concerns.

## 2022-06-30 ENCOUNTER — Ambulatory Visit: Payer: Self-pay

## 2022-07-03 ENCOUNTER — Encounter: Payer: Self-pay | Admitting: Adult Health

## 2022-07-03 ENCOUNTER — Ambulatory Visit (INDEPENDENT_AMBULATORY_CARE_PROVIDER_SITE_OTHER): Payer: 59 | Admitting: Adult Health

## 2022-07-03 DIAGNOSIS — G4733 Obstructive sleep apnea (adult) (pediatric): Secondary | ICD-10-CM | POA: Diagnosis not present

## 2022-07-03 NOTE — Progress Notes (Signed)
@Patient  ID: Jennifer [name], female    DOB: [birth year], 46 y.o.   MRN: [id number]  Chief Complaint  Patient presents with   Follow-up    Referring provider: Burnis Medin, MD  HPI: 46 year old female seen for sleep consult February 23, 2022 to establish for sleep apnea. Medical hx significant for RA, MHA   TEST/EVENTS :   positive HST 06/2017  for OSA with an AHI=53.8   07/03/2022 Follow up : OSA  Patient returns for 104-monthfollow-up.  Patient was seen last visit for sleep consult to establish for sleep apnea.  Patient had a sleep study in 2018 found to have severe sleep apnea with a AHI at 53.8/hour.  s. She was started on CPAP 09/2017   Patient feels that she benefits from CPAP.  She wears her CPAP every night, can not sleep without CPAP .  CPAP download requested. We have contacted DME, patient is going to go by and pick up SD  card .    Allergies  Allergen Reactions   Lisinopril Anaphylaxis   Lisinopril-Hydrochlorothiazide Swelling and Other (See Comments)    Angioedema  Has tolerated maxide in past so most likely  The ACE inhibitor as the cause     Immunization History  Administered Date(s) Administered   Influenza Split 08/21/2012   Influenza,inj,Quad PF,6+ Mos 07/31/2014, 08/19/2019   Pneumococcal Conjugate-13 08/28/2012    Past Medical History:  Diagnosis Date   Acute otitis media 08/28/2012   improved  change to liquid medication    Breast abscess of female    Recurrent   Family history of adverse reaction to anesthesia    father hard time waking once (12/20/2016)   Fibroids    w/bleeding   GERD (gastroesophageal reflux disease)    History of blood transfusion    after surgery   Hypertension    Migraines    Dr. MOrie Rout "I have a few/month" (12/20/2016)   Osteoarthritis    Pill esophagitis 08/28/2012   amoxicillin  by hx  disc plan nl voice except hoarse not drooling  close fu  if not getting better with plan stop aleve  liquid ibu onlyf necessary     Recurrent periodic urticaria    Rheumatoid arthritis ([place]    "55% of my body" (12/20/2016)   Rheumatoid arthritis ([place]    Sleep apnea    wears cpap    Tobacco History: Social History   Tobacco Use  Smoking Status Never   Passive exposure: Never  Smokeless Tobacco Never   Counseling given: Not Answered   Outpatient Medications Prior to Visit  Medication Sig Dispense Refill   albuterol (VENTOLIN HFA) 108 (90 Base) MCG/ACT inhaler TAKE 2 PUFFS BY MOUTH EVERY 6 HOURS AS NEEDED FOR WHEEZE OR SHORTNESS OF BREATH     amLODipine (NORVASC) 2.5 MG tablet Take 1 tablet (2.5 mg total) by mouth daily. In addition to 5 mg amlodipine ( total 7.5 mg per day) 90 tablet 1   cetirizine (ZYRTEC) 10 MG tablet Take 10 mg by mouth daily as needed for allergies.     ibuprofen (ADVIL) 800 MG tablet Take 1 tablet (800 mg total) by mouth every 8 (eight) hours as needed (pain). 21 tablet 0   Levonorgestrel (KYLEENA) 19.5 MG IUD 19.5 mg by Intrauterine route once.     methocarbamol (ROBAXIN) 500 MG tablet Take 1 tablet (500 mg total) by mouth every 8 (eight) hours as needed for muscle spasms. 20 tablet 0   NON FORMULARY  Pt uses cpap nightly     prednisoLONE acetate (PRED FORTE) 1 % ophthalmic suspension Place 1 drop into the left eye 2 (two) times daily. As needed     ramelteon (ROZEREM) 8 MG tablet TAKE 1 TABLET BY MOUTH AT BEDTIME. (Patient taking differently: at bedtime as needed.) 90 tablet 1   SUMAtriptan (TOSYMRA) 10 MG/ACT SOLN Place 10 mg into the nose once as needed for up to 1 dose. May repeat after 1 hour.  Maximum 2 sprays in 24 hours. 6 each 11   certolizumab pegol (CIMZIA) 2 X 200 MG KIT Inject 400 mg into the skin every 28 (twenty-eight) days. (Patient not taking: Reported on 06/06/2022)     Fremanezumab-vfrm (AJOVY) 225 MG/1.5ML SOAJ Inject 225 mg into the skin every 28 (twenty-eight) days. (Patient not taking: Reported on 06/06/2022) 1.68 mL 11   triamcinolone (NASACORT) 55 MCG/ACT AERO nasal  inhaler Nasal Allergy 55 mcg spray aerosol  INHALE 2 SPRAYS IN EACH NOSTRIL AT NIGHT (Patient not taking: Reported on 06/06/2022)     No facility-administered medications prior to visit.     Review of Systems:   Constitutional:   No  weight loss, night sweats,  Fevers, chills, fatigue, or  lassitude.  HEENT:   No headaches,  Difficulty swallowing,  Tooth/dental problems, or  Sore throat,                No sneezing, itching, ear ache, nasal congestion, post nasal drip,   CV:  No chest pain,  Orthopnea, PND, swelling in lower extremities, anasarca, dizziness, palpitations, syncope.   GI  No heartburn, indigestion, abdominal pain, nausea, vomiting, diarrhea, change in bowel habits, loss of appetite, bloody stools.   Resp: No shortness of breath with exertion or at rest.  No excess mucus, no productive cough,  No non-productive cough,  No coughing up of blood.  No change in color of mucus.  No wheezing.  No chest wall deformity  Skin: no rash or lesions.  GU: no dysuria, change in color of urine, no urgency or frequency.  No flank pain, no hematuria   MS:  No joint pain or swelling.  No decreased range of motion.  No back pain.    Physical Exam  BP (!) 148/92 (BP Location: Left Arm, Patient Position: Sitting, Cuff Size: Normal)   Pulse 65   Temp 97.8 F (36.6 C) (Oral)   Ht 5' 4"  (1.626 m)   Wt 250 lb 9.6 oz (113.7 kg)   SpO2 98%   BMI 43.02 kg/m   GEN: A/Ox3; pleasant , NAD, well nourished    HEENT:  [place]/AT,   NOSE-clear, THROAT-clear, no lesions, no postnasal drip or exudate noted. Class 2-3 MP airway   NECK:  Supple w/ fair ROM; no JVD; normal carotid impulses w/o bruits; no thyromegaly or nodules palpated; no lymphadenopathy.    RESP  Clear  P & A; w/o, wheezes/ rales/ or rhonchi. no accessory muscle use, no dullness to percussion  CARD:  RRR, no m/r/g, no peripheral edema, pulses intact, no cyanosis or clubbing.  GI:   Soft & nt; nml bowel sounds; no organomegaly or  masses detected.   Musco: Warm bil, no deformities or joint swelling noted.   Neuro: alert, no focal deficits noted.    Skin: Warm, no lesions or rashes    Lab Results:  CBC    Component Value Date/Time   WBC 4.6 06/06/2022 0909   RBC 4.12 06/06/2022 0909   HGB 12.5 06/06/2022  0909   HGB 11.9 06/11/2018 1212   HCT 37.9 06/06/2022 0909   HCT 36.6 06/11/2018 1212   PLT 228 06/06/2022 0909   PLT 310 06/11/2018 1212   MCV 92.0 06/06/2022 0909   MCV 93 06/11/2018 1212   MCH 30.3 06/06/2022 0909   MCHC 33.0 06/06/2022 0909   RDW 12.4 06/06/2022 0909   RDW 14.4 06/11/2018 1212   LYMPHSABS 1,941 06/06/2022 0909   LYMPHSABS 2.3 06/11/2018 1212   MONOABS 0.5 02/28/2021 1614   EOSABS 212 06/06/2022 0909   EOSABS 0.2 06/11/2018 1212   BASOSABS 32 06/06/2022 0909   BASOSABS 0.0 06/11/2018 1212    BMET    Component Value Date/Time   NA 142 06/06/2022 0909   NA 138 06/11/2018 1212   K 4.6 06/06/2022 0909   CL 105 06/06/2022 0909   CO2 28 06/06/2022 0909   GLUCOSE 82 06/06/2022 0909   BUN 8 06/06/2022 0909   BUN 9 06/11/2018 1212   CREATININE 0.76 06/06/2022 0909   CALCIUM 9.5 06/06/2022 0909   GFRNONAA >60 03/30/2021 0310   GFRNONAA 72 07/02/2020 1106   GFRAA 84 07/02/2020 1106    BNP No results found for: "BNP"  ProBNP No results found for: "PROBNP"  Imaging: MM 3D SCREEN BREAST BILATERAL  Result Date: 06/16/2022 CLINICAL DATA:  Screening. EXAM: DIGITAL SCREENING BILATERAL MAMMOGRAM WITH TOMOSYNTHESIS AND CAD TECHNIQUE: Bilateral screening digital craniocaudal and mediolateral oblique mammograms were obtained. Bilateral screening digital breast tomosynthesis was performed. The images were evaluated with computer-aided detection. COMPARISON:  Previous exam(s). ACR Breast Density Category b: There are scattered areas of fibroglandular density. FINDINGS: There are no findings suspicious for malignancy. IMPRESSION: No mammographic evidence of malignancy. A result letter  of this screening mammogram will be mailed directly to the patient. RECOMMENDATION: Screening mammogram in one year. (Code:SM-B-01Y) BI-RADS CATEGORY  1: Negative. Electronically Signed   By: Lajean Manes M.D.   On: 40/37/5436 06:77    certolizumab pegol (CIMZIA) kit 400 mg     Date Action Dose Route User   06/28/2022 1433 Given 400 mg Subcutaneous (Left Lower Abdomen) Carole Binning, LPN           No data to display          No results found for: "NITRICOXIDE"      Assessment & Plan:   Sleep apnea, obstructive Perceived benefit on CPAP , CPAP download requested. SD card ordered - patient will go by DME and pick up SD card   Plan  Patient Instructions  Wear CPAP At bedtime all night .  CPAP download .  Work on healthy weight loss  Do not drive if sleepy .  SD card  Follow up in 1 year and As needed       Morbid obesity ([place]) Healthy weight loss.      Rexene Edison, NP 07/03/2022

## 2022-07-03 NOTE — Assessment & Plan Note (Signed)
Healthy weight loss 

## 2022-07-03 NOTE — Assessment & Plan Note (Signed)
Perceived benefit on CPAP , CPAP download requested. SD card ordered - patient will go by DME and pick up SD card   Plan  Patient Instructions  Wear CPAP At bedtime all night .  CPAP download .  Work on healthy weight loss  Do not drive if sleepy .  SD card  Follow up in 1 year and As needed

## 2022-07-03 NOTE — Patient Instructions (Signed)
Wear CPAP At bedtime all night .  CPAP download .  Work on healthy weight loss  Do not drive if sleepy .  SD card  Follow up in 1 year and As needed

## 2022-07-05 NOTE — Progress Notes (Signed)
Reviewed and agree with assessment/plan.   Chesley Mires, MD [facility] Pulmonary/Critical Care 07/05/2022, 9:18 AM Pager:  [phone]

## 2022-07-26 ENCOUNTER — Ambulatory Visit: Payer: 59 | Attending: Rheumatology | Admitting: *Deleted

## 2022-07-26 VITALS — BP 168/102 | HR 80

## 2022-07-26 DIAGNOSIS — M0579 Rheumatoid arthritis with rheumatoid factor of multiple sites without organ or systems involvement: Secondary | ICD-10-CM

## 2022-07-26 MED ORDER — CERTOLIZUMAB PEGOL 2 X 200 MG ~~LOC~~ KIT
400.0000 mg | PACK | Freq: Once | SUBCUTANEOUS | Status: AC
  Administered 2022-07-26: 400 mg via SUBCUTANEOUS

## 2022-07-26 NOTE — Progress Notes (Signed)
Subjective:   Patient presents to clinic today to receive monthly dose of Cimzia.  Patient running a fever or have signs/symptoms of infection? No  Patient currently on antibiotics for the treatment of infection? No  Patient have any upcoming invasive procedures/surgeries? No  Objective: CMP     Component Value Date/Time   NA 142 06/06/2022 0909   NA 138 06/11/2018 1212   K 4.6 06/06/2022 0909   CL 105 06/06/2022 0909   CO2 28 06/06/2022 0909   GLUCOSE 82 06/06/2022 0909   BUN 8 06/06/2022 0909   BUN 9 06/11/2018 1212   CREATININE 0.76 06/06/2022 0909   CALCIUM 9.5 06/06/2022 0909   PROT 8.0 06/06/2022 0909   PROT 8.0 06/11/2018 1212   ALBUMIN 3.8 03/16/2021 1417   ALBUMIN 3.8 06/11/2018 1212   AST 20 06/06/2022 0909   ALT 12 06/06/2022 0909   ALKPHOS 67 03/16/2021 1417   BILITOT 0.6 06/06/2022 0909   BILITOT 0.3 06/11/2018 1212   GFRNONAA >60 03/30/2021 0310   GFRNONAA 72 07/02/2020 1106   GFRAA 84 07/02/2020 1106    CBC    Component Value Date/Time   WBC 4.6 06/06/2022 0909   RBC 4.12 06/06/2022 0909   HGB 12.5 06/06/2022 0909   HGB 11.9 06/11/2018 1212   HCT 37.9 06/06/2022 0909   HCT 36.6 06/11/2018 1212   PLT 228 06/06/2022 0909   PLT 310 06/11/2018 1212   MCV 92.0 06/06/2022 0909   MCV 93 06/11/2018 1212   MCH 30.3 06/06/2022 0909   MCHC 33.0 06/06/2022 0909   RDW 12.4 06/06/2022 0909   RDW 14.4 06/11/2018 1212   LYMPHSABS 1,941 06/06/2022 0909   LYMPHSABS 2.3 06/11/2018 1212   MONOABS 0.5 02/28/2021 1614   EOSABS 212 06/06/2022 0909   EOSABS 0.2 06/11/2018 1212   BASOSABS 32 06/06/2022 0909   BASOSABS 0.0 06/11/2018 1212    Baseline Immunosuppressant Therapy Labs TB GOLD    Latest Ref Rng & Units 06/06/2022    9:09 AM  Quantiferon TB Gold  Quantiferon TB Gold Plus NEGATIVE NEGATIVE    Hepatitis Panel    Latest Ref Rng & Units 06/13/2019    2:46 PM  Hepatitis  Hep B Surface Ag NON-REACTI NON-REACTIVE   Hep B IgM NON-REACTI NON-REACTIVE    Hep C Ab NON-REACTI NON-REACTIVE   Hep A IgM NON-REACTI NON-REACTIVE    HIV Lab Results  Component Value Date   HIV NON-REACTIVE 02/18/2020   Immunoglobulins    Latest Ref Rng & Units 02/18/2020    9:04 AM  Immunoglobulin Electrophoresis  IgA  47 - 310 mg/dL 651   IgG 600 - 1,640 mg/dL 2,218   IgM 50 - 300 mg/dL 78    SPEP    Latest Ref Rng & Units 06/06/2022    9:09 AM  Serum Protein Electrophoresis  Total Protein 6.1 - 8.1 g/dL 8.0    G6PD No results found for: "G6PDH" TPMT No results found for: "TPMT"   Chest x-ray: 01/27/2022 No acute cardiopulmonary process   Assessment/Plan:   Patient tolerated injection well.    Appointment for next injection scheduled for 08/23/2022.  Patient due for labs in December 2023.  Patient is to call and reschedule appointment if running a fever with signs/symptoms of infection, on antibiotics for active infection or has an upcoming invasive procedure.  All questions encouraged and answered.  Instructed patient to call with any further questions or concerns.

## 2022-08-18 ENCOUNTER — Telehealth: Payer: Self-pay | Admitting: Pharmacist

## 2022-08-18 NOTE — Telephone Encounter (Signed)
Submitted request for reverification of benefits for Cimzia in-office injections for 2024.   Left VM for patient to determine if she anticipates any insurance changes with new year. Requested return call  Knox Saliva, PharmD, MPH, BCPS, CPP Clinical Pharmacist (Rheumatology and Pulmonology)

## 2022-08-22 NOTE — Telephone Encounter (Signed)
Spoke with patient. She states she is having insurance change with new year. Will be potentially [employer] but does not know details as she has not yet received card. Urged her to notify us of insurance change once she receives card as we will have to re-run benefits on or after 09/18/2022  Cancelled reverification of benefits as a new case will have to be started with te new year.   Knox Saliva, PharmD, MPH, BCPS, CPP Clinical Pharmacist (Rheumatology and Pulmonology)

## 2022-08-23 ENCOUNTER — Ambulatory Visit: Payer: 59

## 2022-08-29 ENCOUNTER — Ambulatory Visit: Payer: 59 | Attending: Rheumatology | Admitting: *Deleted

## 2022-08-29 VITALS — BP 152/97 | HR 77

## 2022-08-29 DIAGNOSIS — M0579 Rheumatoid arthritis with rheumatoid factor of multiple sites without organ or systems involvement: Secondary | ICD-10-CM

## 2022-08-29 MED ORDER — CERTOLIZUMAB PEGOL 2 X 200 MG ~~LOC~~ KIT
400.0000 mg | PACK | Freq: Once | SUBCUTANEOUS | Status: AC
  Administered 2022-08-29: 400 mg via SUBCUTANEOUS

## 2022-08-29 NOTE — Progress Notes (Signed)
Subjective:   Patient presents to clinic today to receive monthly dose of Cimzia.  Patient running a fever or have signs/symptoms of infection? No  Patient currently on antibiotics for the treatment of infection? No  Patient have any upcoming invasive procedures/surgeries? No  Objective: CMP     Component Value Date/Time   NA 142 06/06/2022 0909   NA 138 06/11/2018 1212   K 4.6 06/06/2022 0909   CL 105 06/06/2022 0909   CO2 28 06/06/2022 0909   GLUCOSE 82 06/06/2022 0909   BUN 8 06/06/2022 0909   BUN 9 06/11/2018 1212   CREATININE 0.76 06/06/2022 0909   CALCIUM 9.5 06/06/2022 0909   PROT 8.0 06/06/2022 0909   PROT 8.0 06/11/2018 1212   ALBUMIN 3.8 03/16/2021 1417   ALBUMIN 3.8 06/11/2018 1212   AST 20 06/06/2022 0909   ALT 12 06/06/2022 0909   ALKPHOS 67 03/16/2021 1417   BILITOT 0.6 06/06/2022 0909   BILITOT 0.3 06/11/2018 1212   GFRNONAA >60 03/30/2021 0310   GFRNONAA 72 07/02/2020 1106   GFRAA 84 07/02/2020 1106    CBC    Component Value Date/Time   WBC 4.6 06/06/2022 0909   RBC 4.12 06/06/2022 0909   HGB 12.5 06/06/2022 0909   HGB 11.9 06/11/2018 1212   HCT 37.9 06/06/2022 0909   HCT 36.6 06/11/2018 1212   PLT 228 06/06/2022 0909   PLT 310 06/11/2018 1212   MCV 92.0 06/06/2022 0909   MCV 93 06/11/2018 1212   MCH 30.3 06/06/2022 0909   MCHC 33.0 06/06/2022 0909   RDW 12.4 06/06/2022 0909   RDW 14.4 06/11/2018 1212   LYMPHSABS 1,941 06/06/2022 0909   LYMPHSABS 2.3 06/11/2018 1212   MONOABS 0.5 02/28/2021 1614   EOSABS 212 06/06/2022 0909   EOSABS 0.2 06/11/2018 1212   BASOSABS 32 06/06/2022 0909   BASOSABS 0.0 06/11/2018 1212    Baseline Immunosuppressant Therapy Labs TB GOLD    Latest Ref Rng & Units 06/06/2022    9:09 AM  Quantiferon TB Gold  Quantiferon TB Gold Plus NEGATIVE NEGATIVE    Hepatitis Panel    Latest Ref Rng & Units 06/13/2019    2:46 PM  Hepatitis  Hep B Surface Ag NON-REACTI NON-REACTIVE   Hep B IgM NON-REACTI NON-REACTIVE    Hep C Ab NON-REACTI NON-REACTIVE   Hep A IgM NON-REACTI NON-REACTIVE    HIV Lab Results  Component Value Date   HIV NON-REACTIVE 02/18/2020   Immunoglobulins    Latest Ref Rng & Units 02/18/2020    9:04 AM  Immunoglobulin Electrophoresis  IgA  47 - 310 mg/dL 651   IgG 600 - 1,640 mg/dL 2,218   IgM 50 - 300 mg/dL 78    SPEP    Latest Ref Rng & Units 06/06/2022    9:09 AM  Serum Protein Electrophoresis  Total Protein 6.1 - 8.1 g/dL 8.0    G6PD No results found for: "G6PDH" TPMT No results found for: "TPMT"   Chest x-ray: 01/27/2022 No acute cardiopulmonary process    Assessment/Plan:   Administrations This Visit     certolizumab pegol (CIMZIA) kit 400 mg     Admin Date 08/29/2022 Action Given Dose 400 mg Route Subcutaneous Administered By Carole Binning, LPN             Patient tolerated injection well.   Appointment for next injection scheduled for 09/26/2022.  Patient due for labs in December 2023 and will return to the office next week to  have them drawn.  Patient is to call and reschedule appointment if running a fever with signs/symptoms of infection, on antibiotics for active infection or has an upcoming invasive procedure.  All questions encouraged and answered.  Instructed patient to call with any further questions or concerns.   

## 2022-09-23 ENCOUNTER — Ambulatory Visit
Admission: EM | Admit: 2022-09-23 | Discharge: 2022-09-23 | Disposition: A | Discharge status: Home / Self Care | Payer: Managed Care, Other (non HMO) | Attending: Urgent Care | Admitting: Urgent Care

## 2022-09-23 DIAGNOSIS — B349 Viral infection, unspecified: Secondary | ICD-10-CM | POA: Insufficient documentation

## 2022-09-23 DIAGNOSIS — Z79899 Other long term (current) drug therapy: Secondary | ICD-10-CM | POA: Insufficient documentation

## 2022-09-23 DIAGNOSIS — Z1152 Encounter for screening for COVID-19: Secondary | ICD-10-CM | POA: Insufficient documentation

## 2022-09-23 DIAGNOSIS — J309 Allergic rhinitis, unspecified: Secondary | ICD-10-CM | POA: Diagnosis not present

## 2022-09-23 DIAGNOSIS — M069 Rheumatoid arthritis, unspecified: Secondary | ICD-10-CM | POA: Diagnosis not present

## 2022-09-23 LAB — SARS CORONAVIRUS 2 (TAT 6-24 HRS): SARS Coronavirus 2: NEGATIVE

## 2022-09-23 MED ORDER — PROMETHAZINE-DM 6.25-15 MG/5ML PO SYRP: 5.0000 mL | ORAL_SOLUTION | Freq: Three times a day (TID) | ORAL | 0 refills | Status: DC | PRN

## 2022-09-23 MED ORDER — LEVOCETIRIZINE DIHYDROCHLORIDE 5 MG PO TABS: 5.0000 mg | ORAL_TABLET | Freq: Every evening | ORAL | 0 refills | Status: DC

## 2022-09-23 MED ORDER — IPRATROPIUM BROMIDE 0.03 % NA SOLN: 2.0000 | Freq: Two times a day (BID) | NASAL | 0 refills | Status: DC

## 2022-09-23 NOTE — ED Triage Notes (Signed)
Pt c/o dry cough and body aches stared yesterday-NAD-steady gait

## 2022-09-23 NOTE — ED Provider Notes (Signed)
[facility]  Note:  This document was prepared using Systems analyst and may include unintentional dictation errors.  MRN: [id number] DOB: [birth year]  Subjective:   Jennifer [name] is a 47 y.o. female presenting for 1 day history of acute onset body aches, malaise, fatigue, dry coughing, drainage, congestion. Has RA, takes CIMZIA injections weekly for this. No history of CKD. No chest pain, shob, wheezing. No smoking. No asthma. Was prescribed an inhaler recently but she is not sure why. Has allergic rhinitis but does not take medications consistently for this.   No current facility-administered medications for this encounter.  Current Outpatient Medications:    albuterol (VENTOLIN HFA) 108 (90 Base) MCG/ACT inhaler, TAKE 2 PUFFS BY MOUTH EVERY 6 HOURS AS NEEDED FOR WHEEZE OR SHORTNESS OF BREATH, Disp: , Rfl:    amLODipine (NORVASC) 2.5 MG tablet, Take 1 tablet (2.5 mg total) by mouth daily. In addition to 5 mg amlodipine ( total 7.5 mg per day), Disp: 90 tablet, Rfl: 1   certolizumab pegol (CIMZIA) 2 X 200 MG KIT, Inject 400 mg into the skin every 28 (twenty-eight) days. (Patient not taking: Reported on 06/06/2022), Disp: , Rfl:    cetirizine (ZYRTEC) 10 MG tablet, Take 10 mg by mouth daily as needed for allergies., Disp: , Rfl:    Fremanezumab-vfrm (AJOVY) 225 MG/1.5ML SOAJ, Inject 225 mg into the skin every 28 (twenty-eight) days. (Patient not taking: Reported on 06/06/2022), Disp: 1.68 mL, Rfl: 11   ibuprofen (ADVIL) 800 MG tablet, Take 1 tablet (800 mg total) by mouth every 8 (eight) hours as needed (pain)., Disp: 21 tablet, Rfl: 0   Levonorgestrel (KYLEENA) 19.5 MG IUD, 19.5 mg by Intrauterine route once., Disp: , Rfl:    methocarbamol (ROBAXIN) 500 MG tablet, Take 1 tablet (500 mg total) by mouth every 8 (eight) hours as needed for muscle spasms., Disp: 20 tablet, Rfl: 0   NON FORMULARY, Pt uses cpap nightly, Disp: , Rfl:    prednisoLONE acetate  (PRED FORTE) 1 % ophthalmic suspension, Place 1 drop into the left eye 2 (two) times daily. As needed, Disp: , Rfl:    ramelteon (ROZEREM) 8 MG tablet, TAKE 1 TABLET BY MOUTH AT BEDTIME. (Patient taking differently: at bedtime as needed.), Disp: 90 tablet, Rfl: 1   SUMAtriptan (TOSYMRA) 10 MG/ACT SOLN, Place 10 mg into the nose once as needed for up to 1 dose. May repeat after 1 hour.  Maximum 2 sprays in 24 hours., Disp: 6 each, Rfl: 11   triamcinolone (NASACORT) 55 MCG/ACT AERO nasal inhaler, Nasal Allergy 55 mcg spray aerosol  INHALE 2 SPRAYS IN EACH NOSTRIL AT NIGHT (Patient not taking: Reported on 06/06/2022), Disp: , Rfl:    Allergies  Allergen Reactions   Lisinopril Anaphylaxis   Lisinopril-Hydrochlorothiazide Swelling and Other (See Comments)    Angioedema  Has tolerated maxide in past so most likely  The ACE inhibitor as the cause     Past Medical History:  Diagnosis Date   Acute otitis media 08/28/2012   improved  change to liquid medication    Breast abscess of female    Recurrent   Family history of adverse reaction to anesthesia    father hard time waking once (12/20/2016)   Fibroids    w/bleeding   GERD (gastroesophageal reflux disease)    History of blood transfusion    after surgery   Hypertension    Migraines    Dr. Orie Rout; "I have a few/month" (  12/20/2016)   Osteoarthritis    Pill esophagitis 08/28/2012   amoxicillin  by hx  disc plan nl voice except hoarse not drooling  close fu  if not getting better with plan stop aleve  liquid ibu onlyf necessary    Recurrent periodic urticaria    Rheumatoid arthritis ([place])    "55% of my body" (12/20/2016)   Rheumatoid arthritis ([place])    Sleep apnea    wears cpap     Past Surgical History:  Procedure Laterality Date   BREAST SURGERY     abcess under left breast    CYST EXCISION Left 2014   "elbow"   HERNIA REPAIR     INCISION AND DRAINAGE BREAST ABSCESS Left 2003   INCISIONAL HERNIA REPAIR N/A 12/18/2016    Procedure: REPAIR INCISIONAL HERNIA WITH MESH;  Surgeon: Georganna Skeans, MD;  Location: [facility];  Service: General;  Laterality: N/A;   INSERTION OF MESH N/A 12/18/2016   Procedure: INSERTION OF MESH;  Surgeon: Georganna Skeans, MD;  Location: [facility];  Service: General;  Laterality: N/A;   KNEE ARTHROSCOPY WITH MENISCAL REPAIR Right 06/17/2018   Procedure: RIGHT KNEE ARTHROSCOPY WITH MENISCAL REPAIR;  Surgeon: Vickey Huger, MD;  Location: [facility];  Service: Orthopedics;  Laterality: Right;   MYOMECTOMY N/A 09/06/2016   Procedure: Lendell Caprice;  Surgeon: Governor Specking, MD;  Location: [facility];  Service: Gynecology;  Laterality: N/A;   ROOT CANAL  05/2022   TOTAL KNEE ARTHROPLASTY Right 03/28/2021   Procedure: TOTAL KNEE ARTHROPLASTY;  Surgeon: Gaynelle Arabian, MD;  Location: [facility];  Service: Orthopedics;  Laterality: Right;  41mn   UTERINE FIBROID SURGERY     34 fibroids    Family History  Problem Relation Age of Onset   Hypertension Mother    Rheum arthritis Mother    Hypertension Father    Sleep apnea Father    Breast cancer Neg Hx     Social History   Tobacco Use   Smoking status: Never    Passive exposure: Never   Smokeless tobacco: Never  Vaping Use   Vaping Use: Never used  Substance Use Topics   Alcohol use: Yes    Comment: social   Drug use: Never    ROS   Objective:   Vitals: BP (!) 168/116 (BP Location: Left Arm)   Pulse 70   Temp 97.8 F (36.6 C) (Oral)   Resp 18   SpO2 95%   Physical Exam Constitutional:      General: She is not in acute distress.    Appearance: Normal appearance. She is well-developed and normal weight. She is not ill-appearing, toxic-appearing or diaphoretic.  HENT:     Head: Normocephalic and atraumatic.     Right Ear: Tympanic membrane, ear canal and external ear normal. No drainage or tenderness. No middle ear effusion. There is no impacted cerumen. Tympanic membrane is not erythematous or bulging.     Left Ear: Tympanic  membrane, ear canal and external ear normal. No drainage or tenderness.  No middle ear effusion. There is no impacted cerumen. Tympanic membrane is not erythematous or bulging.     Nose: Congestion present. No rhinorrhea.     Mouth/Throat:     Mouth: Mucous membranes are moist. No oral lesions.     Pharynx: No pharyngeal swelling, oropharyngeal exudate, posterior oropharyngeal erythema or uvula swelling.     Tonsils: No tonsillar exudate or tonsillar abscesses.  Eyes:     General: No scleral icterus.  Right eye: No discharge.        Left eye: No discharge.     Extraocular Movements: Extraocular movements intact.     Right eye: Normal extraocular motion.     Left eye: Normal extraocular motion.     Conjunctiva/sclera: Conjunctivae normal.  Cardiovascular:     Rate and Rhythm: Normal rate and regular rhythm.     Heart sounds: Normal heart sounds. No murmur heard.    No friction rub. No gallop.  Pulmonary:     Effort: Pulmonary effort is normal. No respiratory distress.     Breath sounds: No stridor. No wheezing, rhonchi or rales.  Chest:     Chest wall: No tenderness.  Musculoskeletal:     Cervical back: Normal range of motion and neck supple.  Lymphadenopathy:     Cervical: No cervical adenopathy.  Skin:    General: Skin is warm and dry.  Neurological:     General: No focal deficit present.     Mental Status: She is alert and oriented to person, place, and time.  Psychiatric:        Mood and Affect: Mood normal.        Behavior: Behavior normal.     Assessment and Plan :   PDMP not reviewed this encounter.  1. Acute viral syndrome   2. Allergic rhinitis, unspecified seasonality, unspecified trigger   3. Rheumatoid arthritis, involving unspecified site, unspecified whether rheumatoid factor present [facility])     Deferred imaging given clear cardiopulmonary exam, hemodynamically stable vital signs. Will manage for viral illness such as viral URI, viral syndrome, viral  rhinitis, COVID-19. Recommended supportive care. Offered scripts for symptomatic relief. Testing is pending. Counseled patient on potential for adverse effects with medications prescribed/recommended today, ER and return-to-clinic precautions discussed, patient verbalized understanding.   Patient should definitely undergo Paxlovid should she test positive for COVID-19.  Last GFR was 98 from September 2023.   Jaynee Eagles, [place] 09/23/22 1148

## 2022-09-23 NOTE — Discharge Instructions (Addendum)
We will notify you of your test results as they arrive and may take between about 24 hours.  I encourage you to sign up for MyChart if you have not already done so as this can be the easiest way for [place] to communicate results to you online or through a phone app.  Generally, we only contact you if it is a positive test result.  In the meantime, if you develop worsening symptoms including fever, chest pain, shortness of breath despite our current treatment plan then please report to the emergency room as this may be a sign of worsening status from possible viral infection.  Otherwise, we will manage this as a viral syndrome. For sore throat or cough try using a honey-based tea. Use 3 teaspoons of honey with juice squeezed from half lemon. Place shaved pieces of ginger into 1/2-1 cup of water and warm over stove top. Then mix the ingredients and repeat every 4 hours as needed. Please take Tylenol '500mg'$ -'650mg'$  every 6 hours for aches and pains, fevers. Hydrate very well with at least 2 liters of water. Eat light meals such as soups to replenish electrolytes and soft fruits, veggies. Start an antihistamine like Xyzal for postnasal drainage, sinus congestion.  You can take this together with the nasal spray 2 times a day as needed for the same kind of congestion.  Use the cough medications as needed.

## 2022-09-24 ENCOUNTER — Ambulatory Visit: Payer: Self-pay

## 2022-09-26 ENCOUNTER — Ambulatory Visit: Payer: 59

## 2022-09-27 NOTE — Telephone Encounter (Addendum)
Submitted [employer] Enrollment and Benefits Verification form via fax. Faxed with [employer] pre-certification form. Sent with clinicals. Patient has Jennifer [name] that is active as of 09/18/2022.  Insurance card copy is in media tab of chart.  Fax: [phone] Phone: [phone] HUB ID # [id number]  Knox Saliva, PharmD, MPH, BCPS, CPP Clinical Pharmacist (Rheumatology and Pulmonology)

## 2022-10-11 ENCOUNTER — Telehealth: Payer: Self-pay | Admitting: *Deleted

## 2022-10-11 NOTE — Telephone Encounter (Signed)
Received a message from [employer] a Tourist information centre manager with [employer]. She states she is calling to get some information regarding a prior authorization for Cimzia. She states on the portal it is documented a PA has been submitted. When calling [employer], the indicate they don't have one on file. Fax Number [phone]. Call back number [phone] ext. [place]

## 2022-10-16 NOTE — Progress Notes (Unsigned)
NEUROLOGY FOLLOW UP OFFICE NOTE  Jennifer [name] [id number]  Assessment/Plan:   Migraine without aura, without status migrainosus, not intractable Hypertension  Migraine prevention:  Start Ajovy every 28 days.  Would not use Aimovig as she already has hypertension Migraine rescue:  Tosymra NS - previously effective.  Already tried rizatriptan and eletriptan.  Generic sumatriptan NS not effective.   Limit use of pain relievers to no more than 2 days out of week to prevent risk of rebound or medication-overuse headache. Keep headache diary Follow up with PCP regarding blood pressure Follow up 4 months.  Subjective:  Jennifer [name] is a 47 year old female with rheumatoid arthritis who follows up for migraines.  UPDATE: Jennifer [name] last visit.  *** Tosymra NS ***   Intensity:  severe Duration:  2-3 hours with rizatriptan Frequency:  3 days a week Current NSAIDS:  Naproxen '500mg'$  Current analgesics:  Excedrin Migraine Current triptans:  rizatriptan Current ergotamine:  none Current anti-emetic:  none Current muscle relaxants:  Robaxin Current anti-anxiolytic:  none Current sleep aide:  Melatonin '10mg'$  PRN Current Antihypertensive medications:  amlodipine Current Antidepressant medications:  none Current Anticonvulsant medications:  gabapentin '300mg'$  TID PRN Current anti-CGRP:  *** Current Vitamins/Herbal/Supplements:  Folic acid; melatonin '10mg'$  PRN Current Antihistamines/Decongestants:  Zyrtec Other therapy:  none Hormone/birth control:  Kyleena Other medications:  methotrexate  Caffeine:  1 to 2 cups of coffee daily.  Sometimes at 4 PM.  No soda or tea Diet:  No soda.  Four 16 oz bottles of water daily.   Depression:  no; Anxiety:  Yes (family-related Other pain:  arthritic pain in her other knee Sleep hygiene:  Poor.  She was diagnosed with OSA and uses a CPAP.  However, she can't turn off her mind.  4 to 6 hours of sleep a night.  Takes melatonin  HISTORY:  She  started having migraines in her late-20s to early-30s.  They are severe mid-frontal/bitemporal/back of neck throbbing pain.  They are associated with seeing white spots when severe, photophobia, phonophobia, rarely nausea..  Her migraines typically last 2 hours to 2 days and occur 2 to 3 times a month.  Triggers include stress and fluorescent lights.     She developed a migraine on 09/18/2019, however it was persistent.  She was subsequently diagnosed with Covid a few days later.  The migraine did not break.  She was prescribed a prednisone taper on 10/08/2019 and the migraine broke.  She currently reports increased family-related stress.        Past NSAIDS/ steroids:  Ibuprofen '800mg'$ ; prednisone Past analgesics:  Tramadol Past abortive triptans: Maxalt MLT '10mg'$ ; sumatriptan '20mg'$  NS, Tosymra NS (helpful), eletriptan Past abortive ergotamine:  none Past muscle relaxants:  Flexeril Past anti-emetic:  none Past antihypertensive medications:  Labetolol; lisinopril-HCTZ Past antidepressant medications:  Doxepin '10mg'$  BID (hives); fluoxetine '10mg'$  Past anticonvulsant medications:  zonisamide '100mg'$  daily; topiramate (stopped due to potential memory problems), gabapentin Past anti-CGRP:  none Past vitamins/Herbal/Supplements:  none Past antihistamines/decongestants:  none Other past therapies:  none    Family history of headache:  Aunt (used to have migraines)  PAST MEDICAL HISTORY: Past Medical History:  Diagnosis Date   Acute otitis media 08/28/2012   improved  change to liquid medication    Breast abscess of female    Recurrent   Family history of adverse reaction to anesthesia    father hard time waking once (12/20/2016)   Fibroids    w/bleeding   GERD (gastroesophageal reflux  disease)    History of blood transfusion    after surgery   Hypertension    Migraines    Dr. Orie Rout; "I have a few/month" (12/20/2016)   Osteoarthritis    Pill esophagitis 08/28/2012   amoxicillin  by  hx  disc plan nl voice except hoarse not drooling  close fu  if not getting better with plan stop aleve  liquid ibu onlyf necessary    Recurrent periodic urticaria    Rheumatoid arthritis ([place])    "55% of my body" (12/20/2016)   Rheumatoid arthritis ([place])    Sleep apnea    wears cpap    MEDICATIONS: Current Outpatient Medications on File Prior to Visit  Medication Sig Dispense Refill   albuterol (VENTOLIN HFA) 108 (90 Base) MCG/ACT inhaler TAKE 2 PUFFS BY MOUTH EVERY 6 HOURS AS NEEDED FOR WHEEZE OR SHORTNESS OF BREATH     amLODipine (NORVASC) 2.5 MG tablet Take 1 tablet (2.5 mg total) by mouth daily. In addition to 5 mg amlodipine ( total 7.5 mg per day) 90 tablet 1   certolizumab pegol (CIMZIA) 2 X 200 MG KIT Inject 400 mg into the skin every 28 (twenty-eight) days. (Patient not taking: Reported on 06/06/2022)     cetirizine (ZYRTEC) 10 MG tablet Take 10 mg by mouth daily as needed for allergies.     Fremanezumab-vfrm (AJOVY) 225 MG/1.5ML SOAJ Inject 225 mg into the skin every 28 (twenty-eight) days. (Patient not taking: Reported on 06/06/2022) 1.68 mL 11   ibuprofen (ADVIL) 800 MG tablet Take 1 tablet (800 mg total) by mouth every 8 (eight) hours as needed (pain). 21 tablet 0   ipratropium (ATROVENT) 0.03 % nasal spray Place 2 sprays into both nostrils 2 (two) times daily. 30 mL 0   levocetirizine (XYZAL) 5 MG tablet Take 1 tablet (5 mg total) by mouth every evening. 30 tablet 0   Levonorgestrel (KYLEENA) 19.5 MG IUD 19.5 mg by Intrauterine route once.     methocarbamol (ROBAXIN) 500 MG tablet Take 1 tablet (500 mg total) by mouth every 8 (eight) hours as needed for muscle spasms. 20 tablet 0   NON FORMULARY Pt uses cpap nightly     prednisoLONE acetate (PRED FORTE) 1 % ophthalmic suspension Place 1 drop into the left eye 2 (two) times daily. As needed     promethazine-dextromethorphan (PROMETHAZINE-DM) 6.25-15 MG/5ML syrup Take 5 mLs by mouth 3 (three) times daily as needed for cough. 200 mL 0    ramelteon (ROZEREM) 8 MG tablet TAKE 1 TABLET BY MOUTH AT BEDTIME. (Patient taking differently: at bedtime as needed.) 90 tablet 1   SUMAtriptan (TOSYMRA) 10 MG/ACT SOLN Place 10 mg into the nose once as needed for up to 1 dose. May repeat after 1 hour.  Maximum 2 sprays in 24 hours. 6 each 11   triamcinolone (NASACORT) 55 MCG/ACT AERO nasal inhaler Nasal Allergy 55 mcg spray aerosol  INHALE 2 SPRAYS IN EACH NOSTRIL AT NIGHT (Patient not taking: Reported on 06/06/2022)     [DISCONTINUED] eletriptan (RELPAX) 40 MG tablet Take 1 tablet (40 mg total) by mouth as needed for migraine or headache. May repeat in 2 hours if headache persists or recurs. 10 tablet 1   No current facility-administered medications on file prior to visit.     ALLERGIES: Allergies  Allergen Reactions   Lisinopril Anaphylaxis   Lisinopril-Hydrochlorothiazide Swelling and Other (See Comments)    Angioedema  Has tolerated maxide in past so most likely  The ACE inhibitor  as the cause     FAMILY HISTORY: Family History  Problem Relation Age of Onset   Hypertension Mother    Rheum arthritis Mother    Hypertension Father    Sleep apnea Father    Breast cancer Neg Hx       Objective:  *** General: No acute distress.  Patient appears well-groomed.   Head:  Normocephalic/atraumatic Eyes:  Fundi examined but not visualized Neck: supple, no paraspinal tenderness, full range of motion Heart:  Regular rate and rhythm Neurological Exam: ***   Metta Clines, DO  CC: Shanon Ace, MD

## 2022-10-17 ENCOUNTER — Encounter: Payer: Self-pay | Admitting: Neurology

## 2022-10-17 ENCOUNTER — Ambulatory Visit (INDEPENDENT_AMBULATORY_CARE_PROVIDER_SITE_OTHER): Payer: Managed Care, Other (non HMO) | Admitting: Neurology

## 2022-10-17 VITALS — BP 154/94 | HR 82 | Ht 64.0 in | Wt 254.0 lb

## 2022-10-17 DIAGNOSIS — G43009 Migraine without aura, not intractable, without status migrainosus: Secondary | ICD-10-CM | POA: Diagnosis not present

## 2022-10-17 MED ORDER — TOSYMRA 10 MG/ACT NA SOLN: 10.0000 mg | Freq: Once | NASAL | 11 refills | Status: DC | PRN

## 2022-10-17 NOTE — Patient Instructions (Signed)
Contact your insurance and ask which of following medications are formulary and let me know:  Emgality, Ajovy, Qulipta Refilled Tosymra -sent to [facility] Limit use of pain relievers (over the counter and Tosymra) to no more than 2 days out of week to prevent risk of rebound or medication-overuse headache. Follow up 4 to 5 months.

## 2022-10-20 NOTE — Telephone Encounter (Signed)
Pre certification for Cimzia in-office injections faxed to [facility]  Fax: [phone] Phone: [phone]  Knox Saliva, PharmD, MPH, BCPS, CPP Clinical Pharmacist (Rheumatology and Pulmonology)

## 2022-10-20 NOTE — Progress Notes (Signed)
Office Visit Note  Patient: Jennifer [name]             Date of Birth: Aug 13, 1976           MRN: [id number]             PCP: Burnis Medin, MD Referring: Burnis Medin, MD Visit Date: 11/02/2022 Occupation: @GUAROCC$ @  Subjective:  Pain in both knees   History of Present Illness: Jennifer [name] is a 47 y.o. female with history of seropositive rheumatoid arthritis, iritis, and osteoarthritis.  Patient prescribed Cimzia 400 mg sq injections in the office every 28 days. Her last dose of cimzia was administered on 08/29/22.  Patient has had a gap in therapy due to a recent infection as well as 4 deaths this past winter.  Patient states that she continues to have intermittent soreness and even her right knee replacement.  She would like to reapply for Orthovisc injections for the left knee joint.  She denies any joint swelling at this time.  She states that overall her rheumatoid arthritis remains stable.  She does not currently have an infection.  She denies any underlying medical conditions.  She is ready to resume Cimzia as prescribed.     Activities of Daily Living:  Patient reports morning stiffness for 1 hour.   Patient Reports nocturnal pain.  Difficulty dressing/grooming: Denies Difficulty climbing stairs: Denies Difficulty getting out of chair: Denies Difficulty using hands for taps, buttons, cutlery, and/or writing: Denies  Review of Systems  Constitutional:  Positive for fatigue.  HENT: Negative.  Negative for mouth sores and mouth dryness.   Eyes: Negative.  Negative for dryness.  Respiratory: Negative.  Negative for shortness of breath.   Cardiovascular: Negative.  Negative for chest pain and palpitations.  Gastrointestinal: Negative.  Negative for blood in stool, constipation and diarrhea.  Endocrine: Negative.  Negative for increased urination.  Genitourinary: Negative.  Negative for involuntary urination.  Musculoskeletal:  Positive for morning stiffness. Negative for  joint pain, gait problem, joint pain, joint swelling, myalgias, muscle weakness, muscle tenderness and myalgias.  Skin: Negative.  Negative for color change, rash, hair loss and sensitivity to sunlight.  Allergic/Immunologic: Negative.  Negative for susceptible to infections.  Neurological:  Positive for headaches. Negative for dizziness.  Hematological: Negative.  Negative for swollen glands.  Psychiatric/Behavioral:  Positive for sleep disturbance. Negative for depressed mood. The patient is not nervous/anxious.     PMFS History:  Patient Active Problem List   Diagnosis Date Noted   OSA (obstructive sleep apnea) 02/23/2022   Morbid obesity ([place]) 02/23/2022   Primary osteoarthritis of right knee 03/28/2021   OA (osteoarthritis) of knee 04/02/2020   Pain in right knee 01/13/2020   Chronic pain of right knee 01/21/2019   S/P right knee arthroscopy 07/23/2018   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   Primary osteoarthritis of both hands 02/04/2018   Sleep apnea, obstructive 09/28/2017   Primary osteoarthritis of both knees 05/22/2017   Incisional hernia 12/18/2016   Leiomyoma of uterus 09/06/2016   Fibroid, uterine    Essential hypertension 05/25/2015   Recurrent headache 05/25/2015   Head lump 07/31/2014   Edema 12/29/2013   Baker's cyst, ruptured 12/25/2013   Cough, persistent 12/12/2013   Left leg swelling 12/12/2013   Cough 12/12/2013   High risk medication use 12/12/2013   Recurrent periodic urticaria    Rheumatoid arthritis ([place]) 08/28/2012   CHRONIC LARYNGITIS 11/03/2010   ALLERGIC RHINITIS 11/03/2010   PARESTHESIA 07/28/2010  DYSMENORRHEA 10/15/2007   FIBROIDS, UTERUS 07/24/2007   OBESITY 07/24/2007   SLEEPLESSNESS 07/24/2007   HEADACHE 07/24/2007    Past Medical History:  Diagnosis Date   Acute otitis media 08/28/2012   improved  change to liquid medication    Breast abscess of female    Recurrent   Family history of adverse reaction to anesthesia    father hard  time waking once (12/20/2016)   Fibroids    w/bleeding   GERD (gastroesophageal reflux disease)    History of blood transfusion    after surgery   Hypertension    Migraines    Dr. Orie Rout; "I have a few/month" (12/20/2016)   Osteoarthritis    Pill esophagitis 08/28/2012   amoxicillin  by hx  disc plan nl voice except hoarse not drooling  close fu  if not getting better with plan stop aleve  liquid ibu onlyf necessary    Recurrent periodic urticaria    Rheumatoid arthritis ([place])    "55% of my body" (12/20/2016)   Rheumatoid arthritis ([place])    Sleep apnea    wears cpap    Family History  Problem Relation Age of Onset   Hypertension Mother    Rheum arthritis Mother    Hypertension Father    Sleep apnea Father    Breast cancer Neg Hx    Past Surgical History:  Procedure Laterality Date   BREAST SURGERY     abcess under left breast    CYST EXCISION Left 2014   "elbow"   HERNIA REPAIR     INCISION AND DRAINAGE BREAST ABSCESS Left 2003   INCISIONAL HERNIA REPAIR N/A 12/18/2016   Procedure: REPAIR INCISIONAL HERNIA WITH MESH;  Surgeon: Georganna Skeans, MD;  Location: [place];  Service: General;  Laterality: N/A;   INSERTION OF MESH N/A 12/18/2016   Procedure: INSERTION OF MESH;  Surgeon: Georganna Skeans, MD;  Location: [place];  Service: General;  Laterality: N/A;   KNEE ARTHROSCOPY WITH MENISCAL REPAIR Right 06/17/2018   Procedure: RIGHT KNEE ARTHROSCOPY WITH MENISCAL REPAIR;  Surgeon: Vickey Huger, MD;  Location: [facility];  Service: Orthopedics;  Laterality: Right;   MYOMECTOMY N/A 09/06/2016   Procedure: Lendell Caprice;  Surgeon: Governor Specking, MD;  Location: [facility];  Service: Gynecology;  Laterality: N/A;   ROOT CANAL  05/2022   TOTAL KNEE ARTHROPLASTY Right 03/28/2021   Procedure: TOTAL KNEE ARTHROPLASTY;  Surgeon: Gaynelle Arabian, MD;  Location: [facility];  Service: Orthopedics;  Laterality: Right;  62mn   UTERINE FIBROID SURGERY     35 fibroids   Social History    Social History Narrative   Not on file   Immunization History  Administered Date(s) Administered   Influenza Split 08/21/2012   Influenza,inj,Quad PF,6+ Mos 07/31/2014, 08/19/2019   Pneumococcal Conjugate-13 08/28/2012     Objective: Vital Signs: BP (!) 142/92 (BP Location: Left Arm, Patient Position: Sitting, Cuff Size: Large)   Pulse 66   Resp 18   Ht 5' 4"$  (1.626 m)   Wt 258 lb (117 kg)   BMI 44.29 kg/m    Physical Exam Vitals and nursing note reviewed.  Constitutional:      Appearance: She is well-developed.  HENT:     Head: Normocephalic and atraumatic.  Eyes:     Conjunctiva/sclera: Conjunctivae normal.  Cardiovascular:     Rate and Rhythm: Normal rate and regular rhythm.     Heart sounds: Normal heart sounds.  Pulmonary:     Effort: Pulmonary effort is  normal.     Breath sounds: Normal breath sounds.  Abdominal:     General: Bowel sounds are normal.     Palpations: Abdomen is soft.  Musculoskeletal:     Cervical back: Normal range of motion.  Skin:    General: Skin is warm and dry.     Capillary Refill: Capillary refill takes less than 2 seconds.  Neurological:     Mental Status: She is alert and oriented to person, place, and time.  Psychiatric:        Behavior: Behavior normal.      Musculoskeletal Exam: C-spine, thoracic spine, and lumbar spine good ROM.  No midline spinal tenderness.  Shoulder joints, elbow joints, wrist joints, MCPs, PIPs, and DIPs good ROM with no synovitis.  Complete fist formation bilaterally.  Hip joints have good ROM.  Left knee has slightly limited extension.  Right knee replacement has warmth but no effusion.No tenderness or swelling of ankle joints.    CDAI Exam: CDAI Score: -- Patient Global: 3 mm; Provider Global: 3 mm Swollen: --; Tender: -- Joint Exam 11/02/2022   No joint exam has been documented for this visit   There is currently no information documented on the homunculus. Go to the Rheumatology activity and  complete the homunculus joint exam.  Investigation: No additional findings.  Imaging: No results found.  Recent Labs: Lab Results  Component Value Date   WBC 4.6 06/06/2022   HGB 12.5 06/06/2022   PLT 228 06/06/2022   NA 142 06/06/2022   K 4.6 06/06/2022   CL 105 06/06/2022   CO2 28 06/06/2022   GLUCOSE 82 06/06/2022   BUN 8 06/06/2022   CREATININE 0.76 06/06/2022   BILITOT 0.6 06/06/2022   ALKPHOS 67 03/16/2021   AST 20 06/06/2022   ALT 12 06/06/2022   PROT 8.0 06/06/2022   ALBUMIN 3.8 03/16/2021   CALCIUM 9.5 06/06/2022   GFRAA 84 07/02/2020   QFTBGOLDPLUS NEGATIVE 06/06/2022    Speciality Comments: Prior therapy: Plaquenil (inadequate response)    Patient is needs to be seen in the office every 3 months to get Cimzia injections.  Procedures:  No procedures performed Allergies: Lisinopril and Lisinopril-hydrochlorothiazide    Assessment / Plan:     Visit Diagnoses: Rheumatoid arthritis involving multiple sites with positive rheumatoid factor (HCC) - +CCP, +HLA B27: She has no joint tenderness or synovitis on examination today.  She has not had any signs or symptoms of a rheumatoid arthritis flare.  Her last dose of in office administered Cimzia was administered on 08/29/2022.  She has had a gap in therapy due to recent infection as well as several deaths this winter.  She is overdue to update lab work.  CBC and CMP were updated today.  1 Cimzia has been approved through her insurance she will return to the office for administration.  Overall she continues to find Cimzia to be effective at managing her rheumatoid arthritis.  She has not had any signs or symptoms of an iritis flare.  She was advised to notify us if she develops any new or worsening symptoms.  She will follow-up in the office in 5 months or sooner if needed.  High risk medication use - Cimzia 400 mg sq injections in the office every 28 days.  (Rasuvo 20 mg sq injections once weekly and folic acid 1 mg 2  tablets daily.-Patient discontinued due to hair loss) CBC and CMP updated on 06/06/22. Orders for CBC and CMP released today.  Her next  lab work will be due in May and every 3 months.  TB gold negative on 06/06/22.  Discussed the importance of holding cimzia if she develops signs or symptoms of an infection and to resume once the infection has completely cleared.   - Plan: CBC with Differential/Platelet, COMPLETE METABOLIC PANEL WITH GFR  Iritis - No signs or symptoms of an irits flare.  No eye pain or photophobia.  Pain in right wrist: Resolved.  Good ROM with no tenderness or synovitis.    Primary osteoarthritis of both hands: No tenderness or synovitis noted.  Primary osteoarthritis of left knee: She continues to experience intermittent pain and stiffness in the left knee joint.  She underwent Orthovisc injections in November 2022 which have provided significant relief.  Plan to reapply for orthovisc injections for the left knee.   This patient is diagnosed with osteoarthritis of the left knee    Radiographs show evidence of joint space narrowing, osteophytes, subchondral sclerosis and/or subchondral cysts.  This patient has knee pain which interferes with functional and activities of daily living.    This patient has experienced inadequate response, adverse effects and/or intolerance with conservative treatments such as acetaminophen, NSAIDS, topical creams, physical therapy or regular exercise, knee bracing and/or weight loss.   This patient has experienced inadequate response or has a contraindication to intra articular steroid injections for at least 3 months.   This patient is not scheduled to have a total knee replacement within 6 months of starting treatment with viscosupplementation.  S/P total knee arthroplasty, right: 03/28/21-Doing well.  She experiences intermittent soreness and ongoing warmth but no effusion.    Essential hypertension: BP was 145/91 today in the office.    Lipid screening -Lipid panel checked today.  She is currently fasting.  Results will be forwarded to Dr. Regis Bill as requested.  Plan: Lipid panel  Diabetes mellitus screening - Patient requested to have hemoglobin A1c checked today.  Plan: HgB A1c  Orders: Orders Placed This Encounter  Procedures   CBC with Differential/Platelet   COMPLETE METABOLIC PANEL WITH GFR   Lipid panel   HgB A1c   No orders of the defined types were placed in this encounter.    Follow-Up Instructions: Return in about 3 months (around 01/31/2023) for Rheumatoid arthritis, Iritis, OA.   Ofilia Neas, PA-C  Note - This record has been created using Dragon software.  Chart creation errors have been sought, but may not always  have been located. Such creation errors do not reflect on  the standard of medical care.

## 2022-10-20 NOTE — Telephone Encounter (Signed)
Pre certification for Cimzia in-office injections faxed to [place]   Fax: [phone] Phone: [phone]   Knox Saliva, PharmD, MPH, BCPS, CPP Clinical Pharmacist (Rheumatology and Pulmonology)

## 2022-10-25 ENCOUNTER — Telehealth: Payer: Self-pay | Admitting: Neurology

## 2022-10-25 NOTE — Telephone Encounter (Signed)
[facility] called in and left a message with the access nurse. They need to get clarification on the sumatriptan nasal spray. Instructions aren't clear.   Full access nurse report is in Dr. Georgie Chard box

## 2022-10-25 NOTE — Telephone Encounter (Signed)
[facility] advised of Dr.Jaffe note, 1 spray as needed.  May repeat after 1 hour.  Maximum 2 sprays in 24 hours

## 2022-10-26 ENCOUNTER — Encounter: Payer: Self-pay | Admitting: Gastroenterology

## 2022-10-26 NOTE — Telephone Encounter (Signed)
Per verification of benefits for Cimzia, patient's [employer] plan is active. [place] is in-network. Patient has 20% coinsurance for Cimzia. Individual deductible is $3200 and out-of-pocket maximum is $5000 for individual and $10000 for family. Precertification is required for [place] for update on patient's Cimzia pre-certification. Per rep, case is still in review. She said we can call back early next week to check for status update  Phone: [phone] Case ID: [id number]  Knox Saliva, PharmD, MPH, BCPS, CPP Clinical Pharmacist (Rheumatology and Pulmonology)

## 2022-11-01 NOTE — Telephone Encounter (Signed)
Called [employer] for an update on patient's Cimzia pre-certification. Per rep, the case is in the last step of review - "the medical director's work basket." Unable to provide an time estimate for determination at this time. She advised we call back at the end of the week for an update.  Maryan Puls, PharmD PGY-1 [facility] Pharm[facility] Resident

## 2022-11-02 ENCOUNTER — Telehealth: Payer: Self-pay | Admitting: *Deleted

## 2022-11-02 ENCOUNTER — Ambulatory Visit: Payer: Managed Care, Other (non HMO) | Attending: Physician Assistant | Admitting: Physician Assistant

## 2022-11-02 ENCOUNTER — Encounter: Payer: Self-pay | Admitting: Physician Assistant

## 2022-11-02 VITALS — BP 142/92 | HR 66 | Resp 18 | Ht 64.0 in | Wt 258.0 lb

## 2022-11-02 DIAGNOSIS — M19042 Primary osteoarthritis, left hand: Secondary | ICD-10-CM

## 2022-11-02 DIAGNOSIS — Z79899 Other long term (current) drug therapy: Secondary | ICD-10-CM | POA: Diagnosis not present

## 2022-11-02 DIAGNOSIS — Z131 Encounter for screening for diabetes mellitus: Secondary | ICD-10-CM

## 2022-11-02 DIAGNOSIS — Z9889 Other specified postprocedural states: Secondary | ICD-10-CM

## 2022-11-02 DIAGNOSIS — M25531 Pain in right wrist: Secondary | ICD-10-CM | POA: Diagnosis not present

## 2022-11-02 DIAGNOSIS — M1712 Unilateral primary osteoarthritis, left knee: Secondary | ICD-10-CM

## 2022-11-02 DIAGNOSIS — M0579 Rheumatoid arthritis with rheumatoid factor of multiple sites without organ or systems involvement: Secondary | ICD-10-CM | POA: Diagnosis not present

## 2022-11-02 DIAGNOSIS — I10 Essential (primary) hypertension: Secondary | ICD-10-CM

## 2022-11-02 DIAGNOSIS — H209 Unspecified iridocyclitis: Secondary | ICD-10-CM

## 2022-11-02 DIAGNOSIS — Z13220 Encounter for screening for lipoid disorders: Secondary | ICD-10-CM

## 2022-11-02 DIAGNOSIS — M170 Bilateral primary osteoarthritis of knee: Secondary | ICD-10-CM

## 2022-11-02 DIAGNOSIS — M19041 Primary osteoarthritis, right hand: Secondary | ICD-10-CM

## 2022-11-02 DIAGNOSIS — Z96651 Presence of right artificial knee joint: Secondary | ICD-10-CM

## 2022-11-02 NOTE — Telephone Encounter (Signed)
VOB submitted for Euflexxa, Left knee(s) BV pending

## 2022-11-02 NOTE — Telephone Encounter (Signed)
Please apply for orthovisc left knee. Thank you.

## 2022-11-03 LAB — CBC WITH DIFFERENTIAL/PLATELET
Absolute Monocytes: 428 cells/uL (ref 200–950)
Basophils Absolute: 20 cells/uL (ref 0–200)
Basophils Relative: 0.5 %
Eosinophils Absolute: 160 cells/uL (ref 15–500)
Eosinophils Relative: 4 %
HCT: 37.5 % (ref 35.0–45.0)
Hemoglobin: 12.5 g/dL (ref 11.7–15.5)
Lymphs Abs: 1924 cells/uL (ref 850–3900)
MCH: 30.1 pg (ref 27.0–33.0)
MCHC: 33.3 g/dL (ref 32.0–36.0)
MCV: 90.4 fL (ref 80.0–100.0)
MPV: 9.9 fL (ref 7.5–12.5)
Monocytes Relative: 10.7 %
Neutro Abs: 1468 cells/uL — ABNORMAL LOW (ref 1500–7800)
Neutrophils Relative %: 36.7 %
Platelets: 315 10*3/uL (ref 140–400)
RBC: 4.15 10*6/uL (ref 3.80–5.10)
RDW: 12.3 % (ref 11.0–15.0)
Total Lymphocyte: 48.1 %
WBC: 4 10*3/uL (ref 3.8–10.8)

## 2022-11-03 LAB — LIPID PANEL
Cholesterol: 173 mg/dL (ref <200)
HDL: 80 mg/dL (ref >=50)
LDL Cholesterol (Calc): 82 mg/dL (calc)
Non-HDL Cholesterol (Calc): 93 mg/dL (calc) (ref <130)
Total CHOL/HDL Ratio: 2.2 (calc) (ref <5.0)
Triglycerides: 37 mg/dL (ref <150)

## 2022-11-03 LAB — COMPLETE METABOLIC PANEL WITH GFR
AG Ratio: 0.9 (calc) — ABNORMAL LOW (ref 1.0–2.5)
ALT: 9 U/L (ref 6–29)
AST: 14 U/L (ref 10–35)
Albumin: 3.9 g/dL (ref 3.6–5.1)
Alkaline phosphatase (APISO): 87 U/L (ref 31–125)
BUN: 15 mg/dL (ref 7–25)
CO2: 29 mmol/L (ref 20–32)
Calcium: 9.3 mg/dL (ref 8.6–10.2)
Chloride: 105 mmol/L (ref 98–110)
Creat: 0.82 mg/dL (ref 0.50–0.99)
Globulin: 4.3 g/dL (calc) — ABNORMAL HIGH (ref 1.9–3.7)
Glucose, Bld: 94 mg/dL (ref 65–99)
Potassium: 4.6 mmol/L (ref 3.5–5.3)
Sodium: 140 mmol/L (ref 135–146)
Total Bilirubin: 0.5 mg/dL (ref 0.2–1.2)
Total Protein: 8.2 g/dL — ABNORMAL HIGH (ref 6.1–8.1)
eGFR: 89 mL/min/{1.73_m2} (ref >=60)

## 2022-11-03 LAB — HEMOGLOBIN A1C W/OUT EAG: Hgb A1c MFr Bld: 5.6 % of total Hgb (ref <5.7)

## 2022-11-03 NOTE — Progress Notes (Signed)
Lipid panel WNL.  Hgb A1c 5.6-within desirable range.  CBC WNL.  Total protein is borderline elevated. Rest of CMP WNL.  We will continue to monitor.   Please forward lab work to PCP as requested by the patient.

## 2022-11-08 ENCOUNTER — Ambulatory Visit: Payer: Managed Care, Other (non HMO) | Attending: Rheumatology | Admitting: *Deleted

## 2022-11-08 VITALS — BP 154/93 | HR 82

## 2022-11-08 DIAGNOSIS — M0579 Rheumatoid arthritis with rheumatoid factor of multiple sites without organ or systems involvement: Secondary | ICD-10-CM | POA: Diagnosis not present

## 2022-11-08 MED ORDER — CERTOLIZUMAB PEGOL 2 X 200 MG ~~LOC~~ KIT
400.0000 mg | PACK | Freq: Once | SUBCUTANEOUS | Status: AC
  Administered 2022-11-08: 400 mg via SUBCUTANEOUS

## 2022-11-08 NOTE — Progress Notes (Signed)
Pharmacy Note  Subjective:   Patient presents to clinic today to receive monthly dose of Cimzia.  Patient running a fever or have signs/symptoms of infection? No  Patient currently on antibiotics for the treatment of infection? No  Patient have any upcoming invasive procedures/surgeries? No  Objective: CMP     Component Value Date/Time   NA 140 11/02/2022 0819   NA 138 06/11/2018 1212   K 4.6 11/02/2022 0819   CL 105 11/02/2022 0819   CO2 29 11/02/2022 0819   GLUCOSE 94 11/02/2022 0819   BUN 15 11/02/2022 0819   BUN 9 06/11/2018 1212   CREATININE 0.82 11/02/2022 0819   CALCIUM 9.3 11/02/2022 0819   PROT 8.2 (H) 11/02/2022 0819   PROT 8.0 06/11/2018 1212   ALBUMIN 3.8 03/16/2021 1417   ALBUMIN 3.8 06/11/2018 1212   AST 14 11/02/2022 0819   ALT 9 11/02/2022 0819   ALKPHOS 67 03/16/2021 1417   BILITOT 0.5 11/02/2022 0819   BILITOT 0.3 06/11/2018 1212   GFRNONAA >60 03/30/2021 0310   GFRNONAA 72 07/02/2020 1106   GFRAA 84 07/02/2020 1106    CBC    Component Value Date/Time   WBC 4.0 11/02/2022 0819   RBC 4.15 11/02/2022 0819   HGB 12.5 11/02/2022 0819   HGB 11.9 06/11/2018 1212   HCT 37.5 11/02/2022 0819   HCT 36.6 06/11/2018 1212   PLT 315 11/02/2022 0819   PLT 310 06/11/2018 1212   MCV 90.4 11/02/2022 0819   MCV 93 06/11/2018 1212   MCH 30.1 11/02/2022 0819   MCHC 33.3 11/02/2022 0819   RDW 12.3 11/02/2022 0819   RDW 14.4 06/11/2018 1212   LYMPHSABS 1,924 11/02/2022 0819   LYMPHSABS 2.3 06/11/2018 1212   MONOABS 0.5 02/28/2021 1614   EOSABS 160 11/02/2022 0819   EOSABS 0.2 06/11/2018 1212   BASOSABS 20 11/02/2022 0819   BASOSABS 0.0 06/11/2018 1212    Baseline Immunosuppressant Therapy Labs TB GOLD    Latest Ref Rng & Units 06/06/2022    9:09 AM  Quantiferon TB Gold  Quantiferon TB Gold Plus NEGATIVE NEGATIVE    Hepatitis Panel    Latest Ref Rng & Units 06/13/2019    2:46 PM  Hepatitis  Hep B Surface Ag NON-REACTI NON-REACTIVE   Hep B IgM  NON-REACTI NON-REACTIVE   Hep C Ab NON-REACTI NON-REACTIVE   Hep A IgM NON-REACTI NON-REACTIVE    HIV Lab Results  Component Value Date   HIV NON-REACTIVE 02/18/2020   Immunoglobulins    Latest Ref Rng & Units 02/18/2020    9:04 AM  Immunoglobulin Electrophoresis  IgA  47 - 310 mg/dL 651   IgG 600 - 1,640 mg/dL 2,218   IgM 50 - 300 mg/dL 78    SPEP    Latest Ref Rng & Units 11/02/2022    8:19 AM  Serum Protein Electrophoresis  Total Protein 6.1 - 8.1 g/dL 8.2    G6PD No results found for: "G6PDH" TPMT No results found for: "TPMT"   Chest x-ray: 01/27/2022 No acute cardiopulmonary process    Assessment/Plan:   Administrations This Visit     certolizumab pegol (CIMZIA) kit 400 mg     Admin Date 11/08/2022 Action Given Dose 400 mg Route Subcutaneous Administered By Carole Binning, LPN             Patient tolerated injection well.   Appointment for next injection scheduled for 12/06/2022.  Patient due for labs in May 2024.  Patient is to call  and reschedule appointment if running a fever with signs/symptoms of infection, on antibiotics for active infection or has an upcoming invasive procedure.  All questions encouraged and answered.  Instructed patient to call with any further questions or concerns.

## 2022-11-08 NOTE — Telephone Encounter (Signed)
Called [employer] for an update on patient's Cimzia pre-certification. The intial rep stated that the application is still in process and she would need to transfer the call to provide any additional insight into the status.  Remained on hold for 30+ minutes for status update. Pharmacy team will have to call back for a status update.  Maryan Puls, PharmD PGY-1 [facility] Pharmacy Resident

## 2022-11-14 NOTE — Telephone Encounter (Signed)
PA submitted for Orthovisc PA pending

## 2022-11-16 NOTE — Telephone Encounter (Signed)
Please call to schedule visco injections.  Approved for Euflexxa, Left knee(s). [place] Covered at 80% of the allowed amount after deductible is met (Met: $780.82/$3200) No co-pay Deductible does apply Auth#: [id number] Valid: 10/17/22 to 12/16/22

## 2022-11-16 NOTE — Telephone Encounter (Signed)
Received notification from [place] regarding a pre-certification for in-office CIMZIA. Authorization has been APPROVED from 10/20/2022 to 10/19/2023. Approval letter sent to scan center.  Authorization # [id number]  Patient can be scheduled for Cimzia appt  Knox Saliva, PharmD, MPH, BCPS, CPP Clinical Pharmacist (Rheumatology and Pulmonology)

## 2022-11-23 ENCOUNTER — Ambulatory Visit: Payer: Managed Care, Other (non HMO) | Attending: Physician Assistant | Admitting: Physician Assistant

## 2022-11-23 DIAGNOSIS — L732 Hidradenitis suppurativa: Secondary | ICD-10-CM | POA: Insufficient documentation

## 2022-11-23 DIAGNOSIS — M1712 Unilateral primary osteoarthritis, left knee: Secondary | ICD-10-CM

## 2022-11-23 MED ORDER — LIDOCAINE HCL 1 % IJ SOLN
1.5000 mL | INTRAMUSCULAR | Status: AC | PRN
  Administered 2022-11-23: 1.5 mL

## 2022-11-23 MED ORDER — SODIUM HYALURONATE (VISCOSUP) 20 MG/2ML IX SOSY
20.0000 mg | PREFILLED_SYRINGE | INTRA_ARTICULAR | Status: AC | PRN
  Administered 2022-11-23: 20 mg via INTRA_ARTICULAR

## 2022-11-23 NOTE — Progress Notes (Signed)
   Procedure Note  Patient: Jennifer [name]             Date of Birth: 1976-08-05           MRN: [id number]             Visit Date: 11/23/2022  Procedures: Visit Diagnoses:  1. Primary osteoarthritis of left knee    Euflexxa #1 Left knee injection  Large Joint Inj: L knee on 11/23/2022 12:58 PM Indications: pain Details: 27 G 1.5 in needle, medial approach  Arthrogram: No  Medications: 1.5 mL lidocaine 1 %; 20 mg Sodium Hyaluronate (Viscosup) 20 MG/2ML Aspirate: 0 mL Outcome: tolerated well, no immediate complications Procedure, treatment alternatives, risks and benefits explained, specific risks discussed. Consent was given by the patient. Immediately prior to procedure a time out was called to verify the correct patient, procedure, equipment, support staff and site/side marked as required. Patient was prepped and draped in the usual sterile fashion.      Patient tolerated the procedure well.  Aftercare was discussed.  Hazel Sams, PA-C

## 2022-11-30 ENCOUNTER — Ambulatory Visit: Payer: Managed Care, Other (non HMO) | Attending: Physician Assistant | Admitting: Physician Assistant

## 2022-11-30 DIAGNOSIS — M1712 Unilateral primary osteoarthritis, left knee: Secondary | ICD-10-CM | POA: Diagnosis not present

## 2022-11-30 MED ORDER — SODIUM HYALURONATE (VISCOSUP) 20 MG/2ML IX SOSY
20.0000 mg | PREFILLED_SYRINGE | INTRA_ARTICULAR | Status: AC | PRN
  Administered 2022-11-30: 20 mg via INTRA_ARTICULAR

## 2022-11-30 MED ORDER — LIDOCAINE HCL 1 % IJ SOLN
1.5000 mL | INTRAMUSCULAR | Status: AC | PRN
  Administered 2022-11-30: 1.5 mL

## 2022-11-30 NOTE — Progress Notes (Signed)
   Procedure Note  Patient: [name]             Date of Birth: 02/23/1976           MRN: [id number]             Visit Date: 11/30/2022  Procedures: Visit Diagnoses:  1. Primary osteoarthritis of left knee    Euflexxa #2 Left knee joint injection  Large Joint Inj: L knee on 11/30/2022 2:38 PM Indications: pain Details: 27 G 1.5 in needle, medial approach  Arthrogram: No  Medications: 1.5 mL lidocaine 1 %; 20 mg Sodium Hyaluronate (Viscosup) 20 MG/2ML Aspirate: 0 mL Outcome: tolerated well, no immediate complications Procedure, treatment alternatives, risks and benefits explained, specific risks discussed. Consent was given by the patient. Immediately prior to procedure a time out was called to verify the correct patient, procedure, equipment, support staff and site/side marked as required. Patient was prepped and draped in the usual sterile fashion.       Patient tolerated the procedure well.  Aftercare was discussed.  Hazel Sams, PA-C

## 2022-12-01 ENCOUNTER — Ambulatory Visit (AMBULATORY_SURGERY_CENTER): Payer: Managed Care, Other (non HMO)

## 2022-12-01 ENCOUNTER — Encounter: Payer: Self-pay | Admitting: Gastroenterology

## 2022-12-01 VITALS — Ht 64.0 in | Wt 238.0 lb

## 2022-12-01 DIAGNOSIS — Z1211 Encounter for screening for malignant neoplasm of colon: Secondary | ICD-10-CM

## 2022-12-01 MED ORDER — NA SULFATE-K SULFATE-MG SULF 17.5-3.13-1.6 GM/177ML PO SOLN: 1.0000 | Freq: Once | ORAL | 0 refills | Status: AC

## 2022-12-01 NOTE — Progress Notes (Signed)

## 2022-12-06 ENCOUNTER — Ambulatory Visit: Payer: Managed Care, Other (non HMO)

## 2022-12-07 ENCOUNTER — Ambulatory Visit: Payer: Managed Care, Other (non HMO) | Attending: Physician Assistant | Admitting: Physician Assistant

## 2022-12-07 ENCOUNTER — Ambulatory Visit: Payer: Managed Care, Other (non HMO) | Admitting: *Deleted

## 2022-12-07 VITALS — BP 152/94 | HR 69

## 2022-12-07 DIAGNOSIS — M0579 Rheumatoid arthritis with rheumatoid factor of multiple sites without organ or systems involvement: Secondary | ICD-10-CM

## 2022-12-07 DIAGNOSIS — M1712 Unilateral primary osteoarthritis, left knee: Secondary | ICD-10-CM | POA: Diagnosis not present

## 2022-12-07 MED ORDER — SODIUM HYALURONATE (VISCOSUP) 20 MG/2ML IX SOSY
20.0000 mg | PREFILLED_SYRINGE | INTRA_ARTICULAR | Status: AC | PRN
  Administered 2022-12-07: 20 mg via INTRA_ARTICULAR

## 2022-12-07 MED ORDER — LIDOCAINE HCL 1 % IJ SOLN
1.5000 mL | INTRAMUSCULAR | Status: AC | PRN
  Administered 2022-12-07: 1.5 mL

## 2022-12-07 MED ORDER — CERTOLIZUMAB PEGOL 2 X 200 MG ~~LOC~~ KIT
400.0000 mg | PACK | Freq: Once | SUBCUTANEOUS | Status: AC
  Administered 2022-12-07: 400 mg via SUBCUTANEOUS

## 2022-12-07 NOTE — Progress Notes (Signed)
Subjective:   Patient presents to clinic today to receive monthly dose of Cimzia.  Patient running a fever or have signs/symptoms of infection? No  Patient currently on antibiotics for the treatment of infection? No  Patient have any upcoming invasive procedures/surgeries? No  Objective: CMP     Component Value Date/Time   NA 140 11/02/2022 0819   NA 138 06/11/2018 1212   K 4.6 11/02/2022 0819   CL 105 11/02/2022 0819   CO2 29 11/02/2022 0819   GLUCOSE 94 11/02/2022 0819   BUN 15 11/02/2022 0819   BUN 9 06/11/2018 1212   CREATININE 0.82 11/02/2022 0819   CALCIUM 9.3 11/02/2022 0819   PROT 8.2 (H) 11/02/2022 0819   PROT 8.0 06/11/2018 1212   ALBUMIN 3.8 03/16/2021 1417   ALBUMIN 3.8 06/11/2018 1212   AST 14 11/02/2022 0819   ALT 9 11/02/2022 0819   ALKPHOS 67 03/16/2021 1417   BILITOT 0.5 11/02/2022 0819   BILITOT 0.3 06/11/2018 1212   GFRNONAA >60 03/30/2021 0310   GFRNONAA 72 07/02/2020 1106   GFRAA 84 07/02/2020 1106    CBC    Component Value Date/Time   WBC 4.0 11/02/2022 0819   RBC 4.15 11/02/2022 0819   HGB 12.5 11/02/2022 0819   HGB 11.9 06/11/2018 1212   HCT 37.5 11/02/2022 0819   HCT 36.6 06/11/2018 1212   PLT 315 11/02/2022 0819   PLT 310 06/11/2018 1212   MCV 90.4 11/02/2022 0819   MCV 93 06/11/2018 1212   MCH 30.1 11/02/2022 0819   MCHC 33.3 11/02/2022 0819   RDW 12.3 11/02/2022 0819   RDW 14.4 06/11/2018 1212   LYMPHSABS 1,924 11/02/2022 0819   LYMPHSABS 2.3 06/11/2018 1212   MONOABS 0.5 02/28/2021 1614   EOSABS 160 11/02/2022 0819   EOSABS 0.2 06/11/2018 1212   BASOSABS 20 11/02/2022 0819   BASOSABS 0.0 06/11/2018 1212    Baseline Immunosuppressant Therapy Labs TB GOLD    Latest Ref Rng & Units 06/06/2022    9:09 AM  Quantiferon TB Gold  Quantiferon TB Gold Plus NEGATIVE NEGATIVE    Hepatitis Panel    Latest Ref Rng & Units 06/13/2019    2:46 PM  Hepatitis  Hep B Surface Ag NON-REACTI NON-REACTIVE   Hep B IgM NON-REACTI  NON-REACTIVE   Hep C Ab NON-REACTI NON-REACTIVE   Hep A IgM NON-REACTI NON-REACTIVE    HIV Lab Results  Component Value Date   HIV NON-REACTIVE 02/18/2020   Immunoglobulins    Latest Ref Rng & Units 02/18/2020    9:04 AM  Immunoglobulin Electrophoresis  IgA  47 - 310 mg/dL 651   IgG 600 - 1,640 mg/dL 2,218   IgM 50 - 300 mg/dL 78    SPEP    Latest Ref Rng & Units 11/02/2022    8:19 AM  Serum Protein Electrophoresis  Total Protein 6.1 - 8.1 g/dL 8.2    G6PD No results found for: "G6PDH" TPMT No results found for: "TPMT"   Chest x-ray: 01/27/2022 No acute cardiopulmonary process    Assessment/Plan:  Administrations This Visit     certolizumab pegol (CIMZIA) kit 400 mg     Admin Date 12/07/2022 Action Given Dose 400 mg Route Subcutaneous Administered By Carole Binning, LPN             Patient tolerated injection well.   Appointment for next injection scheduled for 01/04/2023.  Patient due for labs in May 2024.  Patient is to call and reschedule appointment if  running a fever with signs/symptoms of infection, on antibiotics for active infection or has an upcoming invasive procedure.  All questions encouraged and answered.  Instructed patient to call with any further questions or concerns.

## 2022-12-07 NOTE — Progress Notes (Signed)
   Procedure Note  Patient: Jennifer [name]             Date of Birth: 24-Sep-1975           MRN: [id number]             Visit Date: 12/07/2022  Procedures: Visit Diagnoses:  1. Primary osteoarthritis of left knee    Euflexxa #3 Left knee injection  Large Joint Inj: L knee on 12/07/2022 7:59 AM Indications: pain Details: 27 G 1.5 in needle, medial approach  Arthrogram: No  Medications: 1.5 mL lidocaine 1 %; 20 mg Sodium Hyaluronate (Viscosup) 20 MG/2ML Aspirate: 0 mL Outcome: tolerated well, no immediate complications Procedure, treatment alternatives, risks and benefits explained, specific risks discussed. Consent was given by the patient. Immediately prior to procedure a time out was called to verify the correct patient, procedure, equipment, support staff and site/side marked as required. Patient was prepped and draped in the usual sterile fashion.      Patient tolerated the procedure well.  Aftercare was discussed.  Hazel Sams, PA-C

## 2022-12-28 ENCOUNTER — Encounter: Payer: Self-pay | Admitting: Certified Registered Nurse Anesthetist

## 2022-12-29 ENCOUNTER — Telehealth: Payer: Self-pay

## 2022-12-29 ENCOUNTER — Ambulatory Visit: Payer: Managed Care, Other (non HMO) | Admitting: Gastroenterology

## 2022-12-29 ENCOUNTER — Encounter: Payer: Self-pay | Admitting: Gastroenterology

## 2022-12-29 VITALS — BP 136/77 | HR 75 | Temp 97.7°F | Ht 64.0 in | Wt 238.0 lb

## 2022-12-29 MED ORDER — SODIUM CHLORIDE 0.9 % IV SOLN: 500.0000 mL | INTRAVENOUS | Status: DC

## 2022-12-29 NOTE — Progress Notes (Unsigned)
Referring Provider: Richardean Chimera, MD Primary Care Physician:  Madelin Headings, MD  Indication for Colonoscopy:  Colon cancer screening   IMPRESSION:  Need for colon cancer screening Appropriate candidate for monitored anesthesia care  PLAN: Colonoscopy in the [facility] today   HPI: Jennifer [name] is a 47 y.o. female presents for screening colonoscopy.  No prior colonoscopy or colon cancer screening.  No known family history of colon cancer or polyps. No family history of uterine/endometrial cancer, pancreatic cancer or gastric/stomach cancer.   Past Medical History:  Diagnosis Date   Acute otitis media 08/28/2012   improved  change to liquid medication    Anxiety disorder 03/29/2016   Breast abscess of female    Recurrent   Depressive disorder 03/29/2016   Family history of adverse reaction to anesthesia    father hard time waking once (12/20/2016)   Fibroids    w/bleeding   GERD (gastroesophageal reflux disease)    History of blood transfusion    after surgery   Hypertension    Migraines    Dr. Santiago Glad; "I have a few/month" (12/20/2016)   Osteoarthritis    Pill esophagitis 08/28/2012   amoxicillin  by hx  disc plan nl voice except hoarse not drooling  close fu  if not getting better with plan stop aleve  liquid ibu onlyf necessary    Recurrent periodic urticaria    Rheumatoid arthritis    "55% of my body" (12/20/2016)   Rheumatoid arthritis    Sleep apnea    wears cpap    Past Surgical History:  Procedure Laterality Date   BREAST SURGERY     abcess under left breast    CYST EXCISION Left 2014   "elbow"   HERNIA REPAIR     INCISION AND DRAINAGE BREAST ABSCESS Left 2003   INCISIONAL HERNIA REPAIR N/A 12/18/2016   Procedure: REPAIR INCISIONAL HERNIA WITH MESH;  Surgeon: Violeta Gelinas, MD;  [facility];  Service: General;  Laterality: N/A;   INSERTION OF MESH N/A 12/18/2016   Procedure: INSERTION OF MESH;  Surgeon: Violeta Gelinas, MD;  Location: [facility] OR;   Service: General;  Laterality: N/A;   KNEE ARTHROSCOPY WITH MENISCAL REPAIR Right 06/17/2018   Procedure: RIGHT KNEE ARTHROSCOPY WITH MENISCAL REPAIR;  Surgeon: Dannielle Huh, MD;  Location: [facility] ORS;  Service: Orthopedics;  Laterality: Right;   MYOMECTOMY N/A 09/06/2016   Procedure: Conan Bowens;  Surgeon: Fermin Schwab, MD;  Location: [facility] ORS;  Service: Gynecology;  Laterality: N/A;   ROOT CANAL  05/2022   TOTAL KNEE ARTHROPLASTY Right 03/28/2021   Procedure: TOTAL KNEE ARTHROPLASTY;  Surgeon: Ollen Gross, MD;  Location: [facility] ORS;  Service: Orthopedics;  Laterality: Right;    UTERINE FIBROID SURGERY     35 fibroids    Current Outpatient Medications  Medication Sig Dispense Refill   amLODipine (NORVASC) 2.5 MG tablet Take 1 tablet (2.5 mg total) by mouth daily. In addition to 5 mg amlodipine ( total 7.5 mg per day) 90 tablet 1   ipratropium (ATROVENT) 0.03 % nasal spray Place 2 sprays into both nostrils 2 (two) times daily. 30 mL 0   levocetirizine (XYZAL) 5 MG tablet Take 1 tablet (5 mg total) by mouth every evening. 30 tablet 0   Levonorgestrel (KYLEENA) 19.5 MG IUD 19.5 mg by Intrauterine route once.     NON FORMULARY Pt uses cpap nightly     certolizumab pegol (CIMZIA) 2 X 200 MG KIT Inject 400 mg into the  skin every 28 (twenty-eight) days.     cetirizine (ZYRTEC) 10 MG tablet Take 10 mg by mouth daily as needed for allergies.     ibuprofen (ADVIL) 800 MG tablet Take 1 tablet (800 mg total) by mouth every 8 (eight) hours as needed (pain). 21 tablet 0   methocarbamol (ROBAXIN) 500 MG tablet Take 1 tablet (500 mg total) by mouth every 8 (eight) hours as needed for muscle spasms. 20 tablet 0   naproxen (NAPROSYN) 500 MG tablet Take 500 mg by mouth as needed.     nystatin-triamcinolone ointment (MYCOLOG) Apply 1 Application topically.     prednisoLONE acetate (PRED FORTE) 1 % ophthalmic suspension Place 1 drop into the left eye 2 (two) times daily. As needed     ramelteon  (ROZEREM) 8 MG tablet TAKE 1 TABLET BY MOUTH AT BEDTIME. (Patient taking differently: at bedtime as needed.) 90 tablet 1   SUMAtriptan (TOSYMRA) 10 MG/ACT SOLN Place 10 mg into the nose once as needed for up to 1 dose. May repeat after 1 hour.  Maximum 2 sprays in 24 hours. 6 each 11   triamcinolone (NASACORT) 55 MCG/ACT AERO nasal inhaler      Current Facility-Administered Medications  Medication Dose Route Frequency Provider Last Rate Last Admin   0.9 %  sodium chloride infusion  500 mL Intravenous Continuous Tressia Danas, MD        Allergies as of 12/29/2022 - Review Complete 12/29/2022  Allergen Reaction Noted   Lisinopril Anaphylaxis 08/09/2020   Lisinopril-hydrochlorothiazide Swelling and Other (See Comments) 05/22/2018    Family History  Problem Relation Age of Onset   Hypertension Mother    Rheum arthritis Mother    Hypertension Father    Sleep apnea Father    Breast cancer Neg Hx    Colon cancer Neg Hx    Colon polyps Neg Hx    Esophageal cancer Neg Hx    Rectal cancer Neg Hx    Stomach cancer Neg Hx      Physical Exam: General:   Alert,  well-nourished, pleasant and cooperative in NAD Head:  Normocephalic and atraumatic. Eyes:  Sclera clear, no icterus.   Conjunctiva pink. Mouth:  No deformity or lesions.   Neck:  Supple; no masses or thyromegaly. Lungs:  Clear throughout to auscultation.   No wheezes. Heart:  Regular rate and rhythm; no murmurs. Abdomen:  Soft, non-tender, nondistended, normal bowel sounds, no rebound or guarding.  Msk:  Symmetrical. No boney deformities LAD: No inguinal or umbilical LAD Extremities:  No clubbing or edema. Neurologic:  Alert and  oriented x4;  grossly nonfocal Skin:  No obvious rash or bruise. Psych:  Alert and cooperative. Normal mood and affect.     Studies/Results: No results found.    Ellsie Violette L. Orvan Falconer, MD, MPH 12/29/2022, 8:13 AM

## 2022-12-29 NOTE — Telephone Encounter (Signed)
-----   Message from Tressia Danas, MD sent at 12/29/2022 10:25 AM EDT ----- Please schedule hospital colonoscopy.  Thanks.  KLB ----- Message ----- From: Gwenith Spitz, RN Sent: 12/29/2022   8:42 AM EDT To: Tressia Danas, MD  Patient came to the [facility] for a colonoscopy and we were unable to get IV access. She wishes to be done at the hospital where anesthesia can get access with ultrasound. Nurses attempted x 3 and then the CRNA attempted twice. At this point the patient said she did not want to be stuck again and would like to reschedule at the hospital.

## 2022-12-29 NOTE — Progress Notes (Unsigned)
Pt's states no medical or surgical changes since previsit or office visit. 

## 2023-01-04 ENCOUNTER — Ambulatory Visit: Payer: Managed Care, Other (non HMO) | Attending: Rheumatology | Admitting: *Deleted

## 2023-01-04 VITALS — BP 146/86 | HR 73

## 2023-01-04 DIAGNOSIS — M0579 Rheumatoid arthritis with rheumatoid factor of multiple sites without organ or systems involvement: Secondary | ICD-10-CM

## 2023-01-04 MED ORDER — CERTOLIZUMAB PEGOL 2 X 200 MG ~~LOC~~ KIT
400.0000 mg | PACK | Freq: Once | SUBCUTANEOUS | Status: AC
Start: 2023-01-04 — End: 2023-01-04
  Administered 2023-01-04: 400 mg via SUBCUTANEOUS

## 2023-01-04 NOTE — Progress Notes (Signed)
Subjective:   Patient presents to clinic today to receive monthly dose of Cimzia.  Patient running a fever or have signs/symptoms of infection? No  Patient currently on antibiotics for the treatment of infection? No  Patient have any upcoming invasive procedures/surgeries? No  Objective: CMP     Component Value Date/Time   NA 140 11/02/2022 0819   NA 138 06/11/2018 1212   K 4.6 11/02/2022 0819   CL 105 11/02/2022 0819   CO2 29 11/02/2022 0819   GLUCOSE 94 11/02/2022 0819   BUN 15 11/02/2022 0819   BUN 9 06/11/2018 1212   CREATININE 0.82 11/02/2022 0819   CALCIUM 9.3 11/02/2022 0819   PROT 8.2 (H) 11/02/2022 0819   PROT 8.0 06/11/2018 1212   ALBUMIN 3.8 03/16/2021 1417   ALBUMIN 3.8 06/11/2018 1212   AST 14 11/02/2022 0819   ALT 9 11/02/2022 0819   ALKPHOS 67 03/16/2021 1417   BILITOT 0.5 11/02/2022 0819   BILITOT 0.3 06/11/2018 1212   GFRNONAA >60 03/30/2021 0310   GFRNONAA 72 07/02/2020 1106   GFRAA 84 07/02/2020 1106    CBC    Component Value Date/Time   WBC 4.0 11/02/2022 0819   RBC 4.15 11/02/2022 0819   HGB 12.5 11/02/2022 0819   HGB 11.9 06/11/2018 1212   HCT 37.5 11/02/2022 0819   HCT 36.6 06/11/2018 1212   PLT 315 11/02/2022 0819   PLT 310 06/11/2018 1212   MCV 90.4 11/02/2022 0819   MCV 93 06/11/2018 1212   MCH 30.1 11/02/2022 0819   MCHC 33.3 11/02/2022 0819   RDW 12.3 11/02/2022 0819   RDW 14.4 06/11/2018 1212   LYMPHSABS 1,924 11/02/2022 0819   LYMPHSABS 2.3 06/11/2018 1212   MONOABS 0.5 02/28/2021 1614   EOSABS 160 11/02/2022 0819   EOSABS 0.2 06/11/2018 1212   BASOSABS 20 11/02/2022 0819   BASOSABS 0.0 06/11/2018 1212    Baseline Immunosuppressant Therapy Labs TB GOLD    Latest Ref Rng & Units 06/06/2022    9:09 AM  Quantiferon TB Gold  Quantiferon TB Gold Plus NEGATIVE NEGATIVE    Hepatitis Panel    Latest Ref Rng & Units 06/13/2019    2:46 PM  Hepatitis  Hep B Surface Ag NON-REACTI NON-REACTIVE   Hep B IgM NON-REACTI  NON-REACTIVE   Hep C Ab NON-REACTI NON-REACTIVE   Hep A IgM NON-REACTI NON-REACTIVE    HIV Lab Results  Component Value Date   HIV NON-REACTIVE 02/18/2020   Immunoglobulins    Latest Ref Rng & Units 02/18/2020    9:04 AM  Immunoglobulin Electrophoresis  IgA  47 - 310 mg/dL 161   IgG 096 - 0,454 mg/dL 0,981   IgM 50 - 191 mg/dL 78    SPEP    Latest Ref Rng & Units 11/02/2022    8:19 AM  Serum Protein Electrophoresis  Total Protein 6.1 - 8.1 g/dL 8.2    Y7WG No results found for: "G6PDH" TPMT No results found for: "TPMT"   Chest x-ray: 01/27/2022 No acute cardiopulmonary process   Assessment/Plan:   Administrations This Visit     certolizumab pegol (CIMZIA) kit 400 mg     Admin Date 01/04/2023 Action Given Dose 400 mg Route Subcutaneous Administered By Henriette Combs, LPN             Patient tolerated injection well.   Appointment for next injection scheduled for 02/01/2023.  Patient due for labs in May 2024.  Patient is to call and reschedule appointment if  running a fever with signs/symptoms of infection, on antibiotics for active infection or has an upcoming invasive procedure.  All questions encouraged and answered.  Instructed patient to call with any further questions or concerns.

## 2023-01-09 ENCOUNTER — Other Ambulatory Visit: Payer: Self-pay

## 2023-01-09 DIAGNOSIS — Z1211 Encounter for screening for malignant neoplasm of colon: Secondary | ICD-10-CM

## 2023-01-09 MED ORDER — NA SULFATE-K SULFATE-MG SULF 17.5-3.13-1.6 GM/177ML PO SOLN: 1.0000 | Freq: Once | ORAL | 0 refills | Status: AC

## 2023-01-09 NOTE — Telephone Encounter (Signed)
Colon has been scheduled for 03/01/23 at 1115 am at [facility] with GM   Left message on machine to call back

## 2023-01-10 ENCOUNTER — Telehealth: Payer: Self-pay | Admitting: Gastroenterology

## 2023-01-10 NOTE — Telephone Encounter (Signed)
Patient is returning phone call from yesterday 4/23. Please advise, thank you.

## 2023-01-10 NOTE — Telephone Encounter (Signed)
See results note 4/23 ?

## 2023-01-10 NOTE — Telephone Encounter (Signed)
Left message on machine to call back  

## 2023-01-11 NOTE — Telephone Encounter (Signed)
Colon scheduled, pt instructed and medications reviewed.  Patient instructions mailed to home.  Patient to call with any questions or concerns.  

## 2023-01-18 NOTE — Progress Notes (Deleted)
Office Visit Note  Patient: Jennifer [name]             Date of Birth: 1976/06/13           MRN: [id number]             PCP: Madelin Headings, MD Referring: Madelin Headings, MD Visit Date: 02/01/2023 Occupation: @GUAROCC @  Subjective:  Due for cimzia injection   History of Present Illness: BELIEVE Jennifer [name] is a 47 y.o. female with history of seropositive rheumatoid arthritis, iritis, and osteoarthritis.  Patient is currently on Cimzia 400 mg sq injections in the office every 28 days.   CBC and CMP updated on 11/02/22. Orders for CBC and CMP released today.  Her next lab work will be due in August and every 3 months.  TB gold negative on 06/06/22.   Discussed the importance of postponing cimzia injections if she develops signs or symptoms of an infection and to resume once the infection has completely cleared.   Activities of Daily Living:  Patient reports morning stiffness for *** {minute/hour:19697}.   Patient {ACTIONS;DENIES/REPORTS:21021675::"Denies"} nocturnal pain.  Difficulty dressing/grooming: {ACTIONS;DENIES/REPORTS:21021675::"Denies"} Difficulty climbing stairs: {ACTIONS;DENIES/REPORTS:21021675::"Denies"} Difficulty getting out of chair: {ACTIONS;DENIES/REPORTS:21021675::"Denies"} Difficulty using hands for taps, buttons, cutlery, and/or writing: {ACTIONS;DENIES/REPORTS:21021675::"Denies"}  No Rheumatology ROS completed.   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   Primary osteoarthritis of both hands 02/04/2018   Sleep apnea, obstructive 09/28/2017    Primary osteoarthritis of both knees 05/22/2017   Chronic migraine without aura without status migrainosus, not intractable 01/18/2017   Incisional hernia 12/18/2016   Leiomyoma of uterus 09/06/2016   Anxiety disorder 03/29/2016   Depressive disorder 03/29/2016   Fibroid, uterine    Essential hypertension 05/25/2015   Recurrent headache 05/25/2015   Head lump 07/31/2014   Edema 12/29/2013   Baker's cyst, ruptured 12/25/2013   Cough, persistent 12/12/2013   Left leg swelling 12/12/2013   Cough 12/12/2013   High risk medication use 12/12/2013   Recurrent periodic urticaria    Acne vulgaris 11/21/2012   Postinflammatory hyperpigmentation 11/21/2012   Rheumatoid arthritis (HCC) 08/28/2012   CHRONIC LARYNGITIS 11/03/2010   ALLERGIC RHINITIS 11/03/2010   PARESTHESIA 07/28/2010   DYSMENORRHEA 10/15/2007   FIBROIDS, UTERUS 07/24/2007   OBESITY 07/24/2007   SLEEPLESSNESS 07/24/2007   HEADACHE 07/24/2007    Past Medical History:  Diagnosis Date   Acute otitis media 08/28/2012   improved  change to liquid medication    Anxiety disorder 03/29/2016   Breast abscess of female    Recurrent   Depressive disorder 03/29/2016   Family history of adverse reaction to anesthesia    father hard time waking once (12/20/2016)   Fibroids    w/bleeding   GERD (gastroesophageal reflux disease)    History of blood transfusion    after surgery   Hypertension    Migraines    Dr. Santiago Glad; "I have a few/month" (12/20/2016)   Osteoarthritis    Pill esophagitis 08/28/2012   amoxicillin  by hx  disc plan nl voice except hoarse not drooling  close  fu  if not getting better with plan stop aleve  liquid ibu onlyf necessary    Recurrent periodic urticaria    Rheumatoid arthritis (HCC)    "55% of my body" (12/20/2016)   Rheumatoid arthritis (HCC)    Sleep apnea    wears cpap    Family History  Problem Relation Age of Onset   Hypertension Mother    Rheum arthritis Mother    Hypertension Father     Sleep apnea Father    Breast cancer Neg Hx    Colon cancer Neg Hx    Colon polyps Neg Hx    Esophageal cancer Neg Hx    Rectal cancer Neg Hx    Stomach cancer Neg Hx    Past Surgical History:  Procedure Laterality Date   BREAST SURGERY     abcess under left breast    CYST EXCISION Left 2014   "elbow"   HERNIA REPAIR     INCISION AND DRAINAGE BREAST ABSCESS Left 2003   INCISIONAL HERNIA REPAIR N/A 12/18/2016   Procedure: REPAIR INCISIONAL HERNIA WITH MESH;  Surgeon: Violeta Gelinas, MD;  Location: [facility] OR;  Service: General;  Laterality: N/A;   INSERTION OF MESH N/A 12/18/2016   Procedure: INSERTION OF MESH;  Surgeon: Violeta Gelinas, MD;  Location: [facility] OR;  Service: General;  Laterality: N/A;   KNEE ARTHROSCOPY WITH MENISCAL REPAIR Right 06/17/2018   Procedure: RIGHT KNEE ARTHROSCOPY WITH MENISCAL REPAIR;  Surgeon: Dannielle Huh, MD;  Location: [facility];  Service: Orthopedics;  Laterality: Right;   MYOMECTOMY N/A 09/06/2016   Procedure: Conan Bowens;  Surgeon: Fermin Schwab, MD;  Location: [facility];  Service: Gynecology;  Laterality: N/A;   ROOT CANAL  05/2022   TOTAL KNEE ARTHROPLASTY Right 03/28/2021   Procedure: TOTAL KNEE ARTHROPLASTY;  Surgeon: Ollen Gross, MD;  Location: [facility];  Service: Orthopedics;  Laterality: Right;    UTERINE FIBROID SURGERY     35 fibroids   Social History   Social History Narrative   Not on file   Immunization History  Administered Date(s) Administered   Influenza Split 08/21/2012   Influenza,inj,Quad PF,6+ Mos 07/31/2014, 08/19/2019   Pneumococcal Conjugate-13 08/28/2012     Objective: Vital Signs: There were no vitals taken for this visit.   Physical Exam Vitals and nursing note reviewed.  Constitutional:      Appearance: She is well-developed.  HENT:     Head: Normocephalic and atraumatic.  Eyes:     Conjunctiva/sclera: Conjunctivae normal.  Cardiovascular:     Rate and Rhythm: Normal rate and regular rhythm.      Heart sounds: Normal heart sounds.  Pulmonary:     Effort: Pulmonary effort is normal.     Breath sounds: Normal breath sounds.  Abdominal:     General: Bowel sounds are normal.     Palpations: Abdomen is soft.  Musculoskeletal:     Cervical back: Normal range of motion.  Lymphadenopathy:     Cervical: No cervical adenopathy.  Skin:    General: Skin is warm and dry.     Capillary Refill: Capillary refill takes less than 2 seconds.  Neurological:     Mental Status: She is alert and oriented to person, place, and time.  Psychiatric:        Behavior: Behavior normal.      Musculoskeletal Exam: ***  CDAI Exam: CDAI Score: -- Patient Global: --; Provider Global: -- Swollen: --; Tender: -- Joint Exam 02/01/2023   No joint exam has  been documented for this visit   There is currently no information documented on the homunculus. Go to the Rheumatology activity and complete the homunculus joint exam.  Investigation: No additional findings.  Imaging: No results found.  Recent Labs: Lab Results  Component Value Date   WBC 4.0 11/02/2022   HGB 12.5 11/02/2022   PLT 315 11/02/2022   NA 140 11/02/2022   K 4.6 11/02/2022   CL 105 11/02/2022   CO2 29 11/02/2022   GLUCOSE 94 11/02/2022   BUN 15 11/02/2022   CREATININE 0.82 11/02/2022   BILITOT 0.5 11/02/2022   ALKPHOS 67 03/16/2021   AST 14 11/02/2022   ALT 9 11/02/2022   PROT 8.2 (H) 11/02/2022   ALBUMIN 3.8 03/16/2021   CALCIUM 9.3 11/02/2022   GFRAA 84 07/02/2020   QFTBGOLDPLUS NEGATIVE 06/06/2022    Speciality Comments: Prior therapy: Plaquenil (inadequate response)    Patient is needs to be seen in the office every 3 months to get Cimzia injections.  Procedures:  No procedures performed Allergies: Lisinopril and Lisinopril-hydrochlorothiazide   Assessment / Plan:     Visit Diagnoses: Rheumatoid arthritis involving multiple sites with positive rheumatoid factor (HCC)  Primary osteoarthritis of left  knee  High risk medication use  Iritis  Pain in right wrist  Primary osteoarthritis of both hands  Diabetes mellitus screening  S/P total knee arthroplasty, right  Essential hypertension  Orders: No orders of the defined types were placed in this encounter.  No orders of the defined types were placed in this encounter.   Face-to-face time spent with patient was *** minutes. Greater than 50% of time was spent in counseling and coordination of care.  Follow-Up Instructions: No follow-ups on file.   Gearldine Bienenstock, PA-C  Note - This record has been created using Dragon software.  Chart creation errors have been sought, but may not always  have been located. Such creation errors do not reflect on  the standard of medical care.

## 2023-02-01 ENCOUNTER — Ambulatory Visit: Payer: Managed Care, Other (non HMO) | Admitting: Physician Assistant

## 2023-02-01 DIAGNOSIS — H209 Unspecified iridocyclitis: Secondary | ICD-10-CM

## 2023-02-01 DIAGNOSIS — M19041 Primary osteoarthritis, right hand: Secondary | ICD-10-CM

## 2023-02-01 DIAGNOSIS — Z96651 Presence of right artificial knee joint: Secondary | ICD-10-CM

## 2023-02-01 DIAGNOSIS — Z131 Encounter for screening for diabetes mellitus: Secondary | ICD-10-CM

## 2023-02-01 DIAGNOSIS — Z79899 Other long term (current) drug therapy: Secondary | ICD-10-CM

## 2023-02-01 DIAGNOSIS — M25531 Pain in right wrist: Secondary | ICD-10-CM

## 2023-02-01 DIAGNOSIS — I10 Essential (primary) hypertension: Secondary | ICD-10-CM

## 2023-02-01 DIAGNOSIS — M0579 Rheumatoid arthritis with rheumatoid factor of multiple sites without organ or systems involvement: Secondary | ICD-10-CM

## 2023-02-01 DIAGNOSIS — M1712 Unilateral primary osteoarthritis, left knee: Secondary | ICD-10-CM

## 2023-02-05 ENCOUNTER — Telehealth: Payer: Self-pay | Admitting: Adult Health

## 2023-02-05 DIAGNOSIS — G4733 Obstructive sleep apnea (adult) (pediatric): Secondary | ICD-10-CM

## 2023-02-05 NOTE — Telephone Encounter (Signed)
Patient states her CPAP machine stopped working this morning- [facility] states that it is time for a new machine and they will need a new order. Patient is willing to make an appt IF needed. Please advise on next steps

## 2023-02-08 ENCOUNTER — Ambulatory Visit: Payer: Managed Care, Other (non HMO)

## 2023-02-08 NOTE — Telephone Encounter (Signed)
Pt has an upcoming appt with Tammy in October which is for her 1 year follow up.  Tammy, please advise on this if you would be okay with [place] going ahead and send Rx for pt to receive replacement cpap machine.

## 2023-02-08 NOTE — Telephone Encounter (Signed)
It is fine to order new CPAP . Need CPAP download. Never got from last ov

## 2023-02-09 NOTE — Telephone Encounter (Signed)
What settings range should we use for pt's machine?

## 2023-02-13 NOTE — Telephone Encounter (Signed)
Can set up for Auto CPAP 5 to 15cm.  We need CPAP download if possible .

## 2023-02-13 NOTE — Telephone Encounter (Signed)
Called and spoke with pt letting her know that we were placing an order for her to receive a new cpap machine and she verbalized understanding. Asked pt if she had been wearing her prior machine and she said she had been wearing it every night. Pt said that DME never gave her an SD card for the machine and it has now stopped working. We have no way of being able to obtain data from prior machine. Pt has been given a loaner machine by DME to use until they receive the order for the new machine.  Order has been placed. Nothing further needed.

## 2023-02-21 ENCOUNTER — Ambulatory Visit: Payer: Managed Care, Other (non HMO) | Attending: Rheumatology | Admitting: *Deleted

## 2023-02-21 VITALS — BP 152/93 | HR 65

## 2023-02-21 DIAGNOSIS — M0579 Rheumatoid arthritis with rheumatoid factor of multiple sites without organ or systems involvement: Secondary | ICD-10-CM

## 2023-02-21 DIAGNOSIS — Z79899 Other long term (current) drug therapy: Secondary | ICD-10-CM

## 2023-02-21 MED ORDER — CERTOLIZUMAB PEGOL 2 X 200 MG ~~LOC~~ KIT
400.0000 mg | PACK | Freq: Once | SUBCUTANEOUS | Status: AC
Start: 2023-02-21 — End: 2023-02-21
  Administered 2023-02-21: 400 mg via SUBCUTANEOUS

## 2023-02-21 NOTE — Progress Notes (Signed)
Subjective:   Patient presents to clinic today to receive monthly dose of Cimzia.  Patient running a fever or have signs/symptoms of infection? No  Patient currently on antibiotics for the treatment of infection? No  Patient have any upcoming invasive procedures/surgeries? No  Objective: CMP     Component Value Date/Time   NA 140 11/02/2022 0819   NA 138 06/11/2018 1212   K 4.6 11/02/2022 0819   CL 105 11/02/2022 0819   CO2 29 11/02/2022 0819   GLUCOSE 94 11/02/2022 0819   BUN 15 11/02/2022 0819   BUN 9 06/11/2018 1212   CREATININE 0.82 11/02/2022 0819   CALCIUM 9.3 11/02/2022 0819   PROT 8.2 (H) 11/02/2022 0819   PROT 8.0 06/11/2018 1212   ALBUMIN 3.8 03/16/2021 1417   ALBUMIN 3.8 06/11/2018 1212   AST 14 11/02/2022 0819   ALT 9 11/02/2022 0819   ALKPHOS 67 03/16/2021 1417   BILITOT 0.5 11/02/2022 0819   BILITOT 0.3 06/11/2018 1212   GFRNONAA >60 03/30/2021 0310   GFRNONAA 72 07/02/2020 1106   GFRAA 84 07/02/2020 1106    CBC    Component Value Date/Time   WBC 4.0 11/02/2022 0819   RBC 4.15 11/02/2022 0819   HGB 12.5 11/02/2022 0819   HGB 11.9 06/11/2018 1212   HCT 37.5 11/02/2022 0819   HCT 36.6 06/11/2018 1212   PLT 315 11/02/2022 0819   PLT 310 06/11/2018 1212   MCV 90.4 11/02/2022 0819   MCV 93 06/11/2018 1212   MCH 30.1 11/02/2022 0819   MCHC 33.3 11/02/2022 0819   RDW 12.3 11/02/2022 0819   RDW 14.4 06/11/2018 1212   LYMPHSABS 1,924 11/02/2022 0819   LYMPHSABS 2.3 06/11/2018 1212   MONOABS 0.5 02/28/2021 1614   EOSABS 160 11/02/2022 0819   EOSABS 0.2 06/11/2018 1212   BASOSABS 20 11/02/2022 0819   BASOSABS 0.0 06/11/2018 1212    Baseline Immunosuppressant Therapy Labs TB GOLD    Latest Ref Rng & Units 06/06/2022    9:09 AM  Quantiferon TB Gold  Quantiferon TB Gold Plus NEGATIVE NEGATIVE    Hepatitis Panel    Latest Ref Rng & Units 06/13/2019    2:46 PM  Hepatitis  Hep B Surface Ag NON-REACTI NON-REACTIVE   Hep B IgM NON-REACTI  NON-REACTIVE   Hep C Ab NON-REACTI NON-REACTIVE   Hep A IgM NON-REACTI NON-REACTIVE    HIV Lab Results  Component Value Date   HIV NON-REACTIVE 02/18/2020   Immunoglobulins    Latest Ref Rng & Units 02/18/2020    9:04 AM  Immunoglobulin Electrophoresis  IgA  47 - 310 mg/dL 657   IgG 846 - 9,629 mg/dL 5,284   IgM 50 - 132 mg/dL 78    SPEP    Latest Ref Rng & Units 11/02/2022    8:19 AM  Serum Protein Electrophoresis  Total Protein 6.1 - 8.1 g/dL 8.2    G4WN No results found for: "G6PDH" TPMT No results found for: "TPMT"   Chest x-ray: 01/27/2022 No acute cardiopulmonary process   Assessment/Plan:   Administrations This Visit     certolizumab pegol (CIMZIA) kit 400 mg     Admin Date 02/21/2023 Action Given Dose 400 mg Route Subcutaneous Administered By Henriette Combs, LPN             Patient tolerated injection well.   Appointment for next injection scheduled for 03/28/2023.  Patient due for labs today and she will return to the office on Friday to have  her labs drawn.  Patient is to call and reschedule appointment if running a fever with signs/symptoms of infection, on antibiotics for active infection or has an upcoming invasive procedure.  All questions encouraged and answered.  Instructed patient to call with any further questions or concerns.

## 2023-02-22 ENCOUNTER — Encounter: Payer: Self-pay | Admitting: Gastroenterology

## 2023-02-22 ENCOUNTER — Encounter (HOSPITAL_COMMUNITY): Payer: Self-pay | Admitting: Gastroenterology

## 2023-02-22 NOTE — Progress Notes (Signed)
Attempted to obtain medical history via telephone, unable to reach at this time. HIPAA compliant voicemail message left requesting return call to pre surgical testing department. 

## 2023-02-26 IMAGING — DX DG CHEST 2V
2 series · 2 of 2 positions shown · non-contrast
Comparison: Radiograph December 12, 2013

CLINICAL DATA: right thoracic pleuritic chest pain.

EXAM:
CHEST - 2 VIEW

[chest pa]
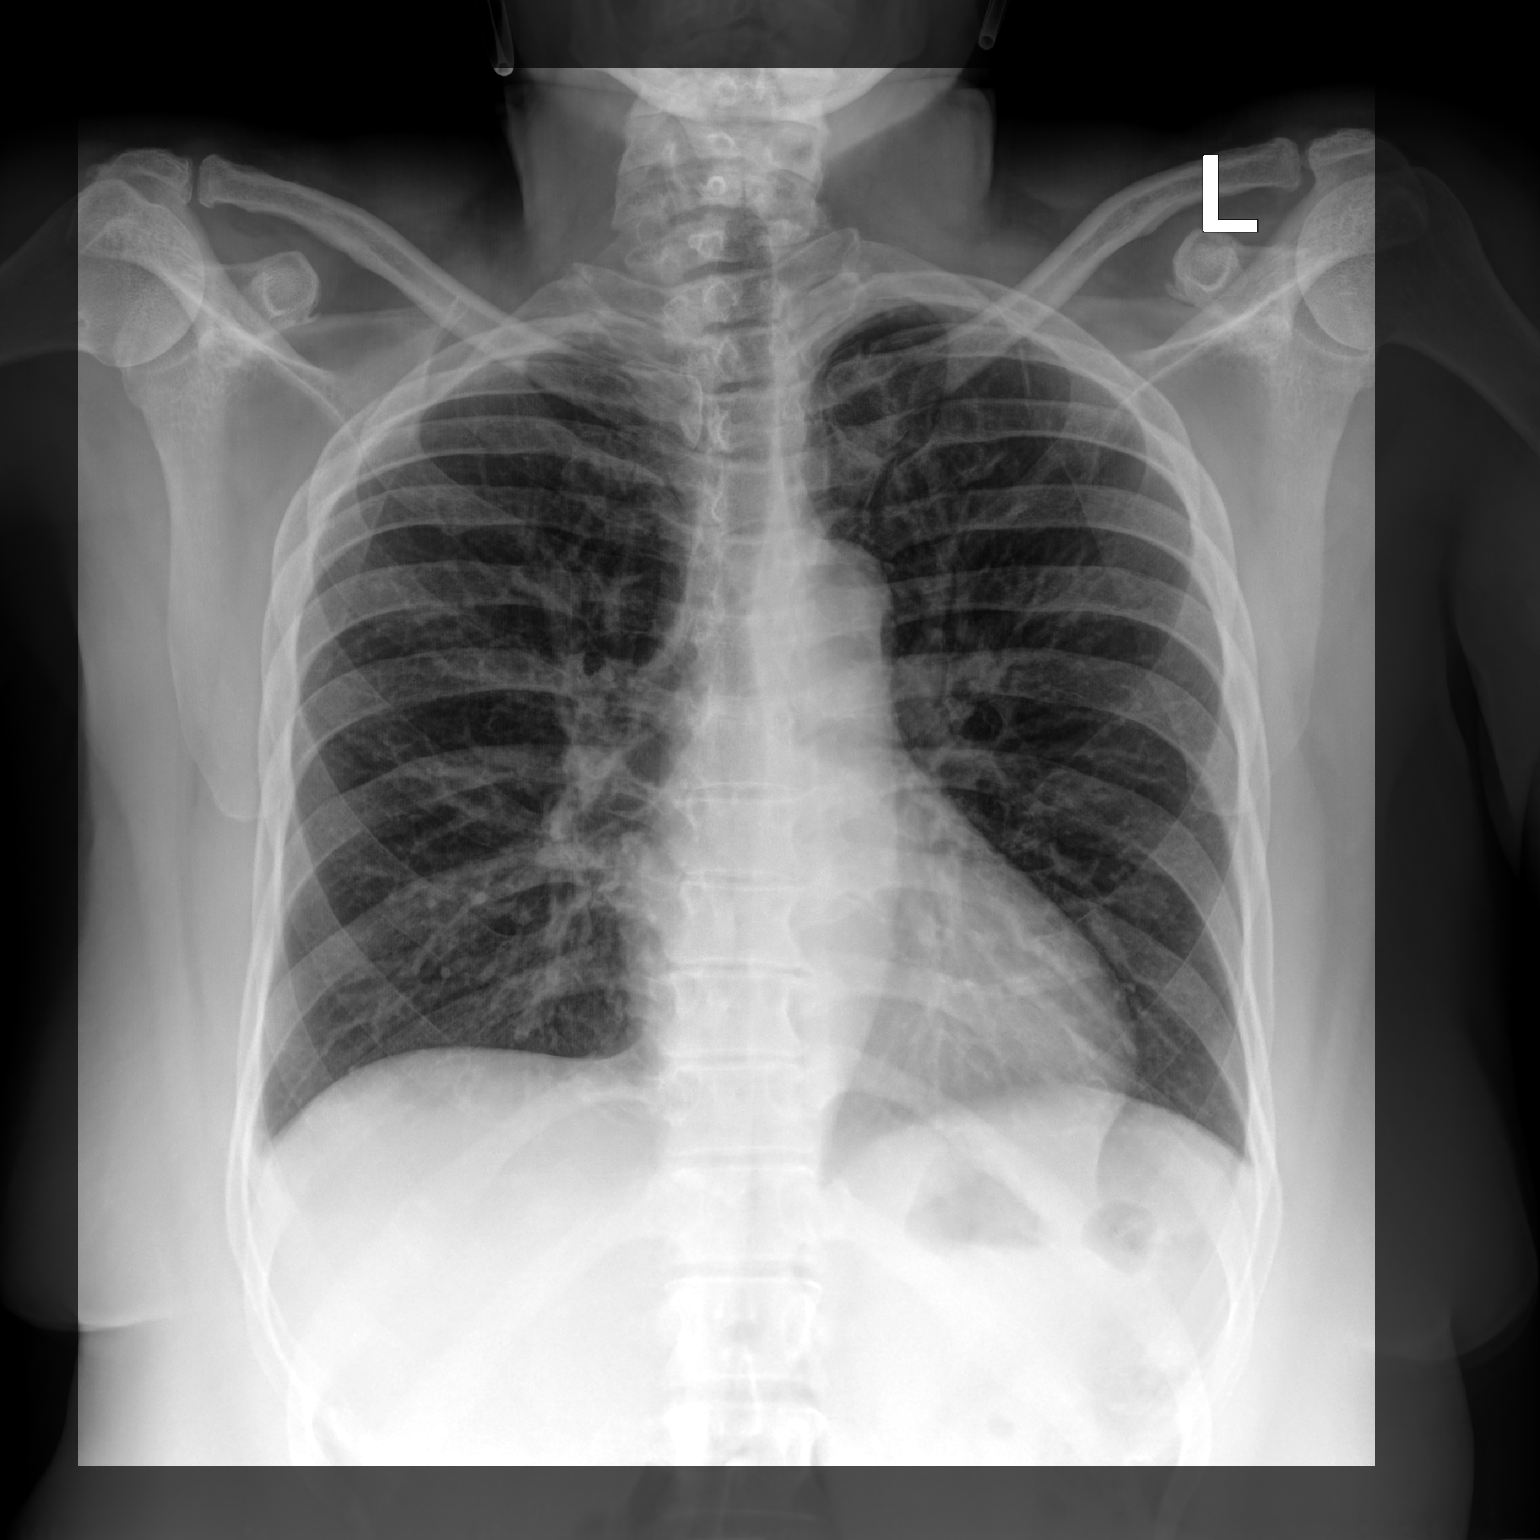

[chest lat]
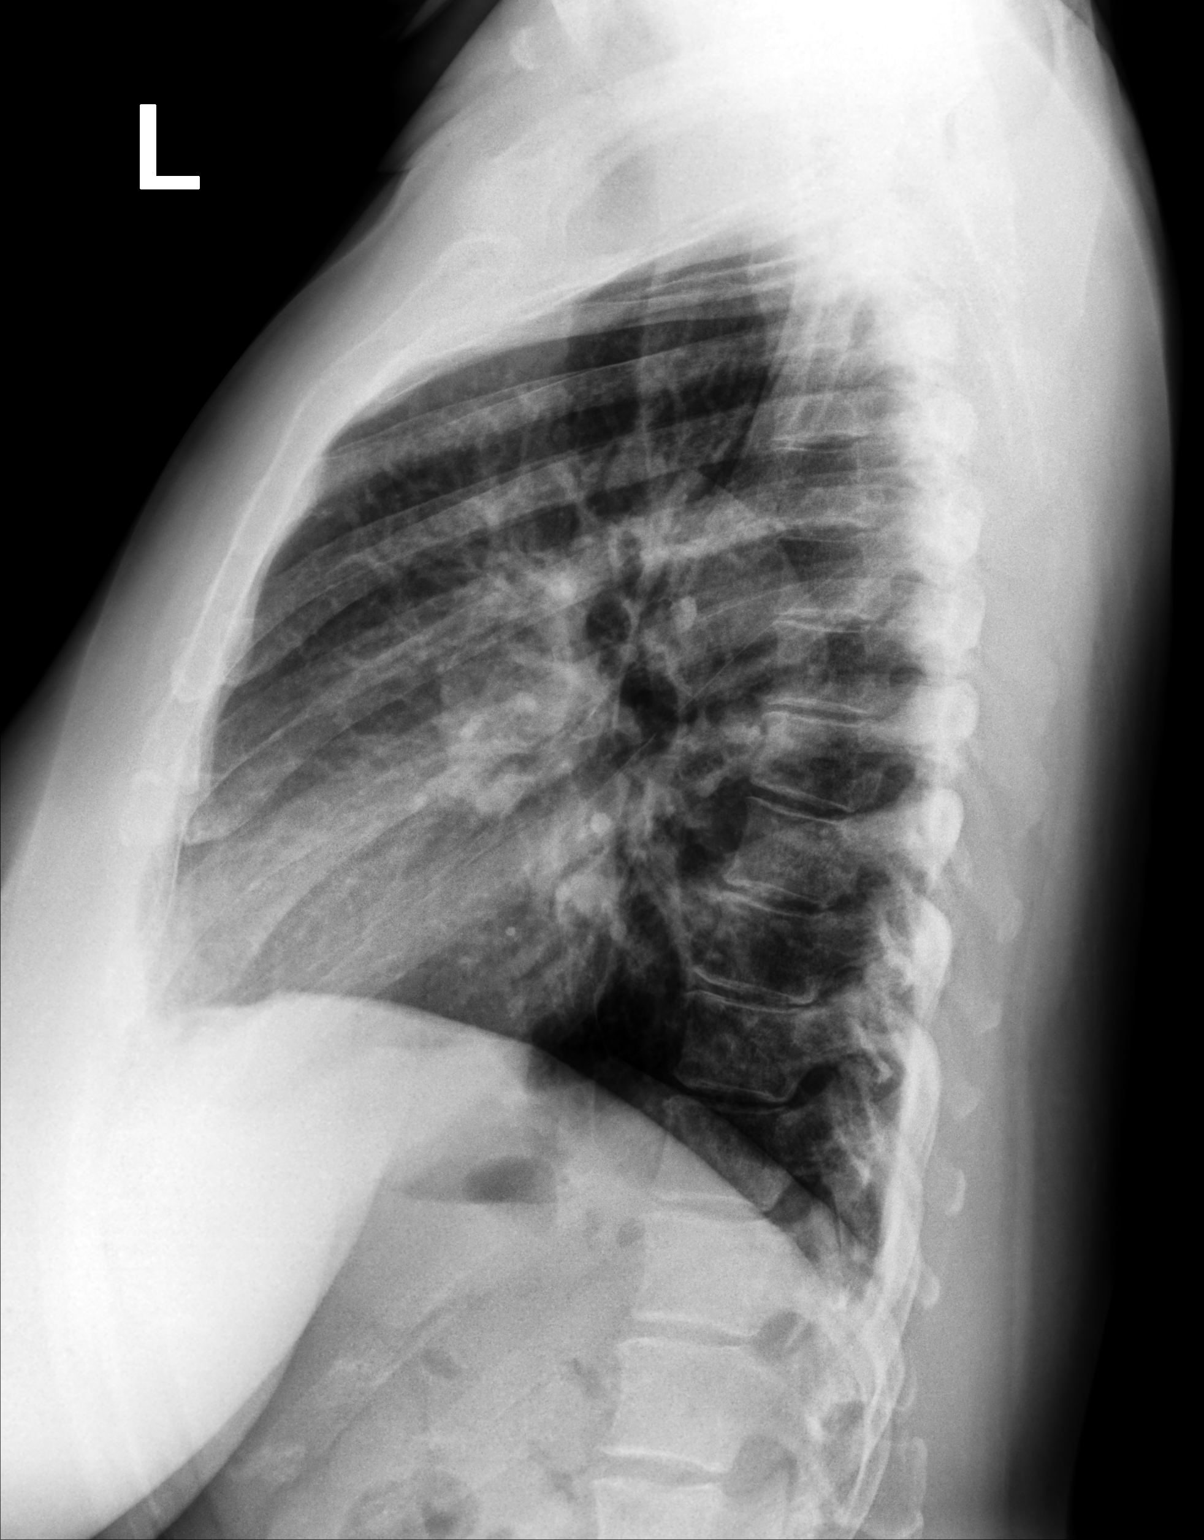

[2 of 2 positions shown; findings below may reference images not displayed]

FINDINGS: The heart size and mediastinal contours are within normal limits. No
focal airspace consolidation. No pleural effusion. No pneumothorax.
The visualized skeletal structures are unremarkable.
IMPRESSION: No acute cardiopulmonary process.

## 2023-02-27 ENCOUNTER — Telehealth: Payer: Self-pay | Admitting: Gastroenterology

## 2023-02-27 NOTE — Telephone Encounter (Signed)
Inbound call from patient stating she would like to reschedule colonoscopy on 6/13 at [facility]. Requesting a call back to have this rescheduled. Please advise, thank you.

## 2023-02-28 ENCOUNTER — Ambulatory Visit
Admission: EM | Admit: 2023-02-28 | Discharge: 2023-02-28 | Disposition: A | Discharge status: Home / Self Care | Payer: Managed Care, Other (non HMO) | Attending: Emergency Medicine | Admitting: Emergency Medicine

## 2023-02-28 ENCOUNTER — Encounter (HOSPITAL_COMMUNITY): Payer: Self-pay | Admitting: Anesthesiology

## 2023-02-28 DIAGNOSIS — U071 COVID-19: Secondary | ICD-10-CM | POA: Diagnosis not present

## 2023-02-28 DIAGNOSIS — J069 Acute upper respiratory infection, unspecified: Secondary | ICD-10-CM

## 2023-02-28 LAB — POCT RAPID STREP A (OFFICE): Rapid Strep A Screen: NEGATIVE

## 2023-02-28 MED ORDER — KETOROLAC TROMETHAMINE 30 MG/ML IJ SOLN
30.0000 mg | Freq: Once | INTRAMUSCULAR | Status: AC
  Administered 2023-02-28: 30 mg via INTRAMUSCULAR

## 2023-02-28 MED ORDER — METHOCARBAMOL 500 MG PO TABS: 500.0000 mg | ORAL_TABLET | Freq: Three times a day (TID) | ORAL | 0 refills | Status: DC | PRN

## 2023-02-28 MED ORDER — ACETAMINOPHEN 325 MG PO TABS
650.0000 mg | ORAL_TABLET | Freq: Once | ORAL | Status: AC
  Administered 2023-02-28: 650 mg via ORAL

## 2023-02-28 NOTE — ED Triage Notes (Signed)
Patient here today with c/o fever, chills, body aches, fatigue, headache, and nausea that started today. No sick contacts. This weekend she went to [place] and has been around a lot of people at a graduation party. She has not taken any medication for the symptoms yet.

## 2023-02-28 NOTE — ED Provider Notes (Signed)
[name]    CSN: [id number] Arrival date & time: 02/28/23  1836      History   Chief Complaint Chief Complaint  Patient presents with   Fever    HPI Jennifer [name] is a 47 y.o. female.   Patient presents for evaluation of fever, chills, body aches, throat clearing onset generalized headache throat clearing starting today, progressively worsening with time.  Associated nausea without vomiting and decreased appetite but tolerating fluids.  No known sick contacts but went to a large gathering over the weekend.  Has not attempted treatment of symptoms.  Denies nasal congestion, ear pain, sore throat, shortness of breath or wheezing.  Denies respiratory history, non-smoker.  History of hypertension, rheumatoid arthritis and migraines.      Past Medical History:  Diagnosis Date   Acute otitis media 08/28/2012   improved  change to liquid medication    Anxiety disorder 03/29/2016   Breast abscess of female    Recurrent   Depressive disorder 03/29/2016   Family history of adverse reaction to anesthesia    father hard time waking once (12/20/2016)   Fibroids    w/bleeding   GERD (gastroesophageal reflux disease)    History of blood transfusion    after surgery   Hypertension    Migraines    Dr. Santiago Glad; "I have a few/month" (12/20/2016)   Osteoarthritis    Pill esophagitis 08/28/2012   amoxicillin  by hx  disc plan nl voice except hoarse not drooling  close fu  if not getting better with plan stop aleve  liquid ibu onlyf necessary    Recurrent periodic urticaria    Rheumatoid arthritis (HCC)    "55% of my body" (12/20/2016)   Rheumatoid arthritis (HCC)    Sleep apnea    wears cpap    Patient Active Problem List   Diagnosis Date Noted   Hidradenitis suppurativa 11/23/2022   OSA (obstructive sleep apnea) 02/23/2022   Morbid obesity (HCC) 02/23/2022   History of total knee replacement, right 07/22/2021   Primary osteoarthritis of right knee 03/28/2021    OA (osteoarthritis) of knee 04/02/2020   Pain in right knee 01/13/2020   Chronic pain of right knee 01/21/2019   S/P right knee arthroscopy 07/23/2018   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   Primary osteoarthritis of both hands 02/04/2018   Sleep apnea, obstructive 09/28/2017   Primary osteoarthritis of both knees 05/22/2017   Chronic migraine without aura without status migrainosus, not intractable 01/18/2017   Incisional hernia 12/18/2016   Leiomyoma of uterus 09/06/2016   Anxiety disorder 03/29/2016   Depressive disorder 03/29/2016   Fibroid, uterine    Essential hypertension 05/25/2015   Recurrent headache 05/25/2015   Head lump 07/31/2014   Edema 12/29/2013   Baker's cyst, ruptured 12/25/2013   Cough, persistent 12/12/2013   Left leg swelling 12/12/2013   Cough 12/12/2013   High risk medication use 12/12/2013   Recurrent periodic urticaria    Acne vulgaris 11/21/2012   Postinflammatory hyperpigmentation 11/21/2012   Rheumatoid arthritis (HCC) 08/28/2012   CHRONIC LARYNGITIS 11/03/2010   ALLERGIC RHINITIS 11/03/2010   PARESTHESIA 07/28/2010   DYSMENORRHEA 10/15/2007   FIBROIDS, UTERUS 07/24/2007   OBESITY 07/24/2007   SLEEPLESSNESS 07/24/2007   HEADACHE 07/24/2007    Past Surgical History:  Procedure Laterality Date   BREAST SURGERY     abcess under left breast    CYST EXCISION Left 2014   "elbow"   HERNIA REPAIR     INCISION AND DRAINAGE  BREAST ABSCESS Left 2003   INCISIONAL HERNIA REPAIR N/A 12/18/2016   Procedure: REPAIR INCISIONAL HERNIA WITH MESH;  Surgeon: Violeta Gelinas, MD;  Location: [facility];  Service: General;  Laterality: N/A;   INSERTION OF MESH N/A 12/18/2016   Procedure: INSERTION OF MESH;  Surgeon: Violeta Gelinas, MD;  Location: [facility] OR;  Service: General;  Laterality: N/A;   KNEE ARTHROSCOPY WITH MENISCAL REPAIR Right 06/17/2018   Procedure: RIGHT KNEE ARTHROSCOPY WITH MENISCAL REPAIR;  Surgeon: Dannielle Huh, MD;  Location: [facility] ORS;  Service: Orthopedics;   Laterality: Right;   MYOMECTOMY N/A 09/06/2016   Procedure: Conan Bowens;  Surgeon: Fermin Schwab, MD;  Location: [facility] ORS;  Service: Gynecology;  Laterality: N/A;   ROOT CANAL  05/2022   TOTAL KNEE ARTHROPLASTY Right 03/28/2021   Procedure: TOTAL KNEE ARTHROPLASTY;  Surgeon: Ollen Gross, MD;  Location: [facility] ORS;  Service: Orthopedics;  Laterality: Right;    UTERINE FIBROID SURGERY     34 fibroids    OB History     Gravida  1   Para  1   Term      Preterm      AB      Living         SAB      IAB      Ectopic      Multiple      Live Births               Home Medications    Prior to Admission medications   Medication Sig Start Date End Date Taking? Authorizing Provider  amLODipine (NORVASC) 2.5 MG tablet Take 1 tablet (2.5 mg total) by mouth daily. In addition to 5 mg amlodipine ( total 7.5 mg per day) 11/07/21  Yes Panosh, Neta Mends, MD  certolizumab pegol (CIMZIA) 2 X 200 MG KIT Inject 400 mg into the skin every 28 (twenty-eight) days.   Yes [provider]  NON FORMULARY Pt uses cpap nightly   Yes [provider]  SUMAtriptan (TOSYMRA) 10 MG/ACT SOLN Place 10 mg into the nose once as needed for up to 1 dose. May repeat after 1 hour.  Maximum 2 sprays in 24 hours. 10/17/22  Yes Jaffe, Adam R, DO  cetirizine (ZYRTEC) 10 MG tablet Take 10 mg by mouth daily as needed for allergies.    [provider]  clindamycin-benzoyl peroxide (BENZACLIN) gel Apply 1 Application topically as needed. 01/04/15   [provider]  ibuprofen (ADVIL) 800 MG tablet Take 1 tablet (800 mg total) by mouth every 8 (eight) hours as needed (pain). 01/27/22   Zenia Resides, MD  ipratropium (ATROVENT) 0.03 % nasal spray Place 2 sprays into both nostrils 2 (two) times daily. 09/23/22   Wallis Bamberg, PA-C  levocetirizine (XYZAL) 5 MG tablet Take 1 tablet (5 mg total) by mouth every evening. 09/23/22   Wallis Bamberg, PA-C  Levonorgestrel [facility]) 19.5  MG IUD 19.5 mg by Intrauterine route once.    [provider]  methocarbamol (ROBAXIN) 500 MG tablet Take 1 tablet (500 mg total) by mouth every 8 (eight) hours as needed for muscle spasms. 01/27/22   Zenia Resides, MD  naproxen (NAPROSYN) 500 MG tablet Take 500 mg by mouth as needed. 08/07/19   [provider]  nystatin-triamcinolone ointment (MYCOLOG) Apply 1 Application topically. 08/07/19   [provider]  prednisoLONE acetate (PRED FORTE) 1 % ophthalmic suspension Place 1 drop into the left eye 2 (two) times daily. As  needed    [provider]  ramelteon (ROZEREM) 8 MG tablet TAKE 1 TABLET BY MOUTH AT BEDTIME. Patient taking differently: at bedtime as needed. 12/01/21   Panosh, Neta Mends, MD  triamcinolone (NASACORT) 55 MCG/ACT AERO nasal inhaler     [provider]  eletriptan (RELPAX) 40 MG tablet Take 1 tablet (40 mg total) by mouth as needed for migraine or headache. May repeat in 2 hours if headache persists or recurs. 10/02/19 11/29/20  Panosh, Neta Mends, MD    Family History Family History  Problem Relation Age of Onset   Hypertension Mother    Rheum arthritis Mother    Hypertension Father    Sleep apnea Father    Breast cancer Neg Hx    Colon cancer Neg Hx    Colon polyps Neg Hx    Esophageal cancer Neg Hx    Rectal cancer Neg Hx    Stomach cancer Neg Hx     Social History Social History   Tobacco Use   Smoking status: Never    Passive exposure: Never   Smokeless tobacco: Never  Vaping Use   Vaping Use: Never used  Substance Use Topics   Alcohol use: Yes    Comment: social   Drug use: Never     Allergies   Lisinopril and Lisinopril-hydrochlorothiazide   Review of Systems Review of Systems  Constitutional:  Positive for chills and fever. Negative for activity change, appetite change, diaphoresis, fatigue and unexpected weight change.  HENT: Negative.    Respiratory:  Positive for cough. Negative for apnea,  choking, chest tightness, shortness of breath and stridor.   Cardiovascular: Negative.   Gastrointestinal:  Positive for nausea. Negative for abdominal distention, abdominal pain, anal bleeding, blood in stool, constipation, diarrhea, rectal pain and vomiting.  Musculoskeletal:  Positive for myalgias. Negative for arthralgias, back pain, gait problem, joint swelling, neck pain and neck stiffness.  Neurological:  Positive for headaches. Negative for dizziness, tremors, seizures, syncope, facial asymmetry, speech difficulty, weakness, light-headedness and numbness.     Physical Exam Triage Vital Signs ED Triage Vitals  Enc Vitals Group     BP 02/28/23 1919 (!) 159/90     Pulse Rate 02/28/23 1919 (!) 102     Resp 02/28/23 1919 18     Temp 02/28/23 1919 (!) 102.2 F (39 C)     Temp Source 02/28/23 1919 Oral     SpO2 02/28/23 1919 97 %     Weight 02/28/23 1919 250 lb (113.4 kg)     Height 02/28/23 1919 5\' 4"  (1.626 m)     Head Circumference --      Peak Flow --      Pain Score 02/28/23 1918 8     Pain Loc --      Pain Edu? --      Excl. in GC? --    No data found.  Updated Vital Signs BP (!) 159/90   Pulse (!) 102   Temp (!) 102.2 F (39 C) (Oral)   Resp 18   Ht 5\' 4"  (1.626 m)   Wt 250 lb (113.4 kg)   SpO2 97%   BMI 42.91 kg/m   Visual Acuity Right Eye Distance:   Left Eye Distance:   Bilateral Distance:    Right Eye Near:   Left Eye Near:    Bilateral Near:     Physical Exam Constitutional:      Appearance: Normal appearance.  HENT:     Right Ear:  Tympanic membrane, ear canal and external ear normal.     Left Ear: Tympanic membrane, ear canal and external ear normal.     Nose: No congestion or rhinorrhea.     Mouth/Throat:     Pharynx: No posterior oropharyngeal erythema.     Tonsils: No tonsillar exudate. 2+ on the right. 2+ on the left.  Cardiovascular:     Rate and Rhythm: Normal rate and regular rhythm.     Pulses: Normal pulses.     Heart sounds:  Normal heart sounds.  Pulmonary:     Effort: Pulmonary effort is normal.     Breath sounds: Normal breath sounds.  Skin:    General: Skin is warm and dry.  Neurological:     Mental Status: She is alert and oriented to person, place, and time. Mental status is at baseline.  Psychiatric:        Mood and Affect: Mood normal.        Behavior: Behavior normal.      UC Treatments / Results  Labs (all labs ordered are listed, but only abnormal results are displayed) Labs Reviewed  SARS CORONAVIRUS 2 (TAT 6-24 HRS)  POCT RAPID STREP A (OFFICE)    EKG   Radiology No results found.  Procedures Procedures (including critical care time)  Medications Ordered in UC Medications  acetaminophen (TYLENOL) tablet 650 mg (650 mg Oral Given 02/28/23 1926)    Initial Impression / Assessment and Plan / UC Course  I have reviewed the triage vital signs and the nursing notes.  Pertinent labs & imaging results that were available during my care of the patient were reviewed by me and considered in my medical decision making (see chart for details).  Viral URI with cough   Fever 102.2 with associated tachycardia, while ill-appearing patient is in no signs of distress nor toxic appearing, lungs are clear to auscultation, low suspicion for pneumonia, pneumothorax or bronchitis and therefore will defer imaging.  Rapid strep test negative.  COVID test pending, reviewed quarantine guidelines per CDC recommendations, will qualify for antivirals if positive, discussed administration.  Given in office for management of headache.  Prescribed Robaxin for management of body aches and advised consistent use of Tylenol and Motrin. May use additional over-the-counter medications as needed for supportive care.  May follow-up with urgent care as needed if symptoms persist or worsen.  Note given.   Final Clinical Impressions(s) / UC Diagnoses   Final diagnoses:  Viral URI with cough   Discharge Instructions    None    ED Prescriptions   None    PDMP not reviewed this encounter.   Valinda Hoar, NP 02/28/23 1956

## 2023-02-28 NOTE — Discharge Instructions (Signed)
Your symptoms today are most likely being caused by a virus and should steadily improve in time it can take up to 7 to 10 days before you truly start to see a turnaround however things will get better  Rapid strep test negative  COVID test is pending up to 24 hours you will be notified of positive test results only, if positive you receive the antiviral medicine, this will to minimize symptoms but would not fully resolve your illness, current quarantine guidelines are you must stay home whenever fever, return to activity when 24 hours without a fever, need to wear mask until all symptoms have resolved if positive  Been given an injection of Toradol for management of your headache  You have been sent and muscle relaxers to use for body aches, take Tylenol and Motrin consistently to help minimize fevers which will help minimize body aches    For cough: honey 1/2 to 1 teaspoon (you can dilute the honey in water or another fluid).  You can also use guaifenesin and dextromethorphan for cough. You can use a humidifier for chest congestion and cough.  If you don't have a humidifier, you can sit in the bathroom with the hot shower running.      For sore throat: try warm salt water gargles, cepacol lozenges, throat spray, warm tea or water with lemon/honey, popsicles or ice, or OTC cold relief medicine for throat discomfort.   For congestion: take a daily anti-histamine like Zyrtec, Claritin, and a oral decongestant, such as pseudoephedrine.  You can also use Flonase 1-2 sprays in each nostril daily.   It is important to stay hydrated: drink plenty of fluids (water, gatorade/powerade/pedialyte, juices, or teas) to keep your throat moisturized and help further relieve irritation/discomfort.

## 2023-02-28 NOTE — Telephone Encounter (Signed)
Left message on machine to call back   Offer 05/14/23 at 1:45 pm at [facility] with GM

## 2023-02-28 NOTE — Anesthesia Preprocedure Evaluation (Deleted)
Anesthesia Evaluation  Patient identified by MRN, date of birth, ID band Patient awake    Reviewed: Allergy & Precautions, NPO status , Patient's Chart, lab work & pertinent test results  History of Anesthesia Complications (+) DIFFICULT IV STICK / SPECIAL LINE, Family history of anesthesia reaction and history of anesthetic complications (father with prolonged emergence one time)  Airway       Comment: Previous grade II view with MAC 3, easy mask Dental   Pulmonary sleep apnea and Continuous Positive Airway Pressure Ventilation           Cardiovascular hypertension (amlodipine), Pt. on medications      Neuro/Psych  Headaches PSYCHIATRIC DISORDERS Anxiety Depression       GI/Hepatic Bowel prep,GERD  ,,  Endo/Other    Renal/GU      Musculoskeletal  (+) Arthritis , Osteoarthritis and Rheumatoid disorders,    Abdominal   Peds  Hematology   Anesthesia Other Findings   Reproductive/Obstetrics                              Anesthesia Physical Anesthesia Plan  ASA: 3  Anesthesia Plan: MAC   Post-op Pain Management: Minimal or no pain anticipated   Induction: Intravenous  PONV Risk Score and Plan: 2 and Propofol infusion and Treatment may vary due to age or medical condition  Airway Management Planned: Natural Airway and Simple Face Mask  Additional Equipment:   Intra-op Plan:   Post-operative Plan:   Informed Consent:      Dental advisory given  Plan Discussed with: CRNA and Anesthesiologist  Anesthesia Plan Comments: (Discussed with patient risks of MAC including, but not limited to, minor pain or discomfort, hearing people in the room, and possible need for backup general anesthesia. Risks for general anesthesia also discussed including, but not limited to, sore throat, hoarse voice, chipped/damaged teeth, injury to vocal cords, nausea and vomiting, allergic reactions, lung  infection, heart attack, stroke, and death. All questions answered. )         Anesthesia Quick Evaluation

## 2023-03-01 ENCOUNTER — Encounter (HOSPITAL_COMMUNITY): Admission: RE | Payer: Self-pay | Source: Home / Self Care

## 2023-03-01 ENCOUNTER — Ambulatory Visit (HOSPITAL_COMMUNITY)
Admission: RE | Admit: 2023-03-01 | Payer: Managed Care, Other (non HMO) | Source: Home / Self Care | Admitting: Gastroenterology

## 2023-03-01 LAB — SARS CORONAVIRUS 2 (TAT 6-24 HRS): SARS Coronavirus 2: POSITIVE — AB

## 2023-03-01 SURGERY — COLONOSCOPY WITH PROPOFOL
Anesthesia: Monitor Anesthesia Care

## 2023-03-01 NOTE — Telephone Encounter (Signed)
The pt has been rescheduled to 06/07/23 8 am at [facility] with GM   The pt has been advised and will be sent new instructions.

## 2023-03-02 ENCOUNTER — Telehealth (HOSPITAL_COMMUNITY): Payer: Self-pay | Admitting: Emergency Medicine

## 2023-03-02 MED ORDER — BENZONATATE 100 MG PO CAPS
100.0000 mg | ORAL_CAPSULE | Freq: Three times a day (TID) | ORAL | 0 refills | Status: AC
Start: 2023-03-02 — End: ?

## 2023-03-02 MED ORDER — MOLNUPIRAVIR EUA 200MG CAPSULE: 4.0000 | ORAL_CAPSULE | Freq: Two times a day (BID) | ORAL | 0 refills | Status: AC

## 2023-03-02 NOTE — Telephone Encounter (Signed)
Cough medicine, per patient request, Viral URI protocols

## 2023-03-12 NOTE — Progress Notes (Deleted)
NEUROLOGY FOLLOW UP OFFICE NOTE  Jennifer [name] [id number]  Assessment/Plan:   Migraine without aura, without status migrainosus, not intractable Hypertension  Migraine prevention:  ***.  Would not use Aimovig as she already has hypertension Migraine rescue:  Tosymra NS *** Limit use of pain relievers to no more than 2 days out of week to prevent risk of rebound or medication-overuse headache. Keep headache diary Follow up ***  Subjective:  Jennifer [name] is a 47 year old female with rheumatoid arthritis who follows up for migraines.  UPDATE: Advised to contact insurance to find out which CGRP inhibitors are formulary.  ***     Current NSAIDS:  Naproxen 500mg  Current analgesics:  Excedrin Migraine Current triptans:  Tosymra NS Current ergotamine:  none Current anti-emetic:  none Current muscle relaxants:  Robaxin Current anti-anxiolytic:  none Current sleep aide:  Melatonin 10mg  PRN Current Antihypertensive medications:  amlodipine Current Antidepressant medications:  none Current Anticonvulsant medications:  gabapentin 300mg  TID PRN Current anti-CGRP:  none Current Vitamins/Herbal/Supplements:  Folic acid; melatonin 10mg  PRN Current Antihistamines/Decongestants:  Zyrtec Other therapy:  none Hormone/birth control:  Kyleena Other medications:  methotrexate  Caffeine:  1 to 2 cups of coffee daily.  Sometimes at 4 PM.  No soda or tea Diet:  No soda.  Four 16 oz bottles of water daily.   Depression:  no; Anxiety:  Yes (family-related Other pain:  arthritic pain in her other knee Sleep hygiene:  Poor.  She was diagnosed with OSA and uses a CPAP.  However, she can't turn off her mind.  4 to 6 hours of sleep a night.  Takes melatonin  HISTORY:  She started having migraines in her late-20s to early-30s.  They are severe mid-frontal/bitemporal/back of neck throbbing pain.  They are associated with seeing white spots when severe, photophobia, phonophobia, rarely nausea..   Her migraines typically last 2 hours to 2 days and occur 2 to 3 times a month.  Triggers include stress and fluorescent lights.     She developed a migraine on 09/18/2019, however it was persistent.  She was subsequently diagnosed with Covid a few days later.  The migraine did not break.  She was prescribed a prednisone taper on 10/08/2019 and the migraine broke.  She currently reports increased family-related stress.        Past NSAIDS/ steroids:  Ibuprofen 800mg ; prednisone Past analgesics:  Tramadol Past abortive triptans: Maxalt MLT 10mg ; sumatriptan 20mg  NS, rizatriptan, eletriptan Past abortive ergotamine:  none Past muscle relaxants:  Flexeril Past anti-emetic:  none Past antihypertensive medications:  Labetolol; lisinopril-HCTZ Past antidepressant medications:  Doxepin 10mg  BID (hives); fluoxetine 10mg  Past anticonvulsant medications:  zonisamide 100mg  daily; topiramate (stopped due to potential memory problems), gabapentin Past anti-CGRP:  none Past vitamins/Herbal/Supplements:  none Past antihistamines/decongestants:  none Other past therapies:  none    Family history of headache:  Aunt (used to have migraines)  PAST MEDICAL HISTORY: Past Medical History:  Diagnosis Date   Acute otitis media 08/28/2012   improved  change to liquid medication    Breast abscess of female    Recurrent   Family history of adverse reaction to anesthesia    father hard time waking once (12/20/2016)   Fibroids    w/bleeding   GERD (gastroesophageal reflux disease)    History of blood transfusion    after surgery   Hypertension    Migraines    Dr. Santiago Glad; "I have a few/month" (12/20/2016)   Osteoarthritis  Pill esophagitis 08/28/2012   amoxicillin  by hx  disc plan nl voice except hoarse not drooling  close fu  if not getting better with plan stop aleve  liquid ibu onlyf necessary    Recurrent periodic urticaria    Rheumatoid arthritis (HCC)    "55% of my body" (12/20/2016)    Rheumatoid arthritis (HCC)    Sleep apnea    wears cpap    MEDICATIONS: Current Outpatient Medications on File Prior to Visit  Medication Sig Dispense Refill   amLODipine (NORVASC) 2.5 MG tablet Take 1 tablet (2.5 mg total) by mouth daily. In addition to 5 mg amlodipine ( total 7.5 mg per day) 90 tablet 1   certolizumab pegol (CIMZIA) 2 X 200 MG KIT Inject 400 mg into the skin every 28 (twenty-eight) days.     cetirizine (ZYRTEC) 10 MG tablet Take 10 mg by mouth daily as needed for allergies.     ibuprofen (ADVIL) 800 MG tablet Take 1 tablet (800 mg total) by mouth every 8 (eight) hours as needed (pain). 21 tablet 0   ipratropium (ATROVENT) 0.03 % nasal spray Place 2 sprays into both nostrils 2 (two) times daily. 30 mL 0   levocetirizine (XYZAL) 5 MG tablet Take 1 tablet (5 mg total) by mouth every evening. 30 tablet 0   Levonorgestrel (KYLEENA) 19.5 MG IUD 19.5 mg by Intrauterine route once.     methocarbamol (ROBAXIN) 500 MG tablet Take 1 tablet (500 mg total) by mouth every 8 (eight) hours as needed for muscle spasms. 20 tablet 0   NON FORMULARY Pt uses cpap nightly     prednisoLONE acetate (PRED FORTE) 1 % ophthalmic suspension Place 1 drop into the left eye 2 (two) times daily. As needed     ramelteon (ROZEREM) 8 MG tablet TAKE 1 TABLET BY MOUTH AT BEDTIME. (Patient taking differently: at bedtime as needed.) 90 tablet 1   triamcinolone (NASACORT) 55 MCG/ACT AERO nasal inhaler      [DISCONTINUED] eletriptan (RELPAX) 40 MG tablet Take 1 tablet (40 mg total) by mouth as needed for migraine or headache. May repeat in 2 hours if headache persists or recurs. 10 tablet 1   No current facility-administered medications on file prior to visit.      ALLERGIES: Allergies  Allergen Reactions   Lisinopril Anaphylaxis   Lisinopril-Hydrochlorothiazide Swelling and Other (See Comments)    Angioedema  Has tolerated maxide in past so most likely  The ACE inhibitor as the cause     FAMILY  HISTORY: Family History  Problem Relation Age of Onset   Hypertension Mother    Rheum arthritis Mother    Hypertension Father    Sleep apnea Father    Breast cancer Neg Hx       Objective:  *** General: No acute distress.  Patient appears well-groomed.   Head:  Normocephalic/atraumatic Neck:  Supple.  No paraspinal tenderness.  Full range of motion. Heart:  Regular rate and rhythm. Neuro:  Alert and oriented.  Speech fluent and not dysarthric.  Language intact.  CN II-XII intact.  Bulk and tone normal.  Muscle strength 5/5 throughout.  Deep tendon reflexes 2+ throughout.  Gait normal.  Romberg negative.    Shon Millet, DO  CC: Berniece Andreas, MD

## 2023-03-13 ENCOUNTER — Ambulatory Visit: Payer: Managed Care, Other (non HMO) | Admitting: Neurology

## 2023-03-13 ENCOUNTER — Encounter: Payer: Self-pay | Admitting: Neurology

## 2023-03-13 DIAGNOSIS — Z029 Encounter for administrative examinations, unspecified: Secondary | ICD-10-CM

## 2023-03-15 NOTE — Progress Notes (Deleted)
Office Visit Note  Patient: Jennifer [name]             Date of Birth: Mar 04, 1976           MRN: [id number]             PCP: Madelin Headings, MD Referring: Madelin Headings, MD Visit Date: 03/28/2023 Occupation: @GUAROCC @  Subjective:    History of Present Illness: Jennifer [name] is a 47 y.o. female with history of seropositive rheumatoid arthritis.  Patient remains on Cimzia 400 mg sq injections in the office every 28 days.   CBC and CMP updated on 11/02/22. Orders for CBC and CMP released today.  TB gold negative on 06/06/22.  Discussed the importance of holding cimzia if she develops signs or symptoms of an infection and to resume once the infection has completely cleared.   Activities of Daily Living:  Patient reports morning stiffness for *** {minute/hour:19697}.   Patient {ACTIONS;DENIES/REPORTS:21021675::"Denies"} nocturnal pain.  Difficulty dressing/grooming: {ACTIONS;DENIES/REPORTS:21021675::"Denies"} Difficulty climbing stairs: {ACTIONS;DENIES/REPORTS:21021675::"Denies"} Difficulty getting out of chair: {ACTIONS;DENIES/REPORTS:21021675::"Denies"} Difficulty using hands for taps, buttons, cutlery, and/or writing: {ACTIONS;DENIES/REPORTS:21021675::"Denies"}  No Rheumatology ROS completed.   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   Primary osteoarthritis of both hands 02/04/2018   Sleep apnea, obstructive 09/28/2017   Primary osteoarthritis of both knees 05/22/2017   Chronic migraine without aura without status migrainosus, not intractable  01/18/2017   Incisional hernia 12/18/2016   Leiomyoma of uterus 09/06/2016   Anxiety disorder 03/29/2016   Depressive disorder 03/29/2016   Fibroid, uterine    Essential hypertension 05/25/2015   Recurrent headache 05/25/2015   Head lump 07/31/2014   Edema 12/29/2013   Baker's cyst, ruptured 12/25/2013   Cough, persistent 12/12/2013   Left leg swelling 12/12/2013   Cough 12/12/2013   High risk medication use 12/12/2013   Recurrent periodic urticaria    Acne vulgaris 11/21/2012   Postinflammatory hyperpigmentation 11/21/2012   Rheumatoid arthritis (HCC) 08/28/2012   CHRONIC LARYNGITIS 11/03/2010   ALLERGIC RHINITIS 11/03/2010   PARESTHESIA 07/28/2010   DYSMENORRHEA 10/15/2007   FIBROIDS, UTERUS 07/24/2007   OBESITY 07/24/2007   SLEEPLESSNESS 07/24/2007   HEADACHE 07/24/2007    Past Medical History:  Diagnosis Date   Acute otitis media 08/28/2012   improved  change to liquid medication    Anxiety disorder 03/29/2016   Breast abscess of female    Recurrent   Depressive disorder 03/29/2016   Family history of adverse reaction to anesthesia    father hard time waking once (12/20/2016)   Fibroids    w/bleeding   GERD (gastroesophageal reflux disease)    History of blood transfusion    after surgery   Hypertension    Migraines    Dr. Santiago Glad; "I have a few/month" (12/20/2016)   Osteoarthritis    Pill esophagitis 08/28/2012   amoxicillin  by hx  disc plan nl voice except hoarse not drooling  close fu  if not getting better with plan stop aleve  liquid ibu onlyf necessary    Recurrent periodic urticaria  Rheumatoid arthritis (HCC)    "55% of my body" (12/20/2016)   Rheumatoid arthritis (HCC)    Sleep apnea    wears cpap    Family History  Problem Relation Age of Onset   Hypertension Mother    Rheum arthritis Mother    Hypertension Father    Sleep apnea Father    Breast cancer Neg Hx    Colon cancer Neg Hx    Colon polyps Neg Hx    Esophageal cancer Neg Hx     Rectal cancer Neg Hx    Stomach cancer Neg Hx    Past Surgical History:  Procedure Laterality Date   BREAST SURGERY     abcess under left breast    CYST EXCISION Left 2014   "elbow"   HERNIA REPAIR     INCISION AND DRAINAGE BREAST ABSCESS Left 2003   INCISIONAL HERNIA REPAIR N/A 12/18/2016   Procedure: REPAIR INCISIONAL HERNIA WITH MESH;  Surgeon: Violeta Gelinas, MD;  Location: [facility];  Service: General;  Laterality: N/A;   INSERTION OF MESH N/A 12/18/2016   Procedure: INSERTION OF MESH;  Surgeon: Violeta Gelinas, MD;  Location: [facility];  Service: General;  Laterality: N/A;   KNEE ARTHROSCOPY WITH MENISCAL REPAIR Right 06/17/2018   Procedure: RIGHT KNEE ARTHROSCOPY WITH MENISCAL REPAIR;  Surgeon: Dannielle Huh, MD;  Location: [facility];  Service: Orthopedics;  Laterality: Right;   MYOMECTOMY N/A 09/06/2016   Procedure: Conan Bowens;  Surgeon: Fermin Schwab, MD;  Location: [facility];  Service: Gynecology;  Laterality: N/A;   ROOT CANAL  05/2022   TOTAL KNEE ARTHROPLASTY Right 03/28/2021   Procedure: TOTAL KNEE ARTHROPLASTY;  Surgeon: Ollen Gross, MD;  Location: [facility];  Service: Orthopedics;  Laterality: Right;    UTERINE FIBROID SURGERY     34 fibroids   Social History   Social History Narrative   Not on file   Immunization History  Administered Date(s) Administered   Influenza Split 08/21/2012   Influenza,inj,Quad PF,6+ Mos 07/31/2014, 08/19/2019   Pneumococcal Conjugate-13 08/28/2012     Objective: Vital Signs: There were no vitals taken for this visit.   Physical Exam Vitals and nursing note reviewed.  Constitutional:      Appearance: She is well-developed.  HENT:     Head: Normocephalic and atraumatic.  Eyes:     Conjunctiva/sclera: Conjunctivae normal.  Cardiovascular:     Rate and Rhythm: Normal rate and regular rhythm.     Heart sounds: Normal heart sounds.  Pulmonary:     Effort: Pulmonary effort is normal.     Breath sounds: Normal breath  sounds.  Abdominal:     General: Bowel sounds are normal.     Palpations: Abdomen is soft.  Musculoskeletal:     Cervical back: Normal range of motion.  Lymphadenopathy:     Cervical: No cervical adenopathy.  Skin:    General: Skin is warm and dry.     Capillary Refill: Capillary refill takes less than 2 seconds.  Neurological:     Mental Status: She is alert and oriented to person, place, and time.  Psychiatric:        Behavior: Behavior normal.      Musculoskeletal Exam: ***  CDAI Exam: CDAI Score: -- Patient Global: --; Provider Global: -- Swollen: --; Tender: -- Joint Exam 03/28/2023   No joint exam has been documented for this visit   There is currently no information documented on the homunculus. Go to the Rheumatology activity and complete the  homunculus joint exam.  Investigation: No additional findings.  Imaging: No results found.  Recent Labs: Lab Results  Component Value Date   WBC 4.0 11/02/2022   HGB 12.5 11/02/2022   PLT 315 11/02/2022   NA 140 11/02/2022   K 4.6 11/02/2022   CL 105 11/02/2022   CO2 29 11/02/2022   GLUCOSE 94 11/02/2022   BUN 15 11/02/2022   CREATININE 0.82 11/02/2022   BILITOT 0.5 11/02/2022   ALKPHOS 67 03/16/2021   AST 14 11/02/2022   ALT 9 11/02/2022   PROT 8.2 (H) 11/02/2022   ALBUMIN 3.8 03/16/2021   CALCIUM 9.3 11/02/2022   GFRAA 84 07/02/2020   QFTBGOLDPLUS NEGATIVE 06/06/2022    Speciality Comments: Prior therapy: Plaquenil (inadequate response)    Patient is needs to be seen in the office every 3 months to get Cimzia injections.  Procedures:  No procedures performed Allergies: Lisinopril and Lisinopril-hydrochlorothiazide   Assessment / Plan:     Visit Diagnoses: Rheumatoid arthritis involving multiple sites with positive rheumatoid factor (HCC)  High risk medication use  Primary osteoarthritis of left knee  Iritis  Primary osteoarthritis of both hands  S/P total knee arthroplasty,  right  Essential hypertension  Orders: No orders of the defined types were placed in this encounter.  No orders of the defined types were placed in this encounter.   Face-to-face time spent with patient was *** minutes. Greater than 50% of time was spent in counseling and coordination of care.  Follow-Up Instructions: No follow-ups on file.   Gearldine Bienenstock, PA-C  Note - This record has been created using Dragon software.  Chart creation errors have been sought, but may not always  have been located. Such creation errors do not reflect on  the standard of medical care.

## 2023-03-28 ENCOUNTER — Ambulatory Visit: Payer: Managed Care, Other (non HMO)

## 2023-03-28 ENCOUNTER — Ambulatory Visit: Payer: Managed Care, Other (non HMO) | Admitting: Physician Assistant

## 2023-03-28 DIAGNOSIS — Z96651 Presence of right artificial knee joint: Secondary | ICD-10-CM

## 2023-03-28 DIAGNOSIS — M0579 Rheumatoid arthritis with rheumatoid factor of multiple sites without organ or systems involvement: Secondary | ICD-10-CM

## 2023-03-28 DIAGNOSIS — I10 Essential (primary) hypertension: Secondary | ICD-10-CM

## 2023-03-28 DIAGNOSIS — Z79899 Other long term (current) drug therapy: Secondary | ICD-10-CM

## 2023-03-28 DIAGNOSIS — H209 Unspecified iridocyclitis: Secondary | ICD-10-CM

## 2023-03-28 DIAGNOSIS — M1712 Unilateral primary osteoarthritis, left knee: Secondary | ICD-10-CM

## 2023-03-28 DIAGNOSIS — M19042 Primary osteoarthritis, left hand: Secondary | ICD-10-CM

## 2023-04-05 NOTE — Progress Notes (Unsigned)
Office Visit Note  Patient: Jennifer [name]             Date of Birth: 1976/09/02           MRN: [id number]             PCP: Madelin Headings, MD Referring: Madelin Headings, MD Visit Date: 04/16/2023 Occupation: @GUAROCC @  Subjective:  Medication monitoring   History of Present Illness: Jennifer [name] is a 47 y.o. female with history of seropositive rheumatoid arthritis and iritis.  Patient is prescribed Cimzia 400 mg sq injections in the office every 28 days. Her last dose of cimzia was administered on 02/21/23.  Patient states that she is overdue for her Cimzia injection due to being diagnosed with COVID-19 followed by bronchitis.  She is tolerating Cimzia without any side effects and is due for her next dose today.  She states that her symptoms of bronchitis have completely resolved with no recurrence over the past 2 weeks. Patient denies any signs or symptoms of an iritis flare recently.  She denies any increased joint pain or joint swelling at this time.  She denies taking any over-the-counter products for pain relief.  She has not had any morning stiffness, nocturnal pain, or difficulty with ADLs.  She denies any new medical conditions.   Activities of Daily Living:  Patient reports morning stiffness for 0 minutes.   Patient Denies nocturnal pain.  Difficulty dressing/grooming: Denies Difficulty climbing stairs: Denies Difficulty getting out of chair: Denies Difficulty using hands for taps, buttons, cutlery, and/or writing: Denies  Review of Systems  Constitutional:  Positive for fatigue.  HENT:  Positive for mouth dryness. Negative for mouth sores.   Eyes:  Negative for dryness.  Respiratory:  Negative for shortness of breath.   Cardiovascular:  Negative for chest pain and palpitations.  Gastrointestinal:  Negative for blood in stool, constipation and diarrhea.  Endocrine: Negative for increased urination.  Genitourinary:  Negative for involuntary urination.  Musculoskeletal:   Positive for joint pain and joint pain. Negative for gait problem, joint swelling, myalgias, muscle weakness, morning stiffness, muscle tenderness and myalgias.  Skin:  Negative for color change, rash, hair loss and sensitivity to sunlight.  Allergic/Immunologic: Negative for susceptible to infections.  Neurological:  Negative for dizziness and headaches.  Hematological:  Negative for swollen glands.  Psychiatric/Behavioral:  Positive for sleep disturbance. Negative for depressed mood. The patient is not nervous/anxious.     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   Primary osteoarthritis of both hands 02/04/2018   Sleep apnea, obstructive 09/28/2017   Primary osteoarthritis of both knees 05/22/2017   Chronic migraine without aura without status migrainosus, not intractable 01/18/2017   Incisional hernia 12/18/2016   Leiomyoma of uterus 09/06/2016   Anxiety disorder 03/29/2016   Depressive disorder 03/29/2016   Fibroid, uterine    Essential hypertension 05/25/2015   Recurrent headache 05/25/2015   Head lump 07/31/2014   Edema 12/29/2013   Baker's cyst, ruptured 12/25/2013   Cough, persistent 12/12/2013   Left leg swelling 12/12/2013  Cough 12/12/2013   High risk medication use 12/12/2013   Recurrent periodic urticaria    Acne vulgaris 11/21/2012   Postinflammatory hyperpigmentation 11/21/2012   Rheumatoid arthritis (HCC) 08/28/2012   CHRONIC LARYNGITIS 11/03/2010   ALLERGIC RHINITIS 11/03/2010   PARESTHESIA 07/28/2010   DYSMENORRHEA 10/15/2007    FIBROIDS, UTERUS 07/24/2007   OBESITY 07/24/2007   SLEEPLESSNESS 07/24/2007   HEADACHE 07/24/2007    Past Medical History:  Diagnosis Date   Acute otitis media 08/28/2012   improved  change to liquid medication    Anxiety disorder 03/29/2016   Breast abscess of female    Recurrent   Depressive disorder 03/29/2016   Family history of adverse reaction to anesthesia    father hard time waking once (12/20/2016)   Fibroids    w/bleeding   GERD (gastroesophageal reflux disease)    History of blood transfusion    after surgery   Hypertension    Migraines    Dr. Santiago Glad; "I have a few/month" (12/20/2016)   Osteoarthritis    Pill esophagitis 08/28/2012   amoxicillin  by hx  disc plan nl voice except hoarse not drooling  close fu  if not getting better with plan stop aleve  liquid ibu onlyf necessary    Recurrent periodic urticaria    Rheumatoid arthritis (HCC)    "55% of my body" (12/20/2016)   Rheumatoid arthritis (HCC)    Sleep apnea    wears cpap    Family History  Problem Relation Age of Onset   Hypertension Mother    Rheum arthritis Mother    Hypertension Father    Sleep apnea Father    Breast cancer Neg Hx    Colon cancer Neg Hx    Colon polyps Neg Hx    Esophageal cancer Neg Hx    Rectal cancer Neg Hx    Stomach cancer Neg Hx    Past Surgical History:  Procedure Laterality Date   BREAST SURGERY     abcess under left breast    CYST EXCISION Left 2014   "elbow"   HERNIA REPAIR     INCISION AND DRAINAGE BREAST ABSCESS Left 2003   INCISIONAL HERNIA REPAIR N/A 12/18/2016   Procedure: REPAIR INCISIONAL HERNIA WITH MESH;  Surgeon: Violeta Gelinas, MD;  Location: [facility];  Service: General;  Laterality: N/A;   INSERTION OF MESH N/A 12/18/2016   Procedure: INSERTION OF MESH;  Surgeon: Violeta Gelinas, MD;  Location: [facility];  Service: General;  Laterality: N/A;   KNEE ARTHROSCOPY WITH MENISCAL REPAIR Right 06/17/2018   Procedure: RIGHT KNEE ARTHROSCOPY WITH MENISCAL REPAIR;   Surgeon: Dannielle Huh, MD;  Location: [facility];  Service: Orthopedics;  Laterality: Right;   MYOMECTOMY N/A 09/06/2016   Procedure: Conan Bowens;  Surgeon: Fermin Schwab, MD;  Location: [facility];  Service: Gynecology;  Laterality: N/A;   ROOT CANAL  05/2022   TOTAL KNEE ARTHROPLASTY Right 03/28/2021   Procedure: TOTAL KNEE ARTHROPLASTY;  Surgeon: Ollen Gross, MD;  Location: [facility];  Service: Orthopedics;  Laterality: Right;    UTERINE FIBROID SURGERY     35 fibroids   Social History   Social History Narrative   Not on file   Immunization History  Administered Date(s) Administered   Influenza Split 08/21/2012   Influenza,inj,Quad PF,6+ Mos 07/31/2014, 08/19/2019   Pneumococcal Conjugate-13 08/28/2012     Objective: Vital Signs: BP (!) 131/91 (BP Location: Left Arm, Patient Position: Sitting, Cuff Size: Large)   Pulse 60   Resp 15  Ht 5\' 4"  (1.626 m)   Wt 266 lb 12.8 oz (121 kg)   BMI 45.80 kg/m    Physical Exam Vitals and nursing note reviewed.  Constitutional:      Appearance: She is well-developed.  HENT:     Head: Normocephalic and atraumatic.  Eyes:     Conjunctiva/sclera: Conjunctivae normal.  Cardiovascular:     Rate and Rhythm: Normal rate and regular rhythm.     Heart sounds: Normal heart sounds.  Pulmonary:     Effort: Pulmonary effort is normal.     Breath sounds: Normal breath sounds.  Abdominal:     General: Bowel sounds are normal.     Palpations: Abdomen is soft.  Musculoskeletal:     Cervical back: Normal range of motion.  Lymphadenopathy:     Cervical: No cervical adenopathy.  Skin:    General: Skin is warm and dry.     Capillary Refill: Capillary refill takes less than 2 seconds.  Neurological:     Mental Status: She is alert and oriented to person, place, and time.  Psychiatric:        Behavior: Behavior normal.      Musculoskeletal Exam: C-spine, thoracic spine, and lumbar spine good ROM.  Shoulder joints, elbow joints,  wrist joints, MCPs, PIPs, and DIPs good ROM with no synovitis.  Complete fist formation bilaterally.  Both hips have good ROM with no groin pain.  Left knee joint has good ROM with no warmth or effusion.  Right knee replacement has good ROM with effusion.  Ankle joints have good range of motion with no tenderness or joint swelling.  No tenderness or synovitis over MTP joints.  CDAI Exam: CDAI Score: -- Patient Global: 30 / 100; Provider Global: 30 / 100 Swollen: --; Tender: -- Joint Exam 04/16/2023   No joint exam has been documented for this visit   There is currently no information documented on the homunculus. Go to the Rheumatology activity and complete the homunculus joint exam.  Investigation: No additional findings.  Imaging: No results found.  Recent Labs: Lab Results  Component Value Date   WBC 4.0 11/02/2022   HGB 12.5 11/02/2022   PLT 315 11/02/2022   NA 140 11/02/2022   K 4.6 11/02/2022   CL 105 11/02/2022   CO2 29 11/02/2022   GLUCOSE 94 11/02/2022   BUN 15 11/02/2022   CREATININE 0.82 11/02/2022   BILITOT 0.5 11/02/2022   ALKPHOS 67 03/16/2021   AST 14 11/02/2022   ALT 9 11/02/2022   PROT 8.2 (H) 11/02/2022   ALBUMIN 3.8 03/16/2021   CALCIUM 9.3 11/02/2022   GFRAA 84 07/02/2020   QFTBGOLDPLUS NEGATIVE 06/06/2022    Speciality Comments: Prior therapy: Plaquenil (inadequate response)    Patient is needs to be seen in the office every 3 months to get Cimzia injections.  Procedures:  No procedures performed Allergies: Lisinopril and Lisinopril-hydrochlorothiazide   Assessment / Plan:     Visit Diagnoses: Rheumatoid arthritis involving multiple sites with positive rheumatoid factor (HCC) - +CCP, +HLA B27: She has no joint tenderness or synovitis on examination today.  She has not had any signs or symptoms of a rheumatoid arthritis flare.  She has not been experiencing any morning stiffness, nocturnal pain, or difficulty with ADLs.  She is prescribed  Cimzia 400 mg subcutaneous injections every 28 days.  Her last dose of Cimzia was administered on 02/21/2023.  She is overdue for her Cimzia injection due to having to postpone the administration after  being diagnosed with COVID-19 followed by bronchitis.  She has been asymptomatic for the past 2 weeks and is ready for her Cimzia injection today.  CBC and CMP will also be obtained today.  She will follow-up in the office in 5 months or sooner if needed.  High risk medication use - Cimzia 400 mg sq injections in the office every 28 days. (Rasuvo 20 mg sq injections, folic acid 1 mg 2 tablets daily.-Patient d/c due to hair loss). CBC and CMP updated on 11/02/22. Orders for CBC and CMP released today.  TB gold negative on 06/06/22. Future order for TB gold placed today.  Patient recently was diagnosed with COVID-19 followed by bronchitis.  Discussed the importance of holding cimzia if she develops signs or symptoms of an infection and to resume once the infection has completely cleared.   - Plan: CBC with Differential/Platelet, COMPLETE METABOLIC PANEL WITH GFR, QuantiFERON-TB Gold Plus  Screening for tuberculosis -Order for TB gold released today.  Plan: QuantiFERON-TB Gold Plus  Iritis: She has not had any signs or symptoms of an iritis flare.  No eye redness or photophobia at this time.  Primary osteoarthritis of both hands: No tenderness or synovitis noted on examination today.  Complete fist formation bilaterally.  Pain in right wrist: No tenderness or synovitis noted.  Primary osteoarthritis of left knee - s/p euflexxa left knee March 2024.  She has good range of motion of the left knee joint on examination today.  Patient would like to reapply for Visco gel injections for the left knee once eligible.  She will notify us when she would like [place] to initiate the renewal process.  S/P total knee arthroplasty, right - 03/28/21.  Doing well.  Good range of motion with no discomfort.  Essential  hypertension: Blood pressure was elevated today in the office and was rechecked prior to leaving.  Patient was advised to notify her PCP if her blood pressure remains elevated.  Patient states that she is due for an amlodipine refill and will reach out to her PCP.   Orders: Orders Placed This Encounter  Procedures   CBC with Differential/Platelet   COMPLETE METABOLIC PANEL WITH GFR   QuantiFERON-TB Gold Plus   Meds ordered this encounter  Medications   certolizumab pegol (CIMZIA) kit 400 mg     Follow-Up Instructions: Return in about 5 months (around 09/16/2023) for Rheumatoid arthritis.   Gearldine Bienenstock, PA-C  Note - This record has been created using Dragon software.  Chart creation errors have been sought, but may not always  have been located. Such creation errors do not reflect on  the standard of medical care.

## 2023-04-16 ENCOUNTER — Ambulatory Visit: Payer: Managed Care, Other (non HMO) | Attending: Physician Assistant | Admitting: Physician Assistant

## 2023-04-16 ENCOUNTER — Encounter: Payer: Self-pay | Admitting: Physician Assistant

## 2023-04-16 VITALS — BP 131/91 | HR 60 | Resp 15 | Ht 64.0 in | Wt 266.8 lb

## 2023-04-16 DIAGNOSIS — M19041 Primary osteoarthritis, right hand: Secondary | ICD-10-CM

## 2023-04-16 DIAGNOSIS — Z111 Encounter for screening for respiratory tuberculosis: Secondary | ICD-10-CM

## 2023-04-16 DIAGNOSIS — Z79899 Other long term (current) drug therapy: Secondary | ICD-10-CM

## 2023-04-16 DIAGNOSIS — H209 Unspecified iridocyclitis: Secondary | ICD-10-CM

## 2023-04-16 DIAGNOSIS — M25531 Pain in right wrist: Secondary | ICD-10-CM

## 2023-04-16 DIAGNOSIS — M0579 Rheumatoid arthritis with rheumatoid factor of multiple sites without organ or systems involvement: Secondary | ICD-10-CM | POA: Diagnosis not present

## 2023-04-16 DIAGNOSIS — M1712 Unilateral primary osteoarthritis, left knee: Secondary | ICD-10-CM

## 2023-04-16 DIAGNOSIS — Z96651 Presence of right artificial knee joint: Secondary | ICD-10-CM

## 2023-04-16 DIAGNOSIS — I10 Essential (primary) hypertension: Secondary | ICD-10-CM

## 2023-04-16 DIAGNOSIS — M19042 Primary osteoarthritis, left hand: Secondary | ICD-10-CM

## 2023-04-16 LAB — CBC WITH DIFFERENTIAL/PLATELET
Absolute Monocytes: 451 cells/uL (ref 200–950)
Basophils Absolute: 29 cells/uL (ref 0–200)
Basophils Relative: 0.6 %
Eosinophils Absolute: 98 cells/uL (ref 15–500)
Eosinophils Relative: 2 %
HCT: 38.7 % (ref 35.0–45.0)
Hemoglobin: 12.7 g/dL (ref 11.7–15.5)
Lymphs Abs: 3283 cells/uL (ref 850–3900)
MCH: 30.3 pg (ref 27.0–33.0)
MCHC: 32.8 g/dL (ref 32.0–36.0)
MCV: 92.4 fL (ref 80.0–100.0)
MPV: 10.7 fL (ref 7.5–12.5)
Monocytes Relative: 9.2 %
Neutro Abs: 1039 cells/uL — ABNORMAL LOW (ref 1500–7800)
Neutrophils Relative %: 21.2 %
Platelets: 269 10*3/uL (ref 140–400)
RBC: 4.19 10*6/uL (ref 3.80–5.10)
RDW: 12.7 % (ref 11.0–15.0)
Total Lymphocyte: 67 %
WBC: 4.9 10*3/uL (ref 3.8–10.8)

## 2023-04-16 MED ORDER — CERTOLIZUMAB PEGOL 2 X 200 MG ~~LOC~~ KIT
400.0000 mg | PACK | Freq: Once | SUBCUTANEOUS | Status: AC
Start: 2023-04-16 — End: 2023-04-16
  Administered 2023-04-16: 400 mg via SUBCUTANEOUS

## 2023-04-16 NOTE — Patient Instructions (Signed)

## 2023-04-16 NOTE — Progress Notes (Signed)
Subjective:   Patient presents to clinic today to receive monthly dose of Cimzia.  Patient running a fever or have signs/symptoms of infection? No  Patient currently on antibiotics for the treatment of infection? No  Patient have any upcoming invasive procedures/surgeries? No  Objective: CMP     Component Value Date/Time   NA 140 11/02/2022 0819   NA 138 06/11/2018 1212   K 4.6 11/02/2022 0819   CL 105 11/02/2022 0819   CO2 29 11/02/2022 0819   GLUCOSE 94 11/02/2022 0819   BUN 15 11/02/2022 0819   BUN 9 06/11/2018 1212   CREATININE 0.82 11/02/2022 0819   CALCIUM 9.3 11/02/2022 0819   PROT 8.2 (H) 11/02/2022 0819   PROT 8.0 06/11/2018 1212   ALBUMIN 3.8 03/16/2021 1417   ALBUMIN 3.8 06/11/2018 1212   AST 14 11/02/2022 0819   ALT 9 11/02/2022 0819   ALKPHOS 67 03/16/2021 1417   BILITOT 0.5 11/02/2022 0819   BILITOT 0.3 06/11/2018 1212   GFRNONAA >60 03/30/2021 0310   GFRNONAA 72 07/02/2020 1106   GFRAA 84 07/02/2020 1106    CBC    Component Value Date/Time   WBC 4.0 11/02/2022 0819   RBC 4.15 11/02/2022 0819   HGB 12.5 11/02/2022 0819   HGB 11.9 06/11/2018 1212   HCT 37.5 11/02/2022 0819   HCT 36.6 06/11/2018 1212   PLT 315 11/02/2022 0819   PLT 310 06/11/2018 1212   MCV 90.4 11/02/2022 0819   MCV 93 06/11/2018 1212   MCH 30.1 11/02/2022 0819   MCHC 33.3 11/02/2022 0819   RDW 12.3 11/02/2022 0819   RDW 14.4 06/11/2018 1212   LYMPHSABS 1,924 11/02/2022 0819   LYMPHSABS 2.3 06/11/2018 1212   MONOABS 0.5 02/28/2021 1614   EOSABS 160 11/02/2022 0819   EOSABS 0.2 06/11/2018 1212   BASOSABS 20 11/02/2022 0819   BASOSABS 0.0 06/11/2018 1212    Baseline Immunosuppressant Therapy Labs TB GOLD    Latest Ref Rng & Units 06/06/2022    9:09 AM  Quantiferon TB Gold  Quantiferon TB Gold Plus NEGATIVE NEGATIVE    Hepatitis Panel    Latest Ref Rng & Units 06/13/2019    2:46 PM  Hepatitis  Hep B Surface Ag NON-REACTI NON-REACTIVE   Hep B IgM NON-REACTI  NON-REACTIVE   Hep C Ab NON-REACTI NON-REACTIVE   Hep A IgM NON-REACTI NON-REACTIVE    HIV Lab Results  Component Value Date   HIV NON-REACTIVE 02/18/2020   Immunoglobulins    Latest Ref Rng & Units 02/18/2020    9:04 AM  Immunoglobulin Electrophoresis  IgA  47 - 310 mg/dL 161   IgG 096 - 0,454 mg/dL 0,981   IgM 50 - 191 mg/dL 78    SPEP    Latest Ref Rng & Units 11/02/2022    8:19 AM  Serum Protein Electrophoresis  Total Protein 6.1 - 8.1 g/dL 8.2    Y7WG No results found for: "G6PDH" TPMT No results found for: "TPMT"   Chest x-ray: 01/27/2022 No acute cardiopulmonary process   Assessment/Plan:   Administrations This Visit     certolizumab pegol (CIMZIA) kit 400 mg     Admin Date 04/16/2023 Action Given Dose 400 mg Route Subcutaneous Documented By Henriette Combs, LPN             Patient tolerated injection well.   Appointment for next injection scheduled for 05/17/2023.  Patient due for labs today.  Patient is to call and reschedule appointment if running a  fever with signs/symptoms of infection, on antibiotics for active infection or has an upcoming invasive procedure.  All questions encouraged and answered.  Instructed patient to call with any further questions or concerns.

## 2023-04-17 NOTE — Progress Notes (Signed)
Globulin is slightly elevated but has improved. Rest of CMP WNL.  Absolute neutrophils remain slightly low. Rest of CBC Wnl.  We will continue to monitor.

## 2023-05-17 ENCOUNTER — Ambulatory Visit: Payer: Managed Care, Other (non HMO) | Attending: Rheumatology | Admitting: *Deleted

## 2023-05-17 VITALS — BP 131/87 | HR 77

## 2023-05-17 DIAGNOSIS — M0579 Rheumatoid arthritis with rheumatoid factor of multiple sites without organ or systems involvement: Secondary | ICD-10-CM

## 2023-05-17 MED ORDER — CERTOLIZUMAB PEGOL 2 X 200 MG ~~LOC~~ KIT
400.0000 mg | PACK | Freq: Once | SUBCUTANEOUS | Status: AC
Start: 2023-05-17 — End: 2023-05-17
  Administered 2023-05-17: 400 mg via SUBCUTANEOUS

## 2023-05-17 NOTE — Progress Notes (Signed)
Subjective:   Patient presents to clinic today to receive monthly dose of Cimzia.  Patient running a fever or have signs/symptoms of infection? No  Patient currently on antibiotics for the treatment of infection? No  Patient have any upcoming invasive procedures/surgeries? No  Objective: CMP     Component Value Date/Time   NA 138 04/16/2023 1527   NA 138 06/11/2018 1212   K 4.6 04/16/2023 1527   CL 103 04/16/2023 1527   CO2 24 04/16/2023 1527   GLUCOSE 85 04/16/2023 1527   BUN 16 04/16/2023 1527   BUN 9 06/11/2018 1212   CREATININE 0.77 04/16/2023 1527   CALCIUM 9.4 04/16/2023 1527   PROT 8.1 04/16/2023 1527   PROT 8.0 06/11/2018 1212   ALBUMIN 3.8 03/16/2021 1417   ALBUMIN 3.8 06/11/2018 1212   AST 20 04/16/2023 1527   ALT 14 04/16/2023 1527   ALKPHOS 67 03/16/2021 1417   BILITOT 0.5 04/16/2023 1527   BILITOT 0.3 06/11/2018 1212   GFRNONAA >60 03/30/2021 0310   GFRNONAA 72 07/02/2020 1106   GFRAA 84 07/02/2020 1106    CBC    Component Value Date/Time   WBC 4.9 04/16/2023 1527   RBC 4.19 04/16/2023 1527   HGB 12.7 04/16/2023 1527   HGB 11.9 06/11/2018 1212   HCT 38.7 04/16/2023 1527   HCT 36.6 06/11/2018 1212   PLT 269 04/16/2023 1527   PLT 310 06/11/2018 1212   MCV 92.4 04/16/2023 1527   MCV 93 06/11/2018 1212   MCH 30.3 04/16/2023 1527   MCHC 32.8 04/16/2023 1527   RDW 12.7 04/16/2023 1527   RDW 14.4 06/11/2018 1212   LYMPHSABS 3,283 04/16/2023 1527   LYMPHSABS 2.3 06/11/2018 1212   MONOABS 0.5 02/28/2021 1614   EOSABS 98 04/16/2023 1527   EOSABS 0.2 06/11/2018 1212   BASOSABS 29 04/16/2023 1527   BASOSABS 0.0 06/11/2018 1212    Baseline Immunosuppressant Therapy Labs TB GOLD    Latest Ref Rng & Units 06/06/2022    9:09 AM  Quantiferon TB Gold  Quantiferon TB Gold Plus NEGATIVE NEGATIVE    Hepatitis Panel    Latest Ref Rng & Units 06/13/2019    2:46 PM  Hepatitis  Hep B Surface Ag NON-REACTI NON-REACTIVE   Hep B IgM NON-REACTI NON-REACTIVE    Hep C Ab NON-REACTI NON-REACTIVE   Hep A IgM NON-REACTI NON-REACTIVE    HIV Lab Results  Component Value Date   HIV NON-REACTIVE 02/18/2020   Immunoglobulins    Latest Ref Rng & Units 02/18/2020    9:04 AM  Immunoglobulin Electrophoresis  IgA  47 - 310 mg/dL 119   IgG 147 - 8,295 mg/dL 6,213   IgM 50 - 086 mg/dL 78    SPEP    Latest Ref Rng & Units 04/16/2023    3:27 PM  Serum Protein Electrophoresis  Total Protein 6.1 - 8.1 g/dL 8.1    V7QI No results found for: "G6PDH" TPMT No results found for: "TPMT"   Chest x-ray: 01/27/2022 No acute cardiopulmonary process   Assessment/Plan:   Administrations This Visit     certolizumab pegol (CIMZIA) kit 400 mg     Admin Date 05/17/2023 Action Given Dose 400 mg Route Subcutaneous Documented By Henriette Combs, LPN             Patient tolerated injection well.   Appointment for next injection scheduled for 06/21/2023 as patient will be out of town on 06/14/2023 when her injection is due.   Patient due  for labs in October 2024.  Patient is to call and reschedule appointment if running a fever with signs/symptoms of infection, on antibiotics for active infection or has an upcoming invasive procedure.  All questions encouraged and answered.  Instructed patient to call with any further questions or concerns.

## 2023-05-30 ENCOUNTER — Encounter (HOSPITAL_COMMUNITY): Payer: Self-pay | Admitting: Gastroenterology

## 2023-06-05 ENCOUNTER — Telehealth: Payer: Self-pay | Admitting: Gastroenterology

## 2023-06-05 NOTE — Telephone Encounter (Signed)
Patient called stating that she needed to cancel her procedure on 9/16  at [facility] due to being in an accident and not feeling well. Patient stated she will contact out office to reschedule at a later time. Please advise.

## 2023-06-05 NOTE — Telephone Encounter (Signed)
FYI... Procedure has been cancelled

## 2023-06-05 NOTE — Telephone Encounter (Signed)
Jennifer [name], I am sorry to hear this. This patient has now canceled on 2 separate occasions for hospital-based procedure time. At this point, if she calls to reschedule, she can be seen by any of the MDs as she was a Dr. Orvan Falconer attempted colonoscopy but could not be done due to IV issues and such. This notation will make it as such. Thanks. GM

## 2023-06-07 ENCOUNTER — Encounter (HOSPITAL_COMMUNITY): Admission: RE | Payer: Self-pay | Source: Home / Self Care

## 2023-06-07 ENCOUNTER — Ambulatory Visit (HOSPITAL_COMMUNITY)
Admission: RE | Admit: 2023-06-07 | Payer: Managed Care, Other (non HMO) | Source: Home / Self Care | Admitting: Gastroenterology

## 2023-06-07 SURGERY — COLONOSCOPY WITH PROPOFOL
Anesthesia: Monitor Anesthesia Care

## 2023-06-19 NOTE — Progress Notes (Unsigned)
No chief complaint on file.   HPI: Patient  Jennifer [name]  47 y.o. comes in today for Preventive Health Care visit   Health Maintenance  Topic Date Due   COVID-19 Vaccine (1) Never done   DTaP/Tdap/Td (1 - Tdap) Never done   Cervical Cancer Screening (HPV/Pap Cotest)  Never done   Colonoscopy  Never done   INFLUENZA VACCINE  04/19/2023   Hepatitis C Screening  Completed   HIV Screening  Completed   HPV VACCINES  Aged Out   Health Maintenance Review LIFESTYLE:  Exercise:   Tobacco/ETS: Alcohol:  Sugar beverages: Sleep: Drug use: no HH of  Work:    ROS:  GEN/ HEENT: No fever, significant weight changes sweats headaches vision problems hearing changes, CV/ PULM; No chest pain shortness of breath cough, syncope,edema  change in exercise tolerance. GI /GU: No adominal pain, vomiting, change in bowel habits. No blood in the stool. No significant GU symptoms. SKIN/HEME: ,no acute skin rashes suspicious lesions or bleeding. No lymphadenopathy, nodules, masses.  NEURO/ PSYCH:  No neurologic signs such as weakness numbness. No depression anxiety. IMM/ Allergy: No unusual infections.  Allergy .   REST of 12 system review negative except as per HPI   Past Medical History:  Diagnosis Date   Acute otitis media 08/28/2012   improved  change to liquid medication    Anxiety disorder 03/29/2016   Breast abscess of female    Recurrent   Depressive disorder 03/29/2016   Family history of adverse reaction to anesthesia    father hard time waking once (12/20/2016)   Fibroids    w/bleeding   GERD (gastroesophageal reflux disease)    History of blood transfusion    after surgery   Hypertension    Migraines    Dr. Santiago Glad; "I have a few/month" (12/20/2016)   Osteoarthritis    Pill esophagitis 08/28/2012   amoxicillin  by hx  disc plan nl voice except hoarse not drooling  close fu  if not getting better with plan stop aleve  liquid ibu onlyf necessary    Recurrent periodic  urticaria    Rheumatoid arthritis (HCC)    "55% of my body" (12/20/2016)   Rheumatoid arthritis (HCC)    Sleep apnea    wears cpap    Past Surgical History:  Procedure Laterality Date   BREAST SURGERY     abcess under left breast    CYST EXCISION Left 2014   "elbow"   HERNIA REPAIR     INCISION AND DRAINAGE BREAST ABSCESS Left 2003   INCISIONAL HERNIA REPAIR N/A 12/18/2016   Procedure: REPAIR INCISIONAL HERNIA WITH MESH;  Surgeon: Violeta Gelinas, MD;  Location: [facility] OR;  Service: General;  Laterality: N/A;   INSERTION OF MESH N/A 12/18/2016   Procedure: INSERTION OF MESH;  Surgeon: Violeta Gelinas, MD;  Location: [facility] OR;  Service: General;  Laterality: N/A;   KNEE ARTHROSCOPY WITH MENISCAL REPAIR Right 06/17/2018   Procedure: RIGHT KNEE ARTHROSCOPY WITH MENISCAL REPAIR;  Surgeon: Dannielle Huh, MD;  Location: [facility] ORS;  Service: Orthopedics;  Laterality: Right;   MYOMECTOMY N/A 09/06/2016   Procedure: Conan Bowens;  Surgeon: Fermin Schwab, MD;  Location: [facility] ORS;  Service: Gynecology;  Laterality: N/A;   ROOT CANAL  05/2022   TOTAL KNEE ARTHROPLASTY Right 03/28/2021   Procedure: TOTAL KNEE ARTHROPLASTY;  Surgeon: Ollen Gross, MD;  Location: [facility] ORS;  Service: Orthopedics;  Laterality: Right;    UTERINE FIBROID SURGERY  34 fibroids    Family History  Problem Relation Age of Onset   Hypertension Mother    Rheum arthritis Mother    Hypertension Father    Sleep apnea Father    Breast cancer Neg Hx    Colon cancer Neg Hx    Colon polyps Neg Hx    Esophageal cancer Neg Hx    Rectal cancer Neg Hx    Stomach cancer Neg Hx     Social History   Socioeconomic History   Marital status: Single    Spouse name: Not on file   Number of children: Not on file   Years of education: Not on file   Highest education level: Some college, no degree  Occupational History   Not on file  Tobacco Use   Smoking status: Never    Passive exposure: Never   Smokeless tobacco:  Never  Vaping Use   Vaping status: Never Used  Substance and Sexual Activity   Alcohol use: Yes    Comment: social   Drug use: Never   Sexual activity: Not on file  Other Topics Concern   Not on file  Social History Narrative   Not on file   Social Determinants of Health   Financial Resource Strain: Medium Risk (11/03/2021)   Overall Financial Resource Strain (CARDIA)    Difficulty of Paying Living Expenses: Somewhat hard  Food Insecurity: Unknown (11/03/2021)   Hunger Vital Sign    Worried About Running Out of Food in the Last Year: Patient declined    Ran Out of Food in the Last Year: Never true  Transportation Needs: No Transportation Needs (11/03/2021)   PRAPARE - Administrator, Civil Service (Medical): No    Lack of Transportation (Non-Medical): No  Physical Activity: Insufficiently Active (11/03/2021)   Exercise Vital Sign    Days of Exercise per Week: 3 days    Minutes of Exercise per Session: 10 min  Stress: Stress Concern Present (11/03/2021)   [employer] of Occupational Health - Occupational Stress Questionnaire    Feeling of Stress : Rather much  Social Connections: Moderately Integrated (11/03/2021)   Social Connection and Isolation Panel [NHANES]    Frequency of Communication with Friends and Family: More than three times a week    Frequency of Social Gatherings with Friends and Family: Once a week    Attends Religious Services: 1 to 4 times per year    Active Member of [employer] or Organizations: Yes    Attends Engineer, structural: More than 4 times per year    Marital Status: Never married    Outpatient Medications Prior to Visit  Medication Sig Dispense Refill   amLODipine (NORVASC) 2.5 MG tablet Take 1 tablet (2.5 mg total) by mouth daily. In addition to 5 mg amlodipine ( total 7.5 mg per day) 90 tablet 1   benzonatate (TESSALON) 100 MG capsule Take 1 capsule (100 mg total) by mouth every 8 (eight) hours. (Patient not taking: Reported  on 04/16/2023) 21 capsule 0   certolizumab pegol (CIMZIA) 2 X 200 MG KIT Inject 400 mg into the skin every 28 (twenty-eight) days.     cetirizine (ZYRTEC) 10 MG tablet Take 10 mg by mouth daily as needed for allergies.     clindamycin-benzoyl peroxide (BENZACLIN) gel Apply 1 Application topically as needed.     ibuprofen (ADVIL) 800 MG tablet Take 1 tablet (800 mg total) by mouth every 8 (eight) hours as needed (pain). (Patient not taking:  Reported on 04/16/2023) 21 tablet 0   ipratropium (ATROVENT) 0.03 % nasal spray Place 2 sprays into both nostrils 2 (two) times daily. 30 mL 0   levocetirizine (XYZAL) 5 MG tablet Take 1 tablet (5 mg total) by mouth every evening. 30 tablet 0   Levonorgestrel (KYLEENA) 19.5 MG IUD 19.5 mg by Intrauterine route once.     methocarbamol (ROBAXIN) 500 MG tablet Take 1 tablet (500 mg total) by mouth every 8 (eight) hours as needed for muscle spasms. 20 tablet 0   naproxen (NAPROSYN) 500 MG tablet Take 500 mg by mouth as needed. (Patient not taking: Reported on 04/16/2023)     NON FORMULARY Pt uses cpap nightly     nystatin-triamcinolone ointment (MYCOLOG) Apply 1 Application topically as needed.     prednisoLONE acetate (PRED FORTE) 1 % ophthalmic suspension Place 1 drop into the left eye 2 (two) times daily. As needed     ramelteon (ROZEREM) 8 MG tablet TAKE 1 TABLET BY MOUTH AT BEDTIME. (Patient not taking: Reported on 04/16/2023) 90 tablet 1   SUMAtriptan (TOSYMRA) 10 MG/ACT SOLN Place 10 mg into the nose once as needed for up to 1 dose. May repeat after 1 hour.  Maximum 2 sprays in 24 hours. 6 each 11   triamcinolone (NASACORT) 55 MCG/ACT AERO nasal inhaler  (Patient not taking: Reported on 04/16/2023)     Facility-Administered Medications Prior to Visit  Medication Dose Route Frequency Provider Last Rate Last Admin   0.9 %  sodium chloride infusion  500 mL Intravenous Continuous Tressia Danas, MD         EXAM:  There were no vitals taken for this  visit.  There is no height or weight on file to calculate BMI. Wt Readings from Last 3 Encounters:  04/16/23 266 lb 12.8 oz (121 kg)  02/28/23 250 lb (113.4 kg)  12/29/22 238 lb (108 kg)    Physical Exam: Vital signs reviewed LKG:MWNU is a well-developed well-nourished alert cooperative    who appearsr stated age in no acute distress.  HEENT: normocephalic atraumatic , Eyes: PERRL EOM's full, conjunctiva clear, Nares: paten,t no deformity discharge or tenderness., Ears: no deformity EAC's clear TMs with normal landmarks. Mouth: clear OP, no lesions, edema.  Moist mucous membranes. Dentition in adequate repair. NECK: supple without masses, thyromegaly or bruits. CHEST/PULM:  Clear to auscultation and percussion breath sounds equal no wheeze , rales or rhonchi. No chest wall deformities or tenderness. Breast: normal by inspection . No dimpling, discharge, masses, tenderness or discharge . CV: PMI is nondisplaced, S1 S2 no gallops, murmurs, rubs. Peripheral pulses are full without delay.No JVD .  ABDOMEN: Bowel sounds normal nontender  No guard or rebound, no hepato splenomegal no CVA tenderness.  No hernia. Extremtities:  No clubbing cyanosis or edema, no acute joint swelling or redness no focal atrophy NEURO:  Oriented x3, cranial nerves 3-12 appear to be intact, no obvious focal weakness,gait within normal limits no abnormal reflexes or asymmetrical SKIN: No acute rashes normal turgor, color, no bruising or petechiae. PSYCH: Oriented, good eye contact, no obvious depression anxiety, cognition and judgment appear normal. LN: no cervical axillary adenopathy  Lab Results  Component Value Date   WBC 4.9 04/16/2023   HGB 12.7 04/16/2023   HCT 38.7 04/16/2023   PLT 269 04/16/2023   GLUCOSE 85 04/16/2023   CHOL 173 11/02/2022   TRIG 37 11/02/2022   HDL 80 11/02/2022   LDLCALC 82 11/02/2022   ALT 14 04/16/2023  AST 20 04/16/2023   NA 138 04/16/2023   K 4.6 04/16/2023   CL 103  04/16/2023   CREATININE 0.77 04/16/2023   BUN 16 04/16/2023   CO2 24 04/16/2023   TSH 1.46 07/02/2020   INR 1.1 (H) 02/28/2021   HGBA1C 5.6 11/02/2022    BP Readings from Last 3 Encounters:  05/17/23 131/87  04/16/23 (!) 131/91  02/28/23 (!) 159/90    Lab results reviewed with patient   ASSESSMENT AND PLAN:  Discussed the following assessment and plan:    ICD-10-CM   1. Visit for preventive health examination  Z00.00     2. Medication management  Z79.899     3. Essential hypertension  I10     4. Sleep apnea, obstructive  G47.33     5. Rheumatoid arthritis involving multiple sites with positive rheumatoid factor (HCC)  M05.79     6. OSA (obstructive sleep apnea)  G47.33      No follow-ups on file.  Patient Care Team: Alexsa Flaum, Neta Mends, MD as PCP - General Richardean Chimera, MD as Attending Physician (Obstetrics and Gynecology) Pollyann Savoy, MD (Rheumatology) Christia Reading, MD as Attending Physician (Otolaryngology) Basilio Cairo, MD (Dermatology) Valeria Batman, MD (Inactive) as Attending Physician (Orthopedic Surgery) Drema Dallas, DO as Consulting Physician (Neurology) There are no Patient Instructions on file for this visit.  Neta Mends. Katherina Wimer M.D.

## 2023-06-20 ENCOUNTER — Encounter: Payer: Self-pay | Admitting: Internal Medicine

## 2023-06-20 ENCOUNTER — Ambulatory Visit: Payer: Managed Care, Other (non HMO) | Admitting: Internal Medicine

## 2023-06-20 VITALS — BP 130/78 | HR 60 | Temp 98.1°F | Ht 64.1 in | Wt 274.2 lb

## 2023-06-20 DIAGNOSIS — M0579 Rheumatoid arthritis with rheumatoid factor of multiple sites without organ or systems involvement: Secondary | ICD-10-CM

## 2023-06-20 DIAGNOSIS — R7301 Impaired fasting glucose: Secondary | ICD-10-CM | POA: Diagnosis not present

## 2023-06-20 DIAGNOSIS — I10 Essential (primary) hypertension: Secondary | ICD-10-CM | POA: Diagnosis not present

## 2023-06-20 DIAGNOSIS — Z0000 Encounter for general adult medical examination without abnormal findings: Secondary | ICD-10-CM | POA: Diagnosis not present

## 2023-06-20 DIAGNOSIS — G4733 Obstructive sleep apnea (adult) (pediatric): Secondary | ICD-10-CM | POA: Diagnosis not present

## 2023-06-20 DIAGNOSIS — Z79899 Other long term (current) drug therapy: Secondary | ICD-10-CM | POA: Diagnosis not present

## 2023-06-20 LAB — POCT GLYCOSYLATED HEMOGLOBIN (HGB A1C): Hemoglobin A1C: 5.4 % (ref 4.0–5.6)

## 2023-06-20 NOTE — Patient Instructions (Addendum)
Good to see you today. Get back on some sort of tracking to help with weight. You can contact insurance about wegovy coverage and indications they will  accept.  You can consider weight management  program but that is a monthly commitment  and uncertain if covered.

## 2023-06-21 ENCOUNTER — Ambulatory Visit: Payer: Managed Care, Other (non HMO) | Attending: Rheumatology | Admitting: *Deleted

## 2023-06-21 VITALS — BP 148/95 | HR 73

## 2023-06-21 DIAGNOSIS — M0579 Rheumatoid arthritis with rheumatoid factor of multiple sites without organ or systems involvement: Secondary | ICD-10-CM

## 2023-06-21 MED ORDER — CERTOLIZUMAB PEGOL 2 X 200 MG ~~LOC~~ KIT
400.0000 mg | PACK | Freq: Once | SUBCUTANEOUS | Status: AC
Start: 2023-06-21 — End: 2023-06-21
  Administered 2023-06-21: 400 mg via SUBCUTANEOUS

## 2023-06-21 NOTE — Progress Notes (Signed)
Subjective:   Patient presents to clinic today to receive monthly dose of Cimzia.  Patient running a fever or have signs/symptoms of infection? No  Patient currently on antibiotics for the treatment of infection? No  Patient have any upcoming invasive procedures/surgeries? No  Objective: CMP     Component Value Date/Time   NA 138 04/16/2023 1527   NA 138 06/11/2018 1212   K 4.6 04/16/2023 1527   CL 103 04/16/2023 1527   CO2 24 04/16/2023 1527   GLUCOSE 85 04/16/2023 1527   BUN 16 04/16/2023 1527   BUN 9 06/11/2018 1212   CREATININE 0.77 04/16/2023 1527   CALCIUM 9.4 04/16/2023 1527   PROT 8.1 04/16/2023 1527   PROT 8.0 06/11/2018 1212   ALBUMIN 3.8 03/16/2021 1417   ALBUMIN 3.8 06/11/2018 1212   AST 20 04/16/2023 1527   ALT 14 04/16/2023 1527   ALKPHOS 67 03/16/2021 1417   BILITOT 0.5 04/16/2023 1527   BILITOT 0.3 06/11/2018 1212   GFRNONAA >60 03/30/2021 0310   GFRNONAA 72 07/02/2020 1106   GFRAA 84 07/02/2020 1106    CBC    Component Value Date/Time   WBC 4.9 04/16/2023 1527   RBC 4.19 04/16/2023 1527   HGB 12.7 04/16/2023 1527   HGB 11.9 06/11/2018 1212   HCT 38.7 04/16/2023 1527   HCT 36.6 06/11/2018 1212   PLT 269 04/16/2023 1527   PLT 310 06/11/2018 1212   MCV 92.4 04/16/2023 1527   MCV 93 06/11/2018 1212   MCH 30.3 04/16/2023 1527   MCHC 32.8 04/16/2023 1527   RDW 12.7 04/16/2023 1527   RDW 14.4 06/11/2018 1212   LYMPHSABS 3,283 04/16/2023 1527   LYMPHSABS 2.3 06/11/2018 1212   MONOABS 0.5 02/28/2021 1614   EOSABS 98 04/16/2023 1527   EOSABS 0.2 06/11/2018 1212   BASOSABS 29 04/16/2023 1527   BASOSABS 0.0 06/11/2018 1212    Baseline Immunosuppressant Therapy Labs TB GOLD    Latest Ref Rng & Units 06/06/2022    9:09 AM  Quantiferon TB Gold  Quantiferon TB Gold Plus NEGATIVE NEGATIVE    Hepatitis Panel    Latest Ref Rng & Units 06/13/2019    2:46 PM  Hepatitis  Hep B Surface Ag NON-REACTI NON-REACTIVE   Hep B IgM NON-REACTI NON-REACTIVE    Hep C Ab NON-REACTI NON-REACTIVE   Hep A IgM NON-REACTI NON-REACTIVE    HIV Lab Results  Component Value Date   HIV NON-REACTIVE 02/18/2020   Immunoglobulins    Latest Ref Rng & Units 02/18/2020    9:04 AM  Immunoglobulin Electrophoresis  IgA  47 - 310 mg/dL 742   IgG 595 - 6,387 mg/dL 5,643   IgM 50 - 329 mg/dL 78    SPEP    Latest Ref Rng & Units 04/16/2023    3:27 PM  Serum Protein Electrophoresis  Total Protein 6.1 - 8.1 g/dL 8.1    J1OA No results found for: "G6PDH" TPMT No results found for: "TPMT"   Chest x-ray: 01/27/2022 No acute cardiopulmonary process   Assessment/Plan:   Administrations This Visit     certolizumab pegol (CIMZIA) kit 400 mg     Admin Date 06/21/2023 Action Given Dose 400 mg Route Subcutaneous Documented By Henriette Combs, LPN             Patient tolerated injection well.   Appointment for next injection scheduled for 07/19/2023.  Patient due for labs in July 17, 2023.  Patient is to call and reschedule appointment if  running a fever with signs/symptoms of infection, on antibiotics for active infection or has an upcoming invasive procedure.  All questions encouraged and answered.  Instructed patient to call with any further questions or concerns.

## 2023-07-04 ENCOUNTER — Encounter: Payer: Self-pay | Admitting: Adult Health

## 2023-07-04 ENCOUNTER — Ambulatory Visit (INDEPENDENT_AMBULATORY_CARE_PROVIDER_SITE_OTHER): Payer: Managed Care, Other (non HMO) | Admitting: Adult Health

## 2023-07-04 VITALS — BP 126/70 | HR 84 | Ht 64.0 in | Wt 279.4 lb

## 2023-07-04 DIAGNOSIS — G4733 Obstructive sleep apnea (adult) (pediatric): Secondary | ICD-10-CM | POA: Diagnosis not present

## 2023-07-04 DIAGNOSIS — Z23 Encounter for immunization: Secondary | ICD-10-CM | POA: Diagnosis not present

## 2023-07-04 NOTE — Patient Instructions (Signed)
Wear CPAP At bedtime all night long .  Keep up good work.  Work on healthy weight loss  Do not drive if sleepy .  Flu shot .  Follow up in 1 year and As needed

## 2023-07-04 NOTE — Progress Notes (Signed)
@Patient  ID: Jennifer [name], female    DOB: 07-02-1976, 47 y.o.   MRN: [id number]  Chief Complaint  Patient presents with   Follow-up    Referring provider: Madelin Headings, MD  HPI: 47 year old female seen for sleep consult February 23, 2022 to establish for sleep apnea History is significant for rheumatoid arthritis and migraine headaches  TEST/EVENTS :  positive HST 06/2017  for OSA with an AHI=53.8    07/04/2023 Follow up : OSA  Patient presents for a 1 year follow-up.  Patient has severe obstructive sleep apnea.  She is on nocturnal CPAP.  Patient says she wears her CPAP every single night.  Feels that she benefits from CPAP with decreased daytime sleepiness.  CPAP download shows excellent compliance with 100% usage.  Daily average usage at 7.5 hours.  Patient is on auto CPAP 5 to 15 cm H2O.  Daily average pressure at 14.7 cm H2O.  AHI 2.1/hour.  Minimum leaks. Uses a full face mask .  We discussed changing to the DreamWear fullface mask for comfort.  She is recently got a new CPAP machine.   Allergies  Allergen Reactions   Lisinopril Anaphylaxis   Lisinopril-Hydrochlorothiazide Swelling and Other (See Comments)    Angioedema  Has tolerated maxide in past so most likely  The ACE inhibitor as the cause     Immunization History  Administered Date(s) Administered   Influenza Split 08/21/2012   Influenza,inj,Quad PF,6+ Mos 07/31/2014, 08/19/2019   Pneumococcal Conjugate-13 08/28/2012   Unspecified SARS-COV-2 Vaccination 01/09/2020, 01/30/2020, 09/24/2020    Past Medical History:  Diagnosis Date   Acute otitis media 08/28/2012   improved  change to liquid medication    Anxiety disorder 03/29/2016   Breast abscess of female    Recurrent   Depressive disorder 03/29/2016   Family history of adverse reaction to anesthesia    father hard time waking once (12/20/2016)   Fibroids    w/bleeding   GERD (gastroesophageal reflux disease)    History of blood transfusion    after  surgery   Hypertension    Migraines    Dr. Santiago Glad; "I have a few/month" (12/20/2016)   Osteoarthritis    Pill esophagitis 08/28/2012   amoxicillin  by hx  disc plan nl voice except hoarse not drooling  close fu  if not getting better with plan stop aleve  liquid ibu onlyf necessary    Recurrent periodic urticaria    Rheumatoid arthritis (HCC)    "55% of my body" (12/20/2016)   Rheumatoid arthritis (HCC)    Sleep apnea    wears cpap    Tobacco History: Social History   Tobacco Use  Smoking Status Never   Passive exposure: Never  Smokeless Tobacco Never   Counseling given: Not Answered   Outpatient Medications Prior to Visit  Medication Sig Dispense Refill   amLODipine (NORVASC) 2.5 MG tablet Take 1 tablet (2.5 mg total) by mouth daily. In addition to 5 mg amlodipine ( total 7.5 mg per day) 90 tablet 1   certolizumab pegol (CIMZIA) 2 X 200 MG KIT Inject 400 mg into the skin every 28 (twenty-eight) days.     cetirizine (ZYRTEC) 10 MG tablet Take 10 mg by mouth daily as needed for allergies.     clindamycin-benzoyl peroxide (BENZACLIN) gel Apply 1 Application topically as needed.     ipratropium (ATROVENT) 0.03 % nasal spray Place 2 sprays into both nostrils 2 (two) times daily. 30 mL 0   Levonorgestrel (KYLEENA)  19.5 MG IUD 19.5 mg by Intrauterine route once.     methocarbamol (ROBAXIN) 500 MG tablet Take 1 tablet (500 mg total) by mouth every 8 (eight) hours as needed for muscle spasms. 20 tablet 0   NON FORMULARY Pt uses cpap nightly     nystatin-triamcinolone ointment (MYCOLOG) Apply 1 Application topically as needed.     prednisoLONE acetate (PRED FORTE) 1 % ophthalmic suspension Place 1 drop into the left eye 2 (two) times daily. As needed     SUMAtriptan (TOSYMRA) 10 MG/ACT SOLN Place 10 mg into the nose once as needed for up to 1 dose. May repeat after 1 hour.  Maximum 2 sprays in 24 hours. 6 each 11   ramelteon (ROZEREM) 8 MG tablet TAKE 1 TABLET BY MOUTH AT BEDTIME.  (Patient not taking: Reported on 04/16/2023) 90 tablet 1   Facility-Administered Medications Prior to Visit  Medication Dose Route Frequency Provider Last Rate Last Admin   0.9 %  sodium chloride infusion  500 mL Intravenous Continuous Tressia Danas, MD         Review of Systems:   Constitutional:   No  weight loss, night sweats,  Fevers, chills, fatigue, or  lassitude.  HEENT:   No headaches,  Difficulty swallowing,  Tooth/dental problems, or  Sore throat,                No sneezing, itching, ear ache, nasal congestion, post nasal drip,   CV:  No chest pain,  Orthopnea, PND, swelling in lower extremities, anasarca, dizziness, palpitations, syncope.   GI  No heartburn, indigestion, abdominal pain, nausea, vomiting, diarrhea, change in bowel habits, loss of appetite, bloody stools.   Resp: No shortness of breath with exertion or at rest.  No excess mucus, no productive cough,  No non-productive cough,  No coughing up of blood.  No change in color of mucus.  No wheezing.  No chest wall deformity  Skin: no rash or lesions.  GU: no dysuria, change in color of urine, no urgency or frequency.  No flank pain, no hematuria   MS:  No joint pain or swelling.  No decreased range of motion.  No back pain.    Physical Exam  BP 126/70 (BP Location: Left Arm, Patient Position: Sitting, Cuff Size: Large)   Pulse 84   Ht 5\' 4"  (1.626 m)   Wt 279 lb 6.4 oz (126.7 kg)   SpO2 98%   BMI 47.96 kg/m   GEN: A/Ox3; pleasant , NAD, well nourished    HEENT:  [place]/AT,  EACs-clear, TMs-wnl, NOSE-clear, THROAT-clear, no lesions, no postnasal drip or exudate noted.   NECK:  Supple w/ fair ROM; no JVD; normal carotid impulses w/o bruits; no thyromegaly or nodules palpated; no lymphadenopathy.    RESP  Clear  P & A; w/o, wheezes/ rales/ or rhonchi. no accessory muscle use, no dullness to percussion  CARD:  RRR, no m/r/g, no peripheral edema, pulses intact, no cyanosis or clubbing.  GI:   Soft & nt;  nml bowel sounds; no organomegaly or masses detected.   Musco: Warm bil, no deformities or joint swelling noted.   Neuro: alert, no focal deficits noted.    Skin: Warm, no lesions or rashes    Lab Results:  CBC    Component Value Date/Time   WBC 4.9 04/16/2023 1527   RBC 4.19 04/16/2023 1527   HGB 12.7 04/16/2023 1527   HGB 11.9 06/11/2018 1212   HCT 38.7 04/16/2023 1527  HCT 36.6 06/11/2018 1212   PLT 269 04/16/2023 1527   PLT 310 06/11/2018 1212   MCV 92.4 04/16/2023 1527   MCV 93 06/11/2018 1212   MCH 30.3 04/16/2023 1527   MCHC 32.8 04/16/2023 1527   RDW 12.7 04/16/2023 1527   RDW 14.4 06/11/2018 1212   LYMPHSABS 3,283 04/16/2023 1527   LYMPHSABS 2.3 06/11/2018 1212   MONOABS 0.5 02/28/2021 1614   EOSABS 98 04/16/2023 1527   EOSABS 0.2 06/11/2018 1212   BASOSABS 29 04/16/2023 1527   BASOSABS 0.0 06/11/2018 1212    BMET    Component Value Date/Time   NA 138 04/16/2023 1527   NA 138 06/11/2018 1212   K 4.6 04/16/2023 1527   CL 103 04/16/2023 1527   CO2 24 04/16/2023 1527   GLUCOSE 85 04/16/2023 1527   BUN 16 04/16/2023 1527   BUN 9 06/11/2018 1212   CREATININE 0.77 04/16/2023 1527   CALCIUM 9.4 04/16/2023 1527   GFRNONAA >60 03/30/2021 0310   GFRNONAA 72 07/02/2020 1106   GFRAA 84 07/02/2020 1106    BNP No results found for: "BNP"  ProBNP No results found for: "PROBNP"  Imaging: No results found.  certolizumab pegol (CIMZIA) kit 400 mg     Date Action Dose Route User   05/17/2023 1547 Given 400 mg Subcutaneous (Left Anterior Thigh) Henriette Combs, LPN      certolizumab pegol (CIMZIA) kit 400 mg     Date Action Dose Route User   06/21/2023 1442 Given 400 mg Subcutaneous (Left Anterior Thigh) Henriette Combs, LPN           No data to display          No results found for: "NITRICOXIDE"      Assessment & Plan:   OSA (obstructive sleep apnea) Excellent control and compliance on CPAP.  Continue on current settings.  Plan   Patient Instructions  Wear CPAP At bedtime all night long .  Keep up good work.  Work on healthy weight loss  Do not drive if sleepy .  Flu shot .  Follow up in 1 year and As needed        Rubye Oaks, NP 07/04/2023

## 2023-07-04 NOTE — Assessment & Plan Note (Signed)
Excellent control and compliance on CPAP.  Continue on current settings.  Plan  Patient Instructions  Wear CPAP At bedtime all night long .  Keep up good work.  Work on healthy weight loss  Do not drive if sleepy .  Flu shot .  Follow up in 1 year and As needed

## 2023-07-04 NOTE — Addendum Note (Signed)
Addended by: Delrae Rend on: 07/04/2023 11:48 AM   Modules accepted: Orders

## 2023-07-04 NOTE — Addendum Note (Signed)
Addended by: Delrae Rend on: 07/04/2023 12:04 PM   Modules accepted: Orders

## 2023-07-19 ENCOUNTER — Ambulatory Visit: Payer: Managed Care, Other (non HMO) | Attending: Rheumatology | Admitting: *Deleted

## 2023-07-19 VITALS — BP 157/95 | HR 72

## 2023-07-19 DIAGNOSIS — Z111 Encounter for screening for respiratory tuberculosis: Secondary | ICD-10-CM

## 2023-07-19 DIAGNOSIS — M0579 Rheumatoid arthritis with rheumatoid factor of multiple sites without organ or systems involvement: Secondary | ICD-10-CM | POA: Diagnosis not present

## 2023-07-19 DIAGNOSIS — Z79899 Other long term (current) drug therapy: Secondary | ICD-10-CM

## 2023-07-19 MED ORDER — CERTOLIZUMAB PEGOL 2 X 200 MG ~~LOC~~ KIT
400.0000 mg | PACK | Freq: Once | SUBCUTANEOUS | Status: AC
Start: 2023-07-19 — End: 2023-07-19
  Administered 2023-07-19: 400 mg via SUBCUTANEOUS

## 2023-07-19 NOTE — Progress Notes (Signed)
Subjective:   Patient presents to clinic today to receive monthly dose of Cimzia.  Patient running a fever or have signs/symptoms of infection? No  Patient currently on antibiotics for the treatment of infection? No  Patient have any upcoming invasive procedures/surgeries? No  Objective: CMP     Component Value Date/Time   NA 138 04/16/2023 1527   NA 138 06/11/2018 1212   K 4.6 04/16/2023 1527   CL 103 04/16/2023 1527   CO2 24 04/16/2023 1527   GLUCOSE 85 04/16/2023 1527   BUN 16 04/16/2023 1527   BUN 9 06/11/2018 1212   CREATININE 0.77 04/16/2023 1527   CALCIUM 9.4 04/16/2023 1527   PROT 8.1 04/16/2023 1527   PROT 8.0 06/11/2018 1212   ALBUMIN 3.8 03/16/2021 1417   ALBUMIN 3.8 06/11/2018 1212   AST 20 04/16/2023 1527   ALT 14 04/16/2023 1527   ALKPHOS 67 03/16/2021 1417   BILITOT 0.5 04/16/2023 1527   BILITOT 0.3 06/11/2018 1212   GFRNONAA >60 03/30/2021 0310   GFRNONAA 72 07/02/2020 1106   GFRAA 84 07/02/2020 1106    CBC    Component Value Date/Time   WBC 4.9 04/16/2023 1527   RBC 4.19 04/16/2023 1527   HGB 12.7 04/16/2023 1527   HGB 11.9 06/11/2018 1212   HCT 38.7 04/16/2023 1527   HCT 36.6 06/11/2018 1212   PLT 269 04/16/2023 1527   PLT 310 06/11/2018 1212   MCV 92.4 04/16/2023 1527   MCV 93 06/11/2018 1212   MCH 30.3 04/16/2023 1527   MCHC 32.8 04/16/2023 1527   RDW 12.7 04/16/2023 1527   RDW 14.4 06/11/2018 1212   LYMPHSABS 3,283 04/16/2023 1527   LYMPHSABS 2.3 06/11/2018 1212   MONOABS 0.5 02/28/2021 1614   EOSABS 98 04/16/2023 1527   EOSABS 0.2 06/11/2018 1212   BASOSABS 29 04/16/2023 1527   BASOSABS 0.0 06/11/2018 1212    Baseline Immunosuppressant Therapy Labs TB GOLD    Latest Ref Rng & Units 06/06/2022    9:09 AM  Quantiferon TB Gold  Quantiferon TB Gold Plus NEGATIVE NEGATIVE    Hepatitis Panel    Latest Ref Rng & Units 06/13/2019    2:46 PM  Hepatitis  Hep B Surface Ag NON-REACTI NON-REACTIVE   Hep B IgM NON-REACTI NON-REACTIVE    Hep C Ab NON-REACTI NON-REACTIVE   Hep A IgM NON-REACTI NON-REACTIVE    HIV Lab Results  Component Value Date   HIV NON-REACTIVE 02/18/2020   Immunoglobulins    Latest Ref Rng & Units 02/18/2020    9:04 AM  Immunoglobulin Electrophoresis  IgA  47 - 310 mg/dL 147   IgG 829 - 5,621 mg/dL 3,086   IgM 50 - 578 mg/dL 78    SPEP    Latest Ref Rng & Units 04/16/2023    3:27 PM  Serum Protein Electrophoresis  Total Protein 6.1 - 8.1 g/dL 8.1    I6NG No results found for: "G6PDH" TPMT No results found for: "TPMT"   Chest x-ray: 01/27/2022 No acute cardiopulmonary process   Assessment/Plan:   Administrations This Visit     certolizumab pegol (CIMZIA) kit 400 mg     Admin Date 07/19/2023 Action Given Dose 400 mg Route Subcutaneous Documented By Henriette Combs, LPN             Patient tolerated injection well.   Appointment for next injection scheduled for 08/23/2023.  Patient due for labs and were drawn in office today.  Patient is to call and reschedule  appointment if running a fever with signs/symptoms of infection, on antibiotics for active infection or has an upcoming invasive procedure.  All questions encouraged and answered.  Instructed patient to call with any further questions or concerns.

## 2023-07-20 NOTE — Progress Notes (Signed)
CBC and CMP are stable.

## 2023-07-24 LAB — CBC WITH DIFFERENTIAL/PLATELET
Absolute Lymphocytes: 2825 {cells}/uL (ref 850–3900)
Absolute Monocytes: 541 {cells}/uL (ref 200–950)
Basophils Absolute: 41 {cells}/uL (ref 0–200)
Basophils Relative: 0.8 %
Eosinophils Absolute: 112 {cells}/uL (ref 15–500)
Eosinophils Relative: 2.2 %
HCT: 38.3 % (ref 35.0–45.0)
Hemoglobin: 12.5 g/dL (ref 11.7–15.5)
MCH: 30.4 pg (ref 27.0–33.0)
MCHC: 32.6 g/dL (ref 32.0–36.0)
MCV: 93.2 fL (ref 80.0–100.0)
MPV: 10.3 fL (ref 7.5–12.5)
Monocytes Relative: 10.6 %
Neutro Abs: 1581 {cells}/uL (ref 1500–7800)
Neutrophils Relative %: 31 %
Platelets: 304 10*3/uL (ref 140–400)
RBC: 4.11 10*6/uL (ref 3.80–5.10)
RDW: 11.8 % (ref 11.0–15.0)
Total Lymphocyte: 55.4 %
WBC: 5.1 10*3/uL (ref 3.8–10.8)

## 2023-07-24 LAB — QUANTIFERON-TB GOLD PLUS
Mitogen-NIL: 6.87 [IU]/mL
NIL: 0.04 [IU]/mL
QuantiFERON-TB Gold Plus: NEGATIVE
TB1-NIL: 0 [IU]/mL
TB2-NIL: <0 [IU]/mL

## 2023-07-24 LAB — COMPLETE METABOLIC PANEL WITH GFR
AG Ratio: 1 (calc) (ref 1.0–2.5)
ALT: 12 U/L (ref 6–29)
AST: 16 U/L (ref 10–35)
Albumin: 4.1 g/dL (ref 3.6–5.1)
Alkaline phosphatase (APISO): 84 U/L (ref 31–125)
BUN: 14 mg/dL (ref 7–25)
CO2: 26 mmol/L (ref 20–32)
Calcium: 9.4 mg/dL (ref 8.6–10.2)
Chloride: 102 mmol/L (ref 98–110)
Creat: 0.92 mg/dL (ref 0.50–0.99)
Globulin: 4.2 g/dL — ABNORMAL HIGH (ref 1.9–3.7)
Glucose, Bld: 76 mg/dL (ref 65–99)
Potassium: 4 mmol/L (ref 3.5–5.3)
Sodium: 138 mmol/L (ref 135–146)
Total Bilirubin: 0.4 mg/dL (ref 0.2–1.2)
Total Protein: 8.3 g/dL — ABNORMAL HIGH (ref 6.1–8.1)
eGFR: 77 mL/min/{1.73_m2} (ref >=60)

## 2023-07-25 NOTE — Progress Notes (Signed)
TB gold negative

## 2023-08-20 NOTE — Progress Notes (Unsigned)
Office Visit Note  Patient: Jennifer [name]             Date of Birth: 1975-11-08           MRN: [id number]             PCP: Madelin Headings, MD Referring: Madelin Headings, MD Visit Date: 09/03/2023 Occupation: @GUAROCC @  Subjective:    History of Present Illness: Jennifer [name] is a 47 y.o. female with history of seropositive rheumatoid arthritis, iritis, and  osteoarthritis.  Patient remains on Cimzia 400 mg sq injections in the office every 28 days.   CBC and CMP updated on 07/19/23.Her next lab work will be due in January and every 3 months.    TB gold negative on 07/19/23.   Discussed the importance of holding cimzia if she develops signs or symptoms of an infection and to resume once the infection has completley cleared.    Activities of Daily Living:  Patient reports morning stiffness for *** {minute/hour:19697}.   Patient {ACTIONS;DENIES/REPORTS:21021675::"Denies"} nocturnal pain.  Difficulty dressing/grooming: {ACTIONS;DENIES/REPORTS:21021675::"Denies"} Difficulty climbing stairs: {ACTIONS;DENIES/REPORTS:21021675::"Denies"} Difficulty getting out of chair: {ACTIONS;DENIES/REPORTS:21021675::"Denies"} Difficulty using hands for taps, buttons, cutlery, and/or writing: {ACTIONS;DENIES/REPORTS:21021675::"Denies"}  No Rheumatology ROS completed.   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   Primary osteoarthritis of both hands 02/04/2018   Sleep apnea, obstructive 09/28/2017   Primary osteoarthritis of both knees 05/22/2017   Chronic migraine  without aura without status migrainosus, not intractable 01/18/2017   Incisional hernia 12/18/2016   Leiomyoma of uterus 09/06/2016   Anxiety disorder 03/29/2016   Depressive disorder 03/29/2016   Fibroid, uterine    Essential hypertension 05/25/2015   Recurrent headache 05/25/2015   Head lump 07/31/2014   Edema 12/29/2013   Baker's cyst, ruptured 12/25/2013   Cough, persistent 12/12/2013   Left leg swelling 12/12/2013   Cough 12/12/2013   High risk medication use 12/12/2013   Recurrent periodic urticaria    Acne vulgaris 11/21/2012   Postinflammatory hyperpigmentation 11/21/2012   Rheumatoid arthritis (HCC) 08/28/2012   CHRONIC LARYNGITIS 11/03/2010   Allergic rhinitis 11/03/2010   PARESTHESIA 07/28/2010   DYSMENORRHEA 10/15/2007   FIBROIDS, UTERUS 07/24/2007   OBESITY 07/24/2007   SLEEPLESSNESS 07/24/2007   HEADACHE 07/24/2007    Past Medical History:  Diagnosis Date   Acute otitis media 08/28/2012   improved  change to liquid medication    Anxiety disorder 03/29/2016   Breast abscess of female    Recurrent   Depressive disorder 03/29/2016   Family history of adverse reaction to anesthesia    father hard time waking once (12/20/2016)   Fibroids    w/bleeding   GERD (gastroesophageal reflux disease)    History of blood transfusion    after surgery   Hypertension    Migraines    Dr. Santiago Glad; "I have a few/month" (12/20/2016)   Osteoarthritis    Pill esophagitis 08/28/2012   amoxicillin  by hx  disc plan nl voice except hoarse not drooling  close fu  if not getting better with plan stop aleve  liquid ibu onlyf necessary    Recurrent periodic urticaria    Rheumatoid arthritis (HCC)    "55% of my body" (12/20/2016)   Rheumatoid arthritis (HCC)    Sleep apnea    wears cpap    Family History  Problem Relation Age of Onset   Hypertension Mother    Rheum arthritis Mother    Hypertension Father    Sleep apnea Father    Breast cancer Neg Hx    Colon cancer Neg  Hx    Colon polyps Neg Hx    Esophageal cancer Neg Hx    Rectal cancer Neg Hx    Stomach cancer Neg Hx    Past Surgical History:  Procedure Laterality Date   BREAST SURGERY     abcess under left breast    CYST EXCISION Left 2014   "elbow"   HERNIA REPAIR     INCISION AND DRAINAGE BREAST ABSCESS Left 2003   INCISIONAL HERNIA REPAIR N/A 12/18/2016   Procedure: REPAIR INCISIONAL HERNIA WITH MESH;  Surgeon: Violeta Gelinas, MD;  Location: [facility];  Service: General;  Laterality: N/A;   INSERTION OF MESH N/A 12/18/2016   Procedure: INSERTION OF MESH;  Surgeon: Violeta Gelinas, MD;  Location: [facility] OR;  Service: General;  Laterality: N/A;   KNEE ARTHROSCOPY WITH MENISCAL REPAIR Right 06/17/2018   Procedure: RIGHT KNEE ARTHROSCOPY WITH MENISCAL REPAIR;  Surgeon: Dannielle Huh, MD;  Location: [facility];  Service: Orthopedics;  Laterality: Right;   MYOMECTOMY N/A 09/06/2016   Procedure: Conan Bowens;  Surgeon: Fermin Schwab, MD;  Location: [facility];  Service: Gynecology;  Laterality: N/A;   ROOT CANAL  05/2022   TOTAL KNEE ARTHROPLASTY Right 03/28/2021   Procedure: TOTAL KNEE ARTHROPLASTY;  Surgeon: Ollen Gross, MD;  Location: [facility];  Service: Orthopedics;  Laterality: Right;    UTERINE FIBROID SURGERY     34 fibroids   Social History   Social History Narrative   Not on file   Immunization History  Administered Date(s) Administered   Influenza Split 08/21/2012   Influenza,inj,Quad PF,6+ Mos 07/31/2014, 08/19/2019   Pneumococcal Conjugate-13 08/28/2012   Unspecified SARS-COV-2 Vaccination 01/09/2020, 01/30/2020, 09/24/2020     Objective: Vital Signs: There were no vitals taken for this visit.   Physical Exam Vitals and nursing note reviewed.  Constitutional:      Appearance: She is well-developed.  HENT:     Head: Normocephalic and atraumatic.  Eyes:     Conjunctiva/sclera: Conjunctivae normal.  Cardiovascular:     Rate and Rhythm: Normal rate and regular rhythm.      Heart sounds: Normal heart sounds.  Pulmonary:     Effort: Pulmonary effort is normal.     Breath sounds: Normal breath sounds.  Abdominal:     General: Bowel sounds are normal.     Palpations: Abdomen is soft.  Musculoskeletal:     Cervical back: Normal range of motion.  Lymphadenopathy:     Cervical: No cervical adenopathy.  Skin:    General: Skin is warm and dry.     Capillary Refill: Capillary refill takes less than 2 seconds.  Neurological:     Mental Status: She is alert and oriented to person, place, and time.  Psychiatric:        Behavior: Behavior normal.      Musculoskeletal Exam: ***  CDAI Exam: CDAI Score: -- Patient Global: --; Provider Global: -- Swollen: --; Tender: -- Joint Exam 09/03/2023   No joint exam has been documented for  this visit   There is currently no information documented on the homunculus. Go to the Rheumatology activity and complete the homunculus joint exam.  Investigation: No additional findings.  Imaging: No results found.  Recent Labs: Lab Results  Component Value Date   WBC 5.1 07/19/2023   HGB 12.5 07/19/2023   PLT 304 07/19/2023   NA 138 07/19/2023   K 4.0 07/19/2023   CL 102 07/19/2023   CO2 26 07/19/2023   GLUCOSE 76 07/19/2023   BUN 14 07/19/2023   CREATININE 0.92 07/19/2023   BILITOT 0.4 07/19/2023   ALKPHOS 67 03/16/2021   AST 16 07/19/2023   ALT 12 07/19/2023   PROT 8.3 (H) 07/19/2023   ALBUMIN 3.8 03/16/2021   CALCIUM 9.4 07/19/2023   GFRAA 84 07/02/2020   QFTBGOLDPLUS NEGATIVE 07/19/2023    Speciality Comments: Prior therapy: Plaquenil (inadequate response)    Patient is needs to be seen in the office every 3 months to get Cimzia injections.  Procedures:  No procedures performed Allergies: Lisinopril and Lisinopril-hydrochlorothiazide   Assessment / Plan:     Visit Diagnoses: Rheumatoid arthritis involving multiple sites with positive rheumatoid factor (HCC)  High risk medication  use  Iritis  Primary osteoarthritis of both hands  Pain in right wrist  Primary osteoarthritis of left knee  S/P total knee arthroplasty, right  Essential hypertension  Orders: No orders of the defined types were placed in this encounter.  No orders of the defined types were placed in this encounter.   Face-to-face time spent with patient was *** minutes. Greater than 50% of time was spent in counseling and coordination of care.  Follow-Up Instructions: No follow-ups on file.   Gearldine Bienenstock, PA-C  Note - This record has been created using Dragon software.  Chart creation errors have been sought, but may not always  have been located. Such creation errors do not reflect on  the standard of medical care.

## 2023-08-23 ENCOUNTER — Ambulatory Visit: Payer: Managed Care, Other (non HMO)

## 2023-08-24 ENCOUNTER — Telehealth: Payer: Self-pay | Admitting: Pharmacist

## 2023-08-24 NOTE — Telephone Encounter (Signed)
Called patient regarding Cimzia reverification of benefits. She plans to keep [place] commercial plan Anticipates no insurance changes in 2025.  Benefit reverification request submitted on [employer] portal  Chesley Mires, PharmD, MPH, BCPS, CPP Clinical Pharmacist (Rheumatology and Pulmonology)

## 2023-08-29 ENCOUNTER — Ambulatory Visit: Payer: Managed Care, Other (non HMO) | Attending: Rheumatology | Admitting: *Deleted

## 2023-08-29 VITALS — BP 143/88 | HR 85

## 2023-08-29 DIAGNOSIS — M0579 Rheumatoid arthritis with rheumatoid factor of multiple sites without organ or systems involvement: Secondary | ICD-10-CM

## 2023-08-29 MED ORDER — CERTOLIZUMAB PEGOL 2 X 200 MG ~~LOC~~ KIT
400.0000 mg | PACK | Freq: Once | SUBCUTANEOUS | Status: AC
Start: 2023-08-29 — End: 2023-08-29
  Administered 2023-08-29: 400 mg via SUBCUTANEOUS

## 2023-08-29 NOTE — Progress Notes (Signed)
Pharmacy Note  Subjective:   Patient presents to clinic today to receive monthly dose of Cimzia.  Patient running a fever or have signs/symptoms of infection? No  Patient currently on antibiotics for the treatment of infection? No  Patient have any upcoming invasive procedures/surgeries? No  Objective: CMP     Component Value Date/Time   NA 138 07/19/2023 1509   NA 138 06/11/2018 1212   K 4.0 07/19/2023 1509   CL 102 07/19/2023 1509   CO2 26 07/19/2023 1509   GLUCOSE 76 07/19/2023 1509   BUN 14 07/19/2023 1509   BUN 9 06/11/2018 1212   CREATININE 0.92 07/19/2023 1509   CALCIUM 9.4 07/19/2023 1509   PROT 8.3 (H) 07/19/2023 1509   PROT 8.0 06/11/2018 1212   ALBUMIN 3.8 03/16/2021 1417   ALBUMIN 3.8 06/11/2018 1212   AST 16 07/19/2023 1509   ALT 12 07/19/2023 1509   ALKPHOS 67 03/16/2021 1417   BILITOT 0.4 07/19/2023 1509   BILITOT 0.3 06/11/2018 1212   GFRNONAA >60 03/30/2021 0310   GFRNONAA 72 07/02/2020 1106   GFRAA 84 07/02/2020 1106    CBC    Component Value Date/Time   WBC 5.1 07/19/2023 1509   RBC 4.11 07/19/2023 1509   HGB 12.5 07/19/2023 1509   HGB 11.9 06/11/2018 1212   HCT 38.3 07/19/2023 1509   HCT 36.6 06/11/2018 1212   PLT 304 07/19/2023 1509   PLT 310 06/11/2018 1212   MCV 93.2 07/19/2023 1509   MCV 93 06/11/2018 1212   MCH 30.4 07/19/2023 1509   MCHC 32.6 07/19/2023 1509   RDW 11.8 07/19/2023 1509   RDW 14.4 06/11/2018 1212   LYMPHSABS 3,283 04/16/2023 1527   LYMPHSABS 2.3 06/11/2018 1212   MONOABS 0.5 02/28/2021 1614   EOSABS 112 07/19/2023 1509   EOSABS 0.2 06/11/2018 1212   BASOSABS 41 07/19/2023 1509   BASOSABS 0.0 06/11/2018 1212    Baseline Immunosuppressant Therapy Labs TB GOLD    Latest Ref Rng & Units 07/19/2023    3:09 PM  Quantiferon TB Gold  Quantiferon TB Gold Plus NEGATIVE NEGATIVE    Hepatitis Panel    Latest Ref Rng & Units 06/13/2019    2:46 PM  Hepatitis  Hep B Surface Ag NON-REACTI NON-REACTIVE   Hep B IgM  NON-REACTI NON-REACTIVE   Hep C Ab NON-REACTI NON-REACTIVE   Hep A IgM NON-REACTI NON-REACTIVE    HIV Lab Results  Component Value Date   HIV NON-REACTIVE 02/18/2020   Immunoglobulins    Latest Ref Rng & Units 02/18/2020    9:04 AM  Immunoglobulin Electrophoresis  IgA  47 - 310 mg/dL 161   IgG 096 - 0,454 mg/dL 0,981   IgM 50 - 191 mg/dL 78    SPEP    Latest Ref Rng & Units 07/19/2023    3:09 PM  Serum Protein Electrophoresis  Total Protein 6.1 - 8.1 g/dL 8.3    Y7WG No results found for: "G6PDH" TPMT No results found for: "TPMT"   Chest x-ray: 01/27/2022 No acute cardiopulmonary process   Assessment/Plan:   Administrations This Visit     certolizumab pegol (CIMZIA) kit 400 mg     Admin Date 08/29/2023 Action Given Dose 400 mg Route Subcutaneous Documented By Henriette Combs, LPN             Patient tolerated injection well.   Appointment for next injection scheduled for 09/27/2023.  Patient due for labs in January 2025.  Patient is to call and  reschedule appointment if running a fever with signs/symptoms of infection, on antibiotics for active infection or has an upcoming invasive procedure.  All questions encouraged and answered.  Instructed patient to call with any further questions or concerns.

## 2023-09-03 ENCOUNTER — Ambulatory Visit: Payer: Managed Care, Other (non HMO) | Admitting: Physician Assistant

## 2023-09-03 DIAGNOSIS — M25531 Pain in right wrist: Secondary | ICD-10-CM

## 2023-09-03 DIAGNOSIS — I10 Essential (primary) hypertension: Secondary | ICD-10-CM

## 2023-09-03 DIAGNOSIS — M19041 Primary osteoarthritis, right hand: Secondary | ICD-10-CM

## 2023-09-03 DIAGNOSIS — M1712 Unilateral primary osteoarthritis, left knee: Secondary | ICD-10-CM

## 2023-09-03 DIAGNOSIS — H209 Unspecified iridocyclitis: Secondary | ICD-10-CM

## 2023-09-03 DIAGNOSIS — Z96651 Presence of right artificial knee joint: Secondary | ICD-10-CM

## 2023-09-03 DIAGNOSIS — Z79899 Other long term (current) drug therapy: Secondary | ICD-10-CM

## 2023-09-03 DIAGNOSIS — M0579 Rheumatoid arthritis with rheumatoid factor of multiple sites without organ or systems involvement: Secondary | ICD-10-CM

## 2023-09-27 ENCOUNTER — Ambulatory Visit: Payer: Managed Care, Other (non HMO) | Admitting: Adult Health

## 2023-09-27 ENCOUNTER — Ambulatory Visit: Payer: Managed Care, Other (non HMO)

## 2023-10-04 ENCOUNTER — Ambulatory Visit: Payer: Managed Care, Other (non HMO) | Attending: Rheumatology | Admitting: *Deleted

## 2023-10-04 VITALS — BP 157/105 | HR 67

## 2023-10-04 DIAGNOSIS — M0579 Rheumatoid arthritis with rheumatoid factor of multiple sites without organ or systems involvement: Secondary | ICD-10-CM | POA: Diagnosis not present

## 2023-10-04 MED ORDER — CERTOLIZUMAB PEGOL 2 X 200 MG ~~LOC~~ KIT
400.0000 mg | PACK | Freq: Once | SUBCUTANEOUS | Status: AC
Start: 2023-10-04 — End: 2023-10-04
  Administered 2023-10-04: 400 mg via SUBCUTANEOUS

## 2023-10-04 NOTE — Progress Notes (Signed)
Subjective:   Patient presents to clinic today to receive monthly dose of Cimzia.  Patient running a fever or have signs/symptoms of infection? No  Patient currently on antibiotics for the treatment of infection? No  Patient have any upcoming invasive procedures/surgeries? No  Objective: CMP     Component Value Date/Time   NA 138 07/19/2023 1509   NA 138 06/11/2018 1212   K 4.0 07/19/2023 1509   CL 102 07/19/2023 1509   CO2 26 07/19/2023 1509   GLUCOSE 76 07/19/2023 1509   BUN 14 07/19/2023 1509   BUN 9 06/11/2018 1212   CREATININE 0.92 07/19/2023 1509   CALCIUM 9.4 07/19/2023 1509   PROT 8.3 (H) 07/19/2023 1509   PROT 8.0 06/11/2018 1212   ALBUMIN 3.8 03/16/2021 1417   ALBUMIN 3.8 06/11/2018 1212   AST 16 07/19/2023 1509   ALT 12 07/19/2023 1509   ALKPHOS 67 03/16/2021 1417   BILITOT 0.4 07/19/2023 1509   BILITOT 0.3 06/11/2018 1212   GFRNONAA >60 03/30/2021 0310   GFRNONAA 72 07/02/2020 1106   GFRAA 84 07/02/2020 1106    CBC    Component Value Date/Time   WBC 5.1 07/19/2023 1509   RBC 4.11 07/19/2023 1509   HGB 12.5 07/19/2023 1509   HGB 11.9 06/11/2018 1212   HCT 38.3 07/19/2023 1509   HCT 36.6 06/11/2018 1212   PLT 304 07/19/2023 1509   PLT 310 06/11/2018 1212   MCV 93.2 07/19/2023 1509   MCV 93 06/11/2018 1212   MCH 30.4 07/19/2023 1509   MCHC 32.6 07/19/2023 1509   RDW 11.8 07/19/2023 1509   RDW 14.4 06/11/2018 1212   LYMPHSABS 3,283 04/16/2023 1527   LYMPHSABS 2.3 06/11/2018 1212   MONOABS 0.5 02/28/2021 1614   EOSABS 112 07/19/2023 1509   EOSABS 0.2 06/11/2018 1212   BASOSABS 41 07/19/2023 1509   BASOSABS 0.0 06/11/2018 1212    Baseline Immunosuppressant Therapy Labs TB GOLD    Latest Ref Rng & Units 07/19/2023    3:09 PM  Quantiferon TB Gold  Quantiferon TB Gold Plus NEGATIVE NEGATIVE    Hepatitis Panel    Latest Ref Rng & Units 06/13/2019    2:46 PM  Hepatitis  Hep B Surface Ag NON-REACTI NON-REACTIVE   Hep B IgM NON-REACTI  NON-REACTIVE   Hep C Ab NON-REACTI NON-REACTIVE   Hep A IgM NON-REACTI NON-REACTIVE    HIV Lab Results  Component Value Date   HIV NON-REACTIVE 02/18/2020   Immunoglobulins    Latest Ref Rng & Units 02/18/2020    9:04 AM  Immunoglobulin Electrophoresis  IgA  47 - 310 mg/dL 098   IgG 119 - 1,478 mg/dL 2,956   IgM 50 - 213 mg/dL 78    SPEP    Latest Ref Rng & Units 07/19/2023    3:09 PM  Serum Protein Electrophoresis  Total Protein 6.1 - 8.1 g/dL 8.3    Y8MV No results found for: "G6PDH" TPMT No results found for: "TPMT"   Chest x-ray: 01/27/2022 No acute cardiopulmonary process   Assessment/Plan:   Administrations This Visit     certolizumab pegol (CIMZIA) kit 400 mg     Admin Date 10/04/2023 Action Given Dose 400 mg Route Subcutaneous Documented By Henriette Combs, LPN             Patient tolerated injection well.   Appointment for next injection scheduled for 11/01/2023.  Patient due for labs in the end of January 2025. Patient will return in 2 weeks  to update.  Patient is to call and reschedule appointment if running a fever with signs/symptoms of infection, on antibiotics for active infection or has an upcoming invasive procedure.  All questions encouraged and answered.  Instructed patient to call with any further questions or concerns.

## 2023-10-10 NOTE — Telephone Encounter (Signed)
Received in-office Cimzia injection verification of benefits from [employer].  Patient has active [place] plan. CPT codes 40981 and [phone] are valid and billable with 20% coinsurance after $3000 deductible has been met. Patient has $5000 max individual OOP.  W2956 requires pre-certification. [place] is in-network.  Called Rosann Auerbach to renew Cimzia [phone] pre-certification (current one expires on 10/19/2023). Completed clinicals over the phone. Authorization is APPROVED via buy and bill from 10/20/2023 through 10/18/2024. Approval letter will be faxed.  Authorization # [id number] Phone: [phone]  Chesley Mires, PharmD, MPH, BCPS, CPP Clinical Pharmacist (Rheumatology and Pulmonology)

## 2023-10-17 ENCOUNTER — Ambulatory Visit: Payer: Managed Care, Other (non HMO) | Admitting: Internal Medicine

## 2023-10-23 ENCOUNTER — Telehealth: Payer: Self-pay | Admitting: Rheumatology

## 2023-10-23 NOTE — Telephone Encounter (Signed)
 Attempted to contact patient and left message to advise patient to call the office and schedule patient follow up appointment.

## 2023-10-30 ENCOUNTER — Other Ambulatory Visit: Payer: Self-pay | Admitting: *Deleted

## 2023-10-30 DIAGNOSIS — Z79899 Other long term (current) drug therapy: Secondary | ICD-10-CM

## 2023-10-31 LAB — CBC WITH DIFFERENTIAL/PLATELET
Absolute Lymphocytes: 2936 {cells}/uL (ref 850–3900)
Absolute Monocytes: 795 {cells}/uL (ref 200–950)
Basophils Absolute: 32 {cells}/uL (ref 0–200)
Basophils Relative: 0.6 %
Eosinophils Absolute: 80 {cells}/uL (ref 15–500)
Eosinophils Relative: 1.5 %
HCT: 35.9 % (ref 35.0–45.0)
Hemoglobin: 11.6 g/dL — ABNORMAL LOW (ref 11.7–15.5)
MCH: 29.6 pg (ref 27.0–33.0)
MCHC: 32.3 g/dL (ref 32.0–36.0)
MCV: 91.6 fL (ref 80.0–100.0)
MPV: 10.5 fL (ref 7.5–12.5)
Monocytes Relative: 15 %
Neutro Abs: 1458 {cells}/uL — ABNORMAL LOW (ref 1500–7800)
Neutrophils Relative %: 27.5 %
Platelets: 340 10*3/uL (ref 140–400)
RBC: 3.92 10*6/uL (ref 3.80–5.10)
RDW: 12.7 % (ref 11.0–15.0)
Total Lymphocyte: 55.4 %
WBC: 5.3 10*3/uL (ref 3.8–10.8)

## 2023-10-31 LAB — COMPLETE METABOLIC PANEL WITH GFR
AG Ratio: 0.9 (calc) — ABNORMAL LOW (ref 1.0–2.5)
ALT: 21 U/L (ref 6–29)
AST: 18 U/L (ref 10–35)
Albumin: 3.8 g/dL (ref 3.6–5.1)
Alkaline phosphatase (APISO): 78 U/L (ref 31–125)
BUN: 10 mg/dL (ref 7–25)
CO2: 27 mmol/L (ref 20–32)
Calcium: 9 mg/dL (ref 8.6–10.2)
Chloride: 103 mmol/L (ref 98–110)
Creat: 0.86 mg/dL (ref 0.50–0.99)
Globulin: 4.3 g/dL — ABNORMAL HIGH (ref 1.9–3.7)
Glucose, Bld: 99 mg/dL (ref 65–99)
Potassium: 4.3 mmol/L (ref 3.5–5.3)
Sodium: 137 mmol/L (ref 135–146)
Total Bilirubin: 0.4 mg/dL (ref 0.2–1.2)
Total Protein: 8.1 g/dL (ref 6.1–8.1)
eGFR: 83 mL/min/{1.73_m2} (ref >=60)

## 2023-10-31 NOTE — Progress Notes (Signed)
Hemoglobin is low.  CMP is normal.  Please advise patient to take multivitamin with iron.  Please forward results to her PCP.

## 2023-11-01 ENCOUNTER — Ambulatory Visit: Payer: Managed Care, Other (non HMO)

## 2023-11-14 NOTE — Progress Notes (Deleted)
 NEUROLOGY FOLLOW UP OFFICE NOTE  Jennifer [name] [id number]  Assessment/Plan:   Migraine without aura, without status migrainosus, not intractable Hypertension  Migraine prevention:  She will contact her insurance to find out which of following is formulary:  Jorene Guest, Bennie Pierini.  Would not use Aimovig as she already has hypertension Migraine rescue:  Tosymra NS - previously effective.  Already tried rizatriptan and eletriptan.  Generic sumatriptan NS not effective.   Limit use of pain relievers to no more than 2 days out of week to prevent risk of rebound or medication-overuse headache. Keep headache diary Follow up 4 to 5 months.  Subjective:  Jennifer [name] is a 48 year old female with rheumatoid arthritis who follows up for migraines.  UPDATE: Last seen over a year ago.  At that time, she was advised to contact her insurance and find out which CGRP inhibitors were formulary.  ***  ***  Current NSAIDS/analgesics:  *** Current triptans:  Tosymra NS *** Current ergotamine:  none Current anti-emetic:  none Current muscle relaxants:  Robaxin Current anti-anxiolytic:  none Current sleep aide:  Melatonin 10mg  PRN Current Antihypertensive medications:  amlodipine Current Antidepressant medications:  none Current Anticonvulsant medications:  none Current anti-CGRP:  none Current Vitamins/Herbal/Supplements:  Folic acid; melatonin 10mg  PRN Current Antihistamines/Decongestants:  Zyrtec Other therapy:  none Hormone/birth control:  Kyleena Other medications:  methotrexate  Caffeine:  1 to 2 cups of coffee daily.  Sometimes at 4 PM.  No soda or tea Diet:  No soda.  Four 16 oz bottles of water daily.   Depression:  no; Anxiety:  Yes (family-related Other pain:  arthritic pain in her other knee Sleep hygiene:  Poor.  She was diagnosed with OSA and uses a CPAP.  However, she can't turn off her mind.  4 to 6 hours of sleep a night.  Takes melatonin  HISTORY:  She started  having migraines in her late-20s to early-30s.  They are severe mid-frontal/bitemporal/back of neck throbbing pain.  They are associated with seeing white spots when severe, photophobia, phonophobia, rarely nausea..  Her migraines typically last 2 hours to 2 days and occur 2 to 3 times a month.  Triggers include stress and fluorescent lights.     She developed a migraine on 09/18/2019, however it was persistent.  She was subsequently diagnosed with Covid a few days later.  The migraine did not break.  She was prescribed a prednisone taper on 10/08/2019 and the migraine broke.  She currently reports increased family-related stress.        Past NSAIDS/ steroids:  Ibuprofen 800mg ; prednisone Past analgesics:  Tramadol, naproxen Past abortive triptans: Maxalt MLT 10mg ; sumatriptan 20mg  NS, , eletriptan, rizatriptan Past abortive ergotamine:  none Past muscle relaxants:  Flexeril Past anti-emetic:  none Past antihypertensive medications:  Labetolol; lisinopril-HCTZ Past antidepressant medications:  Doxepin 10mg  BID (hives); fluoxetine 10mg  Past anticonvulsant medications:  zonisamide 100mg  daily; topiramate (stopped due to potential memory problems), gabapentin Past anti-CGRP:  none Past vitamins/Herbal/Supplements:  none Past antihistamines/decongestants:  none Other past therapies:  none    Family history of headache:  Aunt (used to have migraines)  PAST MEDICAL HISTORY: Past Medical History:  Diagnosis Date   Acute otitis media 08/28/2012   improved  change to liquid medication    Anxiety disorder 03/29/2016   Breast abscess of female    Recurrent   Depressive disorder 03/29/2016   Family history of adverse reaction to anesthesia    father hard time waking  once (12/20/2016)   Fibroids    w/bleeding   GERD (gastroesophageal reflux disease)    History of blood transfusion    after surgery   Hypertension    Migraines    Dr. Santiago Glad; "I have a few/month" (12/20/2016)    Osteoarthritis    Pill esophagitis 08/28/2012   amoxicillin  by hx  disc plan nl voice except hoarse not drooling  close fu  if not getting better with plan stop aleve  liquid ibu onlyf necessary    Recurrent periodic urticaria    Rheumatoid arthritis (HCC)    "55% of my body" (12/20/2016)   Rheumatoid arthritis (HCC)    Sleep apnea    wears cpap    MEDICATIONS: Current Outpatient Medications on File Prior to Visit  Medication Sig Dispense Refill   amLODipine (NORVASC) 2.5 MG tablet Take 1 tablet (2.5 mg total) by mouth daily. In addition to 5 mg amlodipine ( total 7.5 mg per day) 90 tablet 1   certolizumab pegol (CIMZIA) 2 X 200 MG KIT Inject 400 mg into the skin every 28 (twenty-eight) days.     cetirizine (ZYRTEC) 10 MG tablet Take 10 mg by mouth daily as needed for allergies.     clindamycin-benzoyl peroxide (BENZACLIN) gel Apply 1 Application topically as needed.     ipratropium (ATROVENT) 0.03 % nasal spray Place 2 sprays into both nostrils 2 (two) times daily. 30 mL 0   Levonorgestrel (KYLEENA) 19.5 MG IUD 19.5 mg by Intrauterine route once.     methocarbamol (ROBAXIN) 500 MG tablet Take 1 tablet (500 mg total) by mouth every 8 (eight) hours as needed for muscle spasms. 20 tablet 0   NON FORMULARY Pt uses cpap nightly     nystatin-triamcinolone ointment (MYCOLOG) Apply 1 Application topically as needed.     prednisoLONE acetate (PRED FORTE) 1 % ophthalmic suspension Place 1 drop into the left eye 2 (two) times daily. As needed     SUMAtriptan (TOSYMRA) 10 MG/ACT SOLN Place 10 mg into the nose once as needed for up to 1 dose. May repeat after 1 hour.  Maximum 2 sprays in 24 hours. 6 each 11   [DISCONTINUED] eletriptan (RELPAX) 40 MG tablet Take 1 tablet (40 mg total) by mouth as needed for migraine or headache. May repeat in 2 hours if headache persists or recurs. 10 tablet 1   Current Facility-Administered Medications on File Prior to Visit  Medication Dose Route Frequency Provider  Last Rate Last Admin   0.9 %  sodium chloride infusion  500 mL Intravenous Continuous Tressia Danas, MD          ALLERGIES: Allergies  Allergen Reactions   Lisinopril Anaphylaxis   Lisinopril-Hydrochlorothiazide Swelling and Other (See Comments)    Angioedema  Has tolerated maxide in past so most likely  The ACE inhibitor as the cause     FAMILY HISTORY: Family History  Problem Relation Age of Onset   Hypertension Mother    Rheum arthritis Mother    Hypertension Father    Sleep apnea Father    Breast cancer Neg Hx    Colon cancer Neg Hx    Colon polyps Neg Hx    Esophageal cancer Neg Hx    Rectal cancer Neg Hx    Stomach cancer Neg Hx       Objective:  *** General: No acute distress.  Patient appears well-groomed.   Head:  Normocephalic/atraumatic Neck:  Supple.  No paraspinal tenderness.  Full range of  motion. Heart:  Regular rate and rhythm. Neuro:  Alert and oriented.  Speech fluent and not dysarthric.  Language intact.  CN II-XII intact.  Bulk and tone normal.  Muscle strength 5/5 throughout.  Deep tendon reflexes 2+ throughout.  Gait normal.  Romberg negative.    Shon Millet, DO  CC: Berniece Andreas, MD

## 2023-11-15 ENCOUNTER — Ambulatory Visit: Payer: Managed Care, Other (non HMO) | Admitting: Neurology

## 2023-11-19 NOTE — Progress Notes (Unsigned)
 No chief complaint on file.   HPI: Jennifer [name] 48 y.o. come in for   Last visit with me 10 24  under care for R A, OSA routine gyne ROS: See pertinent positives and negatives per HPI.  Past Medical History:  Diagnosis Date   Acute otitis media 08/28/2012   improved  change to liquid medication    Anxiety disorder 03/29/2016   Breast abscess of female    Recurrent   Depressive disorder 03/29/2016   Family history of adverse reaction to anesthesia    father hard time waking once (12/20/2016)   Fibroids    w/bleeding   GERD (gastroesophageal reflux disease)    History of blood transfusion    after surgery   Hypertension    Migraines    Dr. Santiago Glad; "I have a few/month" (12/20/2016)   Osteoarthritis    Pill esophagitis 08/28/2012   amoxicillin  by hx  disc plan nl voice except hoarse not drooling  close fu  if not getting better with plan stop aleve  liquid ibu onlyf necessary    Recurrent periodic urticaria    Rheumatoid arthritis (HCC)    "55% of my body" (12/20/2016)   Rheumatoid arthritis (HCC)    Sleep apnea    wears cpap    Family History  Problem Relation Age of Onset   Hypertension Mother    Rheum arthritis Mother    Hypertension Father    Sleep apnea Father    Breast cancer Neg Hx    Colon cancer Neg Hx    Colon polyps Neg Hx    Esophageal cancer Neg Hx    Rectal cancer Neg Hx    Stomach cancer Neg Hx     Social History   Socioeconomic History   Marital status: Single    Spouse name: Not on file   Number of children: Not on file   Years of education: Not on file   Highest education level: Some college, no degree  Occupational History   Not on file  Tobacco Use   Smoking status: Never    Passive exposure: Never   Smokeless tobacco: Never  Vaping Use   Vaping status: Never Used  Substance and Sexual Activity   Alcohol use: Yes    Comment: social   Drug use: Never   Sexual activity: Not on file  Other Topics Concern   Not on file   Social History Narrative   Not on file   Social Drivers of Health   Financial Resource Strain: Low Risk  (06/20/2023)   Overall Financial Resource Strain (CARDIA)    Difficulty of Paying Living Expenses: Not hard at all  Food Insecurity: No Food Insecurity (06/20/2023)   Hunger Vital Sign    Worried About Running Out of Food in the Last Year: Never true    Ran Out of Food in the Last Year: Never true  Transportation Needs: No Transportation Needs (06/20/2023)   PRAPARE - Administrator, Civil Service (Medical): No    Lack of Transportation (Non-Medical): No  Physical Activity: Inactive (06/20/2023)   Exercise Vital Sign    Days of Exercise per Week: 0 days    Minutes of Exercise per Session: 0 min  Stress: Stress Concern Present (06/20/2023)   [employer] of Occupational Health - Occupational Stress Questionnaire    Feeling of Stress : Rather much  Social Connections: Moderately Integrated (06/20/2023)   Social Connection and Isolation Panel [NHANES]  Frequency of Communication with Friends and Family: More than three times a week    Frequency of Social Gatherings with Friends and Family: Never    Attends Religious Services: More than 4 times per year    Active Member of Clubs or Organizations: Yes    Attends Engineer, structural: More than 4 times per year    Marital Status: Never married    Outpatient Medications Prior to Visit  Medication Sig Dispense Refill   amLODipine (NORVASC) 2.5 MG tablet Take 1 tablet (2.5 mg total) by mouth daily. In addition to 5 mg amlodipine ( total 7.5 mg per day) 90 tablet 1   certolizumab pegol (CIMZIA) 2 X 200 MG KIT Inject 400 mg into the skin every 28 (twenty-eight) days.     cetirizine (ZYRTEC) 10 MG tablet Take 10 mg by mouth daily as needed for allergies.     clindamycin-benzoyl peroxide (BENZACLIN) gel Apply 1 Application topically as needed.     ipratropium (ATROVENT) 0.03 % nasal spray Place 2 sprays into both  nostrils 2 (two) times daily. 30 mL 0   Levonorgestrel (KYLEENA) 19.5 MG IUD 19.5 mg by Intrauterine route once.     methocarbamol (ROBAXIN) 500 MG tablet Take 1 tablet (500 mg total) by mouth every 8 (eight) hours as needed for muscle spasms. 20 tablet 0   NON FORMULARY Pt uses cpap nightly     nystatin-triamcinolone ointment (MYCOLOG) Apply 1 Application topically as needed.     prednisoLONE acetate (PRED FORTE) 1 % ophthalmic suspension Place 1 drop into the left eye 2 (two) times daily. As needed     SUMAtriptan (TOSYMRA) 10 MG/ACT SOLN Place 10 mg into the nose once as needed for up to 1 dose. May repeat after 1 hour.  Maximum 2 sprays in 24 hours. 6 each 11   Facility-Administered Medications Prior to Visit  Medication Dose Route Frequency Provider Last Rate Last Admin   0.9 %  sodium chloride infusion  500 mL Intravenous Continuous Tressia Danas, MD         EXAM:  There were no vitals taken for this visit.  There is no height or weight on file to calculate BMI.  GENERAL: vitals reviewed and listed above, alert, oriented, appears well hydrated and in no acute distress HEENT: atraumatic, conjunctiva  clear, no obvious abnormalities on inspection of external nose and ears OP : no lesion edema or exudate  NECK: no obvious masses on inspection palpation  LUNGS: clear to auscultation bilaterally, no wheezes, rales or rhonchi, good air movement CV: HRRR, no clubbing cyanosis or  peripheral edema nl cap refill  MS: moves all extremities without noticeable focal  abnormality PSYCH: pleasant and cooperative, no obvious depression or anxiety Lab Results  Component Value Date   WBC 5.3 10/30/2023   HGB 11.6 (L) 10/30/2023   HCT 35.9 10/30/2023   PLT 340 10/30/2023   GLUCOSE 99 10/30/2023   CHOL 173 11/02/2022   TRIG 37 11/02/2022   HDL 80 11/02/2022   LDLCALC 82 11/02/2022   ALT 21 10/30/2023   AST 18 10/30/2023   NA 137 10/30/2023   K 4.3 10/30/2023   CL 103 10/30/2023    CREATININE 0.86 10/30/2023   BUN 10 10/30/2023   CO2 27 10/30/2023   TSH 1.46 07/02/2020   INR 1.1 (H) 02/28/2021   HGBA1C 5.4 06/20/2023   BP Readings from Last 3 Encounters:  10/04/23 (!) 157/105  08/29/23 (!) 143/88  07/19/23 (!) 157/95  ASSESSMENT AND PLAN:  Discussed the following assessment and plan:  No diagnosis found.  -Patient advised to return or notify health care team  if  new concerns arise.  There are no Patient Instructions on file for this visit.   Neta Mends. Ardelle Haliburton M.D.

## 2023-11-20 ENCOUNTER — Ambulatory Visit (INDEPENDENT_AMBULATORY_CARE_PROVIDER_SITE_OTHER): Payer: Managed Care, Other (non HMO) | Admitting: Internal Medicine

## 2023-11-20 ENCOUNTER — Encounter: Payer: Self-pay | Admitting: Internal Medicine

## 2023-11-20 VITALS — BP 120/78 | HR 81 | Temp 98.5°F | Ht 64.0 in | Wt 281.0 lb

## 2023-11-20 DIAGNOSIS — Z8616 Personal history of COVID-19: Secondary | ICD-10-CM

## 2023-11-20 DIAGNOSIS — H9192 Unspecified hearing loss, left ear: Secondary | ICD-10-CM | POA: Diagnosis not present

## 2023-11-20 DIAGNOSIS — J189 Pneumonia, unspecified organism: Secondary | ICD-10-CM

## 2023-11-20 DIAGNOSIS — L729 Follicular cyst of the skin and subcutaneous tissue, unspecified: Secondary | ICD-10-CM

## 2023-11-20 NOTE — Patient Instructions (Addendum)
 Can ffeel tired for a few weeks . Since fever is gone  need to   rest and give time  to recover.  Get copy of x ray report if possible.  Lungs are ok today  Finish medication  Will write communication to dec work  to 4 hours per day until March 10  Plan fu visit in 2-3 weeks or if relapsing concerns.  Expect the hearing to improve in another few weeks.  On treatment and time.

## 2023-12-05 ENCOUNTER — Other Ambulatory Visit: Payer: Self-pay | Admitting: Internal Medicine

## 2023-12-05 ENCOUNTER — Ambulatory Visit: Payer: Self-pay | Admitting: Internal Medicine

## 2023-12-05 ENCOUNTER — Encounter: Payer: Self-pay | Admitting: Internal Medicine

## 2023-12-05 ENCOUNTER — Ambulatory Visit (INDEPENDENT_AMBULATORY_CARE_PROVIDER_SITE_OTHER): Admitting: Internal Medicine

## 2023-12-05 VITALS — BP 130/88 | HR 86 | Temp 98.1°F | Wt 279.8 lb

## 2023-12-05 DIAGNOSIS — Z79899 Other long term (current) drug therapy: Secondary | ICD-10-CM

## 2023-12-05 DIAGNOSIS — I10 Essential (primary) hypertension: Secondary | ICD-10-CM

## 2023-12-05 DIAGNOSIS — H53142 Visual discomfort, left eye: Secondary | ICD-10-CM

## 2023-12-05 DIAGNOSIS — H938X2 Other specified disorders of left ear: Secondary | ICD-10-CM | POA: Diagnosis not present

## 2023-12-05 DIAGNOSIS — R42 Dizziness and giddiness: Secondary | ICD-10-CM | POA: Diagnosis not present

## 2023-12-05 DIAGNOSIS — M0579 Rheumatoid arthritis with rheumatoid factor of multiple sites without organ or systems involvement: Secondary | ICD-10-CM

## 2023-12-05 DIAGNOSIS — J189 Pneumonia, unspecified organism: Secondary | ICD-10-CM

## 2023-12-05 DIAGNOSIS — H9192 Unspecified hearing loss, left ear: Secondary | ICD-10-CM

## 2023-12-05 DIAGNOSIS — J018 Other acute sinusitis: Secondary | ICD-10-CM

## 2023-12-05 MED ORDER — PREDNISONE 20 MG PO TABS: 20.0000 mg | ORAL_TABLET | Freq: Two times a day (BID) | ORAL | 0 refills | Status: DC

## 2023-12-05 MED ORDER — AMLODIPINE BESYLATE 5 MG PO TABS: 7.5000 mg | ORAL_TABLET | Freq: Every day | ORAL | 1 refills | Status: DC

## 2023-12-05 MED ORDER — AMOXICILLIN-POT CLAVULANATE 875-125 MG PO TABS: 1.0000 | ORAL_TABLET | Freq: Two times a day (BID) | ORAL | 0 refills | Status: DC

## 2023-12-05 NOTE — Patient Instructions (Signed)
 This may be sinusitis and migraine  Take antibiotic and 5 days of prednisone.  Will do ent referral in interim  Monitor BP  also  7.5 mg amlodipine per day .

## 2023-12-05 NOTE — Telephone Encounter (Signed)
  Chief Complaint: dizziness, elevated BP  Symptoms:  dizziness upon standing and change in head position. BP last night 158/100 at 8pm . Took amlodipine 2.5 mg rechecked BP 147/94 at 9 pm. This am BP 166/102 . Patient at work now and will recheck BP after taking another dose of medication . Frequency: yesterday  Pertinent Negatives: Patient denies chest pain no difficulty breathing no weakness on either side of body. No blurred vision no slurred speech  Disposition: [] ED /[] Urgent Care (no appt availability in office) / [x] Appointment(In office/virtual)/ []  [facility]/ [] Home Care/ [] Refused Recommended Disposition /[] [facility]/ []  Follow-up with PCP Additional Notes:   Appt scheduled for today . Recommended patient drink extra water today and recheck BP at work if possible since taking extra dose of amlodipine 2.5 mg this am . Recommended if sx worsen call back or go to ED .     Copied from CRM [id number]. Topic: Clinical - Red Word Triage >> Dec 05, 2023  8:56 AM Jennifer [name] wrote: Red Word that prompted transfer to Nurse Triage: Patient experiencing dizziness, states when the sun hits it makes it worse - states its currently happening now and started yesterday. Reason for Disposition  [1] MODERATE dizziness (e.g., interferes with normal activities) AND [2] has NOT been evaluated by doctor (or NP/PA) for this  (Exception: Dizziness caused by heat exposure, sudden standing, or poor fluid intake.)  Answer Assessment - Initial Assessment Questions 1. DESCRIPTION: "Describe your dizziness."     dizziness 2. LIGHTHEADED: "Do you feel lightheaded?" (e.g., somewhat faint, woozy, weak upon standing)     Weak upon standing  3. VERTIGO: "Do you feel like either you or the room is spinning or tilting?" (i.e. vertigo)     na 4. SEVERITY: "How bad is it?"  "Do you feel like you are going to faint?" "Can you stand and walk?"   - MILD: Feels slightly dizzy, but walking normally.    - MODERATE: Feels unsteady when walking, but not falling; interferes with normal activities (e.g., school, work).   - SEVERE: Unable to walk without falling, or requires assistance to walk without falling; feels like passing out now.      Can walk 5. ONSET:  "When did the dizziness begin?"     Yesterday  6. AGGRAVATING FACTORS: "Does anything make it worse?" (e.g., standing, change in head position)     Change in head posotion 7. HEART RATE: "Can you tell me your heart rate?" "How many beats in 15 seconds?"  (Note: not all patients can do this)       na 8. CAUSE: "What do you think is causing the dizziness?"     BP 9. RECURRENT SYMPTOM: "Have you had dizziness before?" If Yes, ask: "When was the last time?" "What happened that time?"     na 10. OTHER SYMPTOMS: "Do you have any other symptoms?" (e.g., fever, chest pain, vomiting, diarrhea, bleeding)       BP elevated yesterday  dizziness  11. PREGNANCY: "Is there any chance you are pregnant?" "When was your last menstrual period?"       na  Protocols used: Dizziness - Lightheadedness-[name]-AH

## 2023-12-05 NOTE — Progress Notes (Signed)
 Chief Complaint  Patient presents with   Hypertension    Pt reports she is taking 2.5mg (whole) at night and half this morning. Pt states someone told her suppose to take 5mg  in addition. Pt reports her BP reading was 158/100 yesterday at 805pm before med and 147/74 at 904pm after med. This am was 166/102 at 742am. Pt declined headache.   Dizziness    Pt reports her sx started yesterday morning. And had blurry vision on Left side.  Pt reports still feel slight dizziness but not as much as yesterday.     HPI: Jennifer [name] 48 y.o. come in for  acute visit today for Dizziness feeling when got   in car yesterday and turned head  and with sun in eye and left side and felt dizzy  and light sensitive on the left eye  ( no change in vision)  .   Was ok    at work not moving a lot but then had to go home  then    ok but when had to get in vehicle   sun glaring  and dizzy Light sensitivity in left side   dizzy not all the time.  Still congested and left ear feel clogged.   Has hx of migraines and take imitrex if needed.  Hearing left ear still feels clogged  but no more pain  left side of face may be more congested than right  no major discharge   .  Cough bgettign better from pna . NO fever and off  RA med for the month   BP was up at home and ok  confusion on med list about amlodipine dose   was taking? 1.5 or less  written for only 2.5 and 5 mg off the list today  ROS: See pertinent positives and negatives per HPI.  Past Medical History:  Diagnosis Date   Acute otitis media 08/28/2012   improved  change to liquid medication    Anxiety disorder 03/29/2016   Breast abscess of female    Recurrent   Depressive disorder 03/29/2016   Family history of adverse reaction to anesthesia    father hard time waking once (12/20/2016)   Fibroids    w/bleeding   GERD (gastroesophageal reflux disease)    History of blood transfusion    after surgery   Hypertension    Migraines    Dr. Santiago Glad;  "I have a few/month" (12/20/2016)   Osteoarthritis    Pill esophagitis 08/28/2012   amoxicillin  by hx  disc plan nl voice except hoarse not drooling  close fu  if not getting better with plan stop aleve  liquid ibu onlyf necessary    Recurrent periodic urticaria    Rheumatoid arthritis (HCC)    "55% of my body" (12/20/2016)   Rheumatoid arthritis (HCC)    Sleep apnea    wears cpap    Family History  Problem Relation Age of Onset   Hypertension Mother    Rheum arthritis Mother    Hypertension Father    Sleep apnea Father    Breast cancer Neg Hx    Colon cancer Neg Hx    Colon polyps Neg Hx    Esophageal cancer Neg Hx    Rectal cancer Neg Hx    Stomach cancer Neg Hx     Social History   Socioeconomic History   Marital status: Single    Spouse name: Not on file   Number of children: Not on  file   Years of education: Not on file   Highest education level: Some college, no degree  Occupational History   Not on file  Tobacco Use   Smoking status: Never    Passive exposure: Never   Smokeless tobacco: Never  Vaping Use   Vaping status: Never Used  Substance and Sexual Activity   Alcohol use: Yes    Comment: social   Drug use: Never   Sexual activity: Not on file  Other Topics Concern   Not on file  Social History Narrative   Not on file   Social Drivers of Health   Financial Resource Strain: Low Risk  (06/20/2023)   Overall Financial Resource Strain (CARDIA)    Difficulty of Paying Living Expenses: Not hard at all  Food Insecurity: No Food Insecurity (06/20/2023)   Hunger Vital Sign    Worried About Running Out of Food in the Last Year: Never true    Ran Out of Food in the Last Year: Never true  Transportation Needs: No Transportation Needs (06/20/2023)   PRAPARE - Administrator, Civil Service (Medical): No    Lack of Transportation (Non-Medical): No  Physical Activity: Inactive (06/20/2023)   Exercise Vital Sign    Days of Exercise per Week: 0 days     Minutes of Exercise per Session: 0 min  Stress: Stress Concern Present (06/20/2023)   [employer] of Occupational Health - Occupational Stress Questionnaire    Feeling of Stress : Rather much  Social Connections: Moderately Integrated (06/20/2023)   Social Connection and Isolation Panel [NHANES]    Frequency of Communication with Friends and Family: More than three times a week    Frequency of Social Gatherings with Friends and Family: Never    Attends Religious Services: More than 4 times per year    Active Member of [employer] or Organizations: Yes    Attends Engineer, structural: More than 4 times per year    Marital Status: Never married    Outpatient Medications Prior to Visit  Medication Sig Dispense Refill   certolizumab pegol (CIMZIA) 2 X 200 MG KIT Inject 400 mg into the skin every 28 (twenty-eight) days.     cetirizine (ZYRTEC) 10 MG tablet Take 10 mg by mouth daily as needed for allergies.     ipratropium (ATROVENT) 0.03 % nasal spray Place 2 sprays into both nostrils 2 (two) times daily. 30 mL 0   levofloxacin (LEVAQUIN) 750 MG tablet Take 750 mg by mouth daily.     Levonorgestrel (KYLEENA) 19.5 MG IUD 19.5 mg by Intrauterine route once.     NON FORMULARY Pt uses cpap nightly     nystatin-triamcinolone ointment (MYCOLOG) Apply 1 Application topically as needed.     prednisoLONE acetate (PRED FORTE) 1 % ophthalmic suspension Place 1 drop into the left eye 2 (two) times daily. As needed     SUMAtriptan (TOSYMRA) 10 MG/ACT SOLN Place 10 mg into the nose once as needed for up to 1 dose. May repeat after 1 hour.  Maximum 2 sprays in 24 hours. 6 each 11   amLODipine (NORVASC) 2.5 MG tablet Take 1 tablet (2.5 mg total) by mouth daily. In addition to 5 mg amlodipine ( total 7.5 mg per day) 90 tablet 1   clindamycin-benzoyl peroxide (BENZACLIN) gel Apply 1 Application topically as needed. (Patient not taking: Reported on 12/05/2023)     methocarbamol (ROBAXIN) 500 MG tablet Take  1 tablet (500 mg total) by mouth every  8 (eight) hours as needed for muscle spasms. (Patient not taking: Reported on 12/05/2023) 20 tablet 0   promethazine-dextromethorphan (PROMETHAZINE-DM) 6.25-15 MG/5ML syrup TAKE 5 MLS BY MOUTH EVERY 4 HOURS AS NEEDED COUGH (Patient not taking: Reported on 12/05/2023)     Facility-Administered Medications Prior to Visit  Medication Dose Route Frequency Provider Last Rate Last Admin   0.9 %  sodium chloride infusion  500 mL Intravenous Continuous Tressia Danas, MD         EXAM:  BP 130/88   Pulse 86   Temp 98.1 F (36.7 C)   Wt 279 lb 12.8 oz (126.9 kg)   SpO2 97%   BMI 48.03 kg/m   Body mass index is 48.03 kg/m.  GENERAL: vitals reviewed and listed above, alert, oriented, appears well hydrated and in no acute distress congested  but non toxic congested  HEENT: atraumatic, conjunctiva  clear, no obvious abnormalities on inspection of external nose and ears  tms intact nl lm empriric dec hearing left  nares 2+ turbinates   stufffy  no facial tenderness  eyes perrl eoms nl but photophobia OP : no lesion edema or exudate  NECK: no obvious masses on inspection palpation  LUNGS: clear to auscultation bilaterally, no wheezes, rales or rhonchi, no coughning  CV: HRRR, no clubbing cyanosis or  peripheral edema nl cap refill  MS: moves all extremities without noticeable focal  abnormality Nenl speech and thought uro non focal neg rhomberg  and nl f to nose    Lab Results  Component Value Date   WBC 5.3 10/30/2023   HGB 11.6 (L) 10/30/2023   HCT 35.9 10/30/2023   PLT 340 10/30/2023   GLUCOSE 99 10/30/2023   CHOL 173 11/02/2022   TRIG 37 11/02/2022   HDL 80 11/02/2022   LDLCALC 82 11/02/2022   ALT 21 10/30/2023   AST 18 10/30/2023   NA 137 10/30/2023   K 4.3 10/30/2023   CL 103 10/30/2023   CREATININE 0.86 10/30/2023   BUN 10 10/30/2023   CO2 27 10/30/2023   TSH 1.46 07/02/2020   INR 1.1 (H) 02/28/2021   HGBA1C 5.4 06/20/2023   BP  Readings from Last 3 Encounters:  12/05/23 130/88  11/20/23 120/78  10/04/23 (!) 157/105    ASSESSMENT AND PLAN:  Discussed the following assessment and plan:  Acute non-recurrent sinusitis of other sinus - Plan: Ambulatory referral to ENT  Dizzy - Plan: Ambulatory referral to ENT  Sensation of plugged ear on left side - Plan: Ambulatory referral to ENT  Essential hypertension  Eyes sensitive to light, left - Plan: Ambulatory referral to ENT  Decreased hearing of left ear - Plan: Ambulatory referral to ENT  Rheumatoid arthritis involving multiple sites with positive rheumatoid factor (HCC)  High risk medication use - Plan: Ambulatory referral to ENT Suspect lingering sinusitis ? not responsive to  rx for pna ..  left ear  sx continue sounds like eustachian tube dysfunction   Has hx of  migraines  poss triggered  migraine ? on left?   BP  up may be confusion about amlodipine  dosing.  Empriric  augmentin and prednisone  and ent referral consult for  sx complex in high risk condition and  recovering pna though   chest exam seems good to day   Eventually will need  fu cxray will place in future orders    Bp control disc   send in amlodipine 5 for 7.5 per day and m onitor  Of  not has been off immuniologic for a month because of  infection on antibiotic .  Om regard to her RA -Patient advised to return or notify health care team  if  new concerns arise.  Patient Instructions  This may be sinusitis and migraine  Take antibiotic and 5 days of prednisone.  Will do ent referral in interim  Monitor BP  also  7.5 mg amlodipine per day .   Neta Mends. Sahvanna Mcmanigal M.D.

## 2023-12-12 ENCOUNTER — Encounter (INDEPENDENT_AMBULATORY_CARE_PROVIDER_SITE_OTHER): Payer: Self-pay

## 2023-12-27 ENCOUNTER — Ambulatory Visit: Attending: Rheumatology | Admitting: *Deleted

## 2023-12-27 VITALS — BP 150/84 | HR 84

## 2023-12-27 DIAGNOSIS — M0579 Rheumatoid arthritis with rheumatoid factor of multiple sites without organ or systems involvement: Secondary | ICD-10-CM

## 2023-12-27 DIAGNOSIS — Z79899 Other long term (current) drug therapy: Secondary | ICD-10-CM | POA: Diagnosis not present

## 2023-12-27 DIAGNOSIS — Z111 Encounter for screening for respiratory tuberculosis: Secondary | ICD-10-CM

## 2023-12-27 DIAGNOSIS — Z9225 Personal history of immunosupression therapy: Secondary | ICD-10-CM

## 2023-12-27 MED ORDER — CERTOLIZUMAB PEGOL 2 X 200 MG ~~LOC~~ KIT
400.0000 mg | PACK | Freq: Once | SUBCUTANEOUS | Status: AC
Start: 2023-12-27 — End: 2023-12-27
  Administered 2023-12-27: 400 mg via SUBCUTANEOUS

## 2023-12-27 NOTE — Progress Notes (Signed)
 Subjective:   Patient presents to clinic today to receive monthly dose of Cimzia.  Patient running a fever or have signs/symptoms of infection? No  Patient currently on antibiotics for the treatment of infection? No  Patient have any upcoming invasive procedures/surgeries? No  Objective: CMP     Component Value Date/Time   NA 137 10/30/2023 0849   NA 138 06/11/2018 1212   K 4.3 10/30/2023 0849   CL 103 10/30/2023 0849   CO2 27 10/30/2023 0849   GLUCOSE 99 10/30/2023 0849   BUN 10 10/30/2023 0849   BUN 9 06/11/2018 1212   CREATININE 0.86 10/30/2023 0849   CALCIUM 9.0 10/30/2023 0849   PROT 8.1 10/30/2023 0849   PROT 8.0 06/11/2018 1212   ALBUMIN 3.8 03/16/2021 1417   ALBUMIN 3.8 06/11/2018 1212   AST 18 10/30/2023 0849   ALT 21 10/30/2023 0849   ALKPHOS 67 03/16/2021 1417   BILITOT 0.4 10/30/2023 0849   BILITOT 0.3 06/11/2018 1212   GFRNONAA >60 03/30/2021 0310   GFRNONAA 72 07/02/2020 1106   GFRAA 84 07/02/2020 1106    CBC    Component Value Date/Time   WBC 5.3 10/30/2023 0849   RBC 3.92 10/30/2023 0849   HGB 11.6 (L) 10/30/2023 0849   HGB 11.9 06/11/2018 1212   HCT 35.9 10/30/2023 0849   HCT 36.6 06/11/2018 1212   PLT 340 10/30/2023 0849   PLT 310 06/11/2018 1212   MCV 91.6 10/30/2023 0849   MCV 93 06/11/2018 1212   MCH 29.6 10/30/2023 0849   MCHC 32.3 10/30/2023 0849   RDW 12.7 10/30/2023 0849   RDW 14.4 06/11/2018 1212   LYMPHSABS 3,283 04/16/2023 1527   LYMPHSABS 2.3 06/11/2018 1212   MONOABS 0.5 02/28/2021 1614   EOSABS 80 10/30/2023 0849   EOSABS 0.2 06/11/2018 1212   BASOSABS 32 10/30/2023 0849   BASOSABS 0.0 06/11/2018 1212    Baseline Immunosuppressant Therapy Labs TB GOLD    Latest Ref Rng & Units 07/19/2023    3:09 PM  Quantiferon TB Gold  Quantiferon TB Gold Plus NEGATIVE NEGATIVE    Hepatitis Panel    Latest Ref Rng & Units 06/13/2019    2:46 PM  Hepatitis  Hep B Surface Ag NON-REACTI NON-REACTIVE   Hep B IgM NON-REACTI  NON-REACTIVE   Hep C Ab NON-REACTI NON-REACTIVE   Hep A IgM NON-REACTI NON-REACTIVE    HIV Lab Results  Component Value Date   HIV NON-REACTIVE 02/18/2020   Immunoglobulins    Latest Ref Rng & Units 02/18/2020    9:04 AM  Immunoglobulin Electrophoresis  IgA  47 - 310 mg/dL 161   IgG 096 - 0,454 mg/dL 0,981   IgM 50 - 191 mg/dL 78    SPEP    Latest Ref Rng & Units 10/30/2023    8:49 AM  Serum Protein Electrophoresis  Total Protein 6.1 - 8.1 g/dL 8.1    Y7WG No results found for: "G6PDH" TPMT No results found for: "TPMT"   Chest x-ray: 01/27/2022 No acute cardiopulmonary process   Assessment/Plan:   Administrations This Visit     certolizumab pegol (CIMZIA) kit 400 mg     Admin Date 12/27/2023 Action Given Dose 400 mg Route Subcutaneous Documented By Henriette Combs, LPN             Patient tolerated injection well.   Appointment for next injection scheduled for 01/24/2024.  Patient due for labs in May 2025.  Patient is to call and reschedule appointment if  running a fever with signs/symptoms of infection, on antibiotics for active infection or has an upcoming invasive procedure.  All questions encouraged and answered.  Instructed patient to call with any further questions or concerns.

## 2023-12-28 ENCOUNTER — Encounter (INDEPENDENT_AMBULATORY_CARE_PROVIDER_SITE_OTHER): Payer: Self-pay | Admitting: Otolaryngology

## 2024-01-24 ENCOUNTER — Ambulatory Visit: Attending: Rheumatology | Admitting: *Deleted

## 2024-01-24 VITALS — BP 146/92 | HR 69

## 2024-01-24 DIAGNOSIS — M0579 Rheumatoid arthritis with rheumatoid factor of multiple sites without organ or systems involvement: Secondary | ICD-10-CM | POA: Diagnosis not present

## 2024-01-24 DIAGNOSIS — Z79899 Other long term (current) drug therapy: Secondary | ICD-10-CM

## 2024-01-24 MED ORDER — CERTOLIZUMAB PEGOL 2 X 200 MG ~~LOC~~ KIT
400.0000 mg | PACK | Freq: Once | SUBCUTANEOUS | Status: AC
Start: 2024-01-24 — End: 2024-01-24
  Administered 2024-01-24: 400 mg via SUBCUTANEOUS

## 2024-01-24 NOTE — Progress Notes (Signed)
 Subjective:   Patient presents to clinic today to receive monthly dose of Cimzia .  Patient running a fever or have signs/symptoms of infection? No  Patient currently on antibiotics for the treatment of infection? No  Patient have any upcoming invasive procedures/surgeries? No  Objective: CMP     Component Value Date/Time   NA 137 10/30/2023 0849   NA 138 06/11/2018 1212   K 4.3 10/30/2023 0849   CL 103 10/30/2023 0849   CO2 27 10/30/2023 0849   GLUCOSE 99 10/30/2023 0849   BUN 10 10/30/2023 0849   BUN 9 06/11/2018 1212   CREATININE 0.86 10/30/2023 0849   CALCIUM 9.0 10/30/2023 0849   PROT 8.1 10/30/2023 0849   PROT 8.0 06/11/2018 1212   ALBUMIN 3.8 03/16/2021 1417   ALBUMIN 3.8 06/11/2018 1212   AST 18 10/30/2023 0849   ALT 21 10/30/2023 0849   ALKPHOS 67 03/16/2021 1417   BILITOT 0.4 10/30/2023 0849   BILITOT 0.3 06/11/2018 1212   GFRNONAA >60 03/30/2021 0310   GFRNONAA 72 07/02/2020 1106   GFRAA 84 07/02/2020 1106    CBC    Component Value Date/Time   WBC 5.3 10/30/2023 0849   RBC 3.92 10/30/2023 0849   HGB 11.6 (L) 10/30/2023 0849   HGB 11.9 06/11/2018 1212   HCT 35.9 10/30/2023 0849   HCT 36.6 06/11/2018 1212   PLT 340 10/30/2023 0849   PLT 310 06/11/2018 1212   MCV 91.6 10/30/2023 0849   MCV 93 06/11/2018 1212   MCH 29.6 10/30/2023 0849   MCHC 32.3 10/30/2023 0849   RDW 12.7 10/30/2023 0849   RDW 14.4 06/11/2018 1212   LYMPHSABS 3,283 04/16/2023 1527   LYMPHSABS 2.3 06/11/2018 1212   MONOABS 0.5 02/28/2021 1614   EOSABS 80 10/30/2023 0849   EOSABS 0.2 06/11/2018 1212   BASOSABS 32 10/30/2023 0849   BASOSABS 0.0 06/11/2018 1212    Baseline Immunosuppressant Therapy Labs TB GOLD    Latest Ref Rng & Units 07/19/2023    3:09 PM  Quantiferon TB Gold  Quantiferon TB Gold Plus NEGATIVE NEGATIVE    Hepatitis Panel    Latest Ref Rng & Units 06/13/2019    2:46 PM  Hepatitis  Hep B Surface Ag NON-REACTI NON-REACTIVE   Hep B IgM NON-REACTI  NON-REACTIVE   Hep C Ab NON-REACTI NON-REACTIVE   Hep A IgM NON-REACTI NON-REACTIVE    HIV Lab Results  Component Value Date   HIV NON-REACTIVE 02/18/2020   Immunoglobulins    Latest Ref Rng & Units 02/18/2020    9:04 AM  Immunoglobulin Electrophoresis  IgA  47 - 310 mg/dL 960   IgG 454 - 0,981 mg/dL 1,914   IgM 50 - 782 mg/dL 78    SPEP    Latest Ref Rng & Units 10/30/2023    8:49 AM  Serum Protein Electrophoresis  Total Protein 6.1 - 8.1 g/dL 8.1    N5AO No results found for: "G6PDH" TPMT No results found for: "TPMT"   Chest x-ray: 01/27/2022 No acute cardiopulmonary process   Assessment/Plan:   Administrations This Visit     certolizumab pegol  (CIMZIA ) kit 400 mg     Admin Date 01/24/2024 Action Given Dose 400 mg Route Subcutaneous Documented By Matilda Somerset, CMA             Patient tolerated injection  well.   Appointment for next injection scheduled for 02/21/2024.  Patient due for labs today and were drawn while in office.  Patient is to  call and reschedule appointment if running a fever with signs/symptoms of infection, on antibiotics for active infection or has an upcoming invasive procedure.  All questions encouraged and answered.  Instructed patient to call with any further questions or concerns.

## 2024-01-25 LAB — COMPREHENSIVE METABOLIC PANEL WITH GFR
AG Ratio: 1 (calc) (ref 1.0–2.5)
ALT: 13 U/L (ref 6–29)
AST: 17 U/L (ref 10–35)
Albumin: 4 g/dL (ref 3.6–5.1)
Alkaline phosphatase (APISO): 74 U/L (ref 31–125)
BUN: 19 mg/dL (ref 7–25)
CO2: 30 mmol/L (ref 20–32)
Calcium: 9.6 mg/dL (ref 8.6–10.2)
Chloride: 105 mmol/L (ref 98–110)
Creat: 0.94 mg/dL (ref 0.50–0.99)
Globulin: 4.2 g/dL — ABNORMAL HIGH (ref 1.9–3.7)
Glucose, Bld: 88 mg/dL (ref 65–99)
Potassium: 5 mmol/L (ref 3.5–5.3)
Sodium: 140 mmol/L (ref 135–146)
Total Bilirubin: 0.4 mg/dL (ref 0.2–1.2)
Total Protein: 8.2 g/dL — ABNORMAL HIGH (ref 6.1–8.1)
eGFR: 75 mL/min/{1.73_m2} (ref >=60)

## 2024-01-25 LAB — CBC WITH DIFFERENTIAL/PLATELET
Absolute Lymphocytes: 2678 {cells}/uL (ref 850–3900)
Absolute Monocytes: 725 {cells}/uL (ref 200–950)
Basophils Absolute: 18 {cells}/uL (ref 0–200)
Basophils Relative: 0.4 %
Eosinophils Absolute: 117 {cells}/uL (ref 15–500)
Eosinophils Relative: 2.6 %
HCT: 37.9 % (ref 35.0–45.0)
Hemoglobin: 12.3 g/dL (ref 11.7–15.5)
MCH: 30.6 pg (ref 27.0–33.0)
MCHC: 32.5 g/dL (ref 32.0–36.0)
MCV: 94.3 fL (ref 80.0–100.0)
MPV: 10.1 fL (ref 7.5–12.5)
Monocytes Relative: 16.1 %
Neutro Abs: 963 {cells}/uL — ABNORMAL LOW (ref 1500–7800)
Neutrophils Relative %: 21.4 %
Platelets: 275 10*3/uL (ref 140–400)
RBC: 4.02 10*6/uL (ref 3.80–5.10)
RDW: 13.1 % (ref 11.0–15.0)
Total Lymphocyte: 59.5 %
WBC: 4.5 10*3/uL (ref 3.8–10.8)

## 2024-01-25 NOTE — Progress Notes (Unsigned)
 Office Visit Note  Patient: Jennifer [name]             Date of Birth: October 30, 1975           MRN: [id number]             PCP: Reginal Capra, MD Referring: Nicholas Bari, MD Visit Date: 01/30/2024 Occupation: @GUAROCC @  Subjective:  Pain in knee joints   History of Present Illness: Jennifer [name] is a 48 y.o. female with seropositive rheumatoid arthritis, iritis and osteoarthritis.  She returns today for her last visit in July 2024.  She states she has been having discomfort in her left knee joint.  Of the right knee joint is replaced.  She states after her right knee joint replacement left knee joint became more painful.  She denies any joint swelling.  She is on Cimzia  injections every month.  She has not had any arthritis flares.     Activities of Daily Living:  Patient reports morning stiffness for 20 minutes.   Patient Reports nocturnal pain.  Difficulty dressing/grooming: Denies Difficulty climbing stairs: Reports Difficulty getting out of chair: Denies Difficulty using hands for taps, buttons, cutlery, and/or writing: Denies  Review of Systems  Constitutional:  Negative for fatigue.  HENT:  Negative for mouth sores and mouth dryness.   Eyes:  Negative for dryness.  Respiratory:  Negative for shortness of breath.   Cardiovascular:  Negative for chest pain and palpitations.  Gastrointestinal:  Negative for blood in stool, constipation and diarrhea.  Endocrine: Negative for increased urination.  Genitourinary:  Negative for involuntary urination.  Musculoskeletal:  Positive for joint pain, joint pain and morning stiffness. Negative for gait problem, joint swelling, myalgias, muscle weakness, muscle tenderness and myalgias.  Skin:  Negative for color change, rash, hair loss and sensitivity to sunlight.  Allergic/Immunologic: Negative for susceptible to infections.  Neurological:  Negative for dizziness and headaches.  Hematological:  Negative for swollen glands.   Psychiatric/Behavioral:  Positive for sleep disturbance. Negative for depressed mood. The patient is nervous/anxious.     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   Primary osteoarthritis of both hands 02/04/2018   Sleep apnea, obstructive 09/28/2017   Primary osteoarthritis of both knees 05/22/2017   Chronic migraine without aura without status migrainosus, not intractable 01/18/2017   Incisional hernia 12/18/2016   Leiomyoma of uterus 09/06/2016   Anxiety disorder 03/29/2016   Depressive disorder 03/29/2016   Fibroid, uterine    Essential hypertension 05/25/2015   Recurrent headache 05/25/2015   Head lump 07/31/2014   Edema 12/29/2013   Baker's cyst, ruptured 12/25/2013   Cough, persistent 12/12/2013   Left leg swelling 12/12/2013   Cough 12/12/2013   High risk medication use 12/12/2013   Recurrent periodic urticaria    Acne vulgaris 11/21/2012   Postinflammatory hyperpigmentation 11/21/2012   Rheumatoid arthritis (HCC) 08/28/2012   CHRONIC LARYNGITIS 11/03/2010   Allergic rhinitis 11/03/2010   PARESTHESIA 07/28/2010   DYSMENORRHEA 10/15/2007   FIBROIDS, UTERUS 07/24/2007   OBESITY 07/24/2007   SLEEPLESSNESS 07/24/2007   HEADACHE  07/24/2007    Past Medical History:  Diagnosis Date   Acute otitis media 08/28/2012   improved  change to liquid medication    Anxiety disorder 03/29/2016   Breast abscess of female    Recurrent   Depressive disorder 03/29/2016   Family history of adverse reaction to anesthesia    father hard time waking once  (12/20/2016)   Fibroids    w/bleeding   GERD (gastroesophageal reflux disease)    History of blood transfusion    after surgery   Hypertension    Migraines    Dr. Reinhold Carbine; "I have a few/month" (12/20/2016)   Osteoarthritis    Pill esophagitis 08/28/2012   amoxicillin   by hx  disc plan nl voice except hoarse not drooling  close fu  if not getting better with plan stop aleve   liquid ibu onlyf necessary    Recurrent periodic urticaria    Rheumatoid arthritis (HCC)    "55% of my body" (12/20/2016)   Rheumatoid arthritis (HCC)    Sleep apnea    wears cpap    Family History  Problem Relation Age of Onset   Hypertension Mother    Rheum arthritis Mother    Hypertension Father    Sleep apnea Father    Breast cancer Neg Hx    Colon cancer Neg Hx    Colon polyps Neg Hx    Esophageal cancer Neg Hx    Rectal cancer Neg Hx    Stomach cancer Neg Hx    Past Surgical History:  Procedure Laterality Date   BREAST SURGERY     abcess under left breast    CYST EXCISION Left 2014   "elbow"   HERNIA REPAIR     INCISION AND DRAINAGE BREAST ABSCESS Left 2003   INCISIONAL HERNIA REPAIR N/A 12/18/2016   Procedure: REPAIR INCISIONAL HERNIA WITH MESH;  Surgeon: Dorena Gander, MD;  Location: [facility];  Service: General;  Laterality: N/A;   INSERTION OF MESH N/A 12/18/2016   Procedure: INSERTION OF MESH;  Surgeon: Dorena Gander, MD;  Location: [facility];  Service: General;  Laterality: N/A;   KNEE ARTHROSCOPY WITH MENISCAL REPAIR Right 06/17/2018   Procedure: RIGHT KNEE ARTHROSCOPY WITH MENISCAL REPAIR;  Surgeon: Christie Cox, MD;  Location: [facility];  Service: Orthopedics;  Laterality: Right;   MYOMECTOMY N/A 09/06/2016   Procedure: Hermine Loots;  Surgeon: Asa Bjork, MD;  Location: [facility];  Service: Gynecology;  Laterality: N/A;   ROOT CANAL  05/2022   TOTAL KNEE ARTHROPLASTY Right 03/28/2021   Procedure: TOTAL KNEE ARTHROPLASTY;  Surgeon: Liliane Rei, MD;  Location: [facility];  Service:  Orthopedics;  Laterality: Right;    UTERINE FIBROID SURGERY     34 fibroids   Social History   Social History Narrative   Not on file   Immunization History  Administered Date(s) Administered   Influenza Split 08/21/2012   Influenza,inj,Quad PF,6+ Mos 07/31/2014, 08/19/2019   Pneumococcal Conjugate-13 08/28/2012   Unspecified SARS-COV-2 Vaccination 01/09/2020, 01/30/2020, 09/24/2020     Objective: Vital Signs: BP 139/88 (BP Location: Left Arm, Patient Position: Sitting, Cuff Size: Large)   Pulse 80   Resp 14   Ht 5\' 4"  (1.626 m)   Wt 282 lb (127.9 kg)   BMI 48.41 kg/m    Physical Exam Vitals and nursing note reviewed.  Constitutional:      Appearance: She is well-developed.  HENT:     Head: Normocephalic and atraumatic.  Eyes:     Conjunctiva/sclera: Conjunctivae  normal.  Cardiovascular:     Rate and Rhythm: Normal rate and regular rhythm.     Heart sounds: Normal heart sounds.  Pulmonary:     Effort: Pulmonary effort is normal.     Breath sounds: Normal breath sounds.  Abdominal:     General: Bowel sounds are normal.     Palpations: Abdomen is soft.  Musculoskeletal:     Cervical back: Normal range of motion.  Lymphadenopathy:     Cervical: No cervical adenopathy.  Skin:    General: Skin is warm and dry.     Capillary Refill: Capillary refill takes less than 2 seconds.  Neurological:     Mental Status: She is alert and oriented to person, place, and time.  Psychiatric:        Behavior: Behavior normal.      Musculoskeletal Exam: Cervical, thoracic and lumbar spine were in good range of motion.  Shoulders, elbow joints, wrist joints, MCPs PIPs and DIPs Juengel range of motion with no synovitis.  Hip joints and knee joints with good range of motion.  Right knee joint was replaced.  There was no warmth swelling or effusion in the left knee joint.  There was no tenderness over ankles or MTPs.  CDAI Exam: CDAI Score: -- Patient Global: --; Provider  Global: -- Swollen: --; Tender: -- Joint Exam 01/30/2024   No joint exam has been documented for this visit   There is currently no information documented on the homunculus. Go to the Rheumatology activity and complete the homunculus joint exam.  Investigation: No additional findings.  Imaging: No results found.  Recent Labs: Lab Results  Component Value Date   WBC 4.5 01/24/2024   HGB 12.3 01/24/2024   PLT 275 01/24/2024   NA 140 01/24/2024   K 5.0 01/24/2024   CL 105 01/24/2024   CO2 30 01/24/2024   GLUCOSE 88 01/24/2024   BUN 19 01/24/2024   CREATININE 0.94 01/24/2024   BILITOT 0.4 01/24/2024   ALKPHOS 67 03/16/2021   AST 17 01/24/2024   ALT 13 01/24/2024   PROT 8.2 (H) 01/24/2024   ALBUMIN 3.8 03/16/2021   CALCIUM 9.6 01/24/2024   GFRAA 84 07/02/2020   QFTBGOLDPLUS NEGATIVE 07/19/2023    Speciality Comments: Prior therapy: Plaquenil (inadequate response)    Patient is needs to be seen in the office every 3 months to get Cimzia  injections.  Procedures:  No procedures performed Allergies: Lisinopril  and Lisinopril -hydrochlorothiazide    Assessment / Plan:     Visit Diagnoses: Rheumatoid arthritis involving multiple sites with positive rheumatoid factor (HCC) - +CCP, +HLA B27: Patient had no synovitis on the examination.  She denies any history of recent flare of rheumatoid arthritis or iritis.  She states she continues to have some discomfort in her left knee joint.  Her right total knee replacement is doing well.  She has been getting Cimzia  injections on a regular basis.  She is on Cimzia  400 mg subcu every 28 days.  Last Cimzia  injection was on Jan 24, 2024.  High risk medication use - Cimzia  400 mg sq injections in the office every 28 days. (Rasuvo  20 mg sq injections, folic acid  1 mg 2 tablets daily.-Patient d/c due to hair loss). -May 2025 CBC and CMP were normal.  TB Gold was negative on July 19, 2023.  Patient was advised to get labs every 3 months.   Information immunization was placed in the AVS.  She was advised to hold Cimzia  if she develops an infection  and resume after the infection resolves.  Annual skin examination to screen for skin cancer was advised.  Use of sunscreen and sun protection was advised.  Plan: QuantiFERON-TB Gold Plus in October.  Will also check lipid panel in October.  Iritis-no flares since she has been on Cimzia .  Primary osteoarthritis of both hands-no iritis was noted on the examination today.  Pain in right wrist-resolved.  Primary osteoarthritis of left knee - s/p euflexxa left knee March 2024.  She has been doing well except for some discomfort in her left knee joint.  No warmth swelling or effusion was noted.  Weight loss diet and exercise was discussed.  Patient plans to join the gym and exercise on a regular basis.  S/P total knee arthroplasty, right - 03/28/21.  Doing well.  Essential hypertension-blood pressure was 139/ 88 today.  Dyslipidemia -will check lipid panel with her next labs.  Plan: Lipid panel.  Increased risk of heart disease with rheumatoid arthritis was discussed.  BMI 48.41-weight loss diet and exercise was emphasized.  Patient plans to join the gym.  Dietary modifications were discussed.  Orders: Orders Placed This Encounter  Procedures   Lipid panel   QuantiFERON-TB Gold Plus   No orders of the defined types were placed in this encounter.    Follow-Up Instructions: Return in about 5 months (around 07/01/2024) for Rheumatoid arthritis.   Nicholas Bari, MD  Note - This record has been created using Animal nutritionist.  Chart creation errors have been sought, but may not always  have been located. Such creation errors do not reflect on  the standard of medical care.

## 2024-01-25 NOTE — Progress Notes (Signed)
 CBC and CMP are stable.

## 2024-01-30 ENCOUNTER — Encounter: Payer: Self-pay | Admitting: Rheumatology

## 2024-01-30 ENCOUNTER — Ambulatory Visit: Attending: Rheumatology | Admitting: Rheumatology

## 2024-01-30 VITALS — BP 139/88 | HR 80 | Resp 14 | Ht 64.0 in | Wt 282.0 lb

## 2024-01-30 DIAGNOSIS — Z79899 Other long term (current) drug therapy: Secondary | ICD-10-CM

## 2024-01-30 DIAGNOSIS — M0579 Rheumatoid arthritis with rheumatoid factor of multiple sites without organ or systems involvement: Secondary | ICD-10-CM

## 2024-01-30 DIAGNOSIS — M19042 Primary osteoarthritis, left hand: Secondary | ICD-10-CM

## 2024-01-30 DIAGNOSIS — M19041 Primary osteoarthritis, right hand: Secondary | ICD-10-CM

## 2024-01-30 DIAGNOSIS — H209 Unspecified iridocyclitis: Secondary | ICD-10-CM

## 2024-01-30 DIAGNOSIS — E785 Hyperlipidemia, unspecified: Secondary | ICD-10-CM

## 2024-01-30 DIAGNOSIS — M25531 Pain in right wrist: Secondary | ICD-10-CM

## 2024-01-30 DIAGNOSIS — Z96651 Presence of right artificial knee joint: Secondary | ICD-10-CM

## 2024-01-30 DIAGNOSIS — I10 Essential (primary) hypertension: Secondary | ICD-10-CM

## 2024-01-30 DIAGNOSIS — M1712 Unilateral primary osteoarthritis, left knee: Secondary | ICD-10-CM

## 2024-01-30 DIAGNOSIS — Z6841 Body Mass Index (BMI) 40.0 and over, adult: Secondary | ICD-10-CM

## 2024-01-30 NOTE — Patient Instructions (Signed)
 Standing Labs We placed an order today for your standing lab work.   Please have your standing labs drawn in August and every 3 months  Please have your labs drawn 2 weeks prior to your appointment so that the provider can discuss your lab results at your appointment, if possible.  Please note that you may see your imaging and lab results in MyChart before we have reviewed them. We will contact you once all results are reviewed. Please allow our office up to 72 hours to thoroughly review all of the results before contacting the office for clarification of your results.  WALK-IN LAB HOURS  Monday through Thursday from 8:00 am -12:30 pm and 1:00 pm-4:00 pm and Friday from 8:00 am-12:00 pm.  Patients with office visits requiring labs will be seen before walk-in labs.  You may encounter longer than normal wait times. Please allow additional time. Wait times may be shorter on  Monday and Thursday afternoons.  We do not book appointments for walk-in labs. We appreciate your patience and understanding with our staff.   Labs are drawn by [facility]. Please bring your co-pay at the time of your lab draw.  You may receive a bill from [facility] for your lab work.  Please note if you are on Hydroxychloroquine and and an order has been placed for a Hydroxychloroquine level,  you will need to have it drawn 4 hours or more after your last dose.  If you wish to have your labs drawn at another location, please call the office 24 hours in advance so we can fax the orders.  The office is located at [address], [place], [place] [address]   If you have any questions regarding directions or hours of operation,  please call [phone].   As a reminder, please drink plenty of water  prior to coming for your lab work. Thanks!   Vaccines You are taking a medication(s) that can suppress your immune system.  The following immunizations are recommended: Flu annually Covid-19  Td/Tdap (tetanus,  diphtheria, pertussis) every 10 years Pneumonia (Prevnar 15 then Pneumovax 23 at least 1 year apart.  Alternatively, can take Prevnar 20 without needing additional dose) Shingrix: 2 doses from 4 weeks to 6 months apart  Please check with your PCP to make sure you are up to date.   If you have signs or symptoms of an infection or start antibiotics: First, call your PCP for workup of your infection. Hold your medication through the infection, until you complete your antibiotics, and until symptoms resolve if you take the following: Injectable medication (Actemra, Benlysta, Cimzia , Cosentyx, Enbrel, Humira, Kevzara, Orencia, Remicade, Simponi, Stelara, Taltz, Tremfya) Methotrexate  Leflunomide (Arava) Mycophenolate (Cellcept) Cloria Danger, Olumiant, or Rinvoq  Please get an annual skin examination to screen for skin cancer while you are on Cimzia .  Please use sunscreen and sun protection.

## 2024-02-21 ENCOUNTER — Ambulatory Visit: Attending: Rheumatology | Admitting: *Deleted

## 2024-02-21 VITALS — BP 129/86 | HR 88

## 2024-02-21 DIAGNOSIS — M0579 Rheumatoid arthritis with rheumatoid factor of multiple sites without organ or systems involvement: Secondary | ICD-10-CM

## 2024-02-21 MED ORDER — CERTOLIZUMAB PEGOL 2 X 200 MG ~~LOC~~ KIT
400.0000 mg | PACK | Freq: Once | SUBCUTANEOUS | Status: AC
Start: 2024-02-21 — End: 2024-02-21
  Administered 2024-02-21: 400 mg via SUBCUTANEOUS

## 2024-02-21 NOTE — Progress Notes (Signed)
 Subjective:   Patient presents to clinic today to receive monthly dose of Cimzia .  Patient running a fever or have signs/symptoms of infection? No  Patient currently on antibiotics for the treatment of infection? No  Patient have any upcoming invasive procedures/surgeries? No  Objective: CMP     Component Value Date/Time   NA 140 01/24/2024 1510   NA 138 06/11/2018 1212   K 5.0 01/24/2024 1510   CL 105 01/24/2024 1510   CO2 30 01/24/2024 1510   GLUCOSE 88 01/24/2024 1510   BUN 19 01/24/2024 1510   BUN 9 06/11/2018 1212   CREATININE 0.94 01/24/2024 1510   CALCIUM 9.6 01/24/2024 1510   PROT 8.2 (H) 01/24/2024 1510   PROT 8.0 06/11/2018 1212   ALBUMIN 3.8 03/16/2021 1417   ALBUMIN 3.8 06/11/2018 1212   AST 17 01/24/2024 1510   ALT 13 01/24/2024 1510   ALKPHOS 67 03/16/2021 1417   BILITOT 0.4 01/24/2024 1510   BILITOT 0.3 06/11/2018 1212   GFRNONAA >60 03/30/2021 0310   GFRNONAA 72 07/02/2020 1106   GFRAA 84 07/02/2020 1106    CBC    Component Value Date/Time   WBC 4.5 01/24/2024 1510   RBC 4.02 01/24/2024 1510   HGB 12.3 01/24/2024 1510   HGB 11.9 06/11/2018 1212   HCT 37.9 01/24/2024 1510   HCT 36.6 06/11/2018 1212   PLT 275 01/24/2024 1510   PLT 310 06/11/2018 1212   MCV 94.3 01/24/2024 1510   MCV 93 06/11/2018 1212   MCH 30.6 01/24/2024 1510   MCHC 32.5 01/24/2024 1510   RDW 13.1 01/24/2024 1510   RDW 14.4 06/11/2018 1212   LYMPHSABS 3,283 04/16/2023 1527   LYMPHSABS 2.3 06/11/2018 1212   MONOABS 0.5 02/28/2021 1614   EOSABS 117 01/24/2024 1510   EOSABS 0.2 06/11/2018 1212   BASOSABS 18 01/24/2024 1510   BASOSABS 0.0 06/11/2018 1212    Baseline Immunosuppressant Therapy Labs TB GOLD    Latest Ref Rng & Units 07/19/2023    3:09 PM  Quantiferon TB Gold  Quantiferon TB Gold Plus NEGATIVE NEGATIVE    Hepatitis Panel    Latest Ref Rng & Units 06/13/2019    2:46 PM  Hepatitis  Hep B Surface Ag NON-REACTI NON-REACTIVE   Hep B IgM NON-REACTI  NON-REACTIVE   Hep C Ab NON-REACTI NON-REACTIVE   Hep A IgM NON-REACTI NON-REACTIVE    HIV Lab Results  Component Value Date   HIV NON-REACTIVE 02/18/2020   Immunoglobulins    Latest Ref Rng & Units 02/18/2020    9:04 AM  Immunoglobulin Electrophoresis  IgA  47 - 310 mg/dL 130   IgG 865 - 7,846 mg/dL 9,629   IgM 50 - 528 mg/dL 78    SPEP    Latest Ref Rng & Units 01/24/2024    3:10 PM  Serum Protein Electrophoresis  Total Protein 6.1 - 8.1 g/dL 8.2    U1LK No results found for: "G6PDH" TPMT No results found for: "TPMT"   Chest x-ray: 01/27/2022 No acute cardiopulmonary process   Assessment/Plan:   Administrations This Visit     certolizumab pegol  (CIMZIA ) kit 400 mg     Admin Date 02/21/2024 Action Given Dose 400 mg Route Subcutaneous Documented By Adrianne Horn, LPN             Patient tolerated injection well.   Appointment for next injection scheduled for 03/20/2024.  Patient due for labs in August 2025.  Patient is to call and reschedule appointment if  running a fever with signs/symptoms of infection, on antibiotics for active infection or has an upcoming invasive procedure.  All questions encouraged and answered.  Instructed patient to call with any further questions or concerns.

## 2024-03-20 ENCOUNTER — Ambulatory Visit: Attending: Rheumatology

## 2024-03-20 VITALS — BP 132/87 | HR 77

## 2024-03-20 DIAGNOSIS — M0579 Rheumatoid arthritis with rheumatoid factor of multiple sites without organ or systems involvement: Secondary | ICD-10-CM | POA: Diagnosis not present

## 2024-03-20 MED ORDER — CERTOLIZUMAB PEGOL 2 X 200 MG ~~LOC~~ KIT
400.0000 mg | PACK | Freq: Once | SUBCUTANEOUS | Status: AC
  Administered 2024-03-20: 400 mg via SUBCUTANEOUS

## 2024-03-20 NOTE — Progress Notes (Signed)
 Pharmacy Note  Subjective:   Patient presents to clinic today to receive monthly dose of Cimzia .  Patient running a fever or have signs/symptoms of infection? No  Patient currently on antibiotics for the treatment of infection? No  Patient have any upcoming invasive procedures/surgeries? No  Objective: CMP     Component Value Date/Time   NA 140 01/24/2024 1510   NA 138 06/11/2018 1212   K 5.0 01/24/2024 1510   CL 105 01/24/2024 1510   CO2 30 01/24/2024 1510   GLUCOSE 88 01/24/2024 1510   BUN 19 01/24/2024 1510   BUN 9 06/11/2018 1212   CREATININE 0.94 01/24/2024 1510   CALCIUM 9.6 01/24/2024 1510   PROT 8.2 (H) 01/24/2024 1510   PROT 8.0 06/11/2018 1212   ALBUMIN 3.8 03/16/2021 1417   ALBUMIN 3.8 06/11/2018 1212   AST 17 01/24/2024 1510   ALT 13 01/24/2024 1510   ALKPHOS 67 03/16/2021 1417   BILITOT 0.4 01/24/2024 1510   BILITOT 0.3 06/11/2018 1212   GFRNONAA >60 03/30/2021 0310   GFRNONAA 72 07/02/2020 1106   GFRAA 84 07/02/2020 1106    CBC    Component Value Date/Time   WBC 4.5 01/24/2024 1510   RBC 4.02 01/24/2024 1510   HGB 12.3 01/24/2024 1510   HGB 11.9 06/11/2018 1212   HCT 37.9 01/24/2024 1510   HCT 36.6 06/11/2018 1212   PLT 275 01/24/2024 1510   PLT 310 06/11/2018 1212   MCV 94.3 01/24/2024 1510   MCV 93 06/11/2018 1212   MCH 30.6 01/24/2024 1510   MCHC 32.5 01/24/2024 1510   RDW 13.1 01/24/2024 1510   RDW 14.4 06/11/2018 1212   LYMPHSABS 3,283 04/16/2023 1527   LYMPHSABS 2.3 06/11/2018 1212   MONOABS 0.5 02/28/2021 1614   EOSABS 117 01/24/2024 1510   EOSABS 0.2 06/11/2018 1212   BASOSABS 18 01/24/2024 1510   BASOSABS 0.0 06/11/2018 1212    Baseline Immunosuppressant Therapy Labs TB GOLD    Latest Ref Rng & Units 07/19/2023    3:09 PM  Quantiferon TB Gold  Quantiferon TB Gold Plus NEGATIVE NEGATIVE    Hepatitis Panel    Latest Ref Rng & Units 06/13/2019    2:46 PM  Hepatitis  Hep B Surface Ag NON-REACTI NON-REACTIVE   Hep B IgM  NON-REACTI NON-REACTIVE   Hep C Ab NON-REACTI NON-REACTIVE   Hep A IgM NON-REACTI NON-REACTIVE    HIV Lab Results  Component Value Date   HIV NON-REACTIVE 02/18/2020   Immunoglobulins    Latest Ref Rng & Units 02/18/2020    9:04 AM  Immunoglobulin Electrophoresis  IgA  47 - 310 mg/dL 348   IgG 399 - 8,359 mg/dL 7,781   IgM 50 - 699 mg/dL 78    SPEP    Latest Ref Rng & Units 01/24/2024    3:10 PM  Serum Protein Electrophoresis  Total Protein 6.1 - 8.1 g/dL 8.2    H3EI No results found for: G6PDH TPMT No results found for: TPMT   Chest x-ray: 01/27/2022 No acute cardiopulmonary process   Assessment/Plan:   Administrations This Visit     certolizumab pegol  (CIMZIA ) kit 400 mg     Admin Date 03/20/2024 Action Given Dose 400 mg Route Subcutaneous Documented By Cena Alfonso CROME, LPN             Patient tolerated injection  well.   Appointment for next injection scheduled for 04/17/2024.  Patient due for labs in August 2025.  Patient is to call  and reschedule appointment if running a fever with signs/symptoms of infection, on antibiotics for active infection or has an upcoming invasive procedure.  All questions encouraged and answered.  Instructed patient to call with any further questions or concerns.

## 2024-04-17 ENCOUNTER — Ambulatory Visit

## 2024-04-24 ENCOUNTER — Ambulatory Visit: Attending: Rheumatology

## 2024-04-24 VITALS — BP 117/75 | HR 76

## 2024-04-24 DIAGNOSIS — M0579 Rheumatoid arthritis with rheumatoid factor of multiple sites without organ or systems involvement: Secondary | ICD-10-CM

## 2024-04-24 MED ORDER — CERTOLIZUMAB PEGOL 2 X 200 MG ~~LOC~~ KIT
400.0000 mg | PACK | Freq: Once | SUBCUTANEOUS | Status: AC
Start: 2024-04-24 — End: 2024-04-24
  Administered 2024-04-24: 400 mg via SUBCUTANEOUS

## 2024-04-24 NOTE — Progress Notes (Signed)
 Pharmacy Note  Subjective:   Patient presents to clinic today to receive monthly dose of Cimzia .  Patient running a fever or have signs/symptoms of infection? No  Patient currently on antibiotics for the treatment of infection? Yes  Patient have any upcoming invasive procedures/surgeries? No  Objective: CMP     Component Value Date/Time   NA 140 01/24/2024 1510   NA 138 06/11/2018 1212   K 5.0 01/24/2024 1510   CL 105 01/24/2024 1510   CO2 30 01/24/2024 1510   GLUCOSE 88 01/24/2024 1510   BUN 19 01/24/2024 1510   BUN 9 06/11/2018 1212   CREATININE 0.94 01/24/2024 1510   CALCIUM 9.6 01/24/2024 1510   PROT 8.2 (H) 01/24/2024 1510   PROT 8.0 06/11/2018 1212   ALBUMIN 3.8 03/16/2021 1417   ALBUMIN 3.8 06/11/2018 1212   AST 17 01/24/2024 1510   ALT 13 01/24/2024 1510   ALKPHOS 67 03/16/2021 1417   BILITOT 0.4 01/24/2024 1510   BILITOT 0.3 06/11/2018 1212   GFRNONAA >60 03/30/2021 0310   GFRNONAA 72 07/02/2020 1106   GFRAA 84 07/02/2020 1106    CBC    Component Value Date/Time   WBC 4.5 01/24/2024 1510   RBC 4.02 01/24/2024 1510   HGB 12.3 01/24/2024 1510   HGB 11.9 06/11/2018 1212   HCT 37.9 01/24/2024 1510   HCT 36.6 06/11/2018 1212   PLT 275 01/24/2024 1510   PLT 310 06/11/2018 1212   MCV 94.3 01/24/2024 1510   MCV 93 06/11/2018 1212   MCH 30.6 01/24/2024 1510   MCHC 32.5 01/24/2024 1510   RDW 13.1 01/24/2024 1510   RDW 14.4 06/11/2018 1212   LYMPHSABS 3,283 04/16/2023 1527   LYMPHSABS 2.3 06/11/2018 1212   MONOABS 0.5 02/28/2021 1614   EOSABS 117 01/24/2024 1510   EOSABS 0.2 06/11/2018 1212   BASOSABS 18 01/24/2024 1510   BASOSABS 0.0 06/11/2018 1212    Baseline Immunosuppressant Therapy Labs TB GOLD    Latest Ref Rng & Units 07/19/2023    3:09 PM  Quantiferon TB Gold  Quantiferon TB Gold Plus NEGATIVE NEGATIVE    Hepatitis Panel    Latest Ref Rng & Units 06/13/2019    2:46 PM  Hepatitis  Hep B Surface Ag NON-REACTI NON-REACTIVE   Hep B IgM  NON-REACTI NON-REACTIVE   Hep C Ab NON-REACTI NON-REACTIVE   Hep A IgM NON-REACTI NON-REACTIVE    HIV Lab Results  Component Value Date   HIV NON-REACTIVE 02/18/2020   Immunoglobulins    Latest Ref Rng & Units 02/18/2020    9:04 AM  Immunoglobulin Electrophoresis  IgA  47 - 310 mg/dL 348   IgG 399 - 8,359 mg/dL 7,781   IgM 50 - 699 mg/dL 78    SPEP    Latest Ref Rng & Units 01/24/2024    3:10 PM  Serum Protein Electrophoresis  Total Protein 6.1 - 8.1 g/dL 8.2    H3EI No results found for: G6PDH TPMT No results found for: TPMT   Chest x-ray: 01/27/2022 No acute cardiopulmonary process   Assessment/Plan:  Administrations This Visit     certolizumab pegol  (CIMZIA ) kit 400 mg     Admin Date 04/24/2024 Action Given Dose 400 mg Route Subcutaneous Documented By Burl Francina HERO, CMA             Patient tolerated injection well.   Appointment for next injection scheduled for 05/22/2024.  Patient due for labs in August 2025.  Patient is to call and reschedule  appointment if running a fever with signs/symptoms of infection, on antibiotics for active infection or has an upcoming invasive procedure.  All questions encouraged and answered.  Instructed patient to call with any further questions or concerns.  '

## 2024-05-07 ENCOUNTER — Ambulatory Visit (INDEPENDENT_AMBULATORY_CARE_PROVIDER_SITE_OTHER): Admitting: Internal Medicine

## 2024-05-07 ENCOUNTER — Encounter: Payer: Self-pay | Admitting: Internal Medicine

## 2024-05-07 VITALS — BP 130/88 | HR 83 | Temp 96.3°F | Ht 64.0 in | Wt 278.0 lb

## 2024-05-07 DIAGNOSIS — M0579 Rheumatoid arthritis with rheumatoid factor of multiple sites without organ or systems involvement: Secondary | ICD-10-CM

## 2024-05-07 DIAGNOSIS — M25472 Effusion, left ankle: Secondary | ICD-10-CM | POA: Diagnosis not present

## 2024-05-07 DIAGNOSIS — I10 Essential (primary) hypertension: Secondary | ICD-10-CM | POA: Diagnosis not present

## 2024-05-07 DIAGNOSIS — L729 Follicular cyst of the skin and subcutaneous tissue, unspecified: Secondary | ICD-10-CM

## 2024-05-07 MED ORDER — AMLODIPINE BESYLATE 5 MG PO TABS: 7.5000 mg | ORAL_TABLET | Freq: Every day | ORAL | 1 refills | Status: DC

## 2024-05-07 NOTE — Progress Notes (Signed)
 Chief Complaint  Patient presents with   Edema    Pt reports swelling off and on for last 6 months. On Left ankle. Pt states her ankle has gotten better but skin is peeling.    Cyst    Pt reports knot on R side of head. Not painful to pain. Bothering her for a year. Wonder if it might increase her migraine.    Medication Refill    HPI: Jennifer [name] 48 y.o. come in for 2 concerns  Scalp lump ? Cyst for a long time  more noteceable since different hiar style no pain no rash  ? Could it be related to HAs   nasal suma  has been helpful but using a bit more  almost 2 x per week.  Dr Skeet  is  specialist.   Left ankle became red hot and swollen lower leg  lasting  over 4 days without obvious injury no serious pain but stiff  and asymmetric . No triggers knows.   Skin became pigmented and then peeling .   RA seems to have been stable   has fu in about 2 weeks.   No pain in leg  has pix of some swelling lower left.   No prev injury other was able to walk used compression stockings.   Needs amlofipine 7.5 equiv refills   bp ok ( doesn't seem related  f leg didn't swell)   ROS: See pertinent positives and negatives per HPI.  Past Medical History:  Diagnosis Date   Acute otitis media 08/28/2012   improved  change to liquid medication    Anxiety disorder 03/29/2016   Breast abscess of female    Recurrent   Depressive disorder 03/29/2016   Family history of adverse reaction to anesthesia    father hard time waking once (12/20/2016)   Fibroids    w/bleeding   GERD (gastroesophageal reflux disease)    History of blood transfusion    after surgery   Hypertension    Migraines    Dr. Layman Meissner; I have a few/month (12/20/2016)   Osteoarthritis    Pill esophagitis 08/28/2012   amoxicillin   by hx  disc plan nl voice except hoarse not drooling  close fu  if not getting better with plan stop aleve   liquid ibu onlyf necessary    Recurrent periodic urticaria    Rheumatoid arthritis  (HCC)    55% of my body (12/20/2016)   Rheumatoid arthritis (HCC)    Sleep apnea    wears cpap    Family History  Problem Relation Age of Onset   Hypertension Mother    Rheum arthritis Mother    Hypertension Father    Sleep apnea Father    Breast cancer Neg Hx    Colon cancer Neg Hx    Colon polyps Neg Hx    Esophageal cancer Neg Hx    Rectal cancer Neg Hx    Stomach cancer Neg Hx     Social History   Socioeconomic History   Marital status: Single    Spouse name: Not on file   Number of children: Not on file   Years of education: Not on file   Highest education level: Some college, no degree  Occupational History   Not on file  Tobacco Use   Smoking status: Never    Passive exposure: Never   Smokeless tobacco: Never  Vaping Use   Vaping status: Never Used  Substance and Sexual Activity  Alcohol use: Yes    Comment: social   Drug use: Never   Sexual activity: Not on file  Other Topics Concern   Not on file  Social History Narrative   Not on file   Social Drivers of Health   Financial Resource Strain: Low Risk  (06/20/2023)   Overall Financial Resource Strain (CARDIA)    Difficulty of Paying Living Expenses: Not hard at all  Food Insecurity: No Food Insecurity (06/20/2023)   Hunger Vital Sign    Worried About Running Out of Food in the Last Year: Never true    Ran Out of Food in the Last Year: Never true  Transportation Needs: No Transportation Needs (06/20/2023)   PRAPARE - Administrator, Civil Service (Medical): No    Lack of Transportation (Non-Medical): No  Physical Activity: Inactive (06/20/2023)   Exercise Vital Sign    Days of Exercise per Week: 0 days    Minutes of Exercise per Session: 0 min  Stress: Stress Concern Present (06/20/2023)   [employer] of Occupational Health - Occupational Stress Questionnaire    Feeling of Stress : Rather much  Social Connections: Moderately Integrated (06/20/2023)   Social Connection and Isolation  Panel    Frequency of Communication with Friends and Family: More than three times a week    Frequency of Social Gatherings with Friends and Family: Never    Attends Religious Services: More than 4 times per year    Active Member of [employer] or Organizations: Yes    Attends Engineer, structural: More than 4 times per year    Marital Status: Never married    Outpatient Medications Prior to Visit  Medication Sig Dispense Refill   certolizumab pegol  (CIMZIA ) 2 X 200 MG KIT Inject 400 mg into the skin every 28 (twenty-eight) days.     cetirizine  (ZYRTEC ) 10 MG tablet Take 10 mg by mouth daily as needed for allergies.     clotrimazole-betamethasone (LOTRISONE) cream SMARTSIG:Topical Morning-Evening     diazepam  (VALIUM ) 5 MG tablet TAKE 0.5-1 TABLETS (2.5-5 MG TOTAL) BY MOUTH AT BEDTIME AS NEEDED (TMJ).     ipratropium (ATROVENT ) 0.03 % nasal spray Place 2 sprays into both nostrils 2 (two) times daily. 30 mL 0   Levonorgestrel (KYLEENA) 19.5 MG IUD 19.5 mg by Intrauterine route once.     NON FORMULARY Pt uses cpap nightly     nystatin -triamcinolone  ointment (MYCOLOG) Apply 1 Application topically as needed.     prednisoLONE  acetate (PRED FORTE ) 1 % ophthalmic suspension Place 1 drop into the left eye 2 (two) times daily. As needed     SUMAtriptan  (TOSYMRA ) 10 MG/ACT SOLN Place 10 mg into the nose once as needed for up to 1 dose. May repeat after 1 hour.  Maximum 2 sprays in 24 hours. 6 each 11   amLODipine  (NORVASC ) 5 MG tablet Take 1.5 tablets (7.5 mg total) by mouth daily. 135 tablet 1   clindamycin-benzoyl peroxide (BENZACLIN) gel Apply 1 Application topically as needed. (Patient not taking: Reported on 05/07/2024)     levofloxacin  (LEVAQUIN ) 750 MG tablet Take 750 mg by mouth daily. (Patient not taking: Reported on 05/07/2024)     Facility-Administered Medications Prior to Visit  Medication Dose Route Frequency Provider Last Rate Last Admin   0.9 %  sodium chloride  infusion  500 mL  Intravenous Continuous Eda Iha, MD         EXAM:  BP 130/88 (BP Location: Left Arm, Patient Position: Sitting, Cuff  Size: Large)   Pulse 83   Temp (!) 96.3 F (35.7 C) (Temporal)   Ht 5' 4 (1.626 m)   Wt 278 lb (126.1 kg)   SpO2 98%   BMI 47.72 kg/m   Body mass index is 47.72 kg/m.  GENERAL: vitals reviewed and listed above, alert, oriented, appears well hydrated and in no acute distress HEENT: atraumatic, conjunctiva  clear, no obvious abnormalities on inspection of external nose and ears  right upper vertex area walnut sized soft  cyst seem attack to skull no pore seen  NECK: no obvious masses on inspection palpation  CV: HRRR, no clubbing cyanosis or  peripheral edema nl cap refill  MS: moves all extremities left ankel  with 1+ swelling lateral  no point tenderness  neg drawer no bruising  no redness ( reviewed pix and was red at that time)  r leg no changes  PSYCH: pleasant and cooperative, no obvious depression or anxiety Lab Results  Component Value Date   WBC 4.5 01/24/2024   HGB 12.3 01/24/2024   HCT 37.9 01/24/2024   PLT 275 01/24/2024   GLUCOSE 88 01/24/2024   CHOL 173 11/02/2022   TRIG 37 11/02/2022   HDL 80 11/02/2022   LDLCALC 82 11/02/2022   ALT 13 01/24/2024   AST 17 01/24/2024   NA 140 01/24/2024   K 5.0 01/24/2024   CL 105 01/24/2024   CREATININE 0.94 01/24/2024   BUN 19 01/24/2024   CO2 30 01/24/2024   TSH 1.46 07/02/2020   INR 1.1 (H) 02/28/2021   HGBA1C 5.4 06/20/2023   BP Readings from Last 3 Encounters:  05/07/24 130/88  04/24/24 117/75  03/20/24 132/87    ASSESSMENT AND PLAN:  Discussed the following assessment and plan:  Left ankle swelling - uncertain cause  Scalp cyst - monitor rehcek in inc  or sx  Rheumatoid arthritis involving multiple sites with positive rheumatoid factor (HCC)  Essential hypertension - refill med today Improving  denies injury Atypical but does have RA but not the pain of such    Less  likely  consider vascular evaluation   since some edema above ankle at some point.  Asymmetry makes less likely to be from meds( amlodipine )   refill today  Will send info to dr D  team and ask for them to evaluated  ( unable to do x ray in  office today) over all is improving but  apparent wax and wane .   -Patient advised to return or notify health care team  if  new concerns arise. I personally spent a total of 34 minutes in the care of the patient today including preparing to see the patient, getting/reviewing separately obtained history, performing a medically appropriate exam/evaluation, referring and communicating with other health care professionals, documenting clinical information in the EHR, and coordinating care.  Patient Instructions  I think the skin changes are from   the swelling and inflammation that  you had .  We can get  a plain x ray to show no surprises. But you can see if rheumatology  can help with this  in the near future appt.  Not sure   why you had this.   See  the rheumatologist also for opinion.    Less likely to be venous  problem ?     Donnel Venuto K. Wiley Magan M.D.

## 2024-05-07 NOTE — Patient Instructions (Signed)
 I think the skin changes are from   the swelling and inflammation that  you had .  We can get  a plain x ray to show no surprises. But you can see if rheumatology  can help with this  in the near future appt.  Not sure   why you had this.   See  the rheumatologist also for opinion.    Less likely to be venous  problem ?

## 2024-05-22 ENCOUNTER — Ambulatory Visit: Attending: Rheumatology

## 2024-05-22 VITALS — BP 156/89

## 2024-05-22 DIAGNOSIS — Z79899 Other long term (current) drug therapy: Secondary | ICD-10-CM

## 2024-05-22 DIAGNOSIS — M0579 Rheumatoid arthritis with rheumatoid factor of multiple sites without organ or systems involvement: Secondary | ICD-10-CM | POA: Diagnosis not present

## 2024-05-22 DIAGNOSIS — E785 Hyperlipidemia, unspecified: Secondary | ICD-10-CM

## 2024-05-22 MED ORDER — CERTOLIZUMAB PEGOL 2 X 200 MG ~~LOC~~ KIT
400.0000 mg | PACK | Freq: Once | SUBCUTANEOUS | Status: AC
Start: 2024-05-22 — End: 2024-05-22
  Administered 2024-05-22: 400 mg via SUBCUTANEOUS

## 2024-05-22 NOTE — Progress Notes (Signed)
 Subjective:   Patient presents to clinic today to receive monthly dose of Cimzia .  Patient running a fever or have signs/symptoms of infection? No  Patient currently on antibiotics for the treatment of infection? Yes  Patient have any upcoming invasive procedures/surgeries? No  Objective: CMP     Component Value Date/Time   NA 140 01/24/2024 1510   NA 138 06/11/2018 1212   K 5.0 01/24/2024 1510   CL 105 01/24/2024 1510   CO2 30 01/24/2024 1510   GLUCOSE 88 01/24/2024 1510   BUN 19 01/24/2024 1510   BUN 9 06/11/2018 1212   CREATININE 0.94 01/24/2024 1510   CALCIUM 9.6 01/24/2024 1510   PROT 8.2 (H) 01/24/2024 1510   PROT 8.0 06/11/2018 1212   ALBUMIN 3.8 03/16/2021 1417   ALBUMIN 3.8 06/11/2018 1212   AST 17 01/24/2024 1510   ALT 13 01/24/2024 1510   ALKPHOS 67 03/16/2021 1417   BILITOT 0.4 01/24/2024 1510   BILITOT 0.3 06/11/2018 1212   GFRNONAA >60 03/30/2021 0310   GFRNONAA 72 07/02/2020 1106   GFRAA 84 07/02/2020 1106    CBC    Component Value Date/Time   WBC 4.5 01/24/2024 1510   RBC 4.02 01/24/2024 1510   HGB 12.3 01/24/2024 1510   HGB 11.9 06/11/2018 1212   HCT 37.9 01/24/2024 1510   HCT 36.6 06/11/2018 1212   PLT 275 01/24/2024 1510   PLT 310 06/11/2018 1212   MCV 94.3 01/24/2024 1510   MCV 93 06/11/2018 1212   MCH 30.6 01/24/2024 1510   MCHC 32.5 01/24/2024 1510   RDW 13.1 01/24/2024 1510   RDW 14.4 06/11/2018 1212   LYMPHSABS 3,283 04/16/2023 1527   LYMPHSABS 2.3 06/11/2018 1212   MONOABS 0.5 02/28/2021 1614   EOSABS 117 01/24/2024 1510   EOSABS 0.2 06/11/2018 1212   BASOSABS 18 01/24/2024 1510   BASOSABS 0.0 06/11/2018 1212    Baseline Immunosuppressant Therapy Labs TB GOLD    Latest Ref Rng & Units 07/19/2023    3:09 PM  Quantiferon TB Gold  Quantiferon TB Gold Plus NEGATIVE NEGATIVE    Hepatitis Panel    Latest Ref Rng & Units 06/13/2019    2:46 PM  Hepatitis  Hep B Surface Ag NON-REACTI NON-REACTIVE   Hep B IgM NON-REACTI  NON-REACTIVE   Hep C Ab NON-REACTI NON-REACTIVE   Hep A IgM NON-REACTI NON-REACTIVE    HIV Lab Results  Component Value Date   HIV NON-REACTIVE 02/18/2020   Immunoglobulins    Latest Ref Rng & Units 02/18/2020    9:04 AM  Immunoglobulin Electrophoresis  IgA  47 - 310 mg/dL 348   IgG 399 - 8,359 mg/dL 7,781   IgM 50 - 699 mg/dL 78    SPEP    Latest Ref Rng & Units 01/24/2024    3:10 PM  Serum Protein Electrophoresis  Total Protein 6.1 - 8.1 g/dL 8.2    H3EI No results found for: G6PDH TPMT No results found for: TPMT   Chest x-ray: 01/27/2022 No acute cardiopulmonary process   Assessment/Plan:   Administrations This Visit     certolizumab pegol  (CIMZIA ) kit 400 mg     Admin Date 05/22/2024 Action Given Dose 400 mg Route Subcutaneous Documented By Burl Francina HERO, CMA             Patient tolerated injection well.   Appointment for next injection scheduled for June 19, 2024.  Patient due for TB Gold in October and routine labs in December.  Patient  is to call and reschedule appointment if running a fever with signs/symptoms of infection, on antibiotics for active infection or has an upcoming invasive procedure.  All questions encouraged and answered.  Instructed patient to call with any further questions or concerns.

## 2024-05-23 ENCOUNTER — Ambulatory Visit: Payer: Self-pay | Admitting: Rheumatology

## 2024-05-23 LAB — COMPREHENSIVE METABOLIC PANEL WITH GFR
AG Ratio: 0.9 (calc) — ABNORMAL LOW (ref 1.0–2.5)
ALT: 18 U/L (ref 6–29)
AST: 21 U/L (ref 10–35)
Albumin: 4.1 g/dL (ref 3.6–5.1)
Alkaline phosphatase (APISO): 87 U/L (ref 31–125)
BUN: 20 mg/dL (ref 7–25)
CO2: 27 mmol/L (ref 20–32)
Calcium: 9.4 mg/dL (ref 8.6–10.2)
Chloride: 101 mmol/L (ref 98–110)
Creat: 0.88 mg/dL (ref 0.50–0.99)
Globulin: 4.7 g/dL — ABNORMAL HIGH (ref 1.9–3.7)
Glucose, Bld: 88 mg/dL (ref 65–99)
Potassium: 4.5 mmol/L (ref 3.5–5.3)
Sodium: 137 mmol/L (ref 135–146)
Total Bilirubin: 0.5 mg/dL (ref 0.2–1.2)
Total Protein: 8.8 g/dL — ABNORMAL HIGH (ref 6.1–8.1)
eGFR: 81 mL/min/{1.73_m2}

## 2024-05-23 LAB — CBC WITH DIFFERENTIAL/PLATELET
Absolute Lymphocytes: 2745 {cells}/uL (ref 850–3900)
Absolute Monocytes: 780 {cells}/uL (ref 200–950)
Basophils Absolute: 28 {cells}/uL (ref 0–200)
Basophils Relative: 0.6 %
Eosinophils Absolute: 61 {cells}/uL (ref 15–500)
Eosinophils Relative: 1.3 %
HCT: 37.4 % (ref 35.0–45.0)
Hemoglobin: 12.4 g/dL (ref 11.7–15.5)
MCH: 30.8 pg (ref 27.0–33.0)
MCHC: 33.2 g/dL (ref 32.0–36.0)
MCV: 92.8 fL (ref 80.0–100.0)
MPV: 9.9 fL (ref 7.5–12.5)
Monocytes Relative: 16.6 %
Neutro Abs: 1086 {cells}/uL — ABNORMAL LOW (ref 1500–7800)
Neutrophils Relative %: 23.1 %
Platelets: 311 Thousand/uL (ref 140–400)
RBC: 4.03 Million/uL (ref 3.80–5.10)
RDW: 13.8 % (ref 11.0–15.0)
Total Lymphocyte: 58.4 %
WBC: 4.7 Thousand/uL (ref 3.8–10.8)

## 2024-05-23 LAB — LIPID PANEL
Cholesterol: 181 mg/dL (ref <200)
HDL: 70 mg/dL (ref >=50)
LDL Cholesterol (Calc): 96 mg/dL
Non-HDL Cholesterol (Calc): 111 mg/dL (ref <130)
Total CHOL/HDL Ratio: 2.6 (calc) (ref <5.0)
Triglycerides: 66 mg/dL (ref <150)

## 2024-05-23 NOTE — Progress Notes (Signed)
 CBC and CMP are stable.  Lipid panel is normal.  Please forward results to her PCP.

## 2024-06-16 NOTE — Progress Notes (Deleted)
 NEUROLOGY FOLLOW UP OFFICE NOTE  Jennifer [name] [id number]  Assessment/Plan:   Migraine without aura, without status migrainosus, not intractable Hypertension  Migraine prevention:  She will contact her insurance to find out which of following is formulary:  Ajovy , Emgality, Qulipta.  Would not use Aimovig as she already has hypertension Migraine rescue:  Tosymra  NS - previously effective.  Already tried rizatriptan  and eletriptan .  Generic sumatriptan  NS not effective.   Limit use of pain relievers to no more than 2 days out of week to prevent risk of rebound or medication-overuse headache. Keep headache diary Follow up 4 to 5 months.  Subjective:  Jennifer [name] is a 48 year old female with rheumatoid arthritis who follows up for migraines.  UPDATE: Last seen in January 2024.  Her insurance wouldn't continue approving Ajovy , so she was requested to contact her insurance to find out which alternative CGRP inhibitors would be formulary.  We didn't hear back from her.  ***    Current NSAIDS:  Naproxen  500mg  Current analgesics:  Excedrin Migraine Current triptans:  Tosymra  10mg  Current ergotamine:  none Current anti-emetic:  none Current muscle relaxants:  Robaxin  Current anti-anxiolytic:  none Current sleep aide:  Melatonin 10mg  PRN Current Antihypertensive medications:  amlodipine  Current Antidepressant medications:  none Current Anticonvulsant medications:  gabapentin  300mg  TID PRN Current anti-CGRP:  none Current Vitamins/Herbal/Supplements:  Folic acid ; melatonin 10mg  PRN Current Antihistamines/Decongestants:  Zyrtec  Other therapy:  none Hormone/birth control:  Kyleena Other medications:  methotrexate   Caffeine:  1 to 2 cups of coffee daily.  Sometimes at 4 PM.  No soda or tea Diet:  No soda.  Four 16 oz bottles of water  daily.   Depression:  no; Anxiety:  Yes (family-related Other pain:  arthritic pain in her other knee Sleep hygiene:  Poor.  She was diagnosed  with OSA and uses a CPAP.  However, she can't turn off her mind.  4 to 6 hours of sleep a night.  Takes melatonin  HISTORY:  She started having migraines in her late-20s to early-30s.  They are severe mid-frontal/bitemporal/back of neck throbbing pain.  They are associated with seeing white spots when severe, photophobia, phonophobia, rarely nausea..  Her migraines typically last 2 hours to 2 days and occur 2 to 3 times a month.  Triggers include stress and fluorescent lights.     She developed a migraine on 09/18/2019, however it was persistent.  She was subsequently diagnosed with Covid a few days later.  The migraine did not break.  She was prescribed a prednisone  taper on 10/08/2019 and the migraine broke.  She currently reports increased family-related stress.        Past NSAIDS/ steroids:  Ibuprofen  800mg ; prednisone  Past analgesics:  Tramadol  Past abortive triptans: Maxalt  MLT 10mg ; sumatriptan  20mg  NS, eletriptan  Past abortive ergotamine:  none Past muscle relaxants:  Flexeril  Past anti-emetic:  none Past antihypertensive medications:  Labetolol; lisinopril -HCTZ Past antidepressant medications:  Doxepin  10mg  BID (hives); fluoxetine 10mg  Past anticonvulsant medications:  zonisamide 100mg  daily; topiramate (stopped due to potential memory problems), gabapentin  Past anti-CGRP:  none Past vitamins/Herbal/Supplements:  none Past antihistamines/decongestants:  none Other past therapies:  none    Family history of headache:  Aunt (used to have migraines)  PAST MEDICAL HISTORY: Past Medical History:  Diagnosis Date   Acute otitis media 08/28/2012   improved  change to liquid medication    Anxiety disorder 03/29/2016   Breast abscess of female    Recurrent   Depressive disorder  03/29/2016   Family history of adverse reaction to anesthesia    father hard time waking once (12/20/2016)   Fibroids    w/bleeding   GERD (gastroesophageal reflux disease)    History of blood transfusion     after surgery   Hypertension    Migraines    Dr. Layman Meissner; I have a few/month (12/20/2016)   Osteoarthritis    Pill esophagitis 08/28/2012   amoxicillin   by hx  disc plan nl voice except hoarse not drooling  close fu  if not getting better with plan stop aleve   liquid ibu onlyf necessary    Recurrent periodic urticaria    Rheumatoid arthritis (HCC)    55% of my body (12/20/2016)   Rheumatoid arthritis (HCC)    Sleep apnea    wears cpap    MEDICATIONS: Current Outpatient Medications on File Prior to Visit  Medication Sig Dispense Refill   amLODipine  (NORVASC ) 5 MG tablet Take 1.5 tablets (7.5 mg total) by mouth daily. 135 tablet 1   certolizumab pegol  (CIMZIA ) 2 X 200 MG KIT Inject 400 mg into the skin every 28 (twenty-eight) days.     cetirizine  (ZYRTEC ) 10 MG tablet Take 10 mg by mouth daily as needed for allergies.     clindamycin-benzoyl peroxide (BENZACLIN) gel Apply 1 Application topically as needed. (Patient not taking: Reported on 05/07/2024)     clotrimazole-betamethasone (LOTRISONE) cream SMARTSIG:Topical Morning-Evening     diazepam  (VALIUM ) 5 MG tablet TAKE 0.5-1 TABLETS (2.5-5 MG TOTAL) BY MOUTH AT BEDTIME AS NEEDED (TMJ).     ipratropium (ATROVENT ) 0.03 % nasal spray Place 2 sprays into both nostrils 2 (two) times daily. 30 mL 0   Levonorgestrel (KYLEENA) 19.5 MG IUD 19.5 mg by Intrauterine route once.     NON FORMULARY Pt uses cpap nightly     nystatin -triamcinolone  ointment (MYCOLOG) Apply 1 Application topically as needed.     prednisoLONE  acetate (PRED FORTE ) 1 % ophthalmic suspension Place 1 drop into the left eye 2 (two) times daily. As needed     SUMAtriptan  (TOSYMRA ) 10 MG/ACT SOLN Place 10 mg into the nose once as needed for up to 1 dose. May repeat after 1 hour.  Maximum 2 sprays in 24 hours. 6 each 11   [DISCONTINUED] eletriptan  (RELPAX ) 40 MG tablet Take 1 tablet (40 mg total) by mouth as needed for migraine or headache. May repeat in 2 hours if headache  persists or recurs. 10 tablet 1   Current Facility-Administered Medications on File Prior to Visit  Medication Dose Route Frequency Provider Last Rate Last Admin   0.9 %  sodium chloride  infusion  500 mL Intravenous Continuous Eda Iha, MD          ALLERGIES: Allergies  Allergen Reactions   Lisinopril  Anaphylaxis   Lisinopril -Hydrochlorothiazide  Swelling and Other (See Comments)    Angioedema  Has tolerated maxide in past so most likely  The ACE inhibitor as the cause     FAMILY HISTORY: Family History  Problem Relation Age of Onset   Hypertension Mother    Rheum arthritis Mother    Hypertension Father    Sleep apnea Father    Breast cancer Neg Hx    Colon cancer Neg Hx    Colon polyps Neg Hx    Esophageal cancer Neg Hx    Rectal cancer Neg Hx    Stomach cancer Neg Hx       Objective:  *** General: No acute distress.  Patient appears well-groomed.  Head:  Normocephalic/atraumatic Neck:  Supple.  No paraspinal tenderness.  Full range of motion. Heart:  Regular rate and rhythm. Neuro:  Alert and oriented.  Speech fluent and not dysarthric.  Language intact.  CN II-XII intact.  Bulk and tone normal.  Muscle strength 5/5 throughout.  Sensation to light touch intact.  Deep tendon reflexes 2+ throughout, toes downgoing.  Gait normal.  Romberg negative.    Juliene Dunnings, DO  CC: Apolinar Eastern, MD

## 2024-06-17 ENCOUNTER — Ambulatory Visit: Admitting: Neurology

## 2024-06-17 NOTE — Progress Notes (Signed)
 Office Visit Note  Patient: Jennifer [name]             Date of Birth: March 24, 1976           MRN: [id number]             PCP: Charlett Apolinar POUR, MD Referring: Charlett Apolinar POUR, MD Visit Date: 07/01/2024 Occupation: Data Unavailable  Subjective:  Shortness of breath  History of Present Illness: Jennifer [name] is a 48 y.o. female with seropositive rheumatoid arthritis and osteoarthritis.  She returns today after her last visit in May 2025.  She states she developed a cough about 2 weeks ago which gradually got worse.  She states that June 21, 2024 she developed shortness of breath.  She decided to go to the urgent care where she had a chest x-ray.  The chest x-ray revealed a pleural effusion and she was sent to the emergency room.  A CT scan was obtained which showed moderate volume right pleural effusion with associated right lower lobe atelectasis.  She had ultrasound-guided thoracentesis and 1.1 L of fluid was removed.  The cultures came negative.  She was initially started on azathioprine and ceftriaxone  which were discontinued.  Pleural effusion was attributed to rheumatoid arthritis and not infection.  She was started on Lasix 40 mg twice daily for 30 days and prednisone  taper.  She was also titrated off the oxygen.  Her blood pressure was elevated 204/131 in the hospital and was given amlodipine  10 mg p.o. daily.  She took prednisone  20 mg for 5 days and currently is taking prednisone  10 mg a day.  She continues to have some shortness of breath and cough.  She has an appointment coming up with the pulmonologist on July 09, 2024. She denies any flares of rheumatoid arthritis.  She denies any joint pain or joint swelling.  She has been she states she has missed Cimzia  injections for couple of weeks in the past but not recently.  She has been taking Cimzia  400 mg subcu every 28 days.  She has been off Rasuvo  since May 2025 due to hair loss.    Activities of Daily Living:  Patient reports  morning stiffness for 20 minutes.   Patient Reports nocturnal pain.  Difficulty dressing/grooming: Denies Difficulty climbing stairs: Denies Difficulty getting out of chair: Denies Difficulty using hands for taps, buttons, cutlery, and/or writing: Reports  Review of Systems  Constitutional:  Positive for fatigue.  HENT:  Positive for mouth dryness. Negative for mouth sores.   Eyes:  Negative for dryness.  Respiratory:  Positive for cough and shortness of breath.   Cardiovascular:  Positive for chest pain. Negative for palpitations.  Gastrointestinal:  Negative for blood in stool, constipation and diarrhea.  Endocrine: Negative for increased urination.  Genitourinary:  Negative for involuntary urination.  Musculoskeletal:  Positive for joint swelling and morning stiffness. Negative for joint pain, gait problem, joint pain, myalgias, muscle weakness, muscle tenderness and myalgias.  Skin:  Positive for color change. Negative for rash, hair loss and sensitivity to sunlight.  Allergic/Immunologic: Positive for susceptible to infections.  Neurological:  Negative for dizziness and headaches.  Hematological:  Negative for swollen glands.  Psychiatric/Behavioral:  Positive for sleep disturbance. Negative for depressed mood. The patient is nervous/anxious.     PMFS History:  Patient Active Problem List   Diagnosis Date Noted   Pleural effusion 06/23/2024   Hidradenitis suppurativa 11/23/2022   OSA (obstructive sleep apnea) 02/23/2022   Morbid obesity (HCC)  02/23/2022   History of total knee replacement, right 07/22/2021   Primary osteoarthritis of right knee 03/28/2021   OA (osteoarthritis) of knee 04/02/2020   Pain in right knee 01/13/2020   Chronic pain of right knee 01/21/2019   S/P right knee arthroscopy 07/23/2018   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   Primary osteoarthritis of both hands 02/04/2018   Sleep apnea, obstructive 09/28/2017   Primary osteoarthritis of both knees 05/22/2017    Chronic migraine without aura without status migrainosus, not intractable 01/18/2017   Incisional hernia 12/18/2016   Leiomyoma of uterus 09/06/2016   Anxiety disorder 03/29/2016   Depressive disorder 03/29/2016   Fibroid, uterine    Essential hypertension 05/25/2015   Recurrent headache 05/25/2015   Head lump 07/31/2014   Edema 12/29/2013   Baker's cyst, ruptured 12/25/2013   Cough, persistent 12/12/2013   Left leg swelling 12/12/2013   Cough 12/12/2013   High risk medication use 12/12/2013   Recurrent periodic urticaria    Acne vulgaris 11/21/2012   Postinflammatory hyperpigmentation 11/21/2012   Rheumatoid arthritis (HCC) 08/28/2012   CHRONIC LARYNGITIS 11/03/2010   Allergic rhinitis 11/03/2010   PARESTHESIA 07/28/2010   DYSMENORRHEA 10/15/2007   FIBROIDS, UTERUS 07/24/2007   OBESITY 07/24/2007   SLEEPLESSNESS 07/24/2007   HEADACHE 07/24/2007    Past Medical History:  Diagnosis Date   Acute otitis media 08/28/2012   improved  change to liquid medication    Anxiety disorder 03/29/2016   Breast abscess of female    Recurrent   Depressive disorder 03/29/2016   Family history of adverse reaction to anesthesia    father hard time waking once (12/20/2016)   Fibroids    w/bleeding   GERD (gastroesophageal reflux disease)    History of blood transfusion    after surgery   Hypertension    Migraines    Dr. Layman Meissner; I have a few/month (12/20/2016)   Osteoarthritis    Pill esophagitis 08/28/2012   amoxicillin   by hx  disc plan nl voice except hoarse not drooling  close fu  if not getting better with plan stop aleve   liquid ibu onlyf necessary    Recurrent periodic urticaria    Rheumatoid arthritis (HCC)    55% of my body (12/20/2016)   Rheumatoid arthritis (HCC)    Sleep apnea    wears cpap    Family History  Problem Relation Age of Onset   Hypertension Mother    Rheum arthritis Mother    Hypertension Father    Sleep apnea Father    Breast cancer Neg Hx     Colon cancer Neg Hx    Colon polyps Neg Hx    Esophageal cancer Neg Hx    Rectal cancer Neg Hx    Stomach cancer Neg Hx    Past Surgical History:  Procedure Laterality Date   BREAST SURGERY     abcess under left breast    CYST EXCISION Left 2014   elbow   HERNIA REPAIR     INCISION AND DRAINAGE BREAST ABSCESS Left 2003   INCISIONAL HERNIA REPAIR N/A 12/18/2016   Procedure: REPAIR INCISIONAL HERNIA WITH MESH;  Surgeon: Dann Hummer, MD;  Location: [facility];  Service: General;  Laterality: N/A;   INSERTION OF MESH N/A 12/18/2016   Procedure: INSERTION OF MESH;  Surgeon: Dann Hummer, MD;  Location: [facility];  Service: General;  Laterality: N/A;   IR THORACENTESIS ASP PLEURAL SPACE W/IMG GUIDE  06/24/2024   KNEE ARTHROSCOPY WITH MENISCAL REPAIR Right 06/17/2018  Procedure: RIGHT KNEE ARTHROSCOPY WITH MENISCAL REPAIR;  Surgeon: Rubie Kemps, MD;  Location: [facility];  Service: Orthopedics;  Laterality: Right;   MYOMECTOMY N/A 09/06/2016   Procedure: EZZIE MERL;  Surgeon: Cynthia Loss, MD;  Location: [facility];  Service: Gynecology;  Laterality: N/A;   ROOT CANAL  05/2022   TOTAL KNEE ARTHROPLASTY Right 03/28/2021   Procedure: TOTAL KNEE ARTHROPLASTY;  Surgeon: Melodi Lerner, MD;  Location: [facility];  Service: Orthopedics;  Laterality: Right;    UTERINE FIBROID SURGERY     34 fibroids   Social History   Tobacco Use   Smoking status: Never    Passive exposure: Never   Smokeless tobacco: Never  Vaping Use   Vaping status: Never Used  Substance Use Topics   Alcohol use: Yes    Comment: social   Drug use: Never   Social History   Social History Narrative   Not on file     Immunization History  Administered Date(s) Administered   Influenza Split 08/21/2012   Influenza,inj,Quad PF,6+ Mos 07/31/2014, 08/19/2019   Pneumococcal Conjugate-13 08/28/2012   Unspecified SARS-COV-2 Vaccination 01/09/2020, 01/30/2020, 09/24/2020     Objective: Vital Signs: BP  129/88   Pulse 73   Temp (!) 97.5 F (36.4 C)   Resp 17   Ht 5' 4 (1.626 m)   Wt 272 lb (123.4 kg)   BMI 46.69 kg/m    Physical Exam Vitals and nursing note reviewed.  Constitutional:      Appearance: She is well-developed.  HENT:     Head: Normocephalic and atraumatic.  Eyes:     Conjunctiva/sclera: Conjunctivae normal.  Cardiovascular:     Rate and Rhythm: Normal rate and regular rhythm.     Heart sounds: Normal heart sounds.  Pulmonary:     Effort: Pulmonary effort is normal.     Breath sounds: Normal breath sounds.  Abdominal:     General: Bowel sounds are normal.     Palpations: Abdomen is soft.  Musculoskeletal:     Cervical back: Normal range of motion.  Lymphadenopathy:     Cervical: No cervical adenopathy.  Skin:    General: Skin is warm and dry.     Capillary Refill: Capillary refill takes less than 2 seconds.  Neurological:     Mental Status: She is alert and oriented to person, place, and time.  Psychiatric:        Behavior: Behavior normal.      Musculoskeletal Exam: Cervical, thoracic and lumbar spine were in good range of motion.  There was no SI joint tenderness.  Shoulder joints, elbow joints, wrist joints, MCPs, PIPs and DIPs were in good range of motion with no synovitis.  Hip joints and knee joints were in good range of motion without any warmth swelling or effusion.  There was no tenderness over ankles or MTPs.   CDAI Exam: CDAI Score: -- Patient Global: --; Provider Global: -- Swollen: --; Tender: -- Joint Exam 07/01/2024   No joint exam has been documented for this visit   There is currently no information documented on the homunculus. Go to the Rheumatology activity and complete the homunculus joint exam.  Investigation: No additional findings.  Imaging: DG CHEST PORT 1 VIEW Result Date: 06/26/2024 CLINICAL DATA:  Dyspnea EXAM: PORTABLE CHEST 1 VIEW COMPARISON:  06/25/2024 FINDINGS: Stable grossly normal-sized heart, moderate-sized  right pleural effusion and right lower lung zone patchy density. Clear left lung. Unremarkable bones. IMPRESSION: Stable moderate-sized right pleural effusion and right  lower lung zone patchy atelectasis or pneumonia. Electronically Signed   By: Elspeth Bathe M.D.   On: 06/26/2024 10:56   ECHOCARDIOGRAM COMPLETE Result Date: 06/25/2024    ECHOCARDIOGRAM REPORT   Patient Name:   LANETT LASORSA Date of Exam: 06/25/2024 Medical Rec #:  [id number]      Height:       64.0 in Accession #:    [id number]     Weight:       270.0 lb Date of Birth:  23-Nov-1975      BSA:          2.223 m Patient Age:    48 years       BP:           149/101 mmHg Patient Gender: F              HR:           76 bpm. Exam Location:  Inpatient Procedure: 2D Echo, Color Doppler and Cardiac Doppler (Both Spectral and Color            Flow Doppler were utilized during procedure). Indications:    Dyspnea R06.00  History:        Patient has no prior history of Echocardiogram examinations.                 Arrythmias:Tachycardia, Signs/Symptoms:Shortness of Breath; Risk                 Factors:Hypertension and Sleep Apnea. H/O Pleural effusion.  Sonographer:    BERNARDA ROCKS Referring Phys: [id number] ALEJANDRO LATIF SHEIKH IMPRESSIONS  1. Left ventricular ejection fraction, by estimation, is 60 to 65%. The left ventricle has normal function. The left ventricle has no regional wall motion abnormalities. Left ventricular diastolic parameters were normal.  2. Right ventricular systolic function is normal. The right ventricular size is normal. Tricuspid regurgitation signal is inadequate for assessing PA pressure.  3. Left atrial size was mildly dilated.  4. The mitral valve is normal in structure. No evidence of mitral valve regurgitation. No evidence of mitral stenosis.  5. The aortic valve is normal in structure. Aortic valve regurgitation is not visualized. No aortic stenosis is present.  6. The inferior vena cava is dilated in size with <50% respiratory  variability, suggesting right atrial pressure of 15 mmHg. Comparison(s): No prior Echocardiogram. Conclusion(s)/Recommendation(s): Otherwise normal echocardiogram, with minor abnormalities described in the report. FINDINGS  Left Ventricle: Left ventricular ejection fraction, by estimation, is 60 to 65%. The left ventricle has normal function. The left ventricle has no regional wall motion abnormalities. The left ventricular internal cavity size was normal in size. There is  no left ventricular hypertrophy. Left ventricular diastolic parameters were normal. Right Ventricle: The right ventricular size is normal. No increase in right ventricular wall thickness. Right ventricular systolic function is normal. Tricuspid regurgitation signal is inadequate for assessing PA pressure. Left Atrium: Left atrial size was mildly dilated. Right Atrium: Right atrial size was normal in size. Pericardium: There is no evidence of pericardial effusion. Mitral Valve: The mitral valve is normal in structure. No evidence of mitral valve regurgitation. No evidence of mitral valve stenosis. MV peak gradient, 2.7 mmHg. The mean mitral valve gradient is 1.0 mmHg. Tricuspid Valve: The tricuspid valve is normal in structure. Tricuspid valve regurgitation is trivial. No evidence of tricuspid stenosis. Aortic Valve: The aortic valve is normal in structure. Aortic valve regurgitation is not visualized. No aortic stenosis is present. Aortic valve mean gradient  measures 4.0 mmHg. Aortic valve peak gradient measures 8.6 mmHg. Aortic valve area, by VTI measures 3.44 cm. Pulmonic Valve: The pulmonic valve was normal in structure. Pulmonic valve regurgitation is trivial. No evidence of pulmonic stenosis. Aorta: The aortic root and ascending aorta are structurally normal, with no evidence of dilitation. Venous: The inferior vena cava is dilated in size with less than 50% respiratory variability, suggesting right atrial pressure of 15 mmHg. IAS/Shunts:  No atrial level shunt detected by color flow Doppler.  LEFT VENTRICLE PLAX 2D LVIDd:         4.60 cm      Diastology LVIDs:         2.90 cm      LV e' medial:    9.90 cm/s LV PW:         0.80 cm      LV E/e' medial:  6.6 LV IVS:        1.00 cm      LV e' lateral:   14.40 cm/s LVOT diam:     2.20 cm      LV E/e' lateral: 4.6 LV SV:         93 LV SV Index:   42 LVOT Area:     3.80 cm  LV Volumes (MOD) LV vol d, MOD A2C: 102.0 ml LV vol d, MOD A4C: 134.0 ml LV vol s, MOD A2C: 36.2 ml LV vol s, MOD A4C: 49.8 ml LV SV MOD A2C:     65.8 ml LV SV MOD A4C:     134.0 ml LV SV MOD BP:      76.6 ml RIGHT VENTRICLE             IVC RV Basal diam:  3.30 cm     IVC diam: 2.10 cm RV S prime:     15.30 cm/s TAPSE (M-mode): 2.3 cm LEFT ATRIUM             Index        RIGHT ATRIUM           Index LA diam:        2.60 cm 1.17 cm/m   RA Area:     15.00 cm LA Vol (A2C):   52.6 ml 23.67 ml/m  RA Volume:   41.40 ml  18.63 ml/m LA Vol (A4C):   53.2 ml 23.94 ml/m LA Biplane Vol: 53.5 ml 24.07 ml/m  AORTIC VALVE                    PULMONIC VALVE AV Area (Vmax):    3.28 cm     PV Vmax:          1.01 m/s AV Area (Vmean):   3.07 cm     PV Peak grad:     4.1 mmHg AV Area (VTI):     3.44 cm     PR End Diast Vel: 5.15 msec AV Vmax:           147.00 cm/s AV Vmean:          95.800 cm/s AV VTI:            0.271 m AV Peak Grad:      8.6 mmHg AV Mean Grad:      4.0 mmHg LVOT Vmax:         127.00 cm/s LVOT Vmean:        77.300 cm/s LVOT VTI:  0.245 m LVOT/AV VTI ratio: 0.90  AORTA Ao Root diam: 3.00 cm Ao Asc diam:  3.30 cm MITRAL VALVE MV Area (PHT): 3.65 cm    SHUNTS MV Area VTI:   4.33 cm    Systemic VTI:  0.24 m MV Peak grad:  2.7 mmHg    Systemic Diam: 2.20 cm MV Mean grad:  1.0 mmHg MV Vmax:       0.82 m/s MV Vmean:      54.6 cm/s MV Decel Time: 208 msec MV E velocity: 65.60 cm/s MV A velocity: 70.00 cm/s MV E/A ratio:  0.94 Emeline Calender Electronically signed by Emeline Calender Signature Date/Time: 06/25/2024/2:45:26 PM    Final     DG CHEST PORT 1 VIEW Result Date: 06/25/2024 CLINICAL DATA:  Shortness of breath.  Pleural effusion. EXAM: PORTABLE CHEST 1 VIEW COMPARISON:  06/24/2024 FINDINGS: Small to moderate right pleural effusion and right lower lung atelectasis or infiltrate show no significant change. Left lung remains clear. Heart size is stable. IMPRESSION: No significant change in small to moderate right pleural effusion, and right lower lung atelectasis or infiltrate. Electronically Signed   By: Norleen DELENA Kil M.D.   On: 06/25/2024 06:41   IR THORACENTESIS ASP PLEURAL SPACE W/IMG GUIDE Result Date: 06/24/2024 INDICATION: History hypertension, GERD, RA, migraine. Presented to the ED for cough and shortness of breath. Found to have a large right pleural effusion on chest x-ray. IR consulted for diagnostic and therapeutic right thoracentesis. EXAM: ULTRASOUND GUIDED DIAGNOSTIC AND THERAPEUTIC RIGHT THORACENTESIS MEDICATIONS: 9 mL 1% lidocaine  COMPLICATIONS: None immediate. PROCEDURE: An ultrasound guided thoracentesis was thoroughly discussed with the patient and questions answered. The benefits, risks, alternatives and complications were also discussed. The patient understands and wishes to proceed with the procedure. Written consent was obtained. Ultrasound was performed to localize and mark an adequate pocket of fluid in the right chest. The area was then prepped and draped in the normal sterile fashion. 1% Lidocaine  was used for local anesthesia. Under ultrasound guidance a 6 Fr Safe-T-Centesis catheter was introduced. Thoracentesis was performed. The catheter was removed and a dressing applied. FINDINGS: A total of approximately 1.1 liters of straw colored fluid was removed. Samples were sent to the laboratory as requested by the clinical team. IMPRESSION: Successful ultrasound guided right thoracentesis yielding 1.1 liters of pleural fluid. Performed and dictated by Kimble Clas, PA-C Electronically Signed   By: Wilkie Lent M.D.   On: 06/24/2024 13:24   DG Chest Port 1 View Result Date: 06/24/2024 CLINICAL DATA:  Status post right-sided thoracentesis. EXAM: PORTABLE CHEST 1 VIEW COMPARISON:  Chest CT 06/23/2024 FINDINGS: Interval reduction in size of the right-sided pleural effusion with a small residual effusion and moderate overlying atelectasis. No postprocedural pneumothorax is identified. IMPRESSION: 1. Interval reduction in size of right-sided pleural effusion with a small residual effusion and moderate overlying atelectasis. 2. No postprocedural pneumothorax. Electronically Signed   By: MYRTIS Stammer M.D.   On: 06/24/2024 11:45   CT Chest W Contrast Result Date: 06/23/2024 CLINICAL DATA:  shob, large effusion noted on xray from urgent care EXAM: CT CHEST WITH CONTRAST TECHNIQUE: Multidetector CT imaging of the chest was performed during intravenous contrast administration. RADIATION DOSE REDUCTION: This exam was performed according to the departmental dose-optimization program which includes automated exposure control, adjustment of the mA and/or kV according to patient size and/or use of iterative reconstruction technique. CONTRAST:  75mL OMNIPAQUE  IOHEXOL  300 MG/ML  SOLN COMPARISON:  CT abdomen pelvis 12/07/2016. FINDINGS: Cardiovascular: Normal heart  size. No significant pericardial effusion. The thoracic aorta is normal in caliber. No atherosclerotic plaque of the thoracic aorta. No coronary artery calcifications. Mediastinum/Nodes: Multiple enlarged mediastinal and right hilar lymph nodes. As an example a 2 cm right hilar and 1.7 cm right paratracheal lymph nodes. Enlarged precardiac lymph nodes measuring up to 1.1 cm (3:43). No enlarged left hilar or axillary lymph nodes. Thyroid  gland, trachea, and esophagus demonstrate no significant findings. At least small volume hiatal hernia. Lungs/Pleura: Partial collapse of the right lower lobe and right middle lobe. No focal consolidation. No pulmonary nodule. No  pulmonary mass. Moderate volume right pleural effusion. No left pleural effusion. No pneumothorax. Upper Abdomen: No acute abnormality. Musculoskeletal: No chest wall abnormality. No suspicious lytic or blastic osseous lesions. No acute displaced fracture. IMPRESSION: 1. Moderate volume right pleural effusion. Associated partial collapse of the right lower lobe and right middle lobe. Recommend thoracentesis with cytology for further evaluation of possible underlying malignancy. 2. Right hilar, mediastinal, pericardiac lymphadenopathy. Recommend attention on follow-up. 3. At least small volume hiatal hernia. Electronically Signed   By: Morgane  Naveau M.D.   On: 06/23/2024 20:41    Recent Labs: Lab Results  Component Value Date   WBC 5.4 06/25/2024   HGB 11.3 (L) 06/25/2024   PLT 316 06/25/2024   NA 135 06/26/2024   K 3.5 06/26/2024   CL 101 06/26/2024   CO2 24 06/26/2024   GLUCOSE 98 06/26/2024   BUN 16 06/26/2024   CREATININE 1.02 (H) 06/26/2024   BILITOT 0.5 06/25/2024   ALKPHOS 84 06/25/2024   AST 27 06/25/2024   ALT 11 06/25/2024   PROT 8.6 (H) 06/25/2024   ALBUMIN 3.5 06/25/2024   CALCIUM 9.0 06/26/2024   GFRAA 84 07/02/2020   QFTBGOLDPLUS NEGATIVE 07/19/2023    Speciality Comments: Prior therapy: Plaquenil (inadequate response) Methotrexate  06/2019-03/2022  Patient is needs to be seen in the office every 3 months to get Cimzia  injections.  Procedures:  No procedures performed Allergies: Lisinopril  and Lisinopril -hydrochlorothiazide    Assessment / Plan:     Visit Diagnoses: Rheumatoid arthritis involving multiple sites with positive rheumatoid factor (HCC) - +CCP, +HLA B27: Patient's rheumatoid arthritis and iritis has been well-controlled with in office Cimzia  injections.  She denies any joint pain or joint swelling.  She states about 2 weeks ago she developed nonproductive cough and shortness of breath.  She was seen at the urgent care and was found to have pleural  effusion.  She was sent to the emergency room where she had CT scan and thoracentesis.  She was given empiric antibiotics and the cultures were negative.  She was given the diagnosis of pleural effusion secondary to rheumatoid arthritis.  She was discharged home on Lasix and prednisone .  She is currently on prednisone  10 mg p.o. daily.  She is tapering up gradually.  She still has some residual cough but not much shortness of breath. I did detailed discussion with the patient.  She was treated with the combination of Cimzia  and Rasuvo  in the past.  She discontinued Rasuvo  in July 2023 and due to the concern of hair loss.  She would be better off going back on Rasuvo .  Indications side effects contraindications were again discussed at length.  A handout was given and consent was taken.  Once approved we will start her on Rasuvo  if her creatinine is normal.  Her creatinine could be mildly elevated because of Lasix use.  I also discussed the option of leflunomide if her creatinine remains  elevated.  High risk medication use - - Cimzia  400 mg sq injections in the office every 28 days. (Rasuvo  20 mg sq injections, folic acid  1 mg 2 tablets daily.-Patient d/c due to hair loss). -June 25, 2024 CBC showed low hemoglobin of 11.3 and CMP was normal except creatinine was 1.07.  Will recheck CMP today.  TB Gold was negative on July 19, 2023.  Plan: Comprehensive metabolic panel with GFR, QuantiFERON-TB Gold Plus.  Birth control method is IUD.  Pleural effusion-Hospital records were reviewed.  Patient underwent ultrasound-guided thoracentesis on 10/7 with 1.1 L fluid removed.  Serum LDH 218, total protein 8.2. Pleural fluid cell count with 7662 nucleated cells, 4 neutrophils. Pleural fluid culture with no WBCs/organisms seen, no growth.  She was discharged on Lasix 40 mg p.o. twice daily for 30 days and prednisone  taper starting at 20 mg for 5 days followed by 10 mg for 5 days and 5 mg for 5 days.  She has an  appointment coming up with the pulmonologist next week.  Iritis-she has not had an recurrence of iritis since she has been on Cimzia .  Primary osteoarthritis of both hands -no synovitis was noted on the examination.  Primary osteoarthritis of left knee-she denies any increased knee joint pain.  S/p euflexxa left knee March 2024.  S/P total knee arthroplasty, right - - 03/28/21.  Doing well.  Essential hypertension-blood pressure was normal today at 129/88.  Her pressure was elevated in the hospital.  Her dose of amlodipine  was increased to 10 mg p.o. daily.  BMI 45.0-49.9, adult (HCC)-patient states that she has gained weight due to a lot of stress in her family.  Weight loss diet and exercise was emphasized.  Dyslipidemia-lipid panel was normal on May 22, 2024.  Orders: Orders Placed This Encounter  Procedures   Comprehensive metabolic panel with GFR   QuantiFERON-TB Gold Plus   No orders of the defined types were placed in this encounter.    Follow-Up Instructions: Return in about 2 months (around 08/31/2024) for Rheumatoid arthritis.   Maya Nash, MD  Note - This record has been created using Animal nutritionist.  Chart creation errors have been sought, but may not always  have been located. Such creation errors do not reflect on  the standard of medical care.

## 2024-06-19 ENCOUNTER — Ambulatory Visit: Attending: Rheumatology

## 2024-06-19 VITALS — BP 127/85 | HR 79

## 2024-06-19 DIAGNOSIS — M0579 Rheumatoid arthritis with rheumatoid factor of multiple sites without organ or systems involvement: Secondary | ICD-10-CM | POA: Diagnosis not present

## 2024-06-19 MED ORDER — CERTOLIZUMAB PEGOL 2 X 200 MG ~~LOC~~ KIT
400.0000 mg | PACK | Freq: Once | SUBCUTANEOUS | Status: AC
  Administered 2024-06-19: 400 mg via SUBCUTANEOUS

## 2024-06-19 NOTE — Progress Notes (Signed)
 Subjective:   Patient presents to clinic today to receive monthly dose of Cimzia .  Patient running a fever or have signs/symptoms of infection? No  Patient currently on antibiotics for the treatment of infection? No  Patient have any upcoming invasive procedures/surgeries? No  Objective: CMP     Component Value Date/Time   NA 137 05/22/2024 1518   NA 138 06/11/2018 1212   K 4.5 05/22/2024 1518   CL 101 05/22/2024 1518   CO2 27 05/22/2024 1518   GLUCOSE 88 05/22/2024 1518   BUN 20 05/22/2024 1518   BUN 9 06/11/2018 1212   CREATININE 0.88 05/22/2024 1518   CALCIUM 9.4 05/22/2024 1518   PROT 8.8 (H) 05/22/2024 1518   PROT 8.0 06/11/2018 1212   ALBUMIN 3.8 03/16/2021 1417   ALBUMIN 3.8 06/11/2018 1212   AST 21 05/22/2024 1518   ALT 18 05/22/2024 1518   ALKPHOS 67 03/16/2021 1417   BILITOT 0.5 05/22/2024 1518   BILITOT 0.3 06/11/2018 1212   GFRNONAA >60 03/30/2021 0310   GFRNONAA 72 07/02/2020 1106   GFRAA 84 07/02/2020 1106    CBC    Component Value Date/Time   WBC 4.7 05/22/2024 1518   RBC 4.03 05/22/2024 1518   HGB 12.4 05/22/2024 1518   HGB 11.9 06/11/2018 1212   HCT 37.4 05/22/2024 1518   HCT 36.6 06/11/2018 1212   PLT 311 05/22/2024 1518   PLT 310 06/11/2018 1212   MCV 92.8 05/22/2024 1518   MCV 93 06/11/2018 1212   MCH 30.8 05/22/2024 1518   MCHC 33.2 05/22/2024 1518   RDW 13.8 05/22/2024 1518   RDW 14.4 06/11/2018 1212   LYMPHSABS 3,283 04/16/2023 1527   LYMPHSABS 2.3 06/11/2018 1212   MONOABS 0.5 02/28/2021 1614   EOSABS 61 05/22/2024 1518   EOSABS 0.2 06/11/2018 1212   BASOSABS 28 05/22/2024 1518   BASOSABS 0.0 06/11/2018 1212    Baseline Immunosuppressant Therapy Labs TB GOLD    Latest Ref Rng & Units 07/19/2023    3:09 PM  Quantiferon TB Gold  Quantiferon TB Gold Plus NEGATIVE NEGATIVE    Hepatitis Panel    Latest Ref Rng & Units 06/13/2019    2:46 PM  Hepatitis  Hep B Surface Ag NON-REACTI NON-REACTIVE   Hep B IgM NON-REACTI  NON-REACTIVE   Hep C Ab NON-REACTI NON-REACTIVE   Hep A IgM NON-REACTI NON-REACTIVE    HIV Lab Results  Component Value Date   HIV NON-REACTIVE 02/18/2020   Immunoglobulins    Latest Ref Rng & Units 02/18/2020    9:04 AM  Immunoglobulin Electrophoresis  IgA  47 - 310 mg/dL 348   IgG 399 - 8,359 mg/dL 7,781   IgM 50 - 699 mg/dL 78    SPEP    Latest Ref Rng & Units 05/22/2024    3:18 PM  Serum Protein Electrophoresis  Total Protein 6.1 - 8.1 g/dL 8.8    H3EI No results found for: G6PDH TPMT No results found for: TPMT   Chest x-ray: 01/27/2022 No acute cardiopulmonary process   Assessment/Plan:   Administrations This Visit     certolizumab pegol  (CIMZIA ) kit 400 mg     Admin Date 06/19/2024 Action Given Dose 400 mg Route Subcutaneous Documented By Burl Francina HERO, CMA             Patient tolerated injection well.   Appointment for next injection scheduled for July 17, 2024.  Patient due for TB Gold in October and labs in December.  Patient is  to call and reschedule appointment if running a fever with signs/symptoms of infection, on antibiotics for active infection or has an upcoming invasive procedure.  All questions encouraged and answered.  Instructed patient to call with any further questions or concerns.

## 2024-06-23 ENCOUNTER — Emergency Department (HOSPITAL_COMMUNITY)

## 2024-06-23 ENCOUNTER — Observation Stay (HOSPITAL_COMMUNITY)
Admission: EM | Admit: 2024-06-23 | Discharge: 2024-06-26 | Disposition: A | Discharge status: Home / Self Care | Source: Ambulatory Visit | Attending: Internal Medicine | Admitting: Internal Medicine

## 2024-06-23 ENCOUNTER — Other Ambulatory Visit: Payer: Self-pay

## 2024-06-23 ENCOUNTER — Encounter (HOSPITAL_COMMUNITY): Payer: Self-pay | Admitting: Emergency Medicine

## 2024-06-23 DIAGNOSIS — I160 Hypertensive urgency: Secondary | ICD-10-CM | POA: Insufficient documentation

## 2024-06-23 DIAGNOSIS — J90 Pleural effusion, not elsewhere classified: Principal | ICD-10-CM | POA: Insufficient documentation

## 2024-06-23 DIAGNOSIS — K449 Diaphragmatic hernia without obstruction or gangrene: Secondary | ICD-10-CM | POA: Insufficient documentation

## 2024-06-23 DIAGNOSIS — Z6841 Body Mass Index (BMI) 40.0 and over, adult: Secondary | ICD-10-CM | POA: Diagnosis not present

## 2024-06-23 DIAGNOSIS — M069 Rheumatoid arthritis, unspecified: Secondary | ICD-10-CM | POA: Diagnosis not present

## 2024-06-23 DIAGNOSIS — R0602 Shortness of breath: Secondary | ICD-10-CM | POA: Diagnosis present

## 2024-06-23 DIAGNOSIS — E66812 Obesity, class 2: Secondary | ICD-10-CM | POA: Diagnosis not present

## 2024-06-23 DIAGNOSIS — Z79899 Other long term (current) drug therapy: Secondary | ICD-10-CM | POA: Insufficient documentation

## 2024-06-23 DIAGNOSIS — F10920 Alcohol use, unspecified with intoxication, uncomplicated: Secondary | ICD-10-CM | POA: Diagnosis not present

## 2024-06-23 DIAGNOSIS — G4733 Obstructive sleep apnea (adult) (pediatric): Secondary | ICD-10-CM | POA: Insufficient documentation

## 2024-06-23 LAB — CBC WITH DIFFERENTIAL/PLATELET
Abs Immature Granulocytes: 0.01 K/uL (ref 0.00–0.07)
Basophils Absolute: 0 K/uL (ref 0.0–0.1)
Basophils Relative: 1 %
Eosinophils Absolute: 0.1 K/uL (ref 0.0–0.5)
Eosinophils Relative: 2 %
HCT: 39.4 % (ref 36.0–46.0)
Hemoglobin: 12.3 g/dL (ref 12.0–15.0)
Immature Granulocytes: 0 %
Lymphocytes Relative: 57 %
Lymphs Abs: 2.9 K/uL (ref 0.7–4.0)
MCH: 29.4 pg (ref 26.0–34.0)
MCHC: 31.2 g/dL (ref 30.0–36.0)
MCV: 94.3 fL (ref 80.0–100.0)
Monocytes Absolute: 0.8 K/uL (ref 0.1–1.0)
Monocytes Relative: 15 %
Neutro Abs: 1.3 K/uL — ABNORMAL LOW (ref 1.7–7.7)
Neutrophils Relative %: 25 %
Platelets: 334 K/uL (ref 150–400)
RBC: 4.18 MIL/uL (ref 3.87–5.11)
RDW: 13.9 % (ref 11.5–15.5)
WBC: 5.1 K/uL (ref 4.0–10.5)
nRBC: 0 % (ref 0.0–0.2)

## 2024-06-23 LAB — BASIC METABOLIC PANEL WITH GFR
Anion gap: 11 (ref 5–15)
BUN: 12 mg/dL (ref 6–20)
CO2: 25 mmol/L (ref 22–32)
Calcium: 9.3 mg/dL (ref 8.9–10.3)
Chloride: 103 mmol/L (ref 98–111)
Creatinine, Ser: 0.83 mg/dL (ref 0.44–1.00)
GFR, Estimated: >60 mL/min (ref >60)
Glucose, Bld: 100 mg/dL — ABNORMAL HIGH (ref 70–99)
Potassium: 4 mmol/L (ref 3.5–5.1)
Sodium: 140 mmol/L (ref 135–145)

## 2024-06-23 LAB — I-STAT CG4 LACTIC ACID, ED: Lactic Acid, Venous: 1.7 mmol/L (ref 0.5–1.9)

## 2024-06-23 MED ORDER — PROCHLORPERAZINE EDISYLATE 10 MG/2ML IJ SOLN: 5.0000 mg | Freq: Four times a day (QID) | INTRAMUSCULAR | Status: DC | PRN

## 2024-06-23 MED ORDER — ACETAMINOPHEN 325 MG PO TABS
650.0000 mg | ORAL_TABLET | Freq: Four times a day (QID) | ORAL | Status: DC | PRN
  Administered 2024-06-24: 650 mg via ORAL
  Filled 2024-06-23: qty 2

## 2024-06-23 MED ORDER — MELATONIN 5 MG PO TABS
5.0000 mg | ORAL_TABLET | Freq: Every evening | ORAL | Status: DC | PRN
Start: 2024-06-23 — End: 2024-06-26

## 2024-06-23 MED ORDER — IPRATROPIUM-ALBUTEROL 0.5-2.5 (3) MG/3ML IN SOLN
3.0000 mL | RESPIRATORY_TRACT | Status: AC
  Administered 2024-06-23: 3 mL via RESPIRATORY_TRACT
  Filled 2024-06-23: qty 3

## 2024-06-23 MED ORDER — IOHEXOL 300 MG/ML  SOLN
75.0000 mL | Freq: Once | INTRAMUSCULAR | Status: AC | PRN
  Administered 2024-06-23: 75 mL via INTRAVENOUS

## 2024-06-23 MED ORDER — HYDRALAZINE HCL 20 MG/ML IJ SOLN
10.0000 mg | Freq: Once | INTRAMUSCULAR | Status: AC
  Administered 2024-06-23: 10 mg via INTRAVENOUS
  Filled 2024-06-23: qty 1

## 2024-06-23 MED ORDER — POLYETHYLENE GLYCOL 3350 17 G PO PACK: 17.0000 g | PACK | Freq: Every day | ORAL | Status: DC | PRN

## 2024-06-23 MED ORDER — HEPARIN SODIUM (PORCINE) 5000 UNIT/ML IJ SOLN
5000.0000 [IU] | Freq: Three times a day (TID) | INTRAMUSCULAR | Status: DC
  Administered 2024-06-24 – 2024-06-26 (×7): 5000 [IU] via SUBCUTANEOUS
  Filled 2024-06-23 (×7): qty 1

## 2024-06-23 NOTE — ED Triage Notes (Signed)
 Patient coming to ED for evaluation of cough and shortness of breath.  Reports she was seen at Urgent Care for cough.  Has xray preformed and was sent here due to abnormal xray.  Pt reports he said one of my lungs is mostly full of fluid.  No report of fever.  Has non-productive cough.

## 2024-06-23 NOTE — ED Provider Notes (Signed)
 [place] EMERGENCY DEPARTMENT AT [facility] Provider Note   CSN: [id number] Arrival date & time: 06/23/24  1810     Patient presents with: Cough   Jennifer [name] is a 48 y.o. female past medical history significant for hypertension, GERD, rheumatoid arthritis, migraines who presents concern for cough, shortness of breath.  Seen urgent care, x-ray performed, large effusion noted to right lower lung all the way up to the level of mid lung.  No previous history of similar, does not smoke.  Reports that she has been feeling short of breath for around 1 week, with cough for just a few days.  No fever.  She reports no mucus with her cough.    Cough      Prior to Admission medications   Medication Sig Start Date End Date Taking? Authorizing Provider  amLODipine  (NORVASC ) 5 MG tablet Take 1.5 tablets (7.5 mg total) by mouth daily. 05/07/24  Yes Panosh, Apolinar POUR, MD  certolizumab pegol  (CIMZIA ) 2 X 200 MG KIT Inject 400 mg into the skin every 28 (twenty-eight) days.   Yes [provider]  cetirizine  (ZYRTEC ) 10 MG tablet Take 10 mg by mouth daily as needed for allergies.   Yes [provider]  clotrimazole-betamethasone (LOTRISONE) cream SMARTSIG:Topical Morning-Evening   Yes [provider]  diazepam  (VALIUM ) 5 MG tablet Take 2.5-5 mg by mouth at bedtime as needed for sedation.   Yes [provider]  Levonorgestrel (KYLEENA) 19.5 MG IUD 19.5 mg by Intrauterine route once.   Yes [provider]  NON FORMULARY Pt uses cpap nightly   Yes [provider]  prednisoLONE  acetate (PRED FORTE ) 1 % ophthalmic suspension Place 1 drop into the left eye 2 (two) times daily. As needed   Yes [provider]  SUMAtriptan  (TOSYMRA ) 10 MG/ACT SOLN Place 10 mg into the nose once as needed for up to 1 dose. May repeat after 1 hour.  Maximum 2 sprays in 24 hours. 10/17/22  Yes Jaffe, Adam R, DO  clindamycin-benzoyl peroxide (BENZACLIN) gel  Apply 1 Application topically as needed. Patient not taking: No sig reported 01/04/15   [provider]  ipratropium (ATROVENT ) 0.03 % nasal spray Place 2 sprays into both nostrils 2 (two) times daily. Patient not taking: Reported on 06/23/2024 09/23/22   Christopher Savannah, PA-C  nystatin -triamcinolone  ointment (MYCOLOG) Apply 1 Application topically as needed. Patient not taking: Reported on 06/23/2024 08/07/19   [provider]  eletriptan  (RELPAX ) 40 MG tablet Take 1 tablet (40 mg total) by mouth as needed for migraine or headache. May repeat in 2 hours if headache persists or recurs. 10/02/19 11/29/20  Panosh, Wanda K, MD    Allergies: Lisinopril  and Lisinopril -hydrochlorothiazide     Review of Systems  Respiratory:  Positive for cough.   All other systems reviewed and are negative.   Updated Vital Signs BP (!) 170/95   Pulse 89   Temp 98.5 F (36.9 C)   Resp 18   Ht 5' 4 (1.626 m)   Wt 122.5 kg   SpO2 98%   BMI 46.35 kg/m   Physical Exam Vitals and nursing note reviewed.  Constitutional:      General: She is not in acute distress.    Appearance: Normal appearance.  HENT:     Head: Normocephalic and atraumatic.  Eyes:     General:        Right eye: No discharge.        Left eye: No discharge.  Cardiovascular:     Rate and Rhythm: Normal rate and regular rhythm.     Heart sounds: No murmur heard.    No friction rub. No gallop.  Pulmonary:     Effort: Pulmonary effort is normal.     Breath sounds: Normal breath sounds.     Comments: Absent breath sounds in right lower lung field, occasional dry cough Abdominal:     General: Bowel sounds are normal.     Palpations: Abdomen is soft.  Skin:    General: Skin is warm and dry.     Capillary Refill: Capillary refill takes less than 2 seconds.  Neurological:     Mental Status: She is alert and oriented to person, place, and time.  Psychiatric:        Mood and Affect: Mood normal.        Behavior: Behavior  normal.     (all labs ordered are listed, but only abnormal results are displayed) Labs Reviewed  CBC WITH DIFFERENTIAL/PLATELET - Abnormal; Notable for the following components:      Result Value   Neutro Abs 1.3 (*)    All other components within normal limits  BASIC METABOLIC PANEL WITH GFR - Abnormal; Notable for the following components:   Glucose, Bld 100 (*)    All other components within normal limits  I-STAT CG4 LACTIC ACID, ED    EKG: EKG Interpretation Date/Time:  Monday June 23 2024 19:18:08 EDT Ventricular Rate:  93 PR Interval:  137 QRS Duration:  90 QT Interval:  361 QTC Calculation: 449 R Axis:   24  Text Interpretation: Sinus rhythm Abnormal R-wave progression, early transition Confirmed by Zackowski, Scott [phone]) on 06/23/2024 7:21:11 PM  Radiology: CT Chest W Contrast Result Date: 06/23/2024 CLINICAL DATA:  shob, large effusion noted on xray from urgent care EXAM: CT CHEST WITH CONTRAST TECHNIQUE: Multidetector CT imaging of the chest was performed during intravenous contrast administration. RADIATION DOSE REDUCTION: This exam was performed according to the departmental dose-optimization program which includes automated exposure control, adjustment of the mA and/or kV according to patient size and/or use of iterative reconstruction technique. CONTRAST:  75mL OMNIPAQUE  IOHEXOL  300 MG/ML  SOLN COMPARISON:  CT abdomen pelvis 12/07/2016. FINDINGS: Cardiovascular: Normal heart size. No significant pericardial effusion. The thoracic aorta is normal in caliber. No atherosclerotic plaque of the thoracic aorta. No coronary artery calcifications. Mediastinum/Nodes: Multiple enlarged mediastinal and right hilar lymph nodes. As an example a 2 cm right hilar and 1.7 cm right paratracheal lymph nodes. Enlarged precardiac lymph nodes measuring up to 1.1 cm (3:43). No enlarged left hilar or axillary lymph nodes. Thyroid  gland, trachea, and esophagus demonstrate no significant  findings. At least small volume hiatal hernia. Lungs/Pleura: Partial collapse of the right lower lobe and right middle lobe. No focal consolidation. No pulmonary nodule. No pulmonary mass. Moderate volume right pleural effusion. No left pleural effusion. No pneumothorax. Upper Abdomen: No acute abnormality. Musculoskeletal: No chest wall abnormality. No suspicious lytic or blastic osseous lesions. No acute displaced fracture. IMPRESSION: 1. Moderate volume right pleural effusion. Associated partial collapse of the right lower lobe and right middle lobe. Recommend thoracentesis with cytology for further evaluation of possible underlying malignancy. 2. Right hilar, mediastinal, pericardiac lymphadenopathy. Recommend attention on follow-up. 3. At least small volume hiatal hernia. Electronically Signed   By: Morgane  Naveau M.D.   On: 06/23/2024 20:41     Procedures   Medications Ordered in the ED  hydrALAZINE  (APRESOLINE ) injection 10 mg (10 mg Intravenous  Given 06/23/24 2005)  iohexol  (OMNIPAQUE ) 300 MG/ML solution 75 mL (75 mLs Intravenous Contrast Given 06/23/24 2017)                                    Medical Decision Making Amount and/or Complexity of Data Reviewed Labs: ordered. Radiology: ordered.  Risk Prescription drug management.   This patient is a 48 y.o. female  who presents to the ED for concern of shob.   Differential diagnoses prior to evaluation: The emergent differential diagnosis includes, but is not limited to,  asthma exacerbation, COPD exacerbation, acute upper respiratory infection, acute bronchitis, chronic bronchitis, interstitial lung disease, ARDS, PE, pneumonia, atypical ACS, carbon monoxide poisoning, spontaneous pneumothorax, new CHF vs CHF exacerbation, versus other . This is not an exhaustive differential.   Past Medical History / Co-morbidities / Social History: Hypertension, obesity, rheumatoid arthritis, GERD  Physical Exam: Physical exam performed. The  pertinent findings include: Patient with elevated blood pressure in the ED, 204/131 on arrival.  Improved to 170/95 after rest, hydralazine .  She has absent lung sounds on the right with some coarse rhonchi, no significant tachypnea, hypoxia but does endorse some subjective increased work of breathing.  Lab Tests/Imaging studies: I personally interpreted labs/imaging and the pertinent results include: CBC unremarkable, normal lactic acid, BMP unremarkable.  CT chest with contrast shows some hilar lymphadenopathy, right middle to lower lobe pleural effusion as well as collapse.  No obviously identified mass, but with recommendation for thoracentesis, cytology.I agree with the radiologist interpretation.  Cardiac monitoring: EKG obtained and interpreted by myself and attending physician which shows: normal sinus rhythm, abnormal R wave progression, no acute ST-T changes.   Medications: I ordered medication including hydralazine  for hypertension.  I have reviewed the patients home medicines and have made adjustments as needed.   Consults: I spoke with Dr. Vanice with interventional radiology who reports okay for inpatient thoracentesis and cytology in the morning if admitted, given patient's increased work of breathing, cough, rapid progression of symptoms I do think that she would benefit from hospital admission for thoracentesis, further workup, I spoke with Dr. Shona with hospital team who agrees to plan for admission  Disposition: After consideration of the diagnostic results and the patients response to treatment, I feel that patient would benefit from admission as discussed above .    Final diagnoses:  Pleural effusion    ED Discharge Orders     None          Jennifer [name] 06/23/24 2214    Geraldene Hamilton, MD 06/26/24 1859

## 2024-06-23 NOTE — H&P (Signed)
 History and Physical  TOMICKA LOVER FMW:[id number] DOB: [birth year] DOA: 06/23/2024  Referring physician: Lennice Handler, PA-EDP. PCP: Charlett Apolinar POUR, MD  Outpatient Specialists: None Patient coming from: Home.  6  Chief Complaint: Shortness of breath.  HPI: Jennifer [name] is a 48 y.o. female with medical history significant for rheumatoid arthritis, hypertension, severe morbid obesity, who presents to the ER with complaints of progressive shortness of breath for the past 1 week.  The patient is a never smoker.  Her dyspnea is associated with a nonproductive cough.  Denies having any fevers or chills.  No chest pain, GI, or GU symptoms.  In the ER, the patient is tachycardic and tachypneic with elevated blood pressures.  CT chest with contrast revealed moderate volume right pleural effusion.  Associated partial collapse of the right lower lobe and right middle lobe.  Recommend thoracentesis with cytology for further evaluation of possible underlying malignancy.  Right hilar, mediastinal, pericardiac lymphadenopathy.  Recommend attention on follow-up.  At least small volume hiatal hernia.  EDP requesting admission for IR thoracentesis and control of blood pressure.  The patient received IV hydralazine  for uncontrolled hypertension.   ED Course: Temperature 98.5.  BP 174/92, pulse 90, respiration rate 28, O2 saturation 97% on room air.  Review of Systems: Review of systems as noted in the HPI. All other systems reviewed and are negative.   Past Medical History:  Diagnosis Date   Acute otitis media 08/28/2012   improved  change to liquid medication    Anxiety disorder 03/29/2016   Breast abscess of female    Recurrent   Depressive disorder 03/29/2016   Family history of adverse reaction to anesthesia    father hard time waking once (12/20/2016)   Fibroids    w/bleeding   GERD (gastroesophageal reflux disease)    History of blood transfusion    after surgery   Hypertension     Migraines    Dr. Layman Meissner; I have a few/month (12/20/2016)   Osteoarthritis    Pill esophagitis 08/28/2012   amoxicillin   by hx  disc plan nl voice except hoarse not drooling  close fu  if not getting better with plan stop aleve   liquid ibu onlyf necessary    Recurrent periodic urticaria    Rheumatoid arthritis (HCC)    55% of my body (12/20/2016)   Rheumatoid arthritis (HCC)    Sleep apnea    wears cpap   Past Surgical History:  Procedure Laterality Date   BREAST SURGERY     abcess under left breast    CYST EXCISION Left 2014   elbow   HERNIA REPAIR     INCISION AND DRAINAGE BREAST ABSCESS Left 2003   INCISIONAL HERNIA REPAIR N/A 12/18/2016   Procedure: REPAIR INCISIONAL HERNIA WITH MESH;  Surgeon: Dann Hummer, MD;  Location: [facility];  Service: General;  Laterality: N/A;   INSERTION OF MESH N/A 12/18/2016   Procedure: INSERTION OF MESH;  Surgeon: Dann Hummer, MD;  Location: [facility] OR;  Service: General;  Laterality: N/A;   KNEE ARTHROSCOPY WITH MENISCAL REPAIR Right 06/17/2018   Procedure: RIGHT KNEE ARTHROSCOPY WITH MENISCAL REPAIR;  Surgeon: Rubie Kemps, MD;  Location: [facility] ORS;  Service: Orthopedics;  Laterality: Right;   MYOMECTOMY N/A 09/06/2016   Procedure: EZZIE MERL;  Surgeon: Cynthia Loss, MD;  Location: [facility] ORS;  Service: Gynecology;  Laterality: N/A;   ROOT CANAL  05/2022   TOTAL KNEE ARTHROPLASTY Right 03/28/2021   Procedure: TOTAL KNEE ARTHROPLASTY;  Surgeon:  Melodi Lerner, MD;  Location: [facility] ORS;  Service: Orthopedics;  Laterality: Right;    UTERINE FIBROID SURGERY     35 fibroids    Social History:  reports that she has never smoked. She has never been exposed to tobacco smoke. She has never used smokeless tobacco. She reports current alcohol use. She reports that she does not use drugs.   Allergies  Allergen Reactions   Lisinopril  Anaphylaxis   Lisinopril -Hydrochlorothiazide  Swelling and Other (See Comments)    Angioedema  Has  tolerated maxide in past so most likely  The ACE inhibitor as the cause     Family History  Problem Relation Age of Onset   Hypertension Mother    Rheum arthritis Mother    Hypertension Father    Sleep apnea Father    Breast cancer Neg Hx    Colon cancer Neg Hx    Colon polyps Neg Hx    Esophageal cancer Neg Hx    Rectal cancer Neg Hx    Stomach cancer Neg Hx       Prior to Admission medications   Medication Sig Start Date End Date Taking? Authorizing Provider  amLODipine  (NORVASC ) 5 MG tablet Take 1.5 tablets (7.5 mg total) by mouth daily. 05/07/24  Yes Panosh, Wanda K, MD  certolizumab pegol  (CIMZIA ) 2 X 200 MG KIT Inject 400 mg into the skin every 28 (twenty-eight) days.   Yes [provider]  cetirizine  (ZYRTEC ) 10 MG tablet Take 10 mg by mouth daily as needed for allergies.   Yes [provider]  clotrimazole-betamethasone (LOTRISONE) cream SMARTSIG:Topical Morning-Evening   Yes [provider]  diazepam  (VALIUM ) 5 MG tablet Take 2.5-5 mg by mouth at bedtime as needed for sedation.   Yes [provider]  Levonorgestrel (KYLEENA) 19.5 MG IUD 19.5 mg by Intrauterine route once.   Yes [provider]  NON FORMULARY Pt uses cpap nightly   Yes [provider]  prednisoLONE  acetate (PRED FORTE ) 1 % ophthalmic suspension Place 1 drop into the left eye 2 (two) times daily. As needed   Yes [provider]  SUMAtriptan  (TOSYMRA ) 10 MG/ACT SOLN Place 10 mg into the nose once as needed for up to 1 dose. May repeat after 1 hour.  Maximum 2 sprays in 24 hours. 10/17/22  Yes Jaffe, Adam R, DO  clindamycin-benzoyl peroxide (BENZACLIN) gel Apply 1 Application topically as needed. Patient not taking: No sig reported 01/04/15   [provider]  ipratropium (ATROVENT ) 0.03 % nasal spray Place 2 sprays into both nostrils 2 (two) times daily. Patient not taking: Reported on 06/23/2024 09/23/22   Christopher Savannah, PA-C   nystatin -triamcinolone  ointment (MYCOLOG) Apply 1 Application topically as needed. Patient not taking: Reported on 06/23/2024 08/07/19   [provider]  eletriptan  (RELPAX ) 40 MG tablet Take 1 tablet (40 mg total) by mouth as needed for migraine or headache. May repeat in 2 hours if headache persists or recurs. 10/02/19 11/29/20  Panosh, Apolinar POUR, MD    Physical Exam: BP (!) 170/95   Pulse 89   Temp 98.5 F (36.9 C)   Resp 18   Ht 5' 4 (1.626 m)   Wt 122.5 kg   SpO2 98%   BMI 46.35 kg/m   General: 48 y.o. year-old female well developed well nourished in no acute distress.  Alert and oriented x3. Cardiovascular: Regular rate and rhythm with no rubs or gallops.  No thyromegaly or JVD noted.  No lower extremity edema. 2/4  pulses in all 4 extremities. Respiratory: Diminished sounds on right lung.  Poor inspiratory effort. Abdomen: Soft nontender nondistended with normal bowel sounds x4 quadrants. Muskuloskeletal: No cyanosis, clubbing or edema noted bilaterally Neuro: CN II-XII intact, strength, sensation, reflexes Skin: No ulcerative lesions noted or rashes Psychiatry: Judgement and insight appear normal. Mood is appropriate for condition and setting          Labs on Admission:  Basic Metabolic Panel: Recent Labs  Lab 06/23/24 1925  NA 140  K 4.0  CL 103  CO2 25  GLUCOSE 100*  BUN 12  CREATININE 0.83  CALCIUM 9.3   Liver Function Tests: No results for input(s): AST, ALT, ALKPHOS, BILITOT, PROT, ALBUMIN in the last 168 hours. No results for input(s): LIPASE, AMYLASE in the last 168 hours. No results for input(s): AMMONIA in the last 168 hours. CBC: Recent Labs  Lab 06/23/24 1925  WBC 5.1  NEUTROABS 1.3*  HGB 12.3  HCT 39.4  MCV 94.3  PLT 334   Cardiac Enzymes: No results for input(s): CKTOTAL, CKMB, CKMBINDEX, TROPONINI in the last 168 hours.  BNP (last 3 results) No results for input(s): BNP in the last 8760  hours.  ProBNP (last 3 results) No results for input(s): PROBNP in the last 8760 hours.  CBG: No results for input(s): GLUCAP in the last 168 hours.  Radiological Exams on Admission: CT Chest W Contrast Result Date: 06/23/2024 CLINICAL DATA:  shob, large effusion noted on xray from urgent care EXAM: CT CHEST WITH CONTRAST TECHNIQUE: Multidetector CT imaging of the chest was performed during intravenous contrast administration. RADIATION DOSE REDUCTION: This exam was performed according to the departmental dose-optimization program which includes automated exposure control, adjustment of the mA and/or kV according to patient size and/or use of iterative reconstruction technique. CONTRAST:  75mL OMNIPAQUE  IOHEXOL  300 MG/ML  SOLN COMPARISON:  CT abdomen pelvis 12/07/2016. FINDINGS: Cardiovascular: Normal heart size. No significant pericardial effusion. The thoracic aorta is normal in caliber. No atherosclerotic plaque of the thoracic aorta. No coronary artery calcifications. Mediastinum/Nodes: Multiple enlarged mediastinal and right hilar lymph nodes. As an example a 2 cm right hilar and 1.7 cm right paratracheal lymph nodes. Enlarged precardiac lymph nodes measuring up to 1.1 cm (3:43). No enlarged left hilar or axillary lymph nodes. Thyroid  gland, trachea, and esophagus demonstrate no significant findings. At least small volume hiatal hernia. Lungs/Pleura: Partial collapse of the right lower lobe and right middle lobe. No focal consolidation. No pulmonary nodule. No pulmonary mass. Moderate volume right pleural effusion. No left pleural effusion. No pneumothorax. Upper Abdomen: No acute abnormality. Musculoskeletal: No chest wall abnormality. No suspicious lytic or blastic osseous lesions. No acute displaced fracture. IMPRESSION: 1. Moderate volume right pleural effusion. Associated partial collapse of the right lower lobe and right middle lobe. Recommend thoracentesis with cytology for further  evaluation of possible underlying malignancy. 2. Right hilar, mediastinal, pericardiac lymphadenopathy. Recommend attention on follow-up. 3. At least small volume hiatal hernia. Electronically Signed   By: Morgane  Naveau M.D.   On: 06/23/2024 20:41    EKG: I independently viewed the EKG done and my findings are as followed: Sinus rhythm rate of 93.  Nonspecific ST-T changes.  QTc 449.  Assessment/Plan Present on Admission:  Pleural effusion  Principal Problem:   Pleural effusion  Moderate right pleural effusion, unclear etiology Right thoracentesis therapeutic and diagnostic Follow body fluid analysis. IR consulted for thoracentesis Incentive spirometer Early mobilization  Hypertension, BP is not at goal, elevated Resume home amlodipine .  IV hydralazine  as needed with parameters. Closely monitor vital signs.  Severe morbid obesity BMI 46 Recommend weight loss outpatient with regular physical activity and healthy dieting.  Rheumatoid arthritis Resume home regimen once medications are reconciled. Follows with rheumatology outpatient.   Time: 75 minutes.   DVT prophylaxis: Subcu heparin 3 times daily  Code Status: Full code.  Family Communication: The patient's aunt is at bedside.  Disposition Plan: Admitted to telemetry unit.  Consults called: Interventional radiology for thoracentesis.  Admission status: Admission status.   Status is: Observation    Jennifer LOISE Hurst MD [facility] Pager [phone]  If 7PM-7AM, please contact night-coverage [email or web address] Password [email or web address]  06/23/2024, 10:39 PM

## 2024-06-24 ENCOUNTER — Observation Stay (HOSPITAL_COMMUNITY)

## 2024-06-24 DIAGNOSIS — M069 Rheumatoid arthritis, unspecified: Secondary | ICD-10-CM | POA: Diagnosis not present

## 2024-06-24 DIAGNOSIS — I160 Hypertensive urgency: Secondary | ICD-10-CM | POA: Diagnosis not present

## 2024-06-24 DIAGNOSIS — G4733 Obstructive sleep apnea (adult) (pediatric): Secondary | ICD-10-CM | POA: Diagnosis not present

## 2024-06-24 DIAGNOSIS — J90 Pleural effusion, not elsewhere classified: Secondary | ICD-10-CM | POA: Diagnosis not present

## 2024-06-24 HISTORY — PX: IR THORACENTESIS ASP PLEURAL SPACE W/IMG GUIDE: IMG5380

## 2024-06-24 LAB — HEPATIC FUNCTION PANEL
ALT: 18 U/L (ref 0–44)
AST: 25 U/L (ref 15–41)
Albumin: 3.7 g/dL (ref 3.5–5.0)
Alkaline Phosphatase: 92 U/L (ref 38–126)
Bilirubin, Direct: 0.1 mg/dL (ref 0.0–0.2)
Indirect Bilirubin: 0.5 mg/dL (ref 0.3–0.9)
Total Bilirubin: 0.6 mg/dL (ref 0.0–1.2)
Total Protein: 8.2 g/dL — ABNORMAL HIGH (ref 6.5–8.1)

## 2024-06-24 LAB — CBC
HCT: 36.6 % (ref 36.0–46.0)
Hemoglobin: 12 g/dL (ref 12.0–15.0)
MCH: 30.6 pg (ref 26.0–34.0)
MCHC: 32.8 g/dL (ref 30.0–36.0)
MCV: 93.4 fL (ref 80.0–100.0)
Platelets: 308 K/uL (ref 150–400)
RBC: 3.92 MIL/uL (ref 3.87–5.11)
RDW: 14.1 % (ref 11.5–15.5)
WBC: 4.3 K/uL (ref 4.0–10.5)
nRBC: 0 % (ref 0.0–0.2)

## 2024-06-24 LAB — BODY FLUID CELL COUNT WITH DIFFERENTIAL
Eos, Fluid: 2 %
Lymphs, Fluid: 78 %
Monocyte-Macrophage-Serous Fluid: 16 % — ABNORMAL LOW (ref 50–90)
Neutrophil Count, Fluid: 4 % (ref 0–25)
Total Nucleated Cell Count, Fluid: 7662 uL — ABNORMAL HIGH (ref 0–1000)

## 2024-06-24 LAB — BASIC METABOLIC PANEL WITH GFR
Anion gap: 11 (ref 5–15)
BUN: 10 mg/dL (ref 6–20)
CO2: 23 mmol/L (ref 22–32)
Calcium: 9 mg/dL (ref 8.9–10.3)
Chloride: 104 mmol/L (ref 98–111)
Creatinine, Ser: 0.8 mg/dL (ref 0.44–1.00)
GFR, Estimated: >60 mL/min (ref >60)
Glucose, Bld: 92 mg/dL (ref 70–99)
Potassium: 3.5 mmol/L (ref 3.5–5.1)
Sodium: 137 mmol/L (ref 135–145)

## 2024-06-24 LAB — TROPONIN T, HIGH SENSITIVITY
Troponin T High Sensitivity: <15 ng/L (ref 0–19)
Troponin T High Sensitivity: <15 ng/L (ref 0–19)

## 2024-06-24 LAB — HIV ANTIBODY (ROUTINE TESTING W REFLEX): HIV Screen 4th Generation wRfx: NONREACTIVE

## 2024-06-24 LAB — MAGNESIUM: Magnesium: 2 mg/dL (ref 1.7–2.4)

## 2024-06-24 LAB — PHOSPHORUS: Phosphorus: 3.6 mg/dL (ref 2.5–4.6)

## 2024-06-24 LAB — LACTATE DEHYDROGENASE: LDH: 218 U/L — ABNORMAL HIGH (ref 98–192)

## 2024-06-24 MED ORDER — GUAIFENESIN ER 600 MG PO TB12
1200.0000 mg | ORAL_TABLET | Freq: Two times a day (BID) | ORAL | Status: DC
  Administered 2024-06-24 – 2024-06-26 (×5): 1200 mg via ORAL
  Filled 2024-06-24 (×5): qty 2

## 2024-06-24 MED ORDER — LEVALBUTEROL HCL 0.63 MG/3ML IN NEBU
0.6300 mg | INHALATION_SOLUTION | Freq: Four times a day (QID) | RESPIRATORY_TRACT | Status: DC
  Administered 2024-06-24 – 2024-06-26 (×8): 0.63 mg via RESPIRATORY_TRACT
  Filled 2024-06-24 (×8): qty 3

## 2024-06-24 MED ORDER — PREDNISOLONE ACETATE 1 % OP SUSP
1.0000 [drp] | Freq: Two times a day (BID) | OPHTHALMIC | Status: DC
  Filled 2024-06-24: qty 5

## 2024-06-24 MED ORDER — LORATADINE 10 MG PO TABS
10.0000 mg | ORAL_TABLET | Freq: Every day | ORAL | Status: DC
  Administered 2024-06-25 – 2024-06-26 (×2): 10 mg via ORAL
  Filled 2024-06-24 (×3): qty 1

## 2024-06-24 MED ORDER — HYDRALAZINE HCL 20 MG/ML IJ SOLN: 5.0000 mg | Freq: Four times a day (QID) | INTRAMUSCULAR | Status: DC | PRN

## 2024-06-24 MED ORDER — DIAZEPAM 5 MG PO TABS
2.5000 mg | ORAL_TABLET | Freq: Every evening | ORAL | Status: DC | PRN
Start: 2024-06-24 — End: 2024-06-26

## 2024-06-24 MED ORDER — SODIUM CHLORIDE 0.9 % IV SOLN
2.0000 g | Freq: Every day | INTRAVENOUS | Status: DC
  Administered 2024-06-24 – 2024-06-26 (×3): 2 g via INTRAVENOUS
  Filled 2024-06-24 (×3): qty 20

## 2024-06-24 MED ORDER — OXYCODONE HCL 5 MG PO TABS
5.0000 mg | ORAL_TABLET | Freq: Four times a day (QID) | ORAL | Status: DC | PRN
  Administered 2024-06-24: 5 mg via ORAL
  Filled 2024-06-24: qty 1

## 2024-06-24 MED ORDER — AZITHROMYCIN 500 MG PO TABS
500.0000 mg | ORAL_TABLET | Freq: Every day | ORAL | Status: DC
  Administered 2024-06-24 – 2024-06-26 (×3): 500 mg via ORAL
  Filled 2024-06-24 (×3): qty 1

## 2024-06-24 MED ORDER — LIDOCAINE-EPINEPHRINE 1 %-1:100000 IJ SOLN
20.0000 mL | Freq: Once | INTRAMUSCULAR | Status: AC
  Administered 2024-06-24: 10 mL via INTRADERMAL
  Filled 2024-06-24: qty 20

## 2024-06-24 MED ORDER — FUROSEMIDE 10 MG/ML IJ SOLN
60.0000 mg | Freq: Once | INTRAMUSCULAR | Status: AC
Start: 2024-06-24 — End: 2024-06-24
  Administered 2024-06-24: 60 mg via INTRAVENOUS
  Filled 2024-06-24: qty 6

## 2024-06-24 MED ORDER — OXYCODONE HCL 5 MG PO TABS
5.0000 mg | ORAL_TABLET | ORAL | Status: DC | PRN
  Administered 2024-06-25 – 2024-06-26 (×3): 5 mg via ORAL
  Filled 2024-06-24 (×3): qty 1

## 2024-06-24 MED ORDER — MORPHINE SULFATE (PF) 2 MG/ML IV SOLN
1.0000 mg | Freq: Once | INTRAVENOUS | Status: AC | PRN
Start: 2024-06-24 — End: 2024-06-24
  Administered 2024-06-24: 1 mg via INTRAVENOUS
  Filled 2024-06-24: qty 1

## 2024-06-24 MED ORDER — IPRATROPIUM BROMIDE 0.02 % IN SOLN
0.5000 mg | Freq: Four times a day (QID) | RESPIRATORY_TRACT | Status: DC
  Administered 2024-06-24 – 2024-06-26 (×8): 0.5 mg via RESPIRATORY_TRACT
  Filled 2024-06-24 (×8): qty 2.5

## 2024-06-24 MED ORDER — AMLODIPINE BESYLATE 5 MG PO TABS
7.5000 mg | ORAL_TABLET | Freq: Every day | ORAL | Status: DC
  Administered 2024-06-24 – 2024-06-25 (×2): 7.5 mg via ORAL
  Filled 2024-06-24 (×2): qty 2

## 2024-06-24 MED ORDER — LIDOCAINE-EPINEPHRINE 1 %-1:100000 IJ SOLN
INTRAMUSCULAR | Status: AC
  Filled 2024-06-24: qty 1

## 2024-06-24 NOTE — Progress Notes (Signed)
 New orders received and EKG placed on chart. VSS

## 2024-06-24 NOTE — Progress Notes (Signed)
 PROGRESS NOTE    Jennifer [name]  FMW:[id number] DOB: 02-03-1976 DOA: 06/23/2024 PCP: Charlett Apolinar POUR, MD   Brief Narrative:  Jennifer [name] is a 48 y.o. female with medical history significant for rheumatoid arthritis, hypertension, morbid obesity, who presents to the ER with complaints of progressive shortness of breath for the past 1 week. CT chest with contrast revealed moderate volume right pleural effusion and associated partial collapse of the right lower lobe and right middle lobe.  Thoracentesis was obtained and current being worked up.   Assessment and Plan:  Moderate right pleural effusion, unclear etiology: Likely related to RA. S/p Right diagnostic and therapeutic thoracentesis by IR. Ordering Serum LDH and Protein and follow further Body fluid analysis. Total Nucleated Cell Count was 7662. Incentive Spirometry and Flutter Valve. Start Xopenex/Atrovent  q6h. Give a Dose of IV Lasix 60 mg x1. Check ECHOCardiogram to evaluate for CHF. Repeat CXR in the AM and will need an Ambulatory Home O2 Screen prior to D/C. Informally discussed with Pulmonary and recommendation is to await Further pleural studies to see whether this is Exudative or Transudative. -Repeat CXR after Thora showed Interval reduction in size of right-sided pleural effusion with a small residual effusion and moderate overlying atelectasis and  No postprocedural pneumothorax.    Hypertension, BP is not at goal, elevated: Resume home Amlodipine  7.5 mg po Daily. C/w IV Hydralazine  5 mg as needed if SBP >160. Getting a dose of IV Furosemide 60 mg x1. CTM BP per Protocol. Last BP reading was    Rheumatoid Arthritis: Holding Certolizumab Pegol  400 mg sq q28h and currently being held. Follows with rheumatology outpatient. C/w Acetaminophen  650 mg po q6hprn Mild Pain  OSA: C/w CPAP qHS  Class III (Morbid) Obesity: Complicates overall prognosis and care. Estimated body mass index is 46.35 kg/m as calculated from the following:    Height as of this encounter: 5' 4 (1.626 m).   Weight as of this encounter: 122.5 kg. Weight Loss and Dietary Counseling given   DVT prophylaxis: heparin injection 5,000 Units Start: 06/24/24 0600    Code Status: Full Code Family Communication: D/w family present @ bedside  Disposition Plan:  Level of care: Telemetry Status is: Observation The patient remains OBS appropriate and will d/c before 2 midnights.   Consultants:  IR  Procedures:  As delineated as above   Antimicrobials:  Anti-infectives (From admission, onward)    Start     Dose/Rate Route Frequency Ordered Stop   06/24/24 1730  azithromycin  (ZITHROMAX ) tablet 500 mg        500 mg Oral Daily 06/24/24 1641     06/24/24 1700  cefTRIAXone  (ROCEPHIN ) 2 g in sodium chloride  0.9 % 100 mL IVPB        2 g 200 mL/hr over 30 Minutes Intravenous Daily 06/24/24 1641         Subjective: Seen and examined at bedside and was still having a little chest discomfort and having a dry hacking cough.  No lightheadedness or dizziness.  No other concerns or complaint at this time.  Objective: Vitals:   06/24/24 1430 06/24/24 1530 06/24/24 1546 06/24/24 1648  BP: (!) 154/92 (!) 159/98  (!) 155/93  Pulse: 94 (!) 110  (!) 101  Resp: 16 17  20   Temp:   97.8 F (36.6 C) 98.3 F (36.8 C)  TempSrc:   Oral Oral  SpO2: 98% 98%  96%  Weight:      Height:  No intake or output data in the 24 hours ending 06/24/24 1725 Filed Weights   06/23/24 1827  Weight: 122.5 kg   Examination: Physical Exam:  Constitutional: WN/WD obese African-American female in no acute distress Respiratory: Diminished to auscultation bilaterally with some coarse breath sounds worse on the right with some crackles and mild rhonchi but no appreciable rales or wheezing. Normal respiratory effort and patient is not tachypenic. No accessory muscle use.  Unlabored breathing and not wearing supplemental oxygen nasal cannula Cardiovascular: Tachycardic rate but  regular rhythm, no murmurs / rubs / gallops. S1 and S2 auscultated.  Minimal extremity edema.  Abdomen: Soft, non-tender, distended secondary to body habitus. Bowel sounds positive.  GU: Deferred. Musculoskeletal: No clubbing / cyanosis of digits/nails. No joint deformity upper and lower extremities.  Skin: No rashes, lesions, ulcers on a limited skin evaluation. No induration; Warm and dry.  Neurologic: CN 2-12 grossly intact with no focal deficits. Romberg sign and cerebellar reflexes not assessed.  Psychiatric: Normal judgment and insight. Alert and oriented x 3. Normal mood and appropriate affect.   Data Reviewed: I have personally reviewed following labs and imaging studies  CBC: Recent Labs  Lab 06/23/24 1925 06/24/24 0452  WBC 5.1 4.3  NEUTROABS 1.3*  --   HGB 12.3 12.0  HCT 39.4 36.6  MCV 94.3 93.4  PLT 334 308   Basic Metabolic Panel: Recent Labs  Lab 06/23/24 1925 06/24/24 0452  NA 140 137  K 4.0 3.5  CL 103 104  CO2 25 23  GLUCOSE 100* 92  BUN 12 10  CREATININE 0.83 0.80  CALCIUM 9.3 9.0  MG  --  2.0  PHOS  --  3.6   GFR: Estimated Creatinine Clearance: 111.1 mL/min (by C-G formula based on SCr of 0.8 mg/dL). Liver Function Tests: No results for input(s): AST, ALT, ALKPHOS, BILITOT, PROT, ALBUMIN in the last 168 hours. No results for input(s): LIPASE, AMYLASE in the last 168 hours. No results for input(s): AMMONIA in the last 168 hours. Coagulation Profile: No results for input(s): INR, PROTIME in the last 168 hours. Cardiac Enzymes: No results for input(s): CKTOTAL, CKMB, CKMBINDEX, TROPONINI in the last 168 hours. BNP (last 3 results) No results for input(s): PROBNP in the last 8760 hours. HbA1C: No results for input(s): HGBA1C in the last 72 hours. CBG: No results for input(s): GLUCAP in the last 168 hours. Lipid Profile: No results for input(s): CHOL, HDL, LDLCALC, TRIG, CHOLHDL, LDLDIRECT in the  last 72 hours. Thyroid  Function Tests: No results for input(s): TSH, T4TOTAL, FREET4, T3FREE, THYROIDAB in the last 72 hours. Anemia Panel: No results for input(s): VITAMINB12, FOLATE, FERRITIN, TIBC, IRON , RETICCTPCT in the last 72 hours. Sepsis Labs: Recent Labs  Lab 06/23/24 1933  LATICACIDVEN 1.7   No results found for this or any previous visit (from the past 240 hours).   Radiology Studies: IR THORACENTESIS ASP PLEURAL SPACE W/IMG GUIDE Result Date: 06/24/2024 INDICATION: History hypertension, GERD, RA, migraine. Presented to the ED for cough and shortness of breath. Found to have a large right pleural effusion on chest x-ray. IR consulted for diagnostic and therapeutic right thoracentesis. EXAM: ULTRASOUND GUIDED DIAGNOSTIC AND THERAPEUTIC RIGHT THORACENTESIS MEDICATIONS: 9 mL 1% lidocaine  COMPLICATIONS: None immediate. PROCEDURE: An ultrasound guided thoracentesis was thoroughly discussed with the patient and questions answered. The benefits, risks, alternatives and complications were also discussed. The patient understands and wishes to proceed with the procedure. Written consent was obtained. Ultrasound was performed to localize and mark an adequate  pocket of fluid in the right chest. The area was then prepped and draped in the normal sterile fashion. 1% Lidocaine  was used for local anesthesia. Under ultrasound guidance a 6 Fr Safe-T-Centesis catheter was introduced. Thoracentesis was performed. The catheter was removed and a dressing applied. FINDINGS: A total of approximately 1.1 liters of straw colored fluid was removed. Samples were sent to the laboratory as requested by the clinical team. IMPRESSION: Successful ultrasound guided right thoracentesis yielding 1.1 liters of pleural fluid. Performed and dictated by Kimble Clas, PA-C Electronically Signed   By: Wilkie Lent M.D.   On: 06/24/2024 13:24   DG Chest Port 1 View Result Date: 06/24/2024 CLINICAL  DATA:  Status post right-sided thoracentesis. EXAM: PORTABLE CHEST 1 VIEW COMPARISON:  Chest CT 06/23/2024 FINDINGS: Interval reduction in size of the right-sided pleural effusion with a small residual effusion and moderate overlying atelectasis. No postprocedural pneumothorax is identified. IMPRESSION: 1. Interval reduction in size of right-sided pleural effusion with a small residual effusion and moderate overlying atelectasis. 2. No postprocedural pneumothorax. Electronically Signed   By: MYRTIS Stammer M.D.   On: 06/24/2024 11:45   CT Chest W Contrast Result Date: 06/23/2024 CLINICAL DATA:  shob, large effusion noted on xray from urgent care EXAM: CT CHEST WITH CONTRAST TECHNIQUE: Multidetector CT imaging of the chest was performed during intravenous contrast administration. RADIATION DOSE REDUCTION: This exam was performed according to the departmental dose-optimization program which includes automated exposure control, adjustment of the mA and/or kV according to patient size and/or use of iterative reconstruction technique. CONTRAST:  75mL OMNIPAQUE  IOHEXOL  300 MG/ML  SOLN COMPARISON:  CT abdomen pelvis 12/07/2016. FINDINGS: Cardiovascular: Normal heart size. No significant pericardial effusion. The thoracic aorta is normal in caliber. No atherosclerotic plaque of the thoracic aorta. No coronary artery calcifications. Mediastinum/Nodes: Multiple enlarged mediastinal and right hilar lymph nodes. As an example a 2 cm right hilar and 1.7 cm right paratracheal lymph nodes. Enlarged precardiac lymph nodes measuring up to 1.1 cm (3:43). No enlarged left hilar or axillary lymph nodes. Thyroid  gland, trachea, and esophagus demonstrate no significant findings. At least small volume hiatal hernia. Lungs/Pleura: Partial collapse of the right lower lobe and right middle lobe. No focal consolidation. No pulmonary nodule. No pulmonary mass. Moderate volume right pleural effusion. No left pleural effusion. No pneumothorax.  Upper Abdomen: No acute abnormality. Musculoskeletal: No chest wall abnormality. No suspicious lytic or blastic osseous lesions. No acute displaced fracture. IMPRESSION: 1. Moderate volume right pleural effusion. Associated partial collapse of the right lower lobe and right middle lobe. Recommend thoracentesis with cytology for further evaluation of possible underlying malignancy. 2. Right hilar, mediastinal, pericardiac lymphadenopathy. Recommend attention on follow-up. 3. At least small volume hiatal hernia. Electronically Signed   By: Morgane  Naveau M.D.   On: 06/23/2024 20:41   Scheduled Meds:  amLODipine   7.5 mg Oral Daily   azithromycin   500 mg Oral Daily   furosemide  60 mg Intravenous Once   guaiFENesin   1,200 mg Oral BID   heparin  5,000 Units Subcutaneous Q8H   ipratropium  0.5 mg Nebulization Q6H   levalbuterol  0.63 mg Nebulization Q6H   loratadine  10 mg Oral Daily   Continuous Infusions:  cefTRIAXone  (ROCEPHIN )  IV 2 g (06/24/24 1725)    LOS: 0 days   Alejandro Marker, DO [facility] Available via Epic secure chat 7am-7pm After these hours, please refer to coverage provider listed on [email or web address] 06/24/2024, 5:25 PM

## 2024-06-24 NOTE — ED Notes (Signed)
 Pt remains in IR for procedure, will obtain a new set of vitals when pt returns.

## 2024-06-24 NOTE — Progress Notes (Signed)
   06/24/24 2302  BiPAP/CPAP/SIPAP  BiPAP/CPAP/SIPAP Pt Type Adult  BiPAP/CPAP/SIPAP DREAMSTATIOND  Mask Type Full face mask  Dentures removed? Not applicable  Mask Size Medium  Respiratory Rate 18 breaths/min  Patient Home Machine No  Patient Home Mask No  Patient Home Tubing No  Auto Titrate Yes  Minimum cmH2O 5 cmH2O  Maximum cmH2O 20 cmH2O  Device Plugged into RED Power Outlet Yes

## 2024-06-24 NOTE — Progress Notes (Signed)
 Spoke with the patient regarding CPAP usage.  She stated that she was not ready to go to sleep yet.  Agreement made that she would have her RN contact RT when she is ready for bed.

## 2024-06-24 NOTE — Procedures (Signed)
 PROCEDURE SUMMARY:  Successful image-guided right thoracentesis. Yielded 1.1 liters of straw colored fluid. Patient tolerated procedure well. EBL: trace No immediate complications.  Specimen was sent for labs. Post procedure CXR ordered  Please see imaging section of Epic for full dictation.  Kimble DEL Mataeo Ingwersen PA-C 06/24/2024 10:28 AM

## 2024-06-24 NOTE — Progress Notes (Signed)
 Patient with c/o chest pain more to the right side. EKG being done and MD notified

## 2024-06-24 NOTE — Progress Notes (Signed)
   06/24/24 0115  BiPAP/CPAP/SIPAP  $ Non-Invasive Home Ventilator  Initial  $ Face Mask Medium Yes  BiPAP/CPAP/SIPAP Pt Type Adult  BiPAP/CPAP/SIPAP DREAMSTATIOND (all Resmed CPAP machines are currently in use)  Mask Type Full face mask  Dentures removed? Not applicable  Mask Size Medium  Respiratory Rate 19 breaths/min  FiO2 (%) 21 %  Patient Home Machine No  Patient Home Mask No  Patient Home Tubing No  Auto Titrate Yes  Minimum cmH2O 5 cmH2O  Maximum cmH2O 20 cmH2O  Device Plugged into RED Power Outlet Yes  BiPAP/CPAP /SiPAP Vitals  Pulse Rate 95  Resp 19  SpO2 98 %  MEWS Score/Color  MEWS Score 0  MEWS Score Color Jennifer [name]

## 2024-06-24 NOTE — ED Notes (Addendum)
 Ambulated patient to restroom. Urine sample sent to lab.

## 2024-06-24 NOTE — ED Notes (Signed)
 IR taking patient for procedure, pt will return to room once done.

## 2024-06-24 NOTE — Progress Notes (Deleted)
   06/24/24 2302  BiPAP/CPAP/SIPAP  BiPAP/CPAP/SIPAP Pt Type Adult  BiPAP/CPAP/SIPAP Resmed  Mask Type Full face mask  Dentures removed? Not applicable  Mask Size Medium  Respiratory Rate 18 breaths/min  IPAP 10 cmH20  EPAP 5 cmH2O  Patient Home Machine No  Patient Home Mask No  Patient Home Tubing No  Auto Titrate No  Device Plugged into RED Power Outlet Yes

## 2024-06-24 NOTE — Hospital Course (Addendum)
 Jennifer [name] is a 48 y.o. female with medical history significant for rheumatoid arthritis, hypertension, morbid obesity, who presents to the ER with complaints of progressive shortness of breath for the past 1 week. CT chest with contrast revealed moderate volume right pleural effusion and associated partial collapse of the right lower lobe and right middle lobe.  Thoracentesis was obtained and current being worked up.   Assessment and Plan:  Moderate right pleural effusion, unclear etiology: Likely related to RA. S/p Right diagnostic and therapeutic thoracentesis by IR. Ordering Serum LDH and Protein and follow further Body fluid analysis. Total Nucleated Cell Count was 7662. Incentive Spirometry and Flutter Valve. Start Xopenex/Atrovent  q6h. Give a Dose of IV Lasix 60 mg x1. Check ECHOCardiogram to evaluate for CHF. Repeat CXR in the AM and will need an Ambulatory Home O2 Screen prior to D/C. Informally discussed with Pulmonary and recommendation is to await Further pleural studies to see whether this is Exudative or Transudative. -Repeat CXR after Thora showed Interval reduction in size of right-sided pleural effusion with a small residual effusion and moderate overlying atelectasis and  No postprocedural pneumothorax.    Hypertension, BP is not at goal, elevated: Resume home Amlodipine  7.5 mg po Daily. C/w IV Hydralazine  5 mg as needed if SBP >160. Getting a dose of IV Furosemide 60 mg x1. CTM BP per Protocol. Last BP reading was    Rheumatoid Arthritis: Holding Certolizumab Pegol  400 mg sq q28h and currently being held. Follows with rheumatology outpatient. C/w Acetaminophen  650 mg po q6hprn Mild Pain  OSA: C/w CPAP qHS  Class III (Morbid) Obesity: Complicates overall prognosis and care. Estimated body mass index is 46.35 kg/m as calculated from the following:   Height as of this encounter: 5' 4 (1.626 m).   Weight as of this encounter: 122.5 kg. Weight Loss and Dietary Counseling given

## 2024-06-25 ENCOUNTER — Observation Stay (HOSPITAL_COMMUNITY)

## 2024-06-25 DIAGNOSIS — R0609 Other forms of dyspnea: Secondary | ICD-10-CM

## 2024-06-25 DIAGNOSIS — J90 Pleural effusion, not elsewhere classified: Secondary | ICD-10-CM | POA: Diagnosis not present

## 2024-06-25 LAB — ECHOCARDIOGRAM COMPLETE
AR max vel: 3.28 cm2
AV Area VTI: 3.44 cm2
AV Area mean vel: 3.07 cm2
AV Mean grad: 4 mmHg
AV Peak grad: 8.6 mmHg
Ao pk vel: 1.47 m/s
Area-P 1/2: 3.65 cm2
Calc EF: 64.7 %
Height: 64 in
MV VTI: 4.33 cm2
S' Lateral: 2.9 cm
Single Plane A2C EF: 64.5 %
Single Plane A4C EF: 62.8 %
Weight: 4320 [oz_av]

## 2024-06-25 LAB — PHOSPHORUS: Phosphorus: 4.3 mg/dL (ref 2.5–4.6)

## 2024-06-25 LAB — CBC WITH DIFFERENTIAL/PLATELET
Abs Immature Granulocytes: 0.02 K/uL (ref 0.00–0.07)
Basophils Absolute: 0 K/uL (ref 0.0–0.1)
Basophils Relative: 0 %
Eosinophils Absolute: 0 K/uL (ref 0.0–0.5)
Eosinophils Relative: 0 %
HCT: 36.9 % (ref 36.0–46.0)
Hemoglobin: 11.3 g/dL — ABNORMAL LOW (ref 12.0–15.0)
Immature Granulocytes: 0 %
Lymphocytes Relative: 51 %
Lymphs Abs: 2.7 K/uL (ref 0.7–4.0)
MCH: 29.7 pg (ref 26.0–34.0)
MCHC: 30.6 g/dL (ref 30.0–36.0)
MCV: 96.9 fL (ref 80.0–100.0)
Monocytes Absolute: 0.6 K/uL (ref 0.1–1.0)
Monocytes Relative: 11 %
Neutro Abs: 2 K/uL (ref 1.7–7.7)
Neutrophils Relative %: 38 %
Platelets: 316 K/uL (ref 150–400)
RBC: 3.81 MIL/uL — ABNORMAL LOW (ref 3.87–5.11)
RDW: 14.3 % (ref 11.5–15.5)
WBC: 5.4 K/uL (ref 4.0–10.5)
nRBC: 0 % (ref 0.0–0.2)

## 2024-06-25 LAB — ACID FAST SMEAR (AFB, MYCOBACTERIA): Acid Fast Smear: NEGATIVE

## 2024-06-25 LAB — COMPREHENSIVE METABOLIC PANEL WITH GFR
ALT: 11 U/L (ref 0–44)
AST: 27 U/L (ref 15–41)
Albumin: 3.5 g/dL (ref 3.5–5.0)
Alkaline Phosphatase: 84 U/L (ref 38–126)
Anion gap: 13 (ref 5–15)
BUN: 13 mg/dL (ref 6–20)
CO2: 19 mmol/L — ABNORMAL LOW (ref 22–32)
Calcium: 9.2 mg/dL (ref 8.9–10.3)
Chloride: 101 mmol/L (ref 98–111)
Creatinine, Ser: 1.07 mg/dL — ABNORMAL HIGH (ref 0.44–1.00)
GFR, Estimated: >60 mL/min (ref >60)
Glucose, Bld: 97 mg/dL (ref 70–99)
Potassium: 3.9 mmol/L (ref 3.5–5.1)
Sodium: 134 mmol/L — ABNORMAL LOW (ref 135–145)
Total Bilirubin: 0.5 mg/dL (ref 0.0–1.2)
Total Protein: 8.6 g/dL — ABNORMAL HIGH (ref 6.5–8.1)

## 2024-06-25 LAB — TSH: TSH: 1.04 u[IU]/mL (ref 0.350–4.500)

## 2024-06-25 LAB — MAGNESIUM: Magnesium: 2.3 mg/dL (ref 1.7–2.4)

## 2024-06-25 MED ORDER — AMLODIPINE BESYLATE 10 MG PO TABS
10.0000 mg | ORAL_TABLET | Freq: Every day | ORAL | Status: DC
  Administered 2024-06-26: 10 mg via ORAL
  Filled 2024-06-25: qty 1

## 2024-06-25 MED ORDER — FUROSEMIDE 10 MG/ML IJ SOLN
60.0000 mg | Freq: Once | INTRAMUSCULAR | Status: AC
  Administered 2024-06-25: 60 mg via INTRAVENOUS
  Filled 2024-06-25: qty 6

## 2024-06-25 NOTE — Plan of Care (Signed)
  Problem: Education: Goal: Knowledge of General Education information will improve Description: Including pain rating scale, medication(s)/side effects and non-pharmacologic comfort measures Outcome: Progressing   Problem: Health Behavior/Discharge Planning: Goal: Ability to manage health-related needs will improve Outcome: Progressing   Problem: Clinical Measurements: Goal: Ability to maintain clinical measurements within normal limits will improve Outcome: Progressing Goal: Respiratory complications will improve Outcome: Progressing   Problem: Activity: Goal: Risk for activity intolerance will decrease Outcome: Progressing   Problem: Nutrition: Goal: Adequate nutrition will be maintained Outcome: Progressing   Problem: Elimination: Goal: Will not experience complications related to bowel motility Outcome: Progressing Goal: Will not experience complications related to urinary retention Outcome: Progressing   Problem: Pain Managment: Goal: General experience of comfort will improve and/or be controlled Outcome: Progressing   Problem: Safety: Goal: Ability to remain free from injury will improve Outcome: Progressing   Problem: Skin Integrity: Goal: Risk for impaired skin integrity will decrease Outcome: Progressing

## 2024-06-25 NOTE — Progress Notes (Signed)
 Pt ambulated in hallway on RA. O2 stayed in between 93-97% entire time ambulating/no shortness of breath reported during ambulation. Jennifer [name] Jennifer [name]

## 2024-06-25 NOTE — Progress Notes (Signed)
   06/25/24 2240  BiPAP/CPAP/SIPAP  BiPAP/CPAP/SIPAP Pt Type Adult  BiPAP/CPAP/SIPAP DREAMSTATIOND  Mask Type Full face mask  Dentures removed? Not applicable  Mask Size Medium  FiO2 (%) 21 %  Patient Home Machine No  Patient Home Mask No  Patient Home Tubing No  Auto Titrate Yes  Minimum cmH2O 5 cmH2O  Maximum cmH2O 20 cmH2O  Device Plugged into RED Power Outlet Yes

## 2024-06-25 NOTE — Progress Notes (Signed)
 PROGRESS NOTE    Jennifer [name]  FMW:[id number] DOB: May 04, 1976 DOA: 06/23/2024 PCP: Charlett Apolinar POUR, MD    Brief Narrative:   Jennifer [name] is a 48 y.o. female with past medical history significant for rheumatoid arthritis, HTN, OSA on CPAP, morbid obesity who presented to [facility] ED on 06/23/2024 with progressive shortness of breath and cough.  Onset 1 week prior.  Denies fever/chills, no chest pain.  In the ED, temperature 98.3 F, HR 98, RR 16, BP 204/131, SpO2 94% on room air.  WC 5.1, hemoglobin 12.3, platelet count 334.  Sodium 140, potassium 4.0, chloride 103, CO2 25, glucose 100, BUN 12, creatinine 0.83.  Lactic acid 1.7.  CT chest without contrast with moderate volume right pleural effusion associate with partial collapse of right lower lobe and right middle lobe, right hilar/mediastinal/pericardiac lymphadenopathy, small volume hiatal hernia.  Received IV hydralazine  in the ED.  EDP consulted [facility] for admission for further evaluation and management of pleural effusion, blood pressure control and planned IR thoracentesis.  Assessment & Plan:   Right pleural effusion likely secondary to rheumatoid arthritis Patient presenting with 1 week history of progressive shortness of breath, nonproductive cough.  Patient is afebrile without leukocytosis.  Lactic acid within normal limits.  Oxygenating well on room air.  CT chest without contrast with moderate volume right pleural effusion with associated right lower lobe atelectasis.  IR was consulted and patient underwent ultrasound-guided thoracentesis on 10/7 with 1.1 L fluid removed.  Serum LDH 218, total protein 8.2. -- Pleural fluid cell count with 7662 nucleated cells, 4 neutrophils -- Pleural fluid culture with no WBCs/organisms seen, no growth <24h -- Pleural fluid LDH, total protein: Pending -- Repeat Lasix 60 mg IV x 1 today -- Repeat chest x-ray in a.m. -- BMP in a.m. -- referral to outpatient pulmonology for further surveillance; if  recurrence may need intrapleural steroids versus consideration for pleurodesis  Hypertensive urgency Patient presenting with a blood pressure 204/131.  At baseline on amlodipine  7.5 mg p.o. daily. -- BP 149/101 -- Increase amlodipine  to 10 mg p.o. daily -- continue to monitor BP  Rheumatoid arthritis Follows with rheumatology outpatient, Dr. Dolphus. On [facility] outpatient  Insomnia -- Valium  PRN qHS  Morbid obesity, class III Body mass index is 46.35 kg/m.   OSA -- Continue nocturnal CPAP   DVT prophylaxis: heparin injection 5,000 Units Start: 06/24/24 0600    Code Status: Full Code Family Communication: No family present at bedside this morning  Disposition Plan:  Level of care: Telemetry Status is: Observation The patient remains OBS appropriate and will d/c before 2 midnights.    Consultants:  Interventional radiology  Procedures:  Ultrasound-guided thoracentesis, 10/7  Antimicrobials:  Azithromycin  10/7>> Ceftriaxone  10/7>>   Subjective: Patient seen examined bedside, lying in bed.  Dyspnea improved following thoracentesis yesterday.  Awaiting on pleural fluid labs to return.  Remains on azithromycin  and ceftriaxone .  No other specific complaints, questions, concerns at this time.  Denies headache, no dizziness, no chest pain, no palpitations, no current shortness of breath, no abdominal pain, no fever/chills/night sweats, no nausea/vomiting/diarrhea, no focal weakness, no fatigue, no paresthesia.  No acute events overnight per nursing staff.  Objective: Vitals:   06/25/24 0746 06/25/24 0750 06/25/24 0933 06/25/24 1224  BP:   (!) 144/94 (!) 150/95  Pulse:   85 85  Resp:   12 20  Temp:   97.9 F (36.6 C) 97.6 F (36.4 C)  TempSrc:   Oral Oral  SpO2: 95%  95% 96% 97%  Weight:      Height:        Intake/Output Summary (Last 24 hours) at 06/25/2024 1301 Last data filed at 06/25/2024 0600 Gross per 24 hour  Intake 293.21 ml  Output 325 ml  Net -31.79 ml    Filed Weights   06/23/24 1827  Weight: 122.5 kg    Examination:  Physical Exam: GEN: NAD, alert and oriented x 3, obese HEENT: NCAT, PERRL, EOMI, sclera clear, MMM PULM: Breath sounds slight diminished right base, no wheezes/crackles, normal respiratory effort, on room air CV: RRR w/o M/G/R GI: abd soft, NTND, + BS MSK: no peripheral edema, muscle strength globally intact 5/5 bilateral upper/lower extremities NEURO: No focal neurological deficit PSYCH: normal mood/affect Integumentary: No concerning rashes/lesions/wounds noted on exposed skin surfaces    Data Reviewed: I have personally reviewed following labs and imaging studies  CBC: Recent Labs  Lab 06/23/24 1925 06/24/24 0452 06/25/24 0529  WBC 5.1 4.3 5.4  NEUTROABS 1.3*  --  2.0  HGB 12.3 12.0 11.3*  HCT 39.4 36.6 36.9  MCV 94.3 93.4 96.9  PLT 334 308 316   Basic Metabolic Panel: Recent Labs  Lab 06/23/24 1925 06/24/24 0452 06/25/24 0529  NA 140 137 134*  K 4.0 3.5 3.9  CL 103 104 101  CO2 25 23 19*  GLUCOSE 100* 92 97  BUN 12 10 13   CREATININE 0.83 0.80 1.07*  CALCIUM 9.3 9.0 9.2  MG  --  2.0 2.3  PHOS  --  3.6 4.3   GFR: Estimated Creatinine Clearance: 82.1 mL/min (A) (by C-G formula based on SCr of 1.07 mg/dL (H)). Liver Function Tests: Recent Labs  Lab 06/24/24 0452 06/25/24 0529  AST 25 27  ALT 18 11  ALKPHOS 92 84  BILITOT 0.6 0.5  PROT 8.2* 8.6*  ALBUMIN 3.7 3.5   No results for input(s): LIPASE, AMYLASE in the last 168 hours. No results for input(s): AMMONIA in the last 168 hours. Coagulation Profile: No results for input(s): INR, PROTIME in the last 168 hours. Cardiac Enzymes: No results for input(s): CKTOTAL, CKMB, CKMBINDEX, TROPONINI in the last 168 hours. BNP (last 3 results) No results for input(s): PROBNP in the last 8760 hours. HbA1C: No results for input(s): HGBA1C in the last 72 hours. CBG: No results for input(s): GLUCAP in the last 168  hours. Lipid Profile: No results for input(s): CHOL, HDL, LDLCALC, TRIG, CHOLHDL, LDLDIRECT in the last 72 hours. Thyroid  Function Tests: Recent Labs    06/25/24 0529  TSH 1.040   Anemia Panel: No results for input(s): VITAMINB12, FOLATE, FERRITIN, TIBC, IRON , RETICCTPCT in the last 72 hours. Sepsis Labs: Recent Labs  Lab 06/23/24 1933  LATICACIDVEN 1.7    Recent Results (from the past 240 hours)  Body fluid culture w Gram Stain     Status: None (Preliminary result)   Collection Time: 06/24/24 10:35 AM   Specimen: Lung, Right; Pleural Fluid  Result Value Ref Range Status   Specimen Description   Final    PLEURAL RIGHT LUNG Performed at [facility], [address], [place], [place] [address]    Special Requests   Final    NONE Performed at [facility] [address] [place], [place] [address]    Gram Stain NO WBC SEEN NO ORGANISMS SEEN   Final   Culture   Final    NO GROWTH < 24 HOURS Performed at [facility], [address]  [place], [place] [address]    Report Status PENDING  Incomplete         Radiology Studies: DG CHEST PORT 1 VIEW Result Date: 06/25/2024 CLINICAL DATA:  Shortness of breath.  Pleural effusion. EXAM: PORTABLE CHEST 1 VIEW COMPARISON:  06/24/2024 FINDINGS: Small to moderate right pleural effusion and right lower lung atelectasis or infiltrate show no significant change. Left lung remains clear. Heart size is stable. IMPRESSION: No significant change in small to moderate right pleural effusion, and right lower lung atelectasis or infiltrate. Electronically Signed   By: Norleen DELENA Kil M.D.   On: 06/25/2024 06:41   IR THORACENTESIS ASP PLEURAL SPACE W/IMG GUIDE Result Date: 06/24/2024 INDICATION: History hypertension, GERD, RA, migraine. Presented to the ED for cough and shortness of breath. Found to have a large right pleural effusion on chest x-ray. IR consulted for diagnostic and  therapeutic right thoracentesis. EXAM: ULTRASOUND GUIDED DIAGNOSTIC AND THERAPEUTIC RIGHT THORACENTESIS MEDICATIONS: 9 mL 1% lidocaine  COMPLICATIONS: None immediate. PROCEDURE: An ultrasound guided thoracentesis was thoroughly discussed with the patient and questions answered. The benefits, risks, alternatives and complications were also discussed. The patient understands and wishes to proceed with the procedure. Written consent was obtained. Ultrasound was performed to localize and mark an adequate pocket of fluid in the right chest. The area was then prepped and draped in the normal sterile fashion. 1% Lidocaine  was used for local anesthesia. Under ultrasound guidance a 6 Fr Safe-T-Centesis catheter was introduced. Thoracentesis was performed. The catheter was removed and a dressing applied. FINDINGS: A total of approximately 1.1 liters of straw colored fluid was removed. Samples were sent to the laboratory as requested by the clinical team. IMPRESSION: Successful ultrasound guided right thoracentesis yielding 1.1 liters of pleural fluid. Performed and dictated by Kimble Clas, PA-C Electronically Signed   By: Wilkie Lent M.D.   On: 06/24/2024 13:24   DG Chest Port 1 View Result Date: 06/24/2024 CLINICAL DATA:  Status post right-sided thoracentesis. EXAM: PORTABLE CHEST 1 VIEW COMPARISON:  Chest CT 06/23/2024 FINDINGS: Interval reduction in size of the right-sided pleural effusion with a small residual effusion and moderate overlying atelectasis. No postprocedural pneumothorax is identified. IMPRESSION: 1. Interval reduction in size of right-sided pleural effusion with a small residual effusion and moderate overlying atelectasis. 2. No postprocedural pneumothorax. Electronically Signed   By: MYRTIS Stammer M.D.   On: 06/24/2024 11:45   CT Chest W Contrast Result Date: 06/23/2024 CLINICAL DATA:  shob, large effusion noted on xray from urgent care EXAM: CT CHEST WITH CONTRAST TECHNIQUE: Multidetector CT  imaging of the chest was performed during intravenous contrast administration. RADIATION DOSE REDUCTION: This exam was performed according to the departmental dose-optimization program which includes automated exposure control, adjustment of the mA and/or kV according to patient size and/or use of iterative reconstruction technique. CONTRAST:  75mL OMNIPAQUE  IOHEXOL  300 MG/ML  SOLN COMPARISON:  CT abdomen pelvis 12/07/2016. FINDINGS: Cardiovascular: Normal heart size. No significant pericardial effusion. The thoracic aorta is normal in caliber. No atherosclerotic plaque of the thoracic aorta. No coronary artery calcifications. Mediastinum/Nodes: Multiple enlarged mediastinal and right hilar lymph nodes. As an example a 2 cm right hilar and 1.7 cm right paratracheal lymph nodes. Enlarged precardiac lymph nodes measuring up to 1.1 cm (3:43). No enlarged left hilar or axillary lymph nodes. Thyroid  gland, trachea, and esophagus demonstrate no significant findings. At least small volume hiatal hernia. Lungs/Pleura: Partial collapse of the right lower lobe and right middle lobe. No focal consolidation. No pulmonary nodule. No pulmonary  mass. Moderate volume right pleural effusion. No left pleural effusion. No pneumothorax. Upper Abdomen: No acute abnormality. Musculoskeletal: No chest wall abnormality. No suspicious lytic or blastic osseous lesions. No acute displaced fracture. IMPRESSION: 1. Moderate volume right pleural effusion. Associated partial collapse of the right lower lobe and right middle lobe. Recommend thoracentesis with cytology for further evaluation of possible underlying malignancy. 2. Right hilar, mediastinal, pericardiac lymphadenopathy. Recommend attention on follow-up. 3. At least small volume hiatal hernia. Electronically Signed   By: Morgane  Naveau M.D.   On: 06/23/2024 20:41        Scheduled Meds:  amLODipine   7.5 mg Oral Daily   azithromycin   500 mg Oral Daily   guaiFENesin   1,200 mg  Oral BID   heparin  5,000 Units Subcutaneous Q8H   ipratropium  0.5 mg Nebulization Q6H   levalbuterol  0.63 mg Nebulization Q6H   loratadine  10 mg Oral Daily   Continuous Infusions:  cefTRIAXone  (ROCEPHIN )  IV Stopped (06/25/24 1057)     LOS: 0 days    Time spent: 52 minutes spent on 06/25/2024 caring for this patient face-to-face including chart review, ordering labs/tests, documenting, discussion with nursing staff, consultants, updating family and interview/physical exam    Camellia PARAS [place], DO [facility] Available via Epic secure chat 7am-7pm After these hours, please refer to coverage provider listed on [email or web address] 06/25/2024, 1:01 PM

## 2024-06-26 ENCOUNTER — Observation Stay (HOSPITAL_COMMUNITY)

## 2024-06-26 ENCOUNTER — Other Ambulatory Visit (HOSPITAL_COMMUNITY): Payer: Self-pay

## 2024-06-26 DIAGNOSIS — J90 Pleural effusion, not elsewhere classified: Secondary | ICD-10-CM | POA: Diagnosis not present

## 2024-06-26 LAB — BASIC METABOLIC PANEL WITH GFR
Anion gap: 10 (ref 5–15)
BUN: 16 mg/dL (ref 6–20)
CO2: 24 mmol/L (ref 22–32)
Calcium: 9 mg/dL (ref 8.9–10.3)
Chloride: 101 mmol/L (ref 98–111)
Creatinine, Ser: 1.02 mg/dL — ABNORMAL HIGH (ref 0.44–1.00)
GFR, Estimated: >60 mL/min (ref >60)
Glucose, Bld: 98 mg/dL (ref 70–99)
Potassium: 3.5 mmol/L (ref 3.5–5.1)
Sodium: 135 mmol/L (ref 135–145)

## 2024-06-26 MED ORDER — HYDROCODONE BIT-HOMATROP MBR 5-1.5 MG/5ML PO SOLN: 5.0000 mL | Freq: Four times a day (QID) | ORAL | 0 refills | Status: DC | PRN

## 2024-06-26 MED ORDER — HYDROCODONE BIT-HOMATROP MBR 5-1.5 MG/5ML PO SOLN
5.0000 mL | Freq: Four times a day (QID) | ORAL | 0 refills | Status: AC | PRN
  Filled 2024-06-26: qty 120, 6d supply, fill #0

## 2024-06-26 MED ORDER — IPRATROPIUM BROMIDE 0.02 % IN SOLN
0.5000 mg | Freq: Two times a day (BID) | RESPIRATORY_TRACT | Status: DC
Start: 2024-06-27 — End: 2024-06-26

## 2024-06-26 MED ORDER — AMLODIPINE BESYLATE 10 MG PO TABS
10.0000 mg | ORAL_TABLET | Freq: Every day | ORAL | 0 refills | Status: DC
  Filled 2024-06-26: qty 90, 90d supply, fill #0

## 2024-06-26 MED ORDER — FUROSEMIDE 40 MG PO TABS: 40.0000 mg | ORAL_TABLET | Freq: Two times a day (BID) | ORAL | 0 refills | Status: DC

## 2024-06-26 MED ORDER — AMLODIPINE BESYLATE 10 MG PO TABS: 10.0000 mg | ORAL_TABLET | Freq: Every day | ORAL | 0 refills | Status: DC

## 2024-06-26 MED ORDER — FUROSEMIDE 40 MG PO TABS
40.0000 mg | ORAL_TABLET | Freq: Two times a day (BID) | ORAL | 0 refills | Status: DC
  Filled 2024-06-26: qty 30, 15d supply, fill #0

## 2024-06-26 MED ORDER — PREDNISONE 10 MG PO TABS
ORAL_TABLET | ORAL | 0 refills | Status: AC
  Filled 2024-06-26: qty 18, 15d supply, fill #0

## 2024-06-26 MED ORDER — PREDNISONE 10 MG PO TABS: ORAL_TABLET | ORAL | 0 refills | Status: DC

## 2024-06-26 MED ORDER — FUROSEMIDE 40 MG PO TABS
40.0000 mg | ORAL_TABLET | Freq: Two times a day (BID) | ORAL | Status: DC
  Administered 2024-06-26: 40 mg via ORAL
  Filled 2024-06-26: qty 1

## 2024-06-26 MED ORDER — LEVALBUTEROL HCL 0.63 MG/3ML IN NEBU: 0.6300 mg | INHALATION_SOLUTION | Freq: Two times a day (BID) | RESPIRATORY_TRACT | Status: DC

## 2024-06-26 MED ORDER — POTASSIUM CHLORIDE CRYS ER 20 MEQ PO TBCR
40.0000 meq | EXTENDED_RELEASE_TABLET | Freq: Once | ORAL | Status: AC
  Administered 2024-06-26: 40 meq via ORAL
  Filled 2024-06-26: qty 2

## 2024-06-26 NOTE — TOC Transition Note (Signed)
 Transition of Care [facility]) - Discharge Note   Patient Details  Name: Jennifer [name] MRN: [id number] Date of Birth: 1975/11/25  [facility] of [facility] CM/SW Contact:  Bascom Service, RN Phone Number: 06/26/2024, 12:47 PM   Clinical Narrative: d/c home no CM needs.      Final next level of care: Home/Self Care Barriers to Discharge: No Barriers Identified   Patient Goals and CMS Choice            Discharge Placement                       Discharge Plan and Services Additional resources added to the After Visit Summary for                                       Social Drivers of Health (SDOH) Interventions SDOH Screenings   Food Insecurity: No Food Insecurity (06/24/2024)  Housing: Low Risk  (06/24/2024)  Transportation Needs: No Transportation Needs (06/24/2024)  Utilities: Not At Risk (06/24/2024)  Alcohol Screen: Low Risk  (06/20/2023)  Depression (PHQ2-9): Low Risk  (06/20/2023)  Financial Resource Strain: Low Risk  (06/20/2023)  Physical Activity: Inactive (06/20/2023)  Social Connections: Moderately Integrated (06/20/2023)  Stress: Stress Concern Present (06/20/2023)  Tobacco Use: Low Risk  (06/23/2024)     Readmission Risk Interventions     No data to display

## 2024-06-26 NOTE — Progress Notes (Signed)
 Discharge medications delivered to patient at the bedside in a secure bag.

## 2024-06-26 NOTE — Discharge Summary (Signed)
 Physician Discharge Summary  Jennifer [name] FMW:[id number] DOB: Sep 07, 1976 DOA: 06/23/2024  PCP: Jennifer Apolinar POUR, MD  Admit date: 06/23/2024 Discharge date: 06/26/2024  Admitted From: Home Disposition: Home  Recommendations for Outpatient Follow-up:  Follow up with PCP in 1-2 weeks Outpatient follow-up with rheumatology 1-2 weeks Referral placed to pulmonology for follow-up regarding pleural effusion Amlodipine  increased to 10 mg p.o. daily for poorly controlled hypertension Started on Lasix 40 g p.o. twice daily x 3 days for pleural effusion Prednisone  taper for pleural effusion likely related to rheumatoid arthritis Please obtain BMP in one week Recommend repeat chest x-ray 1-2 weeks If recurrence regarding her pleural effusion may need pleurodesis  Home Health: No Equipment/Devices: None  Discharge Condition: Stable CODE STATUS: Full code Diet recommendation: Heart healthy diet  History of present illness:  racy Jennifer [name] is a 48 y.o. female with past medical history significant for rheumatoid arthritis, HTN, OSA on CPAP, morbid obesity who presented to [facility] ED on 06/23/2024 with progressive shortness of breath and cough.  Onset 1 week prior.  Denies fever/chills, no chest pain.   In the ED, temperature 98.3 F, HR 98, RR 16, BP 204/131, SpO2 94% on room air.  WC 5.1, hemoglobin 12.3, platelet count 334.  Sodium 140, potassium 4.0, chloride 103, CO2 25, glucose 100, BUN 12, creatinine 0.83.  Lactic acid 1.7.  CT chest without contrast with moderate volume right pleural effusion associate with partial collapse of right lower lobe and right middle lobe, right hilar/mediastinal/pericardiac lymphadenopathy, small volume hiatal hernia.  Received IV hydralazine  in the ED.  EDP consulted [facility] for admission for further evaluation and management of pleural effusion, blood pressure control and planned IR thoracentesis.  Hospital course:  Right pleural effusion likely secondary to rheumatoid  arthritis Patient presenting with 1 week history of progressive shortness of breath, nonproductive cough.  Patient is afebrile without leukocytosis.  Lactic acid within normal limits.  Oxygenating well on room air.  CT chest without contrast with moderate volume right pleural effusion with associated right lower lobe atelectasis.  IR was consulted and patient underwent ultrasound-guided thoracentesis on 10/7 with 1.1 Jennifer fluid removed.  Serum LDH 218, total protein 8.2. Pleural fluid cell count with 7662 nucleated cells, 4 neutrophils. Pleural fluid culture with no WBCs/organisms seen, no growth <24h.  Initially started on azithromycin  and ceftriaxone , now discontinued as pleural effusion likely related to rheumatoid arthritis and not infectious etiology.  Started on Lasix 40 mams p.o. twice daily x 30 days and prednisone  taper.  Titrated off of supplemental oxygen.  Needs close outpatient follow-up with rheumatology. Referral to outpatient pulmonology for further surveillance; if recurrence may need consideration for pleurodesis   Hypertensive urgency Patient presenting with a blood pressure 204/131.  At baseline on amlodipine  7.5 mg p.o. daily.  Blood pressure remained elevated during hospitalization amlodipine  was increased to 10 mg p.o. daily.  Outpatient follow-up with PCP.   Rheumatoid arthritis Follows with rheumatology outpatient, Jennifer [name]. On CIMZA outpatient   Insomnia Valium  PRN qHS   Morbid obesity, class III Body mass index is 46.35 kg/m.    OSA Continue nocturnal CPAP  Discharge Diagnoses:  Principal Problem:   Pleural effusion    Discharge Instructions  Discharge Instructions     Call MD for:  difficulty breathing, headache or visual disturbances   Complete by: As directed    Call MD for:  extreme fatigue   Complete by: As directed    Call MD for:  persistant dizziness or light-headedness  Complete by: As directed    Call MD for:  persistant nausea and vomiting    Complete by: As directed    Call MD for:  severe uncontrolled pain   Complete by: As directed    Call MD for:  temperature >100.4   Complete by: As directed    Diet - low sodium heart healthy   Complete by: As directed    Increase activity slowly   Complete by: As directed    Pulmonary Visit   Complete by: As directed    Pleural effusion likely 2/2 RA   Reason for referral: Other Pulmonary      Allergies as of 06/26/2024       Reactions   Lisinopril  Anaphylaxis   Lisinopril -hydrochlorothiazide  Swelling, Other (See Comments)   Angioedema  Has tolerated maxide in past so most likely  The ACE inhibitor as the cause         Medication List     STOP taking these medications    clindamycin-benzoyl peroxide gel Commonly known as: BENZACLIN   ipratropium 0.03 % nasal spray Commonly known as: ATROVENT    nystatin -triamcinolone  ointment Commonly known as: MYCOLOG       TAKE these medications    amLODipine  10 MG tablet Commonly known as: NORVASC  Take 1 tablet (10 mg total) by mouth daily. Start taking on: June 27, 2024 What changed:  medication strength how much to take   cetirizine  10 MG tablet Commonly known as: ZYRTEC  Take 10 mg by mouth daily as needed for allergies.   Cimzia  2 X 200 MG Kit Generic drug: certolizumab pegol  Inject 400 mg into the skin every 28 (twenty-eight) days.   clotrimazole-betamethasone cream Commonly known as: LOTRISONE SMARTSIG:Topical Morning-Evening   diazepam  5 MG tablet Commonly known as: VALIUM  Take 2.5-5 mg by mouth at bedtime as needed for sedation.   furosemide 40 MG tablet Commonly known as: LASIX Take 1 tablet (40 mg total) by mouth 2 (two) times daily.   HYDROcodone  bit-homatropine 5-1.5 MG/5ML syrup Commonly known as: HYCODAN Take 5 mLs by mouth every 6 (six) hours as needed for cough.   Kyleena 19.5 MG IUD Generic drug: levonorgestrel 19.5 mg by Intrauterine route once.   NON FORMULARY Pt uses cpap  nightly   prednisoLONE  acetate 1 % ophthalmic suspension Commonly known as: PRED FORTE  Place 1 drop into the left eye 2 (two) times daily. As needed   predniSONE  10 MG tablet Commonly known as: DELTASONE  Take 2 tablets (20 mg total) by mouth daily for 5 days, THEN 1 tablet (10 mg total) daily for 5 days, THEN 0.5 tablets (5 mg total) daily for 5 days. Start taking on: June 26, 2024   Tosymra  10 MG/ACT Soln Generic drug: SUMAtriptan  Place 10 mg into the nose once as needed for up to 1 dose. May repeat after 1 hour.  Maximum 2 sprays in 24 hours.        Follow-up Information     Panosh, Wanda K, MD. Schedule an appointment as soon as possible for a visit in 1 week(s).   Specialties: Internal Medicine, Pediatrics Contact information: [address] [place] [address] [phone]         [name] Reiter, MD. Schedule an appointment as soon as possible for a visit.   Specialty: Rheumatology Contact information: [address] [place] [address] [place] [address] [phone]         [facility] [place]. Schedule an appointment as soon as possible for  a visit.   Specialty: Pulmonology Why: Pleural effusion likely related to rheumatoid arthritis Contact information: [address] [place]  [address] [phone] Additional information: [address]  [address]  [place], [place] [address]               Allergies  Allergen Reactions   Lisinopril  Anaphylaxis   Lisinopril -Hydrochlorothiazide  Swelling and Other (See Comments)    Angioedema  Has tolerated maxide in past so most likely  The ACE inhibitor as the cause     Consultations: None   Procedures/Studies: ECHOCARDIOGRAM COMPLETE Result Date: 06/25/2024    ECHOCARDIOGRAM REPORT   Patient Name:   GABRIELLE WAKELAND Date of Exam: 06/25/2024 Medical Rec #:  [id number]      Height:       64.0 in Accession #:    [id number]     Weight:        270.0 lb Date of Birth:  07-02-1976      BSA:          2.223 m Patient Age:    48 years       BP:           149/101 mmHg Patient Gender: F              HR:           76 bpm. Exam Location:  Inpatient Procedure: 2D Echo, Color Doppler and Cardiac Doppler (Both Spectral and Color            Flow Doppler were utilized during procedure). Indications:    Dyspnea R06.00  History:        Patient has no prior history of Echocardiogram examinations.                 Arrythmias:Tachycardia, Signs/Symptoms:Shortness of Breath; Risk                 Factors:Hypertension and Sleep Apnea. H/O Pleural effusion.  Sonographer:    BERNARDA ROCKS Referring Phys: [id number] ALEJANDRO LATIF SHEIKH IMPRESSIONS  1. Left ventricular ejection fraction, by estimation, is 60 to 65%. The left ventricle has normal function. The left ventricle has no regional wall motion abnormalities. Left ventricular diastolic parameters were normal.  2. Right ventricular systolic function is normal. The right ventricular size is normal. Tricuspid regurgitation signal is inadequate for assessing PA pressure.  3. Left atrial size was mildly dilated.  4. The mitral valve is normal in structure. No evidence of mitral valve regurgitation. No evidence of mitral stenosis.  5. The aortic valve is normal in structure. Aortic valve regurgitation is not visualized. No aortic stenosis is present.  6. The inferior vena cava is dilated in size with <50% respiratory variability, suggesting right atrial pressure of 15 mmHg. Comparison(s): No prior Echocardiogram. Conclusion(s)/Recommendation(s): Otherwise normal echocardiogram, with minor abnormalities described in the report. FINDINGS  Left Ventricle: Left ventricular ejection fraction, by estimation, is 60 to 65%. The left ventricle has normal function. The left ventricle has no regional wall motion abnormalities. The left ventricular internal cavity size was normal in size. There is  no left ventricular hypertrophy. Left  ventricular diastolic parameters were normal. Right Ventricle: The right ventricular size is normal. No increase in right ventricular wall thickness. Right ventricular systolic function is normal. Tricuspid regurgitation signal is inadequate for assessing PA pressure. Left Atrium: Left atrial size was mildly dilated. Right Atrium: Right atrial size was normal in size. Pericardium: There is no evidence of pericardial effusion.  Mitral Valve: The mitral valve is normal in structure. No evidence of mitral valve regurgitation. No evidence of mitral valve stenosis. MV peak gradient, 2.7 mmHg. The mean mitral valve gradient is 1.0 mmHg. Tricuspid Valve: The tricuspid valve is normal in structure. Tricuspid valve regurgitation is trivial. No evidence of tricuspid stenosis. Aortic Valve: The aortic valve is normal in structure. Aortic valve regurgitation is not visualized. No aortic stenosis is present. Aortic valve mean gradient measures 4.0 mmHg. Aortic valve peak gradient measures 8.6 mmHg. Aortic valve area, by VTI measures 3.44 cm. Pulmonic Valve: The pulmonic valve was normal in structure. Pulmonic valve regurgitation is trivial. No evidence of pulmonic stenosis. Aorta: The aortic root and ascending aorta are structurally normal, with no evidence of dilitation. Venous: The inferior vena cava is dilated in size with less than 50% respiratory variability, suggesting right atrial pressure of 15 mmHg. IAS/Shunts: No atrial level shunt detected by color flow Doppler.  LEFT VENTRICLE PLAX 2D LVIDd:         4.60 cm      Diastology LVIDs:         2.90 cm      LV e' medial:    9.90 cm/s LV PW:         0.80 cm      LV E/e' medial:  6.6 LV IVS:        1.00 cm      LV e' lateral:   14.40 cm/s LVOT diam:     2.20 cm      LV E/e' lateral: 4.6 LV SV:         93 LV SV Index:   42 LVOT Area:     3.80 cm  LV Volumes (MOD) LV vol d, MOD A2C: 102.0 ml LV vol d, MOD A4C: 134.0 ml LV vol s, MOD A2C: 36.2 ml LV vol s, MOD A4C: 49.8 ml LV  SV MOD A2C:     65.8 ml LV SV MOD A4C:     134.0 ml LV SV MOD BP:      76.6 ml RIGHT VENTRICLE             IVC RV Basal diam:  3.30 cm     IVC diam: 2.10 cm RV S prime:     15.30 cm/s TAPSE (M-mode): 2.3 cm LEFT ATRIUM             Index        RIGHT ATRIUM           Index LA diam:        2.60 cm 1.17 cm/m   RA Area:     15.00 cm LA Vol (A2C):   52.6 ml 23.67 ml/m  RA Volume:   41.40 ml  18.63 ml/m LA Vol (A4C):   53.2 ml 23.94 ml/m LA Biplane Vol: 53.5 ml 24.07 ml/m  AORTIC VALVE                    PULMONIC VALVE AV Area (Vmax):    3.28 cm     PV Vmax:          1.01 m/s AV Area (Vmean):   3.07 cm     PV Peak grad:     4.1 mmHg AV Area (VTI):     3.44 cm     PR End Diast Vel: 5.15 msec AV Vmax:           147.00 cm/s AV Vmean:  95.800 cm/s AV VTI:            0.271 m AV Peak Grad:      8.6 mmHg AV Mean Grad:      4.0 mmHg LVOT Vmax:         127.00 cm/s LVOT Vmean:        77.300 cm/s LVOT VTI:          0.245 m LVOT/AV VTI ratio: 0.90  AORTA Ao Root diam: 3.00 cm Ao Asc diam:  3.30 cm MITRAL VALVE MV Area (PHT): 3.65 cm    SHUNTS MV Area VTI:   4.33 cm    Systemic VTI:  0.24 m MV Peak grad:  2.7 mmHg    Systemic Diam: 2.20 cm MV Mean grad:  1.0 mmHg MV Vmax:       0.82 m/s MV Vmean:      54.6 cm/s MV Decel Time: 208 msec MV E velocity: 65.60 cm/s MV A velocity: 70.00 cm/s MV E/A ratio:  0.94 Emeline Calender Electronically signed by Emeline Calender Signature Date/Time: 06/25/2024/2:45:26 PM    Final    DG CHEST PORT 1 VIEW Result Date: 06/25/2024 CLINICAL DATA:  Shortness of breath.  Pleural effusion. EXAM: PORTABLE CHEST 1 VIEW COMPARISON:  06/24/2024 FINDINGS: Small to moderate right pleural effusion and right lower lung atelectasis or infiltrate show no significant change. Left lung remains clear. Heart size is stable. IMPRESSION: No significant change in small to moderate right pleural effusion, and right lower lung atelectasis or infiltrate. Electronically Signed   By: Norleen DELENA Kil M.D.   On: 06/25/2024  06:41   IR THORACENTESIS ASP PLEURAL SPACE W/IMG GUIDE Result Date: 06/24/2024 INDICATION: History hypertension, GERD, RA, migraine. Presented to the ED for cough and shortness of breath. Found to have a large right pleural effusion on chest x-ray. IR consulted for diagnostic and therapeutic right thoracentesis. EXAM: ULTRASOUND GUIDED DIAGNOSTIC AND THERAPEUTIC RIGHT THORACENTESIS MEDICATIONS: 9 mL 1% lidocaine  COMPLICATIONS: None immediate. PROCEDURE: An ultrasound guided thoracentesis was thoroughly discussed with the patient and questions answered. The benefits, risks, alternatives and complications were also discussed. The patient understands and wishes to proceed with the procedure. Written consent was obtained. Ultrasound was performed to localize and mark an adequate pocket of fluid in the right chest. The area was then prepped and draped in the normal sterile fashion. 1% Lidocaine  was used for local anesthesia. Under ultrasound guidance a 6 Fr Safe-T-Centesis catheter was introduced. Thoracentesis was performed. The catheter was removed and a dressing applied. FINDINGS: A total of approximately 1.1 liters of straw colored fluid was removed. Samples were sent to the laboratory as requested by the clinical team. IMPRESSION: Successful ultrasound guided right thoracentesis yielding 1.1 liters of pleural fluid. Performed and dictated by Kimble Clas, PA-C Electronically Signed   By: Wilkie Lent M.D.   On: 06/24/2024 13:24   DG Chest Port 1 View Result Date: 06/24/2024 CLINICAL DATA:  Status post right-sided thoracentesis. EXAM: PORTABLE CHEST 1 VIEW COMPARISON:  Chest CT 06/23/2024 FINDINGS: Interval reduction in size of the right-sided pleural effusion with a small residual effusion and moderate overlying atelectasis. No postprocedural pneumothorax is identified. IMPRESSION: 1. Interval reduction in size of right-sided pleural effusion with a small residual effusion and moderate overlying  atelectasis. 2. No postprocedural pneumothorax. Electronically Signed   By: MYRTIS Stammer M.D.   On: 06/24/2024 11:45   CT Chest W Contrast Result Date: 06/23/2024 CLINICAL DATA:  shob, large effusion noted on xray from urgent care  EXAM: CT CHEST WITH CONTRAST TECHNIQUE: Multidetector CT imaging of the chest was performed during intravenous contrast administration. RADIATION DOSE REDUCTION: This exam was performed according to the departmental dose-optimization program which includes automated exposure control, adjustment of the mA and/or kV according to patient size and/or use of iterative reconstruction technique. CONTRAST:  75mL OMNIPAQUE  IOHEXOL  300 MG/ML  SOLN COMPARISON:  CT abdomen pelvis 12/07/2016. FINDINGS: Cardiovascular: Normal heart size. No significant pericardial effusion. The thoracic aorta is normal in caliber. No atherosclerotic plaque of the thoracic aorta. No coronary artery calcifications. Mediastinum/Nodes: Multiple enlarged mediastinal and right hilar lymph nodes. As an example a 2 cm right hilar and 1.7 cm right paratracheal lymph nodes. Enlarged precardiac lymph nodes measuring up to 1.1 cm (3:43). No enlarged left hilar or axillary lymph nodes. Thyroid  gland, trachea, and esophagus demonstrate no significant findings. At least small volume hiatal hernia. Lungs/Pleura: Partial collapse of the right lower lobe and right middle lobe. No focal consolidation. No pulmonary nodule. No pulmonary mass. Moderate volume right pleural effusion. No left pleural effusion. No pneumothorax. Upper Abdomen: No acute abnormality. Musculoskeletal: No chest wall abnormality. No suspicious lytic or blastic osseous lesions. No acute displaced fracture. IMPRESSION: 1. Moderate volume right pleural effusion. Associated partial collapse of the right lower lobe and right middle lobe. Recommend thoracentesis with cytology for further evaluation of possible underlying malignancy. 2. Right hilar, mediastinal,  pericardiac lymphadenopathy. Recommend attention on follow-up. 3. At least small volume hiatal hernia. Electronically Signed   By: Morgane  Naveau M.D.   On: 06/23/2024 20:41     Subjective: Patient seen examined bedside, lying in bed.  Remains off of supplemental oxygen.  Dyspnea improved.  Discussed with patient initiation of Lasix, prednisone  taper on discharge and needs close follow-up with rheumatology as this is likely related to her underlying rheumatoid arthritis.  Also outpatient referral placed to pulmonology for further evaluation and management.  No other questions or concerns at this time.  Denies headache, no dizziness, no chest pain, no palpitations, no shortness of breath, no abdominal pain, no fever/chills/night sweats, no nausea/vomiting/diarrhea, no focal weakness, no fatigue, no paresthesia.  No acute events overnight per nursing staff.  Discharge Exam: Vitals:   06/26/24 0419 06/26/24 0830  BP: 139/89   Pulse: (!) 103   Resp: 16   Temp: 98 F (36.7 C)   SpO2: 95% 96%   Vitals:   06/26/24 0243 06/26/24 0245 06/26/24 0419 06/26/24 0830  BP:   139/89   Pulse:   (!) 103   Resp:   16   Temp:   98 F (36.7 C)   TempSrc:      SpO2: 96% 96% 95% 96%  Weight:      Height:        Physical Exam: GEN: NAD, alert and oriented x 3, obese HEENT: NCAT, PERRL, EOMI, sclera clear, MMM PULM: Breath sounds slight diminished right base, no wheezes/crackles, normal respiratory effort, on room air with SpO2 96% at rest CV: RRR w/o M/G/R GI: abd soft, NTND, + BS MSK: no peripheral edema, muscle strength globally intact 5/5 bilateral upper/lower extremities NEURO: No focal neurological deficit PSYCH: normal mood/affect Integumentary: No concerning rashes/lesions/wounds noted on exposed skin surfaces    The results of significant diagnostics from this hospitalization (including imaging, microbiology, ancillary and laboratory) are listed below for reference.      Microbiology: Recent Results (from the past 240 hours)  Acid Fast Smear (AFB)     Status: None   Collection Time:  06/24/24 10:35 AM   Specimen: Lung, Right; Pleural Fluid  Result Value Ref Range Status   AFB Specimen Processing Concentration  Final   Acid Fast Smear Negative  Final    Comment: (NOTE) Performed At: [facility] [address] [place], [place] [id number] Jennette Shorter MD Ey:[id number]    Source (AFB) PLEURAL  Final    Comment: RIGHT LUNG Performed at [facility], [address], [place], [place] [address]   Body fluid culture w Gram Stain     Status: None (Preliminary result)   Collection Time: 06/24/24 10:35 AM   Specimen: Lung, Right; Pleural Fluid  Result Value Ref Range Status   Specimen Description   Final    PLEURAL RIGHT LUNG Performed at [facility], [address] [place], [place] [address]    Special Requests   Final    NONE Performed at [facility], [address] [place], [place] [address]    Gram Stain NO WBC SEEN NO ORGANISMS SEEN   Final   Culture   Final    NO GROWTH 2 DAYS Performed at [facility], [address], [place], [place] [address]    Report Status PENDING  Incomplete     Labs: BNP (last 3 results) No results for input(s): BNP in the last 8760 hours. Basic Metabolic Panel: Recent Labs  Lab 06/23/24 1925 06/24/24 0452 06/25/24 0529 06/26/24 0524  NA 140 137 134* 135  K 4.0 3.5 3.9 3.5  CL 103 104 101 101  CO2 25 23 19* 24  GLUCOSE 100* 92 97 98  BUN 12 10 13 16   CREATININE 0.83 0.80 1.07* 1.02*  CALCIUM 9.3 9.0 9.2 9.0  MG  --  2.0 2.3  --   PHOS  --  3.6 4.3  --    Liver Function Tests: Recent Labs  Lab 06/24/24 0452 06/25/24 0529  AST 25 27  ALT 18 11  ALKPHOS 92 84  BILITOT 0.6 0.5  PROT 8.2* 8.6*  ALBUMIN 3.7 3.5   No results for input(s): LIPASE, AMYLASE in the last 168 hours. No results for input(s): AMMONIA  in the last 168 hours. CBC: Recent Labs  Lab 06/23/24 1925 06/24/24 0452 06/25/24 0529  WBC 5.1 4.3 5.4  NEUTROABS 1.3*  --  2.0  HGB 12.3 12.0 11.3*  HCT 39.4 36.6 36.9  MCV 94.3 93.4 96.9  PLT 334 308 316   Cardiac Enzymes: No results for input(s): CKTOTAL, CKMB, CKMBINDEX, TROPONINI in the last 168 hours. BNP: Invalid input(s): POCBNP CBG: No results for input(s): GLUCAP in the last 168 hours. D-Dimer No results for input(s): DDIMER in the last 72 hours. Hgb A1c No results for input(s): HGBA1C in the last 72 hours. Lipid Profile No results for input(s): CHOL, HDL, LDLCALC, TRIG, CHOLHDL, LDLDIRECT in the last 72 hours. Thyroid  function studies Recent Labs    06/25/24 0529  TSH 1.040   Anemia work up No results for input(s): VITAMINB12, FOLATE, FERRITIN, TIBC, IRON , RETICCTPCT in the last 72 hours. Urinalysis    Component Value Date/Time   COLORURINE YELLOW 12/07/2016 1211   APPEARANCEUR CLEAR 12/07/2016 1211   LABSPEC 1.013 12/07/2016 1211   PHURINE 8.0 12/07/2016 1211   GLUCOSEU NEGATIVE 12/07/2016 1211   HGBUR NEGATIVE 12/07/2016 1211   BILIRUBINUR small (A) 03/14/2021 1840   BILIRUBINUR Negative 02/28/2021 1637   KETONESUR trace (5) (A) 03/14/2021 1840   KETONESUR NEGATIVE 12/07/2016 1211   PROTEINUR =30 (A)  03/14/2021 1840   PROTEINUR Positive (A) 02/28/2021 1637   PROTEINUR NEGATIVE 12/07/2016 1211   UROBILINOGEN 0.2 03/14/2021 1840   NITRITE Negative 03/14/2021 1840   NITRITE Negative 02/28/2021 1637   NITRITE NEGATIVE 12/07/2016 1211   LEUKOCYTESUR Negative 03/14/2021 1840   Sepsis Labs Recent Labs  Lab 06/23/24 1925 06/24/24 0452 06/25/24 0529  WBC 5.1 4.3 5.4   Microbiology Recent Results (from the past 240 hours)  Acid Fast Smear (AFB)     Status: None   Collection Time: 06/24/24 10:35 AM   Specimen: Lung, Right; Pleural Fluid  Result Value Ref Range Status   AFB Specimen Processing  Concentration  Final   Acid Fast Smear Negative  Final    Comment: (NOTE) Performed At: [facility] [address] [place], [place] [id number] Jennette Shorter MD Ey:[id number]    Source (AFB) PLEURAL  Final    Comment: RIGHT LUNG Performed at [facility], [address] [place], [place] [address]   Body fluid culture w Gram Stain     Status: None (Preliminary result)   Collection Time: 06/24/24 10:35 AM   Specimen: Lung, Right; Pleural Fluid  Result Value Ref Range Status   Specimen Description   Final    PLEURAL RIGHT LUNG Performed at [facility], [address], [place], [place] [address]    Special Requests   Final    NONE Performed at [facility], [address] [place], [place] [address]    Gram Stain NO WBC SEEN NO ORGANISMS SEEN   Final   Culture   Final    NO GROWTH 2 DAYS Performed at [facility], [address], [place], [place] [address]    Report Status PENDING  Incomplete     Time coordinating discharge: Over 30 minutes  SIGNED:   Camellia PARAS [place], DO  [facility] 06/26/2024, 10:42 AM

## 2024-06-27 LAB — BODY FLUID CULTURE W GRAM STAIN
Culture: NO GROWTH
Gram Stain: NONE SEEN

## 2024-06-27 LAB — CYTOLOGY - NON PAP

## 2024-07-01 ENCOUNTER — Ambulatory Visit: Attending: Physician Assistant | Admitting: Rheumatology

## 2024-07-01 ENCOUNTER — Encounter: Payer: Self-pay | Admitting: Rheumatology

## 2024-07-01 VITALS — BP 129/88 | HR 73 | Temp 97.5°F | Resp 17 | Ht 64.0 in | Wt 272.0 lb

## 2024-07-01 DIAGNOSIS — Z79899 Other long term (current) drug therapy: Secondary | ICD-10-CM | POA: Diagnosis not present

## 2024-07-01 DIAGNOSIS — E785 Hyperlipidemia, unspecified: Secondary | ICD-10-CM

## 2024-07-01 DIAGNOSIS — M0579 Rheumatoid arthritis with rheumatoid factor of multiple sites without organ or systems involvement: Secondary | ICD-10-CM | POA: Diagnosis not present

## 2024-07-01 DIAGNOSIS — J90 Pleural effusion, not elsewhere classified: Secondary | ICD-10-CM

## 2024-07-01 DIAGNOSIS — I10 Essential (primary) hypertension: Secondary | ICD-10-CM

## 2024-07-01 DIAGNOSIS — M19041 Primary osteoarthritis, right hand: Secondary | ICD-10-CM

## 2024-07-01 DIAGNOSIS — Z96651 Presence of right artificial knee joint: Secondary | ICD-10-CM

## 2024-07-01 DIAGNOSIS — Z6841 Body Mass Index (BMI) 40.0 and over, adult: Secondary | ICD-10-CM

## 2024-07-01 DIAGNOSIS — H209 Unspecified iridocyclitis: Secondary | ICD-10-CM | POA: Diagnosis not present

## 2024-07-01 DIAGNOSIS — M1712 Unilateral primary osteoarthritis, left knee: Secondary | ICD-10-CM

## 2024-07-01 DIAGNOSIS — M19042 Primary osteoarthritis, left hand: Secondary | ICD-10-CM

## 2024-07-01 NOTE — Progress Notes (Signed)
 Pharmacy Note  Subjective: Patient presents today to [facility] for follow up office visit. Patient seen by the pharmacist for counseling on methotrexate  and leflunomide for rheumatoid arthritis. She has history of iritis. Recent admission of pleural effusion secondary to RA  Objective: CBC    Component Value Date/Time   WBC 5.4 06/25/2024 0529   RBC 3.81 (L) 06/25/2024 0529   HGB 11.3 (L) 06/25/2024 0529   HGB 11.9 06/11/2018 1212   HCT 36.9 06/25/2024 0529   HCT 36.6 06/11/2018 1212   PLT 316 06/25/2024 0529   PLT 310 06/11/2018 1212   MCV 96.9 06/25/2024 0529   MCV 93 06/11/2018 1212   MCH 29.7 06/25/2024 0529   MCHC 30.6 06/25/2024 0529   RDW 14.3 06/25/2024 0529   RDW 14.4 06/11/2018 1212   LYMPHSABS 2.7 06/25/2024 0529   LYMPHSABS 2.3 06/11/2018 1212   MONOABS 0.6 06/25/2024 0529   EOSABS 0.0 06/25/2024 0529   EOSABS 0.2 06/11/2018 1212   BASOSABS 0.0 06/25/2024 0529   BASOSABS 0.0 06/11/2018 1212    CMP     Component Value Date/Time   NA 135 06/26/2024 0524   NA 138 06/11/2018 1212   K 3.5 06/26/2024 0524   CL 101 06/26/2024 0524   CO2 24 06/26/2024 0524   GLUCOSE 98 06/26/2024 0524   BUN 16 06/26/2024 0524   BUN 9 06/11/2018 1212   CREATININE 1.02 (H) 06/26/2024 0524   CREATININE 0.88 05/22/2024 1518   CALCIUM 9.0 06/26/2024 0524   PROT 8.6 (H) 06/25/2024 0529   PROT 8.0 06/11/2018 1212   ALBUMIN 3.5 06/25/2024 0529   ALBUMIN 3.8 06/11/2018 1212   AST 27 06/25/2024 0529   ALT 11 06/25/2024 0529   ALKPHOS 84 06/25/2024 0529   BILITOT 0.5 06/25/2024 0529   BILITOT 0.3 06/11/2018 1212   GFRNONAA >60 06/26/2024 0524   GFRNONAA 72 07/02/2020 1106   GFRAA 84 07/02/2020 1106    Baseline Immunosuppressant Therapy Labs TB GOLD    Latest Ref Rng & Units 07/19/2023    3:09 PM  Quantiferon TB Gold  Quantiferon TB Gold Plus NEGATIVE NEGATIVE    Hepatitis Panel    Latest Ref Rng & Units 06/13/2019    2:46 PM  Hepatitis  Hep B Surface Ag  NON-REACTI NON-REACTIVE   Hep B IgM NON-REACTI NON-REACTIVE   Hep C Ab NON-REACTI NON-REACTIVE   Hep A IgM NON-REACTI NON-REACTIVE    HIV Lab Results  Component Value Date   HIV Non Reactive 06/24/2024   HIV NON-REACTIVE 02/18/2020   Immunoglobulins    Latest Ref Rng & Units 02/18/2020    9:04 AM  Immunoglobulin Electrophoresis  IgA  47 - 310 mg/dL 348   IgG 399 - 8,359 mg/dL 7,781   IgM 50 - 699 mg/dL 78    SPEP    Latest Ref Rng & Units 06/25/2024    5:29 AM  Serum Protein Electrophoresis  Total Protein 6.5 - 8.1 g/dL 8.6    H3EI No results found for: G6PDH TPMT No results found for: TPMT   Chest-xray:  06/26/24 - Stable moderate-sized right pleural effusion and right lower lung zone patchy atelectasis or pneumonia.  Contraception: IUD  Alcohol use: rarely  Assessment/Plan:  Ideally MTX would be ideal treatment option due to history of iritis and pleural effusion.  Patient was counseled on the purpose, proper use, and adverse effects of methotrexate  including nausea, infection, and signs and symptoms of pneumonitis. Discussed that there is the possibility of  an increased risk of malignancy, specifically lymphomas, but it is not well understood if this increased risk is due to the medication or the disease state.  Instructed patient that medication should be held for infection and prior to surgery.  Advised patient to avoid live vaccines. Recommend annual influenza, Pneumovax 23, Prevnar 13, and Shingrix as indicated.   Reviewed instructions with patient to take methotrexate  weekly along with folic acid  daily.  Discussed the importance of frequent monitoring of kidney and liver function and blood counts, and provided patient with standing lab instructions.  Counseled patient to avoid NSAIDs and alcohol while on methotrexate .  Provided patient with educational materials on methotrexate  and answered all questions.   Patient voiced understanding.  Patient consented to  methotrexate  use.  Will upload into chart.    Dose of Rasuvo  will be 15mg  subcut once weekly x [redacted] weeks along with folic acid  2mg  daily. Repeat CBC/CMP. If stable increase to 20mg  subcut once weekly.  Patient was counseled on the purpose, proper use, and adverse effects of leflunomide including risk of infection, nausea/diarrhea/weight loss, increase in blood pressure, rash, hair loss, tingling in the hands and feet, and signs and symptoms of interstitial lung disease.   Also counseled on Black Box warning of liver injury and importance of avoiding alcohol while on therapy. Discussed that there is the possibility of an increased risk of malignancy but it is not well understood if this increased risk is due to the medication or the disease state.  Counseled patient to avoid live vaccines while on Arava. Recommend annual influenza, Pneumovax 23, Prevnar 13, and Shingrix as indicated.   Discussed the importance of frequent monitoring of liver function and blood count.  Standing orders placed.  Discussed importance of birth control while on leflunomide due to risk of congenital abnormalities, and patient confirms she has IUD.  Provided patient with educational materials on leflunomide and answered all questions.  Patient consented to Arava use, and consent will be uploaded into the media tab.    Dose will be leflunomide 10mg  daily x 2 weeks. Repeat CBC/CMP. IF stable increase to 20mg  daily x 2 weeks then every 3 months.  Plan to restart Rasuvo  if creatinine wnl. Most recent creatinine elevated could be due to diuretic use. If creatinine still elevated, will need to move forward with leflunomide  Sherry Pennant, PharmD, MPH, BCPS, CPP Clinical Pharmacist [facility])

## 2024-07-01 NOTE — Patient Instructions (Signed)
 Standing Labs We placed an order today for your standing lab work.   Please have your standing labs drawn in 2 weeks after starting Rasuvo  and then 2 months and then every 3 months.  Please have your labs drawn 2 weeks prior to your appointment so that the provider can discuss your lab results at your appointment, if possible.  Please note that you may see your imaging and lab results in MyChart before we have reviewed them. We will contact you once all results are reviewed. Please allow our office up to 72 hours to thoroughly review all of the results before contacting the office for clarification of your results.  WALK-IN LAB HOURS  Monday through Thursday from 8:00 am -12:30 pm and 1:00 pm-4:30 pm and Friday from 8:00 am-12:00 pm.  Patients with office visits requiring labs will be seen before walk-in labs.  You may encounter longer than normal wait times. Please allow additional time. Wait times may be shorter on  Monday and Thursday afternoons.  We do not book appointments for walk-in labs. We appreciate your patience and understanding with our staff.   Labs are drawn by [facility]. Please bring your co-pay at the time of your lab draw.  You may receive a bill from [facility] for your lab work.  Please note if you are on Hydroxychloroquine and and an order has been placed for a Hydroxychloroquine level,  you will need to have it drawn 4 hours or more after your last dose.  If you wish to have your labs drawn at another location, please call the office 24 hours in advance so we can fax the orders.  The office is located at [address], [place], [place] [address]   If you have any questions regarding directions or hours of operation,  please call [phone].   As a reminder, please drink plenty of water  prior to coming for your lab work. Thanks!   Vaccines You are taking a medication(s) that can suppress your immune system.  The following immunizations are recommended: Flu  annually (high dose) Covid-19  RSV Td/Tdap (tetanus, diphtheria, pertussis) every 10 years Pneumonia (Prevnar 15 then Pneumovax 23 at least 1 year apart.  Alternatively, can take Prevnar 20 without needing additional dose) Shingrix: 2 doses from 4 weeks to 6 months apart  Please check with your PCP to make sure you are up to date.   If you have signs or symptoms of an infection or start antibiotics: First, call your PCP for workup of your infection. Hold your medication through the infection, until you complete your antibiotics, and until symptoms resolve if you take the following: Injectable medication (Actemra, Benlysta, Cimzia , Cosentyx, Enbrel, Humira, Kevzara, Orencia, Remicade, Simponi, Stelara, Taltz, Tremfya) Methotrexate  Leflunomide (Arava) Mycophenolate (Cellcept) Earma, Olumiant, or Rinvoq  Please get an annual skin examination to screen for skin cancer while you are on Cimzia .  Please use sunscreen and sun protection.  Methotrexate  Injection What is this medication? METHOTREXATE  (METH oh TREX ate) treats inflammatory conditions such as arthritis and psoriasis. It works by decreasing inflammation, which can reduce pain and prevent long-term injury to the joints and skin. It may also be used to treat some types of cancer. It works by slowing down the growth of cancer cells. This medicine may be used for other purposes; ask your health care provider or pharmacist if you have questions. What should I tell my care team before I take this medication? They need to know if you have any  of these conditions: Fluid in the stomach area or lungs Frequently drink alcohol Infection or immune system problems Kidney disease Liver disease Low blood counts (white cells, platelets, or red blood cells) Lung disease Recent or ongoing radiation Recent or upcoming vaccine Stomach ulcers Ulcerative colitis An unusual or allergic reaction to methotrexate , other medications, foods, dyes, or  preservatives Pregnant or trying to get pregnant Breastfeeding How should I use this medication? This medication is for infusion into a vein or for injection into muscle or into the spinal fluid (whichever applies). It is usually given in a hospital or clinic setting. In rare cases, you might get this medication at home. You will be taught how to give this medication. Use exactly as directed. Take your medication at regular intervals. Do not take your medication more often than directed. If this medication is used for arthritis or psoriasis, it should be taken weekly, NOT daily. It is important that you put your used needles and syringes in a special sharps container. Do not put them in a trash can. If you do not have a sharps container, call your pharmacist or care team to get one. Talk to your care team about the use of this medication in children. While this medication may be prescribed for children as young as 2 years for selected conditions, precautions do apply. Overdosage: If you think you have taken too much of this medicine contact a poison control center or emergency room at once. NOTE: This medicine is only for you. Do not share this medicine with others. What if I miss a dose? It is important not to miss your dose. Call your care team if you are unable to keep an appointment. If you give yourself the medication, and you miss a dose, talk with your care team. Do not take double or extra doses. What may interact with this medication? Do not take this medication with any of the following: Acitretin Probenecid This medication may also interact with the following: Aspirin  or aspirin -like medications Azathioprine Certain antibiotics, such as gentamicin, penicillin, tetracycline, vancomycin Certain medications that treat or prevent blood clots, such as warfarin, apixaban, dabigatran, rivaroxaban Certain medications for stomach problems, such as esomeprazole, omeprazole,  pantoprazole  Dapsone Hydroxychloroquine Live virus vaccines Medications for viral infections, such as acyclovir, cidofovir, foscarnet, ganciclovir Mercaptopurine NSAIDs, medications for pain and inflammation, such as ibuprofen  or naproxen  Phenytoin Pyrimethamine Retinoids, such as isotretinoin or tretinoin Sulfonamides, such as sulfasalazine or trimethoprim; sulfamethoxazole Theophylline This list may not describe all possible interactions. Give your health care provider a list of all the medicines, herbs, non-prescription drugs, or dietary supplements you use. Also tell them if you smoke, drink alcohol, or use illegal drugs. Some items may interact with your medicine. What should I watch for while using this medication? This medication may make you feel generally unwell. This is not uncommon as chemotherapy can affect healthy cells as well as cancer cells. Report any side effects. Continue your course of treatment even though you feel ill unless your care team tells you to stop. Your condition will be monitored carefully while you are receiving this medication. Avoid alcoholic drinks. This medication can cause serious side effects. To reduce the risk, your care team may give you other medications to take before receiving this one. Be sure to follow the directions from your care team. This medication can make you more sensitive to the sun. Keep out of the sun. If you cannot avoid being in the sun, wear protective clothing and use  sunscreen. Do not use sun lamps or tanning beds/booths. You may get drowsy or dizzy. Do not drive, use machinery, or do anything that needs mental alertness until you know how this medication affects you. Do not stand or sit up quickly, especially if you are an older patient. This reduces the risk of dizzy or fainting spells. You may need blood work while you are taking this medication. Call your care team for advice if you get a fever, chills or sore throat, or other  symptoms of a cold or flu. Do not treat yourself. This medication decreases your body's ability to fight infections. Try to avoid being around people who are sick. This medication may increase your risk to bruise or bleed. Call your care team if you notice any unusual bleeding. Be careful brushing or flossing your teeth or using a toothpick because you may get an infection or bleed more easily. If you have any dental work done, tell your dentist you are receiving this medication Check with your care team if you get an attack of severe diarrhea, nausea and vomiting, or if you sweat a lot. The loss of too much body fluid can make it dangerous for you to take this medication. Talk to your care team about your risk of cancer. You may be more at risk for certain types of cancers if you take this medication. Do not become pregnant while taking this medication or for 6 months after stopping it. Women should inform their care team if they wish to become pregnant or think they might be pregnant. Men should not father a child while taking this medication and for 3 months after stopping it. There is potential for serious harm to an unborn child. Talk to your care team for more information. Do not breast-feed an infant while taking this medication or for 1 week after stopping it. This medication may make it more difficult to get pregnant or father a child. Talk to your care team if you are concerned about your fertility. What side effects may I notice from receiving this medication? Side effects that you should report to your care team as soon as possible: Allergic reactions--skin rash, itching, hives, swelling of the face, lips, tongue, or throat Blood clot--pain, swelling, or warmth in the leg, shortness of breath, chest pain Dry cough, shortness of breath or trouble breathing Infection--fever, chills, cough, sore throat, wounds that don't heal, pain or trouble when passing urine, general feeling of discomfort or  being unwell Kidney injury--decrease in the amount of urine, swelling of the ankles, hands, or feet Liver injury--right upper belly pain, loss of appetite, nausea, light-colored stool, dark yellow or brown urine, yellowing of the skin or eyes, unusual weakness or fatigue Low red blood cell count--unusual weakness or fatigue, dizziness, headache, trouble breathing Redness, blistering, peeling, or loosening of the skin, including inside the mouth Seizures Unusual bruising or bleeding Side effects that usually do not require medical attention (report to your care team if they continue or are bothersome): Diarrhea Dizziness Hair loss Nausea Pain, redness, or swelling with sores inside the mouth or throat Vomiting This list may not describe all possible side effects. Call your doctor for medical advice about side effects. You may report side effects to FDA at 1-800-FDA-1088. Where should I keep my medication? This medication is given in a hospital or clinic. It will not be stored at home. NOTE: This sheet is a summary. It may not cover all possible information. If you have questions about  this medicine, talk to your doctor, pharmacist, or health care provider.  2024 Elsevier/Gold Standard (2023-02-07 00:00:00)  Leflunomide Tablets What is this medication? LEFLUNOMIDE (le FLOO na mide) treats the symptoms of rheumatoid arthritis. It works by slowing down an overactive immune system. This decreases inflammation. It belongs to a group of medications called DMARDs. This medicine may be used for other purposes; ask your health care provider or pharmacist if you have questions. COMMON BRAND NAME(S): Arava What should I tell my care team before I take this medication? They need to know if you have any of these conditions: Cancer Diabetes High blood pressure Immune system problems Infection Kidney disease Liver disease Low blood cell levels (white cells, red cells, and platelets) Lung or  breathing disease, such as asthma or COPD Recent or upcoming vaccine Skin conditions Tingling of the fingers or toes, or other nerve disorder An unusual or allergic reaction to leflunomide, other medications, food, dyes, or preservatives Pregnant or trying to get pregnant Breastfeeding How should I use this medication? Take this medication by mouth with a full glass of water . Take it as directed on the prescription label at the same time every day. Keep taking it unless your care team tells you to stop. Talk to your care team about the use of this medication in children. Special care may be needed. Overdosage: If you think you have taken too much of this medicine contact a poison control center or emergency room at once. NOTE: This medicine is only for you. Do not share this medicine with others. What if I miss a dose? If you miss a dose, take it as soon as you can. If it is almost time for your next dose, take only that dose. Do not take double or extra doses. What may interact with this medication? Do not take this medication with any of the following: Teriflunomide This medication may also interact with the following: Alosetron Caffeine Cefaclor Certain medications for diabetes, such as nateglinide, repaglinide, rosiglitazone, pioglitazone Certain medications for high cholesterol, such as atorvastatin, pravastatin, rosuvastatin, simvastatin Charcoal Cholestyramine Ciprofloxacin Duloxetine Estrogen and progestin hormones Furosemide Ketoprofen Live virus vaccines Medications that increase your risk for infection Methotrexate  Mitoxantrone Paclitaxel Penicillin Theophylline Tizanidine Warfarin This list may not describe all possible interactions. Give your health care provider a list of all the medicines, herbs, non-prescription drugs, or dietary supplements you use. Also tell them if you smoke, drink alcohol, or use illegal drugs. Some items may interact with your  medicine. What should I watch for while using this medication? Visit your care team for regular checks on your progress. Tell your care team if your symptoms do not start to get better or if they get worse. You may need blood work done while you are taking this medication. This medication may cause serious skin reactions. They can happen weeks to months after starting the medication. Contact your care team right away if you notice fevers or flu-like symptoms with a rash. The rash may be red or purple and then turn into blisters or peeling of the skin. You may also notice a red rash with swelling of the face, lips, or lymph nodes in your neck or under your arms. You should not receive certain vaccines during your treatment and for a certain time after your treatment with this medication ends. Talk to your care team for more information. This medication may stay in your body for up to 2 years after your last dose. Tell your care team about  any unusual side effects or symptoms. A medication can be given to help lower your blood levels of this medication more quickly. Talk to your care team if you may be pregnant. This medication can cause serious birth defects if taken during pregnancy and for a while after the last dose. You will need a negative pregnancy test before starting this medication. Contraception is recommended while taking this medication and for a while after the last dose. Your care team can help you find the option that works for you. Do not breastfeed while taking this medication. What side effects may I notice from receiving this medication? Side effects that you should report to your care team as soon as possible: Allergic reactions--skin rash, itching, hives, swelling of the face, lips, tongue, or throat Dry cough, shortness of breath or trouble breathing Increase in blood pressure Infection--fever, chills, cough, sore throat, wounds that don't heal, pain or trouble when passing urine,  general feeling of discomfort or being unwell Redness, blistering, peeling, or loosening of the skin, including inside the mouth Liver injury--right upper belly pain, loss of appetite, nausea, light-colored stool, dark yellow or brown urine, yellowing skin or eyes, unusual weakness or fatigue Pain, tingling, or numbness in the hands or feet Unusual bruising or bleeding Side effects that usually do not require medical attention (report to your care team if they continue or are bothersome): Back pain Diarrhea Hair loss Headache Nausea This list may not describe all possible side effects. Call your doctor for medical advice about side effects. You may report side effects to FDA at 1-800-FDA-1088. Where should I keep my medication? Keep out of the reach of children and pets. Store at room temperature between 20 and 25 degrees C (68 and 77 degrees F). Protect from moisture and light. Keep the container tightly closed. Get rid of any unused medication after the expiration date. To get rid of medications that are no longer needed or have expired: Take the medication to a medication take-back program. Check with your pharmacy or law enforcement to find a location. If you cannot return the medication, ask your pharmacist or care team how to get rid of this medication safely. NOTE: This sheet is a summary. It may not cover all possible information. If you have questions about this medicine, talk to your doctor, pharmacist, or health care provider.  2024 Elsevier/Gold Standard (2022-02-02 00:00:00)

## 2024-07-02 ENCOUNTER — Encounter: Payer: Self-pay | Admitting: Internal Medicine

## 2024-07-02 ENCOUNTER — Ambulatory Visit: Payer: Self-pay | Admitting: Rheumatology

## 2024-07-02 ENCOUNTER — Ambulatory Visit: Admitting: Internal Medicine

## 2024-07-02 ENCOUNTER — Other Ambulatory Visit: Payer: Self-pay | Admitting: Internal Medicine

## 2024-07-02 VITALS — BP 120/80 | HR 75 | Temp 98.3°F | Ht 64.0 in | Wt 273.0 lb

## 2024-07-02 DIAGNOSIS — J90 Pleural effusion, not elsewhere classified: Secondary | ICD-10-CM

## 2024-07-02 DIAGNOSIS — Z79899 Other long term (current) drug therapy: Secondary | ICD-10-CM

## 2024-07-02 DIAGNOSIS — Z9225 Personal history of immunosupression therapy: Secondary | ICD-10-CM

## 2024-07-02 DIAGNOSIS — M0579 Rheumatoid arthritis with rheumatoid factor of multiple sites without organ or systems involvement: Secondary | ICD-10-CM

## 2024-07-02 DIAGNOSIS — I10 Essential (primary) hypertension: Secondary | ICD-10-CM | POA: Diagnosis not present

## 2024-07-02 DIAGNOSIS — R748 Abnormal levels of other serum enzymes: Secondary | ICD-10-CM

## 2024-07-02 DIAGNOSIS — Z09 Encounter for follow-up examination after completed treatment for conditions other than malignant neoplasm: Secondary | ICD-10-CM

## 2024-07-02 DIAGNOSIS — Z111 Encounter for screening for respiratory tuberculosis: Secondary | ICD-10-CM

## 2024-07-02 MED ORDER — FUROSEMIDE 40 MG PO TABS: 40.0000 mg | ORAL_TABLET | Freq: Every day | ORAL | 2 refills | Status: DC

## 2024-07-02 NOTE — Patient Instructions (Addendum)
 Try local patches to see if helps  pain  Before using the tylenol .  Plan out   until see pulmonary  and then have them reassess.   Stay o same bp med  Plan keep pulmonary appt.   Will do  out of work till oct 22 and then go from there.   Will do further record review .   Consider flu vaccine prevnar 20 or other   this fall . And can discuss with pulmonary also .   I will also  order a cardiology consult .   Plan liver tests ad  fu in about a month

## 2024-07-02 NOTE — Progress Notes (Signed)
 Chief Complaint  Patient presents with   Hospitalization Follow-up    Pt is here for hospital f/u. Pt reports she is having come pain on back from procedure to drain the liquid. Pt states she has FMLA and handicap form for provider to fill out.     HPI: AMITA Jennifer [name] 48 y.o. come in for follow-up of his hospitalization where she presented with cough and shortness of breath and was found to have a moderately large right pleural effusion of unknown cause.  Treated for pneumonia Evaluated and tapped was not felt to be infectious echocardiogram seem to be unimpressive for heart failure with normal LV function her blood pressure was noted to be more elevated and her amlodipine  was increased to 10 mg was given Lasix 40 twice daily prednisone  course and then sent home.  See below for hospital discharge summary.  1 suggestion was that the pleural effusion was remain illogically based she has a follow-up new appointment with the pulmonary team October 22.  Since discharge she has had a little bit of cough at night but no increased shortness of breath as before. She has soreness in the right lower thorax area where the tap was performed but no fever or other systemic symptoms. She was told that one of her test was abnormal from the rheumatologist from yesterday.  Told not to take anti-inflammatories and alcohol.  And to follow-up with PCP based on the messages  Asked what she can take for pain. She has been out of work since October 6 and needs FMLA filled out and maybe a note for her work.  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   ROS: See pertinent positives and negatives per HPI.  Past Medical History:  Diagnosis Date   Acute otitis media 08/28/2012   improved  change to liquid medication    Anxiety disorder 03/29/2016   Breast abscess of female    Recurrent   Depressive disorder 03/29/2016   Family history of adverse reaction to anesthesia     father hard time waking once (12/20/2016)   Fibroids    w/bleeding   GERD (gastroesophageal reflux disease)    History of blood transfusion    after surgery   Hypertension    Migraines    Dr. Layman Meissner; I have a few/month (12/20/2016)   Osteoarthritis    Pill esophagitis 08/28/2012   amoxicillin   by hx  disc plan nl voice except hoarse not drooling  close fu  if not getting better with plan stop aleve   liquid ibu onlyf necessary    Recurrent periodic urticaria    Rheumatoid arthritis (HCC)    55% of my body (12/20/2016)   Rheumatoid arthritis (HCC)    Sleep apnea    wears cpap    Family History  Problem Relation Age of Onset   Hypertension Mother    Rheum arthritis Mother    Hypertension Father    Sleep apnea Father    Breast cancer Neg Hx    Colon cancer Neg Hx    Colon polyps Neg Hx    Esophageal cancer Neg Hx    Rectal cancer Neg Hx    Stomach cancer Neg Hx     Social History   Socioeconomic History   Marital status: Single    Spouse name: Not on file   Number of children: Not on file   Years of education: Not on file  Highest education level: Some college, no degree  Occupational History   Not on file  Tobacco Use   Smoking status: Never    Passive exposure: Never   Smokeless tobacco: Never  Vaping Use   Vaping status: Never Used  Substance and Sexual Activity   Alcohol use: Yes    Comment: social   Drug use: Never   Sexual activity: Not on file  Other Topics Concern   Not on file  Social History Narrative   Not on file   Social Drivers of Health   Financial Resource Strain: Low Risk  (06/20/2023)   Overall Financial Resource Strain (CARDIA)    Difficulty of Paying Living Expenses: Not hard at all  Food Insecurity: No Food Insecurity (06/24/2024)   Hunger Vital Sign    Worried About Running Out of Food in the Last Year: Never true    Ran Out of Food in the Last Year: Never true  Transportation Needs: No Transportation Needs (06/24/2024)    PRAPARE - Administrator, Civil Service (Medical): No    Lack of Transportation (Non-Medical): No  Physical Activity: Inactive (06/20/2023)   Exercise Vital Sign    Days of Exercise per Week: 0 days    Minutes of Exercise per Session: 0 min  Stress: Stress Concern Present (06/20/2023)   [employer] of Occupational Health - Occupational Stress Questionnaire    Feeling of Stress : Rather much  Social Connections: Moderately Integrated (06/20/2023)   Social Connection and Isolation Panel    Frequency of Communication with Friends and Family: More than three times a week    Frequency of Social Gatherings with Friends and Family: Never    Attends Religious Services: More than 4 times per year    Active Member of [employer] or Organizations: Yes    Attends Engineer, structural: More than 4 times per year    Marital Status: Never married    Outpatient Medications Prior to Visit  Medication Sig Dispense Refill   amLODipine  (NORVASC ) 10 MG tablet Take 1 tablet (10 mg total) by mouth daily. 90 tablet 0   certolizumab pegol  (CIMZIA ) 2 X 200 MG KIT Inject 400 mg into the skin every 28 (twenty-eight) days.     HYDROcodone  bit-homatropine (HYCODAN) 5-1.5 MG/5ML syrup Take 5 mLs by mouth every 6 (six) hours as needed for cough. 120 mL 0   Levonorgestrel (KYLEENA) 19.5 MG IUD 19.5 mg by Intrauterine route once.     NON FORMULARY Pt uses cpap nightly     prednisoLONE  acetate (PRED FORTE ) 1 % ophthalmic suspension Place 1 drop into the left eye 2 (two) times daily. As needed     predniSONE  (DELTASONE ) 10 MG tablet Take 2 tablets (20 mg total) by mouth daily for 5 days, THEN 1 tablet (10 mg total) daily for 5 days, THEN 0.5 tablets (5 mg total) daily for 5 days. 18 tablet 0   SUMAtriptan  (TOSYMRA ) 10 MG/ACT SOLN Place 10 mg into the nose once as needed for up to 1 dose. May repeat after 1 hour.  Maximum 2 sprays in 24 hours. 6 each 11   furosemide (LASIX) 40 MG tablet Take 1 tablet (40  mg total) by mouth 2 (two) times daily. 30 tablet 0   cetirizine  (ZYRTEC ) 10 MG tablet Take 10 mg by mouth daily as needed for allergies. (Patient not taking: Reported on 07/02/2024)     clotrimazole-betamethasone (LOTRISONE) cream SMARTSIG:Topical Morning-Evening (Patient not taking: Reported on 07/02/2024)  diazepam  (VALIUM ) 5 MG tablet Take 2.5-5 mg by mouth at bedtime as needed for sedation. (Patient not taking: Reported on 07/02/2024)     No facility-administered medications prior to visit.     EXAM:  BP 120/80 (BP Location: Right Arm, Patient Position: Sitting, Cuff Size: Large)   Pulse 75   Temp 98.3 F (36.8 C) (Oral)   Ht 5' 4 (1.626 m)   Wt 273 lb (123.8 kg)   SpO2 96%   BMI 46.86 kg/m   Body mass index is 46.86 kg/m.  GENERAL: vitals reviewed and listed above, alert, oriented, appears well hydrated and in no acute distress HEENT: atraumatic, conjunctiva  clear, no obvious abnormalities on inspection of external nose and ears  NECK: no obvious masses on inspection palpation  LUNGS: clear to auscultation bilaterally, no wheezes, rales or rhonchi, appears to have air movement at both bases seems to be equal maybe decreased RML CV: HRRR, no clubbing cyanosis or significant peripheral edema nl cap refill  MS: moves all extremities without noticeable focal  abnormality PSYCH: pleasant and cooperative, no obvious depression or anxiety Lab Results  Component Value Date   WBC 5.4 06/25/2024   HGB 11.3 (L) 06/25/2024   HCT 36.9 06/25/2024   PLT 316 06/25/2024   GLUCOSE 80 07/01/2024   CHOL 181 05/22/2024   TRIG 66 05/22/2024   HDL 70 05/22/2024   LDLCALC 96 05/22/2024   ALT 36 (H) 07/01/2024   AST 41 (H) 07/01/2024   NA 137 07/01/2024   K 3.7 07/01/2024   CL 98 07/01/2024   CREATININE 0.96 07/01/2024   BUN 15 07/01/2024   CO2 28 07/01/2024   TSH 1.040 06/25/2024   INR 1.1 (H) 02/28/2021   HGBA1C 5.4 06/20/2023   BP Readings from Last 3 Encounters:  07/02/24  120/80  07/01/24 129/88  06/26/24 139/89  IMPRESSION: 1. Moderate volume right pleural effusion. Associated partial collapse of the right lower lobe and right middle lobe. Recommend thoracentesis with cytology for further evaluation of possible underlying malignancy. 2. Right hilar, mediastinal, pericardiac lymphadenopathy. Recommend attention on follow-up. 3. At least small volume hiatal hernia.     Electronically Signed   By: Morgane  Naveau M.D.   On: 06/23/2024 20:41  ASSESSMENT AND PLAN:  Discussed the following assessment and plan:  Hospital discharge follow-up  Pleural effusion - Plan: Ambulatory referral to Cardiology  Abnormal transaminases  Essential hypertension - Plan: Ambulatory referral to Cardiology  Rheumatoid arthritis involving multiple sites with positive rheumatoid factor (HCC) - Plan: Ambulatory referral to Cardiology  Medication management She wants to wait on vaccines until later and maybe asked the pulmonary team but we discussed influenza and pneumococcal vaccination. Will prob need a fu ct imagining at  Potassium  level ok with labs yesterday   Dec lasix to once a day for now  She also reported  possibly having a cardiology consult . Bp is in range   was very high in hosp.  Avoid nsaid and etoh and min tylenol  for now try pain patches  where thoracentesis   was  for now .  Will do fmla form  reassess return oct 23 or as per  specialty team  60 minutes review  visit forms  documentation.  -Patient advised to return or notify health care team  if  new concerns arise.  Patient Instructions  Try local patches to see if helps  pain  Before using the tylenol .  Plan out   until see pulmonary  and then have them reassess.   Stay o same bp med  Plan keep pulmonary appt.   Will do  out of work till oct 22 and then go from there.   Will do further record review .   Consider flu vaccine prevnar 20 or other   this fall . And can discuss with  pulmonary also .   I will also  order a cardiology consult .   Plan liver tests ad  fu in about a month       Kameah Rawl K. Niyanna Asch M.D.  Admit date: 06/23/2024 Discharge date: 06/26/2024   Admitted From: Home Disposition: Home   Recommendations for Outpatient Follow-up:  Follow up with PCP in 1-2 weeks Outpatient follow-up with rheumatology 1-2 weeks Referral placed to pulmonology for follow-up regarding pleural effusion Amlodipine  increased to 10 mg p.o. daily for poorly controlled hypertension Started on Lasix 40 g p.o. twice daily x 3 days for pleural effusion Prednisone  taper for pleural effusion likely related to rheumatoid arthritis Please obtain BMP in one week Recommend repeat chest x-ray 1-2 weeks If recurrence regarding her pleural effusion may need pleurodesis

## 2024-07-02 NOTE — Progress Notes (Signed)
 Liver functions are elevated.  Patient should avoid NSAIDs and alcohol use.  Please forward results to her PCP.

## 2024-07-03 ENCOUNTER — Telehealth: Payer: Self-pay

## 2024-07-03 LAB — COMPREHENSIVE METABOLIC PANEL WITH GFR
AG Ratio: 0.8 (calc) — ABNORMAL LOW (ref 1.0–2.5)
ALT: 36 U/L — ABNORMAL HIGH (ref 6–29)
AST: 41 U/L — ABNORMAL HIGH (ref 10–35)
Albumin: 4.1 g/dL (ref 3.6–5.1)
Alkaline phosphatase (APISO): 78 U/L (ref 31–125)
BUN: 15 mg/dL (ref 7–25)
CO2: 28 mmol/L (ref 20–32)
Calcium: 9.3 mg/dL (ref 8.6–10.2)
Chloride: 98 mmol/L (ref 98–110)
Creat: 0.96 mg/dL (ref 0.50–0.99)
Globulin: 5.3 g/dL — ABNORMAL HIGH (ref 1.9–3.7)
Glucose, Bld: 80 mg/dL (ref 65–99)
Potassium: 3.7 mmol/L (ref 3.5–5.3)
Sodium: 137 mmol/L (ref 135–146)
Total Bilirubin: 0.4 mg/dL (ref 0.2–1.2)
Total Protein: 9.4 g/dL — ABNORMAL HIGH (ref 6.1–8.1)
eGFR: 73 mL/min/1.73m2 (ref >=60)

## 2024-07-03 LAB — QUANTIFERON-TB GOLD PLUS
Mitogen-NIL: 6.97 [IU]/mL
NIL: 0.31 [IU]/mL
QuantiFERON-TB Gold Plus: POSITIVE — AB
TB1-NIL: 6.84 [IU]/mL
TB2-NIL: 6.97 [IU]/mL

## 2024-07-03 NOTE — Telephone Encounter (Signed)
 Follow up with pt regarding to complete form. Pt states to fax to [facility].   Pt follow up on pain patch that was discuss with provider. Pt wants to know if Rx will be sent in for her. Inform pt she can get it over the counter but will send a message to provider. Will give her an update once get a response.   Pt's FMLA form is sent. Please advise on the pain patch.

## 2024-07-03 NOTE — Progress Notes (Signed)
 Elevated LFTs could be related to recent use of antibiotics.  We will have to repeat TB Gold to confirm if it is positive.

## 2024-07-03 NOTE — Progress Notes (Signed)
 CMP shows elevated LFTs.  Patient should avoid all NSAIDs and alcohol use.  She repeat LFTs in 3 weeks. TB Gold is positive please ask patient if she has traveled outside the country.  We should repeat TB Gold.

## 2024-07-04 NOTE — Telephone Encounter (Signed)
 I would try otc  lidocaine  patch 4%  one brand in [place]

## 2024-07-07 ENCOUNTER — Telehealth: Payer: Self-pay | Admitting: Pharmacist

## 2024-07-07 NOTE — Telephone Encounter (Signed)
 Called patient to re-verify benefits for in-office Cimzia . She states she is not sure if she is keeping the same insurance. Her employer has open enrollment right now and she has not yet worked on. She requested cal in a few weeks to re-discuss   Sherry Pennant, PharmD, MPH, BCPS, CPP Clinical Pharmacist [facility])

## 2024-07-07 NOTE — Telephone Encounter (Signed)
 Message was sent to pt's mychart.

## 2024-07-09 ENCOUNTER — Ambulatory Visit

## 2024-07-09 VITALS — BP 136/88 | HR 73 | Temp 97.8°F | Ht 64.0 in | Wt 273.8 lb

## 2024-07-09 DIAGNOSIS — R0609 Other forms of dyspnea: Secondary | ICD-10-CM

## 2024-07-09 DIAGNOSIS — J90 Pleural effusion, not elsewhere classified: Secondary | ICD-10-CM | POA: Diagnosis not present

## 2024-07-09 DIAGNOSIS — R591 Generalized enlarged lymph nodes: Secondary | ICD-10-CM

## 2024-07-09 DIAGNOSIS — Z227 Latent tuberculosis: Secondary | ICD-10-CM

## 2024-07-09 DIAGNOSIS — R053 Chronic cough: Secondary | ICD-10-CM | POA: Diagnosis not present

## 2024-07-09 DIAGNOSIS — R7612 Nonspecific reaction to cell mediated immunity measurement of gamma interferon antigen response without active tuberculosis: Secondary | ICD-10-CM

## 2024-07-09 DIAGNOSIS — M0520 Rheumatoid vasculitis with rheumatoid arthritis of unspecified site: Secondary | ICD-10-CM | POA: Diagnosis not present

## 2024-07-09 NOTE — Patient Instructions (Signed)
  VISIT SUMMARY: During your visit, we discussed your ongoing symptoms of shortness of breath and persistent cough following a recent hospital stay for pleural effusion. We reviewed your history of rheumatoid arthritis and the recent procedures you underwent, including fluid drainage from around your lung. We also discussed the findings from your recent tests and scans, including the presence of enlarged lymph nodes and a positive test for latent tuberculosis infection.  YOUR PLAN: -RIGHT PLEURAL EFFUSION: Right pleural effusion means there is fluid buildup around your right lung, likely due to your rheumatoid arthritis. We will perform an ultrasound to check the fluid and find a safe spot for drainage. You will be referred to interventional radiology for an outpatient procedure to drain the fluid again. After the drainage, a CT scan will be done to check your lymph nodes and lung expansion. The drained fluid will be tested for bacteria, cancer cells, and glucose levels.  -CHRONIC COUGH AND DYSPNEA: Your persistent cough and difficulty breathing are likely due to the remaining fluid around your lung. We will monitor your symptoms closely, and you should report any worsening of your cough or breathing.  -ENLARGED INTRATHORACIC LYMPH NODES: Enlarged lymph nodes inside your chest were noted on your CT scan, which could be due to infection, inflammation, or cancer. We will repeat the CT scan in late November to check the size of the lymph nodes and the condition of your lungs.  -LATENT TUBERCULOSIS INFECTION: A positive QuantiFERON test indicates you have a latent tuberculosis infection, meaning the bacteria are in your body but not causing active disease. We will repeat the test to confirm this and refer you to an infectious disease specialist for management.  -RHEUMATOID ARTHRITIS: Your rheumatoid arthritis is well-controlled with Cimzia , but the positive tuberculosis test complicates its use. You should  hold off on your Cimzia  injections until we clarify your TB status. We will coordinate with your rheumatologist to adjust your medication based on your TB status and the recurrence of pleural effusion.  INSTRUCTIONS: Please follow up with interventional radiology for the outpatient thoracentesis and schedule the repeat CT scan for late November. Additionally, hold off on your Cimzia  injections until further notice and await the referral to an infectious disease specialist for your latent TB management.                      Contains text generated by [employer]bridge.                                 Contains text generated by Abridge.

## 2024-07-09 NOTE — Progress Notes (Signed)
 Subjective:   PATIENT ID: Jennifer [name] GENDER: female DOB: [birth year], MRN: [id number]   HPI Discussed the use of AI scribe software for clinical note transcription with the patient, who gave verbal consent to proceed.  History of Present Illness Jennifer [name] is a 48 year old female with rheumatoid arthritis who presents with shortness of breath and pleural effusion. She has a history of rheumatoid arthritis and presented to urgent care with shortness of breath. An x-ray revealed fluid around her right lung, leading to a referral to the emergency room. In the hospital, she received antibiotics and underwent a thoracentesis by interventional radiology to drain 1.1 liters of fluid from around her lung. Despite the drainage, she continues to experience shortness of breath and a persistent dry cough.  Prior to the onset of shortness of breath, she experienced a dry cough for about a week without fever or headache. She received her Cimzia  injection on a Tuesday and was around many people on the following Friday and Saturday. She noticed shortness of breath while walking on Saturday, which worsened to gasping for air by the time she reached her car. Despite these symptoms, she did not seek immediate medical attention until Monday when the cough persisted.  Since the hospital discharge, her shortness of breath and cough have persisted. She has not been very active, limiting her walking to short distances such as to the trash can or the street, and notes that she becomes out of breath even with minimal exertion. She is unsure if the fluid has reaccumulated but notes that her symptoms have not significantly improved since the hospital stay.  Her rheumatoid arthritis symptoms, including joint pain, have been well controlled with her current medication regimen, which includes Cimzia  injections once a month. She has not experienced any fevers, chills, or exposure to sick individuals prior to the onset of  her symptoms. Her partner confirms that she has not complained of any other symptoms.  During her last rheumatology visit, a QuantiFERON gold test was ordered and came back positive.  Patient states that in June she went to [place].     Past Medical History:  Diagnosis Date   Acute otitis media 08/28/2012   improved  change to liquid medication    Anxiety disorder 03/29/2016   Breast abscess of female    Recurrent   Depressive disorder 03/29/2016   Family history of adverse reaction to anesthesia    father hard time waking once (12/20/2016)   Fibroids    w/bleeding   GERD (gastroesophageal reflux disease)    History of blood transfusion    after surgery   Hypertension    Migraines    Dr. Layman Meissner; I have a few/month (12/20/2016)   Osteoarthritis    Pill esophagitis 08/28/2012   amoxicillin   by hx  disc plan nl voice except hoarse not drooling  close fu  if not getting better with plan stop aleve   liquid ibu onlyf necessary    Recurrent periodic urticaria    Rheumatoid arthritis (HCC)    55% of my body (12/20/2016)   Rheumatoid arthritis (HCC)    Sleep apnea    wears cpap     Family History  Problem Relation Age of Onset   Hypertension Mother    Rheum arthritis Mother    Hypertension Father    Sleep apnea Father    Breast cancer Neg Hx    Colon cancer Neg Hx    Colon polyps Neg Hx  Esophageal cancer Neg Hx    Rectal cancer Neg Hx    Stomach cancer Neg Hx      Social History   Socioeconomic History   Marital status: Single    Spouse name: Not on file   Number of children: Not on file   Years of education: Not on file   Highest education level: Some college, no degree  Occupational History   Not on file  Tobacco Use   Smoking status: Never    Passive exposure: Never   Smokeless tobacco: Never  Vaping Use   Vaping status: Never Used  Substance and Sexual Activity   Alcohol use: Yes    Comment: social   Drug use: Never   Sexual activity: Not on  file  Other Topics Concern   Not on file  Social History Narrative   Not on file   Social Drivers of Health   Financial Resource Strain: Low Risk  (06/20/2023)   Overall Financial Resource Strain (CARDIA)    Difficulty of Paying Living Expenses: Not hard at all  Food Insecurity: No Food Insecurity (06/24/2024)   Hunger Vital Sign    Worried About Running Out of Food in the Last Year: Never true    Ran Out of Food in the Last Year: Never true  Transportation Needs: No Transportation Needs (06/24/2024)   PRAPARE - Administrator, Civil Service (Medical): No    Lack of Transportation (Non-Medical): No  Physical Activity: Inactive (06/20/2023)   Exercise Vital Sign    Days of Exercise per Week: 0 days    Minutes of Exercise per Session: 0 min  Stress: Stress Concern Present (06/20/2023)   [employer] of Occupational Health - Occupational Stress Questionnaire    Feeling of Stress : Rather much  Social Connections: Moderately Integrated (06/20/2023)   Social Connection and Isolation Panel    Frequency of Communication with Friends and Family: More than three times a week    Frequency of Social Gatherings with Friends and Family: Never    Attends Religious Services: More than 4 times per year    Active Member of [employer] or Organizations: Yes    Attends Engineer, structural: More than 4 times per year    Marital Status: Never married  Intimate Partner Violence: Not At Risk (06/24/2024)   Humiliation, Afraid, Rape, and Kick questionnaire    Fear of Current or Ex-Partner: No    Emotionally Abused: No    Physically Abused: No    Sexually Abused: No     Allergies  Allergen Reactions   Lisinopril  Anaphylaxis   Lisinopril -Hydrochlorothiazide  Swelling and Other (See Comments)    Angioedema  Has tolerated maxide in past so most likely  The ACE inhibitor as the cause      Outpatient Medications Prior to Visit  Medication Sig Dispense Refill   amLODipine  (NORVASC ) 10  MG tablet Take 1 tablet (10 mg total) by mouth daily. 90 tablet 0   certolizumab pegol  (CIMZIA ) 2 X 200 MG KIT Inject 400 mg into the skin every 28 (twenty-eight) days.     furosemide (LASIX) 40 MG tablet Take 1 tablet (40 mg total) by mouth daily. 30 tablet 2   HYDROcodone  bit-homatropine (HYCODAN) 5-1.5 MG/5ML syrup Take 5 mLs by mouth every 6 (six) hours as needed for cough. 120 mL 0   Levonorgestrel (KYLEENA) 19.5 MG IUD 19.5 mg by Intrauterine route once.     NON FORMULARY Pt uses cpap nightly  prednisoLONE  acetate (PRED FORTE ) 1 % ophthalmic suspension Place 1 drop into the left eye 2 (two) times daily. As needed     predniSONE  (DELTASONE ) 10 MG tablet Take 2 tablets (20 mg total) by mouth daily for 5 days, THEN 1 tablet (10 mg total) daily for 5 days, THEN 0.5 tablets (5 mg total) daily for 5 days. 18 tablet 0   SUMAtriptan  (TOSYMRA ) 10 MG/ACT SOLN Place 10 mg into the nose once as needed for up to 1 dose. May repeat after 1 hour.  Maximum 2 sprays in 24 hours. 6 each 11   cetirizine  (ZYRTEC ) 10 MG tablet Take 10 mg by mouth daily as needed for allergies. (Patient not taking: Reported on 07/09/2024)     clotrimazole-betamethasone (LOTRISONE) cream SMARTSIG:Topical Morning-Evening (Patient not taking: Reported on 07/09/2024)     diazepam  (VALIUM ) 5 MG tablet Take 2.5-5 mg by mouth at bedtime as needed for sedation. (Patient not taking: Reported on 07/09/2024)     No facility-administered medications prior to visit.    ROS Reviewed all systems and reported negative except as above     Objective:   Vitals:   07/09/24 0907  Weight: 273 lb 12.8 oz (124.2 kg)  Height: 5' 4 (1.626 m)    Physical Exam Constitutional:      Appearance: She is obese.  HENT:     Mouth/Throat:     Mouth: Mucous membranes are moist.  Cardiovascular:     Rate and Rhythm: Normal rate and regular rhythm.  Pulmonary:     Comments: Decreased breath sounds on the right Abdominal:     General: Abdomen  is flat.  Musculoskeletal:        General: Normal range of motion.  Skin:    General: Skin is warm.     Capillary Refill: Capillary refill takes less than 2 seconds.  Neurological:     General: No focal deficit present.     Mental Status: She is alert.       CBC    Component Value Date/Time   WBC 5.4 06/25/2024 0529   RBC 3.81 (L) 06/25/2024 0529   HGB 11.3 (L) 06/25/2024 0529   HGB 11.9 06/11/2018 1212   HCT 36.9 06/25/2024 0529   HCT 36.6 06/11/2018 1212   PLT 316 06/25/2024 0529   PLT 310 06/11/2018 1212   MCV 96.9 06/25/2024 0529   MCV 93 06/11/2018 1212   MCH 29.7 06/25/2024 0529   MCHC 30.6 06/25/2024 0529   RDW 14.3 06/25/2024 0529   RDW 14.4 06/11/2018 1212   LYMPHSABS 2.7 06/25/2024 0529   LYMPHSABS 2.3 06/11/2018 1212   MONOABS 0.6 06/25/2024 0529   EOSABS 0.0 06/25/2024 0529   EOSABS 0.2 06/11/2018 1212   BASOSABS 0.0 06/25/2024 0529   BASOSABS 0.0 06/11/2018 1212     Chest imaging:  PFT:     No data to display          Labs:    Echo:       Assessment & Plan:   Assessment and Plan Assessment & Plan Right pleural effusion likely secondary to rheumatoid arthritis  Right exudative pleural effusion with lymphocyte predominance concerning for rheumatoid arthritis.  Glucose level was not checked.  Cultures were negative.  Differential diagnosis also includes pleural TB.  She does not have any evidence of active pulmonary TB but has a positive QuantiFERON test and she is on immunosuppressive therapy for her rheumatoid arthritis.  A bedside ultrasound was performed today which showed presence  of fluid with a pocket for thoracentesis.  Advised to perform another drainage to completely drain the space out and ensure no lung entrapment in the future.  Given that she also has some mediastinal lymphadenopathy which could be reactive, plan to repeat a CT scan of the chest in 1 month.  I will recheck her QuantiFERON gold test today.  I will also place in a  referral to infectious disease given her positive latent TB quant gold test with someone who definitely needs treatment for latent TB if this is the case given her rheumatoid arthritis disease diagnosis   - Refer to interventional radiology for outpatient thoracentesis.  I also ordered and adenosine deaminase test to be collected with when they drained the pleural fluid - Order CT scan post-drainage to evaluate lymph nodes and lung expansion. - Test drained fluid for bacteria, cancer cells, and glucose levels.  Chronic cough and dyspnea Persistent cough and dyspnea likely related to residual pleural effusion. - Monitor symptoms and advise to report any worsening of cough or dyspnea.  Enlarged intrathoracic lymph nodes Enlarged intrathoracic lymph nodes noted on CT scan. Differential includes infection, inflammation, or malignancy. - Order repeat CT scan in late November to reassess lymph node size and lung condition.  Latent tuberculosis infection (positive QuantiFERON) Positive QuantiFERON test indicating latent tuberculosis infection. No active TB signs on CT scan. - Repeat QuantiFERON test to confirm latent TB infection. - Refer to infectious disease specialist for latent TB management.  Rheumatoid arthritis Rheumatoid arthritis well-controlled with Cimzia . Positive QuantiFERON test complicates continued use due to TB risk. - Advise to hold Cimzia  injection until TB status is clarified. - Coordinate with rheumatologist regarding potential medication adjustments based on TB status and pleural effusion recurrence.        Zola Herter, MD [facility] Office: [phone]

## 2024-07-10 ENCOUNTER — Other Ambulatory Visit: Payer: Self-pay

## 2024-07-10 ENCOUNTER — Telehealth: Payer: Self-pay | Admitting: *Deleted

## 2024-07-10 NOTE — Telephone Encounter (Signed)
 Dr. Zaida- the thora referral needs to be changed to an order. The scheduling team can not see it when we enter as a referral. I am happy to help with placing new order, but I need to know if you want labs ran on the fluid. Please advise, thanks!

## 2024-07-10 NOTE — Telephone Encounter (Signed)
 Left detailed message last 3 cxr no mention of active TB. Patient was hospitalized and had a positive quantiferon gold 07/01/24. 2 view cxr has been ordered but to be performed after thoracentesis performed.

## 2024-07-10 NOTE — Telephone Encounter (Signed)
 Disregard below  The order was correct  PCC's will you please call the pt to schedule this thanks

## 2024-07-11 NOTE — Telephone Encounter (Signed)
 I'm not clear on what needs to be scheduled. Please advise.

## 2024-07-11 NOTE — Telephone Encounter (Signed)
 How long should her lifting restrictions go until.

## 2024-07-11 NOTE — Telephone Encounter (Signed)
Please advise on extended note. 

## 2024-07-12 LAB — QUANTIFERON-TB GOLD PLUS
Mitogen-NIL: 9.93 [IU]/mL
NIL: 0.07 [IU]/mL
QuantiFERON-TB Gold Plus: POSITIVE — AB
TB1-NIL: 9.48 [IU]/mL
TB2-NIL: 9.59 [IU]/mL

## 2024-07-14 ENCOUNTER — Other Ambulatory Visit: Payer: Self-pay

## 2024-07-14 ENCOUNTER — Telehealth: Payer: Self-pay | Admitting: *Deleted

## 2024-07-14 ENCOUNTER — Ambulatory Visit (HOSPITAL_COMMUNITY)
Admission: RE | Admit: 2024-07-14 | Discharge: 2024-07-14 | Disposition: A | Discharge status: Home / Self Care | Source: Ambulatory Visit

## 2024-07-14 ENCOUNTER — Encounter (HOSPITAL_BASED_OUTPATIENT_CLINIC_OR_DEPARTMENT_OTHER): Payer: Self-pay

## 2024-07-14 ENCOUNTER — Ambulatory Visit (HOSPITAL_COMMUNITY)
Admission: RE | Admit: 2024-07-14 | Discharge: 2024-07-14 | Disposition: A | Source: Ambulatory Visit | Attending: Radiology | Admitting: Radiology

## 2024-07-14 ENCOUNTER — Other Ambulatory Visit: Payer: Self-pay | Admitting: Neurology

## 2024-07-14 ENCOUNTER — Telehealth: Payer: Self-pay | Admitting: Neurology

## 2024-07-14 DIAGNOSIS — M0520 Rheumatoid vasculitis with rheumatoid arthritis of unspecified site: Secondary | ICD-10-CM | POA: Insufficient documentation

## 2024-07-14 DIAGNOSIS — G43009 Migraine without aura, not intractable, without status migrainosus: Secondary | ICD-10-CM

## 2024-07-14 DIAGNOSIS — R7612 Nonspecific reaction to cell mediated immunity measurement of gamma interferon antigen response without active tuberculosis: Secondary | ICD-10-CM | POA: Diagnosis not present

## 2024-07-14 DIAGNOSIS — J90 Pleural effusion, not elsewhere classified: Secondary | ICD-10-CM | POA: Insufficient documentation

## 2024-07-14 HISTORY — PX: IR THORACENTESIS ASP PLEURAL SPACE W/IMG GUIDE: IMG5380

## 2024-07-14 LAB — BODY FLUID CELL COUNT WITH DIFFERENTIAL
Eos, Fluid: 0 %
Lymphs, Fluid: 63 %
Monocyte-Macrophage-Serous Fluid: 36 % — ABNORMAL LOW (ref 50–90)
Neutrophil Count, Fluid: 1 % (ref 0–25)
Total Nucleated Cell Count, Fluid: 7352 uL — ABNORMAL HIGH (ref 0–1000)

## 2024-07-14 LAB — PROTEIN, PLEURAL OR PERITONEAL FLUID: Total protein, fluid: 6.4 g/dL

## 2024-07-14 LAB — GRAM STAIN

## 2024-07-14 LAB — AMYLASE, PLEURAL OR PERITONEAL FLUID: Amylase, Fluid: 66 U/L

## 2024-07-14 LAB — LACTATE DEHYDROGENASE, PLEURAL OR PERITONEAL FLUID: LD, Fluid: 272 U/L — ABNORMAL HIGH (ref 3–23)

## 2024-07-14 LAB — GLUCOSE, PLEURAL OR PERITONEAL FLUID: Glucose, Fluid: 131 mg/dL

## 2024-07-14 MED ORDER — LIDOCAINE-EPINEPHRINE 1 %-1:100000 IJ SOLN
20.0000 mL | Freq: Once | INTRAMUSCULAR | Status: AC
  Administered 2024-07-14: 10 mL via INTRADERMAL

## 2024-07-14 MED ORDER — LIDOCAINE-EPINEPHRINE 1 %-1:100000 IJ SOLN
INTRAMUSCULAR | Status: AC
Start: 2024-07-14 — End: 2024-07-14
  Filled 2024-07-14: qty 1

## 2024-07-14 MED ORDER — TOSYMRA 10 MG/ACT NA SOLN
10.0000 mg | Freq: Once | NASAL | 11 refills | Status: DC | PRN
Start: 2024-07-14 — End: 2024-07-21

## 2024-07-14 NOTE — Telephone Encounter (Signed)
 Patient has had 2 positive TB Gold test. Patient will have to wait to start Rasuvo  or Arava.   Reached out to patient to cancel her Cimzia  injection for 07/17/2024. Per Dr. Dolphus patient may restart 1 month after she has been on treatment for latent TB. Patient verbalized understanding.

## 2024-07-14 NOTE — Telephone Encounter (Signed)
-----   Message from [facility] sent at 07/01/2024  9:04 AM EDT ----- Pending creatinine, patient will be Rasuvo  or leflunomide new start.  If elevated creatinine will be leflunomide new start. IF creatinine normalized, will proceed with Rasuvo . Please notify pharmacy team if Rasuvo  is being initiated so we can run prior authorization. Thanks!

## 2024-07-14 NOTE — Telephone Encounter (Signed)
 This goes by mail order

## 2024-07-14 NOTE — Procedures (Signed)
 Ultrasound-guided diagnostic and therapeutic right sided thoracentesis performed yielding 600 milliliters of straw colored fluid. No immediate complications.   Diagnostic fluid was sent to the lab for further analysis. Follow-up chest x-ray pending. EBL is < 2 ml.

## 2024-07-14 NOTE — Telephone Encounter (Signed)
 Patient advised, Per Dr.Hill  [facility] last until her appt with Dr.Jaffe in January.

## 2024-07-14 NOTE — Telephone Encounter (Signed)
 Patient needs a refill on the Sumatriptan  medication. She is down to her last couple of pills. She was sch for the10-29-25 and we had to move appt 09-24-23 due to a change in  Sch

## 2024-07-15 ENCOUNTER — Inpatient Hospital Stay: Admission: RE | Admit: 2024-07-15 | Discharge: 2024-07-15 | Disposition: A | Source: Ambulatory Visit

## 2024-07-15 DIAGNOSIS — J90 Pleural effusion, not elsewhere classified: Secondary | ICD-10-CM

## 2024-07-15 DIAGNOSIS — M0520 Rheumatoid vasculitis with rheumatoid arthritis of unspecified site: Secondary | ICD-10-CM

## 2024-07-15 DIAGNOSIS — R7612 Nonspecific reaction to cell mediated immunity measurement of gamma interferon antigen response without active tuberculosis: Secondary | ICD-10-CM

## 2024-07-15 LAB — TRIGLYCERIDES, BODY FLUIDS: Triglycerides, Fluid: 18 mg/dL

## 2024-07-15 MED ORDER — IOPAMIDOL (ISOVUE-300) INJECTION 61%
75.0000 mL | Freq: Once | INTRAVENOUS | Status: AC | PRN
  Administered 2024-07-15: 75 mL via INTRAVENOUS

## 2024-07-16 ENCOUNTER — Telehealth: Payer: Self-pay | Admitting: *Deleted

## 2024-07-16 ENCOUNTER — Ambulatory Visit: Admitting: Neurology

## 2024-07-16 LAB — ACID FAST SMEAR (AFB, MYCOBACTERIA): Acid Fast Smear: NEGATIVE

## 2024-07-16 NOTE — Telephone Encounter (Signed)
 Received return to work forms via mychart from patient for Dr. Zaida to complete.  Patient was made aware of fee of $29 for forms to be completed as well as allowing 10-14 business days for them to be completed.  She stated she will come by with the fee.

## 2024-07-16 NOTE — Telephone Encounter (Signed)
 Dr. Zaida, Patient has return to work forms to be completed.  They have been placed in your box for completion.  Thank you.

## 2024-07-17 ENCOUNTER — Ambulatory Visit

## 2024-07-17 LAB — CHOLESTEROL, BODY FLUID: Cholesterol, Fluid: 104 mg/dL

## 2024-07-18 ENCOUNTER — Telehealth: Payer: Self-pay | Admitting: Neurology

## 2024-07-18 LAB — CYTOLOGY - NON PAP

## 2024-07-18 NOTE — Telephone Encounter (Signed)
 Who's calling (name and relationship to patient) : Garrel; pharmacist  Best contact number: [phone]  Provider they see: Dr. Skeet  Reason for call: Caller states he is needing clarification on nasal medication Ref: [id number]   9-5pm

## 2024-07-18 NOTE — Telephone Encounter (Signed)
 Who's calling (name and relationship to patient) :Montine Moose; [facility]   Best contact number: [phone]  Provider they see: Dr. Leigh  Reason for call: Shyniece Lvm regarding Nasal spray. She stated pt's plan does not cover certain meds. The alternative would be Sumatriptan  5 or 20 mg.  She stated when contacted back please proved Reference # with Prescriber name and pt name and d.o.b. This is time sensitive.  Reference # [id number]   Call ID:      PRESCRIPTION REFILL ONLY  Name of prescription:  Pharmacy:

## 2024-07-19 LAB — CULTURE, BODY FLUID W GRAM STAIN -BOTTLE: Culture: NO GROWTH

## 2024-07-21 ENCOUNTER — Other Ambulatory Visit: Payer: Self-pay

## 2024-07-21 ENCOUNTER — Encounter: Payer: Self-pay | Admitting: Rheumatology

## 2024-07-21 ENCOUNTER — Encounter: Payer: Self-pay | Admitting: Internal Medicine

## 2024-07-21 ENCOUNTER — Telehealth: Payer: Self-pay

## 2024-07-21 ENCOUNTER — Other Ambulatory Visit (HOSPITAL_COMMUNITY): Payer: Self-pay

## 2024-07-21 ENCOUNTER — Ambulatory Visit: Admitting: Neurology

## 2024-07-21 ENCOUNTER — Ambulatory Visit (INDEPENDENT_AMBULATORY_CARE_PROVIDER_SITE_OTHER): Admitting: Internal Medicine

## 2024-07-21 ENCOUNTER — Other Ambulatory Visit: Payer: Self-pay | Admitting: Neurology

## 2024-07-21 VITALS — BP 134/87 | HR 89 | Temp 98.2°F | Resp 16 | Wt 260.0 lb

## 2024-07-21 DIAGNOSIS — R7612 Nonspecific reaction to cell mediated immunity measurement of gamma interferon antigen response without active tuberculosis: Secondary | ICD-10-CM | POA: Diagnosis not present

## 2024-07-21 LAB — COMPLETE METABOLIC PANEL WITHOUT GFR
AG Ratio: 0.7 (calc) — ABNORMAL LOW (ref 1.0–2.5)
ALT: 13 U/L (ref 6–29)
AST: 15 U/L (ref 10–35)
Albumin: 3.9 g/dL (ref 3.6–5.1)
Alkaline phosphatase (APISO): 72 U/L (ref 31–125)
BUN: 15 mg/dL (ref 7–25)
CO2: 27 mmol/L (ref 20–32)
Calcium: 9.1 mg/dL (ref 8.6–10.2)
Chloride: 101 mmol/L (ref 98–110)
Creat: 0.94 mg/dL (ref 0.50–0.99)
Globulin: 5.3 g/dL — ABNORMAL HIGH (ref 1.9–3.7)
Glucose, Bld: 79 mg/dL (ref 65–99)
Potassium: 3.7 mmol/L (ref 3.5–5.3)
Sodium: 138 mmol/L (ref 135–146)
Total Bilirubin: 0.5 mg/dL (ref 0.2–1.2)
Total Protein: 9.2 g/dL — ABNORMAL HIGH (ref 6.1–8.1)

## 2024-07-21 LAB — CBC WITH DIFFERENTIAL/PLATELET
Absolute Lymphocytes: 3853 {cells}/uL (ref 850–3900)
Absolute Monocytes: 749 {cells}/uL (ref 200–950)
Basophils Absolute: 18 {cells}/uL (ref 0–200)
Basophils Relative: 0.3 %
Eosinophils Absolute: 47 {cells}/uL (ref 15–500)
Eosinophils Relative: 0.8 %
HCT: 38.7 % (ref 35.0–45.0)
Hemoglobin: 12.5 g/dL (ref 11.7–15.5)
MCH: 30.4 pg (ref 27.0–33.0)
MCHC: 32.3 g/dL (ref 32.0–36.0)
MCV: 94.2 fL (ref 80.0–100.0)
MPV: 10.5 fL (ref 7.5–12.5)
Monocytes Relative: 12.7 %
Neutro Abs: 1233 {cells}/uL — ABNORMAL LOW (ref 1500–7800)
Neutrophils Relative %: 20.9 %
Platelets: 239 Thousand/uL (ref 140–400)
RBC: 4.11 Million/uL (ref 3.80–5.10)
RDW: 12.5 % (ref 11.0–15.0)
Total Lymphocyte: 65.3 %
WBC: 5.9 Thousand/uL (ref 3.8–10.8)

## 2024-07-21 MED ORDER — RIFAMPIN 300 MG PO CAPS
600.0000 mg | ORAL_CAPSULE | Freq: Every day | ORAL | 5 refills | Status: AC
  Filled 2024-07-21: qty 60, 30d supply, fill #0
  Filled 2024-08-18: qty 60, 30d supply, fill #1
  Filled 2024-09-24: qty 60, 30d supply, fill #2
  Filled 2024-10-23: qty 60, 30d supply, fill #3

## 2024-07-21 MED ORDER — SUMATRIPTAN 20 MG/ACT NA SOLN: 20.0000 mg | NASAL | 1 refills | Status: DC | PRN

## 2024-07-21 NOTE — Telephone Encounter (Signed)
 Who's calling (name and relationship to patient) :Montine Moose; [facility] [facility]    Best contact number: [phone]   Provider they see: Dr. Leigh   Reason for call: Shyniece Lvm regarding Nasal spray. She stated pt's plan does not cover certain meds. The alternative would be Sumatriptan  5 or 20 mg.  She stated when contacted back please proved Reference # with Prescriber name and pt name and d.o.b. This is time sensitive.   Reference # [id number]     Call ID:

## 2024-07-21 NOTE — Progress Notes (Signed)
 Patient: Jennifer [name]  DOB: [birth year] MRN: [id number] PCP: Charlett Apolinar POUR, MD    Chief Complaint  Patient presents with   New Patient (Initial Visit)    Postive TB test - having SOB but thinks it due to pleural effusion.      Patient Active Problem List   Diagnosis Date Noted   Pleural effusion 06/23/2024   Hidradenitis suppurativa 11/23/2022   OSA (obstructive sleep apnea) 02/23/2022   Morbid obesity (HCC) 02/23/2022   History of total knee replacement, right 07/22/2021   Primary osteoarthritis of right knee 03/28/2021   OA (osteoarthritis) of knee 04/02/2020   Pain in right knee 01/13/2020   Chronic pain of right knee 01/21/2019   S/P right knee arthroscopy 07/23/2018   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   Primary osteoarthritis of both hands 02/04/2018   Sleep apnea, obstructive 09/28/2017   Primary osteoarthritis of both knees 05/22/2017   Chronic migraine without aura without status migrainosus, not intractable 01/18/2017   Incisional hernia 12/18/2016   Leiomyoma of uterus 09/06/2016   Anxiety disorder 03/29/2016   Depressive disorder 03/29/2016   Fibroid, uterine    Essential hypertension 05/25/2015   Recurrent headache 05/25/2015   Head lump 07/31/2014   Edema 12/29/2013   Baker's cyst, ruptured 12/25/2013   Cough, persistent 12/12/2013   Left leg swelling 12/12/2013   Cough 12/12/2013   High risk medication use 12/12/2013   Recurrent periodic urticaria    Acne vulgaris 11/21/2012   Postinflammatory hyperpigmentation 11/21/2012   Rheumatoid arthritis (HCC) 08/28/2012   CHRONIC LARYNGITIS 11/03/2010   Allergic rhinitis 11/03/2010   PARESTHESIA 07/28/2010   DYSMENORRHEA 10/15/2007   FIBROIDS, UTERUS 07/24/2007   OBESITY 07/24/2007   SLEEPLESSNESS 07/24/2007   HEADACHE 07/24/2007     Subjective:  Jennifer [name] is a 48 y.o. F with past medical history of otitis media, anxiety/depression, recurrent breast abscess, GERD, migraines, RA presents for  management of Positive QuantiFERON QuantiFERON was positive on 10/14 and 10/22.  Has been negative in the past last negative was 07/09/2023.  She is followed by rheumatology Dr. Marea for 4 rheumatoid arthritis at multiple sites and iritis, well-controlled on Cimzia  injections.  She developed a nonproductive cough about 2 weeks ago found to have a pleural effusion underwent thoracentesis with negative cultures of note AFB culture x 2 negative.  She is following pulmonology as well her effusion likely secondary to rheumatoid arthritis.  Sent to ID for management of latent TB. Today denies any exposure to TB, reports traveling to [place] as a resort.  She has not worked at a skilled facility although she does have a healthcare exposure her medical history.  Review of Systems  All other systems reviewed and are negative.   Past Medical History:  Diagnosis Date   Acute otitis media 08/28/2012   improved  change to liquid medication    Anxiety disorder 03/29/2016   Breast abscess of female    Recurrent   Depressive disorder 03/29/2016   Family history of adverse reaction to anesthesia    father hard time waking once (12/20/2016)   Fibroids    w/bleeding   GERD (gastroesophageal reflux disease)    History of blood transfusion    after surgery   Hypertension    Migraines    Dr. Layman Meissner; I have a few/month (12/20/2016)   Osteoarthritis    Pill esophagitis 08/28/2012   amoxicillin   by hx  disc plan nl voice except hoarse not drooling  close fu  if not getting better with plan stop aleve   liquid ibu onlyf necessary    Recurrent periodic urticaria    Rheumatoid arthritis (HCC)    55% of my body (12/20/2016)   Rheumatoid arthritis (HCC)    Sleep apnea    wears cpap    Outpatient Medications Prior to Visit  Medication Sig Dispense Refill   amLODipine  (NORVASC ) 10 MG tablet Take 1 tablet (10 mg total) by mouth daily. 90 tablet 0   certolizumab pegol  (CIMZIA ) 2 X 200 MG KIT Inject 400 mg  into the skin every 28 (twenty-eight) days.     cetirizine  (ZYRTEC ) 10 MG tablet Take 10 mg by mouth daily as needed for allergies.     furosemide (LASIX) 40 MG tablet Take 1 tablet (40 mg total) by mouth daily. 30 tablet 2   HYDROcodone  bit-homatropine (HYCODAN) 5-1.5 MG/5ML syrup Take 5 mLs by mouth every 6 (six) hours as needed for cough. 120 mL 0   Levonorgestrel (KYLEENA) 19.5 MG IUD 19.5 mg by Intrauterine route once.     NON FORMULARY Pt uses cpap nightly     prednisoLONE  acetate (PRED FORTE ) 1 % ophthalmic suspension Place 1 drop into the left eye 2 (two) times daily. As needed     predniSONE  (DELTASONE ) 10 MG tablet Take by mouth.     SUMAtriptan  (TOSYMRA ) 10 MG/ACT SOLN Place 10 mg into the nose once as needed for up to 1 dose. May repeat after 1 hour.  Maximum 2 sprays in 24 hours. 6 each 11   clotrimazole-betamethasone (LOTRISONE) cream SMARTSIG:Topical Morning-Evening (Patient not taking: Reported on 07/21/2024)     diazepam  (VALIUM ) 5 MG tablet Take 2.5-5 mg by mouth at bedtime as needed for sedation. (Patient not taking: Reported on 07/21/2024)     No facility-administered medications prior to visit.     Allergies  Allergen Reactions   Lisinopril  Anaphylaxis   Lisinopril -Hydrochlorothiazide  Swelling and Other (See Comments)    Angioedema  Has tolerated maxide in past so most likely  The ACE inhibitor as the cause     Social History   Tobacco Use   Smoking status: Never    Passive exposure: Never   Smokeless tobacco: Never  Vaping Use   Vaping status: Never Used  Substance Use Topics   Alcohol use: Yes    Comment: social   Drug use: Never    Family History  Problem Relation Age of Onset   Hypertension Mother    Rheum arthritis Mother    Hypertension Father    Sleep apnea Father    Breast cancer Neg Hx    Colon cancer Neg Hx    Colon polyps Neg Hx    Esophageal cancer Neg Hx    Rectal cancer Neg Hx    Stomach cancer Neg Hx     Objective:   Vitals:    07/21/24 1256  BP: 134/87  Pulse: 89  Resp: 16  Temp: 98.2 F (36.8 C)  TempSrc: Temporal  SpO2: 98%  Weight: 260 lb (117.9 kg)   Body mass index is 44.63 kg/m.  Physical Exam Constitutional:      Appearance: Normal appearance.  HENT:     Head: Normocephalic and atraumatic.     Right Ear: Tympanic membrane normal.     Left Ear: Tympanic membrane normal.     Nose: Nose normal.     Mouth/Throat:     Mouth: Mucous membranes are moist.  Eyes:     Extraocular Movements: Extraocular movements intact.  Conjunctiva/sclera: Conjunctivae normal.     Pupils: Pupils are equal, round, and reactive to light.  Cardiovascular:     Rate and Rhythm: Normal rate and regular rhythm.     Heart sounds: No murmur heard.    No friction rub. No gallop.  Pulmonary:     Effort: Pulmonary effort is normal.     Breath sounds: Normal breath sounds.  Abdominal:     General: Abdomen is flat.     Palpations: Abdomen is soft.  Musculoskeletal:        General: Normal range of motion.  Skin:    General: Skin is warm and dry.  Neurological:     General: No focal deficit present.     Mental Status: She is alert and oriented to person, place, and time.  Psychiatric:        Mood and Affect: Mood normal.     Lab Results: Lab Results  Component Value Date   WBC 5.4 06/25/2024   HGB 11.3 (L) 06/25/2024   HCT 36.9 06/25/2024   MCV 96.9 06/25/2024   PLT 316 06/25/2024    Lab Results  Component Value Date   CREATININE 0.96 07/01/2024   BUN 15 07/01/2024   NA 137 07/01/2024   K 3.7 07/01/2024   CL 98 07/01/2024   CO2 28 07/01/2024    Lab Results  Component Value Date   ALT 36 (H) 07/01/2024   AST 41 (H) 07/01/2024   ALKPHOS 84 06/25/2024   BILITOT 0.4 07/01/2024     Assessment & Plan:  #IGRA positive x 2-latent TB #Rheumatoid arthritis On Cimzia , currently held #Pleural effusion s/p thoracentesis with negative AFB-followed by pulmonology - Patient had negative QuantiFERON till  October 24.  She went to [place] in June.  At end of September she developed a cough, at 2 undergo thoracentesis and was hospitalized.  Thought to be secondary to rheumatoid arthritis. - CT chest on 07/15/2024 showed small pleural effusion ditching sides.  Enlarged mediastinal and right hilar lymph nodes. - She is followed by pulmonology and has undergone paracentesis x 2 with negative AFB on 10/7, 10/27 Plan: - Plan to treat with rifampin x 4 months.  Labs today. - Met with pharmacy. -Follow-up with ID in 2 months  Loney Stank, MD [facility]   07/21/24  1:05 PM I have personally spent 65 minutes involved in face-to-face and non-face-to-face activities for this patient on the day of the visit. Professional time spent includes the following activities: Preparing to see the patient (review of tests), Obtaining and/or reviewing separately obtained history (admission/discharge record), Performing a medically appropriate examination and/or evaluation , Ordering medications/tests/procedures, referring and communicating with other health care professionals, Documenting clinical information in the EMR, Independently interpreting results (not separately reported), Communicating results to the patient/family/caregiver, Counseling and educating the patient/family/caregiver and Care coordination (not separately reported).

## 2024-07-21 NOTE — Telephone Encounter (Signed)
 Counseled patient on rifampin which included the following:  - Take 600 mg rifampin daily on an empty stomach (1 hour before or 2 hours after food) - Separate from antacids by 2 hours  - Rifampin may decrease the effectiveness of amlodipine  (patient counseled to monitor blood pressure daily and to reach out to PCP for significantly high BP trends) and levonorgestrel (counseled on using additional back up methods such as condoms) - Potential for red-colored urine  - Reach out with any questions/concerns   Of note, patient inquiring about pain medication to help with rheumatoid arthritis in the setting of being off certolizumab pegol  (CIMZIA ) injections during rifampin treatment and PCP directing the patient to avoid ibuprofen  and acetaminophen . After further review, I told the patient that there are no significant drug-interactions between rifampin and ibuprofen /acetaminophen  and to reach out to Rheumatology and PCP for pain management recommendations.   Thank you,   Feliciano Close, PharmD PGY2 Infectious Diseases Pharmacy Resident

## 2024-07-22 ENCOUNTER — Other Ambulatory Visit (HOSPITAL_COMMUNITY): Payer: Self-pay

## 2024-07-22 ENCOUNTER — Telehealth: Payer: Self-pay | Admitting: Pharmacy Technician

## 2024-07-22 NOTE — Telephone Encounter (Signed)
 Pharmacy Patient Advocate Encounter   Received notification from Pt Calls Messages that prior authorization for SUMATRIPTAN  20MG  NASAL SPR is required/requested.   Insurance verification completed.   The patient is insured through [employer].   Per test claim: PA required; PA submitted to above mentioned insurance via Latent Key/confirmation #/EOC [id number] Status is pending

## 2024-07-22 NOTE — Telephone Encounter (Signed)
 The [employer] of rheumatology recommendations are to resume Cimzia  after 1 month of rifampin treatment.  Your liver functions and kidney functions have been elevated in the past.  You should be able to take Tylenol  as needed.

## 2024-07-22 NOTE — Telephone Encounter (Signed)
 PA has been submitted, and telephone encounter has been created. Please see telephone encounter dated 11.4.25.

## 2024-07-22 NOTE — Telephone Encounter (Signed)
 So I am not aware of any drug interactions with your med list  but     pharmacist should check   list to be sure no problems with adding this med  ( but this should have been done when ordered by dr Dennise)

## 2024-07-22 NOTE — Telephone Encounter (Signed)
 Pharmacy Patient Advocate Encounter  Received notification from [facility] that Prior Authorization for SUMATRIPTAN  20MG  NASAL SPR has been CANCELLED due to    TEST BILLING WITH [facility] RETURNS A $0 COPAY

## 2024-07-22 NOTE — Telephone Encounter (Signed)
 Called and left a detailed message on voice mail. Told to call if questions or concerns

## 2024-07-24 NOTE — Progress Notes (Unsigned)
 NEUROLOGY FOLLOW UP OFFICE NOTE  Jennifer [name] [id number]  Assessment/Plan:   Migraine without aura, without status migrainosus, not intractable Hypertension   Migraine prevention:  Start propranolol ER 60mg  daily (since it may help with blood pressure and I want to avoid CGRP inhibitor while she is on another medication that can elevated blood pressure).  If no improvement in 4 weeks, would increase to 80mg  daily Migraine rescue:  sumatriptan  20mg  NS Lifestyle modification: Limit use of pain relievers to no more than 9 days out of the month to prevent risk of rebound or medication-overuse headache. Diet modification/hydration/caffeine cessation Routine exercise Sleep hygiene Consider vitamins/supplements:  magnesium citrate 400mg  daily, riboflavin 400mg  daily, CoQ10 100mg  three times daily Keep headache diary Follow up 7 months.   Subjective:  Jennifer [name] is a 48 year old female with rheumatoid arthritis who follows up for migraines.  UPDATE: Last seen in January 2024.  Insurance would not cover Tosymra .  On generic sumatriptan  NS. Migraines controlled in 2024.  They have increased in 2025.  Over the past month, they have been 3 times a week. Intensity:  4/10 Duration:  usually within 1 hour with sumatriptan  NS, twice she needed to repeat dose Frequency:  3 times a week.   Blood pressure has been elevated.  She is currently on a medication for 4 months that may raise blood pressure.  Current NSAIDS/analgesics:  Excedrin Migraine, hydrocodone  Current triptans:  sumatriptan  20mg  NS Current ergotamine:  none Current anti-emetic:  none Current muscle relaxants:  Robaxin  Current anti-anxiolytic:  none Current sleep aide:  Melatonin 10mg  PRN Current Antihypertensive medications:  amlodipine  Current Antidepressant medications:  none Current Anticonvulsant medications:  none Current anti-CGRP:  none Current Antihistamines/Decongestants:  Zyrtec  Other therapy:   none Hormone/birth control:  Kyleena Other medications:  Cimzia , rifampin  Caffeine:  1 to 2 cups of coffee daily.  Sometimes at 4 PM.  No soda or tea Diet:  No soda.  Four 16 oz bottles of water  daily.   Depression:  no; Anxiety:  Yes (family-related Other pain:  arthritic pain in her other knee Sleep hygiene:  Difficulty falling asleep and staying asleep because she keeps thinking about a lot of things.  Has OSA and uses CPAP.  HISTORY:  She started having migraines in her late-20s to early-30s.  They are severe mid-frontal/bitemporal/back of neck throbbing pain.  They are associated with seeing white spots when severe, photophobia, phonophobia, rarely nausea..  Her migraines typically last 2 hours to 2 days and occur 2 to 3 times a month.  Triggers include stress and fluorescent lights.     She developed a migraine on 09/18/2019, however it was persistent.  She was subsequently diagnosed with Covid a few days later.  The migraine did not break.  She was prescribed a prednisone  taper on 10/08/2019 and the migraine broke.  She currently reports increased family-related stress.        Past analgesics/NSAIDS/ steroids:  Ibuprofen  800mg , naproxen , prednisone , Tramadol  Past abortive triptans: Maxalt  MLT 10mg ; Tosymra  NS (helpful, trouble with coverage), eletriptan  Past abortive ergotamine:  none Past muscle relaxants:  Flexeril  Past anti-emetic:  none Past antihypertensive medications:  Labetolol; lisinopril -hydrochlorothiazide , furosemide Past antidepressant medications:  Doxepin  10mg  BID (hives); fluoxetine 10mg  Past anticonvulsant medications:  zonisamide 100mg  daily; topiramate (stopped due to potential memory problems), gabapentin  Past anti-CGRP:  Ajovy  (helpful but insurance stopped approving) Past vitamins/Herbal/Supplements:  none Past antihistamines/decongestants:  none Other past therapies:  none    Family history of  headache:  Aunt (used to have migraines)  PAST MEDICAL  HISTORY: Past Medical History:  Diagnosis Date   Acute otitis media 08/28/2012   improved  change to liquid medication    Anxiety disorder 03/29/2016   Breast abscess of female    Recurrent   Depressive disorder 03/29/2016   Family history of adverse reaction to anesthesia    father hard time waking once (12/20/2016)   Fibroids    w/bleeding   GERD (gastroesophageal reflux disease)    History of blood transfusion    after surgery   Hypertension    Migraines    Dr. Layman Meissner; I have a few/month (12/20/2016)   Osteoarthritis    Pill esophagitis 08/28/2012   amoxicillin   by hx  disc plan nl voice except hoarse not drooling  close fu  if not getting better with plan stop aleve   liquid ibu onlyf necessary    Recurrent periodic urticaria    Rheumatoid arthritis (HCC)    55% of my body (12/20/2016)   Rheumatoid arthritis (HCC)    Sleep apnea    wears cpap    MEDICATIONS: Current Outpatient Medications on File Prior to Visit  Medication Sig Dispense Refill   amLODipine  (NORVASC ) 10 MG tablet Take 1 tablet (10 mg total) by mouth daily. 90 tablet 0   certolizumab pegol  (CIMZIA ) 2 X 200 MG KIT Inject 400 mg into the skin every 28 (twenty-eight) days.     cetirizine  (ZYRTEC ) 10 MG tablet Take 10 mg by mouth daily as needed for allergies.     clotrimazole-betamethasone (LOTRISONE) cream SMARTSIG:Topical Morning-Evening (Patient not taking: Reported on 07/21/2024)     diazepam  (VALIUM ) 5 MG tablet Take 2.5-5 mg by mouth at bedtime as needed for sedation. (Patient not taking: Reported on 07/21/2024)     furosemide (LASIX) 40 MG tablet Take 1 tablet (40 mg total) by mouth daily. 30 tablet 2   HYDROcodone  bit-homatropine (HYCODAN) 5-1.5 MG/5ML syrup Take 5 mLs by mouth every 6 (six) hours as needed for cough. 120 mL 0   Levonorgestrel (KYLEENA) 19.5 MG IUD 19.5 mg by Intrauterine route once.     NON FORMULARY Pt uses cpap nightly     prednisoLONE  acetate (PRED FORTE ) 1 % ophthalmic  suspension Place 1 drop into the left eye 2 (two) times daily. As needed     predniSONE  (DELTASONE ) 10 MG tablet Take by mouth.     rifampin (RIFADIN) 300 MG capsule Take 2 capsules (600 mg total) by mouth daily. 60 capsule 5   SUMAtriptan  (IMITREX) 20 MG/ACT nasal spray Place 1 spray (20 mg total) into the nose as needed for migraine or headache. May repeat in 2 hours if headache persists or recurs.  Maximum 2 sprays in 24 hours. 1 each 1   [DISCONTINUED] eletriptan  (RELPAX ) 40 MG tablet Take 1 tablet (40 mg total) by mouth as needed for migraine or headache. May repeat in 2 hours if headache persists or recurs. 10 tablet 1   No current facility-administered medications on file prior to visit.      ALLERGIES: Allergies  Allergen Reactions   Lisinopril  Anaphylaxis   Lisinopril -Hydrochlorothiazide  Swelling and Other (See Comments)    Angioedema  Has tolerated maxide in past so most likely  The ACE inhibitor as the cause     FAMILY HISTORY: Family History  Problem Relation Age of Onset   Hypertension Mother    Rheum arthritis Mother    Hypertension Father    Sleep apnea Father  Breast cancer Neg Hx    Colon cancer Neg Hx    Colon polyps Neg Hx    Esophageal cancer Neg Hx    Rectal cancer Neg Hx    Stomach cancer Neg Hx       Objective:  Blood pressure (!) 147/94, pulse 100, height 5' 6 (1.676 m), weight 277 lb 9.6 oz (125.9 kg). General: No acute distress.  Patient appears well-groomed.   Head:  Normocephalic/atraumatic Neck:  Supple.  No paraspinal tenderness.  Full range of motion. Heart:  Regular rate and rhythm. Neuro:  Alert and oriented.  Speech fluent and not dysarthric.  Language intact.  CN II-XII intact.  Bulk and tone normal.  Muscle strength 5/5 throughout.  Sensation to light touch intact.  Deep tendon reflexes 2+ throughout, toes downgoing.  Gait normal.  Romberg negative.    Juliene Dunnings, DO  CC: Apolinar Eastern, MD

## 2024-07-25 ENCOUNTER — Ambulatory Visit (INDEPENDENT_AMBULATORY_CARE_PROVIDER_SITE_OTHER): Admitting: Neurology

## 2024-07-25 ENCOUNTER — Encounter: Payer: Self-pay | Admitting: Neurology

## 2024-07-25 VITALS — BP 147/94 | HR 100 | Ht 66.0 in | Wt 277.6 lb

## 2024-07-25 DIAGNOSIS — G43009 Migraine without aura, not intractable, without status migrainosus: Secondary | ICD-10-CM | POA: Diagnosis not present

## 2024-07-25 MED ORDER — SUMATRIPTAN 20 MG/ACT NA SOLN: 20.0000 mg | NASAL | 1 refills | Status: AC | PRN

## 2024-07-25 MED ORDER — PROPRANOLOL HCL ER 60 MG PO CP24: 60.0000 mg | ORAL_CAPSULE | Freq: Every day | ORAL | 5 refills | Status: AC

## 2024-07-25 NOTE — Patient Instructions (Addendum)
  Start PROPRANOLOL ER 60MG  DAILY.  Contact us  in 4 weeks with update and we can increase dose if needed. Take SUMATRIPTAN  NASAL SPRAY at earliest onset of headache.  May repeat dose once in 2 hours if needed.  Maximum 2 sprays in 24 hours. Limit use of pain relievers to no more than 9 days out of the month.  These medications include acetaminophen , NSAIDs (ibuprofen /Advil /Motrin , naproxen /Aleve , triptans (Imitrex/sumatriptan ), Excedrin, and narcotics.  This will help reduce risk of rebound headaches. Be aware of common food triggers: Routine exercise Stay adequately hydrated (aim for 64 oz water  daily) Keep headache diary Maintain proper stress management Maintain proper sleep hygiene Do not skip meals Consider supplements:  magnesium citrate 400mg  daily, riboflavin 400mg  daily, coenzyme Q10 100mg  three times daily.

## 2024-07-27 NOTE — Progress Notes (Signed)
 Please send an urgent referral for ID so she can be started on the treatment for latent tuberculosis.  Otherwise there will be a delay in  starting Biologics which will lead to disease flare.

## 2024-07-28 ENCOUNTER — Telehealth: Payer: Self-pay

## 2024-07-28 NOTE — Telephone Encounter (Signed)
 Copied from CRM [id number]. Topic: General - Other >> Jul 28, 2024 12:14 PM Celestine FALCON wrote: Reason for CRM: Pt is wanting to know if Dr. Zaida had completed her reutrn to work forms that was submitted via mychart. The encounter from RN Powell Dave Sans stated it took 10-14 business days which would be 07/30/2024 - 08/05/2024 pt is aware.   Pt stated she just needs a note stating she can lift again which she stated can be received by a nurse instead of waiting on this paperwork being completed as long as Dr. Zaida is okay with her lifting again. Please follow up with the pt.  Pt's phone number is [phone] ok to leave a vm.

## 2024-07-30 ENCOUNTER — Other Ambulatory Visit: Payer: Self-pay

## 2024-07-30 NOTE — Telephone Encounter (Signed)
 Spoke with patient she has received communication letter

## 2024-07-30 NOTE — Progress Notes (Signed)
 Return to work without restrictions communication sent to patient  Jennifer Herter, MD [facility] Office: [phone]   See Amion for personal pager PCCM on call pager [phone] until 7pm. Please call Elink 7p-7a. [phone]

## 2024-08-05 ENCOUNTER — Ambulatory Visit (INDEPENDENT_AMBULATORY_CARE_PROVIDER_SITE_OTHER): Admitting: Internal Medicine

## 2024-08-05 ENCOUNTER — Encounter: Payer: Self-pay | Admitting: Internal Medicine

## 2024-08-05 ENCOUNTER — Telehealth: Payer: Self-pay

## 2024-08-05 VITALS — BP 140/84 | HR 72 | Temp 97.8°F | Ht 66.0 in | Wt 283.0 lb

## 2024-08-05 DIAGNOSIS — I10 Essential (primary) hypertension: Secondary | ICD-10-CM

## 2024-08-05 DIAGNOSIS — J90 Pleural effusion, not elsewhere classified: Secondary | ICD-10-CM

## 2024-08-05 DIAGNOSIS — Z79899 Other long term (current) drug therapy: Secondary | ICD-10-CM

## 2024-08-05 DIAGNOSIS — R7612 Nonspecific reaction to cell mediated immunity measurement of gamma interferon antigen response without active tuberculosis: Secondary | ICD-10-CM

## 2024-08-05 DIAGNOSIS — M0579 Rheumatoid arthritis with rheumatoid factor of multiple sites without organ or systems involvement: Secondary | ICD-10-CM

## 2024-08-05 NOTE — Patient Instructions (Addendum)
 Good to see  you today .  Although interactions with  rifampin go ahead and begin the inderal la for ha may help the BP some also. If pulse  goes and stays low   45 and below let us  know .  I will involve pharmacy about other options realizing that rifampin interferes with many meds.   For now I agree with holding  the  RA meds  and involve Dr Germaine  for reasons discussed .   Send in readings  and fu   contact  in 3-4 weeks .  Can do virtual or other.

## 2024-08-05 NOTE — Telephone Encounter (Signed)
 Advised patient that per Dr. Dolphus: We usually hold immunosuppressive agents for 1 month.  Once they on the treatment for latent tuberculosis for 1 month then we restart DMARDs.   Patient verbalized understanding and will send a MyChart message closer to her 1 month mark of latent TB treatment.

## 2024-08-05 NOTE — Telephone Encounter (Signed)
-----   Message from [facility] sent at 08/05/2024  4:46 PM EST ----- I appreciate your note.  We usually hold immunosuppressive agents for 1 month.  Once they on the treatment for latent tuberculosis for 1 month then we restart DMARDs. I will let the patient know. Thank you, Maya Nash, MD ----- Message ----- From: Charlett Apolinar POUR, MD Sent: 08/05/2024   4:27 PM EST To: Maya Nash, MD; [place] CHRISTELLA Craze, PA-C  She is now on rifampin for latent TB  cultures pending . Please advise her about holding the CIMZIA   and plan for fu at this time  Thanks

## 2024-08-05 NOTE — Progress Notes (Signed)
 Chief Complaint  Patient presents with   Medical Management of Chronic Issues    Pt would like go over her lab results. She states she had fluid extracted from lung twice.      HPI: Jennifer [name] 48 y.o. come in for fu of HT BP meds   and 1 month fu  since hospital dc pkan  where she had Pleural effusion non malignant  abnormal transaminases,  Was noted to have converted to a  pos quantiferon gold with multiple negative afbs stains  cx pending andfollow by ID team  She was placed on rifampin  for 4 months  for  prevnetative therapy .   Pleural effusion have been tapped x 2   neg cytology and bact cx  and  pos mononuclear cells.  Also  had fu with migraines and medication  imitrex Nasal spray  w dr Wallis who ordered  suppression with inderal la 60  she hasn't taken yet   Her bp  and risen and spiked  since on th rifampin   had been controlled  in 130 and lower range previous on amlodipine  10 mg.  Feels this is from drug interference.   In interim off  anti RA meds   and ven tylenol  when lfts were off.  Hurts  knees and elbows  topical not that helpfjl . Went back to work  as opposed to home and feels very tired  trying to not be exposed to contagious persons.   No new sob still gets winded  no fever.   Has fu rheum in December .  Has a number of questionas and asks to go over labs  results from hosp and after .   Stopping the cenzia ... because of fluid  but then ? Scheduled to have this   No fever  Pain  tylenol  but not recently  ROS: See pertinent positives and negatives per HPI.  Past Medical History:  Diagnosis Date   Acute otitis media 08/28/2012   improved  change to liquid medication    Anxiety disorder 03/29/2016   Breast abscess of female    Recurrent   Depressive disorder 03/29/2016   Family history of adverse reaction to anesthesia    father hard time waking once (12/20/2016)   Fibroids    w/bleeding   GERD (gastroesophageal reflux disease)    History of blood  transfusion    after surgery   Hypertension    Migraines    Dr. Layman Meissner; I have a few/month (12/20/2016)   Osteoarthritis    Pill esophagitis 08/28/2012   amoxicillin   by hx  disc plan nl voice except hoarse not drooling  close fu  if not getting better with plan stop aleve   liquid ibu onlyf necessary    Recurrent periodic urticaria    Rheumatoid arthritis (HCC)    55% of my body (12/20/2016)   Rheumatoid arthritis (HCC)    Sleep apnea    wears cpap    Family History  Problem Relation Age of Onset   Hypertension Mother    Rheum arthritis Mother    Hypertension Father    Sleep apnea Father    Breast cancer Neg Hx    Colon cancer Neg Hx    Colon polyps Neg Hx    Esophageal cancer Neg Hx    Rectal cancer Neg Hx    Stomach cancer Neg Hx     Social History   Socioeconomic History   Marital status: Single  Spouse name: Not on file   Number of children: Not on file   Years of education: Not on file   Highest education level: Some college, no degree  Occupational History   Not on file  Tobacco Use   Smoking status: Never    Passive exposure: Never   Smokeless tobacco: Never  Vaping Use   Vaping status: Never Used  Substance and Sexual Activity   Alcohol use: Yes    Comment: social   Drug use: Never   Sexual activity: Not on file  Other Topics Concern   Not on file  Social History Narrative   Not on file   Social Drivers of Health   Financial Resource Strain: Low Risk  (06/20/2023)   Overall Financial Resource Strain (CARDIA)    Difficulty of Paying Living Expenses: Not hard at all  Food Insecurity: No Food Insecurity (06/24/2024)   Hunger Vital Sign    Worried About Running Out of Food in the Last Year: Never true    Ran Out of Food in the Last Year: Never true  Transportation Needs: No Transportation Needs (06/24/2024)   PRAPARE - Administrator, Civil Service (Medical): No    Lack of Transportation (Non-Medical): No  Physical Activity:  Inactive (06/20/2023)   Exercise Vital Sign    Days of Exercise per Week: 0 days    Minutes of Exercise per Session: 0 min  Stress: Stress Concern Present (06/20/2023)   [employer] of Occupational Health - Occupational Stress Questionnaire    Feeling of Stress : Rather much  Social Connections: Moderately Integrated (06/20/2023)   Social Connection and Isolation Panel    Frequency of Communication with Friends and Family: More than three times a week    Frequency of Social Gatherings with Friends and Family: Never    Attends Religious Services: More than 4 times per year    Active Member of [employer] or Organizations: Yes    Attends Engineer, Structural: More than 4 times per year    Marital Status: Never married    Outpatient Medications Prior to Visit  Medication Sig Dispense Refill   amLODipine  (NORVASC ) 10 MG tablet Take 1 tablet (10 mg total) by mouth daily. 90 tablet 0   cetirizine  (ZYRTEC ) 10 MG tablet Take 10 mg by mouth daily as needed for allergies.     HYDROcodone  bit-homatropine (HYCODAN) 5-1.5 MG/5ML syrup Take 5 mLs by mouth every 6 (six) hours as needed for cough. 120 mL 0   Levonorgestrel (KYLEENA) 19.5 MG IUD 19.5 mg by Intrauterine route once.     NON FORMULARY Pt uses cpap nightly     prednisoLONE  acetate (PRED FORTE ) 1 % ophthalmic suspension Place 1 drop into the left eye 2 (two) times daily. As needed     predniSONE  (DELTASONE ) 10 MG tablet Take by mouth.     propranolol ER (INDERAL LA) 60 MG 24 hr capsule Take 1 capsule (60 mg total) by mouth daily. 30 capsule 5   rifampin (RIFADIN) 300 MG capsule Take 2 capsules (600 mg total) by mouth daily. 60 capsule 5   SUMAtriptan  (IMITREX) 20 MG/ACT nasal spray Place 1 spray (20 mg total) into the nose as needed for migraine or headache. May repeat in 2 hours if headache persists or recurs.  Maximum 2 sprays in 24 hours. 3 each 1   certolizumab pegol  (CIMZIA ) 2 X 200 MG KIT Inject 400 mg into the skin every 28  (twenty-eight) days. (Patient not taking:  Reported on 08/05/2024)     No facility-administered medications prior to visit.     EXAM:  BP (!) 140/84 (BP Location: Right Arm, Patient Position: Sitting, Cuff Size: Large)   Pulse 72   Temp 97.8 F (36.6 C) (Oral)   Ht 5' 6 (1.676 m)   Wt 283 lb (128.4 kg)   SpO2 96%   BMI 45.68 kg/m   Body mass index is 45.68 kg/m.  GENERAL: vitals reviewed and listed above, alert, oriented, appears well hydrated and in no acute distress masked  HEENT: atraumatic, conjunctiva  clear, no obvious abnormalities on inspection of external nose and ears NECK: no obvious masses on inspection palpation  LUNGS: clear to auscultation bilaterally, no wheezes, rales or rhonchi,I hear bs at both bases  CV: HRRR, no clubbing cyanosis nl cap refill  MS: moves all extremities without noticeable focal acute  abnormality PSYCH: pleasant and cooperative, no obvious depression or anxiety Neuro non focal  no tremor  nl cognition.  Lab Results  Component Value Date   WBC 5.9 07/21/2024   HGB 12.5 07/21/2024   HCT 38.7 07/21/2024   PLT 239 07/21/2024   GLUCOSE 79 07/21/2024   CHOL 181 05/22/2024   TRIG 66 05/22/2024   HDL 70 05/22/2024   LDLCALC 96 05/22/2024   ALT 13 07/21/2024   AST 15 07/21/2024   NA 138 07/21/2024   K 3.7 07/21/2024   CL 101 07/21/2024   CREATININE 0.94 07/21/2024   BUN 15 07/21/2024   CO2 27 07/21/2024   TSH 1.040 06/25/2024   INR 1.1 (H) 02/28/2021   HGBA1C 5.4 06/20/2023   BP Readings from Last 3 Encounters:  08/05/24 (!) 140/84  07/25/24 (!) 147/94  07/21/24 134/87    ASSESSMENT AND PLAN:  Discussed the following assessment and plan:  Essential hypertension  Medication management  Pleural effusion  Positive QuantiFERON-TB Gold test - recnet conversion  Rheumatoid arthritis involving multiple sites with positive rheumatoid factor (HCC) - on cimzia  injections BP rising since on rifampin prob drug IA   cannot take arb  or acei  Even though can interfere  ok to begin the inderal LA to try    for HA suppression and possibly help with BP control  Pos  quant gold x 2  conversion    told she has  latent TB    effusion; afb cx pending  Hx of pleural effusion tapped x 2  , still has some breathlessness and fatigue  but  not acute finding today Will share note with rheum team . Suspect to stay off  RA meds while  rx for tb conversion latent .  She is unclear as am I about the defined cause of the effusion.  But reviewed labs and results so far.   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  About exposure risk   at this time.  Plan having Jon help assess bp med options  with the rifampin  IA risks  Work note for today visit . Record review and labs with patient  ?s  and counsel   coordinate messages.  Team 45 minutes  -Patient advised to return or notify health care team  if  new concerns arise. In interim   Patient Instructions  Good to see  you today .  Although interactions with  rifampin go ahead and begin the inderal la for ha may help the BP some also. If pulse  goes and stays low   45 and below let us  know .  I will involve pharmacy about other options realizing that rifampin interferes with many meds.   For now I agree with holding  the  RA meds  and involve Dr Germaine  for reasons discussed .   Send in readings  and fu   contact  in 3-4 weeks .  Can do virtual or other.     Jjesus Dingley K. Salisha Bardsley M.D.

## 2024-08-06 ENCOUNTER — Telehealth: Payer: Self-pay

## 2024-08-06 DIAGNOSIS — I10 Essential (primary) hypertension: Secondary | ICD-10-CM

## 2024-08-06 NOTE — Progress Notes (Signed)
   08/06/2024  Patient ID: Jennifer [name], female   DOB: [birth year], 48 y.o.   MRN: [id number]  Received request from PCP to engage patient regarding elevated HTN after starting rifampin.  Contacted patient via phone and setup appt for 08/13/24 at 1:30pm via phone.  Advised patient to check BP once daily and keep a log, have present at time of phone call to review.  Jon VEAR Lindau, PharmD Clinical Pharmacist [phone]

## 2024-08-08 LAB — ACID FAST CULTURE WITH REFLEXED SENSITIVITIES (MYCOBACTERIA): Acid Fast Culture: NEGATIVE

## 2024-08-13 ENCOUNTER — Other Ambulatory Visit

## 2024-08-13 DIAGNOSIS — I10 Essential (primary) hypertension: Secondary | ICD-10-CM

## 2024-08-13 NOTE — Progress Notes (Signed)
 08/13/2024 Name: Jennifer [name] MRN: [id number] DOB: 04/21/1976  Chief Complaint  Patient presents with   Medication Management   Hypertension    Jennifer [name] is a 48 y.o. year old female who presented for a telephone visit.   They were referred to the pharmacist by their PCP for assistance in managing hypertension.    Subjective:  Care Team: Primary Care Provider: Charlett Apolinar POUR, MD   Medication Access/Adherence  Current Pharmacy:  [facility] [address] [place] - [address] [name] [address] Phone: [phone] Fax: [phone]  [facility] - [place], [place] - [address] [place] [address] Phone: [phone] Fax: [phone]  [facility] - [facility] [address] [place] [address] Phone: [phone] Fax: [phone]   Patient reports affordability concerns with their medications: No  Patient reports access/transportation concerns to their pharmacy: No  Patient reports adherence concerns with their medications:  No     Hypertension:  Current medications: Amlodipine  10mg  daily Medications previously tried: Lisinopril -hydrochlorothiazide  (hives/swelling)  Patient has a validated, automated, upper arm home BP cuff Current blood pressure readings readings: Driving so does not have exact log in front of her but mostly seeing readings in the 140s-150/80s  Patient denies hypotensive s/sx including dizziness, lightheadedness.  Patient denies hypertensive symptoms including headache, chest pain, shortness of breath    Objective:  Lab Results  Component Value Date   HGBA1C 5.4 06/20/2023    Lab Results  Component Value Date   CREATININE 0.94 07/21/2024   BUN 15 07/21/2024   NA 138 07/21/2024   K 3.7 07/21/2024   CL 101 07/21/2024   CO2 27 07/21/2024    Lab Results  Component Value Date   CHOL 181 05/22/2024   HDL 70 05/22/2024    LDLCALC 96 05/22/2024   TRIG 66 05/22/2024   CHOLHDL 2.6 05/22/2024    Medications Reviewed Today     Reviewed by Lionell Jon DEL, RPH (Pharmacist) on 08/13/24 at 1338  Med List Status: <None>   Medication Order Taking? Sig Documenting Provider Last Dose Status Informant  amLODipine  (NORVASC ) 10 MG tablet [id number]  Take 1 tablet (10 mg total) by mouth daily. [place], Camellia PARAS, DO  Active   certolizumab pegol  (CIMZIA ) 2 X 200 MG KIT [id number]  Inject 400 mg into the skin every 28 (twenty-eight) days.  Patient not taking: Reported on 08/05/2024   [provider]  Active Self, Pharmacy Records  cetirizine  (ZYRTEC ) 10 MG tablet [id number]  Take 10 mg by mouth daily as needed for allergies. [provider]  Active Self, Pharmacy Records  Discontinued 11/29/20 1911   HYDROcodone  bit-homatropine (HYCODAN) 5-1.5 MG/5ML syrup [id number]  Take 5 mLs by mouth every 6 (six) hours as needed for cough. [place], Camellia PARAS, DO  Active   Levonorgestrel (KYLEENA) 19.5 MG IUD [id number]  19.5 mg by Intrauterine route once. [provider]  Active Self, Pharmacy Records           Med Note RENNIS, DUROJAHYE' R   Mon Jun 23, 2024 10:09 PM) IUD is currently in the patient  NON FORMULARY [id number]  Pt uses cpap nightly [provider]  Active Self, Pharmacy Records  prednisoLONE  acetate (PRED FORTE ) 1 % ophthalmic suspension [id number]  Place 1 drop into the left eye 2 (two) times daily. As needed [provider]  Active Self, Pharmacy Records  predniSONE  (DELTASONE ) 10 MG tablet  [id number]  Take by mouth. [provider]  Active   propranolol  ER (INDERAL  LA) 60 MG 24 hr capsule [id number]  Take 1 capsule (60 mg total) by mouth daily. Skeet Juliene SAUNDERS, DO  Active   rifampin  (RIFADIN ) 300 MG capsule [id number]  Take 2 capsules (600 mg total) by mouth daily. Dennise Kingsley, MD  Active   SUMAtriptan  (IMITREX ) 20 MG/ACT nasal spray [id number]  Place 1 spray (20 mg total) into  the nose as needed for migraine or headache. May repeat in 2 hours if headache persists or recurs.  Maximum 2 sprays in 24 hours. Skeet Juliene SAUNDERS, DO  Active               Assessment/Plan:   Hypertension: - Currently uncontrolled - Reviewed long term cardiovascular and renal outcomes of uncontrolled blood pressure - Reviewed appropriate blood pressure monitoring technique and reviewed goal blood pressure. Recommended to check home blood pressure and heart rate daily - Recommend to start propranolol  as prescribed. Reach out sooner than scheduled f/u if you begin to have any symptoms of high BP or see numbers 160 or higher for SBP. -If BP remains above 140/90 after initiating propranolol , consider initiation of spironolactone 12.5mg  daily wile taking rifampin  -Counseled to limit sodium/caffeine intake   Follow Up Plan: 08/27/24  Jon VEAR Lindau, PharmD Clinical Pharmacist [phone]

## 2024-08-18 ENCOUNTER — Encounter: Payer: Self-pay | Admitting: Internal Medicine

## 2024-08-18 DIAGNOSIS — Z79899 Other long term (current) drug therapy: Secondary | ICD-10-CM

## 2024-08-18 DIAGNOSIS — I10 Essential (primary) hypertension: Secondary | ICD-10-CM

## 2024-08-19 ENCOUNTER — Other Ambulatory Visit (HOSPITAL_COMMUNITY): Payer: Self-pay

## 2024-08-19 NOTE — Telephone Encounter (Signed)
 We counseled her that rifampin  can definitely decrease amlodipine  levels while on therapy. She may need a higher dose or an additional blood pressure medicine while she completes rifampin .

## 2024-08-20 NOTE — Telephone Encounter (Signed)
 I cannot tell for sure  from this message  from a month ago  what anti hypertension meds currently  Jon  addressed meds  because of the rifampin  interactions ( see below)  Suggest a visit contact virtual ok  and we could consider adding spironolactone and follow  potassium level   Hypertension: - Currently uncontrolled - Reviewed long term cardiovascular and renal outcomes of uncontrolled blood pressure - Reviewed appropriate blood pressure monitoring technique and reviewed goal blood pressure. Recommended to check home blood pressure and heart rate daily - Recommend to start propranolol  as prescribed. Reach out sooner than scheduled f/u if you begin to have any symptoms of high BP or see numbers 160 or higher for SBP. -If BP remains above 140/90 after initiating propranolol , consider initiation of spironolactone 12.5mg  daily wile taking rifampin  -Counseled to limit sodium/caffeine intake     Follow Up Plan: 08/27/24   Jon VEAR Lindau, PharmD Clinical Pharmacist

## 2024-08-20 NOTE — Telephone Encounter (Signed)
 Just confirming, is there anything triage needs to do?

## 2024-08-20 NOTE — Telephone Encounter (Signed)
 Just communicate the above message

## 2024-08-20 NOTE — Telephone Encounter (Signed)
 We communicated medication interaction with pcp. Propranolol  and amlodipine  effectiveness may be decreased due to rifampin , spironolactone should not be effected in efficacy. Spironolactone should not be effected in efficacy Defer to pcp for BP management.

## 2024-08-21 ENCOUNTER — Other Ambulatory Visit (HOSPITAL_COMMUNITY): Payer: Self-pay

## 2024-08-21 ENCOUNTER — Other Ambulatory Visit: Payer: Self-pay

## 2024-08-21 MED ORDER — SPIRONOLACTONE 25 MG PO TABS
12.5000 mg | ORAL_TABLET | Freq: Every day | ORAL | 1 refills | Status: DC
  Filled 2024-08-21: qty 30, 60d supply, fill #0

## 2024-08-21 NOTE — Telephone Encounter (Signed)
 Spoke with patient over the phone this morning. States she started propranolol  on Friday morning and has continued taking amlodipine  daily. Stopped taking her rifampin  on Tuesday due to elevated BP readings. Does not want to resume rifampin  until there is a plan to keep her BP in a safe range. Informed her that blood pressure management will be per her PCP. Would recommend increasing doses as needed of amlodipine  and/or propranolol . Discussed that she should reach out to her PCP by end of the day today if she has not heard from them about management.  Dr. Charlett - please see her concerns. We will defer BP management to you and your team. Thank you!   Alan Geralds, PharmD, CPP, BCIDP, AAHIVP Clinical Pharmacist Practitioner Infectious Diseases Clinical Pharmacist [facility]

## 2024-08-21 NOTE — Telephone Encounter (Signed)
 I called  patient and spoke with her personally  bp this am 144/90 but had been very high  160 170 range when was on rifampin  so stopped for past 2 days . Regarding messages. Stay on amlodipine  10 and propranolol   Will send in spironolactone 12.5 per day as no drug IA  Plan lab next week  bmp  and then visit with me  virtual ok thurs am?  Ok to begin  her injections also  since effusion was felt to be from  RA and not infection ( abeit had pos quantiferon conversion)   Should be ok to restart rifampin  and monitor

## 2024-08-25 NOTE — Progress Notes (Unsigned)
 Office Visit Note  Patient: Jennifer [name]             Date of Birth: [birth year]           MRN: [id number]             PCP: Charlett Apolinar POUR, MD Referring: Charlett Apolinar POUR, MD Visit Date: 09/05/2024 Occupation: Data Unavailable  Subjective:  No chief complaint on file.   History of Present Illness: Jennifer [name] is a 48 y.o. female ***   Pleural effusion-Hospital records were reviewed.  Patient underwent ultrasound-guided thoracentesis on 10/7 with 1.1 L fluid removed.  Serum LDH 218, total protein 8.2. Pleural fluid cell count with 7662 nucleated cells, 4 neutrophils. Pleural fluid culture with no WBCs/organisms seen, no growth.  She was discharged on Lasix  40 mg p.o. twice daily for 30 days and prednisone  taper starting at 20 mg for 5 days followed by 10 mg for 5 days and 5 mg for 5 days.  She has an appointment coming up with the pulmonologist next week.   Activities of Daily Living:  Patient reports morning stiffness for *** {minute/hour:19697}.   Patient {ACTIONS;DENIES/REPORTS:21021675::Denies} nocturnal pain.  Difficulty dressing/grooming: {ACTIONS;DENIES/REPORTS:21021675::Denies} Difficulty climbing stairs: {ACTIONS;DENIES/REPORTS:21021675::Denies} Difficulty getting out of chair: {ACTIONS;DENIES/REPORTS:21021675::Denies} Difficulty using hands for taps, buttons, cutlery, and/or writing: {ACTIONS;DENIES/REPORTS:21021675::Denies}  No Rheumatology ROS completed.   PMFS History:  Patient Active Problem List   Diagnosis Date Noted   Pleural effusion 06/23/2024   Hidradenitis suppurativa 11/23/2022   OSA (obstructive sleep apnea) 02/23/2022   Morbid obesity (HCC) 02/23/2022   History of total knee replacement, right 07/22/2021   Primary osteoarthritis of right knee 03/28/2021   OA (osteoarthritis) of knee 04/02/2020   Pain in right knee 01/13/2020   Chronic pain of right knee 01/21/2019   S/P right knee arthroscopy 07/23/2018   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    Primary osteoarthritis of both hands 02/04/2018   Sleep apnea, obstructive 09/28/2017   Primary osteoarthritis of both knees 05/22/2017   Chronic migraine without aura without status migrainosus, not intractable 01/18/2017   Incisional hernia 12/18/2016   Leiomyoma of uterus 09/06/2016   Anxiety disorder 03/29/2016   Depressive disorder 03/29/2016   Fibroid, uterine    Essential hypertension 05/25/2015   Recurrent headache 05/25/2015   Head lump 07/31/2014   Edema 12/29/2013   Baker's cyst, ruptured 12/25/2013   Cough, persistent 12/12/2013   Left leg swelling 12/12/2013   Cough 12/12/2013   High risk medication use 12/12/2013   Recurrent periodic urticaria    Acne vulgaris 11/21/2012   Postinflammatory hyperpigmentation 11/21/2012   Rheumatoid arthritis (HCC) 08/28/2012   CHRONIC LARYNGITIS 11/03/2010   Allergic rhinitis 11/03/2010   PARESTHESIA 07/28/2010   DYSMENORRHEA 10/15/2007   FIBROIDS, UTERUS 07/24/2007   OBESITY 07/24/2007   SLEEPLESSNESS 07/24/2007   HEADACHE 07/24/2007    Past Medical History:  Diagnosis Date   Acute otitis media 08/28/2012   improved  change to liquid medication    Anxiety disorder 03/29/2016   Breast abscess of female    Recurrent   Depressive disorder 03/29/2016   Family history of adverse reaction to anesthesia    father hard time waking once (12/20/2016)   Fibroids    w/bleeding   GERD (gastroesophageal reflux disease)    History of blood transfusion    after surgery   Hypertension    Migraines    Dr. Layman Meissner; I have a few/month (12/20/2016)   Osteoarthritis    Pill esophagitis 08/28/2012  amoxicillin   by hx  disc plan nl voice except hoarse not drooling  close fu  if not getting better with plan stop aleve   liquid ibu onlyf necessary    Recurrent periodic urticaria    Rheumatoid arthritis (HCC)    55% of my body (12/20/2016)   Rheumatoid arthritis (HCC)    Sleep apnea    wears cpap    Family History  Problem Relation  Age of Onset   Hypertension Mother    Rheum arthritis Mother    Hypertension Father    Sleep apnea Father    Breast cancer Neg Hx    Colon cancer Neg Hx    Colon polyps Neg Hx    Esophageal cancer Neg Hx    Rectal cancer Neg Hx    Stomach cancer Neg Hx    Past Surgical History:  Procedure Laterality Date   BREAST SURGERY     abcess under left breast    CYST EXCISION Left 2014   elbow   HERNIA REPAIR     INCISION AND DRAINAGE BREAST ABSCESS Left 2003   INCISIONAL HERNIA REPAIR N/A 12/18/2016   Procedure: REPAIR INCISIONAL HERNIA WITH MESH;  Surgeon: Dann Hummer, MD;  Location: [facility];  Service: General;  Laterality: N/A;   INSERTION OF MESH N/A 12/18/2016   Procedure: INSERTION OF MESH;  Surgeon: Dann Hummer, MD;  Location: [facility];  Service: General;  Laterality: N/A;   IR THORACENTESIS ASP PLEURAL SPACE W/IMG GUIDE  06/24/2024   IR THORACENTESIS ASP PLEURAL SPACE W/IMG GUIDE  07/14/2024   KNEE ARTHROSCOPY WITH MENISCAL REPAIR Right 06/17/2018   Procedure: RIGHT KNEE ARTHROSCOPY WITH MENISCAL REPAIR;  Surgeon: Rubie Kemps, MD;  Location: [facility];  Service: Orthopedics;  Laterality: Right;   MYOMECTOMY N/A 09/06/2016   Procedure: EZZIE MERL;  Surgeon: Cynthia Loss, MD;  Location: [facility];  Service: Gynecology;  Laterality: N/A;   ROOT CANAL  05/2022   TOTAL KNEE ARTHROPLASTY Right 03/28/2021   Procedure: TOTAL KNEE ARTHROPLASTY;  Surgeon: Melodi Lerner, MD;  Location: [facility];  Service: Orthopedics;  Laterality: Right;    UTERINE FIBROID SURGERY     34 fibroids   Social History   Tobacco Use   Smoking status: Never    Passive exposure: Never   Smokeless tobacco: Never  Vaping Use   Vaping status: Never Used  Substance Use Topics   Alcohol use: Yes    Comment: social   Drug use: Never   Social History   Social History Narrative   Not on file     Immunization History  Administered Date(s) Administered   Influenza Split 08/21/2012    Influenza,inj,Quad PF,6+ Mos 07/31/2014, 08/19/2019   Pneumococcal Conjugate-13 08/28/2012   Unspecified SARS-COV-2 Vaccination 01/09/2020, 01/30/2020, 09/24/2020     Objective: Vital Signs: There were no vitals taken for this visit.   Physical Exam   Musculoskeletal Exam: ***  CDAI Exam: CDAI Score: -- Patient Global: --; Provider Global: -- Swollen: --; Tender: -- Joint Exam 09/05/2024   No joint exam has been documented for this visit   There is currently no information documented on the homunculus. Go to the Rheumatology activity and complete the homunculus joint exam.  Investigation: No additional findings.  Imaging: No results found.  Recent Labs: Lab Results  Component Value Date   WBC 5.9 07/21/2024   HGB 12.5 07/21/2024   PLT 239 07/21/2024   NA 138 07/21/2024   K 3.7 07/21/2024   CL 101 07/21/2024  CO2 27 07/21/2024   GLUCOSE 79 07/21/2024   BUN 15 07/21/2024   CREATININE 0.94 07/21/2024   BILITOT 0.5 07/21/2024   ALKPHOS 84 06/25/2024   AST 15 07/21/2024   ALT 13 07/21/2024   PROT 9.2 (H) 07/21/2024   ALBUMIN 3.5 06/25/2024   CALCIUM 9.1 07/21/2024   GFRAA 84 07/02/2020   QFTBGOLDPLUS POSITIVE (A) 07/09/2024    Speciality Comments: Prior therapy: Plaquenil (inadequate response) Methotrexate  06/2019-03/2022  Patient is needs to be seen in the office every 3 months to get Cimzia  injections.  Procedures:  No procedures performed Allergies: Lisinopril  and Lisinopril -hydrochlorothiazide    Assessment / Plan:     Visit Diagnoses: Rheumatoid arthritis involving multiple sites with positive rheumatoid factor (HCC)  High risk medication use  Pleural effusion  Iritis  Primary osteoarthritis of both hands  Primary osteoarthritis of left knee  S/P total knee arthroplasty, right  Essential hypertension  Dyslipidemia  Orders: No orders of the defined types were placed in this encounter.  No orders of the defined types were placed in this  encounter.   Face-to-face time spent with patient was *** minutes. Greater than 50% of time was spent in counseling and coordination of care.  Follow-Up Instructions: No follow-ups on file.   Waddell CHRISTELLA Craze, PA-C  Note - This record has been created using Dragon software.  Chart creation errors have been sought, but may not always  have been located. Such creation errors do not reflect on  the standard of medical care.

## 2024-08-26 NOTE — Telephone Encounter (Signed)
 Attempted to reach pt. Left a detail message to call us  back and apologize for delaying on follow up.

## 2024-08-26 NOTE — Telephone Encounter (Signed)
 Follow up with pt. Pt reports she spoke with Dr. Charlett last week and new Med was added. Pt reports her last BP check was yesterday and it was fine. Unable to recall the number and about to head to work. Pt states she is not able to be seen this week. But can come next week. Appt is set for 09/02/2024 for 300pm.

## 2024-08-27 ENCOUNTER — Other Ambulatory Visit

## 2024-08-27 DIAGNOSIS — I10 Essential (primary) hypertension: Secondary | ICD-10-CM

## 2024-08-27 NOTE — Telephone Encounter (Signed)
Noted ok. 

## 2024-08-28 LAB — ACID FAST CULTURE WITH REFLEXED SENSITIVITIES (MYCOBACTERIA): Acid Fast Culture: NEGATIVE

## 2024-08-28 NOTE — Progress Notes (Signed)
 08/28/2024 Name: Jennifer [name] MRN: [id number] DOB: 10-Apr-1976  Chief Complaint  Patient presents with   Medication Management    Jennifer [name] is a 48 y.o. year old female who presented for a telephone visit.   They were referred to the pharmacist by their PCP for assistance in managing hypertension.    Subjective:  Care Team: Primary Care Provider: Charlett Apolinar POUR, MD   Medication Access/Adherence  Current Pharmacy:  [facility] [address], [place] - [address] [name] [address] Phone: [phone] Fax: [phone]  [facility] - [name], [place] - [address] [place] [address] Phone: [phone] Fax: [phone]  Jennifer [name] - [facility] [address] [name] [address] Phone: [phone] Fax: [phone]   Patient reports affordability concerns with their medications: No  Patient reports access/transportation concerns to their pharmacy: No  Patient reports adherence concerns with their medications:  No     Hypertension:  Current medications: Amlodipine  10mg  daily, Spironolactone  12.5mg  daily Medications previously tried: Lisinopril -hydrochlorothiazide  (hives/swelling)  Patient has a validated, automated, upper arm home BP cuff Current blood pressure readings readings:140s-150s/90s  Patient denies hypotensive s/sx including dizziness, lightheadedness.  Patient denies hypertensive symptoms including headache, chest pain, shortness of breath    Objective:  Lab Results  Component Value Date   HGBA1C 5.4 06/20/2023    Lab Results  Component Value Date   CREATININE 0.94 07/21/2024   BUN 15 07/21/2024   NA 138 07/21/2024   K 3.7 07/21/2024   CL 101 07/21/2024   CO2 27 07/21/2024    Lab Results  Component Value Date   CHOL 181 05/22/2024   HDL 70 05/22/2024   LDLCALC 96 05/22/2024   TRIG 66 05/22/2024   CHOLHDL 2.6 05/22/2024     Medications Reviewed Today     Reviewed by Jennifer [name], RPH (Pharmacist) on 08/28/24 at 1400  Med List Status: <None>   Medication Order Taking? Sig Documenting Provider Last Dose Status Informant  amLODipine  (NORVASC ) 10 MG tablet [id number]  Take 1 tablet (10 mg total) by mouth daily. [place], Camellia PARAS, DO  Active   certolizumab pegol  (CIMZIA ) 2 X 200 MG KIT [id number]  Inject 400 mg into the skin every 28 (twenty-eight) days.  Patient not taking: Reported on 08/05/2024   [provider]  Active Self, Pharmacy Records  cetirizine  (ZYRTEC ) 10 MG tablet [id number]  Take 10 mg by mouth daily as needed for allergies. [provider]  Active Self, Pharmacy Records  Discontinued 11/29/20 1911   HYDROcodone  bit-homatropine (HYCODAN) 5-1.5 MG/5ML syrup [id number]  Take 5 mLs by mouth every 6 (six) hours as needed for cough. [place], Camellia PARAS, DO  Active   Levonorgestrel (KYLEENA) 19.5 MG IUD [id number]  19.5 mg by Intrauterine route once. [provider]  Active Self, Pharmacy Records           Med Note RENNIS, DUROJAHYE' R   Mon Jun 23, 2024 10:09 PM) IUD is currently in the patient  NON FORMULARY [id number]  Pt uses cpap nightly [provider]  Active Self, Pharmacy Records  prednisoLONE  acetate (PRED FORTE ) 1 % ophthalmic suspension [id number]  Place 1 drop into the left eye 2 (two) times daily. As needed [provider]  Active Self, Pharmacy Records  predniSONE  (DELTASONE ) 10 MG tablet [id number]  Take by mouth. [provider]  Active   propranolol  ER (INDERAL  LA) 60 MG  24 hr capsule [id number]  Take 1 capsule (60 mg total) by mouth daily. Skeet Juliene SAUNDERS, DO  Active   rifampin  (RIFADIN ) 300 MG capsule [id number]  Take 2 capsules (600 mg total) by mouth daily. Dennise Kingsley, MD  Active   spironolactone  (ALDACTONE ) 25 MG tablet [id number]  Take 0.5 tablets (12.5 mg total) by mouth daily. For high BP Panosh, Wanda K, MD  Active   SUMAtriptan   (IMITREX ) 20 MG/ACT nasal spray [id number]  Place 1 spray (20 mg total) into the nose as needed for migraine or headache. May repeat in 2 hours if headache persists or recurs.  Maximum 2 sprays in 24 hours. Skeet Juliene SAUNDERS, DO  Active               Assessment/Plan:   Hypertension: - Currently uncontrolled - Reviewed [name] term cardiovascular and renal outcomes of uncontrolled blood pressure - Reviewed appropriate blood pressure monitoring technique and reviewed goal blood pressure. Recommended to check home blood pressure and heart rate daily  Reach out sooner than scheduled f/u if you begin to have any symptoms of high BP or see numbers 160 or higher for SBP. -Counseled that spironolactone  can take 1-2 weeks to really kick in, f BP remains above 140/90 at PCP visit next week, consider increase of spironolactone  to full 25mg  tablet daily -Counseled to limit sodium/caffeine intake   Follow Up Plan: 2 weeks  Jennifer [name], PharmD Clinical Pharmacist [phone]

## 2024-09-02 ENCOUNTER — Ambulatory Visit: Admitting: Internal Medicine

## 2024-09-02 ENCOUNTER — Encounter: Payer: Self-pay | Admitting: Internal Medicine

## 2024-09-02 VITALS — BP 146/88 | Temp 98.1°F | Ht 66.0 in | Wt 287.8 lb

## 2024-09-02 DIAGNOSIS — R7612 Nonspecific reaction to cell mediated immunity measurement of gamma interferon antigen response without active tuberculosis: Secondary | ICD-10-CM

## 2024-09-02 DIAGNOSIS — I10 Essential (primary) hypertension: Secondary | ICD-10-CM

## 2024-09-02 DIAGNOSIS — Z79899 Other long term (current) drug therapy: Secondary | ICD-10-CM

## 2024-09-02 DIAGNOSIS — M0579 Rheumatoid arthritis with rheumatoid factor of multiple sites without organ or systems involvement: Secondary | ICD-10-CM

## 2024-09-02 DIAGNOSIS — G4733 Obstructive sleep apnea (adult) (pediatric): Secondary | ICD-10-CM

## 2024-09-02 NOTE — Progress Notes (Signed)
 Chief Complaint  Patient presents with   Medical Management of Chronic Issues    HPI: Jennifer [name] 48 y.o. come in for Chronic disease management  1 week follow-up in regard to beginning spironolactone .  See previous notes.  Blood pressure readings have been out of control since begun on rifampin .  known to interfere w amlodipine .  She continues on amlodipine  10 mg began spironolactone  recommended by pharmacy team but began taking 25 mg at the start and denies any side effect.   She has a log of blood pressure readings many of them are 151 is 177 diastolic 90-1 100s.  Has had 1 or 2 readings in the 140/90 or less.  She has been on prednisone  and off her antirheumatic. Breathing is about the same she has a follow-up with pulmonary next week.  She was hesitant to restart her injections because of the situation with pleural effusion. She is still taking the Inderal  LA 60 mg for headaches from neurology. She does have a blood pressure monitor at home . Jennifer [name]  Wonders if it is accurate. She has been on prednisone  recently has gained 10 pounds over the last month or less  She never heard from cardiology   ROS: See pertinent positives and negatives per HPI. No cp  new sob fever   aching nad joint hurt all over    Past Medical History:  Diagnosis Date   Acute otitis media 08/28/2012   improved  change to liquid medication    Anxiety disorder 03/29/2016   Breast abscess of female    Recurrent   Depressive disorder 03/29/2016   Family history of adverse reaction to anesthesia    father hard time waking once (12/20/2016)   Fibroids    w/bleeding   GERD (gastroesophageal reflux disease)    History of blood transfusion    after surgery   Hypertension    Migraines    Dr. Layman Meissner; I have a few/month (12/20/2016)   Osteoarthritis    Pill esophagitis 08/28/2012   amoxicillin   by hx  disc plan nl voice except hoarse not drooling  close fu  if not getting better with plan stop aleve   liquid  ibu onlyf necessary    Recurrent periodic urticaria    Rheumatoid arthritis (HCC)    55% of my body (12/20/2016)   Rheumatoid arthritis (HCC)    Sleep apnea    wears cpap    Family History  Problem Relation Age of Onset   Hypertension Mother    Rheum arthritis Mother    Hypertension Father    Sleep apnea Father    Breast cancer Neg Hx    Colon cancer Neg Hx    Colon polyps Neg Hx    Esophageal cancer Neg Hx    Rectal cancer Neg Hx    Stomach cancer Neg Hx     Social History   Socioeconomic History   Marital status: Single    Spouse name: Not on file   Number of children: Not on file   Years of education: Not on file   Highest education level: Some college, no degree  Occupational History   Not on file  Tobacco Use   Smoking status: Never    Passive exposure: Never   Smokeless tobacco: Never  Vaping Use   Vaping status: Never Used  Substance and Sexual Activity   Alcohol use: Yes    Comment: social   Drug use: Never   Sexual activity: Not on file  Other Topics Concern   Not on file  Social History Narrative   Not on file   Social Drivers of Health   Tobacco Use: Low Risk (09/02/2024)   Patient History    Smoking Tobacco Use: Never    Smokeless Tobacco Use: Never    Passive Exposure: Never  Financial Resource Strain: Low Risk (06/20/2023)   Overall Financial Resource Strain (CARDIA)    Difficulty of Paying Living Expenses: Not hard at all  Food Insecurity: No Food Insecurity (06/24/2024)   Epic    Worried About Programme Researcher, Broadcasting/film/video in the Last Year: Never true    Ran Out of Food in the Last Year: Never true  Transportation Needs: No Transportation Needs (06/24/2024)   Epic    Lack of Transportation (Medical): No    Lack of Transportation (Non-Medical): No  Physical Activity: Inactive (06/20/2023)   Exercise Vital Sign    Days of Exercise per Week: 0 days    Minutes of Exercise per Session: 0 min  Stress: Stress Concern Present (06/20/2023)   [employer] - Occupational Stress Questionnaire    Feeling of Stress : Rather much  Social Connections: Moderately Integrated (06/20/2023)   Social Connection and Isolation Panel    Frequency of Communication with Friends and Family: More than three times a week    Frequency of Social Gatherings with Friends and Family: Never    Attends Religious Services: More than 4 times per year    Active Member of Clubs or Organizations: Yes    Attends Banker Meetings: More than 4 times per year    Marital Status: Never married  Depression (PHQ2-9): Low Risk (08/05/2024)   Depression (PHQ2-9)    PHQ-2 Score: 0  Alcohol Screen: Low Risk (06/20/2023)   Alcohol Screen    Last Alcohol Screening Score (AUDIT): 2  Housing: Low Risk (06/24/2024)   Epic    Unable to Pay for Housing in the Last Year: No    Number of Times Moved in the Last Year: 0    Homeless in the Last Year: No  Utilities: Not At Risk (06/24/2024)   Epic    Threatened with loss of utilities: No  Health Literacy: Not on file    Outpatient Medications Prior to Visit  Medication Sig Dispense Refill   amLODipine  (NORVASC ) 10 MG tablet Take 1 tablet (10 mg total) by mouth daily. 90 tablet 0   cetirizine  (ZYRTEC ) 10 MG tablet Take 10 mg by mouth daily as needed for allergies.     Levonorgestrel (KYLEENA) 19.5 MG IUD 19.5 mg by Intrauterine route once.     NON FORMULARY Pt uses cpap nightly     prednisoLONE  acetate (PRED FORTE ) 1 % ophthalmic suspension Place 1 drop into the left eye 2 (two) times daily. As needed     predniSONE  (DELTASONE ) 10 MG tablet Take by mouth.     propranolol  ER (INDERAL  LA) 60 MG 24 hr capsule Take 1 capsule (60 mg total) by mouth daily. 30 capsule 5   rifampin  (RIFADIN ) 300 MG capsule Take 2 capsules (600 mg total) by mouth daily. 60 capsule 5   spironolactone  (ALDACTONE ) 25 MG tablet Take 0.5 tablets (12.5 mg total) by mouth daily. For high BP 30 tablet 1   SUMAtriptan  (IMITREX )  20 MG/ACT nasal spray Place 1 spray (20 mg total) into the nose as needed for migraine or headache. May repeat in 2 hours if headache persists or recurs.  Maximum 2  sprays in 24 hours. 3 each 1   certolizumab pegol  (CIMZIA ) 2 X 200 MG KIT Inject 400 mg into the skin every 28 (twenty-eight) days. (Patient not taking: Reported on 09/02/2024)     HYDROcodone  bit-homatropine (HYCODAN) 5-1.5 MG/5ML syrup Take 5 mLs by mouth every 6 (six) hours as needed for cough. (Patient not taking: Reported on 09/02/2024) 120 mL 0   No facility-administered medications prior to visit.     EXAM:  BP (!) 146/88 (BP Location: Right Arm, Cuff Size: Large)   Temp 98.1 F (36.7 C) (Oral)   Ht 5' 6 (1.676 m)   Wt 287 lb 12.8 oz (130.5 kg)   BMI 46.45 kg/m   Body mass index is 46.45 kg/m. Wt Readings from Last 3 Encounters:  09/02/24 287 lb 12.8 oz (130.5 kg)  08/05/24 283 lb (128.4 kg)  07/25/24 277 lb 9.6 oz (125.9 kg)    GENERAL: vitals reviewed and listed above, alert, oriented, appears well hydrated and in no acute distress HEENT: atraumatic, conjunctiva  clear, no obvious abnormalities on inspection of external nose and ears NECK: no obvious masses on inspection palpation  LUNGS: clear to auscultation bilaterally, no wheezes, rales or rhonchi, good air movement there are breath sounds at the right base. CV: HRRR, no clubbing cyanosis nl cap refill  MS: moves all extremities without noticeable focal  abnormality but complains of specimen stiffness and pain PSYCH: pleasant and cooperative, no obvious depression or anxiety Lab Results  Component Value Date   WBC 5.9 07/21/2024   HGB 12.5 07/21/2024   HCT 38.7 07/21/2024   PLT 239 07/21/2024   GLUCOSE 79 07/21/2024   CHOL 181 05/22/2024   TRIG 66 05/22/2024   HDL 70 05/22/2024   LDLCALC 96 05/22/2024   ALT 13 07/21/2024   AST 15 07/21/2024   NA 138 07/21/2024   K 3.7 07/21/2024   CL 101 07/21/2024   CREATININE 0.94 07/21/2024   BUN 15  07/21/2024   CO2 27 07/21/2024   TSH 1.040 06/25/2024   INR 1.1 (H) 02/28/2021   HGBA1C 5.4 06/20/2023   BP Readings from Last 3 Encounters:  09/02/24 (!) 146/88  08/05/24 (!) 140/84  07/25/24 (!) 147/94    ASSESSMENT AND PLAN:  Discussed the following assessment and plan:  Essential hypertension - Plan: Basic metabolic panel with GFR  Medication management - Plan: Basic metabolic panel with GFR  Positive QuantiFERON-TB Gold test  Rheumatoid arthritis involving multiple sites with positive rheumatoid factor (HCC)  Morbid obesity (HCC)  OSA (obstructive sleep apnea) She may be a candidate for gip glp1  if meets insurance criteria asks for this .( She does have dx of sleep apnea )  Advise get back on   as consultants have advised taht the effusion was from immunologic disease and not infection HT not currently controlled send in readings early next week . May have to adjust medication  dosing  Cam call cardiology appt number given in referral  -Patient advised to return or notify health care team  if  new concerns arise.  Patient Instructions  Stay on spironolactone   25 for now and lab today  We may go up on dosing eventually  146/88 today in office  Bring  your monitors to your next appts. To check in office.   Send in readings next week  will share with  [facility] pharmacy team.   Call cardiology as planned .  Send in message Monday after pulmonary and  with bp  and how doing.     Rainna Nearhood K. Tayo Maute M.D.

## 2024-09-02 NOTE — Patient Instructions (Addendum)
 Stay on spironolactone   25 for now and lab today  We may go up on dosing eventually  146/88 today in office  Bring  your monitors to your next appts. To check in office.   Send in readings next week  will share with  [facility] pharmacy team.   Call cardiology as planned .  Send in message Monday after pulmonary and  with bp  and how doing.

## 2024-09-03 ENCOUNTER — Ambulatory Visit: Payer: Self-pay | Admitting: Internal Medicine

## 2024-09-03 LAB — BASIC METABOLIC PANEL WITH GFR
BUN: 14 mg/dL (ref 6–23)
CO2: 30 meq/L (ref 19–32)
Calcium: 9.8 mg/dL (ref 8.4–10.5)
Chloride: 105 meq/L (ref 96–112)
Creatinine, Ser: 0.97 mg/dL (ref 0.40–1.20)
GFR: 68.77 mL/min (ref >60.00)
Glucose, Bld: 79 mg/dL (ref 70–99)
Potassium: 4.1 meq/L (ref 3.5–5.1)
Sodium: 140 meq/L (ref 135–145)

## 2024-09-03 NOTE — Progress Notes (Signed)
 Potassium level and  renal function are normal  .

## 2024-09-05 ENCOUNTER — Ambulatory Visit: Attending: Physician Assistant | Admitting: Physician Assistant

## 2024-09-05 ENCOUNTER — Encounter: Payer: Self-pay | Admitting: Physician Assistant

## 2024-09-05 ENCOUNTER — Other Ambulatory Visit (HOSPITAL_COMMUNITY): Payer: Self-pay

## 2024-09-05 VITALS — BP 153/102 | HR 65 | Temp 97.4°F | Resp 16 | Ht 64.0 in | Wt 286.0 lb

## 2024-09-05 DIAGNOSIS — Z227 Latent tuberculosis: Secondary | ICD-10-CM

## 2024-09-05 DIAGNOSIS — M19042 Primary osteoarthritis, left hand: Secondary | ICD-10-CM | POA: Diagnosis not present

## 2024-09-05 DIAGNOSIS — M0579 Rheumatoid arthritis with rheumatoid factor of multiple sites without organ or systems involvement: Secondary | ICD-10-CM

## 2024-09-05 DIAGNOSIS — E785 Hyperlipidemia, unspecified: Secondary | ICD-10-CM | POA: Diagnosis not present

## 2024-09-05 DIAGNOSIS — J90 Pleural effusion, not elsewhere classified: Secondary | ICD-10-CM

## 2024-09-05 DIAGNOSIS — Z96651 Presence of right artificial knee joint: Secondary | ICD-10-CM

## 2024-09-05 DIAGNOSIS — Z79899 Other long term (current) drug therapy: Secondary | ICD-10-CM

## 2024-09-05 DIAGNOSIS — I10 Essential (primary) hypertension: Secondary | ICD-10-CM | POA: Diagnosis not present

## 2024-09-05 DIAGNOSIS — M19041 Primary osteoarthritis, right hand: Secondary | ICD-10-CM | POA: Diagnosis not present

## 2024-09-05 DIAGNOSIS — H209 Unspecified iridocyclitis: Secondary | ICD-10-CM

## 2024-09-05 DIAGNOSIS — G8929 Other chronic pain: Secondary | ICD-10-CM

## 2024-09-05 DIAGNOSIS — M1712 Unilateral primary osteoarthritis, left knee: Secondary | ICD-10-CM | POA: Diagnosis not present

## 2024-09-05 DIAGNOSIS — M546 Pain in thoracic spine: Secondary | ICD-10-CM | POA: Diagnosis not present

## 2024-09-05 MED ORDER — PREDNISONE 5 MG PO TABS
10.0000 mg | ORAL_TABLET | Freq: Every day | ORAL | 0 refills | Status: AC
  Filled 2024-09-05 (×2): qty 180, 90d supply, fill #0

## 2024-09-05 MED ORDER — METHOCARBAMOL 500 MG PO TABS
500.0000 mg | ORAL_TABLET | Freq: Every day | ORAL | 0 refills | Status: AC | PRN
  Filled 2024-09-05 (×2): qty 30, 30d supply, fill #0

## 2024-09-08 ENCOUNTER — Telehealth: Payer: Self-pay

## 2024-09-08 ENCOUNTER — Ambulatory Visit

## 2024-09-08 VITALS — BP 160/98 | HR 63 | Ht 64.0 in | Wt 288.2 lb

## 2024-09-08 DIAGNOSIS — J90 Pleural effusion, not elsewhere classified: Secondary | ICD-10-CM

## 2024-09-08 DIAGNOSIS — Z227 Latent tuberculosis: Secondary | ICD-10-CM

## 2024-09-08 DIAGNOSIS — M069 Rheumatoid arthritis, unspecified: Secondary | ICD-10-CM | POA: Diagnosis not present

## 2024-09-08 DIAGNOSIS — Z8709 Personal history of other diseases of the respiratory system: Secondary | ICD-10-CM

## 2024-09-08 NOTE — Progress Notes (Signed)
 "   Subjective:   PATIENT ID: Jennifer [name] Sorrel GENDER: female DOB: 17-Aug-1976, MRN: [id number]   HPI Discussed the use of AI scribe software for clinical note transcription with the patient, who gave verbal consent to proceed.  History of Present Illness Jennifer [name] is a 48 year old female with rheumatoid arthritis who presents for follow-up of her pleural effusion and latent TB treatment.  She has a history of rheumatoid arthritis and previously had a right pleural effusion, suspected to be related to her rheumatoid arthritis. She underwent a thoracentesis on July 14, 2024, where 600 mL of fluid was removed, following a previous drainage of 1.7 liters while hospitalized. A CT scan on July 15, 2024, showed residual fluid. There has been no increase in shortness of breath since the last drainage and no new imaging since then.  She has a positive quantiferon test and is currently receiving treatment with rifampin . She has been on rifampin  for approximately two months and expects to complete the course by November 16, 2024. Rifampin  is affecting her blood pressure control, and she is on three blood pressure medications, noting interference with their effectiveness.  Her rheumatoid arthritis medication, Cimzia , has been on hold pending treatment for latent TB. She is currently on prednisone , which has resulted in a weight gain of 10 pounds over two months. She describes the management of her rheumatoid arthritis without Cimzia  as 'awful'.  She traveled to [place] prior to the positive quantiferon test and is planning future travel to [place] and possibly [place]  for work. She is concerned about the impact of her medications on her ability to travel and manage her rheumatoid arthritis.     Past Medical History:  Diagnosis Date   Acute otitis media 08/28/2012   improved  change to liquid medication    Anxiety disorder 03/29/2016   Breast abscess of female    Recurrent   Depressive disorder  03/29/2016   Family history of adverse reaction to anesthesia    father hard time waking once (12/20/2016)   Fibroids    w/bleeding   GERD (gastroesophageal reflux disease)    History of blood transfusion    after surgery   Hypertension    Migraines    Dr. Layman Meissner; I have a few/month (12/20/2016)   Osteoarthritis    Pill esophagitis 08/28/2012   amoxicillin   by hx  disc plan nl voice except hoarse not drooling  close fu  if not getting better with plan stop aleve   liquid ibu onlyf necessary    Recurrent periodic urticaria    Rheumatoid arthritis (HCC)    55% of my body (12/20/2016)   Rheumatoid arthritis (HCC)    Sleep apnea    wears cpap     Family History  Problem Relation Age of Onset   Hypertension Mother    Rheum arthritis Mother    Hypertension Father    Sleep apnea Father    Breast cancer Neg Hx    Colon cancer Neg Hx    Colon polyps Neg Hx    Esophageal cancer Neg Hx    Rectal cancer Neg Hx    Stomach cancer Neg Hx      Social History   Socioeconomic History   Marital status: Single    Spouse name: Not on file   Number of children: Not on file   Years of education: Not on file   Highest education level: Some college, no degree  Occupational History   Not on file  Tobacco Use   Smoking status: Never    Passive exposure: Never   Smokeless tobacco: Never  Vaping Use   Vaping status: Never Used  Substance and Sexual Activity   Alcohol use: Not Currently    Comment: social   Drug use: Never   Sexual activity: Not on file  Other Topics Concern   Not on file  Social History Narrative   Not on file   Social Drivers of Health   Tobacco Use: Low Risk (09/08/2024)   Patient History    Smoking Tobacco Use: Never    Smokeless Tobacco Use: Never    Passive Exposure: Never  Financial Resource Strain: Low Risk (06/20/2023)   Overall Financial Resource Strain (CARDIA)    Difficulty of Paying Living Expenses: Not hard at all  Food Insecurity: No Food  Insecurity (06/24/2024)   Epic    Worried About Programme Researcher, Broadcasting/film/video in the Last Year: Never true    Ran Out of Food in the Last Year: Never true  Transportation Needs: No Transportation Needs (06/24/2024)   Epic    Lack of Transportation (Medical): No    Lack of Transportation (Non-Medical): No  Physical Activity: Inactive (06/20/2023)   Exercise Vital Sign    Days of Exercise per Week: 0 days    Minutes of Exercise per Session: 0 min  Stress: Stress Concern Present (06/20/2023)   [employer] of Occupational Health - Occupational Stress Questionnaire    Feeling of Stress : Rather much  Social Connections: Moderately Integrated (06/20/2023)   Social Connection and Isolation Panel    Frequency of Communication with Friends and Family: More than three times a week    Frequency of Social Gatherings with Friends and Family: Never    Attends Religious Services: More than 4 times per year    Active Member of Clubs or Organizations: Yes    Attends Banker Meetings: More than 4 times per year    Marital Status: Never married  Intimate Partner Violence: Not At Risk (06/24/2024)   Epic    Fear of Current or Ex-Partner: No    Emotionally Abused: No    Physically Abused: No    Sexually Abused: No  Depression (PHQ2-9): Low Risk (08/05/2024)   Depression (PHQ2-9)    PHQ-2 Score: 0  Alcohol Screen: Low Risk (06/20/2023)   Alcohol Screen    Last Alcohol Screening Score (AUDIT): 2  Housing: Low Risk (06/24/2024)   Epic    Unable to Pay for Housing in the Last Year: No    Number of Times Moved in the Last Year: 0    Homeless in the Last Year: No  Utilities: Not At Risk (06/24/2024)   Epic    Threatened with loss of utilities: No  Health Literacy: Not on file     Allergies[1]   Outpatient Medications Prior to Visit  Medication Sig Dispense Refill   amLODipine  (NORVASC ) 10 MG tablet Take 1 tablet (10 mg total) by mouth daily. 90 tablet 0   cetirizine  (ZYRTEC ) 10 MG tablet Take  10 mg by mouth daily as needed for allergies.     Levonorgestrel (KYLEENA) 19.5 MG IUD 19.5 mg by Intrauterine route once.     NON FORMULARY Pt uses cpap nightly     prednisoLONE  acetate (PRED FORTE ) 1 % ophthalmic suspension Place 1 drop into the left eye 2 (two) times daily. As needed     predniSONE  (DELTASONE ) 5 MG tablet Take 2 tablets (10 mg total) by  mouth daily with breakfast. 180 tablet 0   propranolol  ER (INDERAL  LA) 60 MG 24 hr capsule Take 1 capsule (60 mg total) by mouth daily. 30 capsule 5   rifampin  (RIFADIN ) 300 MG capsule Take 2 capsules (600 mg total) by mouth daily. 60 capsule 5   spironolactone  (ALDACTONE ) 25 MG tablet Take 0.5 tablets (12.5 mg total) by mouth daily. For high BP 30 tablet 1   SUMAtriptan  (IMITREX ) 20 MG/ACT nasal spray Place 1 spray (20 mg total) into the nose as needed for migraine or headache. May repeat in 2 hours if headache persists or recurs.  Maximum 2 sprays in 24 hours. 3 each 1   certolizumab pegol  (CIMZIA ) 2 X 200 MG KIT Inject 400 mg into the skin every 28 (twenty-eight) days. (Patient not taking: Reported on 09/05/2024)     HYDROcodone  bit-homatropine (HYCODAN) 5-1.5 MG/5ML syrup Take 5 mLs by mouth every 6 (six) hours as needed for cough. (Patient not taking: Reported on 09/05/2024) 120 mL 0   methocarbamol  (ROBAXIN ) 500 MG tablet Take 1 tablet (500 mg total) by mouth daily as needed for muscle spasms. (Patient not taking: Reported on 09/08/2024) 30 tablet 0   predniSONE  (DELTASONE ) 10 MG tablet Take by mouth. (Patient not taking: Reported on 09/08/2024)     No facility-administered medications prior to visit.    ROS Reviewed all systems and reported negative except as above     Objective:   Vitals:   09/08/24 0827  BP: (!) 158/96  Pulse: 63  SpO2: 94%  Weight: 288 lb 3.2 oz (130.7 kg)  Height: 5' 4 (1.626 m)    Physical Exam Physical Exam GENERAL: Appropriate to age, no acute distress. HEAD EYES EARS NOSE THROAT: Moist mucous  membranes, atraumatic, normocephalic. CHEST: Clear to auscultation bilaterally, no wheezing, no crackles, no rales. No pleural effusion on ultrasound. CARDIAC: Regular rate and rhythm, normal S1, normal S2, no murmurs, no rubs, no gallops. ABDOMEN: Soft, nontender. NEUROLOGICAL: Motor and sensation grossly intact, alert and oriented times X 3. EXTREMITIES: Warm, well perfused, no edema.     CBC    Component Value Date/Time   WBC 5.9 07/21/2024 1341   RBC 4.11 07/21/2024 1341   HGB 12.5 07/21/2024 1341   HGB 11.9 06/11/2018 1212   HCT 38.7 07/21/2024 1341   HCT 36.6 06/11/2018 1212   PLT 239 07/21/2024 1341   PLT 310 06/11/2018 1212   MCV 94.2 07/21/2024 1341   MCV 93 06/11/2018 1212   MCH 30.4 07/21/2024 1341   MCHC 32.3 07/21/2024 1341   RDW 12.5 07/21/2024 1341   RDW 14.4 06/11/2018 1212   LYMPHSABS 2.7 06/25/2024 0529   LYMPHSABS 2.3 06/11/2018 1212   MONOABS 0.6 06/25/2024 0529   EOSABS 47 07/21/2024 1341   EOSABS 0.2 06/11/2018 1212   BASOSABS 18 07/21/2024 1341   BASOSABS 0.0 06/11/2018 1212     Bedside US   today performed Showing no pleural effusion on the right side.           Assessment & Plan:   Assessment and Plan Assessment & Plan Rheumatoid arthritis with prior right pleural effusion Rheumatoid arthritis with history of right pleural effusion, likely due to rheumatoid arthritis. Positive quantiferon test for latent TB led to holding Cimzia . Rifampin  for latent TB causing elevated blood pressure. Prednisone  causing weight gain. Awaiting infectious disease clearance to restart Cimzia .  It appears that the pleural effusion has completely resolved.  There is no evidence of fluid on her right lung ultrasound.   -  Continue rifampin  for latent TB  - Monitor blood pressure closely due to rifampin . - Consult with infectious disease specialist for clearance to restart Cimzia . - Perform ultrasound in six months to monitor for pleural effusion  recurrence. - Advised to report any new shortness of breath immediately. - Encouraged staying active to prevent lung complications.        Zola Herter, MD [facility] Office: [phone]        [1]  Allergies Allergen Reactions   Lisinopril  Anaphylaxis   Lisinopril -Hydrochlorothiazide  Swelling and Other (See Comments)    Angioedema  Has tolerated maxide in past so most likely  The ACE inhibitor as the cause    "

## 2024-09-08 NOTE — Telephone Encounter (Signed)
 Pt presented in ov with Dr. Zaida today and has elevated BP.  Referral was placed 11/19 by Dr. Charlett.   [facility] could we get Cardiology clinic name and phone number for the pt to contact in regards to scheduling an appointment.

## 2024-09-08 NOTE — Telephone Encounter (Signed)
 Received fax from [facility] for return to work forms.  Forms have been signed by Dr. Zaida and successfully faxed back.

## 2024-09-08 NOTE — Telephone Encounter (Signed)
 Spoke with patient and she was given [facility] contact information--[phone].  Patient stated she will follow up on the referral made by Dr. Charlett

## 2024-09-08 NOTE — Patient Instructions (Addendum)
" °  VISIT SUMMARY: Today, we discussed your follow-up care for rheumatoid arthritis, pleural effusion, and latent tuberculosis (TB) treatment. We reviewed your current medications and their side effects, as well as your recent travel history and future travel plans.  YOUR PLAN: -RHEUMATOID ARTHRITIS WITH PRIOR RIGHT PLEURAL EFFUSION: Rheumatoid arthritis is an autoimmune disease that causes joint inflammation and can sometimes lead to complications like pleural effusion, which is fluid buildup around the lungs. You had a right pleural effusion that was likely due to your rheumatoid arthritis. We will continue your rifampin  treatment for latent TB until March 1st and monitor your blood pressure closely, as rifampin  can affect it. We will consult with an infectious disease specialist to get clearance to restart your Cimzia , which is currently on hold. An ultrasound will be performed in six months to check for any recurrence of pleural effusion. Please report any new shortness of breath immediately. Staying active is encouraged to prevent lung complications.  INSTRUCTIONS: Continue taking rifampin  for latent TB. Monitor your blood pressure regularly and report any significant changes. We will consult with an infectious disease specialist to determine when you can restart Cimzia . We will see you back in six months to monitor for pleural effusion recurrence. If you experience any new shortness of breath, contact us  immediately.     "

## 2024-09-09 ENCOUNTER — Telehealth: Payer: Self-pay | Admitting: Physician Assistant

## 2024-09-09 ENCOUNTER — Telehealth: Payer: Self-pay | Admitting: *Deleted

## 2024-09-09 NOTE — Telephone Encounter (Signed)
 Per Alfonso, Patient scheduled for Cimzia  on 09/17/2024 at 9:30 am.

## 2024-09-09 NOTE — Telephone Encounter (Signed)
-----   Message from Waddell CHRISTELLA Craze sent at 09/09/2024  7:55 AM EST ----- Regarding: Cimzia  Ok to restart in-office cimzia --please notify the patient and schedule visit ----- Message ----- From: Liza Charlott CROME, RPH-CPP Sent: 09/08/2024  10:00 AM EST To: Waddell CHRISTELLA Craze, PA-C; Alan JINNY Geralds, RPH-CPP#  I agree. Recommendations are to wait at least one month from starting therapy with rifampin , and she is almost 2 months into it. Ok to restart from my perspective.   Thanks! Cassie L. Kuppelweiser, PharmD, CPP [facility] Clinical Pharmacist Practitioner ----- Message ----- From: Dennise Kingsley, MD Sent: 09/08/2024   7:03 AM EST To: Waddell CHRISTELLA Craze, PA-C; Charlott CROME Kuppelweiser, #  Should be ok to start cimzia  if pt has is about 2 months into latent tb treatment. I added pharmacy for their input as well Thank ou ----- Message ----- From: Craze Waddell CHRISTELLA, PA-C Sent: 09/05/2024  12:17 PM EST To: Kingsley Dennise, MD   Hi Dr. Dennise,   I saw Jennifer [name], our mutual patient, today for a rheumatoid arthritis follow-up.  Patient is currently taking prednisone  10 mg daily but is experiencing breakthrough symptoms of a RA flare.  According to the patient she has been taking rifampin  for latent TB since early November 2025.  Our protocol is to typically hold biologic/DMARD for the first month of treatment and then resume if ok from the ID standpoint.  Please let me know if you are okay with her reinitiating Cimzia  or if you would like for her to hold off until she sees you on 09/22/2024.  Thank you,   Waddell Craze, PA-C

## 2024-09-09 NOTE — Telephone Encounter (Signed)
 Patient scheduled for Cimzia  on 09/17/2024 at 9:30 am.

## 2024-09-09 NOTE — Telephone Encounter (Signed)
-----   Message from [facility] sent at 09/08/2024 10:00 AM EST ----- I agree. Recommendations are to wait at least one month from starting therapy with rifampin , and she is almost 2 months into it. Ok to restart from my perspective.   Thanks! Cassie L. Kuppelweiser, PharmD, CPP [facility] Clinical Pharmacist Practitioner ----- Message ----- From: Dennise Kingsley, MD Sent: 09/08/2024   7:03 AM EST To: Waddell CHRISTELLA Craze, PA-C; Charlott CROME Kuppelweiser, #  Should be ok to start cimzia  if pt has is about 2 months into latent tb treatment. I added pharmacy for their input as well Thank ou ----- Message ----- From: Craze Waddell CHRISTELLA, PA-C Sent: 09/05/2024  12:17 PM EST To: Kingsley Dennise, MD   Hi Dr. Dennise,   I saw Jennifer [name], our mutual patient, today for a rheumatoid arthritis follow-up.  Patient is currently taking prednisone  10 mg daily but is experiencing breakthrough symptoms of a RA flare.  According to the patient she has been taking rifampin  for latent TB since early November 2025.  Our protocol is to typically hold biologic/DMARD for the first month of treatment and then resume if ok from the ID standpoint.  Please let me know if you are okay with her reinitiating Cimzia  or if you would like for her to hold off until she sees you on 09/22/2024.  Thank you,   Waddell Craze, PA-C

## 2024-09-15 ENCOUNTER — Other Ambulatory Visit (INDEPENDENT_AMBULATORY_CARE_PROVIDER_SITE_OTHER)

## 2024-09-15 DIAGNOSIS — I10 Essential (primary) hypertension: Secondary | ICD-10-CM

## 2024-09-15 NOTE — Progress Notes (Signed)
 "  09/15/2024 Name: Jennifer [name] MRN: [id number] DOB: 1976/09/02  Chief Complaint  Patient presents with   Medication Management   Hypertension    Jennifer [name] is a 48 y.o. year old female who presented for a telephone visit.   They were referred to the pharmacist by their PCP for assistance in managing hypertension.    Subjective:  Care Team: Primary Care Provider: Charlett Apolinar POUR, MD   Medication Access/Adherence  Current Pharmacy:  [facility] [address] [place] - [address] Jennifer [name] Jennifer [name] Phone: [phone] Fax: [phone]  [facility] - [place], [place] - [address] [place] [address] Phone: [phone] Fax: [phone]  Jennifer [name] - [facility] [address] [place] [address] Phone: [phone] Fax: [phone]   Patient reports affordability concerns with their medications: No  Patient reports access/transportation concerns to their pharmacy: No  Patient reports adherence concerns with their medications:  No     Hypertension:  Current medications: Amlodipine  10mg  daily, Spironolactone  25mg  daily Medications previously tried: Lisinopril -hydrochlorothiazide  (hives/swelling)  Patient has a validated, automated, upper arm home BP cuff Current blood pressure readings readings:155/95 at last check Saturday, denies seeing any readings with SBP below 140  Patient denies hypotensive s/sx including dizziness, lightheadedness.  Patient denies hypertensive symptoms including headache, chest pain, shortness of breath    Objective:  Lab Results  Component Value Date   HGBA1C 5.4 06/20/2023    Lab Results  Component Value Date   CREATININE 0.97 09/02/2024   BUN 14 09/02/2024   NA 140 09/02/2024   K 4.1 09/02/2024   CL 105 09/02/2024   CO2 30 09/02/2024    Lab Results  Component Value Date   CHOL 181 05/22/2024   HDL 70  05/22/2024   LDLCALC 96 05/22/2024   TRIG 66 05/22/2024   CHOLHDL 2.6 05/22/2024    Medications Reviewed Today     Reviewed by Lionell Jon DEL, RPH (Pharmacist) on 09/15/24 at 1610  Med List Status: <None>   Medication Order Taking? Sig Documenting Provider Last Dose Status Informant  amLODipine  (NORVASC ) 10 MG tablet [id number] Yes Take 1 tablet (10 mg total) by mouth daily. [place], Camellia PARAS, DO  Active   certolizumab pegol  (CIMZIA ) 2 X 200 MG KIT [id number]  Inject 400 mg into the skin every 28 (twenty-eight) days.  Patient not taking: Reported on 09/05/2024   [provider]  Active Self, Pharmacy Records  cetirizine  (ZYRTEC ) 10 MG tablet [id number] Yes Take 10 mg by mouth daily as needed for allergies. [provider]  Active Self, Pharmacy Records    Discontinued 11/29/20 1911   HYDROcodone  bit-homatropine (HYCODAN) 5-1.5 MG/5ML syrup [id number]  Take 5 mLs by mouth every 6 (six) hours as needed for cough.  Patient not taking: Reported on 09/05/2024   [place], Eric J, DO  Active   Levonorgestrel [facility]) 19.5 MG IUD [id number] Yes 19.5 mg by Intrauterine route once. [provider]  Active Self, Pharmacy Records           Med Note RENNIS, DUROJAHYE' R   Mon Jun 23, 2024 10:09 PM) IUD is currently in the patient  methocarbamol  (ROBAXIN ) 500 MG tablet [id number]  Take 1 tablet (500 mg total) by mouth daily as needed for muscle spasms.  Patient not taking: Reported on 09/08/2024   Cheryl Waddell HERO, PA-C  Active  Med Note [facility], NOEL A   Mon Sep 08, 2024  8:25 AM) Pt has rx but not started yet.   NON FORMULARY [id number] Yes Pt uses cpap nightly [provider]  Active Self, Pharmacy Records  prednisoLONE  acetate (PRED FORTE ) 1 % ophthalmic suspension [id number] Yes Place 1 drop into the left eye 2 (two) times daily. As needed [provider]  Active Self, Pharmacy Records  predniSONE  (DELTASONE ) 10 MG tablet [id number]  Take by mouth.   Patient not taking: Reported on 09/08/2024   [provider]  Active   predniSONE  (DELTASONE ) 5 MG tablet [id number] Yes Take 2 tablets (10 mg total) by mouth daily with breakfast. Cheryl Waddell HERO, PA-C  Active   propranolol  ER (INDERAL  LA) 60 MG 24 hr capsule [id number] Yes Take 1 capsule (60 mg total) by mouth daily. Skeet Juliene SAUNDERS, DO  Active   rifampin  (RIFADIN ) 300 MG capsule [id number] Yes Take 2 capsules (600 mg total) by mouth daily. Dennise Kingsley, MD  Active   spironolactone  (ALDACTONE ) 25 MG tablet [id number] Yes Take 0.5 tablets (12.5 mg total) by mouth daily. For high BP Panosh, Wanda K, MD  Active   SUMAtriptan  (IMITREX ) 20 MG/ACT nasal spray [id number] Yes Place 1 spray (20 mg total) into the nose as needed for migraine or headache. May repeat in 2 hours if headache persists or recurs.  Maximum 2 sprays in 24 hours. Skeet Juliene SAUNDERS, DO  Active               Assessment/Plan:   Hypertension: - Currently uncontrolled - Reviewed [name] term cardiovascular and renal outcomes of uncontrolled blood pressure - Reviewed appropriate blood pressure monitoring technique and reviewed goal blood pressure. Recommended to check home blood pressure and heart rate daily -Recommend increasing Spironolactone  to 50mg  and recheck CMP in 2 weeks, will discuss with PCP and notify patient -Counseled to limit sodium/caffeine intake -Patient also would like to start zepbound if possible, will coordinate with PCP as well, notified patient it will need a prior auth  Follow Up Plan: 2 weeks  Jon VEAR Lindau, PharmD Clinical Pharmacist [phone]  "

## 2024-09-16 ENCOUNTER — Other Ambulatory Visit: Payer: Self-pay

## 2024-09-16 ENCOUNTER — Other Ambulatory Visit (HOSPITAL_COMMUNITY): Payer: Self-pay

## 2024-09-16 MED ORDER — SPIRONOLACTONE 50 MG PO TABS
50.0000 mg | ORAL_TABLET | Freq: Every day | ORAL | 1 refills | Status: AC
  Filled 2024-09-16: qty 30, 30d supply, fill #0
  Filled 2024-10-16: qty 30, 30d supply, fill #1

## 2024-09-16 NOTE — Addendum Note (Signed)
 Addended by: LIONELL JON DEL on: 09/16/2024 08:58 AM   Modules accepted: Orders

## 2024-09-17 ENCOUNTER — Ambulatory Visit

## 2024-09-17 VITALS — BP 152/89 | HR 65 | Temp 98.1°F

## 2024-09-17 DIAGNOSIS — M0579 Rheumatoid arthritis with rheumatoid factor of multiple sites without organ or systems involvement: Secondary | ICD-10-CM

## 2024-09-17 MED ORDER — CERTOLIZUMAB PEGOL 2 X 200 MG ~~LOC~~ KIT
400.0000 mg | PACK | Freq: Once | SUBCUTANEOUS | Status: AC
  Administered 2024-09-17: 400 mg via SUBCUTANEOUS

## 2024-09-17 NOTE — Progress Notes (Signed)
 Subjective:   Patient presents to clinic today to receive monthly dose of Cimzia .  Patient running a fever or have signs/symptoms of infection? No  Patient currently on antibiotics for the treatment of infection? No  Patient have any upcoming invasive procedures/surgeries? No  Objective: CMP     Component Value Date/Time   NA 140 09/02/2024 1604   NA 138 06/11/2018 1212   K 4.1 09/02/2024 1604   CL 105 09/02/2024 1604   CO2 30 09/02/2024 1604   GLUCOSE 79 09/02/2024 1604   BUN 14 09/02/2024 1604   BUN 9 06/11/2018 1212   CREATININE 0.97 09/02/2024 1604   CREATININE 0.94 07/21/2024 1341   CALCIUM 9.8 09/02/2024 1604   PROT 9.2 (H) 07/21/2024 1341   PROT 8.0 06/11/2018 1212   ALBUMIN 3.5 06/25/2024 0529   ALBUMIN 3.8 06/11/2018 1212   AST 15 07/21/2024 1341   ALT 13 07/21/2024 1341   ALKPHOS 84 06/25/2024 0529   BILITOT 0.5 07/21/2024 1341   BILITOT 0.3 06/11/2018 1212   GFRNONAA >60 06/26/2024 0524   GFRNONAA 72 07/02/2020 1106   GFRAA 84 07/02/2020 1106    CBC    Component Value Date/Time   WBC 5.9 07/21/2024 1341   RBC 4.11 07/21/2024 1341   HGB 12.5 07/21/2024 1341   HGB 11.9 06/11/2018 1212   HCT 38.7 07/21/2024 1341   HCT 36.6 06/11/2018 1212   PLT 239 07/21/2024 1341   PLT 310 06/11/2018 1212   MCV 94.2 07/21/2024 1341   MCV 93 06/11/2018 1212   MCH 30.4 07/21/2024 1341   MCHC 32.3 07/21/2024 1341   RDW 12.5 07/21/2024 1341   RDW 14.4 06/11/2018 1212   LYMPHSABS 2.7 06/25/2024 0529   LYMPHSABS 2.3 06/11/2018 1212   MONOABS 0.6 06/25/2024 0529   EOSABS 47 07/21/2024 1341   EOSABS 0.2 06/11/2018 1212   BASOSABS 18 07/21/2024 1341   BASOSABS 0.0 06/11/2018 1212    Baseline Immunosuppressant Therapy Labs TB GOLD    Latest Ref Rng & Units 07/09/2024   10:22 AM  Quantiferon TB Gold  Quantiferon TB Gold Plus NEGATIVE POSITIVE    Hepatitis Panel    Latest Ref Rng & Units 06/13/2019    2:46 PM  Hepatitis  Hep B Surface Ag NON-REACTI NON-REACTIVE    Hep B IgM NON-REACTI NON-REACTIVE   Hep C Ab NON-REACTI NON-REACTIVE   Hep A IgM NON-REACTI NON-REACTIVE    HIV Lab Results  Component Value Date   HIV Non Reactive 06/24/2024   HIV NON-REACTIVE 02/18/2020   Immunoglobulins    Latest Ref Rng & Units 02/18/2020    9:04 AM  Immunoglobulin Electrophoresis  IgA  47 - 310 mg/dL 348   IgG 399 - 8,359 mg/dL 7,781   IgM 50 - 699 mg/dL 78    SPEP    Latest Ref Rng & Units 07/21/2024    1:41 PM  Serum Protein Electrophoresis  Total Protein 6.1 - 8.1 g/dL 9.2    H3EI No results found for: G6PDH TPMT No results found for: TPMT   Chest x-ray: 1. Small right pleural effusion, decreased in volume, with overlying subsegmental atelectasis. 2. Interval resolution of right middle lobe atelectasis. 3. Persistent enlarged mediastinal and right hilar lymph nodes. Etiology indeterminate. Differential considerations include reactive adenopathy, granulomatous inflammation or infection, lipoproliferative disorder or metastatic disease.  Assessment/Plan:   Administrations This Visit     certolizumab pegol  (CIMZIA ) kit 400 mg     Admin Date 09/17/2024 Action Given Dose 400 mg  Route Subcutaneous Documented By Jacoria Keiffer M, CMA             Patient tolerated injection well.   Appointment for next injection scheduled for January 28th 3PM.  Patient due for labs in February 2026.  Patient is to call and reschedule appointment if running a fever with signs/symptoms of infection, on antibiotics for active infection or has an upcoming invasive procedure.  All questions encouraged and answered.  Instructed patient to call with any further questions or concerns.

## 2024-09-22 ENCOUNTER — Other Ambulatory Visit: Payer: Self-pay

## 2024-09-22 ENCOUNTER — Encounter: Payer: Self-pay | Admitting: Internal Medicine

## 2024-09-22 ENCOUNTER — Ambulatory Visit: Admitting: Internal Medicine

## 2024-09-22 VITALS — BP 156/88 | HR 69 | Temp 97.6°F | Ht 64.0 in | Wt 286.0 lb

## 2024-09-22 DIAGNOSIS — R7612 Nonspecific reaction to cell mediated immunity measurement of gamma interferon antigen response without active tuberculosis: Secondary | ICD-10-CM | POA: Diagnosis not present

## 2024-09-22 DIAGNOSIS — J90 Pleural effusion, not elsewhere classified: Secondary | ICD-10-CM | POA: Diagnosis not present

## 2024-09-22 DIAGNOSIS — M069 Rheumatoid arthritis, unspecified: Secondary | ICD-10-CM | POA: Diagnosis not present

## 2024-09-22 MED ORDER — AMLODIPINE BESYLATE 10 MG PO TABS: 10.0000 mg | ORAL_TABLET | Freq: Every day | ORAL | 3 refills | Status: AC

## 2024-09-22 NOTE — Progress Notes (Unsigned)
 "     Patient: Jennifer [name]  DOB: 1976-09-06 MRN: [id number] PCP: Charlett Apolinar POUR, MD   Patient Active Problem List   Diagnosis Date Noted   Pleural effusion 06/23/2024   Hidradenitis suppurativa 11/23/2022   OSA (obstructive sleep apnea) 02/23/2022   Morbid obesity (HCC) 02/23/2022   History of total knee replacement, right 07/22/2021   Primary osteoarthritis of right knee 03/28/2021   OA (osteoarthritis) of knee 04/02/2020   Pain in right knee 01/13/2020   Chronic pain of right knee 01/21/2019   S/P right knee arthroscopy 07/23/2018   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   Primary osteoarthritis of both hands 02/04/2018   Sleep apnea, obstructive 09/28/2017   Primary osteoarthritis of both knees 05/22/2017   Chronic migraine without aura without status migrainosus, not intractable 01/18/2017   Incisional hernia 12/18/2016   Leiomyoma of uterus 09/06/2016   Anxiety disorder 03/29/2016   Depressive disorder 03/29/2016   Fibroid, uterine    Essential hypertension 05/25/2015   Recurrent headache 05/25/2015   Head lump 07/31/2014   Edema 12/29/2013   Baker's cyst, ruptured 12/25/2013   Cough, persistent 12/12/2013   Left leg swelling 12/12/2013   Cough 12/12/2013   High risk medication use 12/12/2013   Recurrent periodic urticaria    Acne vulgaris 11/21/2012   Postinflammatory hyperpigmentation 11/21/2012   Rheumatoid arthritis (HCC) 08/28/2012   CHRONIC LARYNGITIS 11/03/2010   Allergic rhinitis 11/03/2010   PARESTHESIA 07/28/2010   DYSMENORRHEA 10/15/2007   FIBROIDS, UTERUS 07/24/2007   OBESITY 07/24/2007   SLEEPLESSNESS 07/24/2007   HEADACHE 07/24/2007     Subjective:  Jennifer [name] is a 49 y.o. F with past medical history of otitis media, anxiety/depression, recurrent breast abscess, GERD, migraines, RA presents for management of Positive QuantiFERON QuantiFERON was positive on 10/14 and 10/22.  Has been negative in the past last negative was 07/09/2023.  She is followed  by rheumatology Dr. Marea for 4 rheumatoid arthritis at multiple sites and iritis, well-controlled on Cimzia  injections.  She developed a nonproductive cough about 2 weeks ago found to have a pleural effusion underwent thoracentesis with negative cultures of note AFB culture x 2 negative.  She is following pulmonology as well her effusion likely secondary to rheumatoid arthritis.  Sent to ID for management of latent TB. 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  She has not worked at a skilled facility although she does have a healthcare exposure her medical history.  today: doing well. Tolerating rifapmin. Missed about 3 days of rifampin .   ROS  Past Medical History:  Diagnosis Date   Acute otitis media 08/28/2012   improved  change to liquid medication    Anxiety disorder 03/29/2016   Breast abscess of female    Recurrent   Depressive disorder 03/29/2016   Family history of adverse reaction to anesthesia    father hard time waking once (12/20/2016)   Fibroids    w/bleeding   GERD (gastroesophageal reflux disease)    History of blood transfusion    after surgery   Hypertension    Migraines    Dr. Layman Meissner; I have a few/month (12/20/2016)   Osteoarthritis    Pill esophagitis 08/28/2012   amoxicillin   by hx  disc plan nl voice except hoarse not drooling  close fu  if not getting better with plan stop aleve   liquid ibu onlyf necessary    Recurrent periodic urticaria    Rheumatoid arthritis (HCC)    55%  of my body (12/20/2016)   Rheumatoid arthritis (HCC)    Sleep apnea    wears cpap    Outpatient Medications Prior to Visit  Medication Sig Dispense Refill   amLODipine  (NORVASC ) 10 MG tablet Take 1 tablet (10 mg total) by mouth daily. 90 tablet 0   certolizumab pegol  (CIMZIA ) 2 X 200 MG KIT Inject 400 mg into the skin every 28 (twenty-eight) days. (Patient not taking: Reported on 09/05/2024)     cetirizine  (ZYRTEC ) 10 MG tablet Take 10 mg by  mouth daily as needed for allergies.     HYDROcodone  bit-homatropine (HYCODAN) 5-1.5 MG/5ML syrup Take 5 mLs by mouth every 6 (six) hours as needed for cough. (Patient not taking: Reported on 09/05/2024) 120 mL 0   Levonorgestrel (KYLEENA) 19.5 MG IUD 19.5 mg by Intrauterine route once.     methocarbamol  (ROBAXIN ) 500 MG tablet Take 1 tablet (500 mg total) by mouth daily as needed for muscle spasms. (Patient not taking: Reported on 09/08/2024) 30 tablet 0   NON FORMULARY Pt uses cpap nightly     prednisoLONE  acetate (PRED FORTE ) 1 % ophthalmic suspension Place 1 drop into the left eye 2 (two) times daily. As needed     predniSONE  (DELTASONE ) 10 MG tablet Take by mouth. (Patient not taking: Reported on 09/08/2024)     predniSONE  (DELTASONE ) 5 MG tablet Take 2 tablets (10 mg total) by mouth daily with breakfast. 180 tablet 0   propranolol  ER (INDERAL  LA) 60 MG 24 hr capsule Take 1 capsule (60 mg total) by mouth daily. 30 capsule 5   rifampin  (RIFADIN ) 300 MG capsule Take 2 capsules (600 mg total) by mouth daily. 60 capsule 5   spironolactone  (ALDACTONE ) 50 MG tablet Take 1 tablet (50 mg total) by mouth daily. 30 tablet 1   SUMAtriptan  (IMITREX ) 20 MG/ACT nasal spray Place 1 spray (20 mg total) into the nose as needed for migraine or headache. May repeat in 2 hours if headache persists or recurs.  Maximum 2 sprays in 24 hours. 3 each 1   No facility-administered medications prior to visit.     Allergies[1]  Social History[2]  Family History  Problem Relation Age of Onset   Hypertension Mother    Rheum arthritis Mother    Hypertension Father    Sleep apnea Father    Breast cancer Neg Hx    Colon cancer Neg Hx    Colon polyps Neg Hx    Esophageal cancer Neg Hx    Rectal cancer Neg Hx    Stomach cancer Neg Hx     Objective:  There were no vitals filed for this visit. There is no height or weight on file to calculate BMI.  Physical Exam  Lab Results: Lab Results  Component Value  Date   WBC 5.9 07/21/2024   HGB 12.5 07/21/2024   HCT 38.7 07/21/2024   MCV 94.2 07/21/2024   PLT 239 07/21/2024    Lab Results  Component Value Date   CREATININE 0.97 09/02/2024   BUN 14 09/02/2024   NA 140 09/02/2024   K 4.1 09/02/2024   CL 105 09/02/2024   CO2 30 09/02/2024    Lab Results  Component Value Date   ALT 13 07/21/2024   AST 15 07/21/2024   ALKPHOS 84 06/25/2024   BILITOT 0.5 07/21/2024     Assessment & Plan:  #IGRA positive x 2-latent TB #Rheumatoid arthritis On Cimzia , currently held #Pleural effusion s/p thoracentesis with negative AFB-followed by pulmonology -  Patient had negative QuantiFERON till October 24.  She went to [place] in June.  At end of September she developed a cough, at 2 undergo thoracentesis and was hospitalized.  Thought to be secondary to rheumatoid arthritis. - CT chest on 07/15/2024 showed small pleural effusion ditching sides.  Enlarged mediastinal and right hilar lymph nodes. - She is followed by pulmonology and has undergone paracentesis x 2 with negative AFB on 10/7, 10/27 -Rifampin  held for 3 days due to bp issues now back on it.  Plan: - Plan to treat with rifampin  x 4 months, started 11/3, eot 3/6/6 -labs today -Follow-up with ID in 2 months at end of treatment  Loney Stank, MD [facility]   09/22/2024  8:52 AM     [1]  Allergies Allergen Reactions   Lisinopril  Anaphylaxis   Lisinopril -Hydrochlorothiazide  Swelling and Other (See Comments)    Angioedema  Has tolerated maxide in past so most likely  The ACE inhibitor as the cause   [2]  Social History Tobacco Use   Smoking status: Never    Passive exposure: Never   Smokeless tobacco: Never  Vaping Use   Vaping status: Never Used  Substance Use Topics   Alcohol use: Not Currently    Comment: social   Drug use: Never   "

## 2024-09-23 ENCOUNTER — Ambulatory Visit: Admitting: Neurology

## 2024-09-23 LAB — CBC WITH DIFFERENTIAL/PLATELET
Absolute Lymphocytes: 2094 {cells}/uL (ref 850–3900)
Absolute Monocytes: 492 {cells}/uL (ref 200–950)
Basophils Absolute: 30 {cells}/uL (ref 0–200)
Basophils Relative: 0.8 %
Eosinophils Absolute: 200 {cells}/uL (ref 15–500)
Eosinophils Relative: 5.4 %
HCT: 37.1 % (ref 35.9–46.0)
Hemoglobin: 11.9 g/dL (ref 11.7–15.5)
MCH: 29.6 pg (ref 27.0–33.0)
MCHC: 32.1 g/dL (ref 31.6–35.4)
MCV: 92.3 fL (ref 81.4–101.7)
MPV: 11 fL (ref 7.5–12.5)
Monocytes Relative: 13.3 %
Neutro Abs: 884 {cells}/uL — ABNORMAL LOW (ref 1500–7800)
Neutrophils Relative %: 23.9 %
Platelets: 268 Thousand/uL (ref 140–400)
RBC: 4.02 Million/uL (ref 3.80–5.10)
RDW: 12.2 % (ref 11.0–15.0)
Total Lymphocyte: 56.6 %
WBC: 3.7 Thousand/uL — ABNORMAL LOW (ref 3.8–10.8)

## 2024-09-23 LAB — COMPLETE METABOLIC PANEL WITHOUT GFR
AG Ratio: 0.8 (calc) — ABNORMAL LOW (ref 1.0–2.5)
ALT: 11 U/L (ref 6–29)
AST: 15 U/L (ref 10–35)
Albumin: 3.7 g/dL (ref 3.6–5.1)
Alkaline phosphatase (APISO): 101 U/L (ref 31–125)
BUN: 10 mg/dL (ref 7–25)
CO2: 25 mmol/L (ref 20–32)
Calcium: 9 mg/dL (ref 8.6–10.2)
Chloride: 106 mmol/L (ref 98–110)
Creat: 0.84 mg/dL (ref 0.50–0.99)
Globulin: 4.6 g/dL — ABNORMAL HIGH (ref 1.9–3.7)
Glucose, Bld: 104 mg/dL — ABNORMAL HIGH (ref 65–99)
Potassium: 5 mmol/L (ref 3.5–5.3)
Sodium: 139 mmol/L (ref 135–146)
Total Bilirubin: 0.5 mg/dL (ref 0.2–1.2)
Total Protein: 8.3 g/dL — ABNORMAL HIGH (ref 6.1–8.1)

## 2024-09-29 ENCOUNTER — Telehealth: Payer: Self-pay

## 2024-09-29 ENCOUNTER — Other Ambulatory Visit

## 2024-09-29 NOTE — Telephone Encounter (Signed)
 Called [employer] for pre-certification for in-office Cimzia  [phone]). Current authorization expires on 10/18/2024. Clinical questions completed over the phone. Clinicals sent via fax  Phone: [phone] Fax: [phone] Case # [id number]  Sherry Pennant, PharmD, MPH, BCPS, CPP Clinical Pharmacist

## 2024-09-29 NOTE — Progress Notes (Signed)
" ° °  09/29/2024  Patient ID: Jennifer [name], female   DOB: 03-05-1976, 49 y.o.   MRN: [id number]  Attempted to contact patient for scheduled appointment for medication management. Left HIPAA compliant message for patient to return my call at their convenience.   Jon VEAR Lindau, PharmD Clinical Pharmacist [phone]   "

## 2024-10-07 NOTE — Telephone Encounter (Signed)
 Cimzia  vial approved for [id number]. Dose 400mg  every 4 weeks x 1 year (400 units per dose for total of 5200 units)  Approval dates: 10/19/2024 through 10/18/2025  Request # [id number]

## 2024-10-15 ENCOUNTER — Ambulatory Visit

## 2024-10-15 VITALS — BP 143/87 | HR 67 | Temp 97.2°F

## 2024-10-15 DIAGNOSIS — M0579 Rheumatoid arthritis with rheumatoid factor of multiple sites without organ or systems involvement: Secondary | ICD-10-CM

## 2024-10-15 MED ORDER — CERTOLIZUMAB PEGOL 2 X 200 MG ~~LOC~~ KIT
400.0000 mg | PACK | Freq: Once | SUBCUTANEOUS | Status: AC
  Administered 2024-10-15: 400 mg via SUBCUTANEOUS

## 2024-10-15 NOTE — Progress Notes (Signed)
 Subjective:   Patient presents to clinic today to receive monthly dose of Cimzia .  Patient running a fever or have signs/symptoms of infection? No  Patient currently on antibiotics for the treatment of infection? No  Patient have any upcoming invasive procedures/surgeries? No  Objective: CMP     Component Value Date/Time   NA 139 09/22/2024 0921   NA 138 06/11/2018 1212   K 5.0 09/22/2024 0921   CL 106 09/22/2024 0921   CO2 25 09/22/2024 0921   GLUCOSE 104 (H) 09/22/2024 0921   BUN 10 09/22/2024 0921   BUN 9 06/11/2018 1212   CREATININE 0.84 09/22/2024 0921   CALCIUM 9.0 09/22/2024 0921   PROT 8.3 (H) 09/22/2024 0921   PROT 8.0 06/11/2018 1212   ALBUMIN 3.5 06/25/2024 0529   ALBUMIN 3.8 06/11/2018 1212   AST 15 09/22/2024 0921   ALT 11 09/22/2024 0921   ALKPHOS 84 06/25/2024 0529   BILITOT 0.5 09/22/2024 0921   BILITOT 0.3 06/11/2018 1212   GFRNONAA >60 06/26/2024 0524   GFRNONAA 72 07/02/2020 1106   GFRAA 84 07/02/2020 1106    CBC    Component Value Date/Time   WBC 3.7 (L) 09/22/2024 0921   RBC 4.02 09/22/2024 0921   HGB 11.9 09/22/2024 0921   HGB 11.9 06/11/2018 1212   HCT 37.1 09/22/2024 0921   HCT 36.6 06/11/2018 1212   PLT 268 09/22/2024 0921   PLT 310 06/11/2018 1212   MCV 92.3 09/22/2024 0921   MCV 93 06/11/2018 1212   MCH 29.6 09/22/2024 0921   MCHC 32.1 09/22/2024 0921   RDW 12.2 09/22/2024 0921   RDW 14.4 06/11/2018 1212   LYMPHSABS 2.7 06/25/2024 0529   LYMPHSABS 2.3 06/11/2018 1212   MONOABS 0.6 06/25/2024 0529   EOSABS 200 09/22/2024 0921   EOSABS 0.2 06/11/2018 1212   BASOSABS 30 09/22/2024 0921   BASOSABS 0.0 06/11/2018 1212    Baseline Immunosuppressant Therapy Labs TB GOLD    Latest Ref Rng & Units 07/09/2024   10:22 AM  Quantiferon TB Gold  Quantiferon TB Gold Plus NEGATIVE POSITIVE    Hepatitis Panel    Latest Ref Rng & Units 06/13/2019    2:46 PM  Hepatitis  Hep B Surface Ag NON-REACTI NON-REACTIVE   Hep B IgM NON-REACTI  NON-REACTIVE   Hep C Ab NON-REACTI NON-REACTIVE   Hep A IgM NON-REACTI NON-REACTIVE    HIV Lab Results  Component Value Date   HIV Non Reactive 06/24/2024   HIV NON-REACTIVE 02/18/2020   Immunoglobulins    Latest Ref Rng & Units 02/18/2020    9:04 AM  Immunoglobulin Electrophoresis  IgA  47 - 310 mg/dL 348   IgG 399 - 8,359 mg/dL 7,781   IgM 50 - 699 mg/dL 78    SPEP    Latest Ref Rng & Units 09/22/2024    9:21 AM  Serum Protein Electrophoresis  Total Protein 6.1 - 8.1 g/dL 8.3    H3EI No results found for: G6PDH TPMT No results found for: TPMT   Chest x-ray: 1. Small right pleural effusion, decreased in volume, with overlying subsegmental atelectasis. 2. Interval resolution of right middle lobe atelectasis. 3. Persistent enlarged mediastinal and right hilar lymph nodes. Etiology indeterminate. Differential considerations include reactive adenopathy, granulomatous inflammation or infection, lipoproliferative disorder or metastatic disease.  Assessment/Plan:   Administrations This Visit     certolizumab pegol  (CIMZIA ) kit 400 mg     Admin Date 10/15/2024 Action Given Dose 400 mg Route Subcutaneous Documented By  Kash Mothershead M, CMA             Patient tolerated injection  well.   Appointment for next injection scheduled for November 19, 2024.  Patient due for labs in February 2026.  Patient is to call and reschedule appointment if running a fever with signs/symptoms of infection, on antibiotics for active infection or has an upcoming invasive procedure.  All questions encouraged and answered.  Instructed patient to call with any further questions or concerns.

## 2024-10-23 NOTE — Progress Notes (Unsigned)
 "  Office Visit Note  Patient: Jennifer [name]             Date of Birth: 10-13-1975           MRN: [id number]             PCP: Charlett Apolinar POUR, MD Referring: Charlett Apolinar POUR, MD Visit Date: 11/06/2024 Occupation: Data Unavailable  Subjective:  No chief complaint on file.   History of Present Illness: Jennifer [name] is a 49 y.o. female ***     Activities of Daily Living:  Patient reports morning stiffness for *** {minute/hour:19697}.   Patient {ACTIONS;DENIES/REPORTS:21021675::Denies} nocturnal pain.  Difficulty dressing/grooming: {ACTIONS;DENIES/REPORTS:21021675::Denies} Difficulty climbing stairs: {ACTIONS;DENIES/REPORTS:21021675::Denies} Difficulty getting out of chair: {ACTIONS;DENIES/REPORTS:21021675::Denies} Difficulty using hands for taps, buttons, cutlery, and/or writing: {ACTIONS;DENIES/REPORTS:21021675::Denies}  No Rheumatology ROS completed.   PMFS History:  Patient Active Problem List   Diagnosis Date Noted   Pleural effusion 06/23/2024   Hidradenitis suppurativa 11/23/2022   OSA (obstructive sleep apnea) 02/23/2022   Morbid obesity (HCC) 02/23/2022   History of total knee replacement, right 07/22/2021   Primary osteoarthritis of right knee 03/28/2021   OA (osteoarthritis) of knee 04/02/2020   Pain in right knee 01/13/2020   Chronic pain of right knee 01/21/2019   S/P right knee arthroscopy 07/23/2018   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   Primary osteoarthritis of both hands 02/04/2018   Sleep apnea, obstructive 09/28/2017   Primary osteoarthritis of both knees 05/22/2017   Chronic migraine without aura without status migrainosus, not intractable 01/18/2017   Incisional hernia 12/18/2016   Leiomyoma of uterus 09/06/2016   Anxiety disorder 03/29/2016   Depressive disorder 03/29/2016   Fibroid, uterine    Essential hypertension 05/25/2015   Recurrent headache 05/25/2015   Head lump 07/31/2014   Edema 12/29/2013   Baker's cyst, ruptured 12/25/2013    Cough, persistent 12/12/2013   Left leg swelling 12/12/2013   Cough 12/12/2013   High risk medication use 12/12/2013   Recurrent periodic urticaria    Acne vulgaris 11/21/2012   Postinflammatory hyperpigmentation 11/21/2012   Rheumatoid arthritis (HCC) 08/28/2012   CHRONIC LARYNGITIS 11/03/2010   Allergic rhinitis 11/03/2010   PARESTHESIA 07/28/2010   DYSMENORRHEA 10/15/2007   FIBROIDS, UTERUS 07/24/2007   OBESITY 07/24/2007   SLEEPLESSNESS 07/24/2007   HEADACHE 07/24/2007    Past Medical History:  Diagnosis Date   Acute otitis media 08/28/2012   improved  change to liquid medication    Anxiety disorder 03/29/2016   Breast abscess of female    Recurrent   Depressive disorder 03/29/2016   Family history of adverse reaction to anesthesia    father hard time waking once (12/20/2016)   Fibroids    w/bleeding   GERD (gastroesophageal reflux disease)    History of blood transfusion    after surgery   Hypertension    Migraines    Dr. Layman Meissner; I have a few/month (12/20/2016)   Osteoarthritis    Pill esophagitis 08/28/2012   amoxicillin   by hx  disc plan nl voice except hoarse not drooling  close fu  if not getting better with plan stop aleve   liquid ibu onlyf necessary    Recurrent periodic urticaria    Rheumatoid arthritis (HCC)    55% of my body (12/20/2016)   Rheumatoid arthritis (HCC)    Sleep apnea    wears cpap    Family History  Problem Relation Age of Onset   Hypertension Mother    Rheum arthritis Mother    Hypertension Father  Sleep apnea Father    Breast cancer Neg Hx    Colon cancer Neg Hx    Colon polyps Neg Hx    Esophageal cancer Neg Hx    Rectal cancer Neg Hx    Stomach cancer Neg Hx    Past Surgical History:  Procedure Laterality Date   BREAST SURGERY     abcess under left breast    CYST EXCISION Left 2014   elbow   HERNIA REPAIR     INCISION AND DRAINAGE BREAST ABSCESS Left 2003   INCISIONAL HERNIA REPAIR N/A 12/18/2016   Procedure:  REPAIR INCISIONAL HERNIA WITH MESH;  Surgeon: Dann Hummer, MD;  Location: [facility] OR;  Service: General;  Laterality: N/A;   INSERTION OF MESH N/A 12/18/2016   Procedure: INSERTION OF MESH;  Surgeon: Dann Hummer, MD;  Location: [facility] OR;  Service: General;  Laterality: N/A;   IR THORACENTESIS RIGHT ASP PLEURAL SPACE W/IMG GUIDE  06/24/2024   IR THORACENTESIS RIGHT ASP PLEURAL SPACE W/IMG GUIDE  07/14/2024   KNEE ARTHROSCOPY WITH MENISCAL REPAIR Right 06/17/2018   Procedure: RIGHT KNEE ARTHROSCOPY WITH MENISCAL REPAIR;  Surgeon: Rubie Kemps, MD;  Location: [facility];  Service: Orthopedics;  Laterality: Right;   MYOMECTOMY N/A 09/06/2016   Procedure: EZZIE MERL;  Surgeon: Cynthia Loss, MD;  Location: [facility];  Service: Gynecology;  Laterality: N/A;   ROOT CANAL  05/2022   TOTAL KNEE ARTHROPLASTY Right 03/28/2021   Procedure: TOTAL KNEE ARTHROPLASTY;  Surgeon: Melodi Lerner, MD;  Location: [facility];  Service: Orthopedics;  Laterality: Right;    UTERINE FIBROID SURGERY     34 fibroids   Social History[1] Social History   Social History Narrative   Not on file     Immunization History  Administered Date(s) Administered   Influenza Split 08/21/2012   Influenza,inj,Quad PF,6+ Mos 07/31/2014, 08/19/2019   Pneumococcal Conjugate-13 08/28/2012   Unspecified SARS-COV-2 Vaccination 01/09/2020, 01/30/2020, 09/24/2020     Objective: Vital Signs: There were no vitals taken for this visit.   Physical Exam   Musculoskeletal Exam: ***  CDAI Exam: CDAI Score: -- Patient Global: --; Provider Global: -- Swollen: --; Tender: -- Joint Exam 11/06/2024   No joint exam has been documented for this visit   There is currently no information documented on the homunculus. Go to the Rheumatology activity and complete the homunculus joint exam.  Investigation: No additional findings.  Imaging: No results found.  Recent Labs: Lab Results  Component Value Date   WBC 3.7 (L)  09/22/2024   HGB 11.9 09/22/2024   PLT 268 09/22/2024   NA 139 09/22/2024   K 5.0 09/22/2024   CL 106 09/22/2024   CO2 25 09/22/2024   GLUCOSE 104 (H) 09/22/2024   BUN 10 09/22/2024   CREATININE 0.84 09/22/2024   BILITOT 0.5 09/22/2024   ALKPHOS 84 06/25/2024   AST 15 09/22/2024   ALT 11 09/22/2024   PROT 8.3 (H) 09/22/2024   ALBUMIN 3.5 06/25/2024   CALCIUM 9.0 09/22/2024   GFRAA 84 07/02/2020   QFTBGOLDPLUS POSITIVE (A) 07/09/2024    Speciality Comments: Prior therapy: Plaquenil (inadequate response) Methotrexate  06/2019-03/2022  Patient is needs to be seen in the office every 3 months to get Cimzia  injections.  Procedures:  No procedures performed Allergies: Lisinopril  and Lisinopril -hydrochlorothiazide    Assessment / Plan:     Visit Diagnoses: Rheumatoid arthritis involving multiple sites with positive rheumatoid factor (HCC)  High risk medication use  Pleural effusion  Iritis  Primary osteoarthritis of  both hands  Primary osteoarthritis of left knee  S/P total knee arthroplasty, right  Essential hypertension  Dyslipidemia  TB lung, latent  Chronic right-sided thoracic back pain  Orders: No orders of the defined types were placed in this encounter.  No orders of the defined types were placed in this encounter.   Face-to-face time spent with patient was *** minutes. Greater than 50% of time was spent in counseling and coordination of care.  Follow-Up Instructions: No follow-ups on file.   Waddell CHRISTELLA Craze, PA-C  Note - This record has been created using Dragon software.  Chart creation errors have been sought, but may not always  have been located. Such creation errors do not reflect on  the standard of medical care.     [1]  Social History Tobacco Use   Smoking status: Never    Passive exposure: Never   Smokeless tobacco: Never  Vaping Use   Vaping status: Never Used  Substance Use Topics   Alcohol use: Not Currently    Comment:  social   Drug use: Never   "

## 2024-11-06 ENCOUNTER — Ambulatory Visit: Admitting: Physician Assistant

## 2024-11-06 DIAGNOSIS — H209 Unspecified iridocyclitis: Secondary | ICD-10-CM

## 2024-11-06 DIAGNOSIS — G8929 Other chronic pain: Secondary | ICD-10-CM

## 2024-11-06 DIAGNOSIS — M19041 Primary osteoarthritis, right hand: Secondary | ICD-10-CM

## 2024-11-06 DIAGNOSIS — Z96651 Presence of right artificial knee joint: Secondary | ICD-10-CM

## 2024-11-06 DIAGNOSIS — Z79899 Other long term (current) drug therapy: Secondary | ICD-10-CM

## 2024-11-06 DIAGNOSIS — E785 Hyperlipidemia, unspecified: Secondary | ICD-10-CM

## 2024-11-06 DIAGNOSIS — J90 Pleural effusion, not elsewhere classified: Secondary | ICD-10-CM

## 2024-11-06 DIAGNOSIS — I10 Essential (primary) hypertension: Secondary | ICD-10-CM

## 2024-11-06 DIAGNOSIS — M0579 Rheumatoid arthritis with rheumatoid factor of multiple sites without organ or systems involvement: Secondary | ICD-10-CM

## 2024-11-06 DIAGNOSIS — Z227 Latent tuberculosis: Secondary | ICD-10-CM

## 2024-11-06 DIAGNOSIS — M1712 Unilateral primary osteoarthritis, left knee: Secondary | ICD-10-CM

## 2024-11-07 ENCOUNTER — Ambulatory Visit: Admitting: Physician Assistant

## 2024-11-12 ENCOUNTER — Ambulatory Visit: Admitting: Cardiology

## 2024-11-19 ENCOUNTER — Ambulatory Visit

## 2024-11-25 ENCOUNTER — Ambulatory Visit: Payer: Self-pay | Admitting: Internal Medicine

## 2025-02-23 ENCOUNTER — Ambulatory Visit: Admitting: Neurology

## 2025-03-02 ENCOUNTER — Ambulatory Visit
